# Patient Record
Sex: Male | Born: 1945 | Race: Black or African American | Hispanic: No | Marital: Married | Smoking: Former smoker
Health system: Southern US, Community
[De-identification: ages and names within clinical notes are randomized; demographics above are authoritative.]

## PROBLEM LIST (undated history)

## (undated) DIAGNOSIS — Z89511 Acquired absence of right leg below knee: Secondary | ICD-10-CM

## (undated) DIAGNOSIS — I639 Cerebral infarction, unspecified: Secondary | ICD-10-CM

## (undated) DIAGNOSIS — I10 Essential (primary) hypertension: Secondary | ICD-10-CM

## (undated) DIAGNOSIS — E119 Type 2 diabetes mellitus without complications: Secondary | ICD-10-CM

## (undated) DIAGNOSIS — N400 Enlarged prostate without lower urinary tract symptoms: Secondary | ICD-10-CM

## (undated) DIAGNOSIS — I4892 Unspecified atrial flutter: Secondary | ICD-10-CM

## (undated) DIAGNOSIS — F32A Depression, unspecified: Secondary | ICD-10-CM

## (undated) DIAGNOSIS — N183 Chronic kidney disease, stage 3 unspecified: Secondary | ICD-10-CM

## (undated) DIAGNOSIS — M1990 Unspecified osteoarthritis, unspecified site: Secondary | ICD-10-CM

## (undated) DIAGNOSIS — I499 Cardiac arrhythmia, unspecified: Secondary | ICD-10-CM

## (undated) DIAGNOSIS — C61 Malignant neoplasm of prostate: Secondary | ICD-10-CM

## (undated) DIAGNOSIS — E785 Hyperlipidemia, unspecified: Secondary | ICD-10-CM

## (undated) DIAGNOSIS — C189 Malignant neoplasm of colon, unspecified: Secondary | ICD-10-CM

## (undated) HISTORY — PX: APPENDECTOMY: SHX54

## (undated) HISTORY — PX: MINOR HEMORRHOIDECTOMY: SHX6238

---

## 2020-10-28 ENCOUNTER — Ambulatory Visit (INDEPENDENT_AMBULATORY_CARE_PROVIDER_SITE_OTHER): Payer: Medicare HMO

## 2020-10-28 ENCOUNTER — Ambulatory Visit (HOSPITAL_COMMUNITY)
Admission: EM | Admit: 2020-10-28 | Discharge: 2020-10-28 | Disposition: A | Discharge status: Home / Self Care | Payer: Medicare HMO | Attending: Physician Assistant | Admitting: Physician Assistant

## 2020-10-28 ENCOUNTER — Encounter (HOSPITAL_COMMUNITY): Payer: Self-pay | Admitting: Physician Assistant

## 2020-10-28 DIAGNOSIS — M25571 Pain in right ankle and joints of right foot: Secondary | ICD-10-CM

## 2020-10-28 MED ORDER — DICLOFENAC SODIUM 1 % EX GEL: 2.0000 g | Freq: Four times a day (QID) | CUTANEOUS | 0 refills | Status: DC

## 2020-10-28 NOTE — ED Triage Notes (Signed)
Pt presents with ankle pain and swelling X 4 days. Pt states he woke up one morning with pain in his ankle. He states he has had relief since applying ice.

## 2020-10-28 NOTE — Discharge Instructions (Addendum)
Follow up with PCP Use cream as directed Apply ice 15 minutes 4 times per day Stretch ankle daily.

## 2020-10-28 NOTE — ED Provider Notes (Signed)
[facility]    CSN: [id number] Arrival date & time: 10/28/20  1135      History   Chief Complaint Chief Complaint  Patient presents with   Ankle Pain    HPI Cory [name] is a 75 y.o. male.   Patient here c/w R ankle pain. PMH DM. He states it started after doing squats 2 days in a row.  No trauma to area.  Admits pain, tenderness, swelling, denies ecchymosis, n/t, weakness.  Denies history of gout.   History reviewed. No pertinent past medical history.  There are no problems to display for this patient.   History reviewed. No pertinent surgical history.     Home Medications    Prior to Admission medications   Medication Sig Start Date End Date Taking? Authorizing Provider  diclofenac Sodium (VOLTAREN) 1 % GEL Apply 2 g topically 4 (four) times daily. 10/28/20  Yes Evern Core, PA-C    Family History History reviewed. No pertinent family history.  Social History     Allergies   Patient has no known allergies.   Review of Systems Review of Systems  Constitutional:  Negative for chills, fatigue and fever.  Musculoskeletal:  Positive for arthralgias, gait problem, joint swelling and myalgias.  Skin:  Negative for color change, rash and wound.  Hematological:  Negative for adenopathy. Does not bruise/bleed easily.  Psychiatric/Behavioral:  Negative for confusion and sleep disturbance.     Physical Exam Triage Vital Signs ED Triage Vitals  Enc Vitals Group     BP 10/28/20 1235 (!) 147/88     Pulse Rate 10/28/20 1234 77     Resp 10/28/20 1234 17     Temp 10/28/20 1234 98.2 F (36.8 C)     Temp Source 10/28/20 1234 Oral     SpO2 10/28/20 1234 99 %     Weight --      Height --      Head Circumference --      Peak Flow --      Pain Score 10/28/20 1231 8     Pain Loc --      Pain Edu? --      Excl. in GC? --    No data found.  Updated Vital Signs BP (!) 147/88 (BP Location: Left Arm)   Pulse 77   Temp 98.2 F (36.8 C) (Oral)    Resp 17   SpO2 99%   Visual Acuity Right Eye Distance:   Left Eye Distance:   Bilateral Distance:    Right Eye Near:   Left Eye Near:    Bilateral Near:     Physical Exam Vitals and nursing note reviewed.  Constitutional:      Appearance: Normal appearance. He is well-developed.  HENT:     Head: Normocephalic and atraumatic.     Nose: Nose normal.     Mouth/Throat:     Mouth: Mucous membranes are moist.  Eyes:     General: No scleral icterus.    Extraocular Movements: Extraocular movements intact.     Conjunctiva/sclera: Conjunctivae normal.  Pulmonary:     Effort: Pulmonary effort is normal. No respiratory distress.  Musculoskeletal:     Cervical back: Normal range of motion and neck supple. No rigidity.     Right ankle: Swelling present. Tenderness present. Decreased range of motion.  Skin:    General: Skin is warm and dry.     Capillary Refill: Capillary refill takes less than 2 seconds.  Neurological:  General: No focal deficit present.     Mental Status: He is alert and oriented to person, place, and time.     Motor: No weakness.     Gait: Gait normal.  Psychiatric:        Mood and Affect: Mood normal.     UC Treatments / Results  Labs (all labs ordered are listed, but only abnormal results are displayed) Labs Reviewed - No data to display  EKG   Radiology DG Ankle Complete Right  Result Date: 10/28/2020 CLINICAL DATA:  Ankle pain, lateral aspect. Additional history provided: No injury, right ankle swelling and lateral pain for 4 days. EXAM: RIGHT ANKLE - COMPLETE 3+ VIEW COMPARISON:  No pertinent prior exams available for comparison. FINDINGS: There is normal bony alignment. No evidence of acute osseous or articular abnormality. The joint spaces are maintained. Calcaneal spur at the Achilles tendon insertion. Soft tissue swelling overlies the lateral malleolus. IMPRESSION: No evidence of acute osseous or articular abnormality. Soft tissue swelling  overlies the lateral malleolus. Electronically Signed   By: Jackey Loge DO   On: 10/28/2020 13:44    Procedures Procedures (including critical care time)  Medications Ordered in UC Medications - No data to display  Initial Impression / Assessment and Plan / UC Course  I have reviewed the triage vital signs and the nursing notes.  Pertinent labs & imaging results that were available during my care of the patient were reviewed by me and considered in my medical decision making (see chart for details).     Use ace wrap Use cream as directed Icee ankle 15 minutes 4 times per day  Final Clinical Impressions(s) / UC Diagnoses   Final diagnoses:  Acute right ankle pain     Discharge Instructions      Follow up with PCP Use cream as directed Apply ice 15 minutes 4 times per day Stretch ankle daily.     ED Prescriptions     Medication Sig Dispense Auth. Provider   diclofenac Sodium (VOLTAREN) 1 % GEL Apply 2 g topically 4 (four) times daily. 100 g Evern Core, PA-C      PDMP not reviewed this encounter.   Evern Core, PA-C 10/28/20 1407

## 2020-11-02 ENCOUNTER — Other Ambulatory Visit: Payer: Self-pay

## 2020-11-02 MED ORDER — TAMSULOSIN HCL 0.4 MG PO CAPS
ORAL_CAPSULE | ORAL | 3 refills | Status: DC
  Filled 2020-11-02: qty 30, 30d supply, fill #0

## 2020-11-02 MED ORDER — GLUCOSE BLOOD VI STRP
ORAL_STRIP | 12 refills | Status: DC
  Filled 2020-11-02 – 2020-11-03 (×2): qty 100, 90d supply, fill #0

## 2020-11-02 MED ORDER — BD PEN NEEDLE MINI U/F 31G X 5 MM MISC
3 refills | Status: DC
  Filled 2020-11-02: qty 100, 90d supply, fill #0

## 2020-11-02 MED ORDER — BLOOD GLUCOSE MONITOR SYSTEM W/DEVICE KIT
PACK | 0 refills | Status: DC
  Filled 2020-11-02: qty 1, 1d supply, fill #0

## 2020-11-02 MED ORDER — ACCU-CHEK SOFTCLIX LANCETS MISC
3 refills | Status: DC
  Filled 2020-11-02: qty 100, 90d supply, fill #0

## 2020-11-02 MED ORDER — FARXIGA 10 MG PO TABS
10.0000 mg | ORAL_TABLET | Freq: Every morning | ORAL | 1 refills | Status: DC
  Filled 2020-11-02: qty 30, 30d supply, fill #0

## 2020-11-02 MED ORDER — TRULICITY 0.75 MG/0.5ML ~~LOC~~ SOAJ
SUBCUTANEOUS | 0 refills | Status: DC
  Filled 2020-11-02: qty 2, 28d supply, fill #0

## 2020-11-03 ENCOUNTER — Other Ambulatory Visit: Payer: Self-pay

## 2020-11-12 ENCOUNTER — Other Ambulatory Visit: Payer: Self-pay

## 2020-11-12 ENCOUNTER — Encounter (HOSPITAL_COMMUNITY): Payer: Self-pay

## 2020-11-12 ENCOUNTER — Ambulatory Visit (INDEPENDENT_AMBULATORY_CARE_PROVIDER_SITE_OTHER): Payer: Medicare HMO

## 2020-11-12 ENCOUNTER — Ambulatory Visit (HOSPITAL_COMMUNITY)
Admission: EM | Admit: 2020-11-12 | Discharge: 2020-11-12 | Disposition: A | Discharge status: Home / Self Care | Payer: Medicare HMO | Attending: Family Medicine | Admitting: Family Medicine

## 2020-11-12 DIAGNOSIS — M79671 Pain in right foot: Secondary | ICD-10-CM | POA: Diagnosis not present

## 2020-11-12 DIAGNOSIS — R600 Localized edema: Secondary | ICD-10-CM | POA: Diagnosis not present

## 2020-11-12 DIAGNOSIS — R030 Elevated blood-pressure reading, without diagnosis of hypertension: Secondary | ICD-10-CM | POA: Diagnosis not present

## 2020-11-12 DIAGNOSIS — M25571 Pain in right ankle and joints of right foot: Secondary | ICD-10-CM

## 2020-11-12 MED ORDER — MISC. DEVICES MISC: 0 refills | Status: DC

## 2020-11-12 MED ORDER — FUROSEMIDE 20 MG PO TABS: ORAL_TABLET | ORAL | 0 refills | Status: DC

## 2020-11-12 NOTE — Discharge Instructions (Addendum)
Swelling happens when fluid collects in small spaces around tissues and organs inside the body. Another word for swelling is "edema." Some common parts of the body where people can have swelling are the lower legs or hands. This typically is worse in the areas of the body that are closest to the ground (because of gravity)  Symptoms of swelling can include puffiness of the skin, which can cause the skin to look stretched and shiny. This often occurs with swelling in the lower legs and can be worse after you sit or stand for a long time.  Treatment of edema includes several components: treatment of the underlying cause (if possible), reducing the amount of salt (sodium) in your diet, and, in many cases, use of a medication called a diuretic to eliminate excess fluid. Using compression stockings and elevating the legs may also be recommended.   Your blood pressure was noted to be elevated during your visit today. If you are currently taking medication for high blood pressure, please ensure you are taking this as directed. If you do not have a history of high blood pressure and your blood pressure remains persistently elevated, you may need to begin taking a medication at some point. You may return here within the next few days to recheck if unable to see your primary care provider or if you do not have a one.  BP (!) 183/91 (BP Location: Left Arm)   Pulse 85   Temp 98.7 F (37.1 C) (Oral)   Resp 18   SpO2 95%   BP Readings from Last 3 Encounters:  11/12/20 (!) 183/91  10/28/20 (!) 147/88

## 2020-11-12 NOTE — ED Triage Notes (Signed)
Pt presents with left ankle and leg pain x 1 week.   States the sxs have not improved.

## 2020-11-12 NOTE — ED Notes (Signed)
Ultrasound appt scheduled for 11/12/2020 at 3:00pm.

## 2020-11-12 NOTE — ED Provider Notes (Signed)
[facility]   [id number] 11/12/20 Arrival Time: 1057  ASSESSMENT & PLAN:  1. Right foot pain   2. Acute right ankle pain   3. Edema of right lower extremity   4. Elevated blood pressure reading in office without diagnosis of hypertension    Hesitant about getting U/S to r/o DVT. Lower suspicion but I emphasized importance of ruling this out. Agrees but will need for tomorrow so he can arrange transportation. No empiric Lovenox desired. Vascular imaging appt scheduled for tomorrow. Time and directions given before discharge. No CP/SOB. Agrees to ED f/u should he experience CP/SOB.  Declines Rx for walker. Prefers crutches to assist ambulation. Trial of Lasix as below.  Meds ordered this encounter  Medications   furosemide (LASIX) 20 MG tablet    Sig: Take 1-2 tablets in the morning for your ankle and foot swelling.    Dispense:  10 tablet    Refill:  0   DISCONTD: Misc. Devices MISC    Sig: Please dispense a walker to assist with ambulation.  Diagnosis:  Right foot pain. Right ankle pain. Right lower extremity edema.    Dispense:  1 each    Refill:  0      Discharge Instructions      Swelling happens when fluid collects in small spaces around tissues and organs inside the body. Another word for swelling is "edema." Some common parts of the body where people can have swelling are the lower legs or hands. This typically is worse in the areas of the body that are closest to the ground (because of gravity)  Symptoms of swelling can include puffiness of the skin, which can cause the skin to look stretched and shiny. This often occurs with swelling in the lower legs and can be worse after you sit or stand for a long time.  Treatment of edema includes several components: treatment of the underlying cause (if possible), reducing the amount of salt (sodium) in your diet, and, in many cases, use of a medication called a diuretic to eliminate excess fluid. Using compression  stockings and elevating the legs may also be recommended.   Your blood pressure was noted to be elevated during your visit today. If you are currently taking medication for high blood pressure, please ensure you are taking this as directed. If you do not have a history of high blood pressure and your blood pressure remains persistently elevated, you may need to begin taking a medication at some point. You may return here within the next few days to recheck if unable to see your primary care provider or if you do not have a one.  BP (!) 183/91 (BP Location: Left Arm)   Pulse 85   Temp 98.7 F (37.1 C) (Oral)   Resp 18   SpO2 95%   BP Readings from Last 3 Encounters:  11/12/20 (!) 183/91  10/28/20 (!) 147/88        Reviewed expectations re: course of current medical issues. Questions answered. Outlined signs and symptoms indicating need for more acute intervention. Patient verbalized understanding. After Visit Summary given.   SUBJECTIVE:  History from: patient. Cory [name] is a 75 y.o. male who presents with complaint of R ankle and foot pain; past week; now with swelling of RLE. Painful. Interfering with ambulation. No injury/trauma. No new medication. No h/o similar.  Social History   Tobacco Use  Smoking Status Never  Smokeless Tobacco Never   Social History   Substance and Sexual  Activity  Alcohol Use None   Increased blood pressure noted today. Reports that he is not treated for HTN. He reports no chest pain on exertion, no dyspnea on exertion, no orthostatic dizziness or lightheadedness, no orthopnea or paroxysmal nocturnal dyspnea, and no palpitations.    OBJECTIVE:  Vitals:   11/12/20 1249  BP: (!) 183/91  Pulse: 85  Resp: 18  Temp: 98.7 F (37.1 C)  TempSrc: Oral  SpO2: 95%    General appearance: alert, oriented, no acute distress Eyes: PERRLA; EOMI; conjunctivae normal HENT: normocephalic; atraumatic Neck: supple with FROM Lungs: without  labored respirations; speaks full sentences without difficulty Heart: regular Extremities: with edema of RLE, mostly around ankle foot but does extend proximally just above ankle; with TTP of calf and around ankle/foot Skin: warm and dry; without rash or lesions Psychological: alert and cooperative; normal mood and affect   Imaging: DG Ankle Complete Right  Result Date: 11/12/2020 CLINICAL DATA:  pain and swelling x 3 w EXAM: RIGHT ANKLE - COMPLETE 3+ VIEW COMPARISON:  Ankle radiograph 10/28/2020 FINDINGS: There is no evidence of acute fracture. There is ankle soft tissue swelling most prominent laterally. There is a small Achilles enthesophyte. IMPRESSION: Ankle soft tissue swelling most prominent laterally. No visible acute fracture. Electronically Signed   By: Caprice Renshaw   On: 11/12/2020 13:48   DG Foot Complete Right  Result Date: 11/12/2020 CLINICAL DATA:  Pain and swelling. EXAM: RIGHT FOOT COMPLETE - 3+ VIEW COMPARISON:  No prior. FINDINGS: Diffuse soft tissue swelling. No radiopaque foreign body. No acute bony or joint abnormality. No evidence of fracture or dislocation. IMPRESSION: Diffuse soft tissue swelling. No radiopaque foreign body. No acute bony abnormality. Electronically Signed   By: Maisie Fus  Register   On: 11/12/2020 13:48     No Known Allergies  History reviewed. No pertinent past medical history. Social History   Socioeconomic History   Marital status: Married    Spouse name: Not on file   Number of children: Not on file   Years of education: Not on file   Highest education level: Not on file  Occupational History   Not on file  Tobacco Use   Smoking status: Never   Smokeless tobacco: Never  Substance and Sexual Activity   Alcohol use: Not on file   Drug use: Not on file   Sexual activity: Not on file  Other Topics Concern   Not on file  Social History Narrative   Not on file   Social Determinants of Health   Financial Resource Strain: Not on file  Food  Insecurity: Not on file  Transportation Needs: Not on file  Physical Activity: Not on file  Stress: Not on file  Social Connections: Not on file  Intimate Partner Violence: Not on file   History reviewed. No pertinent family history. History reviewed. No pertinent surgical history.    Mardella Layman, MD 11/12/20 1434

## 2020-11-13 ENCOUNTER — Ambulatory Visit (HOSPITAL_COMMUNITY)
Admission: RE | Admit: 2020-11-13 | Discharge: 2020-11-13 | Disposition: A | Discharge status: Home / Self Care | Payer: Medicare HMO | Source: Ambulatory Visit | Attending: Family Medicine | Admitting: Family Medicine

## 2020-11-13 DIAGNOSIS — M79606 Pain in leg, unspecified: Secondary | ICD-10-CM | POA: Diagnosis present

## 2020-11-13 DIAGNOSIS — M79661 Pain in right lower leg: Secondary | ICD-10-CM | POA: Diagnosis not present

## 2020-11-13 DIAGNOSIS — M109 Gout, unspecified: Secondary | ICD-10-CM

## 2020-11-13 DIAGNOSIS — M7989 Other specified soft tissue disorders: Secondary | ICD-10-CM | POA: Diagnosis not present

## 2020-11-13 NOTE — Progress Notes (Signed)
Lower extremity venous has been completed.   Preliminary results in CV Proc.   Blanch Media 11/13/2020 3:54 PM

## 2020-11-17 ENCOUNTER — Emergency Department (HOSPITAL_COMMUNITY)
Admission: EM | Admit: 2020-11-17 | Discharge: 2020-11-18 | Disposition: A | Discharge status: Home / Self Care | Payer: Medicare HMO | Attending: Emergency Medicine | Admitting: Emergency Medicine

## 2020-11-17 DIAGNOSIS — M10071 Idiopathic gout, right ankle and foot: Secondary | ICD-10-CM | POA: Insufficient documentation

## 2020-11-17 DIAGNOSIS — Z79899 Other long term (current) drug therapy: Secondary | ICD-10-CM | POA: Insufficient documentation

## 2020-11-17 DIAGNOSIS — Z7984 Long term (current) use of oral hypoglycemic drugs: Secondary | ICD-10-CM | POA: Diagnosis not present

## 2020-11-17 DIAGNOSIS — E119 Type 2 diabetes mellitus without complications: Secondary | ICD-10-CM | POA: Diagnosis not present

## 2020-11-17 DIAGNOSIS — M109 Gout, unspecified: Secondary | ICD-10-CM

## 2020-11-17 DIAGNOSIS — I10 Essential (primary) hypertension: Secondary | ICD-10-CM | POA: Diagnosis not present

## 2020-11-17 DIAGNOSIS — Z794 Long term (current) use of insulin: Secondary | ICD-10-CM | POA: Diagnosis not present

## 2020-11-17 DIAGNOSIS — M25571 Pain in right ankle and joints of right foot: Secondary | ICD-10-CM | POA: Diagnosis present

## 2020-11-18 ENCOUNTER — Other Ambulatory Visit: Payer: Self-pay

## 2020-11-18 ENCOUNTER — Encounter (HOSPITAL_COMMUNITY): Payer: Self-pay | Admitting: Emergency Medicine

## 2020-11-18 LAB — CBC WITH DIFFERENTIAL/PLATELET
Abs Immature Granulocytes: 0.03 10*3/uL (ref 0.00–0.07)
Basophils Absolute: 0 10*3/uL (ref 0.0–0.1)
Basophils Relative: 0 %
Eosinophils Absolute: 0 10*3/uL (ref 0.0–0.5)
Eosinophils Relative: 1 %
HCT: 38.4 % — ABNORMAL LOW (ref 39.0–52.0)
Hemoglobin: 13.1 g/dL (ref 13.0–17.0)
Immature Granulocytes: 0 %
Lymphocytes Relative: 15 %
Lymphs Abs: 1.2 10*3/uL (ref 0.7–4.0)
MCH: 27.8 pg (ref 26.0–34.0)
MCHC: 34.1 g/dL (ref 30.0–36.0)
MCV: 81.4 fL (ref 80.0–100.0)
Monocytes Absolute: 0.9 10*3/uL (ref 0.1–1.0)
Monocytes Relative: 11 %
Neutro Abs: 5.9 10*3/uL (ref 1.7–7.7)
Neutrophils Relative %: 73 %
Platelets: 221 10*3/uL (ref 150–400)
RBC: 4.72 MIL/uL (ref 4.22–5.81)
RDW: 12.1 % (ref 11.5–15.5)
WBC: 8.1 10*3/uL (ref 4.0–10.5)
nRBC: 0 % (ref 0.0–0.2)

## 2020-11-18 LAB — BASIC METABOLIC PANEL
Anion gap: 15 (ref 5–15)
BUN: 16 mg/dL (ref 8–23)
CO2: 25 mmol/L (ref 22–32)
Calcium: 9 mg/dL (ref 8.9–10.3)
Chloride: 94 mmol/L — ABNORMAL LOW (ref 98–111)
Creatinine, Ser: 0.89 mg/dL (ref 0.61–1.24)
GFR, Estimated: >60 mL/min (ref >60)
Glucose, Bld: 170 mg/dL — ABNORMAL HIGH (ref 70–99)
Potassium: 3.2 mmol/L — ABNORMAL LOW (ref 3.5–5.1)
Sodium: 134 mmol/L — ABNORMAL LOW (ref 135–145)

## 2020-11-18 LAB — URIC ACID: Uric Acid, Serum: 3.4 mg/dL — ABNORMAL LOW (ref 3.7–8.6)

## 2020-11-18 MED ORDER — DEXAMETHASONE SODIUM PHOSPHATE 10 MG/ML IJ SOLN
10.0000 mg | Freq: Once | INTRAMUSCULAR | Status: AC
  Administered 2020-11-18: 10 mg via INTRAMUSCULAR
  Filled 2020-11-18: qty 1

## 2020-11-18 MED ORDER — COLCHICINE 0.6 MG PO TABS: 0.6000 mg | ORAL_TABLET | Freq: Two times a day (BID) | ORAL | 0 refills | Status: DC

## 2020-11-18 MED ORDER — PREDNISONE 20 MG PO TABS: 40.0000 mg | ORAL_TABLET | Freq: Every day | ORAL | 0 refills | Status: DC

## 2020-11-18 NOTE — ED Triage Notes (Signed)
Patient here with continued right leg swelling.  He has had multiple visits for same, with no relief of swelling.  Patient now having pain when walking.  Patient states that he had a venous doppler and does not know the results.  The foot continues with swelling, tightness of the foot and ankle.

## 2020-11-18 NOTE — Discharge Instructions (Addendum)
Your symptoms are likely related to gout, please read the attached instructions  The medication called colchicine which I have prescribed is to be taken as follows: 2 pills right away, then 1 pill every 12 hours until the medication is gone or until your pain goes away.  This may cause diarrhea  The steroids that we will give you are not going to help with some of your pain and swelling, they may also cause your blood sugar to go elevated for the next few days so make sure that you take your medications exactly as prescribed and follow a strict diabetic diet.  Return to the emergency department for fevers or severe or worsening pain or swelling, you must have your family doctor recheck you within 3 days.

## 2020-11-18 NOTE — ED Provider Notes (Signed)
Emergency Medicine Provider Triage Evaluation Note  Cory [name] , a 75 y.o. male  was evaluated in triage.  Pt complains of right ankle pain for about a week.  Has had negative x-rays and negative DVT studies.  Symptoms started after doing squats.  States he can't walk.  Review of Systems  Positive: Ankle pain, gait problem Negative: Fever  Physical Exam  BP (!) 145/80 (BP Location: Right Arm)   Pulse (!) 102   Temp 98.8 F (37.1 C) (Oral)   Resp 18   SpO2 100%  Gen:   Awake, no distress   Resp:  Normal effort  MSK:   Swelling about right ankle  Other:    Medical Decision Making  Medically screening exam initiated at 2:01 AM.  Appropriate orders placed.  Cory [name] was informed that the remainder of the evaluation will be completed by another provider, this initial triage assessment does not replace that evaluation, and the importance of remaining in the ED until their evaluation is complete.  Right ankle pain.   Roxy Horseman, PA-C 11/18/20 Herold Harms    Dione Booze, MD 11/18/20 [phone]

## 2020-11-18 NOTE — ED Provider Notes (Signed)
[facility] EMERGENCY DEPARTMENT Provider Note   CSN: [id number] Arrival date & time: 11/17/20  2200     History Chief Complaint  Patient presents with   Leg Swelling    Cory [name] is a 75 y.o. male.  HPI  This patient is a 75 year old male, he has a history of hypertension for which he takes multiple medications, he has diabetes taking metformin and Trulicity, he also takes atorvastatin.  According to the medical record the patient has had multiple visits for pain in his right ankle including urgent care on 22 June, on July 7 he was also seen in urgent care for the same thing, it has not gotten any better and he remains in discomfort and presents again today.  He had x-rays performed on July 7 showing some lateral soft tissue swelling, there is no fractures of the ankle or the foot.  A vascular ultrasound performed on July 9 was also obtained showing no evidence of DVT on the right and no common femoral vein obstruction on the left.  The pain continues he is unable to ambulate, he states it is swollen tender to the touch and tender with movement.  He denies fevers chills nausea vomiting diarrhea coughing shortness of breath chest pain abdominal pain or any other complaints.  He reports he has been taking no medications for this prior to arrival.  He tells me that the history behind this is that about a month ago he had been doing his evening exercises which she does every night including some squats, he was doing totally normal but when he woke up the next morning he had developed a pain overnight in his right ankle, it is only worsened since that time with warmth of the joint, no other joints are involved.  History reviewed. No pertinent past medical history.  There are no problems to display for this patient.   History reviewed. No pertinent surgical history.     History reviewed. No pertinent family history.  Social History   Tobacco Use   Smoking status:  Never   Smokeless tobacco: Never    Home Medications Prior to Admission medications   Medication Sig Start Date End Date Taking? Authorizing Provider  Accu-Chek Softclix Lancets lancets USE AS directed to test blood sugar daily 11/02/20  Yes   acetaminophen (TYLENOL) 500 MG tablet Take 1,000 mg by mouth every 6 (six) hours as needed for moderate pain or headache.   Yes [provider]  atorvastatin (LIPITOR) 20 MG tablet Take 20 mg by mouth at bedtime. 09/14/20  Yes [provider]  Blood Glucose Monitoring Suppl (BLOOD GLUCOSE MONITOR SYSTEM) w/Device KIT 1 (one) Kit as directed 11/02/20  Yes   colchicine 0.6 MG tablet Take 1 tablet (0.6 mg total) by mouth 2 (two) times daily for 7 days. 11/18/20 11/25/20 Yes Noemi Chapel, MD  dapagliflozin propanediol (FARXIGA) 10 MG TABS tablet Take 1 tablet by mouth every morning 11/02/20  Yes   Dulaglutide (TRULICITY) 7.82 NF/6.2ZH SOPN 1 (one) Solution Pen-injector under skin once a week Patient taking differently: Inject 0.75 mg into the skin every Monday. 11/02/20  Yes   glucose blood test strip use as directed to test blood sugar daily 11/02/20  Yes   Insulin Pen Needle (B-D UF III MINI PEN NEEDLES) 31G X 5 MM MISC 1 (one) Each daily 11/02/20  Yes   LANTUS SOLOSTAR 100 UNIT/ML Solostar Pen Inject 45 Units into the skin daily. 11/11/20  Yes [provider]  losartan-hydrochlorothiazide (HYZAAR) 100-25 MG tablet Take 1 tablet by mouth daily. 09/14/20  Yes [provider]  metFORMIN (GLUCOPHAGE) 1000 MG tablet Take 1,000 mg by mouth 2 (two) times daily. 06/15/20  Yes [provider]  metoprolol succinate (TOPROL-XL) 100 MG 24 hr tablet Take 100 mg by mouth daily. 11/09/20  Yes [provider]  NIFEdipine (PROCARDIA-XL/NIFEDICAL-XL) 30 MG 24 hr tablet Take 30 mg by mouth daily. 09/16/20  Yes [provider]  predniSONE (DELTASONE) 20 MG tablet Take 2 tablets (40 mg total) by mouth daily. 11/18/20  Yes Noemi Chapel, MD  tamsulosin (FLOMAX) 0.4 MG CAPS capsule 1 Capsule by mouth daily Patient taking differently: Take 0.4 mg by mouth at bedtime. 11/02/20  Yes   diclofenac Sodium (VOLTAREN) 1 % GEL Apply 2 g topically 4 (four) times daily. Patient not taking: No sig reported 10/28/20   Peri Jefferson, PA-C  furosemide (LASIX) 20 MG tablet Take 1-2 tablets in the morning for your ankle and foot swelling. Patient not taking: No sig reported 11/12/20   Vanessa Kick, MD    Allergies    Patient has no known allergies.  Review of Systems   Review of Systems  All other systems reviewed and are negative.  Physical Exam Updated Vital Signs BP (!) 156/98   Pulse 95   Temp 98.9 F (37.2 C) (Oral)   Resp 17   SpO2 98%   Physical Exam Vitals and nursing note reviewed.  Constitutional:      General: He is not in acute distress.    Appearance: He is well-developed.  HENT:     Head: Normocephalic and atraumatic.     Mouth/Throat:     Pharynx: No oropharyngeal exudate.  Eyes:     General: No scleral icterus.       Right eye: No discharge.        Left eye: No discharge.     Conjunctiva/sclera: Conjunctivae normal.     Pupils: Pupils are equal, round, and reactive to light.  Neck:     Thyroid: No thyromegaly.     Vascular: No JVD.  Cardiovascular:     Rate and Rhythm: Normal rate and regular rhythm.     Heart sounds: Normal heart sounds. No murmur heard.   No friction rub. No gallop.  Pulmonary:     Effort: Pulmonary effort is normal. No respiratory distress.     Breath sounds: Normal breath sounds. No wheezing or rales.  Abdominal:     General: Bowel sounds are normal. There is no distension.     Palpations: Abdomen is soft. There is no mass.     Tenderness: There is no abdominal tenderness.  Musculoskeletal:        General: Swelling and tenderness present.     Cervical back: Normal range of motion and neck supple.     Comments: The patient has decreased range of motion of the right  ankle secondary to pain, there is tenderness with any movement of the right ankle joint, he has normal pulses at the feet bilaterally.  All other extremities have a totally normal exam.  The right leg has normal range of motion at the right hip and the right knee, the right ankle has decreased range of motion.  He is able to move all 5 of the toes of the right foot without difficulty.  The right ankle is slightly swollen and warm to the touch, there is no overlying rash  Lymphadenopathy:     Cervical:  No cervical adenopathy.  Skin:    General: Skin is warm and dry.     Findings: No erythema or rash.  Neurological:     Mental Status: He is alert.     Coordination: Coordination normal.  Psychiatric:        Behavior: Behavior normal.    ED Results / Procedures / Treatments   Labs (all labs ordered are listed, but only abnormal results are displayed) Labs Reviewed  URIC ACID - Abnormal; Notable for the following components:      Result Value   Uric Acid, Serum 3.4 (*)    All other components within normal limits  CBC WITH DIFFERENTIAL/PLATELET - Abnormal; Notable for the following components:   HCT 38.4 (*)    All other components within normal limits  BASIC METABOLIC PANEL - Abnormal; Notable for the following components:   Sodium 134 (*)    Potassium 3.2 (*)    Chloride 94 (*)    Glucose, Bld 170 (*)    All other components within normal limits    EKG None  Radiology No results found.  Procedures Procedures   Medications Ordered in ED Medications  dexamethasone (DECADRON) injection 10 mg (has no administration in time range)    ED Course  I have reviewed the triage vital signs and the nursing notes.  Pertinent labs & imaging results that were available during my care of the patient were reviewed by me and considered in my medical decision making (see chart for details).    MDM Rules/Calculators/A&P                          The patient has an acute atraumatic  monoarticular arthritis, would consider several things in the differential including acute gout which is the most likely answer, this might also be related to a septic joint though this seems much less likely in the absence of fever tachycardia or leukocytosis.  The patient is a diabetic and unfortunately steroids may have some hyperglycemic effects but at this time with his significant swelling and inability to put any weight on it we will give him a course of steroids, he will also be placed on colchicine and will have him follow-up with family doctor.  Final Clinical Impression(s) / ED Diagnoses Final diagnoses:  Acute gout of right ankle, unspecified cause    Rx / DC Orders ED Discharge Orders          Ordered    colchicine 0.6 MG tablet  2 times daily        11/18/20 0744    predniSONE (DELTASONE) 20 MG tablet  Daily        11/18/20 0744             Noemi Chapel, MD 11/18/20 0745

## 2020-11-22 ENCOUNTER — Encounter (HOSPITAL_COMMUNITY): Payer: Self-pay | Admitting: Emergency Medicine

## 2020-11-22 ENCOUNTER — Emergency Department (HOSPITAL_COMMUNITY)
Admission: EM | Admit: 2020-11-22 | Discharge: 2020-11-22 | Disposition: A | Discharge status: Home / Self Care | Payer: Medicare HMO | Attending: Emergency Medicine | Admitting: Emergency Medicine

## 2020-11-22 ENCOUNTER — Emergency Department (HOSPITAL_COMMUNITY): Payer: Medicare HMO

## 2020-11-22 ENCOUNTER — Other Ambulatory Visit: Payer: Self-pay

## 2020-11-22 DIAGNOSIS — M25571 Pain in right ankle and joints of right foot: Secondary | ICD-10-CM | POA: Diagnosis present

## 2020-11-22 DIAGNOSIS — L03115 Cellulitis of right lower limb: Secondary | ICD-10-CM | POA: Diagnosis not present

## 2020-11-22 DIAGNOSIS — Z79899 Other long term (current) drug therapy: Secondary | ICD-10-CM | POA: Insufficient documentation

## 2020-11-22 DIAGNOSIS — Z794 Long term (current) use of insulin: Secondary | ICD-10-CM | POA: Diagnosis not present

## 2020-11-22 DIAGNOSIS — E119 Type 2 diabetes mellitus without complications: Secondary | ICD-10-CM | POA: Diagnosis not present

## 2020-11-22 DIAGNOSIS — I10 Essential (primary) hypertension: Secondary | ICD-10-CM | POA: Insufficient documentation

## 2020-11-22 LAB — COMPREHENSIVE METABOLIC PANEL
ALT: 17 U/L (ref 0–44)
AST: 16 U/L (ref 15–41)
Albumin: 3.1 g/dL — ABNORMAL LOW (ref 3.5–5.0)
Alkaline Phosphatase: 72 U/L (ref 38–126)
Anion gap: 8 (ref 5–15)
BUN: 16 mg/dL (ref 8–23)
CO2: 29 mmol/L (ref 22–32)
Calcium: 9.2 mg/dL (ref 8.9–10.3)
Chloride: 92 mmol/L — ABNORMAL LOW (ref 98–111)
Creatinine, Ser: 1.1 mg/dL (ref 0.61–1.24)
GFR, Estimated: >60 mL/min (ref >60)
Glucose, Bld: 326 mg/dL — ABNORMAL HIGH (ref 70–99)
Potassium: 4 mmol/L (ref 3.5–5.1)
Sodium: 129 mmol/L — ABNORMAL LOW (ref 135–145)
Total Bilirubin: 0.6 mg/dL (ref 0.3–1.2)
Total Protein: 8.1 g/dL (ref 6.5–8.1)

## 2020-11-22 LAB — CBC WITH DIFFERENTIAL/PLATELET
Abs Immature Granulocytes: 0.07 10*3/uL (ref 0.00–0.07)
Basophils Absolute: 0 10*3/uL (ref 0.0–0.1)
Basophils Relative: 0 %
Eosinophils Absolute: 0 10*3/uL (ref 0.0–0.5)
Eosinophils Relative: 0 %
HCT: 42.5 % (ref 39.0–52.0)
Hemoglobin: 14.4 g/dL (ref 13.0–17.0)
Immature Granulocytes: 1 %
Lymphocytes Relative: 12 %
Lymphs Abs: 1.1 10*3/uL (ref 0.7–4.0)
MCH: 27.5 pg (ref 26.0–34.0)
MCHC: 33.9 g/dL (ref 30.0–36.0)
MCV: 81.3 fL (ref 80.0–100.0)
Monocytes Absolute: 1 10*3/uL (ref 0.1–1.0)
Monocytes Relative: 11 %
Neutro Abs: 7 10*3/uL (ref 1.7–7.7)
Neutrophils Relative %: 76 %
Platelets: 251 10*3/uL (ref 150–400)
RBC: 5.23 MIL/uL (ref 4.22–5.81)
RDW: 12 % (ref 11.5–15.5)
WBC: 9.2 10*3/uL (ref 4.0–10.5)
nRBC: 0 % (ref 0.0–0.2)

## 2020-11-22 LAB — URIC ACID: Uric Acid, Serum: 3.2 mg/dL — ABNORMAL LOW (ref 3.7–8.6)

## 2020-11-22 LAB — SEDIMENTATION RATE: Sed Rate: 57 mm/hr — ABNORMAL HIGH (ref 0–16)

## 2020-11-22 LAB — C-REACTIVE PROTEIN: CRP: 8.7 mg/dL — ABNORMAL HIGH (ref <1.0)

## 2020-11-22 MED ORDER — MORPHINE SULFATE (PF) 4 MG/ML IV SOLN
4.0000 mg | Freq: Once | INTRAVENOUS | Status: AC
  Administered 2020-11-22: 4 mg via INTRAVENOUS
  Filled 2020-11-22: qty 1

## 2020-11-22 MED ORDER — HYDROCODONE-ACETAMINOPHEN 5-325 MG PO TABS: 1.0000 | ORAL_TABLET | Freq: Four times a day (QID) | ORAL | 0 refills | Status: DC | PRN

## 2020-11-22 MED ORDER — IOHEXOL 300 MG/ML  SOLN: 100.0000 mL | Freq: Once | INTRAMUSCULAR | Status: DC | PRN

## 2020-11-22 MED ORDER — SULFAMETHOXAZOLE-TRIMETHOPRIM 800-160 MG PO TABS: 1.0000 | ORAL_TABLET | Freq: Two times a day (BID) | ORAL | 0 refills | Status: DC

## 2020-11-22 MED ORDER — VANCOMYCIN HCL 1500 MG/300ML IV SOLN
1500.0000 mg | Freq: Once | INTRAVENOUS | Status: AC
  Administered 2020-11-22: 1500 mg via INTRAVENOUS
  Filled 2020-11-22: qty 300

## 2020-11-22 MED ORDER — LIDOCAINE HCL 2 % IJ SOLN
10.0000 mL | Freq: Once | INTRAMUSCULAR | Status: AC
  Administered 2020-11-22: 200 mg
  Filled 2020-11-22: qty 20

## 2020-11-22 MED ORDER — CEPHALEXIN 500 MG PO CAPS: 500.0000 mg | ORAL_CAPSULE | Freq: Three times a day (TID) | ORAL | 0 refills | Status: DC

## 2020-11-22 MED ORDER — IOHEXOL 300 MG/ML  SOLN
100.0000 mL | Freq: Once | INTRAMUSCULAR | Status: AC | PRN
  Administered 2020-11-22: 100 mL via INTRAVENOUS

## 2020-11-22 NOTE — ED Notes (Signed)
Pt states he was exercising around Father's day and he woke up the net day and his right ankle and foot was swollen. Pt has 2+ swelling of right ankle and foot, 2+ right pedal pulse, cap refill less than 3 sec, warm to touch, able to wiggle toes.

## 2020-11-22 NOTE — ED Provider Notes (Signed)
[facility] EMERGENCY DEPARTMENT Provider Note   CSN: [id number] Arrival date & time: 11/22/20  1505     History Chief Complaint  Patient presents with   Leg Pain    Cory [name] is a 75 y.o. male.   Leg Pain  75 year old male PMHx HTN, DM, presenting for right ankle pain.  Pain is located on diffuse ankle radiating up right calf, onset 1 month ago in context of exercising heavily the previous day, worsening, and now 10/10 severity, sharp quality, worsened with movement and standing.  Associate symptoms include overlying edema and warmth.  Has been using crutches to ambulate as he has difficulty bearing weight.  No further medical concerns time occluding fevers, chills, diaphoresis, CP, SOB, palpitations, cough, N/V, abdominal pain, bowel/bladder changes, focal paresthesias/weakness, syncope.  History obtained from patient and chart review.  History reviewed. No pertinent past medical history.  There are no problems to display for this patient.   History reviewed. No pertinent surgical history.     No family history on file.  Social History   Tobacco Use   Smoking status: Never   Smokeless tobacco: Never    Home Medications Prior to Admission medications   Medication Sig Start Date End Date Taking? Authorizing Provider  cephALEXin (KEFLEX) 500 MG capsule Take 1 capsule (500 mg total) by mouth 3 (three) times daily for 5 days. 11/22/20 11/27/20 Yes Kamiryn Bezanson, Hughes Better, MD  HYDROcodone-acetaminophen (NORCO/VICODIN) 5-325 MG tablet Take 1 tablet by mouth every 6 (six) hours as needed for up to 10 doses for severe pain. 11/22/20  Yes Vannary Greening, MD  sulfamethoxazole-trimethoprim (BACTRIM DS) 800-160 MG tablet Take 1 tablet by mouth 2 (two) times daily for 5 days. 11/22/20 11/27/20 Yes Jaylyn Booher, Hughes Better, MD  Accu-Chek Softclix Lancets lancets USE AS directed to test blood sugar daily 11/02/20     acetaminophen (TYLENOL) 500 MG tablet Take  1,000 mg by mouth every 6 (six) hours as needed for moderate pain or headache.    [provider]  atorvastatin (LIPITOR) 20 MG tablet Take 20 mg by mouth at bedtime. 09/14/20   [provider]  Blood Glucose Monitoring Suppl (BLOOD GLUCOSE MONITOR SYSTEM) w/Device KIT 1 (one) Kit as directed 11/02/20     colchicine 0.6 MG tablet Take 1 tablet (0.6 mg total) by mouth 2 (two) times daily for 7 days. 11/18/20 11/25/20  Noemi Chapel, MD  dapagliflozin propanediol (FARXIGA) 10 MG TABS tablet Take 1 tablet by mouth every morning 11/02/20     diclofenac Sodium (VOLTAREN) 1 % GEL Apply 2 g topically 4 (four) times daily. Patient not taking: No sig reported 10/28/20   Peri Jefferson, PA-C  Dulaglutide (TRULICITY) 6.71 IW/5.8KD SOPN 1 (one) Solution Pen-injector under skin once a week Patient taking differently: Inject 0.75 mg into the skin every Monday. 11/02/20     furosemide (LASIX) 20 MG tablet Take 1-2 tablets in the morning for your ankle and foot swelling. Patient not taking: No sig reported 11/12/20   Vanessa Kick, MD  glucose blood test strip use as directed to test blood sugar daily 11/02/20     Insulin Pen Needle (B-D UF III MINI PEN NEEDLES) 31G X 5 MM MISC 1 (one) Each daily 11/02/20     LANTUS SOLOSTAR 100 UNIT/ML Solostar Pen Inject 45 Units into the skin daily. 11/11/20   [provider]  losartan-hydrochlorothiazide (HYZAAR) 100-25 MG tablet Take 1 tablet by mouth daily. 09/14/20   [provider]  metFORMIN (GLUCOPHAGE) 1000 MG  tablet Take 1,000 mg by mouth 2 (two) times daily. 06/15/20   [provider]  metoprolol succinate (TOPROL-XL) 100 MG 24 hr tablet Take 100 mg by mouth daily. 11/09/20   [provider]  NIFEdipine (PROCARDIA-XL/NIFEDICAL-XL) 30 MG 24 hr tablet Take 30 mg by mouth daily. 09/16/20   [provider]  predniSONE (DELTASONE) 20 MG tablet Take 2 tablets (40 mg total) by mouth daily. 11/18/20   Noemi Chapel, MD  tamsulosin  (FLOMAX) 0.4 MG CAPS capsule 1 Capsule by mouth daily Patient taking differently: Take 0.4 mg by mouth at bedtime. 11/02/20       Allergies    Patient has no known allergies.  Review of Systems   Review of Systems  All other systems reviewed and are negative.  Physical Exam Updated Vital Signs BP (!) 129/113   Pulse 80   Temp 97.6 F (36.4 C) (Oral)   Resp 18   SpO2 98%   Physical Exam Vitals and nursing note reviewed.  Constitutional:      General: He is not in acute distress. HENT:     Head: Normocephalic and atraumatic.  Eyes:     Extraocular Movements: Extraocular movements intact.     Conjunctiva/sclera: Conjunctivae normal.  Cardiovascular:     Rate and Rhythm: Normal rate and regular rhythm.  Pulmonary:     Effort: Pulmonary effort is normal. No respiratory distress.  Abdominal:     Palpations: Abdomen is soft.     Tenderness: There is no abdominal tenderness.  Musculoskeletal:     Cervical back: Normal range of motion. No rigidity.     Left lower leg: No edema.     Comments: Left ankle diffusely edematous with possible posterior lateral effusion, warm to touch.  Significant TTP and with ranging.  No obvious erythema or induration.  Compartments soft.  No obvious ecchymosis, deformity, or crepitus.  Neurovascularly intact distally.  Skin:    General: Skin is warm and dry.  Neurological:     Mental Status: He is alert and oriented to person, place, and time. Mental status is at baseline.  Psychiatric:        Mood and Affect: Mood normal.        Behavior: Behavior normal.    ED Results / Procedures / Treatments   Labs (all labs ordered are listed, but only abnormal results are displayed) Labs Reviewed  COMPREHENSIVE METABOLIC PANEL - Abnormal; Notable for the following components:      Result Value   Sodium 129 (*)    Chloride 92 (*)    Glucose, Bld 326 (*)    Albumin 3.1 (*)    All other components within normal limits  SEDIMENTATION RATE - Abnormal;  Notable for the following components:   Sed Rate 57 (*)    All other components within normal limits  C-REACTIVE PROTEIN - Abnormal; Notable for the following components:   CRP 8.7 (*)    All other components within normal limits  URIC ACID - Abnormal; Notable for the following components:   Uric Acid, Serum 3.2 (*)    All other components within normal limits  BODY FLUID CULTURE W GRAM STAIN  CBC WITH DIFFERENTIAL/PLATELET    EKG None  Radiology CT ANKLE RIGHT W CONTRAST  Result Date: 11/22/2020 CLINICAL DATA:  Right ankle swelling EXAM: CT OF THE RIGHT ANKLE WITH CONTRAST TECHNIQUE: Multidetector CT imaging of the right ankle was performed following the standard protocol during bolus administration of intravenous contrast. CONTRAST:  100 cc Omnipaque 350 COMPARISON:  11/12/2020 FINDINGS: Bones/Joint/Cartilage No acute or destructive bony lesions. 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 Small superior calcaneal spur. Ligaments Suboptimally assessed by CT. Muscles and Tendons No gross abnormality. If there is suspicion for intrinsic muscular or tendinous pathology, follow-up MRI could be considered. Soft tissues There is diffuse subcutaneous edema about the right ankle and hindfoot. No evidence of fluid collection or abscess. Vascular structures appear to enhance normally. Reconstructed images demonstrate no additional findings. IMPRESSION: 1. Diffuse subcutaneous edema surrounding the right ankle and hindfoot, which could reflect cellulitis. No fluid collection or abscess. 2. No acute bony abnormality. 3. 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 Electronically Signed   By: Randa Ngo M.D.   On: 11/22/2020 20:50    Procedures .Joint Aspiration/Arthrocentesis  Date/Time: 11/22/2020 5:49 PM Performed by: Levin Bacon, MD Authorized by: Drenda Freeze, MD   Consent:    Consent obtained:  Verbal   Consent given by:  Patient   Risks, benefits, and  alternatives were discussed: yes     Risks discussed:  Bleeding, infection and pain   Alternatives discussed:  No treatment Universal protocol:    Procedure explained and questions answered to patient or proxy's satisfaction: yes     Patient identity confirmed:  Arm band Location:    Location:  Ankle   Ankle:  R ankle Anesthesia:    Anesthesia method:  Local infiltration   Local anesthetic:  Lidocaine 2% WITH epi Procedure details:    Preparation: Patient was prepped and draped in usual sterile fashion     Needle gauge:  18 G   Ultrasound guidance: yes     Approach:  Lateral   Aspirate amount:  0   Steroid injected: no     Specimen collected: no   Post-procedure details:    Dressing:  Gauze roll   Procedure completion:  Tolerated well, no immediate complications   Medications Ordered in ED Medications  lidocaine (XYLOCAINE) 2 % (with pres) injection 200 mg (200 mg Infiltration Given by Other 11/22/20 1900)  morphine 4 MG/ML injection 4 mg (4 mg Intravenous Given 11/22/20 1731)  vancomycin (VANCOREADY) IVPB 1500 mg/300 mL (0 mg Intravenous Stopped 11/22/20 2214)  iohexol (OMNIPAQUE) 300 MG/ML solution 100 mL (100 mLs Intravenous Contrast Given 11/22/20 1825)    ED Course  I have reviewed the triage vital signs and the nursing notes.  Pertinent labs & imaging results that were available during my care of the patient were reviewed by me and considered in my medical decision making (see chart for details).    MDM Rules/Calculators/A&P                          This is a 75 year old male PMHx T2DM with worsening ankle pain over the past month, now warm, tender, swollen with difficulty weightbearing.  VSS, AF.  He was evaluated recently, thought to have gout, and started on prednisone and colchicine.  Initial interventions: Morphine provided for pain control, attempted joint tap as above, but unable to draw back any aspirate  DDx included: Compartment syndrome, traumatic injury,  osteoarthritis, crystal arthropathy, septic arthritis, osteomyelitis, soft tissue infection, DVT, venous stasis, lymphedema, radiculopathy, neuropathy, metabolic derangement, malignancy, autoimmune disease  All studies independently reviewed by myself, d/w the attending physician, factored into my MDM. -CT right ankle with contrast: Diffuse subcutaneous edema surrounding right ankle and hindfoot likely reflecting cellulitis -CMP: Mild hyponatremia (129) in context of  BGL 326, normal anion gap -ESR: 57 -CRP: 8.7 -Uric acid: 3.2 (3.4) -Unremarkable: CBCd  Presentation appears most consistent with arthralgia with overlying cellulitis.  Mildly elevated inflammatory markers, will warm arthralgias, concerning for possible septic arthritis or rheumatologic disease, although no systemic signs or symptoms, no tappable joint effusion with regard to former.  Despite uric acid elevation, has failed treatment for gout. Compartments soft, no signs of trauma on exam.   No evidence of osteomyelitis, significant osteoarthritis, or changes consistent with malignancy on CT imaging.  Presentation inconsistent with DVT, venous stasis, lymphedema.  Unlikely neurologic etiology.  No significant metabolic derangements noted.  Potentially autoimmune disease in context of elevated inflammatory markers and localized swelling, although he has no prior history thereof.  Therefore, feel the patient stable for discharge home with close outpatient follow-up including instructions to schedule appoint with orthopedic surgery first thing Monday morning.  He was given 1 round of vancomycin in the ED and will send home on Keflex and Bactrim to treat his cellulitis.  Strict return precautions discussed.  He understands and agrees with this plan.  Patient anxious on reevaluation, ambulating well with crutches, nontoxic-appearing.  Subsequently discharged.  Final Clinical Impression(s) / ED Diagnoses Final diagnoses:  Cellulitis of right  lower extremity    Rx / DC Orders ED Discharge Orders          Ordered    HYDROcodone-acetaminophen (NORCO/VICODIN) 5-325 MG tablet  Every 6 hours PRN        11/22/20 2217    sulfamethoxazole-trimethoprim (BACTRIM DS) 800-160 MG tablet  2 times daily        11/22/20 2217    cephALEXin (KEFLEX) 500 MG capsule  3 times daily        11/22/20 2217             Levin Bacon, MD 11/23/20 0533    Drenda Freeze, MD 11/25/20 [phone]

## 2020-11-22 NOTE — ED Notes (Signed)
Patient transported to CT 

## 2020-11-22 NOTE — ED Triage Notes (Signed)
Patient coming from home. Complaint of leg pain and inability to walk for an extended period of time.

## 2020-11-22 NOTE — Discharge Instructions (Addendum)
Your evaluated in the ER for leg pain.  We performed a CT of your ankle that showed evidence of a skin infection.  She take the antibiotics as prescribed.  Please return to the ER for any worsening.  You should use the number above to follow-up with orthopedic surgery in the next week.  Call them tomorrow morning to set up an appointment.

## 2020-11-26 ENCOUNTER — Inpatient Hospital Stay (HOSPITAL_COMMUNITY)
Admission: EM | Admit: 2020-11-26 | Discharge: 2020-12-08 | DRG: 854 | Disposition: A | Discharge status: Skilled Nursing Facility | Payer: Medicare HMO | Attending: Student | Admitting: Student

## 2020-11-26 ENCOUNTER — Encounter (HOSPITAL_COMMUNITY): Payer: Self-pay | Admitting: Emergency Medicine

## 2020-11-26 ENCOUNTER — Emergency Department (HOSPITAL_COMMUNITY): Payer: Medicare HMO

## 2020-11-26 ENCOUNTER — Other Ambulatory Visit: Payer: Self-pay

## 2020-11-26 DIAGNOSIS — M86071 Acute hematogenous osteomyelitis, right ankle and foot: Secondary | ICD-10-CM | POA: Diagnosis not present

## 2020-11-26 DIAGNOSIS — E871 Hypo-osmolality and hyponatremia: Secondary | ICD-10-CM | POA: Diagnosis not present

## 2020-11-26 DIAGNOSIS — Z89511 Acquired absence of right leg below knee: Secondary | ICD-10-CM

## 2020-11-26 DIAGNOSIS — E785 Hyperlipidemia, unspecified: Secondary | ICD-10-CM | POA: Diagnosis present

## 2020-11-26 DIAGNOSIS — Z20822 Contact with and (suspected) exposure to covid-19: Secondary | ICD-10-CM | POA: Diagnosis not present

## 2020-11-26 DIAGNOSIS — Z794 Long term (current) use of insulin: Secondary | ICD-10-CM

## 2020-11-26 DIAGNOSIS — E440 Moderate protein-calorie malnutrition: Secondary | ICD-10-CM | POA: Diagnosis not present

## 2020-11-26 DIAGNOSIS — E663 Overweight: Secondary | ICD-10-CM | POA: Diagnosis present

## 2020-11-26 DIAGNOSIS — E876 Hypokalemia: Secondary | ICD-10-CM | POA: Diagnosis not present

## 2020-11-26 DIAGNOSIS — A419 Sepsis, unspecified organism: Secondary | ICD-10-CM | POA: Diagnosis not present

## 2020-11-26 DIAGNOSIS — L03115 Cellulitis of right lower limb: Secondary | ICD-10-CM | POA: Diagnosis not present

## 2020-11-26 DIAGNOSIS — N400 Enlarged prostate without lower urinary tract symptoms: Secondary | ICD-10-CM | POA: Diagnosis present

## 2020-11-26 DIAGNOSIS — I10 Essential (primary) hypertension: Secondary | ICD-10-CM | POA: Diagnosis present

## 2020-11-26 DIAGNOSIS — Z79899 Other long term (current) drug therapy: Secondary | ICD-10-CM | POA: Diagnosis not present

## 2020-11-26 DIAGNOSIS — M86271 Subacute osteomyelitis, right ankle and foot: Secondary | ICD-10-CM | POA: Diagnosis not present

## 2020-11-26 DIAGNOSIS — L0390 Cellulitis, unspecified: Secondary | ICD-10-CM | POA: Diagnosis not present

## 2020-11-26 DIAGNOSIS — R739 Hyperglycemia, unspecified: Secondary | ICD-10-CM

## 2020-11-26 DIAGNOSIS — N179 Acute kidney failure, unspecified: Secondary | ICD-10-CM | POA: Diagnosis not present

## 2020-11-26 DIAGNOSIS — E1140 Type 2 diabetes mellitus with diabetic neuropathy, unspecified: Secondary | ICD-10-CM | POA: Diagnosis present

## 2020-11-26 DIAGNOSIS — R6521 Severe sepsis with septic shock: Secondary | ICD-10-CM | POA: Diagnosis present

## 2020-11-26 DIAGNOSIS — L97419 Non-pressure chronic ulcer of right heel and midfoot with unspecified severity: Secondary | ICD-10-CM | POA: Diagnosis present

## 2020-11-26 DIAGNOSIS — Z6829 Body mass index (BMI) 29.0-29.9, adult: Secondary | ICD-10-CM | POA: Diagnosis not present

## 2020-11-26 DIAGNOSIS — E1169 Type 2 diabetes mellitus with other specified complication: Secondary | ICD-10-CM | POA: Diagnosis present

## 2020-11-26 DIAGNOSIS — Z833 Family history of diabetes mellitus: Secondary | ICD-10-CM | POA: Diagnosis not present

## 2020-11-26 DIAGNOSIS — Z7984 Long term (current) use of oral hypoglycemic drugs: Secondary | ICD-10-CM

## 2020-11-26 DIAGNOSIS — E7800 Pure hypercholesterolemia, unspecified: Secondary | ICD-10-CM | POA: Diagnosis present

## 2020-11-26 DIAGNOSIS — E1165 Type 2 diabetes mellitus with hyperglycemia: Secondary | ICD-10-CM | POA: Diagnosis not present

## 2020-11-26 DIAGNOSIS — E119 Type 2 diabetes mellitus without complications: Secondary | ICD-10-CM | POA: Insufficient documentation

## 2020-11-26 DIAGNOSIS — M869 Osteomyelitis, unspecified: Secondary | ICD-10-CM | POA: Diagnosis present

## 2020-11-26 DIAGNOSIS — E11621 Type 2 diabetes mellitus with foot ulcer: Secondary | ICD-10-CM | POA: Diagnosis present

## 2020-11-26 DIAGNOSIS — M86171 Other acute osteomyelitis, right ankle and foot: Secondary | ICD-10-CM | POA: Diagnosis present

## 2020-11-26 DIAGNOSIS — E11628 Type 2 diabetes mellitus with other skin complications: Secondary | ICD-10-CM | POA: Diagnosis not present

## 2020-11-26 DIAGNOSIS — M60073 Infective myositis, right foot: Secondary | ICD-10-CM | POA: Diagnosis not present

## 2020-11-26 HISTORY — DX: Hyperlipidemia, unspecified: E78.5

## 2020-11-26 HISTORY — DX: Type 2 diabetes mellitus without complications: E11.9

## 2020-11-26 HISTORY — DX: Essential (primary) hypertension: I10

## 2020-11-26 HISTORY — DX: Osteomyelitis, unspecified: M86.9

## 2020-11-26 LAB — CBC WITH DIFFERENTIAL/PLATELET
Abs Immature Granulocytes: 0.09 10*3/uL — ABNORMAL HIGH (ref 0.00–0.07)
Basophils Absolute: 0 10*3/uL (ref 0.0–0.1)
Basophils Relative: 0 %
Eosinophils Absolute: 0 10*3/uL (ref 0.0–0.5)
Eosinophils Relative: 0 %
HCT: 36.1 % — ABNORMAL LOW (ref 39.0–52.0)
Hemoglobin: 12.3 g/dL — ABNORMAL LOW (ref 13.0–17.0)
Immature Granulocytes: 1 %
Lymphocytes Relative: 3 %
Lymphs Abs: 0.4 10*3/uL — ABNORMAL LOW (ref 0.7–4.0)
MCH: 27.3 pg (ref 26.0–34.0)
MCHC: 34.1 g/dL (ref 30.0–36.0)
MCV: 80 fL (ref 80.0–100.0)
Monocytes Absolute: 1.2 10*3/uL — ABNORMAL HIGH (ref 0.1–1.0)
Monocytes Relative: 9 %
Neutro Abs: 11.7 10*3/uL — ABNORMAL HIGH (ref 1.7–7.7)
Neutrophils Relative %: 87 %
Platelets: 226 10*3/uL (ref 150–400)
RBC: 4.51 MIL/uL (ref 4.22–5.81)
RDW: 12.3 % (ref 11.5–15.5)
WBC: 13.4 10*3/uL — ABNORMAL HIGH (ref 4.0–10.5)
nRBC: 0 % (ref 0.0–0.2)

## 2020-11-26 LAB — RESP PANEL BY RT-PCR (FLU A&B, COVID) ARPGX2
Influenza A by PCR: NEGATIVE
Influenza B by PCR: NEGATIVE
SARS Coronavirus 2 by RT PCR: NEGATIVE

## 2020-11-26 LAB — SEDIMENTATION RATE: Sed Rate: 81 mm/hr — ABNORMAL HIGH (ref 0–16)

## 2020-11-26 LAB — C-REACTIVE PROTEIN: CRP: 19.6 mg/dL — ABNORMAL HIGH (ref <1.0)

## 2020-11-26 LAB — URINALYSIS, ROUTINE W REFLEX MICROSCOPIC
Bacteria, UA: NONE SEEN
Bilirubin Urine: NEGATIVE
Glucose, UA: >=500 mg/dL — AB
Ketones, ur: 20 mg/dL — AB
Leukocytes,Ua: NEGATIVE
Nitrite: NEGATIVE
Protein, ur: 30 mg/dL — AB
Specific Gravity, Urine: 1.028 (ref 1.005–1.030)
pH: 6 (ref 5.0–8.0)

## 2020-11-26 LAB — COMPREHENSIVE METABOLIC PANEL
ALT: 22 U/L (ref 0–44)
AST: 31 U/L (ref 15–41)
Albumin: 3 g/dL — ABNORMAL LOW (ref 3.5–5.0)
Alkaline Phosphatase: 75 U/L (ref 38–126)
Anion gap: 13 (ref 5–15)
BUN: 18 mg/dL (ref 8–23)
CO2: 22 mmol/L (ref 22–32)
Calcium: 9 mg/dL (ref 8.9–10.3)
Chloride: 93 mmol/L — ABNORMAL LOW (ref 98–111)
Creatinine, Ser: 1.05 mg/dL (ref 0.61–1.24)
GFR, Estimated: >60 mL/min (ref >60)
Glucose, Bld: 462 mg/dL — ABNORMAL HIGH (ref 70–99)
Potassium: 5.1 mmol/L (ref 3.5–5.1)
Sodium: 128 mmol/L — ABNORMAL LOW (ref 135–145)
Total Bilirubin: 1 mg/dL (ref 0.3–1.2)
Total Protein: 7.5 g/dL (ref 6.5–8.1)

## 2020-11-26 LAB — CBG MONITORING, ED
Glucose-Capillary: 386 mg/dL — ABNORMAL HIGH (ref 70–99)
Glucose-Capillary: 224 mg/dL — ABNORMAL HIGH (ref 70–99)

## 2020-11-26 LAB — PROTIME-INR
INR: 1.2 (ref 0.8–1.2)
Prothrombin Time: 15.3 seconds — ABNORMAL HIGH (ref 11.4–15.2)

## 2020-11-26 LAB — LACTIC ACID, PLASMA
Lactic Acid, Venous: 1.7 mmol/L (ref 0.5–1.9)
Lactic Acid, Venous: 1.2 mmol/L (ref 0.5–1.9)

## 2020-11-26 LAB — APTT: aPTT: 28 seconds (ref 24–36)

## 2020-11-26 MED ORDER — LACTATED RINGERS IV BOLUS (SEPSIS)
1000.0000 mL | Freq: Once | INTRAVENOUS | Status: AC
  Administered 2020-11-26: 1000 mL via INTRAVENOUS

## 2020-11-26 MED ORDER — HYDROMORPHONE HCL 1 MG/ML IJ SOLN
1.0000 mg | Freq: Once | INTRAMUSCULAR | Status: AC
  Administered 2020-11-26: 1 mg via INTRAVENOUS
  Filled 2020-11-26: qty 1

## 2020-11-26 MED ORDER — VANCOMYCIN HCL IN DEXTROSE 1-5 GM/200ML-% IV SOLN
1000.0000 mg | Freq: Once | INTRAVENOUS | Status: AC
  Administered 2020-11-26: 1000 mg via INTRAVENOUS
  Filled 2020-11-26: qty 200

## 2020-11-26 MED ORDER — ONDANSETRON HCL 4 MG/2ML IJ SOLN
4.0000 mg | Freq: Once | INTRAMUSCULAR | Status: AC
  Administered 2020-11-26: 4 mg via INTRAVENOUS
  Filled 2020-11-26: qty 2

## 2020-11-26 MED ORDER — SODIUM CHLORIDE 0.9 % IV SOLN
2.0000 g | Freq: Once | INTRAVENOUS | Status: AC
  Administered 2020-11-26: 2 g via INTRAVENOUS
  Filled 2020-11-26: qty 2

## 2020-11-26 MED ORDER — INSULIN ASPART 100 UNIT/ML IJ SOLN
10.0000 [IU] | Freq: Once | INTRAMUSCULAR | Status: AC
  Administered 2020-11-26: 10 [IU] via SUBCUTANEOUS
  Filled 2020-11-26: qty 0.1

## 2020-11-26 MED ORDER — GADOBUTROL 1 MMOL/ML IV SOLN
10.0000 mL | Freq: Once | INTRAVENOUS | Status: AC | PRN
  Administered 2020-11-26: 10 mL via INTRAVENOUS

## 2020-11-26 NOTE — ED Notes (Signed)
Patient transported to MRI 

## 2020-11-26 NOTE — ED Notes (Signed)
Rance Smithson patients wife would like a call back [phone] Also: Patients niece would like a call back [phone]

## 2020-11-26 NOTE — ED Triage Notes (Signed)
Pt presents to ED via ems cc sepsis, right foot infection. Pt states injuring his right over a month ago. Pt states swelling, pain, discharge from site since. Pt right noted to be swollen, discharge. Pt A&ox4. Respirations equal, unlabored.

## 2020-11-26 NOTE — ED Notes (Addendum)
Placed call to Flow manager per Palumbo's MD request Flow manager reported that Julian Reil MD is about 30 min for consult call back

## 2020-11-26 NOTE — ED Notes (Signed)
Pt returns from MRI. IV in left hand was removed by pt on transfer to bed. Urinal spilled in bed. Pt cleaned and new linens provided. VS and cbg obtained post sq injection.

## 2020-11-26 NOTE — ED Provider Notes (Addendum)
[facility] DEPT Provider Note   CSN: [id number] Arrival date & time: 11/26/20  1806     History Chief Complaint  Patient presents with   Code Sepsis    Cory [name] is a 75 y.o. male.  About 1 month ago, the patient was doing his usual nightly exercises, and the next morning he developed right lateral ankle and foot pain.  He states that the symptoms have progressed, and he now has diffuse pain over his entire right foot and ankle.  He has been to urgent care in the emergency department multiple times, and he states that he also saw an orthopedist.  He came in via EMS after being found on the floor.  He had passed out in his home apparently.  4 days ago, he was prescribed Bactrim and Keflex in the ED.  He states he is taking these medications.  He has had x-rays, DVT scan, and ankle arthrocentesis was attempted unsuccessfully on 11/22/2020.  Denies any systemic symptoms and is feeling well otherwise.  The history is provided by the patient.  Ankle Pain Location:  Ankle Time since incident:  1 month Injury: no   Ankle location:  R ankle Pain details:    Quality:  Sharp and throbbing   Radiates to:  Does not radiate   Severity:  Severe   Onset quality:  Gradual   Duration:  1 month   Timing:  Constant   Progression:  Worsening Chronicity:  New Prior injury to area:  No Relieved by:  Nothing Worsened by:  Bearing weight Ineffective treatments:  NSAIDs and ice (antibiotics) Associated symptoms: decreased ROM and swelling   Associated symptoms: no back pain and no fever        Patient Active Problem List   Diagnosis Date Noted   Hyperlipidemia 11/26/2020   Diabetes mellitus without complication ([place]) 33/35/4562   Hypertension 11/26/2020    No past surgical history on file.     No family history on file.  Social History   Tobacco Use   Smoking status: Never   Smokeless tobacco: Never    Home Medications Prior to Admission  medications   Medication Sig Start Date End Date Taking? Authorizing Provider  Accu-Chek Softclix Lancets lancets USE AS directed to test blood sugar daily 11/02/20     acetaminophen (TYLENOL) 500 MG tablet Take 1,000 mg by mouth every 6 (six) hours as needed for moderate pain or headache.    [provider]  atorvastatin (LIPITOR) 20 MG tablet Take 20 mg by mouth at bedtime. 09/14/20   [provider]  Blood Glucose Monitoring Suppl (BLOOD GLUCOSE MONITOR SYSTEM) w/Device KIT 1 (one) Kit as directed 11/02/20     cephALEXin (KEFLEX) 500 MG capsule Take 1 capsule (500 mg total) by mouth 3 (three) times daily for 5 days. 11/22/20 11/27/20  Levin Bacon, MD  colchicine 0.6 MG tablet Take 1 tablet (0.6 mg total) by mouth 2 (two) times daily for 7 days. 11/18/20 11/25/20  Noemi Chapel, MD  dapagliflozin propanediol (FARXIGA) 10 MG TABS tablet Take 1 tablet by mouth every morning 11/02/20     diclofenac Sodium (VOLTAREN) 1 % GEL Apply 2 g topically 4 (four) times daily. Patient not taking: No sig reported 10/28/20   Peri Jefferson, PA-C  Dulaglutide (TRULICITY) 5.63 SL/3.7DS SOPN 1 (one) Solution Pen-injector under skin once a week Patient taking differently: Inject 0.75 mg into the skin every Monday. 11/02/20     furosemide (LASIX) 20 MG tablet  Take 1-2 tablets in the morning for your ankle and foot swelling. Patient not taking: No sig reported 11/12/20   Vanessa Kick, MD  glucose blood test strip use as directed to test blood sugar daily 11/02/20     HYDROcodone-acetaminophen (NORCO/VICODIN) 5-325 MG tablet Take 1 tablet by mouth every 6 (six) hours as needed for up to 10 doses for severe pain. 11/22/20   Levin Bacon, MD  Insulin Pen Needle (B-D UF III MINI PEN NEEDLES) 31G X 5 MM MISC 1 (one) Each daily 11/02/20     LANTUS SOLOSTAR 100 UNIT/ML Solostar Pen Inject 45 Units into the skin daily. 11/11/20   [provider]  losartan-hydrochlorothiazide (HYZAAR) 100-25 MG  tablet Take 1 tablet by mouth daily. 09/14/20   [provider]  metFORMIN (GLUCOPHAGE) 1000 MG tablet Take 1,000 mg by mouth 2 (two) times daily. 06/15/20   [provider]  metoprolol succinate (TOPROL-XL) 100 MG 24 hr tablet Take 100 mg by mouth daily. 11/09/20   [provider]  NIFEdipine (PROCARDIA-XL/NIFEDICAL-XL) 30 MG 24 hr tablet Take 30 mg by mouth daily. 09/16/20   [provider]  predniSONE (DELTASONE) 20 MG tablet Take 2 tablets (40 mg total) by mouth daily. 11/18/20   Noemi Chapel, MD  sulfamethoxazole-trimethoprim (BACTRIM DS) 800-160 MG tablet Take 1 tablet by mouth 2 (two) times daily for 5 days. 11/22/20 11/27/20  Levin Bacon, MD  tamsulosin (FLOMAX) 0.4 MG CAPS capsule 1 Capsule by mouth daily Patient taking differently: Take 0.4 mg by mouth at bedtime. 11/02/20       Allergies    Patient has no known allergies.  Review of Systems   Review of Systems  Constitutional:  Negative for chills and fever.  HENT:  Negative for ear pain and sore throat.   Eyes:  Negative for pain and visual disturbance.  Respiratory:  Negative for cough and shortness of breath.   Cardiovascular:  Negative for chest pain and palpitations.  Gastrointestinal:  Negative for abdominal pain and vomiting.  Genitourinary:  Negative for dysuria and hematuria.  Musculoskeletal:  Positive for joint swelling. Negative for arthralgias and back pain.  Skin:  Positive for color change. Negative for rash.  Neurological:  Negative for seizures and syncope.  All other systems reviewed and are negative.  Physical Exam Updated Vital Signs BP (!) 165/86 (BP Location: Right Arm)   Pulse (!) 109   Temp (!) 97.5 F (36.4 C) (Oral)   Resp 18   Ht _0  (1.803 m)   Wt 95.3 kg   SpO2 96%   BMI 29.29 kg/m   Physical Exam Vitals and nursing note reviewed.  Constitutional:      Appearance: Normal appearance.  HENT:     Head: Normocephalic and atraumatic.  Eyes:      Conjunctiva/sclera: Conjunctivae normal.  Pulmonary:     Effort: Pulmonary effort is normal. No respiratory distress.  Musculoskeletal:     Cervical back: Normal range of motion.     Comments: The right lower leg and especially the ankle and foot are extremely swollen, erythematous, and warm.  There appears to be a collection of blood at the posterior ankle near the Achilles and calcaneus.  There is also some spontaneous, scantly bloody discharge from near this area.  There is an ankle effusion.  He is exquisitely tender over all surfaces.  Her motion is exceedingly limited for all motion. He has a palpable dorsalis pedis and posterior tibialis pulse.  Skin:  General: Skin is warm and dry.  Neurological:     General: No focal deficit present.     Mental Status: He is alert and oriented to person, place, and time. Mental status is at baseline.  Psychiatric:        Mood and Affect: Mood normal.    ED Results / Procedures / Treatments   Labs (all labs ordered are listed, but only abnormal results are displayed) Labs Reviewed  COMPREHENSIVE METABOLIC PANEL - Abnormal; Notable for the following components:      Result Value   Sodium 128 (*)    Chloride 93 (*)    Glucose, Bld 462 (*)    Albumin 3.0 (*)    All other components within normal limits  CBC WITH DIFFERENTIAL/PLATELET - Abnormal; Notable for the following components:   WBC 13.4 (*)    Hemoglobin 12.3 (*)    HCT 36.1 (*)    Neutro Abs 11.7 (*)    Lymphs Abs 0.4 (*)    Monocytes Absolute 1.2 (*)    Abs Immature Granulocytes 0.09 (*)    All other components within normal limits  PROTIME-INR - Abnormal; Notable for the following components:   Prothrombin Time 15.3 (*)    All other components within normal limits  URINALYSIS, ROUTINE W REFLEX MICROSCOPIC - Abnormal; Notable for the following components:   Color, Urine STRAW (*)    Glucose, UA >=500 (*)    Hgb urine dipstick MODERATE (*)    Ketones, ur 20 (*)    Protein, ur  30 (*)    All other components within normal limits  SEDIMENTATION RATE - Abnormal; Notable for the following components:   Sed Rate 81 (*)    All other components within normal limits  C-REACTIVE PROTEIN - Abnormal; Notable for the following components:   CRP 19.6 (*)    All other components within normal limits  CBG MONITORING, ED - Abnormal; Notable for the following components:   Glucose-Capillary 386 (*)    All other components within normal limits  CBG MONITORING, ED - Abnormal; Notable for the following components:   Glucose-Capillary 224 (*)    All other components within normal limits  CULTURE, BLOOD (ROUTINE X 2)  RESP PANEL BY RT-PCR (FLU A&B, COVID) ARPGX2  CULTURE, BLOOD (ROUTINE X 2)  CULTURE, BLOOD (SINGLE)  LACTIC ACID, PLASMA  LACTIC ACID, PLASMA  APTT    EKG None  Radiology CT Head Wo Contrast  Result Date: 11/26/2020 CLINICAL DATA:  Head trauma, minor (Age >= 65y) EXAM: CT HEAD WITHOUT CONTRAST TECHNIQUE: Contiguous axial images were obtained from the base of the skull through the vertex without intravenous contrast. COMPARISON:  None. FINDINGS: Brain: Patchy and confluent areas of decreased attenuation are noted throughout the deep and periventricular white matter of the cerebral hemispheres bilaterally, compatible with chronic microvascular ischemic disease. No evidence of large-territorial acute infarction. No parenchymal hemorrhage. No mass lesion. No extra-axial collection. No mass effect or midline shift. No hydrocephalus. Basilar cisterns are patent. Vascular: No hyperdense vessel. Skull: No acute fracture or focal lesion. Sinuses/Orbits: Paranasal sinuses and mastoid air cells are clear. The orbits are unremarkable. Other: None. IMPRESSION: No acute intracranial abnormality. Electronically Signed   By: Iven Finn M.D.   On: 11/26/2020 20:00   DG Chest Port 1 View  Result Date: 11/26/2020 CLINICAL DATA:  Foot infection EXAM: PORTABLE CHEST 1 VIEW  COMPARISON:  None. FINDINGS: The heart size and mediastinal contours are within normal limits. Both lungs are  clear. The visualized skeletal structures are unremarkable. IMPRESSION: No active disease. Electronically Signed   By: Donavan Foil M.D.   On: 11/26/2020 19:26    Procedures .Critical Care  Date/Time: 12/15/2020 7:44 AM Performed by: Arnaldo Natal, MD Authorized by: Arnaldo Natal, MD   Critical care provider statement:    Critical care time (minutes):  30   Critical care time was exclusive of:  Separately billable procedures and treating other patients and teaching time   Critical care was necessary to treat or prevent imminent or life-threatening deterioration of the following conditions:  Sepsis   Critical care was time spent personally by me on the following activities:  Blood draw for specimens, development of treatment plan with patient or surrogate, discussions with consultants, evaluation of patient's response to treatment, examination of patient, obtaining history from patient or surrogate, ordering and performing treatments and interventions, ordering and review of laboratory studies, ordering and review of radiographic studies, pulse oximetry, re-evaluation of patient's condition and review of old charts   I assumed direction of critical care for this patient from another provider in my specialty: no     Care discussed with: admitting provider     Medications Ordered in ED Medications  ceFEPIme (MAXIPIME) 2 g in sodium chloride 0.9 % 100 mL IVPB (2 g Intravenous New Bag/Given 11/26/20 2322)  lactated ringers bolus 1,000 mL (0 mLs Intravenous Stopped 11/26/20 2006)  HYDROmorphone (DILAUDID) injection 1 mg (1 mg Intravenous Given 11/26/20 1902)  ondansetron (ZOFRAN) injection 4 mg (4 mg Intravenous Given 11/26/20 1901)  vancomycin (VANCOCIN) IVPB 1000 mg/200 mL premix (0 mg Intravenous Stopped 11/26/20 2006)  insulin aspart (novoLOG) injection 10 Units (10 Units Subcutaneous Given  11/26/20 2043)  gadobutrol (GADAVIST) 1 MMOL/ML injection 10 mL (10 mLs Intravenous Contrast Given 11/26/20 2255)    ED Course  I have reviewed the triage vital signs and the nursing notes.  Pertinent labs & imaging results that were available during my care of the patient were reviewed by me and considered in my medical decision making (see chart for details).    MDM Rules/Calculators/A&P                           Cory [name] is 75 years old and has a history of diabetes.  He has had foot and ankle pain for approximately 1 month and has been treated as an outpatient.  Unfortunately, his symptoms have progressed, and he has developed osteomyelitis.  He was treated with vancomycin and cefepime.  He was given insulin for his hyperglycemia.  He will be admitted for further treatment. Final Clinical Impression(s) / ED Diagnoses Final diagnoses:  Osteomyelitis of right foot, unspecified type ([place])  Hyperglycemia due to diabetes mellitus [facility])    Rx / DC Orders ED Discharge Orders     None        Arnaldo Natal, MD 11/26/20 2345    Arnaldo Natal, MD 12/15/20 [phone]

## 2020-11-26 NOTE — ED Notes (Signed)
Pt is at MRI

## 2020-11-27 ENCOUNTER — Inpatient Hospital Stay (HOSPITAL_COMMUNITY): Payer: Medicare HMO

## 2020-11-27 ENCOUNTER — Encounter (HOSPITAL_COMMUNITY): Payer: Self-pay | Admitting: Internal Medicine

## 2020-11-27 DIAGNOSIS — Z794 Long term (current) use of insulin: Secondary | ICD-10-CM

## 2020-11-27 DIAGNOSIS — A419 Sepsis, unspecified organism: Secondary | ICD-10-CM

## 2020-11-27 DIAGNOSIS — M869 Osteomyelitis, unspecified: Secondary | ICD-10-CM

## 2020-11-27 DIAGNOSIS — I10 Essential (primary) hypertension: Secondary | ICD-10-CM | POA: Diagnosis not present

## 2020-11-27 DIAGNOSIS — E11628 Type 2 diabetes mellitus with other skin complications: Secondary | ICD-10-CM | POA: Diagnosis not present

## 2020-11-27 DIAGNOSIS — R6521 Severe sepsis with septic shock: Secondary | ICD-10-CM | POA: Diagnosis present

## 2020-11-27 DIAGNOSIS — E785 Hyperlipidemia, unspecified: Secondary | ICD-10-CM | POA: Diagnosis not present

## 2020-11-27 DIAGNOSIS — E663 Overweight: Secondary | ICD-10-CM | POA: Diagnosis present

## 2020-11-27 DIAGNOSIS — L0390 Cellulitis, unspecified: Secondary | ICD-10-CM | POA: Diagnosis not present

## 2020-11-27 HISTORY — DX: Sepsis, unspecified organism: A41.9

## 2020-11-27 LAB — BASIC METABOLIC PANEL
Anion gap: 14 (ref 5–15)
BUN: 12 mg/dL (ref 8–23)
CO2: 24 mmol/L (ref 22–32)
Calcium: 8.6 mg/dL — ABNORMAL LOW (ref 8.9–10.3)
Chloride: 93 mmol/L — ABNORMAL LOW (ref 98–111)
Creatinine, Ser: 0.83 mg/dL (ref 0.61–1.24)
GFR, Estimated: >60 mL/min (ref >60)
Glucose, Bld: 243 mg/dL — ABNORMAL HIGH (ref 70–99)
Potassium: 3.9 mmol/L (ref 3.5–5.1)
Sodium: 131 mmol/L — ABNORMAL LOW (ref 135–145)

## 2020-11-27 LAB — CBC
HCT: 35.2 % — ABNORMAL LOW (ref 39.0–52.0)
HCT: 38.4 % — ABNORMAL LOW (ref 39.0–52.0)
Hemoglobin: 11.8 g/dL — ABNORMAL LOW (ref 13.0–17.0)
Hemoglobin: 12.8 g/dL — ABNORMAL LOW (ref 13.0–17.0)
MCH: 26.8 pg (ref 26.0–34.0)
MCH: 27.2 pg (ref 26.0–34.0)
MCHC: 33.5 g/dL (ref 30.0–36.0)
MCHC: 33.3 g/dL (ref 30.0–36.0)
MCV: 80 fL (ref 80.0–100.0)
MCV: 81.7 fL (ref 80.0–100.0)
Platelets: 221 10*3/uL (ref 150–400)
Platelets: 241 10*3/uL (ref 150–400)
RBC: 4.4 MIL/uL (ref 4.22–5.81)
RBC: 4.7 MIL/uL (ref 4.22–5.81)
RDW: 12.5 % (ref 11.5–15.5)
RDW: 12.5 % (ref 11.5–15.5)
WBC: 12.3 10*3/uL — ABNORMAL HIGH (ref 4.0–10.5)
WBC: 12.7 10*3/uL — ABNORMAL HIGH (ref 4.0–10.5)
nRBC: 0 % (ref 0.0–0.2)
nRBC: 0 % (ref 0.0–0.2)

## 2020-11-27 LAB — MRSA NEXT GEN BY PCR, NASAL: MRSA by PCR Next Gen: DETECTED — AB

## 2020-11-27 LAB — CBG MONITORING, ED
Glucose-Capillary: 211 mg/dL — ABNORMAL HIGH (ref 70–99)
Glucose-Capillary: 161 mg/dL — ABNORMAL HIGH (ref 70–99)
Glucose-Capillary: 154 mg/dL — ABNORMAL HIGH (ref 70–99)
Glucose-Capillary: 159 mg/dL — ABNORMAL HIGH (ref 70–99)

## 2020-11-27 LAB — HEMOGLOBIN A1C
Hgb A1c MFr Bld: 12.1 % — ABNORMAL HIGH (ref 4.8–5.6)
Mean Plasma Glucose: 301 mg/dL

## 2020-11-27 LAB — BRAIN NATRIURETIC PEPTIDE: B Natriuretic Peptide: 62.4 pg/mL (ref 0.0–100.0)

## 2020-11-27 MED ORDER — INSULIN ASPART 100 UNIT/ML IJ SOLN
6.0000 [IU] | Freq: Three times a day (TID) | INTRAMUSCULAR | Status: DC
  Filled 2020-11-27: qty 0.06

## 2020-11-27 MED ORDER — VANCOMYCIN HCL IN DEXTROSE 1-5 GM/200ML-% IV SOLN
1000.0000 mg | Freq: Two times a day (BID) | INTRAVENOUS | Status: DC
  Administered 2020-11-28 (×3): 1000 mg via INTRAVENOUS
  Filled 2020-11-27 (×4): qty 200

## 2020-11-27 MED ORDER — LOSARTAN POTASSIUM-HCTZ 100-25 MG PO TABS: 1.0000 | ORAL_TABLET | Freq: Every day | ORAL | Status: DC

## 2020-11-27 MED ORDER — METOPROLOL TARTRATE 5 MG/5ML IV SOLN
5.0000 mg | INTRAVENOUS | Status: AC
  Administered 2020-11-27: 5 mg via INTRAVENOUS
  Filled 2020-11-27: qty 5

## 2020-11-27 MED ORDER — TAMSULOSIN HCL 0.4 MG PO CAPS
0.4000 mg | ORAL_CAPSULE | Freq: Every day | ORAL | Status: DC
  Administered 2020-11-27 – 2020-12-07 (×11): 0.4 mg via ORAL
  Filled 2020-11-27 (×11): qty 1

## 2020-11-27 MED ORDER — INSULIN ASPART 100 UNIT/ML IJ SOLN
0.0000 [IU] | Freq: Three times a day (TID) | INTRAMUSCULAR | Status: DC
  Filled 2020-11-27: qty 0.15

## 2020-11-27 MED ORDER — METOPROLOL SUCCINATE ER 50 MG PO TB24
100.0000 mg | ORAL_TABLET | Freq: Every day | ORAL | Status: DC
  Administered 2020-11-27 – 2020-12-05 (×8): 100 mg via ORAL
  Filled 2020-11-27 (×9): qty 2

## 2020-11-27 MED ORDER — ONDANSETRON HCL 4 MG/2ML IJ SOLN
4.0000 mg | Freq: Four times a day (QID) | INTRAMUSCULAR | Status: DC | PRN
  Administered 2020-11-27: 4 mg via INTRAVENOUS
  Filled 2020-11-27: qty 2

## 2020-11-27 MED ORDER — INSULIN ASPART 100 UNIT/ML IJ SOLN
0.0000 [IU] | INTRAMUSCULAR | Status: DC
  Administered 2020-11-27: 3 [IU] via SUBCUTANEOUS
  Administered 2020-11-27: 2 [IU] via SUBCUTANEOUS
  Administered 2020-11-28: 1 [IU] via SUBCUTANEOUS
  Administered 2020-11-28: 2 [IU] via SUBCUTANEOUS
  Administered 2020-11-28: 5 [IU] via SUBCUTANEOUS
  Administered 2020-11-28: 3 [IU] via SUBCUTANEOUS
  Administered 2020-11-28: 1 [IU] via SUBCUTANEOUS
  Administered 2020-11-29 (×2): 3 [IU] via SUBCUTANEOUS
  Administered 2020-11-29: 2 [IU] via SUBCUTANEOUS
  Administered 2020-11-29: 9 [IU] via SUBCUTANEOUS
  Administered 2020-11-29: 2 [IU] via SUBCUTANEOUS
  Administered 2020-11-29: 3 [IU] via SUBCUTANEOUS
  Administered 2020-11-30: 5 [IU] via SUBCUTANEOUS
  Administered 2020-11-30: 2 [IU] via SUBCUTANEOUS
  Administered 2020-11-30: 3 [IU] via SUBCUTANEOUS
  Administered 2020-11-30: 7 [IU] via SUBCUTANEOUS
  Administered 2020-11-30: 2 [IU] via SUBCUTANEOUS
  Administered 2020-11-30: 3 [IU] via SUBCUTANEOUS
  Administered 2020-12-01: 2 [IU] via SUBCUTANEOUS
  Administered 2020-12-01 (×4): 3 [IU] via SUBCUTANEOUS
  Administered 2020-12-02 (×2): 2 [IU] via SUBCUTANEOUS
  Administered 2020-12-02: 1 [IU] via SUBCUTANEOUS
  Administered 2020-12-02: 3 [IU] via SUBCUTANEOUS
  Administered 2020-12-02: 5 [IU] via SUBCUTANEOUS
  Administered 2020-12-03: 3 [IU] via SUBCUTANEOUS
  Administered 2020-12-03: 2 [IU] via SUBCUTANEOUS
  Administered 2020-12-03 – 2020-12-04 (×2): 3 [IU] via SUBCUTANEOUS
  Administered 2020-12-04: 4 [IU] via SUBCUTANEOUS
  Administered 2020-12-04: 7 [IU] via SUBCUTANEOUS
  Administered 2020-12-05: 1 [IU] via SUBCUTANEOUS
  Administered 2020-12-05: 2 [IU] via SUBCUTANEOUS
  Filled 2020-11-27: qty 0.09

## 2020-11-27 MED ORDER — INSULIN GLARGINE 100 UNIT/ML ~~LOC~~ SOLN
30.0000 [IU] | Freq: Every day | SUBCUTANEOUS | Status: DC
  Administered 2020-11-27 – 2020-11-30 (×4): 30 [IU] via SUBCUTANEOUS
  Filled 2020-11-27 (×5): qty 0.3

## 2020-11-27 MED ORDER — ONDANSETRON HCL 4 MG PO TABS: 4.0000 mg | ORAL_TABLET | Freq: Four times a day (QID) | ORAL | Status: DC | PRN

## 2020-11-27 MED ORDER — METRONIDAZOLE 500 MG PO TABS
500.0000 mg | ORAL_TABLET | Freq: Three times a day (TID) | ORAL | Status: DC
  Administered 2020-11-27 (×3): 500 mg via ORAL
  Filled 2020-11-27 (×3): qty 1

## 2020-11-27 MED ORDER — SODIUM CHLORIDE 0.9 % IV SOLN
2.0000 g | INTRAVENOUS | Status: DC
  Administered 2020-11-27: 2 g via INTRAVENOUS
  Filled 2020-11-27: qty 20

## 2020-11-27 MED ORDER — ATORVASTATIN CALCIUM 20 MG PO TABS
20.0000 mg | ORAL_TABLET | Freq: Every day | ORAL | Status: DC
  Administered 2020-11-27 – 2020-12-07 (×11): 20 mg via ORAL
  Filled 2020-11-27: qty 2
  Filled 2020-11-27 (×3): qty 1
  Filled 2020-11-27: qty 2
  Filled 2020-11-27 (×6): qty 1

## 2020-11-27 MED ORDER — NIFEDIPINE ER OSMOTIC RELEASE 30 MG PO TB24
30.0000 mg | ORAL_TABLET | Freq: Every day | ORAL | Status: DC
  Administered 2020-11-27 – 2020-11-29 (×2): 30 mg via ORAL
  Filled 2020-11-27 (×3): qty 1

## 2020-11-27 MED ORDER — LOSARTAN POTASSIUM 50 MG PO TABS
100.0000 mg | ORAL_TABLET | Freq: Every day | ORAL | Status: DC
  Administered 2020-11-27 – 2020-11-29 (×2): 100 mg via ORAL
  Filled 2020-11-27 (×2): qty 4
  Filled 2020-11-27 (×2): qty 2

## 2020-11-27 MED ORDER — METOPROLOL TARTRATE 5 MG/5ML IV SOLN: 5.0000 mg | Freq: Four times a day (QID) | INTRAVENOUS | Status: DC | PRN

## 2020-11-27 MED ORDER — ENOXAPARIN SODIUM 40 MG/0.4ML IJ SOSY
40.0000 mg | PREFILLED_SYRINGE | INTRAMUSCULAR | Status: DC
  Administered 2020-11-27: 40 mg via SUBCUTANEOUS
  Filled 2020-11-27: qty 0.4

## 2020-11-27 MED ORDER — ACETAMINOPHEN 650 MG RE SUPP: 650.0000 mg | Freq: Four times a day (QID) | RECTAL | Status: DC | PRN

## 2020-11-27 MED ORDER — ACETAMINOPHEN 325 MG PO TABS
650.0000 mg | ORAL_TABLET | Freq: Four times a day (QID) | ORAL | Status: DC | PRN
  Administered 2020-11-28 – 2020-12-08 (×8): 650 mg via ORAL
  Filled 2020-11-27 (×9): qty 2

## 2020-11-27 MED ORDER — HYDROMORPHONE HCL 1 MG/ML IJ SOLN
0.5000 mg | INTRAMUSCULAR | Status: DC | PRN
  Administered 2020-11-27 – 2020-12-05 (×12): 1 mg via INTRAVENOUS
  Filled 2020-11-27 (×13): qty 1

## 2020-11-27 MED ORDER — PIPERACILLIN-TAZOBACTAM 3.375 G IVPB
3.3750 g | Freq: Three times a day (TID) | INTRAVENOUS | Status: DC
  Administered 2020-11-27 – 2020-11-29 (×5): 3.375 g via INTRAVENOUS
  Filled 2020-11-27 (×5): qty 50

## 2020-11-27 NOTE — Progress Notes (Signed)
PROGRESS NOTE  Cory [name] IHK:[id number] DOB: 1946/02/08 DOA: 11/26/2020 PCP: Sherald Hess., MD  HPI/Recap of past 43 hours: 75 year old male with past medical history of poorly controlled diabetes mellitus, hypertension and hyperlipidemia who developed issues with some right ankle and foot pain with infection a month ago with symptoms persisting despite doses of antibiotics, x-rays, DVT ruled out and an unsuccessful ankle arthrocentesis on 7/17.  Patient presented to the emergency room on the evening of 7/21 with persistent complaints and found to have sepsis.  MRI of right foot/ankle revealed osteomyelitis.  Patient started on IV antibiotics and admitted to the hospitalist service on the morning of 7/22.  Orthopedic surgery consulted who plan to see patient.  Patient underwent bilateral ankle-brachial indices which actually notes good forward flow to both lower extremities.  Assessment/Plan: Principal Problem:   Sepsis ([place]) secondary to osteomyelitis and right heel: Patient met criteria for sepsis on admission given tachycardia, leukocytosis and osteomyelitis source.  Treated with IV antibiotics.  Initial MRI noted some concerns for possible gas-forming soft tissue concerning for necrotizing fasciitis.  Discussed with Ortho and this is likely from recent attempted arthrocentesis done 5 days ago.  Orthopedics to see to discuss about debridement and hopefully will not need amputation.  Continue antibiotics Active Problems:   Hyperlipidemia: Continue statin.    Uncontrolled diabetes mellitus ([place]): Patient n.p.o. for now every 4 hours sliding scale and some insulin.  A1c pending    Hypertension: Elevated blood pressures.  Restarted patient's home medications.  Given fluid resuscitation, will check a BNP.  No previous history of CHF.    Overweight (BMI 25.0-29.9): Meets criteria BMI greater than 25   Code Status: Full code  Family Communication: Updated niece by  phone  Disposition Plan: Depending upon surgical intervention   Consultants: Orthopedic surgery  Procedures: Planned surgical intervention  Antimicrobials: IV cefepime 7/21 x 1 dose IV Zosyn 7/22-present IV vancomycin 7/21-present  Flagyl and Rocephin x1 dose  DVT prophylaxis: Lovenox, discontinued changed to SCDs  Level of care: Med-Surg   Objective: Vitals:   11/27/20 1500 11/27/20 1800  BP: (!) 175/94 (!) 153/94  Pulse: 96 (!) 106  Resp: 18 18  Temp:    SpO2: 99% 98%    Intake/Output Summary (Last 24 hours) at 11/27/2020 1908 Last data filed at 11/26/2020 2352 Gross per 24 hour  Intake 100 ml  Output --  Net 100 ml   Filed Weights   11/26/20 1825  Weight: 95.3 kg   Body mass index is 29.29 kg/m.  Exam:  General: Alert and oriented x3, moderate distress secondary to pain HEENT: Normocephalic, atraumatic, mucous membranes are slightly dry Cardiovascular: Regular rate and rhythm, S1-S2, borderline tachycardia Respiratory: Clear to auscultation bilaterally Abdomen: Soft, nontender, nondistended, positive bowel sounds Musculoskeletal: No clubbing or cyanosis, trace pitting edema, right foot is swollen and tender with 1+ pitting edema some drainage coming from right heel with a small pinprick hole incision Skin: No other skin breaks tears or lesions other than noted above Psychiatry: Appropriate, no evidence of psychoses Neurology: No focal deficits   Data Reviewed: CBC: Recent Labs  Lab 11/22/20 1657 11/26/20 1839 11/27/20 0456  WBC 9.2 13.4* 12.3*  NEUTROABS 7.0 11.7*  --   HGB 14.4 12.3* 11.8*  HCT 42.5 36.1* 35.2*  MCV 81.3 80.0 80.0  PLT 251 226 756   Basic Metabolic Panel: Recent Labs  Lab 11/22/20 1657 11/26/20 1839 11/27/20 0456  NA 129* 128* 131*  K 4.0 5.1 3.9  PROGRESS NOTE  Cory [name] [id number]:[id number] DOB: 1946/02/08 DOA: 11/26/2020 PCP: Sherald Hess., MD  HPI/Recap of past 43 hours: 75 year old male with past medical history of poorly controlled diabetes mellitus, hypertension and hyperlipidemia who developed issues with some right ankle and foot pain with infection a month ago with symptoms persisting despite doses of antibiotics, x-rays, DVT ruled out and an unsuccessful ankle arthrocentesis on 7/17.  Patient presented to the emergency room on the evening of 7/21 with persistent complaints and found to have sepsis.  MRI of right foot/ankle revealed osteomyelitis.  Patient started on IV antibiotics and admitted to the hospitalist service on the morning of 7/22.  Orthopedic surgery consulted who plan to see patient.  Patient underwent bilateral ankle-brachial indices which actually notes good forward flow to both lower extremities.  Assessment/Plan: Principal Problem:   Sepsis ([place]) secondary to osteomyelitis and right heel: Patient met criteria for sepsis on admission given tachycardia, leukocytosis and osteomyelitis source.  Treated with IV antibiotics.  Initial MRI noted some concerns for possible gas-forming soft tissue concerning for necrotizing fasciitis.  Discussed with Ortho and this is likely from recent attempted arthrocentesis done 5 days ago.  Orthopedics to see to discuss about debridement and hopefully will not need amputation.  Continue antibiotics Active Problems:   Hyperlipidemia: Continue statin.    Uncontrolled diabetes mellitus ([place]): Patient n.p.o. for now every 4 hours sliding scale and some insulin.  A1c pending    Hypertension: Elevated blood pressures.  Restarted patient's home medications.  Given fluid resuscitation, will check a BNP.  No previous history of CHF.    Overweight (BMI 25.0-29.9): Meets criteria BMI greater than 25   Code Status: Full code  Family Communication: Updated niece by  phone  Disposition Plan: Depending upon surgical intervention   Consultants: Orthopedic surgery  Procedures: Planned surgical intervention  Antimicrobials: IV cefepime 7/21 x 1 dose IV Zosyn 7/22-present IV vancomycin 7/21-present  Flagyl and Rocephin x1 dose  DVT prophylaxis: Lovenox, discontinued changed to SCDs  Level of care: Med-Surg   Objective: Vitals:   11/27/20 1500 11/27/20 1800  BP: (!) 175/94 (!) 153/94  Pulse: 96 (!) 106  Resp: 18 18  Temp:    SpO2: 99% 98%    Intake/Output Summary (Last 24 hours) at 11/27/2020 1908 Last data filed at 11/26/2020 2352 Gross per 24 hour  Intake 100 ml  Output --  Net 100 ml   Filed Weights   11/26/20 1825  Weight: 95.3 kg   Body mass index is 29.29 kg/m.  Exam:  General: Alert and oriented x3, moderate distress secondary to pain HEENT: Normocephalic, atraumatic, mucous membranes are slightly dry Cardiovascular: Regular rate and rhythm, S1-S2, borderline tachycardia Respiratory: Clear to auscultation bilaterally Abdomen: Soft, nontender, nondistended, positive bowel sounds Musculoskeletal: No clubbing or cyanosis, trace pitting edema, right foot is swollen and tender with 1+ pitting edema some drainage coming from right heel with a small pinprick hole incision Skin: No other skin breaks tears or lesions other than noted above Psychiatry: Appropriate, no evidence of psychoses Neurology: No focal deficits   Data Reviewed: CBC: Recent Labs  Lab 11/22/20 1657 11/26/20 1839 11/27/20 0456  WBC 9.2 13.4* 12.3*  NEUTROABS 7.0 11.7*  --   HGB 14.4 12.3* 11.8*  HCT 42.5 36.1* 35.2*  MCV 81.3 80.0 80.0  PLT 251 226 756   Basic Metabolic Panel: Recent Labs  Lab 11/22/20 1657 11/26/20 1839 11/27/20 0456  NA 129* 128* 131*  K 4.0 5.1 3.9  likely reflecting a site of ulceration or wound. Extensive ill-defined fluid within the soft tissues at the posterolateral ankle. Numerous tiny foci of susceptibility are suggestive of soft tissue gas. The susceptibility extends proximally adjacent to the lateral margin of the distal Achilles tendon to approximately the level of the distal tibial metaphysis. No well-defined or rim enhancing fluid collections. Diffuse intramuscular edema within the intrinsic foot musculature suggesting myositis. IMPRESSION: IMPRESSION 1. Soft tissue irregularity at the posterolateral aspect of  the ankle, likely reflecting a site of ulceration or wound. Extensive ill-defined fluid within the soft tissues at the posterolateral ankle compatible with cellulitis. Numerous tiny foci of susceptibility throughout the soft tissues within this region likely reflecting soft tissue gas. No well-defined or rim-enhancing fluid collections. Findings are highly suspicious for a necrotizing soft tissue infection. Radiographic correlation of the right ankle and right tibia-fibula are recommended to assess for the extent of soft tissue gas. 2. Acute osteomyelitis involving the lateral aspect of the calcaneal body. 3. Tendinosis with long segment longitudinal split tear of the peroneus brevis tendon. Trace tenosynovitis within the peroneal tendon sheaths, which may be infectious. 4. Tendinosis versus reactive changes along the lateral aspect of the distal Achilles tendon. 5. Diffuse intramuscular edema within the intrinsic foot musculature suggesting myositis. These results will be called to the ordering clinician or representative by the Radiologist Assistant, and communication documented in the PACS or [employer]. Electronically Signed   By: Davina Poke D.O.   On: 11/27/2020 07:17   VAS [place] ABI WITH/WO TBI  Result Date: 11/27/2020  LOWER EXTREMITY DOPPLER STUDY Patient Name:  Cory [name]  Date of Exam:   11/27/2020 Medical Rec #: [id number]     Accession #:    [id number] Date of Birth: Apr 03, 1946     Patient Gender: M Patient Age:   74Y Exam Location:  [facility] Procedure:      VAS [place] ABI WITH/WO TBI Referring Phys: [id number] JARED M GARDNER --------------------------------------------------------------------------------  Indications: Ulceration. High Risk Factors: Diabetes.  Limitations: Today's exam was limited due to an open wound and patient              intolerant to cuff pressure. Comparison Study: No prior studies. Performing Technologist: Carlos Levering RVT  Examination Guidelines: A complete  evaluation includes at minimum, Doppler waveform signals and systolic blood pressure reading at the level of bilateral brachial, anterior tibial, and posterior tibial arteries, when vessel segments are accessible. Bilateral testing is considered an integral part of a complete examination. Photoelectric Plethysmograph (PPG) waveforms and toe systolic pressure readings are included as required and additional duplex testing as needed. Limited examinations for reoccurring indications may be performed as noted.  ABI Findings: +---------+------------------+-----+---------+--------+ Right    Rt Pressure (mmHg)IndexWaveform Comment  +---------+------------------+-----+---------+--------+ Brachial 161                    triphasic         +---------+------------------+-----+---------+--------+ PTA      187               1.16 triphasic         +---------+------------------+-----+---------+--------+ DP       191               1.19 triphasic         +---------+------------------+-----+---------+--------+ Great Toe160               0.99                   +---------+------------------+-----+---------+--------+ +---------+------------------+-----+---------+-------+  likely reflecting a site of ulceration or wound. Extensive ill-defined fluid within the soft tissues at the posterolateral ankle. Numerous tiny foci of susceptibility are suggestive of soft tissue gas. The susceptibility extends proximally adjacent to the lateral margin of the distal Achilles tendon to approximately the level of the distal tibial metaphysis. No well-defined or rim enhancing fluid collections. Diffuse intramuscular edema within the intrinsic foot musculature suggesting myositis. IMPRESSION: IMPRESSION 1. Soft tissue irregularity at the posterolateral aspect of  the ankle, likely reflecting a site of ulceration or wound. Extensive ill-defined fluid within the soft tissues at the posterolateral ankle compatible with cellulitis. Numerous tiny foci of susceptibility throughout the soft tissues within this region likely reflecting soft tissue gas. No well-defined or rim-enhancing fluid collections. Findings are highly suspicious for a necrotizing soft tissue infection. Radiographic correlation of the right ankle and right tibia-fibula are recommended to assess for the extent of soft tissue gas. 2. Acute osteomyelitis involving the lateral aspect of the calcaneal body. 3. Tendinosis with long segment longitudinal split tear of the peroneus brevis tendon. Trace tenosynovitis within the peroneal tendon sheaths, which may be infectious. 4. Tendinosis versus reactive changes along the lateral aspect of the distal Achilles tendon. 5. Diffuse intramuscular edema within the intrinsic foot musculature suggesting myositis. These results will be called to the ordering clinician or representative by the Radiologist Assistant, and communication documented in the PACS or [employer]. Electronically Signed   By: Davina Poke D.O.   On: 11/27/2020 07:17   VAS [place] ABI WITH/WO TBI  Result Date: 11/27/2020  LOWER EXTREMITY DOPPLER STUDY Patient Name:  Cory [name]  Date of Exam:   11/27/2020 Medical Rec #: [id number]     Accession #:    [id number] Date of Birth: Apr 03, 1946     Patient Gender: M Patient Age:   74Y Exam Location:  [facility] Procedure:      VAS [place] ABI WITH/WO TBI Referring Phys: [id number] JARED M GARDNER --------------------------------------------------------------------------------  Indications: Ulceration. High Risk Factors: Diabetes.  Limitations: Today's exam was limited due to an open wound and patient              intolerant to cuff pressure. Comparison Study: No prior studies. Performing Technologist: Carlos Levering RVT  Examination Guidelines: A complete  evaluation includes at minimum, Doppler waveform signals and systolic blood pressure reading at the level of bilateral brachial, anterior tibial, and posterior tibial arteries, when vessel segments are accessible. Bilateral testing is considered an integral part of a complete examination. Photoelectric Plethysmograph (PPG) waveforms and toe systolic pressure readings are included as required and additional duplex testing as needed. Limited examinations for reoccurring indications may be performed as noted.  ABI Findings: +---------+------------------+-----+---------+--------+ Right    Rt Pressure (mmHg)IndexWaveform Comment  +---------+------------------+-----+---------+--------+ Brachial 161                    triphasic         +---------+------------------+-----+---------+--------+ PTA      187               1.16 triphasic         +---------+------------------+-----+---------+--------+ DP       191               1.19 triphasic         +---------+------------------+-----+---------+--------+ Great Toe160               0.99                   +---------+------------------+-----+---------+--------+ +---------+------------------+-----+---------+-------+  likely reflecting a site of ulceration or wound. Extensive ill-defined fluid within the soft tissues at the posterolateral ankle. Numerous tiny foci of susceptibility are suggestive of soft tissue gas. The susceptibility extends proximally adjacent to the lateral margin of the distal Achilles tendon to approximately the level of the distal tibial metaphysis. No well-defined or rim enhancing fluid collections. Diffuse intramuscular edema within the intrinsic foot musculature suggesting myositis. IMPRESSION: IMPRESSION 1. Soft tissue irregularity at the posterolateral aspect of  the ankle, likely reflecting a site of ulceration or wound. Extensive ill-defined fluid within the soft tissues at the posterolateral ankle compatible with cellulitis. Numerous tiny foci of susceptibility throughout the soft tissues within this region likely reflecting soft tissue gas. No well-defined or rim-enhancing fluid collections. Findings are highly suspicious for a necrotizing soft tissue infection. Radiographic correlation of the right ankle and right tibia-fibula are recommended to assess for the extent of soft tissue gas. 2. Acute osteomyelitis involving the lateral aspect of the calcaneal body. 3. Tendinosis with long segment longitudinal split tear of the peroneus brevis tendon. Trace tenosynovitis within the peroneal tendon sheaths, which may be infectious. 4. Tendinosis versus reactive changes along the lateral aspect of the distal Achilles tendon. 5. Diffuse intramuscular edema within the intrinsic foot musculature suggesting myositis. These results will be called to the ordering clinician or representative by the Radiologist Assistant, and communication documented in the PACS or [employer]. Electronically Signed   By: Davina Poke D.O.   On: 11/27/2020 07:17   VAS [place] ABI WITH/WO TBI  Result Date: 11/27/2020  LOWER EXTREMITY DOPPLER STUDY Patient Name:  Cory [name]  Date of Exam:   11/27/2020 Medical Rec #: [id number]     Accession #:    [id number] Date of Birth: Apr 03, 1946     Patient Gender: M Patient Age:   74Y Exam Location:  [facility] Procedure:      VAS [place] ABI WITH/WO TBI Referring Phys: [id number] JARED M GARDNER --------------------------------------------------------------------------------  Indications: Ulceration. High Risk Factors: Diabetes.  Limitations: Today's exam was limited due to an open wound and patient              intolerant to cuff pressure. Comparison Study: No prior studies. Performing Technologist: Carlos Levering RVT  Examination Guidelines: A complete  evaluation includes at minimum, Doppler waveform signals and systolic blood pressure reading at the level of bilateral brachial, anterior tibial, and posterior tibial arteries, when vessel segments are accessible. Bilateral testing is considered an integral part of a complete examination. Photoelectric Plethysmograph (PPG) waveforms and toe systolic pressure readings are included as required and additional duplex testing as needed. Limited examinations for reoccurring indications may be performed as noted.  ABI Findings: +---------+------------------+-----+---------+--------+ Right    Rt Pressure (mmHg)IndexWaveform Comment  +---------+------------------+-----+---------+--------+ Brachial 161                    triphasic         +---------+------------------+-----+---------+--------+ PTA      187               1.16 triphasic         +---------+------------------+-----+---------+--------+ DP       191               1.19 triphasic         +---------+------------------+-----+---------+--------+ Great Toe160               0.99                   +---------+------------------+-----+---------+--------+ +---------+------------------+-----+---------+-------+  likely reflecting a site of ulceration or wound. Extensive ill-defined fluid within the soft tissues at the posterolateral ankle. Numerous tiny foci of susceptibility are suggestive of soft tissue gas. The susceptibility extends proximally adjacent to the lateral margin of the distal Achilles tendon to approximately the level of the distal tibial metaphysis. No well-defined or rim enhancing fluid collections. Diffuse intramuscular edema within the intrinsic foot musculature suggesting myositis. IMPRESSION: IMPRESSION 1. Soft tissue irregularity at the posterolateral aspect of  the ankle, likely reflecting a site of ulceration or wound. Extensive ill-defined fluid within the soft tissues at the posterolateral ankle compatible with cellulitis. Numerous tiny foci of susceptibility throughout the soft tissues within this region likely reflecting soft tissue gas. No well-defined or rim-enhancing fluid collections. Findings are highly suspicious for a necrotizing soft tissue infection. Radiographic correlation of the right ankle and right tibia-fibula are recommended to assess for the extent of soft tissue gas. 2. Acute osteomyelitis involving the lateral aspect of the calcaneal body. 3. Tendinosis with long segment longitudinal split tear of the peroneus brevis tendon. Trace tenosynovitis within the peroneal tendon sheaths, which may be infectious. 4. Tendinosis versus reactive changes along the lateral aspect of the distal Achilles tendon. 5. Diffuse intramuscular edema within the intrinsic foot musculature suggesting myositis. These results will be called to the ordering clinician or representative by the Radiologist Assistant, and communication documented in the PACS or [employer]. Electronically Signed   By: Davina Poke D.O.   On: 11/27/2020 07:17   VAS [place] ABI WITH/WO TBI  Result Date: 11/27/2020  LOWER EXTREMITY DOPPLER STUDY Patient Name:  Cory [name]  Date of Exam:   11/27/2020 Medical Rec #: [id number]     Accession #:    [id number] Date of Birth: Apr 03, 1946     Patient Gender: M Patient Age:   74Y Exam Location:  [facility] Procedure:      VAS [place] ABI WITH/WO TBI Referring Phys: [id number] JARED M GARDNER --------------------------------------------------------------------------------  Indications: Ulceration. High Risk Factors: Diabetes.  Limitations: Today's exam was limited due to an open wound and patient              intolerant to cuff pressure. Comparison Study: No prior studies. Performing Technologist: Carlos Levering RVT  Examination Guidelines: A complete  evaluation includes at minimum, Doppler waveform signals and systolic blood pressure reading at the level of bilateral brachial, anterior tibial, and posterior tibial arteries, when vessel segments are accessible. Bilateral testing is considered an integral part of a complete examination. Photoelectric Plethysmograph (PPG) waveforms and toe systolic pressure readings are included as required and additional duplex testing as needed. Limited examinations for reoccurring indications may be performed as noted.  ABI Findings: +---------+------------------+-----+---------+--------+ Right    Rt Pressure (mmHg)IndexWaveform Comment  +---------+------------------+-----+---------+--------+ Brachial 161                    triphasic         +---------+------------------+-----+---------+--------+ PTA      187               1.16 triphasic         +---------+------------------+-----+---------+--------+ DP       191               1.19 triphasic         +---------+------------------+-----+---------+--------+ Great Toe160               0.99                   +---------+------------------+-----+---------+--------+ +---------+------------------+-----+---------+-------+

## 2020-11-27 NOTE — Progress Notes (Signed)
ABI's have been completed. Preliminary results can be found in CV Proc through chart review.   11/27/20 9:56 AM Olen Cordial RVT

## 2020-11-27 NOTE — H&P (Signed)
History and Physical    [name]:[id number] DOB: July 05, 1945 DOA: 11/26/2020  PCP: Frederich Chick., MD  Patient coming from: Home  I have personally briefly reviewed patient's old medical records in [facility] Link  Chief Complaint: R ankle infection  HPI: Cory [name] is a 75 y.o. male with medical history significant of DM2, HTN, HLD.  Pt admits he isnt consistent about taking his home meds at this point..  About 1 month ago pt was exercising.  The next morning he developed R lateral ankle and foot pain.  Symptoms have progressively worsened over the past month despite multiple ED visits, courses of keflex and bactrim.  X rays, DVT scan, and attempted ankle arthrocentesis on 7/17 (unsuccessful).  He has seen ortho surgery (Dr. Nilsa Nutting group).  Despite all of the above, foot infection has been worsening with worsening pain.  Symptoms worse with wt bearing.  No systemic symptoms until today:  Today pt presents to ED after apparent syncopal episode at home.  No fever, no SOB, no CP.   ED Course: Pt given IVF bolus.  Cefepime + vanc empirically.  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   Review of Systems: As per HPI, otherwise all review of systems negative.  Past Medical History:  Diagnosis Date   Diabetes mellitus without complication (HCC)    Hyperlipidemia    Hypertension     Past Surgical History:  Procedure Laterality Date   APPENDECTOMY       reports that he has never smoked. He has never used smokeless tobacco. He reports that he does not drink alcohol and does not use drugs.  No Known Allergies  Family History  Problem Relation Age of Onset   Diabetes Mother    Diabetes Father      Prior to Admission medications   Medication Sig Start Date End Date Taking? Authorizing Provider  Accu-Chek Softclix Lancets lancets USE AS directed to test blood sugar daily 11/02/20     acetaminophen (TYLENOL) 500 MG tablet Take 1,000 mg by  mouth every 6 (six) hours as needed for moderate pain or headache.    [provider]  atorvastatin (LIPITOR) 20 MG tablet Take 20 mg by mouth at bedtime. 09/14/20   [provider]  Blood Glucose Monitoring Suppl (BLOOD GLUCOSE MONITOR SYSTEM) w/Device KIT 1 (one) Kit as directed 11/02/20     colchicine 0.6 MG tablet Take 1 tablet (0.6 mg total) by mouth 2 (two) times daily for 7 days. 11/18/20 11/25/20  Eber Hong, MD  dapagliflozin propanediol (FARXIGA) 10 MG TABS tablet Take 1 tablet by mouth every morning 11/02/20     Dulaglutide (TRULICITY) 0.75 MG/0.5ML SOPN 1 (one) Solution Pen-injector under skin once a week Patient taking differently: Inject 0.75 mg into the skin every Monday. 11/02/20     glucose blood test strip use as directed to test blood sugar daily 11/02/20     HYDROcodone-acetaminophen (NORCO/VICODIN) 5-325 MG tablet Take 1 tablet by mouth every 6 (six) hours as needed for up to 10 doses for severe pain. 11/22/20   Colvin Caroli, MD  Insulin Pen Needle (B-D UF III MINI PEN NEEDLES) 31G X 5 MM MISC 1 (one) Each daily 11/02/20     LANTUS SOLOSTAR 100 UNIT/ML Solostar Pen Inject 45 Units into the skin daily. 11/11/20   [provider]  losartan-hydrochlorothiazide (HYZAAR) 100-25 MG tablet Take 1 tablet by mouth daily. 09/14/20   [provider]  metFORMIN (GLUCOPHAGE) 1000  MG tablet Take 1,000 mg by mouth 2 (two) times daily. 06/15/20   [provider]  metoprolol succinate (TOPROL-XL) 100 MG 24 hr tablet Take 100 mg by mouth daily. 11/09/20   [provider]  NIFEdipine (PROCARDIA-XL/NIFEDICAL-XL) 30 MG 24 hr tablet Take 30 mg by mouth daily. 09/16/20   [provider]  tamsulosin (FLOMAX) 0.4 MG CAPS capsule 1 Capsule by mouth daily Patient taking differently: Take 0.4 mg by mouth at bedtime. 11/02/20     furosemide (LASIX) 20 MG tablet Take 1-2 tablets in the morning for your ankle and foot swelling. Patient not taking: No sig  reported 11/12/20 11/27/20  Mardella Layman, MD    Physical Exam: Vitals:   11/26/20 2030 11/26/20 2248 11/26/20 2300 11/26/20 2315  BP: (!) 163/90 (!) 123/99 (!) 169/95 (!) 161/102  Pulse: (!) 106 (!) 103 (!) 103 (!) 101  Resp: 18 18  18   Temp:  97.7 F (36.5 C)    TempSrc:      SpO2: 96% 99% 95% 94%  Weight:      Height:        Constitutional: NAD, calm, comfortable Eyes: PERRL, lids and conjunctivae normal ENMT: Mucous membranes are moist. Posterior pharynx clear of any exudate or lesions.Normal dentition.  Neck: normal, supple, no masses, no thyromegaly Respiratory: clear to auscultation bilaterally, no wheezing, no crackles. Normal respiratory effort. No accessory muscle use.  Cardiovascular: Regular rate and rhythm, no murmurs / rubs / gallops. No extremity edema. 2+ pedal pulses. No carotid bruits.  Abdomen: no tenderness, no masses palpated. No hepatosplenomegaly. Bowel sounds positive.  Musculoskeletal: no clubbing / cyanosis. No joint deformity upper and lower extremities. Good ROM, no contractures. Normal muscle tone.  Skin: Erythema with subq collection of posterior R ankle.  Significant edema to foot and ankle on R.  Limited ROM. Neurologic: CN 2-12 grossly intact. Sensation intact, DTR normal. Strength 5/5 in all 4.  Psychiatric: Normal judgment and insight. Alert and oriented x 3. Normal mood.    Labs on Admission: I have personally reviewed following labs and imaging studies  CBC: Recent Labs  Lab 11/22/20 1657 11/26/20 1839  WBC 9.2 13.4*  NEUTROABS 7.0 11.7*  HGB 14.4 12.3*  HCT 42.5 36.1*  MCV 81.3 80.0  PLT 251 226   Basic Metabolic Panel: Recent Labs  Lab 11/22/20 1657 11/26/20 1839  NA 129* 128*  K 4.0 5.1  CL 92* 93*  CO2 29 22  GLUCOSE 326* 462*  BUN 16 18  CREATININE 1.10 1.05  CALCIUM 9.2 9.0   GFR: Estimated Creatinine Clearance: 73.8 mL/min (by C-G formula based on SCr of 1.05 mg/dL). Liver Function Tests: Recent Labs  Lab  11/22/20 1657 11/26/20 1839  AST 16 31  ALT 17 22  ALKPHOS 72 75  BILITOT 0.6 1.0  PROT 8.1 7.5  ALBUMIN 3.1* 3.0*   No results for input(s): LIPASE, AMYLASE in the last 168 hours. No results for input(s): AMMONIA in the last 168 hours. Coagulation Profile: Recent Labs  Lab 11/26/20 1839  INR 1.2   Cardiac Enzymes: No results for input(s): CKTOTAL, CKMB, CKMBINDEX, TROPONINI in the last 168 hours. BNP (last 3 results) No results for input(s): PROBNP in the last 8760 hours. HbA1C: No results for input(s): HGBA1C in the last 72 hours. CBG: Recent Labs  Lab 11/26/20 2042 11/26/20 2246  GLUCAP 386* 224*   Lipid Profile: No results for input(s): CHOL, HDL, LDLCALC, TRIG, CHOLHDL, LDLDIRECT in the last 72 hours. Thyroid Function  Tests: No results for input(s): TSH, T4TOTAL, FREET4, T3FREE, THYROIDAB in the last 72 hours. Anemia Panel: No results for input(s): VITAMINB12, FOLATE, FERRITIN, TIBC, IRON, RETICCTPCT in the last 72 hours. Urine analysis:    Component Value Date/Time   COLORURINE STRAW (A) 11/26/2020 1900   APPEARANCEUR CLEAR 11/26/2020 1900   LABSPEC 1.028 11/26/2020 1900   PHURINE 6.0 11/26/2020 1900   GLUCOSEU >=500 (A) 11/26/2020 1900   HGBUR MODERATE (A) 11/26/2020 1900   BILIRUBINUR NEGATIVE 11/26/2020 1900   KETONESUR 20 (A) 11/26/2020 1900   PROTEINUR 30 (A) 11/26/2020 1900   NITRITE NEGATIVE 11/26/2020 1900   LEUKOCYTESUR NEGATIVE 11/26/2020 1900    Radiological Exams on Admission: CT Head Wo Contrast  Result Date: 11/26/2020 CLINICAL DATA:  Head trauma, minor (Age >= 65y) EXAM: CT HEAD WITHOUT CONTRAST TECHNIQUE: Contiguous axial images were obtained from the base of the skull through the vertex without intravenous contrast. COMPARISON:  None. FINDINGS: Brain: Patchy and confluent areas of decreased attenuation are noted throughout the deep and periventricular white matter of the cerebral hemispheres bilaterally, compatible with chronic  microvascular ischemic disease. No evidence of large-territorial acute infarction. No parenchymal hemorrhage. No mass lesion. No extra-axial collection. No mass effect or midline shift. No hydrocephalus. Basilar cisterns are patent. Vascular: No hyperdense vessel. Skull: No acute fracture or focal lesion. Sinuses/Orbits: Paranasal sinuses and mastoid air cells are clear. The orbits are unremarkable. Other: None. IMPRESSION: No acute intracranial abnormality. Electronically Signed   By: Tish Frederickson M.D.   On: 11/26/2020 20:00   DG Chest Port 1 View  Result Date: 11/26/2020 CLINICAL DATA:  Foot infection EXAM: PORTABLE CHEST 1 VIEW COMPARISON:  None. FINDINGS: The heart size and mediastinal contours are within normal limits. Both lungs are clear. The visualized skeletal structures are unremarkable. IMPRESSION: No active disease. Electronically Signed   By: Jasmine Pang M.D.   On: 11/26/2020 19:26    EKG: Independently reviewed.  Assessment/Plan Principal Problem:   Osteomyelitis of right foot (HCC) Active Problems:   Hyperlipidemia   DM2 (diabetes mellitus, type 2) (HCC)   Hypertension    Osteomyelitis of R foot - Seen on MRI Admitting pt LE wound pathway Empiric rocephin + flagyl for the moment MRSA PCR nares Wound culture BCx Call ortho surgery in AM DM2 - Lantus 30u QHS (first dose now) 6u novolog mealtime Mod scale SSI ac Check A1C DM coordinator consult HTN - Pt admits to not being regular about taking BP meds at home Therefore will start with just losartan 100 mg daily for the moment Keep eye on renal function Add other BP meds in as needed HLD - Cont statin  DVT prophylaxis: Lovenox Code Status: Full Family Communication: No family in room Disposition Plan: Home after treatment for osteomyelitis of R foot Consults called: None Admission status: Admit to inpatient  Severity of Illness: The appropriate patient status for this patient is INPATIENT. Inpatient  status is judged to be reasonable and necessary in order to provide the required intensity of service to ensure the patient's safety. The patient's presenting symptoms, physical exam findings, and initial radiographic and laboratory data in the context of their chronic comorbidities is felt to place them at high risk for further clinical deterioration. Furthermore, it is not anticipated that the patient will be medically stable for discharge from the hospital within 2 midnights of admission. The following factors support the patient status of inpatient.   IP status due to: 1) limb threatening infection of R foot (osteomyelitis)  2) pt clearly has failed outpt ABx treatment at this point.   * I certify that at the point of admission it is my clinical judgment that the patient will require inpatient hospital care spanning beyond 2 midnights from the point of admission due to high intensity of service, high risk for further deterioration and high frequency of surveillance required.*   Runell Kovich M. DO [facility]  How to contact the [facility] Attending or Consulting provider 7A - 7P or covering provider during after hours 7P -7A, for this patient?  Check the care team in [facility] and look for a) attending/consulting [facility] provider listed and b) the [facility] team listed Log into [email or web address]  Am[employer] Physician Scheduling and messaging for groups and whole hospitals  On call and physician scheduling software for group practices, residents, hospitalists and other medical providers for call, clinic, rotation and shift schedules. On[employer] is a hospital-wide system for scheduling doctors and paging doctors on call. EasyPlot is for scientific plotting and data analysis.  [email or web address]  and use [place]'s universal password to access. If you do not have the password, please contact the hospital operator.  Locate the [facility] provider you are looking for under [facility] and page to a number that you  can be directly reached. If you still have difficulty reaching the provider, please page the [facility] (Director on Call) for the Hospitalists listed on amion for assistance.  11/27/2020, 1:14 AM

## 2020-11-27 NOTE — Progress Notes (Signed)
Introduced myself to patient and explained that I am doing the nursing admission history for the nurses. Pt answered a few of the questions and then he started talking about his foot. He states he does not want to answer any more questions. He states he has been in and out of the hospital and he is bombarded with questions. States no one has done anything to help him.He states he keeps coming to the hospital and no one does anything for him.  Unable to complete nursing admission history. Briscoe Burns BSN, RN-BC Admissions RN 11/27/2020 3:46 PM

## 2020-11-27 NOTE — Progress Notes (Signed)
Pharmacy Antibiotic Note  Cory [name] is a 75 y.o. male admitted on 11/26/2020 with sepsis and osteomyelitis.  Pharmacy has been consulted for vancomycin and  Zosyn dosing.  Plan: Zosyn 3.375g IV q8h (4 hour infusion).  Vancomycin 1000 mg iv q 12 hours to target a predicted AUC of 466 using SCr of 1 (actual 0.83).   Will f/u renal function, culture results, and clinical course Levels if/when indicated   Height: 5\' 11"  (180.3 cm) Weight: 95.3 kg (210 lb) IBW/kg (Calculated) : 75.3  Temp (24hrs), Avg:97.7 F (36.5 C), Min:97.7 F (36.5 C), Max:97.7 F (36.5 C)  Recent Labs  Lab 11/22/20 1657 11/26/20 1839 11/26/20 2000 11/27/20 0456  WBC 9.2 13.4*  --  12.3*  CREATININE 1.10 1.05  --  0.83  LATICACIDVEN  --  1.7 1.2  --     Estimated Creatinine Clearance: 93.4 mL/min (by C-G formula based on SCr of 0.83 mg/dL).    No Known Allergies  Thank you for allowing pharmacy to be a part of this patient's care.  11/29/20 11/27/2020 7:39 PM

## 2020-11-27 NOTE — Progress Notes (Signed)
Inpatient Diabetes Program Recommendations  AACE/ADA: New Consensus Statement on Inpatient Glycemic Control (2015)  Target Ranges:  Prepandial:   less than 140 mg/dL      Peak postprandial:   less than 180 mg/dL (1-2 hours)      Critically ill patients:  140 - 180 mg/dL   Lab Results  Component Value Date   GLUCAP 161 (H) 11/27/2020    Review of Glycemic Control  Diabetes history: DM2 Outpatient Diabetes medications: Lantus 45 units QD, metformin 1000 mg BID, Trulicity 0.75 (has only had 1 dose), Farxiga 10 mg QD (not taking) Current orders for Inpatient glycemic control:  Lantus 30 units QHS, Novolog 0-9 units Q4H  HgbA1C - pending 243, 211, 161 mg/dL Pt is NPO Needs tight glycemic control for healing.   Inpatient Diabetes Program Recommendations:    Increase Novolog to 0-15 units Q4H If FBS > 180 mg/dL, increase Lantus to 35 units QHS  Spoke with pt at bedside regarding his diabetes and home meds. Pt states he only takes Lantus 45 units QD and metformin 1000 mg BID. Monitors blood sugars every morning. [place] Spaniel he takes Lantus most of the time. Very frustrated with NPO status and said he was supposed to be transferred upstairs earlier. Sees PCP for diabetes management. Did not know what HgbA1C was.  Pt not wanting to talk anymore.   Continue to follow.   Thank you. Ailene Ards, RD, LDN, CDE Inpatient Diabetes Coordinator [phone]

## 2020-11-28 DIAGNOSIS — M869 Osteomyelitis, unspecified: Secondary | ICD-10-CM | POA: Diagnosis not present

## 2020-11-28 DIAGNOSIS — A419 Sepsis, unspecified organism: Secondary | ICD-10-CM | POA: Diagnosis not present

## 2020-11-28 LAB — CBC
HCT: 36.3 % — ABNORMAL LOW (ref 39.0–52.0)
Hemoglobin: 12.2 g/dL — ABNORMAL LOW (ref 13.0–17.0)
MCH: 27.1 pg (ref 26.0–34.0)
MCHC: 33.6 g/dL (ref 30.0–36.0)
MCV: 80.5 fL (ref 80.0–100.0)
Platelets: 226 10*3/uL (ref 150–400)
RBC: 4.51 MIL/uL (ref 4.22–5.81)
RDW: 12.4 % (ref 11.5–15.5)
WBC: 11.9 10*3/uL — ABNORMAL HIGH (ref 4.0–10.5)
nRBC: 0 % (ref 0.0–0.2)

## 2020-11-28 LAB — CBG MONITORING, ED
Glucose-Capillary: 127 mg/dL — ABNORMAL HIGH (ref 70–99)
Glucose-Capillary: 154 mg/dL — ABNORMAL HIGH (ref 70–99)
Glucose-Capillary: 150 mg/dL — ABNORMAL HIGH (ref 70–99)

## 2020-11-28 LAB — COMPREHENSIVE METABOLIC PANEL
ALT: 36 U/L (ref 0–44)
AST: 75 U/L — ABNORMAL HIGH (ref 15–41)
Albumin: 2.6 g/dL — ABNORMAL LOW (ref 3.5–5.0)
Alkaline Phosphatase: 70 U/L (ref 38–126)
Anion gap: 11 (ref 5–15)
BUN: 12 mg/dL (ref 8–23)
CO2: 25 mmol/L (ref 22–32)
Calcium: 8.3 mg/dL — ABNORMAL LOW (ref 8.9–10.3)
Chloride: 93 mmol/L — ABNORMAL LOW (ref 98–111)
Creatinine, Ser: 0.76 mg/dL (ref 0.61–1.24)
GFR, Estimated: >60 mL/min (ref >60)
Glucose, Bld: 150 mg/dL — ABNORMAL HIGH (ref 70–99)
Potassium: 3.7 mmol/L (ref 3.5–5.1)
Sodium: 129 mmol/L — ABNORMAL LOW (ref 135–145)
Total Bilirubin: 0.9 mg/dL (ref 0.3–1.2)
Total Protein: 7 g/dL (ref 6.5–8.1)

## 2020-11-28 LAB — GLUCOSE, CAPILLARY
Glucose-Capillary: 165 mg/dL — ABNORMAL HIGH (ref 70–99)
Glucose-Capillary: 253 mg/dL — ABNORMAL HIGH (ref 70–99)
Glucose-Capillary: 242 mg/dL — ABNORMAL HIGH (ref 70–99)

## 2020-11-28 NOTE — Progress Notes (Addendum)
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   Initial MRI noted some concerns for possible gas-forming soft tissue concerning for necrotizing fasciitis.  Discussed with Ortho and this is likely from recent attempted arthrocentesis done 5 days ago.   --Continue empiric Vanc and Zosyn --Ortho consulted, to see later today or tomorrow AM --Diet resumed with NPO after midnight --Pain control PRN --Follow cultures  Hyponatremia - Na declining a bit, possible duet o NPO status.  On diet for today.  Recheck BMP and further evaluation depending on trend.  Hyperlipidemia - on statin.   Uncontrolled diabetes mellitus -  --sliding scale Novolog --A1c pending   Hypertension: Elevated BP's may be due to pain --resumed on home meds.     Overweight: Body mass index is 29.29 kg/m.   DVT prophylaxis: Place and maintain sequential compression device Start: 11/27/20 1919   Diet:  Diet Orders (From admission, onward)     Start     Ordered   11/29/20 0001  Diet NPO time specified  Diet effective midnight        11/28/20 1215   11/28/20 1214  Diet Carb Modified Fluid consistency: Thin; Room service appropriate? Yes  Diet effective now       Question Answer Comment  Diet-HS Snack? Nothing   Calorie Level Medium 1600-2000   Fluid consistency: Thin   Room service appropriate? Yes      11/28/20 1213              Code Status: Full Code   Brief Narrative / Hospital Course to Date:   75 year old male with past medical history of poorly controlled  diabetes mellitus, hypertension and hyperlipidemia who developed issues with some right ankle and foot pain with infection a month ago with symptoms persisting despite doses of antibiotics, x-rays, DVT ruled out and an unsuccessful ankle arthrocentesis on 7/17.  Presented to the ED on the evening of 7/21 with persistent complaints and found to have sepsis.  MRI of right foot/ankle revealed osteomyelitis.  Patient started on IV antibiotics and admitted to the hospitalist service on the morning of 7/22. Orthopedic surgery consulted.  Recent ABI's show good arterial flow in distal lower extremities.   Subjective 11/28/20    Pt reports being very hungry, wants to eat.  Niece arrived at bedside, inquires about when surgery will be.  Pt denies fever or chills.  Having moderate pain in the foot.  No other acute complaints.    Disposition Plan & Communication   Status is: Inpatient  Remains inpatient appropriate because:IV treatments appropriate due to intensity of illness or inability to take PO  Dispo: The patient is from: Home              Anticipated d/c is to: Home              Patient currently is not medically stable to d/c.   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   Initial MRI noted some concerns for possible gas-forming soft tissue concerning for necrotizing fasciitis.  Discussed with Ortho and this is likely from recent attempted arthrocentesis done 5 days ago.   --Continue empiric Vanc and Zosyn --Ortho consulted, to see later today or tomorrow AM --Diet resumed with NPO after midnight --Pain control PRN --Follow cultures  Hyponatremia - Na declining a bit, possible duet o NPO status.  On diet for today.  Recheck BMP and further evaluation depending on trend.  Hyperlipidemia - on statin.   Uncontrolled diabetes mellitus -  --sliding scale Novolog --A1c pending   Hypertension: Elevated BP's may be due to pain --resumed on home meds.     Overweight: Body mass index is 29.29 kg/m.   DVT prophylaxis: Place and maintain sequential compression device Start: 11/27/20 1919   Diet:  Diet Orders (From admission, onward)     Start     Ordered   11/29/20 0001  Diet NPO time specified  Diet effective midnight        11/28/20 1215   11/28/20 1214  Diet Carb Modified Fluid consistency: Thin; Room service appropriate? Yes  Diet effective now       Question Answer Comment  Diet-HS Snack? Nothing   Calorie Level Medium 1600-2000   Fluid consistency: Thin   Room service appropriate? Yes      11/28/20 1213              Code Status: Full Code   Brief Narrative / Hospital Course to Date:   75 year old male with past medical history of poorly controlled  diabetes mellitus, hypertension and hyperlipidemia who developed issues with some right ankle and foot pain with infection a month ago with symptoms persisting despite doses of antibiotics, x-rays, DVT ruled out and an unsuccessful ankle arthrocentesis on 7/17.  Presented to the ED on the evening of 7/21 with persistent complaints and found to have sepsis.  MRI of right foot/ankle revealed osteomyelitis.  Patient started on IV antibiotics and admitted to the hospitalist service on the morning of 7/22. Orthopedic surgery consulted.  Recent ABI's show good arterial flow in distal lower extremities.   Subjective 11/28/20    Pt reports being very hungry, wants to eat.  Niece arrived at bedside, inquires about when surgery will be.  Pt denies fever or chills.  Having moderate pain in the foot.  No other acute complaints.    Disposition Plan & Communication   Status is: Inpatient  Remains inpatient appropriate because:IV treatments appropriate due to intensity of illness or inability to take PO  Dispo: The patient is from: Home              Anticipated d/c is to: Home              Patient currently is not medically stable to d/c.   Difficult to place patient No   Family Communication: niece at bedside on rounds    Consults, Procedures, Significant Events  the soft tissues adjacent to the lateral calcaneus and including a few small foci of susceptibility within the bone at this level (see series 7, images 22-23). Remaining  osseous structures are within normal limits. No acute fracture. No dislocation. No additional sites of osteomyelitis. Soft tissues: Soft tissue irregularity at the posterolateral aspect of the ankle, likely reflecting a site of ulceration or wound. Extensive ill-defined fluid within the soft tissues at the posterolateral ankle. Numerous tiny foci of susceptibility are suggestive of soft tissue gas. The susceptibility extends proximally adjacent to the lateral margin of the distal Achilles tendon to approximately the level of the distal tibial metaphysis. No well-defined or rim enhancing fluid collections. Diffuse intramuscular edema within the intrinsic foot musculature suggesting myositis. IMPRESSION: IMPRESSION 1. Soft tissue irregularity at the posterolateral aspect of the ankle, likely reflecting a site of ulceration or wound. Extensive ill-defined fluid within the soft tissues at the posterolateral ankle compatible with cellulitis. Numerous tiny foci of susceptibility throughout the soft tissues within this region likely reflecting soft tissue gas. No well-defined or rim-enhancing fluid collections. Findings are highly suspicious for a necrotizing soft tissue infection. Radiographic correlation of the right ankle and right tibia-fibula are recommended to assess for the extent of soft tissue gas. 2. Acute osteomyelitis involving the lateral aspect of the calcaneal body. 3. Tendinosis with long segment longitudinal split tear of the peroneus brevis tendon. Trace tenosynovitis within the peroneal tendon sheaths, which may be infectious. 4. Tendinosis versus reactive changes along the lateral aspect of the distal Achilles tendon. 5. Diffuse intramuscular edema within the intrinsic foot musculature suggesting myositis. These results will be called to the ordering clinician or representative by the Radiologist Assistant, and communication documented in the PACS or [employer]. Electronically Signed   By:  Davina Poke D.O.   On: 11/27/2020 07:17   DG Chest Port 1 View  Result Date: 11/26/2020 CLINICAL DATA:  Foot infection EXAM: PORTABLE CHEST 1 VIEW COMPARISON:  None. FINDINGS: The heart size and mediastinal contours are within normal limits. Both lungs are clear. The visualized skeletal structures are unremarkable. IMPRESSION: No active disease. Electronically Signed   By: Donavan Foil M.D.   On: 11/26/2020 19:26   VAS [place] ABI WITH/WO TBI  Result Date: 11/27/2020  LOWER EXTREMITY DOPPLER STUDY Patient Name:  Cory [name]  Date of Exam:   11/27/2020 Medical Rec #: [id number]     Accession #:    [id number] Date of Birth: [birth year]     Patient Gender: M Patient Age:   74Y Exam Location:  [facility] Procedure:      VAS [place] ABI WITH/WO TBI Referring Phys: [id number] JARED M GARDNER --------------------------------------------------------------------------------  Indications: Ulceration. High Risk Factors: Diabetes.  Limitations: Today's exam was limited due to an open wound and patient              intolerant to cuff pressure. Comparison Study: No prior studies. Performing Technologist: Carlos Levering RVT  Examination Guidelines: A complete evaluation includes at minimum, Doppler waveform signals and systolic blood pressure reading at the level of bilateral brachial, anterior tibial, and posterior tibial arteries, when vessel segments are accessible. Bilateral testing is considered an integral part of a complete examination. Photoelectric Plethysmograph (PPG) waveforms and toe systolic pressure readings are included as required and additional duplex testing as needed. Limited examinations for reoccurring indications may be performed as noted.  ABI Findings: +---------+------------------+-----+---------+--------+ Right    Rt Pressure (mmHg)IndexWaveform Comment  +---------+------------------+-----+---------+--------+ Brachial 161  the soft tissues adjacent to the lateral calcaneus and including a few small foci of susceptibility within the bone at this level (see series 7, images 22-23). Remaining  osseous structures are within normal limits. No acute fracture. No dislocation. No additional sites of osteomyelitis. Soft tissues: Soft tissue irregularity at the posterolateral aspect of the ankle, likely reflecting a site of ulceration or wound. Extensive ill-defined fluid within the soft tissues at the posterolateral ankle. Numerous tiny foci of susceptibility are suggestive of soft tissue gas. The susceptibility extends proximally adjacent to the lateral margin of the distal Achilles tendon to approximately the level of the distal tibial metaphysis. No well-defined or rim enhancing fluid collections. Diffuse intramuscular edema within the intrinsic foot musculature suggesting myositis. IMPRESSION: IMPRESSION 1. Soft tissue irregularity at the posterolateral aspect of the ankle, likely reflecting a site of ulceration or wound. Extensive ill-defined fluid within the soft tissues at the posterolateral ankle compatible with cellulitis. Numerous tiny foci of susceptibility throughout the soft tissues within this region likely reflecting soft tissue gas. No well-defined or rim-enhancing fluid collections. Findings are highly suspicious for a necrotizing soft tissue infection. Radiographic correlation of the right ankle and right tibia-fibula are recommended to assess for the extent of soft tissue gas. 2. Acute osteomyelitis involving the lateral aspect of the calcaneal body. 3. Tendinosis with long segment longitudinal split tear of the peroneus brevis tendon. Trace tenosynovitis within the peroneal tendon sheaths, which may be infectious. 4. Tendinosis versus reactive changes along the lateral aspect of the distal Achilles tendon. 5. Diffuse intramuscular edema within the intrinsic foot musculature suggesting myositis. These results will be called to the ordering clinician or representative by the Radiologist Assistant, and communication documented in the PACS or [employer]. Electronically Signed   By:  Davina Poke D.O.   On: 11/27/2020 07:17   DG Chest Port 1 View  Result Date: 11/26/2020 CLINICAL DATA:  Foot infection EXAM: PORTABLE CHEST 1 VIEW COMPARISON:  None. FINDINGS: The heart size and mediastinal contours are within normal limits. Both lungs are clear. The visualized skeletal structures are unremarkable. IMPRESSION: No active disease. Electronically Signed   By: Donavan Foil M.D.   On: 11/26/2020 19:26   VAS [place] ABI WITH/WO TBI  Result Date: 11/27/2020  LOWER EXTREMITY DOPPLER STUDY Patient Name:  Cory [name]  Date of Exam:   11/27/2020 Medical Rec #: [id number]     Accession #:    [id number] Date of Birth: [birth year]     Patient Gender: M Patient Age:   74Y Exam Location:  [facility] Procedure:      VAS [place] ABI WITH/WO TBI Referring Phys: [id number] JARED M GARDNER --------------------------------------------------------------------------------  Indications: Ulceration. High Risk Factors: Diabetes.  Limitations: Today's exam was limited due to an open wound and patient              intolerant to cuff pressure. Comparison Study: No prior studies. Performing Technologist: Carlos Levering RVT  Examination Guidelines: A complete evaluation includes at minimum, Doppler waveform signals and systolic blood pressure reading at the level of bilateral brachial, anterior tibial, and posterior tibial arteries, when vessel segments are accessible. Bilateral testing is considered an integral part of a complete examination. Photoelectric Plethysmograph (PPG) waveforms and toe systolic pressure readings are included as required and additional duplex testing as needed. Limited examinations for reoccurring indications may be performed as noted.  ABI Findings: +---------+------------------+-----+---------+--------+ Right    Rt Pressure (mmHg)IndexWaveform Comment  +---------+------------------+-----+---------+--------+ Brachial 161  the soft tissues adjacent to the lateral calcaneus and including a few small foci of susceptibility within the bone at this level (see series 7, images 22-23). Remaining  osseous structures are within normal limits. No acute fracture. No dislocation. No additional sites of osteomyelitis. Soft tissues: Soft tissue irregularity at the posterolateral aspect of the ankle, likely reflecting a site of ulceration or wound. Extensive ill-defined fluid within the soft tissues at the posterolateral ankle. Numerous tiny foci of susceptibility are suggestive of soft tissue gas. The susceptibility extends proximally adjacent to the lateral margin of the distal Achilles tendon to approximately the level of the distal tibial metaphysis. No well-defined or rim enhancing fluid collections. Diffuse intramuscular edema within the intrinsic foot musculature suggesting myositis. IMPRESSION: IMPRESSION 1. Soft tissue irregularity at the posterolateral aspect of the ankle, likely reflecting a site of ulceration or wound. Extensive ill-defined fluid within the soft tissues at the posterolateral ankle compatible with cellulitis. Numerous tiny foci of susceptibility throughout the soft tissues within this region likely reflecting soft tissue gas. No well-defined or rim-enhancing fluid collections. Findings are highly suspicious for a necrotizing soft tissue infection. Radiographic correlation of the right ankle and right tibia-fibula are recommended to assess for the extent of soft tissue gas. 2. Acute osteomyelitis involving the lateral aspect of the calcaneal body. 3. Tendinosis with long segment longitudinal split tear of the peroneus brevis tendon. Trace tenosynovitis within the peroneal tendon sheaths, which may be infectious. 4. Tendinosis versus reactive changes along the lateral aspect of the distal Achilles tendon. 5. Diffuse intramuscular edema within the intrinsic foot musculature suggesting myositis. These results will be called to the ordering clinician or representative by the Radiologist Assistant, and communication documented in the PACS or [employer]. Electronically Signed   By:  Davina Poke D.O.   On: 11/27/2020 07:17   DG Chest Port 1 View  Result Date: 11/26/2020 CLINICAL DATA:  Foot infection EXAM: PORTABLE CHEST 1 VIEW COMPARISON:  None. FINDINGS: The heart size and mediastinal contours are within normal limits. Both lungs are clear. The visualized skeletal structures are unremarkable. IMPRESSION: No active disease. Electronically Signed   By: Donavan Foil M.D.   On: 11/26/2020 19:26   VAS [place] ABI WITH/WO TBI  Result Date: 11/27/2020  LOWER EXTREMITY DOPPLER STUDY Patient Name:  Cory [name]  Date of Exam:   11/27/2020 Medical Rec #: [id number]     Accession #:    [id number] Date of Birth: [birth year]     Patient Gender: M Patient Age:   74Y Exam Location:  [facility] Procedure:      VAS [place] ABI WITH/WO TBI Referring Phys: [id number] JARED M GARDNER --------------------------------------------------------------------------------  Indications: Ulceration. High Risk Factors: Diabetes.  Limitations: Today's exam was limited due to an open wound and patient              intolerant to cuff pressure. Comparison Study: No prior studies. Performing Technologist: Carlos Levering RVT  Examination Guidelines: A complete evaluation includes at minimum, Doppler waveform signals and systolic blood pressure reading at the level of bilateral brachial, anterior tibial, and posterior tibial arteries, when vessel segments are accessible. Bilateral testing is considered an integral part of a complete examination. Photoelectric Plethysmograph (PPG) waveforms and toe systolic pressure readings are included as required and additional duplex testing as needed. Limited examinations for reoccurring indications may be performed as noted.  ABI Findings: +---------+------------------+-----+---------+--------+ Right    Rt Pressure (mmHg)IndexWaveform Comment  +---------+------------------+-----+---------+--------+ Brachial 161  the soft tissues adjacent to the lateral calcaneus and including a few small foci of susceptibility within the bone at this level (see series 7, images 22-23). Remaining  osseous structures are within normal limits. No acute fracture. No dislocation. No additional sites of osteomyelitis. Soft tissues: Soft tissue irregularity at the posterolateral aspect of the ankle, likely reflecting a site of ulceration or wound. Extensive ill-defined fluid within the soft tissues at the posterolateral ankle. Numerous tiny foci of susceptibility are suggestive of soft tissue gas. The susceptibility extends proximally adjacent to the lateral margin of the distal Achilles tendon to approximately the level of the distal tibial metaphysis. No well-defined or rim enhancing fluid collections. Diffuse intramuscular edema within the intrinsic foot musculature suggesting myositis. IMPRESSION: IMPRESSION 1. Soft tissue irregularity at the posterolateral aspect of the ankle, likely reflecting a site of ulceration or wound. Extensive ill-defined fluid within the soft tissues at the posterolateral ankle compatible with cellulitis. Numerous tiny foci of susceptibility throughout the soft tissues within this region likely reflecting soft tissue gas. No well-defined or rim-enhancing fluid collections. Findings are highly suspicious for a necrotizing soft tissue infection. Radiographic correlation of the right ankle and right tibia-fibula are recommended to assess for the extent of soft tissue gas. 2. Acute osteomyelitis involving the lateral aspect of the calcaneal body. 3. Tendinosis with long segment longitudinal split tear of the peroneus brevis tendon. Trace tenosynovitis within the peroneal tendon sheaths, which may be infectious. 4. Tendinosis versus reactive changes along the lateral aspect of the distal Achilles tendon. 5. Diffuse intramuscular edema within the intrinsic foot musculature suggesting myositis. These results will be called to the ordering clinician or representative by the Radiologist Assistant, and communication documented in the PACS or [employer]. Electronically Signed   By:  Davina Poke D.O.   On: 11/27/2020 07:17   DG Chest Port 1 View  Result Date: 11/26/2020 CLINICAL DATA:  Foot infection EXAM: PORTABLE CHEST 1 VIEW COMPARISON:  None. FINDINGS: The heart size and mediastinal contours are within normal limits. Both lungs are clear. The visualized skeletal structures are unremarkable. IMPRESSION: No active disease. Electronically Signed   By: Donavan Foil M.D.   On: 11/26/2020 19:26   VAS [place] ABI WITH/WO TBI  Result Date: 11/27/2020  LOWER EXTREMITY DOPPLER STUDY Patient Name:  Cory [name]  Date of Exam:   11/27/2020 Medical Rec #: [id number]     Accession #:    [id number] Date of Birth: [birth year]     Patient Gender: M Patient Age:   74Y Exam Location:  [facility] Procedure:      VAS [place] ABI WITH/WO TBI Referring Phys: [id number] JARED M GARDNER --------------------------------------------------------------------------------  Indications: Ulceration. High Risk Factors: Diabetes.  Limitations: Today's exam was limited due to an open wound and patient              intolerant to cuff pressure. Comparison Study: No prior studies. Performing Technologist: Carlos Levering RVT  Examination Guidelines: A complete evaluation includes at minimum, Doppler waveform signals and systolic blood pressure reading at the level of bilateral brachial, anterior tibial, and posterior tibial arteries, when vessel segments are accessible. Bilateral testing is considered an integral part of a complete examination. Photoelectric Plethysmograph (PPG) waveforms and toe systolic pressure readings are included as required and additional duplex testing as needed. Limited examinations for reoccurring indications may be performed as noted.  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   Initial MRI noted some concerns for possible gas-forming soft tissue concerning for necrotizing fasciitis.  Discussed with Ortho and this is likely from recent attempted arthrocentesis done 5 days ago.   --Continue empiric Vanc and Zosyn --Ortho consulted, to see later today or tomorrow AM --Diet resumed with NPO after midnight --Pain control PRN --Follow cultures  Hyponatremia - Na declining a bit, possible duet o NPO status.  On diet for today.  Recheck BMP and further evaluation depending on trend.  Hyperlipidemia - on statin.   Uncontrolled diabetes mellitus -  --sliding scale Novolog --A1c pending   Hypertension: Elevated BP's may be due to pain --resumed on home meds.     Overweight: Body mass index is 29.29 kg/m.   DVT prophylaxis: Place and maintain sequential compression device Start: 11/27/20 1919   Diet:  Diet Orders (From admission, onward)     Start     Ordered   11/29/20 0001  Diet NPO time specified  Diet effective midnight        11/28/20 1215   11/28/20 1214  Diet Carb Modified Fluid consistency: Thin; Room service appropriate? Yes  Diet effective now       Question Answer Comment  Diet-HS Snack? Nothing   Calorie Level Medium 1600-2000   Fluid consistency: Thin   Room service appropriate? Yes      11/28/20 1213              Code Status: Full Code   Brief Narrative / Hospital Course to Date:   75 year old male with past medical history of poorly controlled  diabetes mellitus, hypertension and hyperlipidemia who developed issues with some right ankle and foot pain with infection a month ago with symptoms persisting despite doses of antibiotics, x-rays, DVT ruled out and an unsuccessful ankle arthrocentesis on 7/17.  Presented to the ED on the evening of 7/21 with persistent complaints and found to have sepsis.  MRI of right foot/ankle revealed osteomyelitis.  Patient started on IV antibiotics and admitted to the hospitalist service on the morning of 7/22. Orthopedic surgery consulted.  Recent ABI's show good arterial flow in distal lower extremities.   Subjective 11/28/20    Pt reports being very hungry, wants to eat.  Niece arrived at bedside, inquires about when surgery will be.  Pt denies fever or chills.  Having moderate pain in the foot.  No other acute complaints.    Disposition Plan & Communication   Status is: Inpatient  Remains inpatient appropriate because:IV treatments appropriate due to intensity of illness or inability to take PO  Dispo: The patient is from: Home              Anticipated d/c is to: Home              Patient currently is not medically stable to d/c.   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   Initial MRI noted some concerns for possible gas-forming soft tissue concerning for necrotizing fasciitis.  Discussed with Ortho and this is likely from recent attempted arthrocentesis done 5 days ago.   --Continue empiric Vanc and Zosyn --Ortho consulted, to see later today or tomorrow AM --Diet resumed with NPO after midnight --Pain control PRN --Follow cultures  Hyponatremia - Na declining a bit, possible duet o NPO status.  On diet for today.  Recheck BMP and further evaluation depending on trend.  Hyperlipidemia - on statin.   Uncontrolled diabetes mellitus -  --sliding scale Novolog --A1c pending   Hypertension: Elevated BP's may be due to pain --resumed on home meds.     Overweight: Body mass index is 29.29 kg/m.   DVT prophylaxis: Place and maintain sequential compression device Start: 11/27/20 1919   Diet:  Diet Orders (From admission, onward)     Start     Ordered   11/29/20 0001  Diet NPO time specified  Diet effective midnight        11/28/20 1215   11/28/20 1214  Diet Carb Modified Fluid consistency: Thin; Room service appropriate? Yes  Diet effective now       Question Answer Comment  Diet-HS Snack? Nothing   Calorie Level Medium 1600-2000   Fluid consistency: Thin   Room service appropriate? Yes      11/28/20 1213              Code Status: Full Code   Brief Narrative / Hospital Course to Date:   75 year old male with past medical history of poorly controlled  diabetes mellitus, hypertension and hyperlipidemia who developed issues with some right ankle and foot pain with infection a month ago with symptoms persisting despite doses of antibiotics, x-rays, DVT ruled out and an unsuccessful ankle arthrocentesis on 7/17.  Presented to the ED on the evening of 7/21 with persistent complaints and found to have sepsis.  MRI of right foot/ankle revealed osteomyelitis.  Patient started on IV antibiotics and admitted to the hospitalist service on the morning of 7/22. Orthopedic surgery consulted.  Recent ABI's show good arterial flow in distal lower extremities.   Subjective 11/28/20    Pt reports being very hungry, wants to eat.  Niece arrived at bedside, inquires about when surgery will be.  Pt denies fever or chills.  Having moderate pain in the foot.  No other acute complaints.    Disposition Plan & Communication   Status is: Inpatient  Remains inpatient appropriate because:IV treatments appropriate due to intensity of illness or inability to take PO  Dispo: The patient is from: Home              Anticipated d/c is to: Home              Patient currently is not medically stable to d/c.   Difficult to place patient No   Family Communication: niece at bedside on rounds    Consults, Procedures, Significant Events

## 2020-11-28 NOTE — Plan of Care (Signed)
  Problem: Education: Goal: Knowledge of General Education information will improve Description: Including pain rating scale, medication(s)/side effects and non-pharmacologic comfort measures 11/28/2020 0908 by Beverly Sessions, RN Outcome: Progressing 11/28/2020 0908 by Beverly Sessions, RN Outcome: Progressing   Problem: Health Behavior/Discharge Planning: Goal: Ability to manage health-related needs will improve 11/28/2020 0908 by Beverly Sessions, RN Outcome: Progressing 11/28/2020 0908 by Beverly Sessions, RN Outcome: Progressing   Problem: Skin Integrity: Goal: Risk for impaired skin integrity will decrease 11/28/2020 0908 by Beverly Sessions, RN Outcome: Progressing 11/28/2020 0908 by Beverly Sessions, RN Outcome: Progressing

## 2020-11-29 ENCOUNTER — Inpatient Hospital Stay (HOSPITAL_COMMUNITY): Payer: Medicare HMO

## 2020-11-29 DIAGNOSIS — M869 Osteomyelitis, unspecified: Secondary | ICD-10-CM | POA: Diagnosis not present

## 2020-11-29 DIAGNOSIS — I10 Essential (primary) hypertension: Secondary | ICD-10-CM | POA: Diagnosis not present

## 2020-11-29 DIAGNOSIS — A419 Sepsis, unspecified organism: Secondary | ICD-10-CM | POA: Diagnosis not present

## 2020-11-29 LAB — GLUCOSE, CAPILLARY
Glucose-Capillary: 281 mg/dL — ABNORMAL HIGH (ref 70–99)
Glucose-Capillary: 229 mg/dL — ABNORMAL HIGH (ref 70–99)
Glucose-Capillary: 177 mg/dL — ABNORMAL HIGH (ref 70–99)
Glucose-Capillary: 211 mg/dL — ABNORMAL HIGH (ref 70–99)
Glucose-Capillary: 392 mg/dL — ABNORMAL HIGH (ref 70–99)
Glucose-Capillary: 300 mg/dL — ABNORMAL HIGH (ref 70–99)
Glucose-Capillary: 235 mg/dL — ABNORMAL HIGH (ref 70–99)

## 2020-11-29 LAB — BASIC METABOLIC PANEL
Anion gap: 8 (ref 5–15)
BUN: 26 mg/dL — ABNORMAL HIGH (ref 8–23)
CO2: 28 mmol/L (ref 22–32)
Calcium: 8.5 mg/dL — ABNORMAL LOW (ref 8.9–10.3)
Chloride: 92 mmol/L — ABNORMAL LOW (ref 98–111)
Creatinine, Ser: 1.64 mg/dL — ABNORMAL HIGH (ref 0.61–1.24)
GFR, Estimated: 44 mL/min — ABNORMAL LOW (ref >60)
Glucose, Bld: 234 mg/dL — ABNORMAL HIGH (ref 70–99)
Potassium: 3.6 mmol/L (ref 3.5–5.1)
Sodium: 128 mmol/L — ABNORMAL LOW (ref 135–145)

## 2020-11-29 LAB — CBC
HCT: 35.6 % — ABNORMAL LOW (ref 39.0–52.0)
Hemoglobin: 11.9 g/dL — ABNORMAL LOW (ref 13.0–17.0)
MCH: 27.4 pg (ref 26.0–34.0)
MCHC: 33.4 g/dL (ref 30.0–36.0)
MCV: 81.8 fL (ref 80.0–100.0)
Platelets: 230 10*3/uL (ref 150–400)
RBC: 4.35 MIL/uL (ref 4.22–5.81)
RDW: 12.4 % (ref 11.5–15.5)
WBC: 11.3 10*3/uL — ABNORMAL HIGH (ref 4.0–10.5)
nRBC: 0 % (ref 0.0–0.2)

## 2020-11-29 LAB — SODIUM, URINE, RANDOM: Sodium, Ur: <10 mmol/L

## 2020-11-29 LAB — OSMOLALITY: Osmolality: 282 mOsm/kg (ref 275–295)

## 2020-11-29 LAB — OSMOLALITY, URINE: Osmolality, Ur: 435 mOsm/kg (ref 300–900)

## 2020-11-29 LAB — SODIUM: Sodium: 129 mmol/L — ABNORMAL LOW (ref 135–145)

## 2020-11-29 LAB — TSH: TSH: 1.125 u[IU]/mL (ref 0.350–4.500)

## 2020-11-29 MED ORDER — SODIUM CHLORIDE 0.9 % IV SOLN: INTRAVENOUS | Status: DC

## 2020-11-29 MED ORDER — SODIUM CHLORIDE 0.9 % IV SOLN
2.0000 g | Freq: Two times a day (BID) | INTRAVENOUS | Status: DC
  Administered 2020-11-29 – 2020-12-01 (×5): 2 g via INTRAVENOUS
  Filled 2020-11-29 (×6): qty 2

## 2020-11-29 MED ORDER — HYDRALAZINE HCL 25 MG PO TABS: 25.0000 mg | ORAL_TABLET | Freq: Four times a day (QID) | ORAL | Status: DC | PRN

## 2020-11-29 MED ORDER — LINEZOLID 600 MG/300ML IV SOLN
600.0000 mg | Freq: Two times a day (BID) | INTRAVENOUS | Status: DC
  Administered 2020-11-29 – 2020-12-05 (×12): 600 mg via INTRAVENOUS
  Filled 2020-11-29 (×13): qty 300

## 2020-11-29 MED ORDER — PROSOURCE PLUS PO LIQD
30.0000 mL | Freq: Two times a day (BID) | ORAL | Status: DC
  Administered 2020-11-29 – 2020-12-08 (×15): 30 mL via ORAL
  Filled 2020-11-29 (×14): qty 30

## 2020-11-29 MED ORDER — NIFEDIPINE ER OSMOTIC RELEASE 60 MG PO TB24
60.0000 mg | ORAL_TABLET | Freq: Every day | ORAL | Status: DC
  Administered 2020-11-30 – 2020-12-08 (×8): 60 mg via ORAL
  Filled 2020-11-29 (×9): qty 1

## 2020-11-29 MED ORDER — ENSURE MAX PROTEIN PO LIQD
11.0000 [oz_av] | Freq: Two times a day (BID) | ORAL | Status: DC
  Administered 2020-11-29 – 2020-12-08 (×14): 11 [oz_av] via ORAL
  Filled 2020-11-29 (×20): qty 330

## 2020-11-29 NOTE — Plan of Care (Signed)
?  Problem: Clinical Measurements: ?Goal: Respiratory complications will improve ?Outcome: Progressing ?  ?Problem: Clinical Measurements: ?Goal: Cardiovascular complication will be avoided ?Outcome: Progressing ?  ?Problem: Coping: ?Goal: Level of anxiety will decrease ?Outcome: Progressing ?  ?Problem: Elimination: ?Goal: Will not experience complications related to bowel motility ?Outcome: Progressing ?  ?

## 2020-11-29 NOTE — Progress Notes (Signed)
Patient seen and evaluated.  Ulceration noted of the right heel with drainage present.  He has diffuse warmth through the right ankle and heel with severe tenderness to palpation particularly in the lateral aspect of the calcaneus.  Palpable DP pulse.  Does have history of diabetes with recent A1c of 12.1 two days ago.  MRI of the right foot/ankle revealed acute osteomyelitis of the calcaneus among other findings.  Plan for further evaluation by Dr. Lajoyce Corners with full consult to follow at that time likely tomorrow.

## 2020-11-29 NOTE — Progress Notes (Signed)
Initial Nutrition Assessment  INTERVENTION:   -Ensure MAX Protein po BID, each supplement provides 150 kcal and 30 grams of protein   -Prosource Plus PO BID, each provides 100 kcals and 15g protein  -CHO counting handout in discharge instructions  NUTRITION DIAGNOSIS:   Increased nutrient needs related to wound healing as evidenced by estimated needs.  GOAL:   Patient will meet greater than or equal to 90% of their needs  MONITOR:   PO intake, Supplement acceptance, Diet advancement, Labs, Weight trends, I & O's, Skin  REASON FOR ASSESSMENT:   Consult Wound healing  ASSESSMENT:   75 year old male with past medical history of poorly controlled diabetes mellitus, hypertension and hyperlipidemia who developed issues with some right ankle and foot pain with infection a month ago with symptoms persisting despite doses of antibiotics, x-rays, DVT ruled out and an unsuccessful ankle arthrocentesis on 7/17.  Presented to the ED on the evening of 7/21 with persistent complaints and found to have sepsis.  MRI of right foot/ankle revealed osteomyelitis.  Patient was NPO this morning but now advanced to heart healthy. Was consuming 90% of meals when on CHO modified diet yesterday. Pt with good appetite. Will order protein supplements to aid in wound healing of rt foot osteomyelitis. Pt with history of diabetes, will add diet handout to discharge instructions for review.  Per weight records in care everywhere, pt with stable weights. Weighed 208 lbs in April 2021. Current weight: 210 lbs.  Medications reviewed.  Labs reviewed:  CBGs: 177-281 Low Na  NUTRITION - FOCUSED PHYSICAL EXAM:  Unable to complete  Diet Order:   Diet Order             Diet NPO time specified  Diet effective midnight                   EDUCATION NEEDS:   Not appropriate for education at this time  Skin:  Skin Assessment: Reviewed RN Assessment  Last BM:  7/21  Height:   Ht Readings from  Last 1 Encounters:  11/26/20 5\' 11"  (1.803 m)    Weight:   Wt Readings from Last 1 Encounters:  11/26/20 95.3 kg    BMI:  Body mass index is 29.29 kg/m.  Estimated Nutritional Needs:   Kcal:  2000-2200  Protein:  100-115g  Fluid:  2L/day  11/28/20, MS, RD, LDN Inpatient Clinical Dietitian Contact information available via Amion

## 2020-11-29 NOTE — Discharge Instructions (Signed)

## 2020-11-29 NOTE — Progress Notes (Signed)
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   Initial MRI noted some concerns for possible gas-forming soft tissue concerning for necrotizing fasciitis.  Discussed with Ortho and this is likely from recent attempted arthrocentesis done 5 days ago.   --Initially started empiric Vanc and Zosyn --Change Vanc>>Zyvox and Zosyn>>Cefepime given AKI --Ortho consulted, to see later today or tomorrow AM --We will continue on diet until surgical plan in place --Pain control PRN --Follow cultures  AKI -not POA.  Likely multifactorial with poor p.o. intake, medications. -- Change antibiotics as above, stop Vanco and Zosyn --Resume diet --Gentle IV hydration --Renal ultrasound pending --Hold losartan  Hyponatremia - Na declining a bit, possible duet o NPO status.  Repeat sodium level this afternoon after on fluids several hours.    Hyperlipidemia - on statin.   Uncontrolled diabetes mellitus -  --sliding scale Novolog --A1c pending   Hypertension: Elevated BP's may be due to pain --resumed on home meds.   --Now holding losartan given AKI --Increased home nifedipine --As needed oral hydralazine   Overweight: Body mass index is 29.29 kg/m.   DVT prophylaxis: Place and maintain sequential compression device Start: 11/27/20 1919   Diet:  Diet Orders (From admission, onward)     Start     Ordered   11/29/20 1007  Diet Carb Modified Fluid consistency: Thin; Room service appropriate? Yes  Diet effective now       Comments: Heart healthy as well  please  Question Answer Comment  Diet-HS Snack? Nothing   Calorie Level Medium 1600-2000   Fluid consistency: Thin   Room service appropriate? Yes      11/29/20 1007              Code Status: Full Code   Brief Narrative / Hospital Course to Date:   75 year old male with past medical history of poorly controlled diabetes mellitus, hypertension and hyperlipidemia who developed issues with some right ankle and foot pain with infection a month ago with symptoms persisting despite doses of antibiotics, x-rays, DVT ruled out and an unsuccessful ankle arthrocentesis on 7/17.  Presented to the ED on the evening of 7/21 with persistent complaints and found to have sepsis.  MRI of right foot/ankle revealed osteomyelitis.  Patient started on IV antibiotics and admitted to the hospitalist service on the morning of 7/22. Orthopedic surgery consulted.  Recent ABI's show good arterial flow in distal lower extremities.   Subjective 11/29/20    Pt awake resting in bed when seen today.  He was made n.p.o. after midnight again and is hungry this morning.  We started a diet since no confirmation of a plan for surgery at this point.  He continues to have severe right foot pain and says he is "living with it".  He denies any other acute complaints at this time.   Disposition Plan & Communication   Status is: Inpatient  Remains inpatient appropriate because:IV treatments appropriate due to intensity of illness or inability to take PO  Dispo: The patient is from: Home  Anticipated d/c is to: Home              Patient currently is not medically stable to d/c.   Difficult to place patient No   Family Communication: niece at bedside on rounds 7/23   Consults, Procedures, Significant Events   Consultants:  Orthopedic surgery  Procedures:  none  Antimicrobials:  Anti-infectives (From admission, onward)    Start     Dose/Rate Route Frequency Ordered Stop   11/29/20 1200  ceFEPIme  (MAXIPIME) 2 g in sodium chloride 0.9 % 100 mL IVPB        2 g 200 mL/hr over 30 Minutes Intravenous Every 12 hours 11/29/20 0823     11/29/20 1000  linezolid (ZYVOX) IVPB 600 mg        600 mg 300 mL/hr over 60 Minutes Intravenous Every 12 hours 11/29/20 0823     11/27/20 2100  vancomycin (VANCOCIN) IVPB 1000 mg/200 mL premix  Status:  Discontinued        1,000 mg 200 mL/hr over 60 Minutes Intravenous Every 12 hours 11/27/20 1947 11/29/20 0821   11/27/20 2100  piperacillin-tazobactam (ZOSYN) IVPB 3.375 g  Status:  Discontinued        3.375 g 12.5 mL/hr over 240 Minutes Intravenous Every 8 hours 11/27/20 1947 11/29/20 0821   11/27/20 0800  cefTRIAXone (ROCEPHIN) 2 g in sodium chloride 0.9 % 100 mL IVPB  Status:  Discontinued       See Hyperspace for full Linked Orders Report.   2 g 200 mL/hr over 30 Minutes Intravenous Every 24 hours 11/27/20 0058 11/27/20 1917   11/27/20 0200  metroNIDAZOLE (FLAGYL) tablet 500 mg  Status:  Discontinued       See Hyperspace for full Linked Orders Report.   500 mg Oral Every 8 hours 11/27/20 0058 11/27/20 1917   11/26/20 2315  ceFEPIme (MAXIPIME) 2 g in sodium chloride 0.9 % 100 mL IVPB        2 g 200 mL/hr over 30 Minutes Intravenous  Once 11/26/20 2314 11/26/20 2352   11/26/20 1900  vancomycin (VANCOCIN) IVPB 1000 mg/200 mL premix        1,000 mg 200 mL/hr over 60 Minutes Intravenous  Once 11/26/20 1845 11/26/20 2006         Micro    Objective   Vitals:   11/29/20 0127 11/29/20 0448 11/29/20 1000 11/29/20 1355  BP: (!) 133/91 131/69 (!) 147/86 123/81  Pulse: 100 61 86 (!) 105  Resp: 16 16 18 18   Temp: 97.6 F (36.4 C) 98.6 F (37 C)  98.3 F (36.8 C)  TempSrc: Oral Oral  Oral  SpO2: 99% 99% 99% 99%  Weight:      Height:        Intake/Output Summary (Last 24 hours) at 11/29/2020 1516 Last data filed at 11/29/2020 1301 Gross per 24 hour  Intake 1615.62 ml  Output 1400 ml  Net 215.62 ml   Filed Weights   11/26/20 1825  Weight:  95.3 kg    Physical Exam:  General exam: resting in bed, no acute distress Respiratory system: CTAB, no wheezes, rales or rhonchi, normal respiratory effort. Cardiovascular system: normal S1/S2, RRR Gastrointestinal system: soft, non-tender Central nervous system: grossly non-focal exam, normal speech Extremities: Right foot swelling with severe tenderness on palpation Skin: dry, intact, normal temperature   Labs   Data Reviewed: I have personally reviewed following labs and imaging studies  CBC: Recent Labs  Lab 11/22/20 1657 11/26/20 1839  PROGRESS NOTE    Cory [name]   IOM:[id number]  DOB: [birth year]  PCP: Sherald Hess., MD    DOA: 11/26/2020 LOS: 3   Assessment & Plan   Principal Problem:   Sepsis ([place]) Active Problems:   Hyperlipidemia   Uncontrolled diabetes mellitus ([place])   Hypertension   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   Initial MRI noted some concerns for possible gas-forming soft tissue concerning for necrotizing fasciitis.  Discussed with Ortho and this is likely from recent attempted arthrocentesis done 5 days ago.   --Initially started empiric Vanc and Zosyn --Change Vanc>>Zyvox and Zosyn>>Cefepime given AKI --Ortho consulted, to see later today or tomorrow AM --We will continue on diet until surgical plan in place --Pain control PRN --Follow cultures  AKI -not POA.  Likely multifactorial with poor p.o. intake, medications. -- Change antibiotics as above, stop Vanco and Zosyn --Resume diet --Gentle IV hydration --Renal ultrasound pending --Hold losartan  Hyponatremia - Na declining a bit, possible duet o NPO status.  Repeat sodium level this afternoon after on fluids several hours.    Hyperlipidemia - on statin.   Uncontrolled diabetes mellitus -  --sliding scale Novolog --A1c pending   Hypertension: Elevated BP's may be due to pain --resumed on home meds.   --Now holding losartan given AKI --Increased home nifedipine --As needed oral hydralazine   Overweight: Body mass index is 29.29 kg/m.   DVT prophylaxis: Place and maintain sequential compression device Start: 11/27/20 1919   Diet:  Diet Orders (From admission, onward)     Start     Ordered   11/29/20 1007  Diet Carb Modified Fluid consistency: Thin; Room service appropriate? Yes  Diet effective now       Comments: Heart healthy as well  please  Question Answer Comment  Diet-HS Snack? Nothing   Calorie Level Medium 1600-2000   Fluid consistency: Thin   Room service appropriate? Yes      11/29/20 1007              Code Status: Full Code   Brief Narrative / Hospital Course to Date:   75 year old male with past medical history of poorly controlled diabetes mellitus, hypertension and hyperlipidemia who developed issues with some right ankle and foot pain with infection a month ago with symptoms persisting despite doses of antibiotics, x-rays, DVT ruled out and an unsuccessful ankle arthrocentesis on 7/17.  Presented to the ED on the evening of 7/21 with persistent complaints and found to have sepsis.  MRI of right foot/ankle revealed osteomyelitis.  Patient started on IV antibiotics and admitted to the hospitalist service on the morning of 7/22. Orthopedic surgery consulted.  Recent ABI's show good arterial flow in distal lower extremities.   Subjective 11/29/20    Pt awake resting in bed when seen today.  He was made n.p.o. after midnight again and is hungry this morning.  We started a diet since no confirmation of a plan for surgery at this point.  He continues to have severe right foot pain and says he is "living with it".  He denies any other acute complaints at this time.   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   Initial MRI noted some concerns for possible gas-forming soft tissue concerning for necrotizing fasciitis.  Discussed with Ortho and this is likely from recent attempted arthrocentesis done 5 days ago.   --Initially started empiric Vanc and Zosyn --Change Vanc>>Zyvox and Zosyn>>Cefepime given AKI --Ortho consulted, to see later today or tomorrow AM --We will continue on diet until surgical plan in place --Pain control PRN --Follow cultures  AKI -not POA.  Likely multifactorial with poor p.o. intake, medications. -- Change antibiotics as above, stop Vanco and Zosyn --Resume diet --Gentle IV hydration --Renal ultrasound pending --Hold losartan  Hyponatremia - Na declining a bit, possible duet o NPO status.  Repeat sodium level this afternoon after on fluids several hours.    Hyperlipidemia - on statin.   Uncontrolled diabetes mellitus -  --sliding scale Novolog --A1c pending   Hypertension: Elevated BP's may be due to pain --resumed on home meds.   --Now holding losartan given AKI --Increased home nifedipine --As needed oral hydralazine   Overweight: Body mass index is 29.29 kg/m.   DVT prophylaxis: Place and maintain sequential compression device Start: 11/27/20 1919   Diet:  Diet Orders (From admission, onward)     Start     Ordered   11/29/20 1007  Diet Carb Modified Fluid consistency: Thin; Room service appropriate? Yes  Diet effective now       Comments: Heart healthy as well  please  Question Answer Comment  Diet-HS Snack? Nothing   Calorie Level Medium 1600-2000   Fluid consistency: Thin   Room service appropriate? Yes      11/29/20 1007              Code Status: Full Code   Brief Narrative / Hospital Course to Date:   75 year old male with past medical history of poorly controlled diabetes mellitus, hypertension and hyperlipidemia who developed issues with some right ankle and foot pain with infection a month ago with symptoms persisting despite doses of antibiotics, x-rays, DVT ruled out and an unsuccessful ankle arthrocentesis on 7/17.  Presented to the ED on the evening of 7/21 with persistent complaints and found to have sepsis.  MRI of right foot/ankle revealed osteomyelitis.  Patient started on IV antibiotics and admitted to the hospitalist service on the morning of 7/22. Orthopedic surgery consulted.  Recent ABI's show good arterial flow in distal lower extremities.   Subjective 11/29/20    Pt awake resting in bed when seen today.  He was made n.p.o. after midnight again and is hungry this morning.  We started a diet since no confirmation of a plan for surgery at this point.  He continues to have severe right foot pain and says he is "living with it".  He denies any other acute complaints at this time.   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   Initial MRI noted some concerns for possible gas-forming soft tissue concerning for necrotizing fasciitis.  Discussed with Ortho and this is likely from recent attempted arthrocentesis done 5 days ago.   --Initially started empiric Vanc and Zosyn --Change Vanc>>Zyvox and Zosyn>>Cefepime given AKI --Ortho consulted, to see later today or tomorrow AM --We will continue on diet until surgical plan in place --Pain control PRN --Follow cultures  AKI -not POA.  Likely multifactorial with poor p.o. intake, medications. -- Change antibiotics as above, stop Vanco and Zosyn --Resume diet --Gentle IV hydration --Renal ultrasound pending --Hold losartan  Hyponatremia - Na declining a bit, possible duet o NPO status.  Repeat sodium level this afternoon after on fluids several hours.    Hyperlipidemia - on statin.   Uncontrolled diabetes mellitus -  --sliding scale Novolog --A1c pending   Hypertension: Elevated BP's may be due to pain --resumed on home meds.   --Now holding losartan given AKI --Increased home nifedipine --As needed oral hydralazine   Overweight: Body mass index is 29.29 kg/m.   DVT prophylaxis: Place and maintain sequential compression device Start: 11/27/20 1919   Diet:  Diet Orders (From admission, onward)     Start     Ordered   11/29/20 1007  Diet Carb Modified Fluid consistency: Thin; Room service appropriate? Yes  Diet effective now       Comments: Heart healthy as well  please  Question Answer Comment  Diet-HS Snack? Nothing   Calorie Level Medium 1600-2000   Fluid consistency: Thin   Room service appropriate? Yes      11/29/20 1007              Code Status: Full Code   Brief Narrative / Hospital Course to Date:   75 year old male with past medical history of poorly controlled diabetes mellitus, hypertension and hyperlipidemia who developed issues with some right ankle and foot pain with infection a month ago with symptoms persisting despite doses of antibiotics, x-rays, DVT ruled out and an unsuccessful ankle arthrocentesis on 7/17.  Presented to the ED on the evening of 7/21 with persistent complaints and found to have sepsis.  MRI of right foot/ankle revealed osteomyelitis.  Patient started on IV antibiotics and admitted to the hospitalist service on the morning of 7/22. Orthopedic surgery consulted.  Recent ABI's show good arterial flow in distal lower extremities.   Subjective 11/29/20    Pt awake resting in bed when seen today.  He was made n.p.o. after midnight again and is hungry this morning.  We started a diet since no confirmation of a plan for surgery at this point.  He continues to have severe right foot pain and says he is "living with it".  He denies any other acute complaints at this time.   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   Initial MRI noted some concerns for possible gas-forming soft tissue concerning for necrotizing fasciitis.  Discussed with Ortho and this is likely from recent attempted arthrocentesis done 5 days ago.   --Initially started empiric Vanc and Zosyn --Change Vanc>>Zyvox and Zosyn>>Cefepime given AKI --Ortho consulted, to see later today or tomorrow AM --We will continue on diet until surgical plan in place --Pain control PRN --Follow cultures  AKI -not POA.  Likely multifactorial with poor p.o. intake, medications. -- Change antibiotics as above, stop Vanco and Zosyn --Resume diet --Gentle IV hydration --Renal ultrasound pending --Hold losartan  Hyponatremia - Na declining a bit, possible duet o NPO status.  Repeat sodium level this afternoon after on fluids several hours.    Hyperlipidemia - on statin.   Uncontrolled diabetes mellitus -  --sliding scale Novolog --A1c pending   Hypertension: Elevated BP's may be due to pain --resumed on home meds.   --Now holding losartan given AKI --Increased home nifedipine --As needed oral hydralazine   Overweight: Body mass index is 29.29 kg/m.   DVT prophylaxis: Place and maintain sequential compression device Start: 11/27/20 1919   Diet:  Diet Orders (From admission, onward)     Start     Ordered   11/29/20 1007  Diet Carb Modified Fluid consistency: Thin; Room service appropriate? Yes  Diet effective now       Comments: Heart healthy as well  please  Question Answer Comment  Diet-HS Snack? Nothing   Calorie Level Medium 1600-2000   Fluid consistency: Thin   Room service appropriate? Yes      11/29/20 1007              Code Status: Full Code   Brief Narrative / Hospital Course to Date:   75 year old male with past medical history of poorly controlled diabetes mellitus, hypertension and hyperlipidemia who developed issues with some right ankle and foot pain with infection a month ago with symptoms persisting despite doses of antibiotics, x-rays, DVT ruled out and an unsuccessful ankle arthrocentesis on 7/17.  Presented to the ED on the evening of 7/21 with persistent complaints and found to have sepsis.  MRI of right foot/ankle revealed osteomyelitis.  Patient started on IV antibiotics and admitted to the hospitalist service on the morning of 7/22. Orthopedic surgery consulted.  Recent ABI's show good arterial flow in distal lower extremities.   Subjective 11/29/20    Pt awake resting in bed when seen today.  He was made n.p.o. after midnight again and is hungry this morning.  We started a diet since no confirmation of a plan for surgery at this point.  He continues to have severe right foot pain and says he is "living with it".  He denies any other acute complaints at this time.   Disposition Plan & Communication   Status is: Inpatient  Remains inpatient appropriate because:IV treatments appropriate due to intensity of illness or inability to take PO  Dispo: The patient is from: Home

## 2020-11-30 DIAGNOSIS — I10 Essential (primary) hypertension: Secondary | ICD-10-CM

## 2020-11-30 DIAGNOSIS — E440 Moderate protein-calorie malnutrition: Secondary | ICD-10-CM

## 2020-11-30 DIAGNOSIS — E1165 Type 2 diabetes mellitus with hyperglycemia: Secondary | ICD-10-CM | POA: Diagnosis not present

## 2020-11-30 DIAGNOSIS — A419 Sepsis, unspecified organism: Secondary | ICD-10-CM | POA: Diagnosis not present

## 2020-11-30 DIAGNOSIS — E785 Hyperlipidemia, unspecified: Secondary | ICD-10-CM

## 2020-11-30 DIAGNOSIS — M86271 Subacute osteomyelitis, right ankle and foot: Secondary | ICD-10-CM

## 2020-11-30 LAB — GLUCOSE, CAPILLARY
Glucose-Capillary: 166 mg/dL — ABNORMAL HIGH (ref 70–99)
Glucose-Capillary: 194 mg/dL — ABNORMAL HIGH (ref 70–99)
Glucose-Capillary: 215 mg/dL — ABNORMAL HIGH (ref 70–99)
Glucose-Capillary: 220 mg/dL — ABNORMAL HIGH (ref 70–99)
Glucose-Capillary: 269 mg/dL — ABNORMAL HIGH (ref 70–99)
Glucose-Capillary: 316 mg/dL — ABNORMAL HIGH (ref 70–99)

## 2020-11-30 LAB — BASIC METABOLIC PANEL
Anion gap: 9 (ref 5–15)
BUN: 21 mg/dL (ref 8–23)
CO2: 27 mmol/L (ref 22–32)
Calcium: 8.4 mg/dL — ABNORMAL LOW (ref 8.9–10.3)
Chloride: 97 mmol/L — ABNORMAL LOW (ref 98–111)
Creatinine, Ser: 1.36 mg/dL — ABNORMAL HIGH (ref 0.61–1.24)
GFR, Estimated: 55 mL/min — ABNORMAL LOW (ref >60)
Glucose, Bld: 240 mg/dL — ABNORMAL HIGH (ref 70–99)
Potassium: 4 mmol/L (ref 3.5–5.1)
Sodium: 133 mmol/L — ABNORMAL LOW (ref 135–145)

## 2020-11-30 MED ORDER — MUPIROCIN 2 % EX OINT
1.0000 "application " | TOPICAL_OINTMENT | Freq: Two times a day (BID) | CUTANEOUS | Status: AC
  Administered 2020-11-30 – 2020-12-04 (×9): 1 via NASAL
  Filled 2020-11-30: qty 22

## 2020-11-30 MED ORDER — CHLORHEXIDINE GLUCONATE CLOTH 2 % EX PADS
6.0000 | MEDICATED_PAD | Freq: Every day | CUTANEOUS | Status: AC
  Administered 2020-11-30 – 2020-12-04 (×4): 6 via TOPICAL

## 2020-11-30 NOTE — Care Management Important Message (Signed)
Important Message  Patient Details IM Letter given to the Patient. Name: Cory [name] MRN: [id number] Date of Birth: 1946/02/28   Medicare Important Message Given:  Yes     Caren Macadam 11/30/2020, 1:21 PM

## 2020-11-30 NOTE — Consult Note (Signed)
ORTHOPAEDIC CONSULTATION  REQUESTING PHYSICIAN: [place] Eric J, DO  Chief Complaint: Painful ulcer right heel x4 weeks.  HPI: Cory [name] is a 75 y.o. male who presents with uncontrolled type 2 diabetes with a 4-week history of ulceration and pain right calcaneus.  Patient also has a history of high cholesterol and hypertension.  Past Medical History:  Diagnosis Date   Diabetes mellitus without complication ([place])    Hyperlipidemia    Hypertension    Past Surgical History:  Procedure Laterality Date   APPENDECTOMY     Social History   Socioeconomic History   Marital status: Married    Spouse name: Not on file   Number of children: Not on file   Years of education: Not on file   Highest education level: Not on file  Occupational History   Not on file  Tobacco Use   Smoking status: Never   Smokeless tobacco: Never  Substance and Sexual Activity   Alcohol use: Never   Drug use: Never   Sexual activity: Not on file  Other Topics Concern   Not on file  Social History Narrative   Not on file   Social Determinants of Health   Financial Resource Strain: Not on file  Food Insecurity: Not on file  Transportation Needs: Not on file  Physical Activity: Not on file  Stress: Not on file  Social Connections: Not on file   Family History  Problem Relation Age of Onset   Diabetes Mother    Diabetes Father    - negative except otherwise stated in the family history section No Known Allergies Prior to Admission medications   Medication Sig Start Date End Date Taking? Authorizing Provider  atorvastatin (LIPITOR) 20 MG tablet Take 20 mg by mouth at bedtime. 09/14/20  Yes [provider]  Dulaglutide (TRULICITY) 5.57 DU/2.0UR SOPN 1 (one) Solution Pen-injector under skin once a week Patient taking differently: Inject 0.75 mg into the skin every Monday. 11/02/20  Yes   LANTUS SOLOSTAR 100 UNIT/ML Solostar Pen Inject 45 Units into the skin daily. 11/11/20  Yes  [provider]  losartan-hydrochlorothiazide (HYZAAR) 100-25 MG tablet Take 1 tablet by mouth daily. 09/14/20  Yes [provider]  metFORMIN (GLUCOPHAGE) 1000 MG tablet Take 1,000 mg by mouth 2 (two) times daily. 06/15/20  Yes [provider]  metoprolol succinate (TOPROL-XL) 100 MG 24 hr tablet Take 100 mg by mouth daily. 11/09/20  Yes [provider]  NIFEdipine (PROCARDIA-XL/NIFEDICAL-XL) 30 MG 24 hr tablet Take 30 mg by mouth daily. 09/16/20  Yes [provider]  tamsulosin (FLOMAX) 0.4 MG CAPS capsule 1 Capsule by mouth daily Patient taking differently: Take 0.4 mg by mouth daily. 11/02/20  Yes   Accu-Chek Softclix Lancets lancets USE AS directed to test blood sugar daily 11/02/20     Blood Glucose Monitoring Suppl (BLOOD GLUCOSE MONITOR SYSTEM) w/Device KIT 1 (one) Kit as directed 11/02/20     colchicine 0.6 MG tablet Take 1 tablet (0.6 mg total) by mouth 2 (two) times daily for 7 days. Patient not taking: Reported on 11/27/2020 11/18/20 11/25/20  Noemi Chapel, MD  dapagliflozin propanediol (FARXIGA) 10 MG TABS tablet Take 1 tablet by mouth every morning Patient not taking: Reported on 11/27/2020 11/02/20     glucose blood test strip use as directed to test blood sugar daily 11/02/20     HYDROcodone-acetaminophen (NORCO/VICODIN) 5-325 MG tablet Take 1 tablet by mouth every 6 (six) hours as needed for up to 10  doses for severe pain. Patient not taking: Reported on 11/27/2020 11/22/20   Levin Bacon, MD  Insulin Pen Needle (B-D UF III MINI PEN NEEDLES) 31G X 5 MM MISC 1 (one) Each daily 11/02/20     furosemide (LASIX) 20 MG tablet Take 1-2 tablets in the morning for your ankle and foot swelling. Patient not taking: No sig reported 11/12/20 11/27/20  Vanessa Kick, MD   US RENAL  Result Date: 11/29/2020 CLINICAL DATA:  Acute renal insufficiency EXAM: RENAL / URINARY TRACT ULTRASOUND COMPLETE COMPARISON:  None. FINDINGS: Right Kidney: Renal measurements:  10.7 x 5.7 x 6.5 cm = volume: 205 mL. Echogenicity within normal limits. No mass or hydronephrosis visualized. Left Kidney: Renal measurements: 12.0 x 6.4 x 6.8 cm = volume: 274 mL. Echogenicity within normal limits. No mass or hydronephrosis visualized. Bladder: Appears normal for degree of bladder distention. Other: None. IMPRESSION: The study is somewhat limited due to shadowing bowel gas. No abnormalities are identified to explain the patient's acute renal insufficiency. Electronically Signed   By: Dorise Bullion III M.D   On: 11/29/2020 11:48   - pertinent xrays, CT, MRI studies were reviewed and independently interpreted  Positive ROS: All other systems have been reviewed and were otherwise negative with the exception of those mentioned in the HPI and as above.  Physical Exam: General: Alert, no acute distress Psychiatric: Patient is competent for consent with normal mood and affect Lymphatic: No axillary or cervical lymphadenopathy Cardiovascular: No pedal edema Respiratory: No cyanosis, no use of accessory musculature GI: No organomegaly, abdomen is soft and non-tender    Images:  '@ENCIMAGES' @  Labs:  Lab Results  Component Value Date   HGBA1C 12.1 (H) 11/27/2020   ESRSEDRATE 81 (H) 11/26/2020   ESRSEDRATE 57 (H) 11/22/2020   CRP 19.6 (H) 11/26/2020   CRP 8.7 (H) 11/22/2020   LABURIC 3.2 (L) 11/22/2020   LABURIC 3.4 (L) 11/18/2020   REPTSTATUS PENDING 11/27/2020   GRAMSTAIN  11/27/2020    NO WBC SEEN NO ORGANISMS SEEN Performed at [facility], [place] [address], [place], [place] [address]    CULT  11/27/2020    MODERATE ESCHERICHIA COLI NO ANAEROBES ISOLATED; CULTURE IN PROGRESS FOR 5 DAYS    LABORGA ESCHERICHIA COLI 11/27/2020    Lab Results  Component Value Date   ALBUMIN 2.6 (L) 11/28/2020   ALBUMIN 3.0 (L) 11/26/2020   ALBUMIN 3.1 (L) 11/22/2020   LABURIC 3.2 (L) 11/22/2020   LABURIC 3.4 (L) 11/18/2020     CBC EXTENDED Latest Ref Rng & Units  11/29/2020 11/28/2020 11/27/2020  WBC 4.0 - 10.5 K/uL 11.3(H) 11.9(H) 12.7(H)  RBC 4.22 - 5.81 MIL/uL 4.35 4.51 4.70  HGB 13.0 - 17.0 g/dL 11.9(L) 12.2(L) 12.8(L)  HCT 39.0 - 52.0 % 35.6(L) 36.3(L) 38.4(L)  PLT 150 - 400 K/uL 230 226 241  NEUTROABS 1.7 - 7.7 K/uL - - -  LYMPHSABS 0.7 - 4.0 K/uL - - -    Neurologic: Patient does not have protective sensation bilateral lower extremities.   MUSCULOSKELETAL:   Skin: Examination patient has a blistering painful ulcer right calcaneus that is approximately 5 cm in diameter.  Patient has a strong dorsalis pedis pulse.  Review of the MRI scan shows osteomyelitis involving a large portion of the lateral aspect of the right calcaneus.  Patient's white blood cell count has been elevated between 11 and 12.  Patient's albumin is 2.6 and he has a hemoglobin A1c of 12.1 with a sed rate of 81  and a C-reactive protein of 19.6  Assessment: Assessment: Diabetic insensate neuropathy uncontrolled with moderate protein caloric malnutrition with osteomyelitis and ulceration right calcaneus.  Plan: Plan: Discussed with the patient we will need to proceed with a right transtibial amputation.  Patient will need to be transferred to [facility] for surgery on Wednesday.  Patient is reluctant to proceed with surgery but would like me to call his niece to review the findings.  I will call the niece today.  Thank you for the consult and the opportunity to see Mr. Lian Tanori, [place] [phone] 8:05 AM

## 2020-11-30 NOTE — Progress Notes (Signed)
Patient ID: Cory [name], male   DOB: 1945-06-26, 75 y.o.   MRN: [id number] I called the patient's niece and reviewed the clinical and MRI scan findings and the recommendation to proceed with a below-knee amputation.  All questions were answered discussed that proposed surgery would be Wednesday around 930.  Discussed placement at skilled nursing postoperatively.  Patient's niece will call me if she has further questions.

## 2020-11-30 NOTE — Progress Notes (Signed)
PROGRESS NOTE    Baker Shifrin  ZOX:[id number] DOB: Feb 14, 1946 DOA: 11/26/2020 PCP: Frederich Chick., MD    Brief Narrative:  Cory [name] is a 75 year old male with past medical history significant for type 2 diabetes mellitus, essential hypertension, hyperlipidemia who presented to [facility] ED on 7/21 with progressive ankle/foot pain.  Onset roughly 1 month ago with multiple ED visits with outpatient courses of Keflex and Bactrim.  Now with difficulty bearing weight on right foot.  MRI notable for right foot osteomyelitis.  Recent ABIs showed good arterial flow in the distal lower extremities.  Patient was started on IV antibiotics and admitted to the hospitalist service for further evaluation and treatment.  Orthopedics was consulted and plan for operative management.   Assessment & Plan:   Principal Problem:   Sepsis (HCC) Active Problems:   Hyperlipidemia   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)   Hypertension   Osteomyelitis of right foot (HCC)   Overweight (BMI 25.0-29.9)   Moderate protein-calorie malnutrition (HCC)   Sepsis, POA Right calcaneal osteomyelitis with cellulitis Patient presenting with progressive right foot pain, difficulty ambulating despite outpatient treatment with oral antibiotics.  On arrival, patient with tachycardia, leukocytosis.  MR right ankle with and without contrast with soft tissue irregularity posterior lateral aspect ankle with extensive ill-defined fluid within soft tissues consistent with cellulitis; acute osteomyelitis involving the lateral aspect of the calcaneal body with diffuse intramuscular edema consistent with myositis. Recent ABIs showed good arterial flow in the distal lower extremities --Orthopedics following, Dr. Lajoyce Corners; appreciate assistance --CRP 19.6 --ESR 81 --Blood cultures x2 7/21: No growth x4 days --Cefepime 2 g IV every 12 hours --Zyvox 600 mg IV every 12 hours --Orthopedics plan surgical intervention on Wednesday,  11/30/2020  Acute renal failure, not POA Etiology likely multifactorial with poor oral intake, medications to include IV antibiotics. --Cr 0.76>>1.64 --IV antibiotics changed from vancomycin/Zosyn to Zyvox and cefepime --Holding home losartan --Continue gentle IV fluid hydration with NS at 75 mL/h --Repeat BMP in a.m.  Hyponatremia Sodium down to 128, suspect volume depletion with poor oral intake. --Continue IV fluid hydration as above --Repeat BMP in the a.m.  Type 2 diabetes mellitus, with hyperglycemia Hemoglobin A1c 12.1, poorly controlled.  Patient reports inconsistencies with home medication regimen.  On Trulicity, metformin, Lantus 45 units subcutaneously daily at home.  Suspect dietary indiscretions. --Lantus 30 units subcutaneously qHS --SSI for further coverage --CBG before every meal/at bedtime --Diabetic educator consult  Essential hypertension --Metoprolol succinate 100 mg p.o. daily --Nifedipine 60 mg p.o. daily --Holding home losartan secondary to AKI as above --Hydralazine as needed  Hyperlipidemia: Continue atorvastatin 20 mg p.o. daily  BPH: Tamsulosin 0.4 mg p.o. nightly    DVT prophylaxis: Place and maintain sequential compression device Start: 11/27/20 1919   Code Status: Full Code Family Communication: Updated patient's niece who is present at bedside this morning  Disposition Plan:  Level of care: Med-Surg Status is: Inpatient  Remains inpatient appropriate because:Ongoing diagnostic testing needed not appropriate for outpatient work up, IV treatments appropriate due to intensity of illness or inability to take PO, and Inpatient level of care appropriate due to severity of illness  Dispo: The patient is from: Home              Anticipated d/c is to: Home              Patient currently is not medically stable to d/c.   Difficult to place patient No   Consultants:  Orthopedics, Dr. Lajoyce Corners  Procedures:  None  Antimicrobials:  Vancomycin 7/21  - 7/24 Zosyn 7/22 - 7/24 Ceftriaxone 7/22 - 7/22 Flagyl 7/22 - 7/22 Zyvox 7/24>> Cefepime 7/21, 7/24>>   Subjective: Patient seen examined bedside, resting comfortably.  Niece present at bedside.  Seen by orthopedics this morning, Dr. Lajoyce Corners and plans for surgical intervention on Wednesday and requesting transfer to [facility].  Niece concerned about plans for amputation and awaiting to hear back from Dr. Lajoyce Corners regarding surgical plans.  Patient with no other complaints or concerns at this time.  Pain controlled.  Denies headache, no fever/chills/night sweats, no nausea/vomiting/diarrhea, no chest pain, no palpitations, no shortness of breath, no abdominal pain.  No acute concerns overnight per nursing staff.  Objective: Vitals:   11/29/20 1000 11/29/20 1355 11/29/20 2116 11/30/20 0437  BP: (!) 147/86 123/81 (!) 150/85 125/87  Pulse: 86 (!) 105 96 87  Resp: 18 18 18 18   Temp:  98.3 F (36.8 C) 99.6 F (37.6 C) 98.4 F (36.9 C)  TempSrc:  Oral Oral Oral  SpO2: 99% 99% 98% 100%  Weight:      Height:        Intake/Output Summary (Last 24 hours) at 11/30/2020 1116 Last data filed at 11/30/2020 1007 Gross per 24 hour  Intake 2565.73 ml  Output 2425 ml  Net 140.73 ml   Filed Weights   11/26/20 1825  Weight: 95.3 kg    Examination:  General exam: Appears calm and comfortable  Respiratory system: Clear to auscultation. Respiratory effort normal. Cardiovascular system: S1 & S2 heard, RRR. No JVD, murmurs, rubs, gallops or clicks. No pedal edema. Gastrointestinal system: Abdomen is nondistended, soft and nontender. No organomegaly or masses felt. Normal bowel sounds heard. Central nervous system: Alert and oriented. No focal neurological deficits. Extremities: Moves all extremities independently with intact muscle strength, right foot with edema and tenderness to palpation Skin: Right heel ulcer; otherwise no rashes, lesions or ulcers Psychiatry: Judgement and insight appear normal.  Mood & affect appropriate.     Data Reviewed: I have personally reviewed following labs and imaging studies  CBC: Recent Labs  Lab 11/26/20 1839 11/27/20 0456 11/27/20 1958 11/28/20 0432 11/29/20 0312  WBC 13.4* 12.3* 12.7* 11.9* 11.3*  NEUTROABS 11.7*  --   --   --   --   HGB 12.3* 11.8* 12.8* 12.2* 11.9*  HCT 36.1* 35.2* 38.4* 36.3* 35.6*  MCV 80.0 80.0 81.7 80.5 81.8  PLT 226 221 241 226 230   Basic Metabolic Panel: Recent Labs  Lab 11/26/20 1839 11/27/20 0456 11/28/20 0432 11/29/20 0312 11/29/20 1535  NA 128* 131* 129* 128* 129*  K 5.1 3.9 3.7 3.6  --   CL 93* 93* 93* 92*  --   CO2 22 24 25 28   --   GLUCOSE 462* 243* 150* 234*  --   BUN 18 12 12  26*  --   CREATININE 1.05 0.83 0.76 1.64*  --   CALCIUM 9.0 8.6* 8.3* 8.5*  --    GFR: Estimated Creatinine Clearance: 47.3 mL/min (A) (by C-G formula based on SCr of 1.64 mg/dL (H)). Liver Function Tests: Recent Labs  Lab 11/26/20 1839 11/28/20 0432  AST 31 75*  ALT 22 36  ALKPHOS 75 70  BILITOT 1.0 0.9  PROT 7.5 7.0  ALBUMIN 3.0* 2.6*   No results for input(s): LIPASE, AMYLASE in the last 168 hours. No results for input(s): AMMONIA in the last 168 hours. Coagulation Profile: Recent Labs  Lab  11/26/20 1839  INR 1.2   Cardiac Enzymes: No results for input(s): CKTOTAL, CKMB, CKMBINDEX, TROPONINI in the last 168 hours. BNP (last 3 results) No results for input(s): PROBNP in the last 8760 hours. HbA1C: No results for input(s): HGBA1C in the last 72 hours. CBG: Recent Labs  Lab 11/29/20 1946 11/29/20 2153 11/30/20 0059 11/30/20 0432 11/30/20 0739  GLUCAP 300* 235* 220* 194* 166*   Lipid Profile: No results for input(s): CHOL, HDL, LDLCALC, TRIG, CHOLHDL, LDLDIRECT in the last 72 hours. Thyroid Function Tests: Recent Labs    11/29/20 1535  TSH 1.125   Anemia Panel: No results for input(s): VITAMINB12, FOLATE, FERRITIN, TIBC, IRON, RETICCTPCT in the last 72 hours. Sepsis Labs: Recent Labs   Lab 11/26/20 1839 11/26/20 2000  LATICACIDVEN 1.7 1.2    Recent Results (from the past 240 hour(s))  Culture, blood (Routine x 2)     Status: None (Preliminary result)   Collection Time: 11/26/20  6:39 PM   Specimen: BLOOD LEFT HAND  Result Value Ref Range Status   Specimen Description   Final    BLOOD LEFT HAND Performed at [facility], [address] [place], [place] [address]    Special Requests   Final    BOTTLES DRAWN AEROBIC AND ANAEROBIC Blood Culture adequate volume Performed at [facility], [address] [place], [place] [address]    Culture   Final    NO GROWTH 4 DAYS Performed at [facility], [address], [place], [place] [address]    Report Status PENDING  Incomplete  Culture, blood (Routine x 2)     Status: None (Preliminary result)   Collection Time: 11/26/20  6:44 PM   Specimen: BLOOD  Result Value Ref Range Status   Specimen Description   Final    BLOOD BLOOD RIGHT HAND Performed at [facility], [address] [place], [place] [address]    Special Requests   Final    BOTTLES DRAWN AEROBIC AND ANAEROBIC Blood Culture adequate volume Performed at [facility], [address], [place], [place] [address]    Culture   Final    NO GROWTH 4 DAYS Performed at [facility], [address] [place], [place] [address]    Report Status PENDING  Incomplete  Resp Panel by RT-PCR (Flu A&B, Covid) Nasopharyngeal Swab     Status: None   Collection Time: 11/26/20  7:00 PM   Specimen: Nasopharyngeal Swab; Nasopharyngeal(NP) swabs in vial transport medium  Result Value Ref Range Status   SARS Coronavirus 2 by RT PCR NEGATIVE NEGATIVE Final    Comment: (NOTE) SARS-CoV-2 target nucleic acids are NOT DETECTED.  The SARS-CoV-2 RNA is generally detectable in upper respiratory specimens during the acute phase of infection. The lowest concentration of SARS-CoV-2 viral copies this assay can  detect is 138 copies/mL. A negative result does not preclude SARS-Cov-2 infection and should not be used as the sole basis for treatment or other patient management decisions. A negative result may occur with  improper specimen collection/handling, submission of specimen other than nasopharyngeal swab, presence of viral mutation(s) within the areas targeted by this assay, and inadequate number of viral copies(<138 copies/mL). A negative result must be combined with clinical observations, patient history, and epidemiological information. The expected result is Negative.  Fact Sheet for Patients:  BloggerCourse.com  Fact Sheet for Healthcare Providers:  SeriousBroker.it  This test is no t yet approved or cleared by  the [employer] and  has been authorized for detection and/or diagnosis of SARS-CoV-2 by FDA under an Emergency Use Authorization (EUA). This EUA will remain  in effect (meaning this test can be used) for the duration of the COVID-19 declaration under Section 564(b)(1) of the Act, 21 U.S.C.section 360bbb-3(b)(1), unless the authorization is terminated  or revoked sooner.       Influenza A by PCR NEGATIVE NEGATIVE Final   Influenza B by PCR NEGATIVE NEGATIVE Final    Comment: (NOTE) The Xpert Xpress SARS-CoV-2/FLU/RSV plus assay is intended as an aid in the diagnosis of influenza from Nasopharyngeal swab specimens and should not be used as a sole basis for treatment. Nasal washings and aspirates are unacceptable for Xpert Xpress SARS-CoV-2/FLU/RSV testing.  Fact Sheet for Patients: BloggerCourse.com  Fact Sheet for Healthcare Providers: SeriousBroker.it  This test is not yet approved or cleared by the [place] FDA and has been authorized for detection and/or diagnosis of SARS-CoV-2 by FDA under an Emergency Use Authorization (EUA). This EUA will remain in  effect (meaning this test can be used) for the duration of the COVID-19 declaration under Section 564(b)(1) of the Act, 21 U.S.C. section 360bbb-3(b)(1), unless the authorization is terminated or revoked.  Performed at [facility], [address] [place], [place] [address]   MRSA Next Gen by PCR, Nasal     Status: Abnormal   Collection Time: 11/27/20  2:08 AM   Specimen: Nasal Mucosa; Nasal Swab  Result Value Ref Range Status   MRSA by PCR Next Gen DETECTED (A) NOT DETECTED Final    Comment: RESULT CALLED TO, READ BACK BY AND VERIFIED WITH: DANIEL K. ON 11/27/2020 @ 0717 BY MECIAL J. (NOTE) The GeneXpert MRSA Assay (FDA approved for NASAL specimens only), is one component of a comprehensive MRSA colonization surveillance program. It is not intended to diagnose MRSA infection nor to guide or monitor treatment for MRSA infections. Test performance is not FDA approved in patients less than 4 years old. Performed at [facility], [address], [place], [place] [address]   Aerobic/Anaerobic Culture w Gram Stain (surgical/deep wound)     Status: None (Preliminary result)   Collection Time: 11/27/20  4:58 AM   Specimen: Wound  Result Value Ref Range Status   Specimen Description   Final    WOUND RT FOOT Performed at [facility], [address] [place], [place] [address]    Special Requests   Final    NONE Performed at [facility], [address], [place], [place] [address]    Gram Stain   Final    NO WBC SEEN NO ORGANISMS SEEN Performed at [facility], [address] [place], [place] [address]    Culture   Final    MODERATE ESCHERICHIA COLI NO ANAEROBES ISOLATED; CULTURE IN PROGRESS FOR 5 DAYS    Report Status PENDING  Incomplete   Organism ID, Bacteria ESCHERICHIA COLI  Final      Susceptibility   Escherichia coli - MIC*    AMPICILLIN >=32 RESISTANT Resistant     CEFAZOLIN <=4  SENSITIVE Sensitive     CEFEPIME <=0.12 SENSITIVE Sensitive     CEFTAZIDIME <=1 SENSITIVE Sensitive     CEFTRIAXONE <=0.25 SENSITIVE Sensitive     CIPROFLOXACIN 0.5 SENSITIVE Sensitive     GENTAMICIN <=1 SENSITIVE Sensitive     IMIPENEM <=0.25 SENSITIVE Sensitive     TRIMETH/SULFA <=20 SENSITIVE Sensitive  AMPICILLIN/SULBACTAM >=32 RESISTANT Resistant     PIP/TAZO <=4 SENSITIVE Sensitive     * MODERATE ESCHERICHIA COLI         Radiology Studies: US RENAL  Result Date: 11/29/2020 CLINICAL DATA:  Acute renal insufficiency EXAM: RENAL / URINARY TRACT ULTRASOUND COMPLETE COMPARISON:  None. FINDINGS: Right Kidney: Renal measurements: 10.7 x 5.7 x 6.5 cm = volume: 205 mL. Echogenicity within normal limits. No mass or hydronephrosis visualized. Left Kidney: Renal measurements: 12.0 x 6.4 x 6.8 cm = volume: 274 mL. Echogenicity within normal limits. No mass or hydronephrosis visualized. Bladder: Appears normal for degree of bladder distention. Other: None. IMPRESSION: The study is somewhat limited due to shadowing bowel gas. No abnormalities are identified to explain the patient's acute renal insufficiency. Electronically Signed   By: Gerome Sam III M.D   On: 11/29/2020 11:48        Scheduled Meds:  (feeding supplement) PROSource Plus  30 mL Oral BID WC   atorvastatin  20 mg Oral QHS   Chlorhexidine Gluconate Cloth  6 each Topical Q0600   insulin aspart  0-9 Units Subcutaneous Q4H   insulin glargine  30 Units Subcutaneous QHS   metoprolol succinate  100 mg Oral Daily   mupirocin ointment  1 application Nasal BID   NIFEdipine  60 mg Oral Daily   Ensure Max Protein  11 oz Oral BID   tamsulosin  0.4 mg Oral QHS   Continuous Infusions:  sodium chloride 75 mL/hr at 11/29/20 2211   ceFEPime (MAXIPIME) IV 2 g (11/30/20 0849)   linezolid (ZYVOX) IV 600 mg (11/30/20 0850)     LOS: 4 days    Time spent: 46 minutes spent on chart review, discussion with nursing staff, consultants,  updating family and interview/physical exam; more than 50% of that time was spent in counseling and/or coordination of care.    Alvira Philips [place], DO [facility] Available via Epic secure chat 7am-7pm After these hours, please refer to coverage provider listed on [email or web address] 11/30/2020, 11:16 AM

## 2020-12-01 DIAGNOSIS — E785 Hyperlipidemia, unspecified: Secondary | ICD-10-CM | POA: Diagnosis not present

## 2020-12-01 DIAGNOSIS — E1165 Type 2 diabetes mellitus with hyperglycemia: Secondary | ICD-10-CM | POA: Diagnosis not present

## 2020-12-01 DIAGNOSIS — A419 Sepsis, unspecified organism: Secondary | ICD-10-CM | POA: Diagnosis not present

## 2020-12-01 DIAGNOSIS — I10 Essential (primary) hypertension: Secondary | ICD-10-CM | POA: Diagnosis not present

## 2020-12-01 LAB — BASIC METABOLIC PANEL
Anion gap: 7 (ref 5–15)
BUN: 15 mg/dL (ref 8–23)
CO2: 25 mmol/L (ref 22–32)
Calcium: 8.3 mg/dL — ABNORMAL LOW (ref 8.9–10.3)
Chloride: 103 mmol/L (ref 98–111)
Creatinine, Ser: 1.24 mg/dL (ref 0.61–1.24)
GFR, Estimated: >60 mL/min (ref >60)
Glucose, Bld: 183 mg/dL — ABNORMAL HIGH (ref 70–99)
Potassium: 3.2 mmol/L — ABNORMAL LOW (ref 3.5–5.1)
Sodium: 135 mmol/L (ref 135–145)

## 2020-12-01 LAB — CBC
HCT: 33.7 % — ABNORMAL LOW (ref 39.0–52.0)
Hemoglobin: 11.3 g/dL — ABNORMAL LOW (ref 13.0–17.0)
MCH: 27.1 pg (ref 26.0–34.0)
MCHC: 33.5 g/dL (ref 30.0–36.0)
MCV: 80.8 fL (ref 80.0–100.0)
Platelets: 226 10*3/uL (ref 150–400)
RBC: 4.17 MIL/uL — ABNORMAL LOW (ref 4.22–5.81)
RDW: 12.4 % (ref 11.5–15.5)
WBC: 9.6 10*3/uL (ref 4.0–10.5)
nRBC: 0 % (ref 0.0–0.2)

## 2020-12-01 LAB — CULTURE, BLOOD (ROUTINE X 2)
Culture: NO GROWTH
Culture: NO GROWTH
Special Requests: ADEQUATE
Special Requests: ADEQUATE

## 2020-12-01 LAB — GLUCOSE, CAPILLARY
Glucose-Capillary: 111 mg/dL — ABNORMAL HIGH (ref 70–99)
Glucose-Capillary: 183 mg/dL — ABNORMAL HIGH (ref 70–99)
Glucose-Capillary: 208 mg/dL — ABNORMAL HIGH (ref 70–99)
Glucose-Capillary: 208 mg/dL — ABNORMAL HIGH (ref 70–99)
Glucose-Capillary: 213 mg/dL — ABNORMAL HIGH (ref 70–99)
Glucose-Capillary: 234 mg/dL — ABNORMAL HIGH (ref 70–99)
Glucose-Capillary: 235 mg/dL — ABNORMAL HIGH (ref 70–99)

## 2020-12-01 MED ORDER — SODIUM CHLORIDE 0.9 % IV SOLN
2.0000 g | Freq: Three times a day (TID) | INTRAVENOUS | Status: DC
  Administered 2020-12-01 – 2020-12-02 (×4): 2 g via INTRAVENOUS
  Filled 2020-12-01 (×4): qty 2

## 2020-12-01 MED ORDER — SODIUM CHLORIDE 0.9% FLUSH
10.0000 mL | INTRAVENOUS | Status: DC | PRN
  Administered 2020-12-02: 10 mL

## 2020-12-01 MED ORDER — POTASSIUM CHLORIDE CRYS ER 20 MEQ PO TBCR
40.0000 meq | EXTENDED_RELEASE_TABLET | ORAL | Status: AC
Start: 2020-12-01 — End: 2020-12-01
  Administered 2020-12-01 (×2): 40 meq via ORAL
  Filled 2020-12-01 (×2): qty 2

## 2020-12-01 MED ORDER — INSULIN GLARGINE 100 UNIT/ML ~~LOC~~ SOLN
35.0000 [IU] | Freq: Every day | SUBCUTANEOUS | Status: DC
  Administered 2020-12-01: 35 [IU] via SUBCUTANEOUS
  Filled 2020-12-01: qty 0.35

## 2020-12-01 NOTE — H&P (View-Only) (Signed)
Patient ID: Cory [name], male   DOB: 04-15-1946, 75 y.o.   MRN: [id number] I spoke with the patient and family member that was at bedside this evening.  Discussed his treatment options.  I have recommended that he pursue a orthopedic second opinion either this evening or tomorrow.  I discussed from my standpoint I do not feel that a surgical debridement of the osteomyelitis of the calcaneus with the large soft tissue envelope defect would be beneficial.  Patient also expressed interest in following up with the wound center.  I have recommended the orthopedic second opinion, I feel that surgical intervention is the best option for the patient's medical condition. I will postpone surgery until Friday to give the patient time to determine the optimal treatment for himself.  I discussed that I am available for him to call me at any time if he has further questions.

## 2020-12-01 NOTE — Plan of Care (Signed)
  Problem: Activity: Goal: Risk for activity intolerance will decrease Outcome: Progressing   Problem: Pain Managment: Goal: General experience of comfort will improve Outcome: Progressing   Problem: Safety: Goal: Ability to remain free from injury will improve Outcome: Progressing   

## 2020-12-01 NOTE — Progress Notes (Signed)
Inpatient Diabetes Program Recommendations  AACE/ADA: New Consensus Statement on Inpatient Glycemic Control (2015)  Target Ranges:  Prepandial:   less than 140 mg/dL      Peak postprandial:   less than 180 mg/dL (1-2 hours)      Critically ill patients:  140 - 180 mg/dL   Lab Results  Component Value Date   GLUCAP 208 (H) 12/01/2020   HGBA1C 12.1 (H) 11/27/2020    Review of Glycemic Control  Diabetes history: DM2 Outpatient Diabetes medications: Lantus 45 units QD, metformin 1000 mg BID, Trulicity 0.75 weekly (has only had 1 dose), Farxiga 10 mg QD (not taking) Current orders for Inpatient glycemic control: Lantus 35 units QHS, Novolog 0-9 units Q4H  HgbA1C - 12.1% Post-prandials elevated. Will be NPO after MN for surgery   Inpatient Diabetes Program Recommendations:    Consider meal coverage insulin for after surgery if eating well and also for discharge. Novolog 4 units TID with meals if eating > 50%.  Spoke with pt again at bedside. Discussed HgbA1C of 12.1% (average blood sugar of 301 mg/dL) and importance of keeping blood sugars in better control. Pt very vague about his home diabetes regimen. Reminded pt that monitoring his blood sugars is essential. Eating lunch at time of visit.  Continue to follow.  Will be d/ced likely to SNF/rehab.  Thank you. Ailene Ards, RD, LDN, CDE Inpatient Diabetes Coordinator [phone]

## 2020-12-01 NOTE — Progress Notes (Signed)
PHARMACY NOTE:  ANTIMICROBIAL RENAL DOSAGE ADJUSTMENT  Current antimicrobial regimen includes a mismatch between antimicrobial dosage and estimated renal function.  As per policy approved by the Pharmacy & Therapeutics and Medical Executive Committees, the antimicrobial dosage will be adjusted accordingly.  Current antimicrobial dosage:  Cefepime 2g IV q12h  Indication: Osteomyelitis, cellulitis  Renal Function: Estimated Creatinine Clearance: 62.5 mL/min (by C-G formula based on SCr of 1.24 mg/dL).    Antimicrobial dosage has been changed to:  Cefepime 2g IV q8h  Additional comments:   Thank you for allowing pharmacy to be a part of this patient's care.  Lynann Beaver PharmD, BCPS Clinical Pharmacist [facility] [phone] 12/01/2020 12:20 PM

## 2020-12-01 NOTE — Progress Notes (Signed)
Patient ID: Cory [name], male   DOB: 02/12/1946, 74 y.o.   MRN: [id number] I spoke with the patient and family member that was at bedside this evening.  Discussed his treatment options.  I have recommended that he pursue a orthopedic second opinion either this evening or tomorrow.  I discussed from my standpoint I do not feel that a surgical debridement of the osteomyelitis of the calcaneus with the large soft tissue envelope defect would be beneficial.  Patient also expressed interest in following up with the wound center.  I have recommended the orthopedic second opinion, I feel that surgical intervention is the best option for the patient's medical condition. I will postpone surgery until Friday to give the patient time to determine the optimal treatment for himself.  I discussed that I am available for him to call me at any time if he has further questions. 

## 2020-12-01 NOTE — Progress Notes (Signed)
PROGRESS NOTE    Cory [name]  MRN:[id number] DOB: 09/28/1945 DOA: 11/26/2020 PCP: Foster, Robert M Jr., MD    Brief Narrative:  Cory [name] is a 74-year-old male with past medical history significant for type 2 diabetes mellitus, essential hypertension, hyperlipidemia who presented to [facility] ED on 7/21 with progressive ankle/foot pain.  Onset roughly 1 month ago with multiple ED visits with outpatient courses of Keflex and Bactrim.  Now with difficulty bearing weight on right foot.  MRI notable for right foot osteomyelitis.  Recent ABIs showed good arterial flow in the distal lower extremities.  Patient was started on IV antibiotics and admitted to the hospitalist service for further evaluation and treatment.  Orthopedics was consulted and plan for operative management.   Assessment & Plan:   Principal Problem:   Sepsis (HCC) Active Problems:   Hyperlipidemia   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)   Hypertension   Osteomyelitis of right foot (HCC)   Overweight (BMI 25.0-29.9)   Moderate protein-calorie malnutrition (HCC)   Sepsis, POA Right calcaneal osteomyelitis with cellulitis Patient presenting with progressive right foot pain, difficulty ambulating despite outpatient treatment with oral antibiotics.  On arrival, patient with tachycardia, leukocytosis.  MR right ankle with and without contrast with soft tissue irregularity posterior lateral aspect ankle with extensive ill-defined fluid within soft tissues consistent with cellulitis; acute osteomyelitis involving the lateral aspect of the calcaneal body with diffuse intramuscular edema consistent with myositis. Recent ABIs showed good arterial flow in the distal lower extremities --Orthopedics following, Dr. Duda; appreciate assistance --CRP 19.6 --ESR 81 --Blood cultures x2 7/21: No growth x5 days --Cefepime 2 g IV every 12 hours --Zyvox 600 mg IV every 12 hours --Orthopedics plan surgical intervention on Wednesday,  11/30/2020; NPO after midnight --Pending transfer to [place]  Acute renal failure, not POA Etiology likely multifactorial with poor oral intake, medications to include IV antibiotics. --Cr 0.76>>1.64>1.24 --IV antibiotics changed from vancomycin/Zosyn to Zyvox and cefepime --Holding home losartan --Continue gentle IV fluid hydration with NS at 75 mL/h --Repeat BMP in a.m.  Hyponatremia Sodium down to 128, suspect volume depletion with poor oral intake. --Continue IV fluid hydration as above --Repeat BMP in the a.m.  Type 2 diabetes mellitus, with hyperglycemia Hemoglobin A1c 12.1, poorly controlled.  Patient reports inconsistencies with home medication regimen.  On Trulicity, metformin, Lantus 45 units subcutaneously daily at home.  Suspect dietary indiscretions. --Lantus 35 units subcutaneously qHS --SSI for further coverage --CBG before every meal/at bedtime --Diabetic educator consult  Essential hypertension --Metoprolol succinate 100 mg p.o. daily --Nifedipine 60 mg p.o. daily --Holding home losartan secondary to AKI as above --Hydralazine as needed  Hyperlipidemia: Continue atorvastatin 20 mg p.o. daily  BPH: Tamsulosin 0.4 mg p.o. nightly    DVT prophylaxis: Place and maintain sequential compression device Start: 11/27/20 1919   Code Status: Full Code Family Communication: No family present at bedside this morning, updated patient's niece present at bedside yesterday  Disposition Plan:  Level of care: Med-Surg Status is: Inpatient  Remains inpatient appropriate because:Ongoing diagnostic testing needed not appropriate for outpatient work up, IV treatments appropriate due to intensity of illness or inability to take PO, and Inpatient level of care appropriate due to severity of illness  Dispo: The patient is from: Home              Anticipated d/c is to: Home              Patient currently is not medically stable to   PROGRESS NOTE    Cory [name]  MRN:[id number] DOB: 03/04/1946 DOA: 11/26/2020 PCP: Foster, Robert M Jr., MD    Brief Narrative:  Cory [name] is a 74-year-old male with past medical history significant for type 2 diabetes mellitus, essential hypertension, hyperlipidemia who presented to [facility] ED on 7/21 with progressive ankle/foot pain.  Onset roughly 1 month ago with multiple ED visits with outpatient courses of Keflex and Bactrim.  Now with difficulty bearing weight on right foot.  MRI notable for right foot osteomyelitis.  Recent ABIs showed good arterial flow in the distal lower extremities.  Patient was started on IV antibiotics and admitted to the hospitalist service for further evaluation and treatment.  Orthopedics was consulted and plan for operative management.   Assessment & Plan:   Principal Problem:   Sepsis (HCC) Active Problems:   Hyperlipidemia   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)   Hypertension   Osteomyelitis of right foot (HCC)   Overweight (BMI 25.0-29.9)   Moderate protein-calorie malnutrition (HCC)   Sepsis, POA Right calcaneal osteomyelitis with cellulitis Patient presenting with progressive right foot pain, difficulty ambulating despite outpatient treatment with oral antibiotics.  On arrival, patient with tachycardia, leukocytosis.  MR right ankle with and without contrast with soft tissue irregularity posterior lateral aspect ankle with extensive ill-defined fluid within soft tissues consistent with cellulitis; acute osteomyelitis involving the lateral aspect of the calcaneal body with diffuse intramuscular edema consistent with myositis. Recent ABIs showed good arterial flow in the distal lower extremities --Orthopedics following, Dr. Duda; appreciate assistance --CRP 19.6 --ESR 81 --Blood cultures x2 7/21: No growth x5 days --Cefepime 2 g IV every 12 hours --Zyvox 600 mg IV every 12 hours --Orthopedics plan surgical intervention on Wednesday,  11/30/2020; NPO after midnight --Pending transfer to [place]  Acute renal failure, not POA Etiology likely multifactorial with poor oral intake, medications to include IV antibiotics. --Cr 0.76>>1.64>1.24 --IV antibiotics changed from vancomycin/Zosyn to Zyvox and cefepime --Holding home losartan --Continue gentle IV fluid hydration with NS at 75 mL/h --Repeat BMP in a.m.  Hyponatremia Sodium down to 128, suspect volume depletion with poor oral intake. --Continue IV fluid hydration as above --Repeat BMP in the a.m.  Type 2 diabetes mellitus, with hyperglycemia Hemoglobin A1c 12.1, poorly controlled.  Patient reports inconsistencies with home medication regimen.  On Trulicity, metformin, Lantus 45 units subcutaneously daily at home.  Suspect dietary indiscretions. --Lantus 35 units subcutaneously qHS --SSI for further coverage --CBG before every meal/at bedtime --Diabetic educator consult  Essential hypertension --Metoprolol succinate 100 mg p.o. daily --Nifedipine 60 mg p.o. daily --Holding home losartan secondary to AKI as above --Hydralazine as needed  Hyperlipidemia: Continue atorvastatin 20 mg p.o. daily  BPH: Tamsulosin 0.4 mg p.o. nightly    DVT prophylaxis: Place and maintain sequential compression device Start: 11/27/20 1919   Code Status: Full Code Family Communication: No family present at bedside this morning, updated patient's niece present at bedside yesterday  Disposition Plan:  Level of care: Med-Surg Status is: Inpatient  Remains inpatient appropriate because:Ongoing diagnostic testing needed not appropriate for outpatient work up, IV treatments appropriate due to intensity of illness or inability to take PO, and Inpatient level of care appropriate due to severity of illness  Dispo: The patient is from: Home              Anticipated d/c is to: Home              Patient currently is not medically stable to   PROGRESS NOTE    Cory [name]  MRN:[id number] DOB: 03/04/1946 DOA: 11/26/2020 PCP: Foster, Robert M Jr., MD    Brief Narrative:  Cory [name] is a 74-year-old male with past medical history significant for type 2 diabetes mellitus, essential hypertension, hyperlipidemia who presented to [facility] ED on 7/21 with progressive ankle/foot pain.  Onset roughly 1 month ago with multiple ED visits with outpatient courses of Keflex and Bactrim.  Now with difficulty bearing weight on right foot.  MRI notable for right foot osteomyelitis.  Recent ABIs showed good arterial flow in the distal lower extremities.  Patient was started on IV antibiotics and admitted to the hospitalist service for further evaluation and treatment.  Orthopedics was consulted and plan for operative management.   Assessment & Plan:   Principal Problem:   Sepsis (HCC) Active Problems:   Hyperlipidemia   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)   Hypertension   Osteomyelitis of right foot (HCC)   Overweight (BMI 25.0-29.9)   Moderate protein-calorie malnutrition (HCC)   Sepsis, POA Right calcaneal osteomyelitis with cellulitis Patient presenting with progressive right foot pain, difficulty ambulating despite outpatient treatment with oral antibiotics.  On arrival, patient with tachycardia, leukocytosis.  MR right ankle with and without contrast with soft tissue irregularity posterior lateral aspect ankle with extensive ill-defined fluid within soft tissues consistent with cellulitis; acute osteomyelitis involving the lateral aspect of the calcaneal body with diffuse intramuscular edema consistent with myositis. Recent ABIs showed good arterial flow in the distal lower extremities --Orthopedics following, Dr. Duda; appreciate assistance --CRP 19.6 --ESR 81 --Blood cultures x2 7/21: No growth x5 days --Cefepime 2 g IV every 12 hours --Zyvox 600 mg IV every 12 hours --Orthopedics plan surgical intervention on Wednesday,  11/30/2020; NPO after midnight --Pending transfer to [place]  Acute renal failure, not POA Etiology likely multifactorial with poor oral intake, medications to include IV antibiotics. --Cr 0.76>>1.64>1.24 --IV antibiotics changed from vancomycin/Zosyn to Zyvox and cefepime --Holding home losartan --Continue gentle IV fluid hydration with NS at 75 mL/h --Repeat BMP in a.m.  Hyponatremia Sodium down to 128, suspect volume depletion with poor oral intake. --Continue IV fluid hydration as above --Repeat BMP in the a.m.  Type 2 diabetes mellitus, with hyperglycemia Hemoglobin A1c 12.1, poorly controlled.  Patient reports inconsistencies with home medication regimen.  On Trulicity, metformin, Lantus 45 units subcutaneously daily at home.  Suspect dietary indiscretions. --Lantus 35 units subcutaneously qHS --SSI for further coverage --CBG before every meal/at bedtime --Diabetic educator consult  Essential hypertension --Metoprolol succinate 100 mg p.o. daily --Nifedipine 60 mg p.o. daily --Holding home losartan secondary to AKI as above --Hydralazine as needed  Hyperlipidemia: Continue atorvastatin 20 mg p.o. daily  BPH: Tamsulosin 0.4 mg p.o. nightly    DVT prophylaxis: Place and maintain sequential compression device Start: 11/27/20 1919   Code Status: Full Code Family Communication: No family present at bedside this morning, updated patient's niece present at bedside yesterday  Disposition Plan:  Level of care: Med-Surg Status is: Inpatient  Remains inpatient appropriate because:Ongoing diagnostic testing needed not appropriate for outpatient work up, IV treatments appropriate due to intensity of illness or inability to take PO, and Inpatient level of care appropriate due to severity of illness  Dispo: The patient is from: Home              Anticipated d/c is to: Home              Patient currently is not medically stable to   PROGRESS NOTE    Cory [name]  MRN:[id number] DOB: 09/28/1945 DOA: 11/26/2020 PCP: Foster, Robert M Jr., MD    Brief Narrative:  Cory [name] is a 74-year-old male with past medical history significant for type 2 diabetes mellitus, essential hypertension, hyperlipidemia who presented to [facility] ED on 7/21 with progressive ankle/foot pain.  Onset roughly 1 month ago with multiple ED visits with outpatient courses of Keflex and Bactrim.  Now with difficulty bearing weight on right foot.  MRI notable for right foot osteomyelitis.  Recent ABIs showed good arterial flow in the distal lower extremities.  Patient was started on IV antibiotics and admitted to the hospitalist service for further evaluation and treatment.  Orthopedics was consulted and plan for operative management.   Assessment & Plan:   Principal Problem:   Sepsis (HCC) Active Problems:   Hyperlipidemia   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)   Hypertension   Osteomyelitis of right foot (HCC)   Overweight (BMI 25.0-29.9)   Moderate protein-calorie malnutrition (HCC)   Sepsis, POA Right calcaneal osteomyelitis with cellulitis Patient presenting with progressive right foot pain, difficulty ambulating despite outpatient treatment with oral antibiotics.  On arrival, patient with tachycardia, leukocytosis.  MR right ankle with and without contrast with soft tissue irregularity posterior lateral aspect ankle with extensive ill-defined fluid within soft tissues consistent with cellulitis; acute osteomyelitis involving the lateral aspect of the calcaneal body with diffuse intramuscular edema consistent with myositis. Recent ABIs showed good arterial flow in the distal lower extremities --Orthopedics following, Dr. Duda; appreciate assistance --CRP 19.6 --ESR 81 --Blood cultures x2 7/21: No growth x5 days --Cefepime 2 g IV every 12 hours --Zyvox 600 mg IV every 12 hours --Orthopedics plan surgical intervention on Wednesday,  11/30/2020; NPO after midnight --Pending transfer to [place]  Acute renal failure, not POA Etiology likely multifactorial with poor oral intake, medications to include IV antibiotics. --Cr 0.76>>1.64>1.24 --IV antibiotics changed from vancomycin/Zosyn to Zyvox and cefepime --Holding home losartan --Continue gentle IV fluid hydration with NS at 75 mL/h --Repeat BMP in a.m.  Hyponatremia Sodium down to 128, suspect volume depletion with poor oral intake. --Continue IV fluid hydration as above --Repeat BMP in the a.m.  Type 2 diabetes mellitus, with hyperglycemia Hemoglobin A1c 12.1, poorly controlled.  Patient reports inconsistencies with home medication regimen.  On Trulicity, metformin, Lantus 45 units subcutaneously daily at home.  Suspect dietary indiscretions. --Lantus 35 units subcutaneously qHS --SSI for further coverage --CBG before every meal/at bedtime --Diabetic educator consult  Essential hypertension --Metoprolol succinate 100 mg p.o. daily --Nifedipine 60 mg p.o. daily --Holding home losartan secondary to AKI as above --Hydralazine as needed  Hyperlipidemia: Continue atorvastatin 20 mg p.o. daily  BPH: Tamsulosin 0.4 mg p.o. nightly    DVT prophylaxis: Place and maintain sequential compression device Start: 11/27/20 1919   Code Status: Full Code Family Communication: No family present at bedside this morning, updated patient's niece present at bedside yesterday  Disposition Plan:  Level of care: Med-Surg Status is: Inpatient  Remains inpatient appropriate because:Ongoing diagnostic testing needed not appropriate for outpatient work up, IV treatments appropriate due to intensity of illness or inability to take PO, and Inpatient level of care appropriate due to severity of illness  Dispo: The patient is from: Home              Anticipated d/c is to: Home              Patient currently is not medically stable to   PROGRESS NOTE    Cory [name]  MRN:[id number] DOB: 03/04/1946 DOA: 11/26/2020 PCP: Foster, Robert M Jr., MD    Brief Narrative:  Cory [name] is a 74-year-old male with past medical history significant for type 2 diabetes mellitus, essential hypertension, hyperlipidemia who presented to [facility] ED on 7/21 with progressive ankle/foot pain.  Onset roughly 1 month ago with multiple ED visits with outpatient courses of Keflex and Bactrim.  Now with difficulty bearing weight on right foot.  MRI notable for right foot osteomyelitis.  Recent ABIs showed good arterial flow in the distal lower extremities.  Patient was started on IV antibiotics and admitted to the hospitalist service for further evaluation and treatment.  Orthopedics was consulted and plan for operative management.   Assessment & Plan:   Principal Problem:   Sepsis (HCC) Active Problems:   Hyperlipidemia   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)   Hypertension   Osteomyelitis of right foot (HCC)   Overweight (BMI 25.0-29.9)   Moderate protein-calorie malnutrition (HCC)   Sepsis, POA Right calcaneal osteomyelitis with cellulitis Patient presenting with progressive right foot pain, difficulty ambulating despite outpatient treatment with oral antibiotics.  On arrival, patient with tachycardia, leukocytosis.  MR right ankle with and without contrast with soft tissue irregularity posterior lateral aspect ankle with extensive ill-defined fluid within soft tissues consistent with cellulitis; acute osteomyelitis involving the lateral aspect of the calcaneal body with diffuse intramuscular edema consistent with myositis. Recent ABIs showed good arterial flow in the distal lower extremities --Orthopedics following, Dr. Duda; appreciate assistance --CRP 19.6 --ESR 81 --Blood cultures x2 7/21: No growth x5 days --Cefepime 2 g IV every 12 hours --Zyvox 600 mg IV every 12 hours --Orthopedics plan surgical intervention on Wednesday,  11/30/2020; NPO after midnight --Pending transfer to [place]  Acute renal failure, not POA Etiology likely multifactorial with poor oral intake, medications to include IV antibiotics. --Cr 0.76>>1.64>1.24 --IV antibiotics changed from vancomycin/Zosyn to Zyvox and cefepime --Holding home losartan --Continue gentle IV fluid hydration with NS at 75 mL/h --Repeat BMP in a.m.  Hyponatremia Sodium down to 128, suspect volume depletion with poor oral intake. --Continue IV fluid hydration as above --Repeat BMP in the a.m.  Type 2 diabetes mellitus, with hyperglycemia Hemoglobin A1c 12.1, poorly controlled.  Patient reports inconsistencies with home medication regimen.  On Trulicity, metformin, Lantus 45 units subcutaneously daily at home.  Suspect dietary indiscretions. --Lantus 35 units subcutaneously qHS --SSI for further coverage --CBG before every meal/at bedtime --Diabetic educator consult  Essential hypertension --Metoprolol succinate 100 mg p.o. daily --Nifedipine 60 mg p.o. daily --Holding home losartan secondary to AKI as above --Hydralazine as needed  Hyperlipidemia: Continue atorvastatin 20 mg p.o. daily  BPH: Tamsulosin 0.4 mg p.o. nightly    DVT prophylaxis: Place and maintain sequential compression device Start: 11/27/20 1919   Code Status: Full Code Family Communication: No family present at bedside this morning, updated patient's niece present at bedside yesterday  Disposition Plan:  Level of care: Med-Surg Status is: Inpatient  Remains inpatient appropriate because:Ongoing diagnostic testing needed not appropriate for outpatient work up, IV treatments appropriate due to intensity of illness or inability to take PO, and Inpatient level of care appropriate due to severity of illness  Dispo: The patient is from: Home              Anticipated d/c is to: Home              Patient currently is not medically stable to

## 2020-12-02 DIAGNOSIS — E440 Moderate protein-calorie malnutrition: Secondary | ICD-10-CM | POA: Diagnosis not present

## 2020-12-02 DIAGNOSIS — A419 Sepsis, unspecified organism: Secondary | ICD-10-CM | POA: Diagnosis not present

## 2020-12-02 DIAGNOSIS — E785 Hyperlipidemia, unspecified: Secondary | ICD-10-CM | POA: Diagnosis not present

## 2020-12-02 DIAGNOSIS — E1165 Type 2 diabetes mellitus with hyperglycemia: Secondary | ICD-10-CM | POA: Diagnosis not present

## 2020-12-02 LAB — BASIC METABOLIC PANEL
Anion gap: 10 (ref 5–15)
BUN: 13 mg/dL (ref 8–23)
CO2: 26 mmol/L (ref 22–32)
Calcium: 8.3 mg/dL — ABNORMAL LOW (ref 8.9–10.3)
Chloride: 102 mmol/L (ref 98–111)
Creatinine, Ser: 0.95 mg/dL (ref 0.61–1.24)
GFR, Estimated: >60 mL/min (ref >60)
Glucose, Bld: 163 mg/dL — ABNORMAL HIGH (ref 70–99)
Potassium: 3.6 mmol/L (ref 3.5–5.1)
Sodium: 138 mmol/L (ref 135–145)

## 2020-12-02 LAB — GLUCOSE, CAPILLARY
Glucose-Capillary: 158 mg/dL — ABNORMAL HIGH (ref 70–99)
Glucose-Capillary: 122 mg/dL — ABNORMAL HIGH (ref 70–99)
Glucose-Capillary: 120 mg/dL — ABNORMAL HIGH (ref 70–99)
Glucose-Capillary: 179 mg/dL — ABNORMAL HIGH (ref 70–99)
Glucose-Capillary: 260 mg/dL — ABNORMAL HIGH (ref 70–99)

## 2020-12-02 LAB — MAGNESIUM: Magnesium: 2 mg/dL (ref 1.7–2.4)

## 2020-12-02 MED ORDER — INSULIN GLARGINE 100 UNIT/ML ~~LOC~~ SOLN
40.0000 [IU] | Freq: Every day | SUBCUTANEOUS | Status: DC
  Administered 2020-12-02: 40 [IU] via SUBCUTANEOUS
  Filled 2020-12-02: qty 0.4

## 2020-12-02 MED ORDER — OXYCODONE HCL 5 MG PO TABS
5.0000 mg | ORAL_TABLET | Freq: Four times a day (QID) | ORAL | Status: DC | PRN
  Administered 2020-12-04 – 2020-12-08 (×10): 5 mg via ORAL
  Filled 2020-12-02 (×12): qty 1

## 2020-12-02 MED ORDER — SODIUM CHLORIDE 0.9 % IV SOLN
2.0000 g | INTRAVENOUS | Status: DC
  Administered 2020-12-02 – 2020-12-04 (×3): 2 g via INTRAVENOUS
  Filled 2020-12-02 (×2): qty 2
  Filled 2020-12-02: qty 20

## 2020-12-02 NOTE — Consult Note (Signed)
ORTHOPAEDIC CONSULTATION  REQUESTING PHYSICIAN: [place] Eric J, DO  Chief Complaint: right heel ulceration - second opinion  HPI: Cory [name] is a 75 y.o. male with uncontrolled diabetes, HLD, and HTN presented to [facility] Emergency Department with a 4 week history of right heel pain. Symptoms have worsened over the past month. He has been seen in the ED and Urgent Care multiple times since the injury. Treated with antibiotics. He was admitted on 11/27/2020. MRI of the right foot showed osteomyelitis. He was started on IV antibiotics. Orthopedics was consulted. Dr. Sharol Given saw the patient and recommended below the knee amputation. Patient and his family wanted a second opinion.   Past Medical History:  Diagnosis Date   Diabetes mellitus without complication ([place])    Hyperlipidemia    Hypertension    Past Surgical History:  Procedure Laterality Date   APPENDECTOMY     Social History   Socioeconomic History   Marital status: Married    Spouse name: Not on file   Number of children: Not on file   Years of education: Not on file   Highest education level: Not on file  Occupational History   Not on file  Tobacco Use   Smoking status: Never   Smokeless tobacco: Never  Substance and Sexual Activity   Alcohol use: Never   Drug use: Never   Sexual activity: Not on file  Other Topics Concern   Not on file  Social History Narrative   Not on file   Social Determinants of Health   Financial Resource Strain: Not on file  Food Insecurity: Not on file  Transportation Needs: Not on file  Physical Activity: Not on file  Stress: Not on file  Social Connections: Not on file   Family History  Problem Relation Age of Onset   Diabetes Mother    Diabetes Father    No Known Allergies Prior to Admission medications   Medication Sig Start Date End Date Taking? Authorizing Provider  atorvastatin (LIPITOR) 20 MG tablet Take 20 mg by mouth at bedtime. 09/14/20  Yes [provider]  Dulaglutide (TRULICITY) 0.14 DC/3.0DT SOPN 1 (one) Solution Pen-injector under skin once a week Patient taking differently: Inject 0.75 mg into the skin every Monday. 11/02/20  Yes   LANTUS SOLOSTAR 100 UNIT/ML Solostar Pen Inject 45 Units into the skin daily. 11/11/20  Yes [provider]  losartan-hydrochlorothiazide (HYZAAR) 100-25 MG tablet Take 1 tablet by mouth daily. 09/14/20  Yes [provider]  metFORMIN (GLUCOPHAGE) 1000 MG tablet Take 1,000 mg by mouth 2 (two) times daily. 06/15/20  Yes [provider]  metoprolol succinate (TOPROL-XL) 100 MG 24 hr tablet Take 100 mg by mouth daily. 11/09/20  Yes [provider]  NIFEdipine (PROCARDIA-XL/NIFEDICAL-XL) 30 MG 24 hr tablet Take 30 mg by mouth daily. 09/16/20  Yes [provider]  tamsulosin (FLOMAX) 0.4 MG CAPS capsule 1 Capsule by mouth daily Patient taking differently: Take 0.4 mg by mouth daily. 11/02/20  Yes   Accu-Chek Softclix Lancets lancets USE AS directed to test blood sugar daily 11/02/20     Blood Glucose Monitoring Suppl (BLOOD GLUCOSE MONITOR SYSTEM) w/Device KIT 1 (one) Kit as directed 11/02/20     colchicine 0.6 MG tablet Take 1 tablet (0.6 mg total) by mouth 2 (two) times daily for 7 days. Patient not taking: Reported on 11/27/2020 11/18/20 11/25/20  Noemi Chapel, MD  dapagliflozin propanediol (FARXIGA) 10 MG TABS tablet Take 1 tablet by mouth  every morning Patient not taking: Reported on 11/27/2020 11/02/20     glucose blood test strip use as directed to test blood sugar daily 11/02/20     HYDROcodone-acetaminophen (NORCO/VICODIN) 5-325 MG tablet Take 1 tablet by mouth every 6 (six) hours as needed for up to 10 doses for severe pain. Patient not taking: Reported on 11/27/2020 11/22/20   Levin Bacon, MD  Insulin Pen Needle (B-D UF III MINI PEN NEEDLES) 31G X 5 MM MISC 1 (one) Each daily 11/02/20     furosemide (LASIX) 20 MG tablet Take 1-2 tablets in the morning for your  ankle and foot swelling. Patient not taking: No sig reported 11/12/20 11/27/20  Vanessa Kick, MD   No results found. Family History Reviewed and non-contributory, no pertinent history of problems with bleeding or anesthesia      Review of Systems 14 system ROS conducted and negative except for that noted in HPI   OBJECTIVE  Vitals:Patient Vitals for the past 8 hrs:  BP Temp Temp src Pulse Resp SpO2  12/02/20 0549 (!) 153/90 98.7 F (37.1 C) Oral 79 16 98 %   General: Alert, no acute distress Cardiovascular: Warm extremities noted Respiratory: No cyanosis, no use of accessory musculature GI: No organomegaly, abdomen is soft and non-tender Skin: No lesions in the area of chief complaint other than those listed below in MSK exam.  Neurologic: Sensation intact distally save for the below mentioned MSK exam Psychiatric: Patient is competent for consent with normal mood and affect Lymphatic: No swelling obvious and reported other than the area involved in the exam below  Extremities  Right lower extremity: ulceration of the right calcaneus. Tender to palpation right heel and ankle. +2 DP pulse.   Test Results Imaging MRI scan demonstrates osteomyelitis involving the lateral aspect of the right calcaneus.  Labs cbc Recent Labs    12/01/20 0329  WBC 9.6  HGB 11.3*  HCT 33.7*  PLT 226    Labs inflam No results for input(s): CRP in the last 72 hours.  Invalid input(s): ESR  Labs coag No results for input(s): INR, PTT in the last 72 hours.  Invalid input(s): PT  Recent Labs    12/01/20 0329 12/02/20 0346  NA 135 138  K 3.2* 3.6  CL 103 102  CO2 25 26  GLUCOSE 183* 163*  BUN 15 13  CREATININE 1.24 0.95  CALCIUM 8.3* 8.3*     ASSESSMENT AND PLAN: 75 y.o. 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 Sharol Given has seen the patient and recommended surgery. We were consulted for second opinion.    We agree with Dr. Sharol Given that a surgical debridement would not be sufficient for the patient nor would treatment from a wound care center. Due to the patient's uncontrollable diabetes and the severity of the wound, he is at risk for worsening infection and sepsis.   I discussed the situation with the patient as well as his niece.  I agree with Dr. Jess Barters assessment and endorsed that he is a subspecialist in this category of surgery and would be the best person to take care of the patient if they decide to go forward with that.  We will sign off and defer care to Dr. Sharol Given at the patient's discretion

## 2020-12-02 NOTE — Plan of Care (Signed)
  Problem: Education: Goal: Knowledge of General Education information will improve Description: Including pain rating scale, medication(s)/side effects and non-pharmacologic comfort measures Outcome: Progressing   Problem: Activity: Goal: Risk for activity intolerance will decrease Outcome: Progressing   Problem: Pain Managment: Goal: General experience of comfort will improve Outcome: Progressing   

## 2020-12-02 NOTE — Progress Notes (Signed)
PROGRESS NOTE    Cory [name]  ZOX:[id number] DOB: [birth year] DOA: 11/26/2020 PCP: Frederich Chick., MD    Brief Narrative:  Cory [name] is a 75 year old male with past medical history significant for type 2 diabetes mellitus, essential hypertension, hyperlipidemia who presented to [facility] ED on 7/21 with progressive ankle/foot pain.  Onset roughly 1 month ago with multiple ED visits with outpatient courses of Keflex and Bactrim.  Now with difficulty bearing weight on right foot.  MRI notable for right foot osteomyelitis.  Recent ABIs showed good arterial flow in the distal lower extremities.  Patient was started on IV antibiotics and admitted to the hospitalist service for further evaluation and treatment.  Orthopedics was consulted and plan for operative management.   Assessment & Plan:   Principal Problem:   Sepsis (HCC) Active Problems:   Hyperlipidemia   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)   Hypertension   Osteomyelitis of right foot (HCC)   Overweight (BMI 25.0-29.9)   Moderate protein-calorie malnutrition (HCC)   Sepsis, POA Right calcaneal osteomyelitis with cellulitis Patient presenting with progressive right foot pain, difficulty ambulating despite outpatient treatment with oral antibiotics.  On arrival, patient with tachycardia, leukocytosis.  MR right ankle with and without contrast with soft tissue irregularity posterior lateral aspect ankle with extensive ill-defined fluid within soft tissues consistent with cellulitis; acute osteomyelitis involving the lateral aspect of the calcaneal body with diffuse intramuscular edema consistent with myositis. Recent ABIs showed good arterial flow in the distal lower extremities.  Patient was seen initially by orthopedics, Dr. Lajoyce Corners who recommended right BKA.  Patient and family requested second opinion, and was seen by Dr. Everardo Pacific on 7/27 with recommendation that surgical debridement or treatment from a wound care center would  not be sufficient due to the severity of his wound and bone infection in which she is at risk for worsening infection and sepsis and recommended surgical intervention with Dr. Lajoyce Corners. --Orthopedics following, Dr. Lajoyce Corners; appreciate assistance --CRP 19.6 --ESR 81 --Blood cultures x2 7/21: No growth x5 days --Ceftriaxone 2 g IV every 24 hours --Zyvox 600 mg IV every 12 hours --Orthopedics tentatively plan surgical intervention on Friday, 12/04/2020 --P[facility] to [facility]  Acute renal failure, not POA Etiology likely multifactorial with poor oral intake, medications to include IV antibiotics. --Cr 0.76>>1.64>1.24>0.95 --IV antibiotics changed from vancomycin/Zosyn to Zyvox and ceftriaxone --Holding home losartan -- Discontinue IV fluids today --Repeat BMP in a.m.  Hyponatremia Sodium down to 128, suspect volume depletion with poor oral intake. --s/p IVF hydration with Na now up to 138 --Repeat BMP in the a.m.  Type 2 diabetes mellitus, with hyperglycemia Hemoglobin A1c 12.1, poorly controlled.  Patient reports inconsistencies with home medication regimen.  On Trulicity, metformin, Lantus 45 units subcutaneously daily at home.  Suspect dietary indiscretions. --Lantus 40 units subcutaneously qHS --SSI for further coverage --CBG before every meal/at bedtime --Diabetic educator consult  Essential hypertension --Metoprolol succinate 100 mg p.o. daily --Nifedipine 60 mg p.o. daily --Holding home losartan secondary to AKI as above --Hydralazine as needed  Hyperlipidemia: Continue atorvastatin 20 mg p.o. daily  BPH: Tamsulosin 0.4 mg p.o. nightly    DVT prophylaxis: Place and maintain sequential compression device Start: 11/27/20 1919   Code Status: Full Code Family Communication: No family present at bedside this morning, updated patient's niece via telephone this morning  Disposition Plan:  Level of care: Med-Surg Status is: Inpatient  Remains inpatient appropriate  because:Ongoing diagnostic testing needed not appropriate for outpatient work up,  IV treatments appropriate due to intensity of illness or inability to take PO, and Inpatient level of care appropriate due to severity of illness  Dispo: The patient is from: Home              Anticipated d/c is to: Home              Patient currently is not medically stable to d/c.   Difficult to place patient No   Consultants:  Orthopedics, Dr. Lajoyce Corners  Procedures:  None  Antimicrobials:  Vancomycin 7/21 - 7/24 Zosyn 7/22 - 7/24 Ceftriaxone 7/22 - 7/22; 7/27>> Flagyl 7/22 - 7/22 Zyvox 7/24>> Cefepime 7/21, 7/24 - 7/27   Subjective: Patient seen examined bedside, resting comfortably.  No complaints this morning other than requesting something to drink.  Patient and niece requesting second opinion regarding need for BKA for treatment of his osteomyelitis and right heel wound.  Seen by Dr. Everardo Pacific who agreed right BKA is the indicated surgery for his underlying issues.  States pain is currently controlled.  RN present at bedside. Denies headache, no fever/chills/night sweats, no nausea/vomiting/diarrhea, no chest pain, no palpitations, no shortness of breath, no abdominal pain.  No acute concerns overnight per nursing staff.  Objective: Vitals:   12/01/20 0429 12/01/20 2211 12/02/20 0549 12/02/20 1338  BP: 128/70 (!) 144/87 (!) 153/90 (!) 153/85  Pulse: 88 84 79 81  Resp: 16 16 16 18   Temp: 98.5 F (36.9 C) 98.5 F (36.9 C) 98.7 F (37.1 C) 97.9 F (36.6 C)  TempSrc: Oral Oral Oral Oral  SpO2: 95% 97% 98% 98%  Weight:      Height:        Intake/Output Summary (Last 24 hours) at 12/02/2020 1527 Last data filed at 12/02/2020 1409 Gross per 24 hour  Intake 6833.8 ml  Output 3300 ml  Net 3533.8 ml   Filed Weights   11/26/20 1825  Weight: 95.3 kg    Examination:  General exam: Appears calm and comfortable  Respiratory system: Clear to auscultation. Respiratory effort normal.  On room  air Cardiovascular system: S1 & S2 heard, RRR. No JVD, murmurs, rubs, gallops or clicks. No pedal edema. Gastrointestinal system: Abdomen is nondistended, soft and nontender. No organomegaly or masses felt. Normal bowel sounds heard. Central nervous system: Alert and oriented. No focal neurological deficits. Extremities: Moves all extremities independently with intact muscle strength, right foot with edema and tenderness to palpation, dressing in place, clean/dry/intact Skin: Right heel ulcer; otherwise no rashes, lesions or ulcers Psychiatry: Judgement and insight appear normal. Mood & affect appropriate.      Data Reviewed: I have personally reviewed following labs and imaging studies  CBC: Recent Labs  Lab 11/26/20 1839 11/27/20 0456 11/27/20 1958 11/28/20 0432 11/29/20 0312 12/01/20 0329  WBC 13.4* 12.3* 12.7* 11.9* 11.3* 9.6  NEUTROABS 11.7*  --   --   --   --   --   HGB 12.3* 11.8* 12.8* 12.2* 11.9* 11.3*  HCT 36.1* 35.2* 38.4* 36.3* 35.6* 33.7*  MCV 80.0 80.0 81.7 80.5 81.8 80.8  PLT 226 221 241 226 230 226   Basic Metabolic Panel: Recent Labs  Lab 11/28/20 0432 11/29/20 0312 11/29/20 1535 11/30/20 1300 12/01/20 0329 12/02/20 0346  NA 129* 128* 129* 133* 135 138  K 3.7 3.6  --  4.0 3.2* 3.6  CL 93* 92*  --  97* 103 102  CO2 25 28  --  27 25 26   GLUCOSE 150* 234*  --  240*  183* 163*  BUN 12 26*  --  21 15 13   CREATININE 0.76 1.64*  --  1.36* 1.24 0.95  CALCIUM 8.3* 8.5*  --  8.4* 8.3* 8.3*  MG  --   --   --   --   --  2.0   GFR: Estimated Creatinine Clearance: 81.6 mL/min (by C-G formula based on SCr of 0.95 mg/dL). Liver Function Tests: Recent Labs  Lab 11/26/20 1839 11/28/20 0432  AST 31 75*  ALT 22 36  ALKPHOS 75 70  BILITOT 1.0 0.9  PROT 7.5 7.0  ALBUMIN 3.0* 2.6*   No results for input(s): LIPASE, AMYLASE in the last 168 hours. No results for input(s): AMMONIA in the last 168 hours. Coagulation Profile: Recent Labs  Lab 11/26/20 1839  INR  1.2   Cardiac Enzymes: No results for input(s): CKTOTAL, CKMB, CKMBINDEX, TROPONINI in the last 168 hours. BNP (last 3 results) No results for input(s): PROBNP in the last 8760 hours. HbA1C: No results for input(s): HGBA1C in the last 72 hours. CBG: Recent Labs  Lab 12/01/20 1949 12/01/20 2354 12/02/20 0347 12/02/20 0737 12/02/20 1147  GLUCAP 213* 234* 158* 122* 120*   Lipid Profile: No results for input(s): CHOL, HDL, LDLCALC, TRIG, CHOLHDL, LDLDIRECT in the last 72 hours. Thyroid Function Tests: Recent Labs    11/29/20 1535  TSH 1.125   Anemia Panel: No results for input(s): VITAMINB12, FOLATE, FERRITIN, TIBC, IRON, RETICCTPCT in the last 72 hours. Sepsis Labs: Recent Labs  Lab 11/26/20 1839 11/26/20 2000  LATICACIDVEN 1.7 1.2    Recent Results (from the past 240 hour(s))  Culture, blood (Routine x 2)     Status: None   Collection Time: 11/26/20  6:39 PM   Specimen: BLOOD LEFT HAND  Result Value Ref Range Status   Specimen Description   Final    BLOOD LEFT HAND Performed at [facility], [address], [place], [place] [address]    Special Requests   Final    BOTTLES DRAWN AEROBIC AND ANAEROBIC Blood Culture adequate volume Performed at [facility], [address], [place], [place] [address]    Culture   Final    NO GROWTH 5 DAYS Performed at [facility], [address] [place], [place] [address]    Report Status 12/01/2020 FINAL  Final  Culture, blood (Routine x 2)     Status: None   Collection Time: 11/26/20  6:44 PM   Specimen: BLOOD  Result Value Ref Range Status   Specimen Description   Final    BLOOD BLOOD RIGHT HAND Performed at [facility], [address], [place], [place] [address]    Special Requests   Final    BOTTLES DRAWN AEROBIC AND ANAEROBIC Blood Culture adequate volume Performed at [facility] [address] [place], [place] [address]    Culture   Final     NO GROWTH 5 DAYS Performed at [facility], [address] [place], [place] [address]    Report Status 12/01/2020 FINAL  Final  Resp Panel by RT-PCR (Flu A&B, Covid) Nasopharyngeal Swab     Status: None   Collection Time: 11/26/20  7:00 PM   Specimen: Nasopharyngeal Swab; Nasopharyngeal(NP) swabs in vial transport medium  Result Value Ref Range Status   SARS Coronavirus 2 by RT PCR NEGATIVE NEGATIVE Final    Comment: (NOTE) SARS-CoV-2 target nucleic acids are NOT DETECTED.  The SARS-CoV-2 RNA is generally  detectable in upper respiratory specimens during the acute phase of infection. The lowest concentration of SARS-CoV-2 viral copies this assay can detect is 138 copies/mL. A negative result does not preclude SARS-Cov-2 infection and should not be used as the sole basis for treatment or other patient management decisions. A negative result may occur with  improper specimen collection/handling, submission of specimen other than nasopharyngeal swab, presence of viral mutation(s) within the areas targeted by this assay, and inadequate number of viral copies(<138 copies/mL). A negative result must be combined with clinical observations, patient history, and epidemiological information. The expected result is Negative.  Fact Sheet for Patients:  BloggerCourse.com  Fact Sheet for Healthcare Providers:  SeriousBroker.it  This test is no t yet approved or cleared by the [place] FDA and  has been authorized for detection and/or diagnosis of SARS-CoV-2 by FDA under an Emergency Use Authorization (EUA). This EUA will remain  in effect (meaning this test can be used) for the duration of the COVID-19 declaration under Section 564(b)(1) of the Act, 21 U.S.C.section 360bbb-3(b)(1), unless the authorization is terminated  or revoked sooner.       Influenza A by PCR NEGATIVE NEGATIVE Final   Influenza B by PCR NEGATIVE NEGATIVE  Final    Comment: (NOTE) The Xpert Xpress SARS-CoV-2/FLU/RSV plus assay is intended as an aid in the diagnosis of influenza from Nasopharyngeal swab specimens and should not be used as a sole basis for treatment. Nasal washings and aspirates are unacceptable for Xpert Xpress SARS-CoV-2/FLU/RSV testing.  Fact Sheet for Patients: BloggerCourse.com  Fact Sheet for Healthcare Providers: SeriousBroker.it  This test is not yet approved or cleared by the [place] FDA and has been authorized for detection and/or diagnosis of SARS-CoV-2 by FDA under an Emergency Use Authorization (EUA). This EUA will remain in effect (meaning this test can be used) for the duration of the COVID-19 declaration under Section 564(b)(1) of the Act, 21 U.S.C. section 360bbb-3(b)(1), unless the authorization is terminated or revoked.  Performed at [facility], [address] [place], [place] [address]   MRSA Next Gen by PCR, Nasal     Status: Abnormal   Collection Time: 11/27/20  2:08 AM   Specimen: Nasal Mucosa; Nasal Swab  Result Value Ref Range Status   MRSA by PCR Next Gen DETECTED (A) NOT DETECTED Final    Comment: RESULT CALLED TO, READ BACK BY AND VERIFIED WITH: DANIEL K. ON 11/27/2020 @ 0717 BY MECIAL J. (NOTE) The GeneXpert MRSA Assay (FDA approved for NASAL specimens only), is one component of a comprehensive MRSA colonization surveillance program. It is not intended to diagnose MRSA infection nor to guide or monitor treatment for MRSA infections. Test performance is not FDA approved in patients less than 29 years old. Performed at [facility] [place], [address] [place], [place] [address]   Aerobic/Anaerobic Culture w Gram Stain (surgical/deep wound)     Status: None (Preliminary result)   Collection Time: 11/27/20  4:58 AM   Specimen: Wound  Result Value Ref Range Status   Specimen Description   Final     WOUND RT FOOT Performed at [facility], [address] [place], [place] [address]    Special Requests   Final    NONE Performed at [facility], [address], [place], [place] [address]    Gram Stain   Final    NO WBC SEEN NO ORGANISMS SEEN Performed at [facility], [address] [place], [place] [address]    Culture   Final    MODERATE ESCHERICHIA COLI NO ANAEROBES ISOLATED; CULTURE IN PROGRESS FOR 5 DAYS    Report Status PENDING  Incomplete   Organism ID, Bacteria ESCHERICHIA COLI  Final      Susceptibility   Escherichia coli - MIC*    AMPICILLIN >=32 RESISTANT Resistant     CEFAZOLIN <=4 SENSITIVE Sensitive     CEFEPIME <=0.12 SENSITIVE Sensitive     CEFTAZIDIME <=1 SENSITIVE Sensitive     CEFTRIAXONE <=0.25 SENSITIVE Sensitive     CIPROFLOXACIN 0.5 SENSITIVE Sensitive     GENTAMICIN <=1 SENSITIVE Sensitive     IMIPENEM <=0.25 SENSITIVE Sensitive     TRIMETH/SULFA <=20 SENSITIVE Sensitive     AMPICILLIN/SULBACTAM >=32 RESISTANT Resistant     PIP/TAZO <=4 SENSITIVE Sensitive     * MODERATE ESCHERICHIA COLI         Radiology Studies: No results found.      Scheduled Meds:  (feeding supplement) PROSource Plus  30 mL Oral BID WC   atorvastatin  20 mg Oral QHS   Chlorhexidine Gluconate Cloth  6 each Topical Q0600   insulin aspart  0-9 Units Subcutaneous Q4H   insulin glargine  35 Units Subcutaneous QHS   metoprolol succinate  100 mg Oral Daily   mupirocin ointment  1 application Nasal BID   NIFEdipine  60 mg Oral Daily   Ensure Max Protein  11 oz Oral BID   tamsulosin  0.4 mg Oral QHS   Continuous Infusions:  sodium chloride 75 mL/hr at 12/01/20 1647   cefTRIAXone (ROCEPHIN)  IV     linezolid (ZYVOX) IV 600 mg (12/02/20 0837)     LOS: 6 days    Time spent: 38 minutes spent on chart review, discussion with nursing staff, consultants, updating family and interview/physical exam; more than 50% of that  time was spent in counseling and/or coordination of care.    Alvira Philips [place], DO [facility] Available via Epic secure chat 7am-7pm After these hours, please refer to coverage provider listed on [email or web address] 12/02/2020, 3:27 PM

## 2020-12-03 DIAGNOSIS — A419 Sepsis, unspecified organism: Secondary | ICD-10-CM | POA: Diagnosis not present

## 2020-12-03 DIAGNOSIS — E1165 Type 2 diabetes mellitus with hyperglycemia: Secondary | ICD-10-CM | POA: Diagnosis not present

## 2020-12-03 DIAGNOSIS — E440 Moderate protein-calorie malnutrition: Secondary | ICD-10-CM | POA: Diagnosis not present

## 2020-12-03 DIAGNOSIS — I10 Essential (primary) hypertension: Secondary | ICD-10-CM | POA: Diagnosis not present

## 2020-12-03 LAB — BASIC METABOLIC PANEL
Anion gap: 8 (ref 5–15)
BUN: 13 mg/dL (ref 8–23)
CO2: 27 mmol/L (ref 22–32)
Calcium: 8 mg/dL — ABNORMAL LOW (ref 8.9–10.3)
Chloride: 100 mmol/L (ref 98–111)
Creatinine, Ser: 1.07 mg/dL (ref 0.61–1.24)
GFR, Estimated: >60 mL/min (ref >60)
Glucose, Bld: 252 mg/dL — ABNORMAL HIGH (ref 70–99)
Potassium: 3.3 mmol/L — ABNORMAL LOW (ref 3.5–5.1)
Sodium: 135 mmol/L (ref 135–145)

## 2020-12-03 LAB — GLUCOSE, CAPILLARY
Glucose-Capillary: 134 mg/dL — ABNORMAL HIGH (ref 70–99)
Glucose-Capillary: 173 mg/dL — ABNORMAL HIGH (ref 70–99)
Glucose-Capillary: 181 mg/dL — ABNORMAL HIGH (ref 70–99)
Glucose-Capillary: 218 mg/dL — ABNORMAL HIGH (ref 70–99)
Glucose-Capillary: 223 mg/dL — ABNORMAL HIGH (ref 70–99)
Glucose-Capillary: 98 mg/dL (ref 70–99)

## 2020-12-03 LAB — AEROBIC/ANAEROBIC CULTURE W GRAM STAIN (SURGICAL/DEEP WOUND): Gram Stain: NONE SEEN

## 2020-12-03 MED ORDER — POTASSIUM CHLORIDE CRYS ER 20 MEQ PO TBCR
30.0000 meq | EXTENDED_RELEASE_TABLET | ORAL | Status: AC
  Administered 2020-12-03 (×2): 30 meq via ORAL
  Filled 2020-12-03 (×2): qty 1

## 2020-12-03 MED ORDER — SODIUM CHLORIDE 0.9 % IV SOLN: INTRAVENOUS | Status: AC

## 2020-12-03 MED ORDER — INSULIN GLARGINE 100 UNIT/ML ~~LOC~~ SOLN
50.0000 [IU] | Freq: Every day | SUBCUTANEOUS | Status: DC
  Administered 2020-12-03 – 2020-12-04 (×2): 50 [IU] via SUBCUTANEOUS
  Filled 2020-12-03 (×3): qty 0.5

## 2020-12-03 MED ORDER — INSULIN ASPART 100 UNIT/ML IJ SOLN
4.0000 [IU] | Freq: Three times a day (TID) | INTRAMUSCULAR | Status: DC
  Administered 2020-12-03 (×3): 4 [IU] via SUBCUTANEOUS

## 2020-12-03 NOTE — Plan of Care (Signed)
  Problem: Education: Goal: Knowledge of General Education information will improve Description: Including pain rating scale, medication(s)/side effects and non-pharmacologic comfort measures Outcome: Progressing   Problem: Activity: Goal: Risk for activity intolerance will decrease Outcome: Progressing   Problem: Pain Managment: Goal: General experience of comfort will improve Outcome: Progressing   

## 2020-12-03 NOTE — Plan of Care (Signed)
  Problem: Education: Goal: Knowledge of General Education information will improve Description: Including pain rating scale, medication(s)/side effects and non-pharmacologic comfort measures 12/03/2020 0738 by Beverly Sessions, RN Outcome: Progressing 12/03/2020 0737 by Beverly Sessions, RN Outcome: Progressing   Problem: Activity: Goal: Risk for activity intolerance will decrease 12/03/2020 0738 by Beverly Sessions, RN Outcome: Progressing 12/03/2020 0737 by Beverly Sessions, RN Outcome: Progressing   Problem: Pain Managment: Goal: General experience of comfort will improve 12/03/2020 0738 by Beverly Sessions, RN Outcome: Progressing 12/03/2020 0737 by Beverly Sessions, RN Outcome: Progressing

## 2020-12-03 NOTE — Anesthesia Preprocedure Evaluation (Addendum)
Anesthesia Evaluation  Patient identified by MRN, date of birth, ID band Patient awake    Reviewed: Allergy & Precautions, NPO status , Patient's Chart, lab work & pertinent test results  Airway Mallampati: II  TM Distance: >3 FB Neck ROM: Full    Dental no notable dental hx.    Pulmonary neg pulmonary ROS,    Pulmonary exam normal breath sounds clear to auscultation       Cardiovascular hypertension, negative cardio ROS Normal cardiovascular exam Rhythm:Regular Rate:Normal     Neuro/Psych negative neurological ROS  negative psych ROS   GI/Hepatic negative GI ROS, Neg liver ROS,   Endo/Other  negative endocrine ROSdiabetes, Poorly Controlled, Type 2  Renal/GU negative Renal ROS  negative genitourinary   Musculoskeletal negative musculoskeletal ROS (+)   Abdominal   Peds negative pediatric ROS (+)  Hematology negative hematology ROS (+)   Anesthesia Other Findings osteomyelitis  Reproductive/Obstetrics negative OB ROS                            Anesthesia Physical Anesthesia Plan  ASA: 3  Anesthesia Plan: Regional and General   Post-op Pain Management: GA combined w/ Regional for post-op pain   Induction:   PONV Risk Score and Plan: Ondansetron, Treatment may vary due to age or medical condition and Midazolam  Airway Management Planned: LMA  Additional Equipment: None  Intra-op Plan:   Post-operative Plan: Extubation in OR  Informed Consent: I have reviewed the patients History and Physical, chart, labs and discussed the procedure including the risks, benefits and alternatives for the proposed anesthesia with the patient or authorized representative who has indicated his/her understanding and acceptance.     Dental advisory given  Plan Discussed with: CRNA, Anesthesiologist and Surgeon  Anesthesia Plan Comments: (Pop/saph block. GA/LMA)       Anesthesia Quick  Evaluation

## 2020-12-03 NOTE — Progress Notes (Signed)
PROGRESS NOTE    Cory [name]  MRN:[id number] DOB: 11/21/1945 DOA: 11/26/2020 PCP: Foster, Robert M Jr., MD    Brief Narrative:  Cory [name] is a 75-year-old male with past medical history significant for type 2 diabetes mellitus, essential hypertension, hyperlipidemia who presented to [facility] ED on 7/21 with progressive ankle/foot pain.  Onset roughly 1 month ago with multiple ED visits with outpatient courses of Keflex and Bactrim.  Now with difficulty bearing weight on right foot.  MRI notable for right foot osteomyelitis.  Recent ABIs showed good arterial flow in the distal lower extremities.  Patient was started on IV antibiotics and admitted to the hospitalist service for further evaluation and treatment.  Orthopedics was consulted and plan for operative management.   Assessment & Plan:   Principal Problem:   Sepsis (HCC) Active Problems:   Hyperlipidemia   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)   Hypertension   Osteomyelitis of right foot (HCC)   Overweight (BMI 25.0-29.9)   Moderate protein-calorie malnutrition (HCC)   Sepsis, POA Right calcaneal osteomyelitis with cellulitis Patient presenting with progressive right foot pain, difficulty ambulating despite outpatient treatment with oral antibiotics.  On arrival, patient with tachycardia, leukocytosis.  MR right ankle with and without contrast with soft tissue irregularity posterior lateral aspect ankle with extensive ill-defined fluid within soft tissues consistent with cellulitis; acute osteomyelitis involving the lateral aspect of the calcaneal body with diffuse intramuscular edema consistent with myositis. Recent ABIs showed good arterial flow in the distal lower extremities.  Patient was seen initially by orthopedics, Dr. Duda who recommended right BKA.  Patient and family requested second opinion, and was seen by Dr. Varkey on 7/27 with recommendation that surgical debridement or treatment from a wound care center would  not be sufficient due to the severity of his wound and bone infection in which she is at risk for worsening infection and sepsis and recommended surgical intervention with Dr. Duda. --Orthopedics following, Dr. Duda; appreciate assistance --CRP 19.6 --ESR 81 --Blood cultures x2 7/21: No growth x5 days --Ceftriaxone 2 g IV every 24 hours --Zyvox 600 mg IV every 12 hours --Orthopedics plan surgical intervention with BKA on Friday, 12/04/2020; NPO after MN --Pending transfer to [place]  Acute renal failure, not POA Etiology likely multifactorial with poor oral intake, medications to include IV antibiotics. --Cr 0.76>>1.64>1.24>0.95>1.07 --IV antibiotics changed from vancomycin/Zosyn to Zyvox and ceftriaxone --Holding home losartan --Repeat BMP in a.m.  Hyponatremia Sodium down to 128, suspect volume depletion with poor oral intake. --s/p IVF hydration with Na now up to 135 --Repeat BMP in the a.m.  Type 2 diabetes mellitus, with hyperglycemia Hemoglobin A1c 12.1, poorly controlled.  Patient reports inconsistencies with home medication regimen.  On Trulicity, metformin, Lantus 45 units subcutaneously daily at home.  Suspect dietary indiscretions. --Diabetic educator following, appreciate assistance --Lantus 50 units subcutaneously qHS, Novolog 4u TIDAC --SSI for further coverage --CBG before every meal/at bedtime  Essential hypertension --Metoprolol succinate 100 mg p.o. daily --Nifedipine 60 mg p.o. daily --Holding home losartan secondary to AKI as above --Hydralazine as needed  Hyperlipidemia: Continue atorvastatin 20 mg p.o. daily  BPH: Tamsulosin 0.4 mg p.o. nightly    DVT prophylaxis: Place and maintain sequential compression device Start: 11/27/20 1919   Code Status: Full Code Family Communication: No family present at bedside this morning, updated patient's niece via telephone yesterday morning  Disposition Plan:  Level of care: Med-Surg Status is:  Inpatient  Remains inpatient appropriate because:Ongoing diagnostic testing needed not appropriate   PROGRESS NOTE    Cory [name]  MRN:[id number] DOB: 10/17/1945 DOA: 11/26/2020 PCP: Foster, Robert M Jr., MD    Brief Narrative:  Cory [name] is a 75-year-old male with past medical history significant for type 2 diabetes mellitus, essential hypertension, hyperlipidemia who presented to [facility] ED on 7/21 with progressive ankle/foot pain.  Onset roughly 1 month ago with multiple ED visits with outpatient courses of Keflex and Bactrim.  Now with difficulty bearing weight on right foot.  MRI notable for right foot osteomyelitis.  Recent ABIs showed good arterial flow in the distal lower extremities.  Patient was started on IV antibiotics and admitted to the hospitalist service for further evaluation and treatment.  Orthopedics was consulted and plan for operative management.   Assessment & Plan:   Principal Problem:   Sepsis (HCC) Active Problems:   Hyperlipidemia   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)   Hypertension   Osteomyelitis of right foot (HCC)   Overweight (BMI 25.0-29.9)   Moderate protein-calorie malnutrition (HCC)   Sepsis, POA Right calcaneal osteomyelitis with cellulitis Patient presenting with progressive right foot pain, difficulty ambulating despite outpatient treatment with oral antibiotics.  On arrival, patient with tachycardia, leukocytosis.  MR right ankle with and without contrast with soft tissue irregularity posterior lateral aspect ankle with extensive ill-defined fluid within soft tissues consistent with cellulitis; acute osteomyelitis involving the lateral aspect of the calcaneal body with diffuse intramuscular edema consistent with myositis. Recent ABIs showed good arterial flow in the distal lower extremities.  Patient was seen initially by orthopedics, Dr. Duda who recommended right BKA.  Patient and family requested second opinion, and was seen by Dr. Varkey on 7/27 with recommendation that surgical debridement or treatment from a wound care center would  not be sufficient due to the severity of his wound and bone infection in which she is at risk for worsening infection and sepsis and recommended surgical intervention with Dr. Duda. --Orthopedics following, Dr. Duda; appreciate assistance --CRP 19.6 --ESR 81 --Blood cultures x2 7/21: No growth x5 days --Ceftriaxone 2 g IV every 24 hours --Zyvox 600 mg IV every 12 hours --Orthopedics plan surgical intervention with BKA on Friday, 12/04/2020; NPO after MN --Pending transfer to [place]  Acute renal failure, not POA Etiology likely multifactorial with poor oral intake, medications to include IV antibiotics. --Cr 0.76>>1.64>1.24>0.95>1.07 --IV antibiotics changed from vancomycin/Zosyn to Zyvox and ceftriaxone --Holding home losartan --Repeat BMP in a.m.  Hyponatremia Sodium down to 128, suspect volume depletion with poor oral intake. --s/p IVF hydration with Na now up to 135 --Repeat BMP in the a.m.  Type 2 diabetes mellitus, with hyperglycemia Hemoglobin A1c 12.1, poorly controlled.  Patient reports inconsistencies with home medication regimen.  On Trulicity, metformin, Lantus 45 units subcutaneously daily at home.  Suspect dietary indiscretions. --Diabetic educator following, appreciate assistance --Lantus 50 units subcutaneously qHS, Novolog 4u TIDAC --SSI for further coverage --CBG before every meal/at bedtime  Essential hypertension --Metoprolol succinate 100 mg p.o. daily --Nifedipine 60 mg p.o. daily --Holding home losartan secondary to AKI as above --Hydralazine as needed  Hyperlipidemia: Continue atorvastatin 20 mg p.o. daily  BPH: Tamsulosin 0.4 mg p.o. nightly    DVT prophylaxis: Place and maintain sequential compression device Start: 11/27/20 1919   Code Status: Full Code Family Communication: No family present at bedside this morning, updated patient's niece via telephone yesterday morning  Disposition Plan:  Level of care: Med-Surg Status is:  Inpatient  Remains inpatient appropriate because:Ongoing diagnostic testing needed not appropriate   PROGRESS NOTE    Cory [name]  VFI:[id number] DOB: 1945-10-20 DOA: 11/26/2020 PCP: Sherald Hess., MD    Brief Narrative:  Cory [name] is a 75 year old male with past medical history significant for type 2 diabetes mellitus, essential hypertension, hyperlipidemia who presented to [facility] ED on 7/21 with progressive ankle/foot pain.  Onset roughly 1 month ago with multiple ED visits with outpatient courses of Keflex and Bactrim.  Now with difficulty bearing weight on right foot.  MRI notable for right foot osteomyelitis.  Recent ABIs showed good arterial flow in the distal lower extremities.  Patient was started on IV antibiotics and admitted to the hospitalist service for further evaluation and treatment.  Orthopedics was consulted and plan for operative management.   Assessment & Plan:   Principal Problem:   Sepsis ([place]) Active Problems:   Hyperlipidemia   Hyperglycemia due to diabetes mellitus ([place])   Hypertension   Osteomyelitis of right foot (HCC)   Overweight (BMI 25.0-29.9)   Moderate protein-calorie malnutrition (HCC)   Sepsis, POA Right calcaneal osteomyelitis with cellulitis Patient presenting with progressive right foot pain, difficulty ambulating despite outpatient treatment with oral antibiotics.  On arrival, patient with tachycardia, leukocytosis.  MR right ankle with and without contrast with soft tissue irregularity posterior lateral aspect ankle with extensive ill-defined fluid within soft tissues consistent with cellulitis; acute osteomyelitis involving the lateral aspect of the calcaneal body with diffuse intramuscular edema consistent with myositis. Recent ABIs showed good arterial flow in the distal lower extremities.  Patient was seen initially by orthopedics, Dr. Sharol Given who recommended right BKA.  Patient and family requested second opinion, and was seen by Dr. Griffin Basil on 7/27 with recommendation that surgical debridement or treatment from a wound care center would  not be sufficient due to the severity of his wound and bone infection in which she is at risk for worsening infection and sepsis and recommended surgical intervention with Dr. Sharol Given. --Orthopedics following, Dr. Sharol Given; appreciate assistance --CRP 19.6 --ESR 81 --Blood cultures x2 7/21: No growth x5 days --Ceftriaxone 2 g IV every 24 hours --Zyvox 600 mg IV every 12 hours --Orthopedics plan surgical intervention with BKA on Friday, 12/04/2020; NPO after MN --Pending transfer to [facility]  Acute renal failure, not POA Etiology likely multifactorial with poor oral intake, medications to include IV antibiotics. --Cr 0.76>>1.64>1.24>0.95>1.07 --IV antibiotics changed from vancomycin/Zosyn to Zyvox and ceftriaxone --Holding home losartan --Repeat BMP in a.m.  Hyponatremia Sodium down to 128, suspect volume depletion with poor oral intake. --s/p IVF hydration with Na now up to 135 --Repeat BMP in the a.m.  Type 2 diabetes mellitus, with hyperglycemia Hemoglobin A1c 12.1, poorly controlled.  Patient reports inconsistencies with home medication regimen.  On Trulicity, metformin, Lantus 45 units subcutaneously daily at home.  Suspect dietary indiscretions. --Diabetic educator following, appreciate assistance --Lantus 50 units subcutaneously qHS, Novolog 4u TIDAC --SSI for further coverage --CBG before every meal/at bedtime  Essential hypertension --Metoprolol succinate 100 mg p.o. daily --Nifedipine 60 mg p.o. daily --Holding home losartan secondary to AKI as above --Hydralazine as needed  Hyperlipidemia: Continue atorvastatin 20 mg p.o. daily  BPH: Tamsulosin 0.4 mg p.o. nightly    DVT prophylaxis: Place and maintain sequential compression device Start: 11/27/20 1919   Code Status: Full Code Family Communication: No family present at bedside this morning, updated patient's niece via telephone yesterday morning  Disposition Plan:  Level of care: Med-Surg Status is:  Inpatient  Remains inpatient appropriate because:Ongoing diagnostic testing needed not appropriate  PROGRESS NOTE    Cory [name]  VFI:[id number] DOB: 1945-10-20 DOA: 11/26/2020 PCP: Sherald Hess., MD    Brief Narrative:  Cory [name] is a 75 year old male with past medical history significant for type 2 diabetes mellitus, essential hypertension, hyperlipidemia who presented to [facility] ED on 7/21 with progressive ankle/foot pain.  Onset roughly 1 month ago with multiple ED visits with outpatient courses of Keflex and Bactrim.  Now with difficulty bearing weight on right foot.  MRI notable for right foot osteomyelitis.  Recent ABIs showed good arterial flow in the distal lower extremities.  Patient was started on IV antibiotics and admitted to the hospitalist service for further evaluation and treatment.  Orthopedics was consulted and plan for operative management.   Assessment & Plan:   Principal Problem:   Sepsis ([place]) Active Problems:   Hyperlipidemia   Hyperglycemia due to diabetes mellitus ([place])   Hypertension   Osteomyelitis of right foot (HCC)   Overweight (BMI 25.0-29.9)   Moderate protein-calorie malnutrition (HCC)   Sepsis, POA Right calcaneal osteomyelitis with cellulitis Patient presenting with progressive right foot pain, difficulty ambulating despite outpatient treatment with oral antibiotics.  On arrival, patient with tachycardia, leukocytosis.  MR right ankle with and without contrast with soft tissue irregularity posterior lateral aspect ankle with extensive ill-defined fluid within soft tissues consistent with cellulitis; acute osteomyelitis involving the lateral aspect of the calcaneal body with diffuse intramuscular edema consistent with myositis. Recent ABIs showed good arterial flow in the distal lower extremities.  Patient was seen initially by orthopedics, Dr. Sharol Given who recommended right BKA.  Patient and family requested second opinion, and was seen by Dr. Griffin Basil on 7/27 with recommendation that surgical debridement or treatment from a wound care center would  not be sufficient due to the severity of his wound and bone infection in which she is at risk for worsening infection and sepsis and recommended surgical intervention with Dr. Sharol Given. --Orthopedics following, Dr. Sharol Given; appreciate assistance --CRP 19.6 --ESR 81 --Blood cultures x2 7/21: No growth x5 days --Ceftriaxone 2 g IV every 24 hours --Zyvox 600 mg IV every 12 hours --Orthopedics plan surgical intervention with BKA on Friday, 12/04/2020; NPO after MN --Pending transfer to [facility]  Acute renal failure, not POA Etiology likely multifactorial with poor oral intake, medications to include IV antibiotics. --Cr 0.76>>1.64>1.24>0.95>1.07 --IV antibiotics changed from vancomycin/Zosyn to Zyvox and ceftriaxone --Holding home losartan --Repeat BMP in a.m.  Hyponatremia Sodium down to 128, suspect volume depletion with poor oral intake. --s/p IVF hydration with Na now up to 135 --Repeat BMP in the a.m.  Type 2 diabetes mellitus, with hyperglycemia Hemoglobin A1c 12.1, poorly controlled.  Patient reports inconsistencies with home medication regimen.  On Trulicity, metformin, Lantus 45 units subcutaneously daily at home.  Suspect dietary indiscretions. --Diabetic educator following, appreciate assistance --Lantus 50 units subcutaneously qHS, Novolog 4u TIDAC --SSI for further coverage --CBG before every meal/at bedtime  Essential hypertension --Metoprolol succinate 100 mg p.o. daily --Nifedipine 60 mg p.o. daily --Holding home losartan secondary to AKI as above --Hydralazine as needed  Hyperlipidemia: Continue atorvastatin 20 mg p.o. daily  BPH: Tamsulosin 0.4 mg p.o. nightly    DVT prophylaxis: Place and maintain sequential compression device Start: 11/27/20 1919   Code Status: Full Code Family Communication: No family present at bedside this morning, updated patient's niece via telephone yesterday morning  Disposition Plan:  Level of care: Med-Surg Status is:  Inpatient  Remains inpatient appropriate because:Ongoing diagnostic testing needed not appropriate  PROGRESS NOTE    Cory [name]  MRN:[id number] DOB: 10/17/1945 DOA: 11/26/2020 PCP: Foster, Robert M Jr., MD    Brief Narrative:  Cory [name] is a 75-year-old male with past medical history significant for type 2 diabetes mellitus, essential hypertension, hyperlipidemia who presented to [facility] ED on 7/21 with progressive ankle/foot pain.  Onset roughly 1 month ago with multiple ED visits with outpatient courses of Keflex and Bactrim.  Now with difficulty bearing weight on right foot.  MRI notable for right foot osteomyelitis.  Recent ABIs showed good arterial flow in the distal lower extremities.  Patient was started on IV antibiotics and admitted to the hospitalist service for further evaluation and treatment.  Orthopedics was consulted and plan for operative management.   Assessment & Plan:   Principal Problem:   Sepsis (HCC) Active Problems:   Hyperlipidemia   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)   Hypertension   Osteomyelitis of right foot (HCC)   Overweight (BMI 25.0-29.9)   Moderate protein-calorie malnutrition (HCC)   Sepsis, POA Right calcaneal osteomyelitis with cellulitis Patient presenting with progressive right foot pain, difficulty ambulating despite outpatient treatment with oral antibiotics.  On arrival, patient with tachycardia, leukocytosis.  MR right ankle with and without contrast with soft tissue irregularity posterior lateral aspect ankle with extensive ill-defined fluid within soft tissues consistent with cellulitis; acute osteomyelitis involving the lateral aspect of the calcaneal body with diffuse intramuscular edema consistent with myositis. Recent ABIs showed good arterial flow in the distal lower extremities.  Patient was seen initially by orthopedics, Dr. Duda who recommended right BKA.  Patient and family requested second opinion, and was seen by Dr. Varkey on 7/27 with recommendation that surgical debridement or treatment from a wound care center would  not be sufficient due to the severity of his wound and bone infection in which she is at risk for worsening infection and sepsis and recommended surgical intervention with Dr. Duda. --Orthopedics following, Dr. Duda; appreciate assistance --CRP 19.6 --ESR 81 --Blood cultures x2 7/21: No growth x5 days --Ceftriaxone 2 g IV every 24 hours --Zyvox 600 mg IV every 12 hours --Orthopedics plan surgical intervention with BKA on Friday, 12/04/2020; NPO after MN --Pending transfer to [place]  Acute renal failure, not POA Etiology likely multifactorial with poor oral intake, medications to include IV antibiotics. --Cr 0.76>>1.64>1.24>0.95>1.07 --IV antibiotics changed from vancomycin/Zosyn to Zyvox and ceftriaxone --Holding home losartan --Repeat BMP in a.m.  Hyponatremia Sodium down to 128, suspect volume depletion with poor oral intake. --s/p IVF hydration with Na now up to 135 --Repeat BMP in the a.m.  Type 2 diabetes mellitus, with hyperglycemia Hemoglobin A1c 12.1, poorly controlled.  Patient reports inconsistencies with home medication regimen.  On Trulicity, metformin, Lantus 45 units subcutaneously daily at home.  Suspect dietary indiscretions. --Diabetic educator following, appreciate assistance --Lantus 50 units subcutaneously qHS, Novolog 4u TIDAC --SSI for further coverage --CBG before every meal/at bedtime  Essential hypertension --Metoprolol succinate 100 mg p.o. daily --Nifedipine 60 mg p.o. daily --Holding home losartan secondary to AKI as above --Hydralazine as needed  Hyperlipidemia: Continue atorvastatin 20 mg p.o. daily  BPH: Tamsulosin 0.4 mg p.o. nightly    DVT prophylaxis: Place and maintain sequential compression device Start: 11/27/20 1919   Code Status: Full Code Family Communication: No family present at bedside this morning, updated patient's niece via telephone yesterday morning  Disposition Plan:  Level of care: Med-Surg Status is:  Inpatient  Remains inpatient appropriate because:Ongoing diagnostic testing needed not appropriate

## 2020-12-03 NOTE — Plan of Care (Signed)
  Problem: Coping: Goal: Level of anxiety will decrease Outcome: Progressing   Problem: Pain Managment: Goal: General experience of comfort will improve Outcome: Progressing   

## 2020-12-04 ENCOUNTER — Inpatient Hospital Stay (HOSPITAL_COMMUNITY): Payer: Medicare HMO | Admitting: Anesthesiology

## 2020-12-04 ENCOUNTER — Encounter (HOSPITAL_COMMUNITY): Payer: Self-pay | Admitting: Internal Medicine

## 2020-12-04 ENCOUNTER — Encounter (HOSPITAL_COMMUNITY)
Admission: EM | Disposition: A | Discharge status: Skilled Nursing Facility | Payer: Self-pay | Source: Home / Self Care | Attending: Internal Medicine

## 2020-12-04 DIAGNOSIS — I10 Essential (primary) hypertension: Secondary | ICD-10-CM | POA: Diagnosis not present

## 2020-12-04 DIAGNOSIS — M86271 Subacute osteomyelitis, right ankle and foot: Secondary | ICD-10-CM | POA: Diagnosis not present

## 2020-12-04 DIAGNOSIS — E785 Hyperlipidemia, unspecified: Secondary | ICD-10-CM | POA: Diagnosis not present

## 2020-12-04 DIAGNOSIS — E1165 Type 2 diabetes mellitus with hyperglycemia: Secondary | ICD-10-CM | POA: Diagnosis not present

## 2020-12-04 DIAGNOSIS — A419 Sepsis, unspecified organism: Secondary | ICD-10-CM | POA: Diagnosis not present

## 2020-12-04 DIAGNOSIS — E440 Moderate protein-calorie malnutrition: Secondary | ICD-10-CM | POA: Diagnosis not present

## 2020-12-04 HISTORY — PX: APPLICATION OF WOUND VAC: SHX5189

## 2020-12-04 HISTORY — PX: AMPUTATION: SHX166

## 2020-12-04 LAB — BASIC METABOLIC PANEL
Anion gap: 8 (ref 5–15)
BUN: 10 mg/dL (ref 8–23)
CO2: 27 mmol/L (ref 22–32)
Calcium: 7.9 mg/dL — ABNORMAL LOW (ref 8.9–10.3)
Chloride: 100 mmol/L (ref 98–111)
Creatinine, Ser: 0.96 mg/dL (ref 0.61–1.24)
GFR, Estimated: >60 mL/min (ref >60)
Glucose, Bld: 111 mg/dL — ABNORMAL HIGH (ref 70–99)
Potassium: 3.2 mmol/L — ABNORMAL LOW (ref 3.5–5.1)
Sodium: 135 mmol/L (ref 135–145)

## 2020-12-04 LAB — GLUCOSE, CAPILLARY
Glucose-Capillary: 127 mg/dL — ABNORMAL HIGH (ref 70–99)
Glucose-Capillary: 98 mg/dL (ref 70–99)
Glucose-Capillary: 75 mg/dL (ref 70–99)
Glucose-Capillary: 105 mg/dL — ABNORMAL HIGH (ref 70–99)
Glucose-Capillary: 155 mg/dL — ABNORMAL HIGH (ref 70–99)
Glucose-Capillary: 306 mg/dL — ABNORMAL HIGH (ref 70–99)
Glucose-Capillary: 210 mg/dL — ABNORMAL HIGH (ref 70–99)

## 2020-12-04 LAB — CBC
HCT: 32.6 % — ABNORMAL LOW (ref 39.0–52.0)
Hemoglobin: 11 g/dL — ABNORMAL LOW (ref 13.0–17.0)
MCH: 27.3 pg (ref 26.0–34.0)
MCHC: 33.7 g/dL (ref 30.0–36.0)
MCV: 80.9 fL (ref 80.0–100.0)
Platelets: 205 10*3/uL (ref 150–400)
RBC: 4.03 MIL/uL — ABNORMAL LOW (ref 4.22–5.81)
RDW: 12.2 % (ref 11.5–15.5)
WBC: 5.7 10*3/uL (ref 4.0–10.5)
nRBC: 0 % (ref 0.0–0.2)

## 2020-12-04 LAB — MAGNESIUM: Magnesium: 1.8 mg/dL (ref 1.7–2.4)

## 2020-12-04 SURGERY — AMPUTATION BELOW KNEE
Anesthesia: Regional | Site: Knee | Laterality: Right

## 2020-12-04 MED ORDER — MIDAZOLAM HCL 2 MG/2ML IJ SOLN
INTRAMUSCULAR | Status: AC
  Administered 2020-12-04: 1 mg via INTRAVENOUS
  Filled 2020-12-04: qty 2

## 2020-12-04 MED ORDER — ALUM & MAG HYDROXIDE-SIMETH 200-200-20 MG/5ML PO SUSP: 15.0000 mL | ORAL | Status: DC | PRN

## 2020-12-04 MED ORDER — ASCORBIC ACID 500 MG PO TABS
1000.0000 mg | ORAL_TABLET | Freq: Every day | ORAL | Status: DC
  Administered 2020-12-04 – 2020-12-08 (×5): 1000 mg via ORAL
  Filled 2020-12-04 (×5): qty 2

## 2020-12-04 MED ORDER — METOPROLOL SUCCINATE ER 25 MG PO TB24
ORAL_TABLET | ORAL | Status: AC
  Administered 2020-12-04: 100 mg via ORAL
  Filled 2020-12-04: qty 4

## 2020-12-04 MED ORDER — DROPERIDOL 2.5 MG/ML IJ SOLN: 0.6250 mg | Freq: Once | INTRAMUSCULAR | Status: DC | PRN

## 2020-12-04 MED ORDER — MAGNESIUM CITRATE PO SOLN: 1.0000 | Freq: Once | ORAL | Status: DC | PRN

## 2020-12-04 MED ORDER — ZINC SULFATE 220 (50 ZN) MG PO CAPS
220.0000 mg | ORAL_CAPSULE | Freq: Every day | ORAL | Status: DC
Start: 2020-12-04 — End: 2020-12-09
  Administered 2020-12-04 – 2020-12-08 (×5): 220 mg via ORAL
  Filled 2020-12-04 (×5): qty 1

## 2020-12-04 MED ORDER — POTASSIUM CHLORIDE CRYS ER 20 MEQ PO TBCR: 20.0000 meq | EXTENDED_RELEASE_TABLET | Freq: Every day | ORAL | Status: DC | PRN

## 2020-12-04 MED ORDER — GABAPENTIN 300 MG PO CAPS
300.0000 mg | ORAL_CAPSULE | Freq: Three times a day (TID) | ORAL | Status: DC
  Administered 2020-12-04 – 2020-12-08 (×12): 300 mg via ORAL
  Filled 2020-12-04 (×12): qty 1

## 2020-12-04 MED ORDER — SODIUM CHLORIDE 0.9 % IV SOLN: INTRAVENOUS | Status: DC

## 2020-12-04 MED ORDER — LABETALOL HCL 5 MG/ML IV SOLN
10.0000 mg | INTRAVENOUS | Status: DC | PRN
Start: 2020-12-04 — End: 2020-12-05
  Administered 2020-12-05: 10 mg via INTRAVENOUS
  Filled 2020-12-04: qty 4

## 2020-12-04 MED ORDER — ROPIVACAINE HCL 5 MG/ML IJ SOLN
INTRAMUSCULAR | Status: DC | PRN
  Administered 2020-12-04: 30 mL via PERINEURAL

## 2020-12-04 MED ORDER — ADULT MULTIVITAMIN W/MINERALS CH
1.0000 | ORAL_TABLET | Freq: Every day | ORAL | Status: DC
  Administered 2020-12-04 – 2020-12-08 (×5): 1 via ORAL
  Filled 2020-12-04 (×6): qty 1

## 2020-12-04 MED ORDER — METOPROLOL TARTRATE 5 MG/5ML IV SOLN: 2.0000 mg | INTRAVENOUS | Status: DC | PRN

## 2020-12-04 MED ORDER — 0.9 % SODIUM CHLORIDE (POUR BTL) OPTIME
TOPICAL | Status: DC | PRN
  Administered 2020-12-04: 1000 mL

## 2020-12-04 MED ORDER — OXYCODONE HCL 5 MG/5ML PO SOLN: 5.0000 mg | Freq: Once | ORAL | Status: DC | PRN

## 2020-12-04 MED ORDER — FENTANYL CITRATE (PF) 250 MCG/5ML IJ SOLN
INTRAMUSCULAR | Status: DC | PRN
  Administered 2020-12-04: 50 ug via INTRAVENOUS

## 2020-12-04 MED ORDER — PROMETHAZINE HCL 25 MG/ML IJ SOLN: 6.2500 mg | INTRAMUSCULAR | Status: DC | PRN

## 2020-12-04 MED ORDER — GUAIFENESIN-DM 100-10 MG/5ML PO SYRP: 15.0000 mL | ORAL_SOLUTION | ORAL | Status: DC | PRN

## 2020-12-04 MED ORDER — CHLORHEXIDINE GLUCONATE 0.12 % MT SOLN: 15.0000 mL | Freq: Once | OROMUCOSAL | Status: AC

## 2020-12-04 MED ORDER — FENTANYL CITRATE (PF) 100 MCG/2ML IJ SOLN
INTRAMUSCULAR | Status: AC
  Administered 2020-12-04: 50 ug via INTRAVENOUS
  Filled 2020-12-04: qty 2

## 2020-12-04 MED ORDER — BISACODYL 5 MG PO TBEC: 5.0000 mg | DELAYED_RELEASE_TABLET | Freq: Every day | ORAL | Status: DC | PRN

## 2020-12-04 MED ORDER — FENTANYL CITRATE (PF) 250 MCG/5ML IJ SOLN
INTRAMUSCULAR | Status: AC
  Filled 2020-12-04: qty 5

## 2020-12-04 MED ORDER — DOCUSATE SODIUM 100 MG PO CAPS
100.0000 mg | ORAL_CAPSULE | Freq: Every day | ORAL | Status: DC
  Administered 2020-12-05 – 2020-12-08 (×4): 100 mg via ORAL
  Filled 2020-12-04 (×5): qty 1

## 2020-12-04 MED ORDER — ONDANSETRON HCL 4 MG/2ML IJ SOLN: 4.0000 mg | Freq: Four times a day (QID) | INTRAMUSCULAR | Status: DC | PRN

## 2020-12-04 MED ORDER — PANTOPRAZOLE SODIUM 40 MG PO TBEC
40.0000 mg | DELAYED_RELEASE_TABLET | Freq: Every day | ORAL | Status: DC
  Administered 2020-12-04 – 2020-12-08 (×5): 40 mg via ORAL
  Filled 2020-12-04 (×6): qty 1

## 2020-12-04 MED ORDER — ONDANSETRON HCL 4 MG/2ML IJ SOLN
INTRAMUSCULAR | Status: DC | PRN
  Administered 2020-12-04: 4 mg via INTRAVENOUS

## 2020-12-04 MED ORDER — ORAL CARE MOUTH RINSE: 15.0000 mL | Freq: Once | OROMUCOSAL | Status: AC

## 2020-12-04 MED ORDER — PROPOFOL 10 MG/ML IV BOLUS
INTRAVENOUS | Status: DC | PRN
  Administered 2020-12-04: 100 mg via INTRAVENOUS

## 2020-12-04 MED ORDER — SODIUM CHLORIDE 0.9 % IV SOLN
2.0000 g | Freq: Three times a day (TID) | INTRAVENOUS | Status: DC
  Administered 2020-12-04: 2 g via INTRAVENOUS
  Filled 2020-12-04: qty 2

## 2020-12-04 MED ORDER — POLYETHYLENE GLYCOL 3350 17 G PO PACK: 17.0000 g | PACK | Freq: Every day | ORAL | Status: DC | PRN

## 2020-12-04 MED ORDER — LACTATED RINGERS IV SOLN: INTRAVENOUS | Status: DC

## 2020-12-04 MED ORDER — HYDRALAZINE HCL 20 MG/ML IJ SOLN
10.0000 mg | INTRAMUSCULAR | Status: DC | PRN
  Administered 2020-12-04: 10 mg via INTRAVENOUS

## 2020-12-04 MED ORDER — LACTATED RINGERS IV SOLN: INTRAVENOUS | Status: DC | PRN

## 2020-12-04 MED ORDER — FENTANYL CITRATE (PF) 100 MCG/2ML IJ SOLN: 50.0000 ug | Freq: Once | INTRAMUSCULAR | Status: AC

## 2020-12-04 MED ORDER — LIDOCAINE 2% (20 MG/ML) 5 ML SYRINGE
INTRAMUSCULAR | Status: DC | PRN
  Administered 2020-12-04: 60 mg via INTRAVENOUS

## 2020-12-04 MED ORDER — PHENOL 1.4 % MT LIQD: 1.0000 | OROMUCOSAL | Status: DC | PRN

## 2020-12-04 MED ORDER — HYDRALAZINE HCL 20 MG/ML IJ SOLN: 5.0000 mg | INTRAMUSCULAR | Status: DC | PRN

## 2020-12-04 MED ORDER — OXYCODONE HCL 5 MG PO TABS: 5.0000 mg | ORAL_TABLET | Freq: Once | ORAL | Status: DC | PRN

## 2020-12-04 MED ORDER — JUVEN PO PACK
1.0000 | PACK | Freq: Two times a day (BID) | ORAL | Status: DC
  Administered 2020-12-04 – 2020-12-08 (×7): 1 via ORAL
  Filled 2020-12-04 (×10): qty 1

## 2020-12-04 MED ORDER — FENTANYL CITRATE (PF) 100 MCG/2ML IJ SOLN: 25.0000 ug | INTRAMUSCULAR | Status: DC | PRN

## 2020-12-04 MED ORDER — MIDAZOLAM HCL 2 MG/2ML IJ SOLN: 1.0000 mg | Freq: Once | INTRAMUSCULAR | Status: AC

## 2020-12-04 MED ORDER — HYDRALAZINE HCL 20 MG/ML IJ SOLN
INTRAMUSCULAR | Status: AC
  Filled 2020-12-04: qty 1

## 2020-12-04 MED ORDER — MAGNESIUM SULFATE 2 GM/50ML IV SOLN: 2.0000 g | Freq: Every day | INTRAVENOUS | Status: DC | PRN

## 2020-12-04 MED ORDER — CHLORHEXIDINE GLUCONATE 0.12 % MT SOLN
OROMUCOSAL | Status: AC
  Administered 2020-12-04: 15 mL via OROMUCOSAL
  Filled 2020-12-04: qty 15

## 2020-12-04 SURGICAL SUPPLY — 38 items
BAG COUNTER SPONGE SURGICOUNT (BAG) IMPLANT
BLADE SAW RECIP 87.9 MT (BLADE) ×2 IMPLANT
BLADE SURG 21 STRL SS (BLADE) ×2 IMPLANT
BNDG COHESIVE 6X5 TAN STRL LF (GAUZE/BANDAGES/DRESSINGS) IMPLANT
CANISTER WOUND CARE 500ML ATS (WOUND CARE) ×2 IMPLANT
CANISTER WOUNDNEG PRESSURE 500 (CANNISTER) ×1 IMPLANT
COVER SURGICAL LIGHT HANDLE (MISCELLANEOUS) ×2 IMPLANT
CUFF TOURN SGL QUICK 34 (TOURNIQUET CUFF) ×1
CUFF TRNQT CYL 34X4.125X (TOURNIQUET CUFF) ×1 IMPLANT
DRAPE DERMATAC (DRAPES) ×2 IMPLANT
DRAPE INCISE IOBAN 66X45 STRL (DRAPES) ×2 IMPLANT
DRAPE U-SHAPE 47X51 STRL (DRAPES) ×2 IMPLANT
DRESSING PREVENA PLUS CUSTOM (GAUZE/BANDAGES/DRESSINGS) ×1 IMPLANT
DRSG PREVENA PLUS CUSTOM (GAUZE/BANDAGES/DRESSINGS) ×2
DURAPREP 26ML APPLICATOR (WOUND CARE) ×2 IMPLANT
ELECT REM PT RETURN 9FT ADLT (ELECTROSURGICAL) ×2
ELECTRODE REM PT RTRN 9FT ADLT (ELECTROSURGICAL) ×1 IMPLANT
GLOVE SURG ORTHO LTX SZ9 (GLOVE) ×2 IMPLANT
GLOVE SURG UNDER POLY LF SZ9 (GLOVE) ×2 IMPLANT
GOWN STRL REUS W/ TWL XL LVL3 (GOWN DISPOSABLE) ×2 IMPLANT
GOWN STRL REUS W/TWL XL LVL3 (GOWN DISPOSABLE) ×2
KIT BASIN OR (CUSTOM PROCEDURE TRAY) ×2 IMPLANT
KIT TURNOVER KIT B (KITS) ×2 IMPLANT
MANIFOLD NEPTUNE II (INSTRUMENTS) ×2 IMPLANT
NS IRRIG 1000ML POUR BTL (IV SOLUTION) ×2 IMPLANT
PACK ORTHO EXTREMITY (CUSTOM PROCEDURE TRAY) ×2 IMPLANT
PAD ARMBOARD 7.5X6 YLW CONV (MISCELLANEOUS) ×2 IMPLANT
PREVENA RESTOR ARTHOFORM 46X30 (CANNISTER) ×2 IMPLANT
SPONGE T-LAP 18X18 ~~LOC~~+RFID (SPONGE) IMPLANT
STAPLER VISISTAT 35W (STAPLE) IMPLANT
STOCKINETTE IMPERVIOUS LG (DRAPES) ×2 IMPLANT
SUT ETHILON 2 0 PSLX (SUTURE) IMPLANT
SUT SILK 2 0 (SUTURE) ×1
SUT SILK 2-0 18XBRD TIE 12 (SUTURE) ×1 IMPLANT
SUT VIC AB 1 CTX 27 (SUTURE) ×4 IMPLANT
TOWEL GREEN STERILE (TOWEL DISPOSABLE) ×2 IMPLANT
TUBE CONNECTING 12X1/4 (SUCTIONS) ×2 IMPLANT
YANKAUER SUCT BULB TIP NO VENT (SUCTIONS) ×2 IMPLANT

## 2020-12-04 NOTE — Anesthesia Postprocedure Evaluation (Signed)
Anesthesia Post Note  Patient: Cory [name]  Procedure(s) Performed: RIGHT BELOW KNEE AMPUTATION (Right: Knee) APPLICATION OF WOUND VAC (Right: Knee)     Patient location during evaluation: PACU Anesthesia Type: Regional and General Level of consciousness: awake Pain management: pain level controlled Vital Signs Assessment: post-procedure vital signs reviewed and stable Respiratory status: spontaneous breathing and respiratory function stable Cardiovascular status: stable Postop Assessment: no apparent nausea or vomiting Anesthetic complications: no   No notable events documented.  Last Vitals:  Vitals:   12/04/20 1010 12/04/20 1115  BP: (!) 153/93 139/85  Pulse: 75 77  Resp: 19 16  Temp:  36.8 C  SpO2: 100% 100%    Last Pain:  Vitals:   12/04/20 1115  TempSrc: Oral  PainSc:                  [name]

## 2020-12-04 NOTE — Anesthesia Procedure Notes (Signed)
Procedure Name: LMA Insertion Date/Time: 12/04/2020 8:42 AM Performed by: Carlos American, CRNA Pre-anesthesia Checklist: Patient identified, Emergency Drugs available, Suction available and Patient being monitored Patient Re-evaluated:Patient Re-evaluated prior to induction Oxygen Delivery Method: Circle System Utilized Preoxygenation: Pre-oxygenation with 100% oxygen Induction Type: IV induction Ventilation: Mask ventilation without difficulty LMA: LMA inserted LMA Size: 4.0 Number of attempts: 1 Placement Confirmation: positive ETCO2 Tube secured with: Tape Dental Injury: Teeth and Oropharynx as per pre-operative assessment

## 2020-12-04 NOTE — Interval H&P Note (Signed)
History and Physical Interval Note:  12/04/2020 6:48 AM  Cory [name]  has presented today for surgery, with the diagnosis of Osteomyelitis Right Calcaneous.  The various methods of treatment have been discussed with the patient and family. 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  The patient's history has been reviewed, patient examined, no change in status, stable for surgery.  I have reviewed the patient's chart and labs.  Questions were answered to the patient's satisfaction.     Nadara Mustard

## 2020-12-04 NOTE — Progress Notes (Signed)
Patient on OR schedule at [facility] for 0820, called and spoke to [facility] in Recovery who said to have [facility] bring the patient to short stay around 0600, call placed to [facility] and transfer set up, at this point patient will be returning to [facility] as no bed is available at [facility] currently, consents are being sent with patient as he will not sign until speaking with the doctor again, eras drink given, chg bath done, teeth brushed, pre-op checklist started, patients belongings will stay in his room at [facility] while he goes to surgery at [facility].

## 2020-12-04 NOTE — TOC Initial Note (Signed)
Transition of Care [facility]) - Initial/Assessment Note    Patient Details  Name: Cory [name] MRN: [id number] Date of Birth: 1946-02-24  Transition of [facility]) CM/SW Contact:    Amada Jupiter, LCSW Phone Number: 12/04/2020, 4:10 PM  Clinical Narrative:                 Met with pt and niece, [name], this afternoon to introduce self/ TOC role.  Pt alert and making jokes with [place] in the room.  Pt happy to have the BKA surgery over with. Niece notes (and pt confirms) that she is primary family contact as pt's spouse is currently working in [place].  She and pt are agreed that he will need to dc to SNF to begin rehabilitation and eventually return home.  Have reviewed SNF preferences in the area and will begin bed search once pt has been evaluated by therapies.  Expected Discharge Plan: Skilled Nursing Facility Barriers to Discharge: Continued Medical Work up   Patient Goals and CMS Choice Patient states their goals for this hospitalization and ongoing recovery are:: to eventually return to home after SNF rehab CMS Medicare.gov Compare Post Acute Care list provided to:: Other (Comment Required) (pt's neice)    Expected Discharge Plan and Services Expected Discharge Plan: Skilled Nursing Facility In-house Referral: Clinical Social Work   Post Acute Care Choice: Skilled Nursing Facility Living arrangements for the past 2 months: Single Family Home                 DME Arranged: N/A DME Agency: NA                  Prior Living Arrangements/Services Living arrangements for the past 2 months: Single Family Home Lives with:: Spouse Patient language and need for interpreter reviewed:: Yes Do you feel safe going back to the place where you live?: Yes      Need for Family Participation in Patient Care: No (Comment) Care giver support system in place?: Yes (comment)   Criminal Activity/Legal Involvement Pertinent to Current Situation/Hospitalization: No - Comment as needed  Activities of  Daily Living Home Assistive Devices/Equipment: None ADL Screening (condition at time of admission) Patient's cognitive ability adequate to safely complete daily activities?: Yes Is the patient deaf or have difficulty hearing?: No Does the patient have difficulty seeing, even when wearing glasses/contacts?: No Does the patient have difficulty concentrating, remembering, or making decisions?: No Patient able to express need for assistance with ADLs?: Yes Does the patient have difficulty dressing or bathing?: No Independently performs ADLs?: Yes (appropriate for developmental age) Does the patient have difficulty walking or climbing stairs?: No Weakness of Legs: None Weakness of Arms/Hands: Right  Permission Sought/Granted Permission sought to share information with : Family Supports Permission granted to share information with : Yes, Verbal Permission Granted  Share Information with NAME: Eloisa Northern - Simon     Permission granted to share info w Relationship: neice  Permission granted to share info w Contact Information: [phone]  Emotional Assessment Appearance:: Appears stated age Attitude/Demeanor/Rapport: Charismatic, Gracious, Engaged Affect (typically observed): Accepting, Pleasant Orientation: : Oriented to Place, Oriented to Self, Oriented to  Time, Oriented to Situation Alcohol / Substance Use: Not Applicable Psych Involvement: No (comment)  Admission diagnosis:  Osteomyelitis of right foot (HCC) [M86.9] Osteomyelitis of right foot, unspecified type (HCC) [M86.9] 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) [E11.65] Patient Active Problem List   Diagnosis Date Noted   Moderate protein-calorie malnutrition (HCC)    Sepsis (  HCC) 11/27/2020   Overweight (BMI 25.0-29.9) 11/27/2020   Hyperlipidemia 11/26/2020   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) 11/26/2020   Hypertension 11/26/2020   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   PCP:  Frederich Chick.,  MD Pharmacy:   [facility] #[id number] - [place], [place] - [address] AT [facility] OF [facility] & [address] [place] [address] Phone: [phone] Fax: [phone]  [facility] #[id number] [place], [place] - [address] AT [facility] OF [address] & [address] [place] [address] Phone: [phone] Fax: [phone]  [facility] #[id number] [place],  - [address] AT [facility] OF [facility] & [address] [place] [address] Phone: [phone] Fax: [phone]     Social Determinants of Health (SDOH) Interventions    Readmission Risk Interventions No flowsheet data found.

## 2020-12-04 NOTE — Plan of Care (Signed)
  Problem: Pain Managment: Goal: General experience of comfort will improve Outcome: Progressing   

## 2020-12-04 NOTE — Plan of Care (Signed)

## 2020-12-04 NOTE — Anesthesia Procedure Notes (Signed)
Anesthesia Regional Block: Popliteal block   Pre-Anesthetic Checklist: , timeout performed,  Correct Patient, Correct Site, Correct Laterality,  Correct Procedure, Correct Position, site marked,  Risks and benefits discussed,  Surgical consent,  Pre-op evaluation,  At surgeon's request and post-op pain management  Laterality: Right  Prep: chloraprep       Needles:  Injection technique: Single-shot  Needle Type: Echogenic Stimulator Needle     Needle Length: 10cm  Needle Gauge: 20     Additional Needles:   Narrative:  Start time: 12/04/2020 8:05 AM End time: 12/04/2020 8:10 AM Injection made incrementally with aspirations every 5 mL.  Performed by: Personally  Anesthesiologist: [name], MD  Additional Notes: A functioning IV was confirmed and monitors were applied.  Sterile prep and drape, hand hygiene and sterile gloves were used.  Negative aspiration and test dose prior to incremental administration of local anesthetic. The patient tolerated the procedure well.Ultrasound  guidance: relevant anatomy identified, needle position confirmed, local anesthetic spread visualized around nerve(s), vascular puncture avoided.  Image printed for medical record.

## 2020-12-04 NOTE — Progress Notes (Signed)
Patient transported via [facility] to [facility] short stay for surgical procedure with Dr. Lajoyce Corners this morning. Attempted to call report but was unable to reach anyone in short stay at this time. Patient took a labeled belongings bag with him that contained his phone, Consulting civil engineer, and eyeglasses. Patient had a bowel movement just before leaving and was cleaned and had a new bed pad placed underneath. Condom cath intact and bag was emptied for 1000 cc's of clear yellow urine. VSS. Patient denies pain at this time. No acute distress noted. Consents sent with chart in envelope.

## 2020-12-04 NOTE — Progress Notes (Signed)
Nutrition Follow-up  INTERVENTION:   -Prosource Plus PO BID, each provides 100 kcals and 15g protein  -Ensure MAX Protein po BID, each supplement provides 150 kcal and 30 grams of protein   -Multivitamin with minerals daily  NUTRITION DIAGNOSIS:   Increased nutrient needs related to wound healing as evidenced by estimated needs.  Ongoing.  GOAL:   Patient will meet greater than or equal to 90% of their needs  Progressing.  MONITOR:   PO intake, Supplement acceptance, Diet advancement, Labs, Weight trends, I & O's, Skin  ASSESSMENT:   75 year old male with past medical history of poorly controlled diabetes mellitus, hypertension and hyperlipidemia who developed issues with some right ankle and foot pain with infection a month ago with symptoms persisting despite doses of antibiotics, x-rays, DVT ruled out and an unsuccessful ankle arthrocentesis on 7/17.  Presented to the ED on the evening of 7/21 with persistent complaints and found to have sepsis.  MRI of right foot/ankle revealed osteomyelitis.  Patient currently in OR for right BKA and wound VAC. Updated estimated needs below.  Currently NPO. Pt was refusing most meals yesterday. Prior to that pt was consuming 40-50% of meals. Accepting protein supplements.  Admission weight: 210 lbs. Last recorded weight today but it is taken from 7/21. Not measured.  Per nursing documentation, pt with severe RLE edema.  Medications reviewed.  Labs reviewed: CBGs: 75-181 Low K  Diet Order:   Diet Order             Diet NPO time specified  Diet effective midnight                   EDUCATION NEEDS:   Not appropriate for education at this time  Skin:  Skin Assessment: Reviewed RN Assessment  Last BM:  7/29 -type 5&6  Height:   Ht Readings from Last 1 Encounters:  12/04/20 5\' 11"  (1.803 m)    Weight:   Wt Readings from Last 1 Encounters:  12/04/20 95.3 kg    BMI:  Body mass index is 29.29  kg/m.  Estimated Nutritional Needs:   Kcal:  12/06/20  Protein:  110-125g  Fluid:  >2L/day   [id number], MS, RD, LDN Inpatient Clinical Dietitian Contact information available via Amion

## 2020-12-04 NOTE — Transfer of Care (Signed)
Immediate Anesthesia Transfer of Care Note  Patient: Cory [name]  Procedure(s) Performed: RIGHT BELOW KNEE AMPUTATION (Right: Knee) APPLICATION OF WOUND VAC (Right: Knee)  Patient Location: PACU  Anesthesia Type:GA combined with regional for post-op pain  Level of Consciousness: awake, alert  and patient cooperative  Airway & Oxygen Therapy: Patient Spontanous Breathing  Post-op Assessment: Report given to RN and Post -op Vital signs reviewed and stable  Post vital signs: Reviewed and stable  Last Vitals:  Vitals Value Taken Time  BP 168/105 12/04/20 0923  Temp    Pulse 72 12/04/20 0925  Resp 16 12/04/20 0925  SpO2 97 % 12/04/20 0925  Vitals shown include unvalidated device data.  Last Pain:  Vitals:   12/04/20 0820  TempSrc:   PainSc: 0-No pain      Patients Stated Pain Goal: 3 (12/04/20 0810)  Complications: No notable events documented.

## 2020-12-04 NOTE — Progress Notes (Signed)
PROGRESS NOTE    Cory [name]  RJJ:[id number] DOB: 01-Nov-1945 DOA: 11/26/2020 PCP: Frederich Chick., MD    Brief Narrative:  Cory [name] is a 75 year old male with past medical history significant for type 2 diabetes mellitus, essential hypertension, hyperlipidemia who presented to [facility] ED on 7/21 with progressive ankle/foot pain.  Onset roughly 1 month ago with multiple ED visits with outpatient courses of Keflex and Bactrim.  Now with difficulty bearing weight on right foot.  MRI notable for right foot osteomyelitis.  Recent ABIs showed good arterial flow in the distal lower extremities.  Patient was started on IV antibiotics and admitted to the hospitalist service for further evaluation and treatment.  Orthopedics was consulted and plan for operative management.   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  On arrival, patient with tachycardia, leukocytosis.  MR right ankle with and without contrast with soft tissue irregularity posterior lateral aspect ankle with extensive ill-defined fluid within soft tissues consistent with cellulitis; acute osteomyelitis involving the lateral aspect of the calcaneal body with diffuse intramuscular edema consistent with myositis. Recent ABIs showed good arterial flow in the distal lower extremities.  Patient was seen initially by orthopedics, Dr. Lajoyce Corners who recommended right BKA.  Patient and family requested second opinion, and was seen by Dr. Everardo Pacific on 7/27 with recommendation that surgical debridement or treatment from a wound care  center would not be sufficient due to the severity of his wound and bone infection in which she is at risk for worsening infection and sepsis and recommended surgical intervention with Dr. Lajoyce Corners.  Patient underwent right BKA by Dr. Lajoyce Corners on 12/04/2020. --Orthopedics following, Dr. Lajoyce Corners; appreciate assistance --CRP 19.6 --ESR 81 --Blood cultures x2 7/21: No growth x5 days --Ceftriaxone 2 g IV q24h and Zyvox 600 mg IV q12h; continue for 24-48 hours postop then can stop --Nonweightbearing right lower extremity --Continue wound VAC x1 week --Pending PT/OT evaluation, may need SNF  Acute renal failure, not POA Etiology likely multifactorial with poor oral intake, medications to include IV antibiotics. --Cr 0.76>>1.64>1.24>0.95>1.07>0.96 --IV antibiotics changed from vancomycin/Zosyn to Zyvox and ceftriaxone --Holding home losartan --Repeat BMP in a.m.  Hyponatremia Sodium down to 128, suspect volume depletion with poor oral intake. --s/p IVF hydration with Na now up to 135 --Repeat BMP in the a.m.  Type 2 diabetes mellitus, with hyperglycemia Hemoglobin A1c 12.1, poorly controlled.  Patient reports inconsistencies with home medication regimen.  On Trulicity, metformin, Lantus 45 units subcutaneously daily at home.  Suspect dietary indiscretions. --Diabetic educator following, appreciate assistance --Lantus 50 units subcutaneously qHS, Novolog 4u TIDAC --SSI for further coverage --CBG before every meal/at bedtime  Essential hypertension --Metoprolol succinate 100 mg p.o. daily --Nifedipine 60 mg p.o. daily --Holding home losartan secondary to AKI as above --Hydralazine as needed  Hyperlipidemia: Continue atorvastatin 20 mg p.o. daily  BPH: Tamsulosin 0.4 mg p.o. nightly    DVT prophylaxis: SCD's Start: 12/04/20 1114 Place and maintain sequential compression device Start: 11/27/20 1919   Code Status: Full Code Family Communication: No family present at bedside this morning, updated  patient's niece via telephone yesterday morning  Disposition Plan:  Level of care: Med-Surg Status is: Inpatient  Remains inpatient appropriate because:Ongoing diagnostic testing needed not appropriate for outpatient work up, IV treatments appropriate due to intensity of illness or inability to take PO, and Inpatient level of care appropriate due to severity of illness  Dispo: The patient is from: Home              Anticipated d/c is to: Home              Patient currently is not medically stable to d/c.   Difficult to place patient No   Consultants:  Orthopedics, Dr. Lajoyce Corners  Procedures:  None  Antimicrobials:  Vancomycin 7/21 - 7/24 Zosyn 7/22 - 7/24 Ceftriaxone 7/22 - 7/22; 7/27>> Flagyl 7/22 - 7/22 Zyvox 7/24>> Cefepime 7/21, 7/24 - 7/27   Subjective: Patient seen examined bedside, resting comfortably.  Patient initially transferred earlier this morning to [facility] for surgical intervention with right BKA by Dr. Lajoyce Corners, now returns to [place] long this afternoon.  Wound VAC in place.  Awaiting PT/OT evaluation.  Patient in good spirits this afternoon, ordering lunch.  No family present, no complaints or concerns at this time.  Denies headache, no fever/chills/night sweats, no nausea/vomiting/diarrhea, no chest pain, no palpitations, no shortness of breath, no abdominal pain.  No acute concerns overnight per nursing staff.  Objective: Vitals:   12/04/20 1005 12/04/20 1010 12/04/20 1115 12/04/20 1328  BP: (!) 149/93 (!) 153/93 139/85 (!) 165/93  Pulse: 76 75 77 76  Resp: 20 19 16 18   Temp:   98.2 F (36.8 C) 98.2 F (36.8 C)  TempSrc:   Oral Oral  SpO2: 96% 100% 100% 95%  Weight:      Height:        Intake/Output Summary (Last 24 hours) at 12/04/2020 1338 Last data filed at 12/04/2020 1300 Gross per 24 hour  Intake 2143.27 ml  Output 3900 ml  Net -1756.73 ml   Filed Weights   11/26/20 1825 12/04/20 0638  Weight: 95.3 kg 95.3 kg    Examination:  General exam:  Appears calm and comfortable  Respiratory system: Clear to auscultation. Respiratory effort normal.  On room air Cardiovascular system: S1 & S2 heard, RRR. No JVD, murmurs, rubs, gallops or clicks. No pedal edema. Gastrointestinal system: Abdomen is nondistended, soft and nontender. No organomegaly or masses felt. Normal bowel sounds heard. Central nervous system: Alert and oriented. No focal neurological deficits. Extremities: Moves all extremities independently with intact muscle strength, now noted right BKA with wound VAC in place. Skin: no rashes, lesions or ulcers Psychiatry: Judgement and insight appear normal. Mood & affect appropriate.     Data Reviewed: I have personally reviewed following labs and imaging studies  CBC: Recent Labs  Lab 11/27/20 1958 11/28/20 0432 11/29/20 0312 12/01/20 0329 12/04/20 0451  WBC 12.7* 11.9* 11.3* 9.6 5.7  HGB 12.8* 12.2* 11.9* 11.3* 11.0*  HCT 38.4* 36.3* 35.6* 33.7* 32.6*  MCV 81.7 80.5 81.8 80.8 80.9  PLT 241 226 230 226 205   Basic Metabolic Panel: Recent Labs  Lab 11/30/20 1300 12/01/20 0329 12/02/20 0346 12/03/20 0254 12/04/20 0451  NA 133* 135 138 135 135  K 4.0 3.2* 3.6 3.3* 3.2*  CL 97* 103 102 100 100  CO2 27 25 26 27 27   GLUCOSE 240* 183* 163* 252* 111*  BUN 21 15 13 13 10   CREATININE 1.36* 1.24 0.95 1.07 0.96  CALCIUM 8.4* 8.3* 8.3* 8.0* 7.9*  MG  --   --  2.0  --  1.8   GFR: Estimated Creatinine Clearance: 80.7 mL/min (by C-G formula based on SCr of 0.96 mg/dL). Liver Function Tests: Recent Labs  Lab 11/28/20 0432  AST 75*  ALT 36  ALKPHOS 70  BILITOT 0.9  PROT 7.0  ALBUMIN 2.6*   No results for input(s): LIPASE, AMYLASE in the last 168 hours. No results for input(s): AMMONIA in the last 168 hours. Coagulation Profile: No results for input(s): INR, PROTIME in the last 168 hours.  Cardiac Enzymes: No results for input(s): CKTOTAL, CKMB, CKMBINDEX, TROPONINI in the last 168 hours. BNP (last 3  results) No results for input(s): PROBNP in the last 8760 hours. HbA1C: No results for input(s): HGBA1C in the last 72 hours. CBG: Recent Labs  Lab 12/03/20 2355 12/04/20 0333 12/04/20 0640 12/04/20 0923 12/04/20 1128  GLUCAP 181* 127* 98 75 105*   Lipid Profile: No results for input(s): CHOL, HDL, LDLCALC, TRIG, CHOLHDL, LDLDIRECT in the last 72 hours. Thyroid Function Tests: No results for input(s): TSH, T4TOTAL, FREET4, T3FREE, THYROIDAB in the last 72 hours.  Anemia Panel: No results for input(s): VITAMINB12, FOLATE, FERRITIN, TIBC, IRON, RETICCTPCT in the last 72 hours. Sepsis Labs: No results for input(s): PROCALCITON, LATICACIDVEN in the last 168 hours.   Recent Results (from the past 240 hour(s))  Culture, blood (Routine x 2)     Status: None   Collection Time: 11/26/20  6:39 PM   Specimen: BLOOD LEFT HAND  Result Value Ref Range Status   Specimen Description   Final    BLOOD LEFT HAND Performed at [facility], [address] [place], [place] [address]    Special Requests   Final    BOTTLES DRAWN AEROBIC AND ANAEROBIC Blood Culture adequate volume Performed at [facility], [address] [place], [place] [address]    Culture   Final    NO GROWTH 5 DAYS Performed at [facility], [address] [place], [place] [address]    Report Status 12/01/2020 FINAL  Final  Culture, blood (Routine x 2)     Status: None   Collection Time: 11/26/20  6:44 PM   Specimen: BLOOD  Result Value Ref Range Status   Specimen Description   Final    BLOOD BLOOD RIGHT HAND Performed at [facility], [address], [place] , [place] [address]    Special Requests   Final    BOTTLES DRAWN AEROBIC AND ANAEROBIC Blood Culture adequate volume Performed at [facility], [address], [place], [place] [address]    Culture   Final    NO GROWTH 5 DAYS Performed at [facility], [address]  [place], [place] [address]    Report Status 12/01/2020 FINAL  Final  Resp Panel by RT-PCR (Flu A&B, Covid) Nasopharyngeal Swab     Status: None   Collection Time: 11/26/20  7:00 PM   Specimen: Nasopharyngeal Swab; Nasopharyngeal(NP) swabs in vial transport medium  Result Value Ref Range Status   SARS Coronavirus 2 by RT PCR NEGATIVE NEGATIVE Final    Comment: (NOTE) SARS-CoV-2 target nucleic acids are NOT DETECTED.  The SARS-CoV-2 RNA is generally detectable in upper respiratory specimens during the acute phase of infection. The lowest concentration of SARS-CoV-2 viral copies this assay can detect is 138 copies/mL. A negative result does not preclude SARS-Cov-2 infection and should not be used as the sole basis for treatment or other patient management decisions. A negative  result may occur with  improper specimen collection/handling, submission of specimen other than nasopharyngeal swab, presence of viral mutation(s) within the areas targeted by this assay, and inadequate number of viral copies(<138 copies/mL). A negative result must be combined with clinical observations, patient history, and epidemiological information. The expected result is Negative.  Fact Sheet for Patients:  BloggerCourse.com  Fact Sheet for Healthcare Providers:  SeriousBroker.it  This test is no t yet approved or cleared by the [place] FDA and  has been authorized for detection and/or diagnosis of SARS-CoV-2 by FDA under an Emergency Use Authorization (EUA). This EUA will remain  in effect (meaning this test can be used) for the duration of the COVID-19 declaration under Section 564(b)(1) of the Act, 21 U.S.C.section 360bbb-3(b)(1), unless the authorization is terminated  or revoked sooner.       Influenza A by PCR NEGATIVE NEGATIVE Final   Influenza B by PCR NEGATIVE NEGATIVE Final    Comment: (NOTE) The Xpert Xpress SARS-CoV-2/FLU/RSV plus assay is  intended as an aid in the diagnosis of influenza from Nasopharyngeal swab specimens and should not be used as a sole basis for treatment. Nasal washings and aspirates are unacceptable for Xpert Xpress SARS-CoV-2/FLU/RSV testing.  Fact Sheet for Patients: BloggerCourse.com  Fact Sheet for Healthcare Providers: SeriousBroker.it  This test is not yet approved or cleared by the [place] FDA and has been authorized for detection and/or diagnosis of SARS-CoV-2 by FDA under an Emergency Use Authorization (EUA). This EUA will remain in effect (meaning this test can be used) for the duration of the COVID-19 declaration under Section 564(b)(1) of the Act, 21 U.S.C. section 360bbb-3(b)(1), unless the authorization is terminated or revoked.  Performed at [facility], [address] [place], [place] [address]   MRSA Next Gen by PCR, Nasal     Status: Abnormal   Collection Time: 11/27/20  2:08 AM   Specimen: Nasal Mucosa; Nasal Swab  Result Value Ref Range Status   MRSA by PCR Next Gen DETECTED (A) NOT DETECTED Final    Comment: RESULT CALLED TO, READ BACK BY AND VERIFIED WITH: DANIEL K. ON 11/27/2020 @ 0717 BY MECIAL J. (NOTE) The GeneXpert MRSA Assay (FDA approved for NASAL specimens only), is one component of a comprehensive MRSA colonization surveillance program. It is not intended to diagnose MRSA infection nor to guide or monitor treatment for MRSA infections. Test performance is not FDA approved in patients less than 56 years old. Performed at [facility], [address], [place], [place] [address]   Aerobic/Anaerobic Culture w Gram Stain (surgical/deep wound)     Status: None   Collection Time: 11/27/20  4:58 AM   Specimen: Wound  Result Value Ref Range Status   Specimen Description   Final    WOUND RT FOOT Performed at [facility], [address]  [place], [place] [address]    Special Requests   Final    NONE Performed at [facility], [address] [place], [place] [address]    Gram Stain NO WBC SEEN NO ORGANISMS SEEN   Final   Culture   Final    MODERATE ESCHERICHIA COLI NO ANAEROBES ISOLATED Performed at [facility], [address] [place], [place] [address]    Report Status 12/03/2020 FINAL  Final   Organism ID, Bacteria ESCHERICHIA COLI  Final      Susceptibility   Escherichia coli - MIC*    AMPICILLIN >=32 RESISTANT  Resistant     CEFAZOLIN <=4 SENSITIVE Sensitive     CEFEPIME <=0.12 SENSITIVE Sensitive     CEFTAZIDIME <=1 SENSITIVE Sensitive     CEFTRIAXONE <=0.25 SENSITIVE Sensitive     CIPROFLOXACIN 0.5 SENSITIVE Sensitive     GENTAMICIN <=1 SENSITIVE Sensitive     IMIPENEM <=0.25 SENSITIVE Sensitive     TRIMETH/SULFA <=20 SENSITIVE Sensitive     AMPICILLIN/SULBACTAM >=32 RESISTANT Resistant     PIP/TAZO <=4 SENSITIVE Sensitive     * MODERATE ESCHERICHIA COLI         Radiology Studies: No results found.      Scheduled Meds:  (feeding supplement) PROSource Plus  30 mL Oral BID WC   vitamin C  1,000 mg Oral Daily   atorvastatin  20 mg Oral QHS    ceFAZolin (ANCEF) IV  2 g Intravenous Q8H   Chlorhexidine Gluconate Cloth  6 each Topical Q0600   [START ON 12/05/2020] docusate sodium  100 mg Oral Daily   hydrALAZINE       insulin aspart  0-9 Units Subcutaneous Q4H   insulin aspart  4 Units Subcutaneous TID WC   insulin glargine  50 Units Subcutaneous QHS   metoprolol succinate  100 mg Oral Daily   multivitamin with minerals  1 tablet Oral Daily   mupirocin ointment  1 application Nasal BID   NIFEdipine  60 mg Oral Daily   nutrition supplement (JUVEN)  1 packet Oral BID BM   pantoprazole  40 mg Oral Daily   Ensure Max Protein  11 oz Oral BID   tamsulosin  0.4 mg Oral QHS   zinc sulfate  220 mg Oral Daily   Continuous Infusions:  sodium chloride Stopped (12/04/20 0532)    sodium chloride 75 mL/hr at 12/04/20 1300   cefTRIAXone (ROCEPHIN)  IV 2 g (12/03/20 2229)   linezolid (ZYVOX) IV 600 mg (12/03/20 2230)   magnesium sulfate bolus IVPB       LOS: 8 days    Time spent: 37 minutes spent on chart review, discussion with nursing staff, consultants, updating family and interview/physical exam; more than 50% of that time was spent in counseling and/or coordination of care.    Alvira Philips [place], DO [facility] Available via Epic secure chat 7am-7pm After these hours, please refer to coverage provider listed on [email or web address] 12/04/2020, 1:38 PM

## 2020-12-04 NOTE — Op Note (Signed)
   Date of Surgery: 12/04/2020  INDICATIONS: Cory [name] is a 75 y.o.-year-old male who presents with osteomyelitis of the calcaneus with a necrotic heel ulcer.Marland Kitchen  PREOPERATIVE DIAGNOSIS: Osteomyelitis right heel with ulceration  POSTOPERATIVE DIAGNOSIS: Same.  PROCEDURE: Transtibial amputation Application of Prevena wound VAC  SURGEON: Lajoyce Corners, M.D.  ANESTHESIA:  general  IV FLUIDS AND URINE: See anesthesia records.  ESTIMATED BLOOD LOSS: See anesthesia records.  COMPLICATIONS: None.  DESCRIPTION OF PROCEDURE: The patient was brought to the operating room after undergoing regional anesthetic. After adequate levels of anesthesia were obtained patient's lower extremity was prepped using DuraPrep draped into a sterile field. A timeout was called. The foot was draped out of the sterile field with impervious stockinette. A transverse incision was made 11 cm distal to the tibial tubercle. This curved proximally and a large posterior flap was created. The tibia was transected 1 cm proximal to the skin incision. The fibula was transected just proximal to the tibial incision. The tibia was beveled anteriorly. A large posterior flap was created. The sciatic nerve was pulled cut and allowed to retract. The vascular bundles were suture ligated with 2-0 silk. The deep and superficial fascial layers were closed using #1 Vicryl. The skin was closed using staples and 2-0 nylon. The wound was covered with a Prevena customizable and arthroform wound VAC.  The dressing was sealed with dermatac there was a good suction fit. A prosthetic shrinker and limb protector were applied. Patient was taken to the PACU in stable condition.   DISCHARGE PLANNING:  Antibiotic duration: 24 hours  Weightbearing: Nonweightbearing on the operative extremity  Pain medication: Opioid pathway  Dressing care/ Wound VAC: Continue wound VAC for 1 week after discharge  Discharge to: Discharge planning based on therapy's  recommendations for possible inpatient rehabilitation, outpatient rehabilitation, or discharge to home with therapy  Follow-up: In the office 1 week post operative.  Cory Baker, MD [facility] 9:23 AM

## 2020-12-05 ENCOUNTER — Encounter (HOSPITAL_COMMUNITY): Payer: Self-pay | Admitting: Orthopedic Surgery

## 2020-12-05 DIAGNOSIS — E876 Hypokalemia: Secondary | ICD-10-CM

## 2020-12-05 DIAGNOSIS — E1165 Type 2 diabetes mellitus with hyperglycemia: Secondary | ICD-10-CM | POA: Diagnosis not present

## 2020-12-05 DIAGNOSIS — A419 Sepsis, unspecified organism: Secondary | ICD-10-CM | POA: Diagnosis not present

## 2020-12-05 DIAGNOSIS — M86271 Subacute osteomyelitis, right ankle and foot: Secondary | ICD-10-CM | POA: Diagnosis not present

## 2020-12-05 DIAGNOSIS — M60073 Infective myositis, right foot: Secondary | ICD-10-CM

## 2020-12-05 DIAGNOSIS — I10 Essential (primary) hypertension: Secondary | ICD-10-CM | POA: Diagnosis not present

## 2020-12-05 DIAGNOSIS — L03115 Cellulitis of right lower limb: Secondary | ICD-10-CM

## 2020-12-05 DIAGNOSIS — E1169 Type 2 diabetes mellitus with other specified complication: Secondary | ICD-10-CM

## 2020-12-05 LAB — CBC
HCT: 33.1 % — ABNORMAL LOW (ref 39.0–52.0)
Hemoglobin: 11 g/dL — ABNORMAL LOW (ref 13.0–17.0)
MCH: 27 pg (ref 26.0–34.0)
MCHC: 33.2 g/dL (ref 30.0–36.0)
MCV: 81.3 fL (ref 80.0–100.0)
Platelets: 205 10*3/uL (ref 150–400)
RBC: 4.07 MIL/uL — ABNORMAL LOW (ref 4.22–5.81)
RDW: 12.3 % (ref 11.5–15.5)
WBC: 6.4 10*3/uL (ref 4.0–10.5)
nRBC: 0 % (ref 0.0–0.2)

## 2020-12-05 LAB — GLUCOSE, CAPILLARY
Glucose-Capillary: 127 mg/dL — ABNORMAL HIGH (ref 70–99)
Glucose-Capillary: 70 mg/dL (ref 70–99)
Glucose-Capillary: 163 mg/dL — ABNORMAL HIGH (ref 70–99)
Glucose-Capillary: 193 mg/dL — ABNORMAL HIGH (ref 70–99)
Glucose-Capillary: 127 mg/dL — ABNORMAL HIGH (ref 70–99)

## 2020-12-05 LAB — MAGNESIUM: Magnesium: 1.8 mg/dL (ref 1.7–2.4)

## 2020-12-05 LAB — BASIC METABOLIC PANEL
Anion gap: 9 (ref 5–15)
BUN: 14 mg/dL (ref 8–23)
CO2: 26 mmol/L (ref 22–32)
Calcium: 7.9 mg/dL — ABNORMAL LOW (ref 8.9–10.3)
Chloride: 100 mmol/L (ref 98–111)
Creatinine, Ser: 0.74 mg/dL (ref 0.61–1.24)
GFR, Estimated: >60 mL/min (ref >60)
Glucose, Bld: 154 mg/dL — ABNORMAL HIGH (ref 70–99)
Potassium: 3.2 mmol/L — ABNORMAL LOW (ref 3.5–5.1)
Sodium: 135 mmol/L (ref 135–145)

## 2020-12-05 MED ORDER — LABETALOL HCL 5 MG/ML IV SOLN
10.0000 mg | INTRAVENOUS | Status: DC | PRN
  Administered 2020-12-05: 10 mg via INTRAVENOUS
  Filled 2020-12-05: qty 4

## 2020-12-05 MED ORDER — INSULIN ASPART 100 UNIT/ML IJ SOLN: 0.0000 [IU] | Freq: Every day | INTRAMUSCULAR | Status: DC

## 2020-12-05 MED ORDER — INSULIN ASPART 100 UNIT/ML IJ SOLN
0.0000 [IU] | Freq: Three times a day (TID) | INTRAMUSCULAR | Status: DC
  Administered 2020-12-05 – 2020-12-06 (×2): 3 [IU] via SUBCUTANEOUS
  Administered 2020-12-06: 11 [IU] via SUBCUTANEOUS
  Administered 2020-12-07: 5 [IU] via SUBCUTANEOUS
  Administered 2020-12-07: 3 [IU] via SUBCUTANEOUS
  Administered 2020-12-08: 5 [IU] via SUBCUTANEOUS
  Administered 2020-12-08: 3 [IU] via SUBCUTANEOUS

## 2020-12-05 MED ORDER — INSULIN GLARGINE-YFGN 100 UNIT/ML ~~LOC~~ SOLN
25.0000 [IU] | Freq: Two times a day (BID) | SUBCUTANEOUS | Status: DC
  Administered 2020-12-05 – 2020-12-08 (×6): 25 [IU] via SUBCUTANEOUS
  Filled 2020-12-05 (×7): qty 0.25

## 2020-12-05 MED ORDER — POTASSIUM CHLORIDE CRYS ER 20 MEQ PO TBCR
40.0000 meq | EXTENDED_RELEASE_TABLET | ORAL | Status: AC
Start: 2020-12-05 — End: 2020-12-05
  Administered 2020-12-05 (×2): 40 meq via ORAL
  Filled 2020-12-05 (×2): qty 2

## 2020-12-05 MED ORDER — INSULIN ASPART 100 UNIT/ML IJ SOLN
4.0000 [IU] | Freq: Three times a day (TID) | INTRAMUSCULAR | Status: DC
  Administered 2020-12-05 – 2020-12-08 (×7): 4 [IU] via SUBCUTANEOUS

## 2020-12-05 MED ORDER — LOSARTAN POTASSIUM 50 MG PO TABS
50.0000 mg | ORAL_TABLET | Freq: Every day | ORAL | Status: DC
  Administered 2020-12-05: 50 mg via ORAL
  Filled 2020-12-05: qty 1

## 2020-12-05 NOTE — Evaluation (Signed)
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  Pt with hx of DM2, HTN, HLD, and R foot wound for ~1 month.    Clinical Impression   Cory [name] is a 75 year old man s/p R BKA who presents with pain, decreased ROM and strength of RLE, decreased activity tolerance and impaired balance resulting in a sudden decline in functional abilities. Patient limited by pain in residual limb and movement required +2 for safety but overall min assist. Increased assistance for LB ADLS and toileting and needing seated position for UB ADLs. Patient will benefit from skilled OT services while in hospital to improve deficits and learn compensatory strategies as needed in order to return to PLOF.      Follow Up Recommendations  SNF    Equipment Recommendations  None recommended by OT    Recommendations for Other Services       Precautions / Restrictions Precautions Precautions: Fall Required Braces or Orthoses: Other Brace Other Brace: Residual LImb protector Restrictions Weight Bearing Restrictions: Yes RLE Weight Bearing: Non weight bearing      Mobility Bed Mobility Overal bed mobility: Needs Assistance Bed Mobility: Supine to Sit     Supine to sit: HOB elevated;Min assist;+2 for safety/equipment     General bed mobility comments: Had assist of 2 for pain control and managing limb.  Pt requiring cues for safety and transfer technique with min A for R LE and to lift trunk    Transfers Overall transfer level: Needs assistance Equipment used: Rolling walker (2 wheeled) Transfers: Sit to/from [employer] Sit to Stand: Min assist;+2 safety/equipment;From elevated surface         General transfer comment: Performed sit to stand  x 2 from bed with min A of 2 to rise for safety. CUes for hand placement.  With pivot educated on RW placement and RW use.    Balance Overall balance assessment: Needs assistance Sitting-balance support: No upper extremity supported Sitting balance-Leahy Scale: Good     Standing balance support: Bilateral upper extremity supported Standing balance-Leahy Scale: Poor Standing balance comment: requiring RW                           ADL either performed or assessed with clinical judgement   ADL Overall ADL's : Needs assistance/impaired Eating/Feeding: Independent   Grooming: Set up;Sitting   Upper Body Bathing: Set up;Sitting   Lower Body Bathing: Moderate assistance;Sitting/lateral leans   Upper Body Dressing : Set up;Sitting   Lower Body Dressing: Moderate assistance;Sitting/lateral leans   Toilet Transfer: +2 for safety/equipment;Minimal assistance;BSC;RW Toilet Transfer Details (indicate cue type and reason): using walker and sliding foot to pivot Toileting- Clothing Manipulation and Hygiene: Maximal assistance;Sitting/lateral lean       Functional mobility during ADLs: Minimal assistance;Rolling walker       Vision Patient Visual Report: No change from baseline       Perception     Praxis      Pertinent Vitals/Pain Pain Assessment: Faces Faces Pain Scale: Hurts whole lot Pain Location: R residual limb but reports more phantom pain in foot with movement Pain Descriptors / Indicators: Sharp;Guarding;Grimacing Pain Intervention(s): Limited activity within patient's tolerance     Hand Dominance  Extremity/Trunk Assessment Upper Extremity Assessment Upper Extremity Assessment: RUE deficits/detail;LUE deficits/detail RUE Deficits / Details: WNL ROM, 5/5 strength RUE Sensation: WNL RUE Coordination: WNL LUE Deficits / Details: WNL ROM, 5/5 strength LUE Sensation: WNL LUE Coordination: WNL   Lower Extremity Assessment Lower Extremity  Assessment: Defer to PT evaluation RLE Deficits / Details: Limited testing due to pain.  Attempted to remove residual limb protector to assess leg and even with gentle movements/assist of 2, pt was unable to tolerate and grabbing therapist wrist to stop due to pain. Left residual limb protector in place, but when loosened noticed pt with tendency to have some knee flexion ~10-15 degrees. Educated on importance of getting full knee ext for future prosthetic and pt was able to demonstrate quad set with cues. Encouraged to perform quad sets.  R hip ROM was [facility] and strength was also 1/5 to assist with transfers. Pt tending to keep hip ER but corrected with cues. LLE Deficits / Details: WFL   Cervical / Trunk Assessment Cervical / Trunk Assessment: Normal   Communication Communication Communication: No difficulties   Cognition Arousal/Alertness: Awake/alert Behavior During Therapy: WFL for tasks assessed/performed Overall Cognitive Status: Within Functional Limits for tasks assessed                                     General Comments       Exercises Amputee Exercises Quad Sets: AROM;5 reps;Right;Seated   Shoulder Instructions      Home Living Family/patient expects to be discharged to:: Skilled nursing facility Living Arrangements: Other (Comment) (with sister) Available Help at Discharge: Family;Available PRN/intermittently Type of Home: House Home Access: Stairs to enter Entrance Stairs-Number of Steps: 3   Home Layout: Two level;Bed/bath upstairs Alternate Level Stairs-Number of Steps: flight             Home Equipment: None          Prior Functioning/Environment Level of Independence: Needs assistance  Gait / Transfers Assistance Needed: Reports independent without AD ADL's / Homemaking Assistance Needed: Reports independent with ADLs/IADLs   Comments: Prior to May when his foot wounds/issues he was independent - reports played soccer and liked to  dance        OT Problem List: Decreased activity tolerance;Impaired balance (sitting and/or standing);Pain;Decreased knowledge of use of DME or AE;Decreased knowledge of precautions      OT Treatment/Interventions: Self-care/ADL training;Therapeutic exercise;DME and/or AE instruction;Patient/family education;Balance training;Therapeutic activities    OT Goals(Current goals can be found in the care plan section) Acute Rehab OT Goals Patient Stated Goal: hop to bathroom OT Goal Formulation: With patient Time For Goal Achievement: 12/19/20 Potential to Achieve Goals: Good  OT Frequency: Min 2X/week   Barriers to D/C:            Co-evaluation PT/OT/SLP Co-Evaluation/Treatment: Yes Reason for Co-Treatment: For patient/therapist safety;Other (comment) (pain) PT goals addressed during session: Mobility/safety with mobility        AM-PAC OT "6 Clicks" Daily Activity     Outcome Measure Help from another person eating meals?: None Help from another person taking care of personal grooming?: A Little Help from another person toileting, which includes using toliet, bedpan, or urinal?: A Lot Help from another person bathing (including washing, rinsing, drying)?: A Little Help from another person to put on and taking off regular upper body clothing?: A Little Help from another person to put on and taking off  regular lower body clothing?: A Lot 6 Click Score: 17   End of Session Equipment Utilized During Treatment: Gait belt;Rolling walker Nurse Communication: Mobility status  Activity Tolerance: Patient limited by pain Patient left: in chair;with call bell/phone within reach;with chair alarm set  OT Visit Diagnosis: Unsteadiness on feet (R26.81);Other abnormalities of gait and mobility (R26.89);Pain                Time: 1328-1401 OT Time Calculation (min): 33 min Charges:  OT General Charges $OT Visit: 1 Visit OT Evaluation $OT Eval Moderate Complexity: 1 Mod  Husna Krone,  OTR/L Acute Care Rehab Services  Office [phone] Pager: [phone]   Kelli Churn 12/05/2020, 3:29 PM

## 2020-12-05 NOTE — NC FL2 (Signed)
[place] MEDICAID FL2 LEVEL OF CARE SCREENING TOOL     IDENTIFICATION  Patient Name: Cory [name] Birthdate: 1945-06-11 Sex: male Admission Date (Current Location): 11/26/2020  [facility] and [place] Number:  Producer, television/film/video and Address:  [facility],  [address] [place], [place] [address]      Provider Number: [id number]  Attending Physician Name and Address:  Almon Hercules, MD  Relative Name and Phone Number:  Magda Paganini @ [phone]    Current Level of Care: Hospital Recommended Level of Care: Skilled Nursing Facility Prior Approval Number:    Date Approved/Denied:   PASRR Number: [id number]  Discharge Plan: SNF    Current Diagnoses: Patient Active Problem List   Diagnosis Date Noted   Moderate protein-calorie malnutrition (HCC)    Sepsis (HCC) 11/27/2020   Overweight (BMI 25.0-29.9) 11/27/2020   Hyperlipidemia 11/26/2020   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) 11/26/2020   Hypertension 11/26/2020   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    Orientation RESPIRATION BLADDER Height & Weight     Self, Time, Situation, Place  Normal Continent, External catheter Weight: 210 lb (95.3 kg) Height:  5\' 11"  (180.3 cm)  BEHAVIORAL SYMPTOMS/MOOD NEUROLOGICAL BOWEL NUTRITION STATUS      Continent    AMBULATORY STATUS COMMUNICATION OF NEEDS Skin   Limited Assist Verbally Surgical wounds, Other (Comment) (has Prevena wound VAC at surgical site)                       Personal Care Assistance Level of Assistance  Bathing, Dressing Bathing Assistance: Limited assistance   Dressing Assistance: Limited assistance     Functional Limitations Info             SPECIAL CARE FACTORS FREQUENCY  PT (By licensed PT), OT (By licensed OT)     PT Frequency: 5x/wk OT Frequency: 5x/wk            Contractures Contractures Info: Not present    Additional Factors Info  Code Status, Allergies, Insulin Sliding Scale Code  Status Info: Full Allergies Info: NKDA   Insulin Sliding Scale Info: see MAR       Current Medications (12/05/2020):  This is the current hospital active medication list Current Facility-Administered Medications  Medication Dose Route Frequency Provider Last Rate Last Admin   (feeding supplement) PROSource Plus liquid 30 mL  30 mL Oral BID WC Nadara Mustard, MD   30 mL at 12/04/20 1858   acetaminophen (TYLENOL) tablet 650 mg  650 mg Oral Q6H PRN Nadara Mustard, MD   650 mg at 12/05/20 0407   Or   acetaminophen (TYLENOL) suppository 650 mg  650 mg Rectal Q6H PRN Nadara Mustard, MD       alum & mag hydroxide-simeth (MAALOX/MYLANTA) 200-200-20 MG/5ML suspension 15-30 mL  15-30 mL Oral Q2H PRN Nadara Mustard, MD       ascorbic acid (VITAMIN C) tablet 1,000 mg  1,000 mg Oral Daily Nadara Mustard, MD   1,000 mg at 12/05/20 1029   atorvastatin (LIPITOR) tablet 20 mg  20 mg Oral QHS Nadara Mustard, MD   20 mg at 12/04/20 2308   bisacodyl (DULCOLAX) EC tablet 5 mg  5 mg Oral Daily PRN Nadara Mustard, MD       docusate sodium (COLACE) capsule 100 mg  100 mg Oral Daily Nadara Mustard, MD   100 mg at 12/05/20 1029   gabapentin (  NEURONTIN) capsule 300 mg  300 mg Oral TID [place], Eric J, DO   300 mg at 12/05/20 1029   guaiFENesin-dextromethorphan (ROBITUSSIN DM) 100-10 MG/5ML syrup 15 mL  15 mL Oral Q4H PRN Nadara Mustard, MD       hydrALAZINE (APRESOLINE) injection 5 mg  5 mg Intravenous Q20 Min PRN Nadara Mustard, MD       hydrALAZINE (APRESOLINE) tablet 25 mg  25 mg Oral Q6H PRN Nadara Mustard, MD       HYDROmorphone (DILAUDID) injection 0.5-1 mg  0.5-1 mg Intravenous Q2H PRN Nadara Mustard, MD   1 mg at 12/05/20 0407   insulin aspart (novoLOG) injection 0-15 Units  0-15 Units Subcutaneous TID WC Gonfa, Taye T, MD       insulin aspart (novoLOG) injection 0-5 Units  0-5 Units Subcutaneous QHS Gonfa, Taye T, MD       insulin aspart (novoLOG) injection 4 Units  4 Units Subcutaneous TID WC Gonfa, Taye T,  MD       insulin glargine-yfgn (SEMGLEE) injection 25 Units  25 Units Subcutaneous BID Gonfa, Taye T, MD       labetalol (NORMODYNE) injection 10 mg  10 mg Intravenous Q2H PRN Candelaria Stagers T, MD   10 mg at 12/05/20 1323   losartan (COZAAR) tablet 50 mg  50 mg Oral Daily Candelaria Stagers T, MD       magnesium sulfate IVPB 2 g 50 mL  2 g Intravenous Daily PRN Nadara Mustard, MD       metoprolol succinate (TOPROL-XL) 24 hr tablet 100 mg  100 mg Oral Daily Nadara Mustard, MD   100 mg at 12/05/20 1029   multivitamin with minerals tablet 1 tablet  1 tablet Oral Daily Rolly Salter, MD   1 tablet at 12/05/20 1029   NIFEdipine (PROCARDIA XL/NIFEDICAL XL) 24 hr tablet 60 mg  60 mg Oral Daily Nadara Mustard, MD   60 mg at 12/05/20 1028   nutrition supplement (JUVEN) (JUVEN) powder packet 1 packet  1 packet Oral BID BM Nadara Mustard, MD   1 packet at 12/05/20 1027   ondansetron (ZOFRAN) tablet 4 mg  4 mg Oral Q6H PRN Nadara Mustard, MD       Or   ondansetron [facility]) injection 4 mg  4 mg Intravenous Q6H PRN Nadara Mustard, MD   4 mg at 11/27/20 0502   oxyCODONE (Oxy IR/ROXICODONE) immediate release tablet 5 mg  5 mg Oral Q6H PRN Nadara Mustard, MD   5 mg at 12/05/20 1029   pantoprazole (PROTONIX) EC tablet 40 mg  40 mg Oral Daily Nadara Mustard, MD   40 mg at 12/05/20 1029   phenol (CHLORASEPTIC) mouth spray 1 spray  1 spray Mouth/Throat PRN Nadara Mustard, MD       polyethylene glycol (MIRALAX / GLYCOLAX) packet 17 g  17 g Oral Daily PRN Nadara Mustard, MD       potassium chloride SA (KLOR-CON) CR tablet 40 mEq  40 mEq Oral Q4H Gonfa, Taye T, MD   40 mEq at 12/05/20 1029   protein supplement (ENSURE MAX) liquid  11 oz Oral BID Nadara Mustard, MD   11 oz at 12/04/20 2308   sodium chloride flush (NS) 0.9 % injection 10-40 mL  10-40 mL Intracatheter PRN Nadara Mustard, MD   10 mL at 12/02/20 0347   tamsulosin (FLOMAX) capsule 0.4 mg  0.4 mg Oral  QHS Nadara Mustard, MD   0.4 mg at 12/04/20 2308   zinc sulfate  capsule 220 mg  220 mg Oral Daily Nadara Mustard, MD   220 mg at 12/05/20 1029     Discharge Medications: Please see discharge summary for a list of discharge medications.  Relevant Imaging Results:  Relevant Lab Results:   Additional Information SS# [id number];  COVID vaccine x 2 and booster x 1  Million Maharaj, LCSW

## 2020-12-05 NOTE — Evaluation (Signed)
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  Pt with hx of DM2, HTN, HLD, and R foot wound for ~1 month.  Clinical Impression  Pt admitted with above diagnosis. Prior to the last month , pt was completely independent and active.  He has had difficulties over the last month due to R foot wound/pain and is now s/p BKA.  Pt lives in a multi-level home with bed/bath upstairs.  Today at evaluation, pt had no pain a rest but with any movements to assess R LE he had extreme pain.  Did tolerate transfers with limb protector in place - requiring min A of  2 to stand and pivot with increased time for pain control and pain easing at rest. Pt requiring cues for safety and transfer technique.  Pt with good rehab potential but will need SNF at d/c. Pt currently with functional limitations due to the deficits listed below (see PT Problem List). Pt will benefit from skilled PT to increase their independence and safety with mobility to allow discharge to the venue listed below.       Follow Up Recommendations SNF    Equipment Recommendations  Rolling walker with 5" wheels;Wheelchair cushion (measurements PT);Wheelchair (measurements PT);3in1 (PT)    Recommendations for Other Services       Precautions / Restrictions Precautions Precautions: Fall Required Braces or Orthoses: Other Brace Other Brace: Residual LImb protector Restrictions RLE Weight Bearing: Non weight bearing      Mobility  Bed Mobility Overal bed mobility: Needs Assistance Bed Mobility: Supine to Sit     Supine to sit: HOB elevated;Min assist;+2 for safety/equipment     General bed mobility comments: Had assist of 2 for pain control.  Pt requiring cues for safety and transfer technique with  min A for R LE and to lift trunk    Transfers Overall transfer level: Needs assistance Equipment used: Rolling walker (2 wheeled) Transfers: Sit to/from [employer] Sit to Stand: Min assist;+2 safety/equipment;From elevated surface         General transfer comment: Performed sit to stand x 2 from bed with min A of 2 to rise for safety. CUes for hand placement.  With pivot educated on RW placement and RW use.  Ambulation/Gait             General Gait Details: unable, attempted a hop to chair but too painful from the jarring to R LE; able to pivot  Stairs            Wheelchair Mobility    Modified Rankin (Stroke Patients Only)       Balance Overall balance assessment: Needs assistance Sitting-balance support: No upper extremity supported Sitting balance-Leahy Scale: Good     Standing balance support: Bilateral upper extremity supported Standing balance-Leahy Scale: Poor Standing balance comment: requiring RW                             Pertinent Vitals/Pain Pain Assessment: Faces Faces Pain Scale: Hurts whole lot Pain Location: R residual limb but reports more phantom pain in foot with movement Pain Descriptors / Indicators: Sharp;Guarding;Grimacing Pain Intervention(s): Limited activity within patient's tolerance;Monitored during session;Repositioned;Patient requesting pain meds-RN notified (Reports no pain at  rest and pain eases quickly after movement)    Home Living Family/patient expects to be discharged to:: Skilled nursing facility Living Arrangements: Other (Comment) (with sister) Available Help at Discharge: Family;Available PRN/intermittently Type of Home: House Home Access: Stairs to enter   [employer] of Steps: 3 Home Layout: Two level;Bed/bath upstairs Home Equipment: None      Prior Function Level of Independence: Needs assistance   Gait / Transfers Assistance Needed: Reports independent without  AD  ADL's / Homemaking Assistance Needed: Reports independent with ADLs/IADLs  Comments: Prior to May when his foot wounds/issues began, he was independent - reports played soccer and liked to dance     Hand Dominance        Extremity/Trunk Assessment   Upper Extremity Assessment Upper Extremity Assessment: Defer to OT evaluation    Lower Extremity Assessment Lower Extremity Assessment: RLE deficits/detail;LLE deficits/detail RLE Deficits / Details: Limited testing due to pain.  Attempted to remove residual limb protector to assess leg and even with gentle movements/assist of 2, pt was unable to tolerate and grabbing therapist wrist to stop due to pain. Left residual limb protector in place, but when loosened noticed pt with tendency to have some knee flexion ~10-15 degrees. Educated on importance of getting full knee ext for future prosthetic and pt was able to demonstrate quad set with cues. Encouraged to perform quad sets.  R hip ROM was [facility] and strength was also 1/5 to assist with transfers. Pt tending to keep hip ER but corrected with cues. LLE Deficits / Details: WFL    Cervical / Trunk Assessment Cervical / Trunk Assessment: Normal  Communication   Communication: No difficulties  Cognition Arousal/Alertness: Awake/alert Behavior During Therapy: WFL for tasks assessed/performed Overall Cognitive Status: Within Functional Limits for tasks assessed                                        General Comments      Exercises Amputee Exercises Quad Sets: AROM;5 reps;Right;Seated   Assessment/Plan    PT Assessment Patient needs continued PT services  PT Problem List Decreased strength;Decreased mobility;Decreased safety awareness;Decreased range of motion;Decreased coordination;Decreased knowledge of precautions;Decreased activity tolerance;Decreased balance;Decreased knowledge of use of DME;Pain       PT Treatment Interventions DME instruction;Therapeutic  activities;Modalities;Gait training;Therapeutic exercise;Patient/family education;Stair training;Balance training;Functional mobility training;Wheelchair mobility training    PT Goals (Current goals can be found in the Care Plan section)  Acute Rehab PT Goals Patient Stated Goal: decrease pain PT Goal Formulation: With patient Time For Goal Achievement: 12/19/20 Potential to Achieve Goals: Good    Frequency Min 3X/week   Barriers to discharge Inaccessible home environment;Decreased caregiver support      Co-evaluation PT/OT/SLP Co-Evaluation/Treatment: Yes Reason for Co-Treatment: For patient/therapist safety;Other (comment) (pt's pain control) PT goals addressed during session: Mobility/safety with mobility         AM-PAC PT "6 Clicks" Mobility  Outcome Measure Help needed turning from your back to your side while in a flat bed without using bedrails?: A Little Help needed moving from lying on your back to sitting on the side of a flat bed without using bedrails?: A Lot Help needed moving to and from a bed to a chair (including a wheelchair)?: A Lot Help needed standing up from a chair using your arms (e.g., wheelchair or bedside chair)?: A Lot Help needed to walk in hospital room?: A Lot  Help needed climbing 3-5 steps with a railing? : Total 6 Click Score: 12    End of Session Equipment Utilized During Treatment: Gait belt;Other (comment) (limb protector) Activity Tolerance: Patient tolerated treatment well Patient left: with chair alarm set;in chair;with call bell/phone within reach Nurse Communication: Mobility status (min A of 2 pivot with RW) PT Visit Diagnosis: Other abnormalities of gait and mobility (R26.89);Muscle weakness (generalized) (M62.81);Pain Pain - Right/Left: Right Pain - part of body: Knee    Time: 1328-1401 PT Time Calculation (min) (ACUTE ONLY): 33 min   Charges:   PT Evaluation $PT Eval Moderate Complexity: 1 Andi Hence, PT Acute  Rehab Services Pager [phone] [facility] [phone]   Rayetta Humphrey 12/05/2020, 2:21 PM

## 2020-12-05 NOTE — Progress Notes (Signed)
Patient ID: Cory [name], male   DOB: [birth year], 75 y.o.   MRN: [id number] Patient is postoperative day 1 right below the knee amputation.  The wound VAC has a good suction fit without drainage.  Anticipate discharge to skilled nursing.  Patient has no complaints of pain this morning.

## 2020-12-05 NOTE — Progress Notes (Signed)
Inpatient Rehab Admissions Coordinator:    CIR consult received. PT is recommending SNF and Pt. Does not appear to require intensity of CIR. CIR to sign off.   Megan Salon, MS, CCC-SLP Rehab Admissions Coordinator  [phone] (office)'

## 2020-12-05 NOTE — Progress Notes (Signed)
PROGRESS NOTE  Cory [name] WUJ:[id number] DOB: 1945/06/10   PCP: Frederich Chick., MD  Patient is from: Home.  DOA: 11/26/2020 LOS: 9  Chief complaints:  Chief Complaint  Patient presents with   Code Sepsis     Brief Narrative / Interim history: 75 year old M with PMH of DM-2, HTN and HLD presenting with progressive right ankle/foot pain for about a month that did not improve with p.o. 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  Inflammatory markers elevated.  Recent ABI with good arterial flow.  He was started on broad-spectrum IV antibiotics.  Orthopedic surgery consulted.  Eventually, he underwent right-sided BKA by Dr. Lajoyce Corners on 12/04/2020.  Subjective: Seen and examined earlier this morning.  No major events overnight of this morning.  Pain fairly controlled.  He denies chest pain, dyspnea, GI or UTI symptoms.   Objective: Vitals:   12/05/20 0330 12/05/20 0440 12/05/20 0931 12/05/20 1247  BP: (!) 168/98 (!) 152/95 (!) 160/106 (!) 157/105  Pulse: 83 90 88 82  Resp: 17 17 18 16   Temp: 99.5 F (37.5 C) 98.3 F (36.8 C) 99.1 F (37.3 C)   TempSrc: Oral Oral    SpO2: 97% 94% 100% 100%  Weight:      Height:        Intake/Output Summary (Last 24 hours) at 12/05/2020 1427 Last data filed at 12/05/2020 0900 Gross per 24 hour  Intake 2385.53 ml  Output 2200 ml  Net 185.53 ml   Filed Weights   11/26/20 1825 12/04/20 0638  Weight: 95.3 kg 95.3 kg    Examination:  GENERAL: No apparent distress.  Nontoxic. HEENT: MMM.  Vision and hearing grossly intact.  NECK: Supple.  No apparent JVD.  RESP: On RA.  No IWOB.  Fair aeration bilaterally. CVS:  RRR. Heart sounds normal.  ABD/GI/GU: BS+. Abd soft, NTND.  MSK/EXT:  Moves extremities. No apparent deformity. S/p right BKA. SKIN: no apparent skin lesion or wound NEURO: Awake, alert and oriented appropriately.  No apparent focal neuro deficit. PSYCH: Calm. Normal affect.   Procedures:   12/04/2020-right BKA and wound VAC by Dr. Lajoyce Corners  Microbiology summarized: COVID-19 and influenza PCR nonreactive. Blood cultures NGTD.  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  Failed outpatient treatment with p.o. antibiotics twice.  MRI with cellulitis, myositis and acute osteomyelitis involving calcaneus.  Inflammatory markers elevated. -S/p right BKA on 7/29 which would give source control -Discontinue IV CTX and Zyvox -Pain control and DVT prophylaxis per surgery -Appreciate Ortho recs-NWB on right leg, continue wound VAC for 1 week and PT/OT  Uncontrolled IDDM-2 with hyperglycemia, hyperlipidemia and osteomyelitis: A1c 12.1%. Recent Labs  Lab 12/04/20 2044 12/04/20 2312 12/05/20 0328 12/05/20 0805 12/05/20 1244  GLUCAP 306* 210* 127* 70 163*  -Change SSI from sensitive to moderate -NovoLog 4 units 3 times daily with meals -Change Lantus from 50 units nightly to 25 units twice daily -Continue atorvastatin -Further adjustment as appropriate  Acute renal failure, not POA: Could be iatrogenic from Zosyn and vancomycin.  Resolved. Recent Labs    11/26/20 1839 11/27/20 0456 11/28/20 0432 11/29/20 0312 11/30/20 1300 12/01/20 0329 12/02/20 0346 12/03/20 0254 12/04/20 0451 12/05/20 0257  BUN 18 12 12  26* 21 15 13 13 10 14   CREATININE 1.05 0.83 0.76 1.64* 1.36* 1.24 0.95 1.07 0.96 0.74  -Discontinue IV fluid -Recheck renal function in the morning   Hyponatremia: Resolved.  Hypokalemia: K  3.2.  Mg 1.8. -K-Dur 40 mill equivalent x3   Essential hypertension: BP elevated. -Discontinue IV fluid -Continue home metoprolol and nifedipine -Resume home losartan at reduced dose. -Continue labetalol as needed  BPH without LUTS  -Continue tamsulosin 0.4 mg p.o. nightly  Increased nutrient needs Body mass index is 29.29 kg/m. Nutrition Problem: Increased nutrient needs Etiology: wound  healing Signs/Symptoms: estimated needs Interventions: Premier Protein, MVI, Prostat   DVT prophylaxis:  SCD's Start: 12/04/20 1114 Place and maintain sequential compression device Start: 11/27/20 1919  Code Status: Full code Family Communication: Patient and/or RN. Available if any question.  Level of care: Med-Surg Status is: Inpatient  Remains inpatient appropriate because:Persistent severe electrolyte disturbances, Unsafe d/c plan, and Inpatient level of care appropriate due to severity of illness  Dispo:  Patient From: Home  Planned Disposition: Skilled Nursing Facility  Medically stable for discharge: No         Consultants:  Orthopedic surgery   Sch Meds:  Scheduled Meds:  (feeding supplement) PROSource Plus  30 mL Oral BID WC   vitamin C  1,000 mg Oral Daily   atorvastatin  20 mg Oral QHS   docusate sodium  100 mg Oral Daily   gabapentin  300 mg Oral TID   insulin aspart  0-15 Units Subcutaneous TID WC   insulin aspart  0-5 Units Subcutaneous QHS   insulin aspart  4 Units Subcutaneous TID WC   insulin glargine-yfgn  25 Units Subcutaneous BID   losartan  50 mg Oral Daily   metoprolol succinate  100 mg Oral Daily   multivitamin with minerals  1 tablet Oral Daily   NIFEdipine  60 mg Oral Daily   nutrition supplement (JUVEN)  1 packet Oral BID BM   pantoprazole  40 mg Oral Daily   potassium chloride  40 mEq Oral Q4H   Ensure Max Protein  11 oz Oral BID   tamsulosin  0.4 mg Oral QHS   zinc sulfate  220 mg Oral Daily   Continuous Infusions:  magnesium sulfate bolus IVPB     PRN Meds:.acetaminophen **OR** acetaminophen, alum & mag hydroxide-simeth, bisacodyl, guaiFENesin-dextromethorphan, hydrALAZINE, hydrALAZINE, HYDROmorphone (DILAUDID) injection, labetalol, magnesium sulfate bolus IVPB, ondansetron **OR** ondansetron (ZOFRAN) IV, oxyCODONE, phenol, polyethylene glycol, sodium chloride flush  Antimicrobials: Anti-infectives (From admission, onward)     Start     Dose/Rate Route Frequency Ordered Stop   12/04/20 1200  ceFAZolin (ANCEF) 2 g in sodium chloride 0.9 % 100 mL IVPB  Status:  Discontinued        2 g 200 mL/hr over 30 Minutes Intravenous Every 8 hours 12/04/20 1113 12/04/20 1955   12/02/20 2100  cefTRIAXone (ROCEPHIN) 2 g in sodium chloride 0.9 % 100 mL IVPB  Status:  Discontinued        2 g 200 mL/hr over 30 Minutes Intravenous Every 24 hours 12/02/20 1419 12/05/20 1426   12/01/20 1400  ceFEPIme (MAXIPIME) 2 g in sodium chloride 0.9 % 100 mL IVPB  Status:  Discontinued        2 g 200 mL/hr over 30 Minutes Intravenous Every 8 hours 12/01/20 1222 12/02/20 1419   11/29/20 1200  ceFEPIme (MAXIPIME) 2 g in sodium chloride 0.9 % 100 mL IVPB  Status:  Discontinued        2 g 200 mL/hr over 30 Minutes Intravenous Every 12 hours 11/29/20 0823 12/01/20 1222   11/29/20 1000  linezolid (ZYVOX) IVPB 600 mg  Status:  Discontinued  600 mg 300 mL/hr over 60 Minutes Intravenous Every 12 hours 11/29/20 0823 12/05/20 1426   11/27/20 2100  vancomycin (VANCOCIN) IVPB 1000 mg/200 mL premix  Status:  Discontinued        1,000 mg 200 mL/hr over 60 Minutes Intravenous Every 12 hours 11/27/20 1947 11/29/20 0821   11/27/20 2100  piperacillin-tazobactam (ZOSYN) IVPB 3.375 g  Status:  Discontinued        3.375 g 12.5 mL/hr over 240 Minutes Intravenous Every 8 hours 11/27/20 1947 11/29/20 0821   11/27/20 0800  cefTRIAXone (ROCEPHIN) 2 g in sodium chloride 0.9 % 100 mL IVPB  Status:  Discontinued       See Hyperspace for full Linked Orders Report.   2 g 200 mL/hr over 30 Minutes Intravenous Every 24 hours 11/27/20 0058 11/27/20 1917   11/27/20 0200  metroNIDAZOLE (FLAGYL) tablet 500 mg  Status:  Discontinued       See Hyperspace for full Linked Orders Report.   500 mg Oral Every 8 hours 11/27/20 0058 11/27/20 1917   11/26/20 2315  ceFEPIme (MAXIPIME) 2 g in sodium chloride 0.9 % 100 mL IVPB        2 g 200 mL/hr over 30 Minutes Intravenous  Once  11/26/20 2314 11/26/20 2352   11/26/20 1900  vancomycin (VANCOCIN) IVPB 1000 mg/200 mL premix        1,000 mg 200 mL/hr over 60 Minutes Intravenous  Once 11/26/20 1845 11/26/20 2006        I have personally reviewed the following labs and images: CBC: Recent Labs  Lab 11/29/20 0312 12/01/20 0329 12/04/20 0451 12/05/20 0257  WBC 11.3* 9.6 5.7 6.4  HGB 11.9* 11.3* 11.0* 11.0*  HCT 35.6* 33.7* 32.6* 33.1*  MCV 81.8 80.8 80.9 81.3  PLT 230 226 205 205   BMP &GFR Recent Labs  Lab 12/01/20 0329 12/02/20 0346 12/03/20 0254 12/04/20 0451 12/05/20 0257  NA 135 138 135 135 135  K 3.2* 3.6 3.3* 3.2* 3.2*  CL 103 102 100 100 100  CO2 25 26 27 27 26   GLUCOSE 183* 163* 252* 111* 154*  BUN 15 13 13 10 14   CREATININE 1.24 0.95 1.07 0.96 0.74  CALCIUM 8.3* 8.3* 8.0* 7.9* 7.9*  MG  --  2.0  --  1.8 1.8   Estimated Creatinine Clearance: 96.9 mL/min (by C-G formula based on SCr of 0.74 mg/dL). Liver & Pancreas: No results for input(s): AST, ALT, ALKPHOS, BILITOT, PROT, ALBUMIN in the last 168 hours. No results for input(s): LIPASE, AMYLASE in the last 168 hours. No results for input(s): AMMONIA in the last 168 hours. Diabetic: No results for input(s): HGBA1C in the last 72 hours. Recent Labs  Lab 12/04/20 2044 12/04/20 2312 12/05/20 0328 12/05/20 0805 12/05/20 1244  GLUCAP 306* 210* 127* 70 163*   Cardiac Enzymes: No results for input(s): CKTOTAL, CKMB, CKMBINDEX, TROPONINI in the last 168 hours. No results for input(s): PROBNP in the last 8760 hours. Coagulation Profile: No results for input(s): INR, PROTIME in the last 168 hours. Thyroid Function Tests: No results for input(s): TSH, T4TOTAL, FREET4, T3FREE, THYROIDAB in the last 72 hours. Lipid Profile: No results for input(s): CHOL, HDL, LDLCALC, TRIG, CHOLHDL, LDLDIRECT in the last 72 hours. Anemia Panel: No results for input(s): VITAMINB12, FOLATE, FERRITIN, TIBC, IRON, RETICCTPCT in the last 72 hours. Urine  analysis:    Component Value Date/Time   COLORURINE STRAW (A) 11/26/2020 1900   APPEARANCEUR CLEAR 11/26/2020 1900   LABSPEC 1.028 11/26/2020  1900   PHURINE 6.0 11/26/2020 1900   GLUCOSEU >=500 (A) 11/26/2020 1900   HGBUR MODERATE (A) 11/26/2020 1900   BILIRUBINUR NEGATIVE 11/26/2020 1900   KETONESUR 20 (A) 11/26/2020 1900   PROTEINUR 30 (A) 11/26/2020 1900   NITRITE NEGATIVE 11/26/2020 1900   LEUKOCYTESUR NEGATIVE 11/26/2020 1900   Sepsis Labs: Invalid input(s): PROCALCITONIN, LACTICIDVEN  Microbiology: Recent Results (from the past 240 hour(s))  Culture, blood (Routine x 2)     Status: None   Collection Time: 11/26/20  6:39 PM   Specimen: BLOOD LEFT HAND  Result Value Ref Range Status   Specimen Description   Final    BLOOD LEFT HAND Performed at [facility], [address] [place], [place] [address]    Special Requests   Final    BOTTLES DRAWN AEROBIC AND ANAEROBIC Blood Culture adequate volume Performed at [facility], [address] [place], [place] [address]    Culture   Final    NO GROWTH 5 DAYS Performed at [facility], [address] [place], [place] [address]    Report Status 12/01/2020 FINAL  Final  Culture, blood (Routine x 2)     Status: None   Collection Time: 11/26/20  6:44 PM   Specimen: BLOOD  Result Value Ref Range Status   Specimen Description   Final    BLOOD BLOOD RIGHT HAND Performed at [facility], [address] [place], [place] [address]    Special Requests   Final    BOTTLES DRAWN AEROBIC AND ANAEROBIC Blood Culture adequate volume Performed at [facility], [address], [place], [place] [address]    Culture   Final    NO GROWTH 5 DAYS Performed at [facility], [address] [place], [place] [address]    Report Status 12/01/2020 FINAL  Final  Resp Panel by RT-PCR (Flu A&B, Covid) Nasopharyngeal Swab     Status: None   Collection Time: 11/26/20  7:00  PM   Specimen: Nasopharyngeal Swab; Nasopharyngeal(NP) swabs in vial transport medium  Result Value Ref Range Status   SARS Coronavirus 2 by RT PCR NEGATIVE NEGATIVE Final    Comment: (NOTE) SARS-CoV-2 target nucleic acids are NOT DETECTED.  The SARS-CoV-2 RNA is generally detectable in upper respiratory specimens during the acute phase of infection. The lowest concentration of SARS-CoV-2 viral copies this assay can detect is 138 copies/mL. A negative result does not preclude SARS-Cov-2 infection and should not be used as the sole basis for treatment or other patient management decisions. A negative result may occur with  improper specimen collection/handling, submission of specimen other than nasopharyngeal swab, presence of viral mutation(s) within the areas targeted by this assay, and inadequate number of viral copies(<138 copies/mL). A negative result must be combined with clinical observations, patient history, and epidemiological information. The expected result is Negative.  Fact Sheet for Patients:  BloggerCourse.com  Fact Sheet for Healthcare Providers:  SeriousBroker.it  This test is no t yet approved or cleared by the [place] FDA and  has been authorized for detection and/or diagnosis of SARS-CoV-2 by FDA under an Emergency Use Authorization (EUA). This EUA will remain  in effect (meaning this test can be used) for the duration of the COVID-19 declaration under Section 564(b)(1) of the Act, 21 U.S.C.section 360bbb-3(b)(1), unless the authorization is terminated  or revoked sooner.       Influenza A by PCR NEGATIVE NEGATIVE Final   Influenza  B by PCR NEGATIVE NEGATIVE Final    Comment: (NOTE) The Xpert Xpress SARS-CoV-2/FLU/RSV plus assay is intended as an aid in the diagnosis of influenza from Nasopharyngeal swab specimens and should not be used as a sole basis for treatment. Nasal washings and aspirates are  unacceptable for Xpert Xpress SARS-CoV-2/FLU/RSV testing.  Fact Sheet for Patients: BloggerCourse.com  Fact Sheet for Healthcare Providers: SeriousBroker.it  This test is not yet approved or cleared by the [place] FDA and has been authorized for detection and/or diagnosis of SARS-CoV-2 by FDA under an Emergency Use Authorization (EUA). This EUA will remain in effect (meaning this test can be used) for the duration of the COVID-19 declaration under Section 564(b)(1) of the Act, 21 U.S.C. section 360bbb-3(b)(1), unless the authorization is terminated or revoked.  Performed at [facility], [address] [place], [place] [address]   MRSA Next Gen by PCR, Nasal     Status: Abnormal   Collection Time: 11/27/20  2:08 AM   Specimen: Nasal Mucosa; Nasal Swab  Result Value Ref Range Status   MRSA by PCR Next Gen DETECTED (A) NOT DETECTED Final    Comment: RESULT CALLED TO, READ BACK BY AND VERIFIED WITH: DANIEL K. ON 11/27/2020 @ 0717 BY MECIAL J. (NOTE) The GeneXpert MRSA Assay (FDA approved for NASAL specimens only), is one component of a comprehensive MRSA colonization surveillance program. It is not intended to diagnose MRSA infection nor to guide or monitor treatment for MRSA infections. Test performance is not FDA approved in patients less than 50 years old. Performed at [facility], [address], [place], [place] [address]   Aerobic/Anaerobic Culture w Gram Stain (surgical/deep wound)     Status: None   Collection Time: 11/27/20  4:58 AM   Specimen: Wound  Result Value Ref Range Status   Specimen Description   Final    WOUND RT FOOT Performed at [facility], [address], [place], [place] [address]    Special Requests   Final    NONE Performed at [facility], [address] [place], [place] [address]    Gram Stain NO WBC SEEN NO  ORGANISMS SEEN   Final   Culture   Final    MODERATE ESCHERICHIA COLI NO ANAEROBES ISOLATED Performed at [facility], [address], [place], [place] [address]    Report Status 12/03/2020 FINAL  Final   Organism ID, Bacteria ESCHERICHIA COLI  Final      Susceptibility   Escherichia coli - MIC*    AMPICILLIN >=32 RESISTANT Resistant     CEFAZOLIN <=4 SENSITIVE Sensitive     CEFEPIME <=0.12 SENSITIVE Sensitive     CEFTAZIDIME <=1 SENSITIVE Sensitive     CEFTRIAXONE <=0.25 SENSITIVE Sensitive     CIPROFLOXACIN 0.5 SENSITIVE Sensitive     GENTAMICIN <=1 SENSITIVE Sensitive     IMIPENEM <=0.25 SENSITIVE Sensitive     TRIMETH/SULFA <=20 SENSITIVE Sensitive     AMPICILLIN/SULBACTAM >=32 RESISTANT Resistant     PIP/TAZO <=4 SENSITIVE Sensitive     * MODERATE ESCHERICHIA COLI    Radiology Studies: No results found.    Leeya Rusconi T. Kerim Statzer [facility]  If 7PM-7AM, please contact night-coverage [email or web address] 12/05/2020, 2:27 PM

## 2020-12-05 NOTE — TOC Progression Note (Signed)
Transition of Care [facility]) - Progression Note    Patient Details  Name: Styles Fambro MRN: [id number] Date of Birth: 08/17/1945  Transition of [facility]) CM/SW Contact  Amada Jupiter, LCSW Phone Number: 12/05/2020, 2:49 PM  Clinical Narrative:    Have begun SNF bed search and will keep pt/family posted on bed offers.   Expected Discharge Plan: Skilled Nursing Facility Barriers to Discharge: Continued Medical Work up  Expected Discharge Plan and Services Expected Discharge Plan: Skilled Nursing Facility In-house Referral: Clinical Social Work   Post Acute Care Choice: Skilled Nursing Facility Living arrangements for the past 2 months: Single Family Home                 DME Arranged: N/A DME Agency: NA                   Social Determinants of Health (SDOH) Interventions    Readmission Risk Interventions No flowsheet data found.

## 2020-12-05 NOTE — Plan of Care (Signed)
  Problem: Nutrition: Goal: Adequate nutrition will be maintained Outcome: Progressing   Problem: Coping: Goal: Level of anxiety will decrease Outcome: Progressing   Problem: Pain Managment: Goal: General experience of comfort will improve Outcome: Progressing   Problem: Safety: Goal: Ability to remain free from injury will improve Outcome: Progressing   

## 2020-12-06 DIAGNOSIS — A419 Sepsis, unspecified organism: Secondary | ICD-10-CM | POA: Diagnosis not present

## 2020-12-06 DIAGNOSIS — M86271 Subacute osteomyelitis, right ankle and foot: Secondary | ICD-10-CM | POA: Diagnosis not present

## 2020-12-06 DIAGNOSIS — E1165 Type 2 diabetes mellitus with hyperglycemia: Secondary | ICD-10-CM | POA: Diagnosis not present

## 2020-12-06 DIAGNOSIS — I10 Essential (primary) hypertension: Secondary | ICD-10-CM | POA: Diagnosis not present

## 2020-12-06 LAB — GLUCOSE, CAPILLARY
Glucose-Capillary: 103 mg/dL — ABNORMAL HIGH (ref 70–99)
Glucose-Capillary: 110 mg/dL — ABNORMAL HIGH (ref 70–99)
Glucose-Capillary: 125 mg/dL — ABNORMAL HIGH (ref 70–99)
Glucose-Capillary: 190 mg/dL — ABNORMAL HIGH (ref 70–99)
Glucose-Capillary: 191 mg/dL — ABNORMAL HIGH (ref 70–99)
Glucose-Capillary: 301 mg/dL — ABNORMAL HIGH (ref 70–99)
Glucose-Capillary: 85 mg/dL (ref 70–99)

## 2020-12-06 LAB — CBC
HCT: 32.6 % — ABNORMAL LOW (ref 39.0–52.0)
Hemoglobin: 10.8 g/dL — ABNORMAL LOW (ref 13.0–17.0)
MCH: 26.7 pg (ref 26.0–34.0)
MCHC: 33.1 g/dL (ref 30.0–36.0)
MCV: 80.7 fL (ref 80.0–100.0)
Platelets: 216 10*3/uL (ref 150–400)
RBC: 4.04 MIL/uL — ABNORMAL LOW (ref 4.22–5.81)
RDW: 12.5 % (ref 11.5–15.5)
WBC: 6.9 10*3/uL (ref 4.0–10.5)
nRBC: 0 % (ref 0.0–0.2)

## 2020-12-06 LAB — MAGNESIUM: Magnesium: 1.8 mg/dL (ref 1.7–2.4)

## 2020-12-06 LAB — RENAL FUNCTION PANEL
Albumin: 2.4 g/dL — ABNORMAL LOW (ref 3.5–5.0)
Anion gap: 10 (ref 5–15)
BUN: 11 mg/dL (ref 8–23)
CO2: 25 mmol/L (ref 22–32)
Calcium: 8.4 mg/dL — ABNORMAL LOW (ref 8.9–10.3)
Chloride: 101 mmol/L (ref 98–111)
Creatinine, Ser: 0.79 mg/dL (ref 0.61–1.24)
GFR, Estimated: >60 mL/min (ref >60)
Glucose, Bld: 73 mg/dL (ref 70–99)
Phosphorus: 2.2 mg/dL — ABNORMAL LOW (ref 2.5–4.6)
Potassium: 3.4 mmol/L — ABNORMAL LOW (ref 3.5–5.1)
Sodium: 136 mmol/L (ref 135–145)

## 2020-12-06 LAB — C-REACTIVE PROTEIN: CRP: 14.8 mg/dL — ABNORMAL HIGH (ref <1.0)

## 2020-12-06 MED ORDER — POTASSIUM CHLORIDE CRYS ER 20 MEQ PO TBCR
40.0000 meq | EXTENDED_RELEASE_TABLET | Freq: Once | ORAL | Status: AC
  Administered 2020-12-06: 40 meq via ORAL
  Filled 2020-12-06: qty 2

## 2020-12-06 MED ORDER — LOSARTAN POTASSIUM 50 MG PO TABS
100.0000 mg | ORAL_TABLET | Freq: Every day | ORAL | Status: DC
  Administered 2020-12-06 – 2020-12-08 (×3): 100 mg via ORAL
  Filled 2020-12-06 (×3): qty 2

## 2020-12-06 MED ORDER — ENOXAPARIN SODIUM 40 MG/0.4ML IJ SOSY
40.0000 mg | PREFILLED_SYRINGE | INTRAMUSCULAR | Status: DC
  Administered 2020-12-06 – 2020-12-08 (×3): 40 mg via SUBCUTANEOUS
  Filled 2020-12-06 (×3): qty 0.4

## 2020-12-06 MED ORDER — CARVEDILOL 25 MG PO TABS
25.0000 mg | ORAL_TABLET | Freq: Two times a day (BID) | ORAL | Status: DC
  Administered 2020-12-06 – 2020-12-08 (×6): 25 mg via ORAL
  Filled 2020-12-06 (×6): qty 1

## 2020-12-06 MED ORDER — POTASSIUM PHOSPHATES 15 MMOLE/5ML IV SOLN
20.0000 mmol | Freq: Once | INTRAVENOUS | Status: AC
  Administered 2020-12-06: 20 mmol via INTRAVENOUS
  Filled 2020-12-06: qty 6.67

## 2020-12-06 NOTE — Progress Notes (Signed)
PROGRESS NOTE  Cory [name] GMW:[id number] DOB: 10-Oct-1945   PCP: Frederich Chick., MD  Patient is from: Home.  DOA: 11/26/2020 LOS: 10  Chief complaints:  Chief Complaint  Patient presents with   Code Sepsis     Brief Narrative / Interim history: 75 year old M with PMH of DM-2, HTN and HLD presenting with progressive right ankle/foot pain for about a month that did not improve with p.o. 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  Inflammatory markers elevated.  Recent ABI with good arterial flow.  He was started on broad-spectrum IV antibiotics.  Orthopedic surgery consulted.  Eventually, he underwent right-sided BKA by Dr. Lajoyce Corners on 12/04/2020.  Therapy recommended SNF.  Medically stable once cleared by orthopedic surgery.  Subjective: Seen and examined earlier this morning.  No major events overnight of this morning.  No complaints.  Pain fairly controlled.  He denies chest pain, dyspnea, GI or UTI symptoms.   Objective: Vitals:   12/05/20 1706 12/05/20 1802 12/05/20 2109 12/06/20 0626  BP: (!) 162/94 (!) 153/92 (!) 146/87 (!) 158/99  Pulse: 84 82 84 85  Resp: 16  17 17   Temp:   98.2 F (36.8 C) 98.3 F (36.8 C)  TempSrc:   Oral Oral  SpO2: 100% 99% 99% 99%  Weight:      Height:        Intake/Output Summary (Last 24 hours) at 12/06/2020 1211 Last data filed at 12/06/2020 0651 Gross per 24 hour  Intake 360 ml  Output 2650 ml  Net -2290 ml   Filed Weights   11/26/20 1825 12/04/20 0638  Weight: 95.3 kg 95.3 kg    Examination:  GENERAL: No apparent distress.  Nontoxic. HEENT: MMM.  Vision and hearing grossly intact.  NECK: Supple.  No apparent JVD.  RESP:  No IWOB.  Fair aeration bilaterally. CVS:  RRR. Heart sounds normal.  ABD/GI/GU: BS+. Abd soft, NTND.  MSK/EXT:  Moves extremities. No apparent deformity.  S/p right BKA.  Wound VAC in place. SKIN: no apparent skin lesion or wound NEURO: Awake and alert. Oriented appropriately.   No apparent focal neuro deficit. PSYCH: Calm. Normal affect.   Procedures:  12/04/2020-right BKA and wound VAC by Dr. Lajoyce Corners  Microbiology summarized: COVID-19 and influenza PCR nonreactive. Blood cultures NGTD.  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  Failed outpatient treatment with p.o. antibiotics twice.  MRI with cellulitis, myositis and acute osteomyelitis involving calcaneus.  Inflammatory markers elevated. 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 -Pain control and DVT prophylaxis -Appreciate Ortho recs-NWB on right leg, continue wound VAC for 1 week and PT/OT  Uncontrolled IDDM-2 with hyperglycemia, hyperlipidemia and osteomyelitis: A1c 12.1%. Recent Labs  Lab 12/05/20 2109 12/06/20 0011 12/06/20 0400 12/06/20 0723 12/06/20 1144  GLUCAP 127* 85 110* 103* 190*  -Continue SSI-moderate -Continue NovoLog 4 units 3 times daily with meals -Changed Lantus from 50 units nightly to 25 units twice daily -Continue atorvastatin -Further adjustment as appropriate  Acute renal failure, not POA: Could be iatrogenic from Zosyn and vancomycin.  Resolved. Recent Labs    11/27/20 0456 11/28/20 0432 11/29/20 0312 11/30/20 1300 12/01/20 0329 12/02/20 0346 12/03/20 0254 12/04/20 0451 12/05/20 0257 12/06/20 0314  BUN 12 12 26* 21 15 13 13 10 14 11   CREATININE 0.83 0.76 1.64* 1.36* 1.24 0.95 1.07 0.96 0.74 0.79  -Monitor intermittently as  needed   Hyponatremia: Resolved.  Hypokalemia/hypophosphatemia: K3.4.  P2.2. -IV potassium phosphate 20 mmol x 1 -K-Dur 40 mill equivalent x1   Essential hypertension: BP elevated. -Change Toprol-XL to Coreg for better BP control -Continue home Procardia -Increase losartan to home dose -Continue labetalol as needed  BPH without LUTS  -Continue tamsulosin 0.4 mg p.o. nightly  Increased nutrient needs Body  mass index is 29.29 kg/m. Nutrition Problem: Increased nutrient needs Etiology: wound healing Signs/Symptoms: estimated needs Interventions: Premier Protein, MVI, Prostat   DVT prophylaxis:  enoxaparin (LOVENOX) injection 40 mg Start: 12/06/20 1300 SCD's Start: 12/04/20 1114 Place and maintain sequential compression device Start: 11/27/20 1919  Code Status: Full code Family Communication: Patient and/or RN. Available if any question.  Level of care: Med-Surg Status is: Inpatient  Remains inpatient appropriate because:Unsafe d/c plan  Dispo:  Patient From: Home  Planned Disposition: Skilled Nursing Facility  Medically stable for discharge: No         Consultants:  Orthopedic surgery   Sch Meds:  Scheduled Meds:  (feeding supplement) PROSource Plus  30 mL Oral BID WC   vitamin C  1,000 mg Oral Daily   atorvastatin  20 mg Oral QHS   carvedilol  25 mg Oral BID WC   docusate sodium  100 mg Oral Daily   enoxaparin (LOVENOX) injection  40 mg Subcutaneous Q24H   gabapentin  300 mg Oral TID   insulin aspart  0-15 Units Subcutaneous TID WC   insulin aspart  0-5 Units Subcutaneous QHS   insulin aspart  4 Units Subcutaneous TID WC   insulin glargine-yfgn  25 Units Subcutaneous BID   losartan  100 mg Oral Daily   multivitamin with minerals  1 tablet Oral Daily   NIFEdipine  60 mg Oral Daily   nutrition supplement (JUVEN)  1 packet Oral BID BM   pantoprazole  40 mg Oral Daily   Ensure Max Protein  11 oz Oral BID   tamsulosin  0.4 mg Oral QHS   zinc sulfate  220 mg Oral Daily   Continuous Infusions:  magnesium sulfate bolus IVPB     potassium PHOSPHATE IVPB (in mmol) 20 mmol (12/06/20 1110)   PRN Meds:.acetaminophen **OR** acetaminophen, alum & mag hydroxide-simeth, bisacodyl, guaiFENesin-dextromethorphan, hydrALAZINE, hydrALAZINE, HYDROmorphone (DILAUDID) injection, labetalol, magnesium sulfate bolus IVPB, ondansetron **OR** ondansetron (ZOFRAN) IV, oxyCODONE, phenol,  polyethylene glycol, sodium chloride flush  Antimicrobials: Anti-infectives (From admission, onward)    Start     Dose/Rate Route Frequency Ordered Stop   12/04/20 1200  ceFAZolin (ANCEF) 2 g in sodium chloride 0.9 % 100 mL IVPB  Status:  Discontinued        2 g 200 mL/hr over 30 Minutes Intravenous Every 8 hours 12/04/20 1113 12/04/20 1955   12/02/20 2100  cefTRIAXone (ROCEPHIN) 2 g in sodium chloride 0.9 % 100 mL IVPB  Status:  Discontinued        2 g 200 mL/hr over 30 Minutes Intravenous Every 24 hours 12/02/20 1419 12/05/20 1426   12/01/20 1400  ceFEPIme (MAXIPIME) 2 g in sodium chloride 0.9 % 100 mL IVPB  Status:  Discontinued        2 g 200 mL/hr over 30 Minutes Intravenous Every 8 hours 12/01/20 1222 12/02/20 1419   11/29/20 1200  ceFEPIme (MAXIPIME) 2 g in sodium chloride 0.9 % 100 mL IVPB  Status:  Discontinued        2 g 200 mL/hr over 30 Minutes Intravenous Every 12 hours 11/29/20 0823 12/01/20 1222  11/29/20 1000  linezolid (ZYVOX) IVPB 600 mg  Status:  Discontinued        600 mg 300 mL/hr over 60 Minutes Intravenous Every 12 hours 11/29/20 0823 12/05/20 1426   11/27/20 2100  vancomycin (VANCOCIN) IVPB 1000 mg/200 mL premix  Status:  Discontinued        1,000 mg 200 mL/hr over 60 Minutes Intravenous Every 12 hours 11/27/20 1947 11/29/20 0821   11/27/20 2100  piperacillin-tazobactam (ZOSYN) IVPB 3.375 g  Status:  Discontinued        3.375 g 12.5 mL/hr over 240 Minutes Intravenous Every 8 hours 11/27/20 1947 11/29/20 0821   11/27/20 0800  cefTRIAXone (ROCEPHIN) 2 g in sodium chloride 0.9 % 100 mL IVPB  Status:  Discontinued       See Hyperspace for full Linked Orders Report.   2 g 200 mL/hr over 30 Minutes Intravenous Every 24 hours 11/27/20 0058 11/27/20 1917   11/27/20 0200  metroNIDAZOLE (FLAGYL) tablet 500 mg  Status:  Discontinued       See Hyperspace for full Linked Orders Report.   500 mg Oral Every 8 hours 11/27/20 0058 11/27/20 1917   11/26/20 2315  ceFEPIme  (MAXIPIME) 2 g in sodium chloride 0.9 % 100 mL IVPB        2 g 200 mL/hr over 30 Minutes Intravenous  Once 11/26/20 2314 11/26/20 2352   11/26/20 1900  vancomycin (VANCOCIN) IVPB 1000 mg/200 mL premix        1,000 mg 200 mL/hr over 60 Minutes Intravenous  Once 11/26/20 1845 11/26/20 2006        I have personally reviewed the following labs and images: CBC: Recent Labs  Lab 12/01/20 0329 12/04/20 0451 12/05/20 0257 12/06/20 0314  WBC 9.6 5.7 6.4 6.9  HGB 11.3* 11.0* 11.0* 10.8*  HCT 33.7* 32.6* 33.1* 32.6*  MCV 80.8 80.9 81.3 80.7  PLT 226 205 205 216   BMP &GFR Recent Labs  Lab 12/02/20 0346 12/03/20 0254 12/04/20 0451 12/05/20 0257 12/06/20 0314  NA 138 135 135 135 136  K 3.6 3.3* 3.2* 3.2* 3.4*  CL 102 100 100 100 101  CO2 26 27 27 26 25   GLUCOSE 163* 252* 111* 154* 73  BUN 13 13 10 14 11   CREATININE 0.95 1.07 0.96 0.74 0.79  CALCIUM 8.3* 8.0* 7.9* 7.9* 8.4*  MG 2.0  --  1.8 1.8 1.8  PHOS  --   --   --   --  2.2*   Estimated Creatinine Clearance: 96.9 mL/min (by C-G formula based on SCr of 0.79 mg/dL). Liver & Pancreas: Recent Labs  Lab 12/06/20 0314  ALBUMIN 2.4*   No results for input(s): LIPASE, AMYLASE in the last 168 hours. No results for input(s): AMMONIA in the last 168 hours. Diabetic: No results for input(s): HGBA1C in the last 72 hours. Recent Labs  Lab 12/05/20 2109 12/06/20 0011 12/06/20 0400 12/06/20 0723 12/06/20 1144  GLUCAP 127* 85 110* 103* 190*   Cardiac Enzymes: No results for input(s): CKTOTAL, CKMB, CKMBINDEX, TROPONINI in the last 168 hours. No results for input(s): PROBNP in the last 8760 hours. Coagulation Profile: No results for input(s): INR, PROTIME in the last 168 hours. Thyroid Function Tests: No results for input(s): TSH, T4TOTAL, FREET4, T3FREE, THYROIDAB in the last 72 hours. Lipid Profile: No results for input(s): CHOL, HDL, LDLCALC, TRIG, CHOLHDL, LDLDIRECT in the last 72 hours. Anemia Panel: No results for  input(s): VITAMINB12, FOLATE, FERRITIN, TIBC, IRON, RETICCTPCT in the last  72 hours. Urine analysis:    Component Value Date/Time   COLORURINE STRAW (A) 11/26/2020 1900   APPEARANCEUR CLEAR 11/26/2020 1900   LABSPEC 1.028 11/26/2020 1900   PHURINE 6.0 11/26/2020 1900   GLUCOSEU >=500 (A) 11/26/2020 1900   HGBUR MODERATE (A) 11/26/2020 1900   BILIRUBINUR NEGATIVE 11/26/2020 1900   KETONESUR 20 (A) 11/26/2020 1900   PROTEINUR 30 (A) 11/26/2020 1900   NITRITE NEGATIVE 11/26/2020 1900   LEUKOCYTESUR NEGATIVE 11/26/2020 1900   Sepsis Labs: Invalid input(s): PROCALCITONIN, LACTICIDVEN  Microbiology: Recent Results (from the past 240 hour(s))  Culture, blood (Routine x 2)     Status: None   Collection Time: 11/26/20  6:39 PM   Specimen: BLOOD LEFT HAND  Result Value Ref Range Status   Specimen Description   Final    BLOOD LEFT HAND Performed at [facility], [address] [place], [place] [address]    Special Requests   Final    BOTTLES DRAWN AEROBIC AND ANAEROBIC Blood Culture adequate volume Performed at [facility], [address], [place], [place] [address]    Culture   Final    NO GROWTH 5 DAYS Performed at [facility], [address], [place], [place] [address]    Report Status 12/01/2020 FINAL  Final  Culture, blood (Routine x 2)     Status: None   Collection Time: 11/26/20  6:44 PM   Specimen: BLOOD  Result Value Ref Range Status   Specimen Description   Final    BLOOD BLOOD RIGHT HAND Performed at [facility], [address], [place], [place] [address]    Special Requests   Final    BOTTLES DRAWN AEROBIC AND ANAEROBIC Blood Culture adequate volume Performed at [facility], [address], [place], [place] [address]    Culture   Final    NO GROWTH 5 DAYS Performed at [facility], [address], [place], [place] [address]    Report Status 12/01/2020 FINAL  Final  Resp Panel by  RT-PCR (Flu A&B, Covid) Nasopharyngeal Swab     Status: None   Collection Time: 11/26/20  7:00 PM   Specimen: Nasopharyngeal Swab; Nasopharyngeal(NP) swabs in vial transport medium  Result Value Ref Range Status   SARS Coronavirus 2 by RT PCR NEGATIVE NEGATIVE Final    Comment: (NOTE) SARS-CoV-2 target nucleic acids are NOT DETECTED.  The SARS-CoV-2 RNA is generally detectable in upper respiratory specimens during the acute phase of infection. The lowest concentration of SARS-CoV-2 viral copies this assay can detect is 138 copies/mL. A negative result does not preclude SARS-Cov-2 infection and should not be used as the sole basis for treatment or other patient management decisions. A negative result may occur with  improper specimen collection/handling, submission of specimen other than nasopharyngeal swab, presence of viral mutation(s) within the areas targeted by this assay, and inadequate number of viral copies(<138 copies/mL). A negative result must be combined with clinical observations, patient history, and epidemiological information. The expected result is Negative.  Fact Sheet for Patients:  BloggerCourse.com  Fact Sheet for Healthcare Providers:  SeriousBroker.it  This test is no t yet approved or cleared by the [place] FDA and  has been authorized for detection and/or diagnosis of SARS-CoV-2 by FDA under an Emergency Use Authorization (EUA). This EUA will remain  in effect (meaning this test can be used) for the duration of the COVID-19 declaration under Section 564(b)(1) of the Act,  21 U.S.C.section 360bbb-3(b)(1), unless the authorization is terminated  or revoked sooner.       Influenza A by PCR NEGATIVE NEGATIVE Final   Influenza B by PCR NEGATIVE NEGATIVE Final    Comment: (NOTE) The Xpert Xpress SARS-CoV-2/FLU/RSV plus assay is intended as an aid in the diagnosis of influenza from Nasopharyngeal swab  specimens and should not be used as a sole basis for treatment. Nasal washings and aspirates are unacceptable for Xpert Xpress SARS-CoV-2/FLU/RSV testing.  Fact Sheet for Patients: BloggerCourse.com  Fact Sheet for Healthcare Providers: SeriousBroker.it  This test is not yet approved or cleared by the [place] FDA and has been authorized for detection and/or diagnosis of SARS-CoV-2 by FDA under an Emergency Use Authorization (EUA). This EUA will remain in effect (meaning this test can be used) for the duration of the COVID-19 declaration under Section 564(b)(1) of the Act, 21 U.S.C. section 360bbb-3(b)(1), unless the authorization is terminated or revoked.  Performed at [facility], [address], [place], [place] [address]   MRSA Next Gen by PCR, Nasal     Status: Abnormal   Collection Time: 11/27/20  2:08 AM   Specimen: Nasal Mucosa; Nasal Swab  Result Value Ref Range Status   MRSA by PCR Next Gen DETECTED (A) NOT DETECTED Final    Comment: RESULT CALLED TO, READ BACK BY AND VERIFIED WITH: DANIEL K. ON 11/27/2020 @ 0717 BY MECIAL J. (NOTE) The GeneXpert MRSA Assay (FDA approved for NASAL specimens only), is one component of a comprehensive MRSA colonization surveillance program. It is not intended to diagnose MRSA infection nor to guide or monitor treatment for MRSA infections. Test performance is not FDA approved in patients less than 8 years old. Performed at [facility], [address], [place], [place] [address]   Aerobic/Anaerobic Culture w Gram Stain (surgical/deep wound)     Status: None   Collection Time: 11/27/20  4:58 AM   Specimen: Wound  Result Value Ref Range Status   Specimen Description   Final    WOUND RT FOOT Performed at [facility], [address] [place], [place] [address]    Special Requests   Final    NONE Performed at [facility], [address] [place], [place] [address]    Gram Stain NO WBC SEEN NO ORGANISMS SEEN   Final   Culture   Final    MODERATE ESCHERICHIA COLI NO ANAEROBES ISOLATED Performed at [facility], [address] [place], [place] [address]    Report Status 12/03/2020 FINAL  Final   Organism ID, Bacteria ESCHERICHIA COLI  Final      Susceptibility   Escherichia coli - MIC*    AMPICILLIN >=32 RESISTANT Resistant     CEFAZOLIN <=4 SENSITIVE Sensitive     CEFEPIME <=0.12 SENSITIVE Sensitive     CEFTAZIDIME <=1 SENSITIVE Sensitive     CEFTRIAXONE <=0.25 SENSITIVE Sensitive     CIPROFLOXACIN 0.5 SENSITIVE Sensitive     GENTAMICIN <=1 SENSITIVE Sensitive     IMIPENEM <=0.25 SENSITIVE Sensitive     TRIMETH/SULFA <=20 SENSITIVE Sensitive     AMPICILLIN/SULBACTAM >=32 RESISTANT Resistant     PIP/TAZO <=4 SENSITIVE Sensitive     * MODERATE ESCHERICHIA COLI    Radiology Studies: No results found.    Sibbie Flammia [facility]  If 7PM-7AM, please contact night-coverage [email or web address] 12/06/2020, 12:11 PM

## 2020-12-06 NOTE — Progress Notes (Signed)
   Subjective: 2 Days Post-Op Procedure(s) (LRB): RIGHT BELOW KNEE AMPUTATION (Right) APPLICATION OF WOUND VAC (Right) Patient reports pain as moderate.    Objective: Vital signs in last 24 hours: Temp:  [98.2 F (36.8 C)-98.3 F (36.8 C)] 98.3 F (36.8 C) (07/31 0626) Pulse Rate:  [82-85] 85 (07/31 0626) Resp:  [16-17] 17 (07/31 0626) BP: (146-162)/(87-105) 158/99 (07/31 0626) SpO2:  [99 %-100 %] 99 % (07/31 0626)  Intake/Output from previous day: 07/30 0701 - 07/31 0700 In: 480 [P.O.:480] Out: 3250 [Urine:3250] Intake/Output this shift: No intake/output data recorded.  Recent Labs    12/04/20 0451 12/05/20 0257 12/06/20 0314  HGB 11.0* 11.0* 10.8*   Recent Labs    12/05/20 0257 12/06/20 0314  WBC 6.4 6.9  RBC 4.07* 4.04*  HCT 33.1* 32.6*  PLT 205 216   Recent Labs    12/05/20 0257 12/06/20 0314  NA 135 136  K 3.2* 3.4*  CL 100 101  CO2 26 25  BUN 14 11  CREATININE 0.74 0.79  GLUCOSE 154* 73  CALCIUM 7.9* 8.4*   No results for input(s): LABPT, INR in the last 72 hours.  Stump protector on .  No results found.  Assessment/Plan: 2 Days Post-Op Procedure(s) (LRB): RIGHT BELOW KNEE AMPUTATION (Right) APPLICATION OF WOUND VAC (Right) Up with therapy. Over head trapeze ordered.   Eldred Manges 12/06/2020, 10:35 AM

## 2020-12-06 NOTE — TOC Progression Note (Addendum)
Transition of Care [facility]) - Progression Note    Patient Details  Name: Cory [name] MRN: [id number] Date of Birth: 22-Jan-1946  [facility] CM/SW Contact  Cory [name], [place], [place] Phone Number: 12/06/2020, 9:40 AM  Clinical Narrative:     Several unsuccessful attempts were made to try and notify patient, as well as niece, Kellie Shropshire of patient's one bed offer, which is to [facility].  HIPAA compliant messages were left on voicemail for [name], as CSW continues to await a return call.  If patient and [name] accept bed offer, CSW will contact [employer] and begin obtaining insurance authorization through [employer]. Addendum:  Return call received from [name], who reported that she and patient are only interested in placement at [facility] or [facility].  CSW explained to patient and [name] that CSW will confirm that patient's FL-2 Form has been faxed to both of these facilities of interest, in an attempt to pursue bed offers.    Expected Discharge Plan: Skilled Nursing Facility Barriers to Discharge: Continued Medical Work up  Expected Discharge Plan and Services Expected Discharge Plan: Skilled Nursing Facility In-house Referral: Clinical Social Work   Post Acute Care Choice: Skilled Nursing Facility Living arrangements for the past 2 months: Single Family Home                 DME Arranged: N/A DME Agency: NA             Social Determinants of Health (SDOH) Interventions    Readmission Risk Interventions No flowsheet data found.  Danford Bad, BSW, MSW, [employer]  Licensed Visual merchandiser  [employer]  Mailing Address-[address] [place], [place] [address] Physical Address-[address], [place], [place] [address] Toll Free Main # [phone] Fax # [phone] Cell # [phone]  [email or web address]

## 2020-12-07 DIAGNOSIS — M86071 Acute hematogenous osteomyelitis, right ankle and foot: Secondary | ICD-10-CM | POA: Diagnosis not present

## 2020-12-07 DIAGNOSIS — A419 Sepsis, unspecified organism: Secondary | ICD-10-CM | POA: Diagnosis not present

## 2020-12-07 DIAGNOSIS — E1165 Type 2 diabetes mellitus with hyperglycemia: Secondary | ICD-10-CM | POA: Diagnosis not present

## 2020-12-07 DIAGNOSIS — E785 Hyperlipidemia, unspecified: Secondary | ICD-10-CM | POA: Diagnosis not present

## 2020-12-07 LAB — RENAL FUNCTION PANEL
Albumin: 2.2 g/dL — ABNORMAL LOW (ref 3.5–5.0)
Anion gap: 9 (ref 5–15)
BUN: 25 mg/dL — ABNORMAL HIGH (ref 8–23)
CO2: 24 mmol/L (ref 22–32)
Calcium: 8.1 mg/dL — ABNORMAL LOW (ref 8.9–10.3)
Chloride: 99 mmol/L (ref 98–111)
Creatinine, Ser: 0.92 mg/dL (ref 0.61–1.24)
GFR, Estimated: >60 mL/min (ref >60)
Glucose, Bld: 166 mg/dL — ABNORMAL HIGH (ref 70–99)
Phosphorus: 3 mg/dL (ref 2.5–4.6)
Potassium: 3.8 mmol/L (ref 3.5–5.1)
Sodium: 132 mmol/L — ABNORMAL LOW (ref 135–145)

## 2020-12-07 LAB — HEMOGLOBIN AND HEMATOCRIT, BLOOD
HCT: 30 % — ABNORMAL LOW (ref 39.0–52.0)
Hemoglobin: 9.8 g/dL — ABNORMAL LOW (ref 13.0–17.0)

## 2020-12-07 LAB — GLUCOSE, CAPILLARY
Glucose-Capillary: 114 mg/dL — ABNORMAL HIGH (ref 70–99)
Glucose-Capillary: 158 mg/dL — ABNORMAL HIGH (ref 70–99)
Glucose-Capillary: 226 mg/dL — ABNORMAL HIGH (ref 70–99)
Glucose-Capillary: 126 mg/dL — ABNORMAL HIGH (ref 70–99)

## 2020-12-07 LAB — RESP PANEL BY RT-PCR (FLU A&B, COVID) ARPGX2
Influenza A by PCR: NEGATIVE
Influenza B by PCR: NEGATIVE
SARS Coronavirus 2 by RT PCR: NEGATIVE

## 2020-12-07 LAB — MAGNESIUM: Magnesium: 1.9 mg/dL (ref 1.7–2.4)

## 2020-12-07 MED ORDER — ACETAMINOPHEN 325 MG PO TABS: 650.0000 mg | ORAL_TABLET | Freq: Four times a day (QID) | ORAL | Status: AC | PRN

## 2020-12-07 MED ORDER — COLCHICINE 0.6 MG PO TABS: 0.6000 mg | ORAL_TABLET | Freq: Every day | ORAL | 0 refills | Status: DC | PRN

## 2020-12-07 MED ORDER — OXYCODONE HCL 5 MG PO TABS: 5.0000 mg | ORAL_TABLET | Freq: Four times a day (QID) | ORAL | 0 refills | Status: AC | PRN

## 2020-12-07 MED ORDER — TAMSULOSIN HCL 0.4 MG PO CAPS: 0.4000 mg | ORAL_CAPSULE | Freq: Every day | ORAL | Status: DC

## 2020-12-07 NOTE — Progress Notes (Signed)
Physical Therapy Treatment Patient Details Name: Cory [name] MRN: [id number] DOB: Jan 15, 1946 Today's Date: 12/07/2020    History of Present Illness Pt is 75 yo male who presented with sepsis due to R calcaneal osteomyelitis with cellulitis on 11/26/20.  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  Pt with hx of DM2, HTN, HLD, and R foot wound for ~1 month.    PT Comments    Pt making gradual progress today.  Improved pain control -tolerating some exercises and was able to hop a few feet to the chair.  Also, demonstrating improved knee position - holding in neutral rotation and knee straight at rest.  Continue to progress as able.  Does need assist of 2 for transfers for safety.    Follow Up Recommendations  SNF     Equipment Recommendations  Rolling walker with 5" wheels;Wheelchair cushion (measurements PT);Wheelchair (measurements PT);3in1 (PT)    Recommendations for Other Services       Precautions / Restrictions Precautions Precautions: Fall Required Braces or Orthoses: Other Brace Other Brace: Residual LImb protector Restrictions RLE Weight Bearing: Non weight bearing    Mobility  Bed Mobility Overal bed mobility: Needs Assistance Bed Mobility: Supine to Sit     Supine to sit: HOB elevated;Min assist;+2 for safety/equipment     General bed mobility comments: Had assist of 2 for pain control and managing limb.  Pt requiring cues for safety and transfer technique with min A for R LE and to lift trunk.  Increased time.    Transfers Overall transfer level: Needs assistance Equipment used: Rolling walker (2 wheeled) Transfers: Sit to/from [employer] Sit to Stand: Min assist;+2 safety/equipment;From elevated surface         General transfer comment: Performed sit to stand x 2 from bed with min A of 2 to rise for safety. Cues for hand placement.  Ambulation/Gait Ambulation/Gait assistance: Min assist;+2 safety/equipment Gait Distance (Feet): 3 Feet Assistive  device: Rolling walker (2 wheeled) Gait Pattern/deviations: Decreased stride length Gait velocity: decreased   General Gait Details: Pt able to hop a few steps on L LE to chair with cues for RW use and min A of 2 for safety   Stairs             Wheelchair Mobility    Modified Rankin (Stroke Patients Only)       Balance Overall balance assessment: Needs assistance Sitting-balance support: No upper extremity supported Sitting balance-Leahy Scale: Good     Standing balance support: Bilateral upper extremity supported Standing balance-Leahy Scale: Poor Standing balance comment: requiring RW but maintains balance on 1 leg and RW without assist                            Cognition Arousal/Alertness: Awake/alert Behavior During Therapy: WFL for tasks assessed/performed Overall Cognitive Status: Within Functional Limits for tasks assessed                                        Exercises Amputee Exercises Quad Sets: AROM;Both;10 reps;Supine Hip ABduction/ADduction: AAROM;Right;10 reps;Supine Knee Flexion: AAROM;Right;10 reps;Supine (limited motion, therapist assisted in lifting leg) Straight Leg Raises: AAROM;Right;10 reps;Supine    General Comments General comments (skin integrity, edema, etc.): Educated on limb protector and resting with leg straight      Pertinent Vitals/Pain Pain Assessment: 0-10 Pain Score: 5  Pain  Location: R residual limb Pain Descriptors / Indicators: Sharp;Guarding;Grimacing Pain Intervention(s): Limited activity within patient's tolerance;Monitored during session;Premedicated before session    Home Living                      Prior Function            PT Goals (current goals can now be found in the care plan section) Acute Rehab PT Goals Patient Stated Goal: hop to bathroom PT Goal Formulation: With patient Time For Goal Achievement: 12/19/20 Potential to Achieve Goals: Good Progress towards PT  goals: Progressing toward goals    Frequency    Min 3X/week      PT Plan Current plan remains appropriate    Co-evaluation              AM-PAC PT "6 Clicks" Mobility   Outcome Measure  Help needed turning from your back to your side while in a flat bed without using bedrails?: A Little Help needed moving from lying on your back to sitting on the side of a flat bed without using bedrails?: A Lot Help needed moving to and from a bed to a chair (including a wheelchair)?: A Lot Help needed standing up from a chair using your arms (e.g., wheelchair or bedside chair)?: A Lot Help needed to walk in hospital room?: A Lot Help needed climbing 3-5 steps with a railing? : A Lot 6 Click Score: 13    End of Session Equipment Utilized During Treatment: Gait belt (limb protector) Activity Tolerance: Patient tolerated treatment well Patient left: with chair alarm set;in chair;with call bell/phone within reach Nurse Communication: Mobility status (assist of 2 for safety) PT Visit Diagnosis: Other abnormalities of gait and mobility (R26.89);Muscle weakness (generalized) (M62.81);Pain Pain - Right/Left: Right Pain - part of body: Knee     Time: 0998-3382 PT Time Calculation (min) (ACUTE ONLY): 19 min  Charges:  $Therapeutic Activity: 8-22 mins                     Anise Salvo, PT Acute Rehab Services Pager [phone] [facility] [phone]    Rayetta Humphrey 12/07/2020, 3:15 PM

## 2020-12-07 NOTE — Discharge Summary (Signed)
Physician Discharge Summary  Crete VOZ:[id number] DOB: Sep 23, 1945 DOA: 11/26/2020  PCP: Sherald Hess., MD  Admit date: 11/26/2020 Discharge date: 12/07/2020  Admitted From: Home Disposition: SNF  Recommendations for Outpatient Follow-up:  Follow ups as below. Please obtain CBC/BMP/Mag at follow up Please follow up on the following pending results: None    Discharge Condition: Stable CODE STATUS: Full code   Follow-up Information     Cory Minion, MD Follow up in 1 week(s).   Specialty: Orthopedic Surgery Contact information: [place] [address] [phone]                  Hospital Course: 75 year old M with PMH of DM-2, HTN and HLD presenting with progressive right ankle/foot pain for about a month that did not improve with p.o. 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  Inflammatory markers elevated.  Recent ABI with good arterial flow.  He was started on broad-spectrum IV antibiotics.  Orthopedic surgery consulted.  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  Cleared for discharge by orthopedic surgery with portable Prevena wound VAC.  Therapy recommended SNF.  See individual problem list below for more on hospital course.  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  Failed outpatient treatment with p.o. antibiotics twice.  MRI with cellulitis, myositis and acute osteomyelitis involving calcaneus.  Inflammatory markers elevated but improved. 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 -As needed Tylenol and oxycodone for pain -Appreciate Ortho recs-NWB on right leg, Prevena wound VAC for 1 week -Outpatient follow-up with orthopedic surgery in 1 week.   Uncontrolled IDDM-2 with hyperglycemia, hyperlipidemia and osteomyelitis: A1c 12.1%. Recent Labs   Lab 12/06/20 1144 12/06/20 1622 12/06/20 1951 12/06/20 2353 12/07/20 0722  GLUCAP 190* 301* 191* 125* 114*  -Discharged on home Lantus, metformin, Trulicity, Farxiga and statin. -Heart healthy and diabetic diet.   Acute renal failure, not POA: Could be iatrogenic from Zosyn and vancomycin.  Resolved.   Hyponatremia: Resolved.   Hypokalemia/hypophosphatemia: Resolved.   Essential hypertension: Normotensive. -Continue home Toprol-XL, Procardia, losartan/HCTZ   BPH without LUTS -Continue tamsulosin 0.4 mg p.o. nightly   Increased nutrient needs Body mass index is 29.29 kg/m. Nutrition Problem: Increased nutrient needs Etiology: wound healing Signs/Symptoms: estimated needs Interventions: Premier Protein, MVI, Prostat      Discharge Exam: Vitals:   12/06/20 2036 12/07/20 0522  BP: 126/69 130/86  Pulse: 83 84  Resp: 17 17  Temp: 98.3 F (36.8 C) 98.2 F (36.8 C)  SpO2: 100% 98%    GENERAL: No apparent distress.  Nontoxic. HEENT: MMM.  Vision and hearing grossly intact.  NECK: Supple.  No apparent JVD.  RESP: On RA.  No IWOB.  Fair aeration bilaterally. CVS:  RRR. Heart sounds normal.  ABD/GI/GU: Bowel sounds present. Soft. Non tender.  MSK/EXT:  Moves extremities. No apparent deformity. S/p right BKA.  Wound VAC and dressing in place. SKIN: Surgical RLE wound NEURO: Awake, alert and oriented appropriately.  No apparent focal neuro deficit. PSYCH: Calm. Normal affect.   Discharge Instructions  Discharge Instructions     Diet - low sodium heart healthy   Complete by: As directed    Diet Carb Modified   Complete by: As directed    Discharge wound care:   Complete by: As directed    Continue the [facility]  Physician Discharge Summary  Crete VOZ:[id number] DOB: Sep 23, 1945 DOA: 11/26/2020  PCP: Sherald Hess., MD  Admit date: 11/26/2020 Discharge date: 12/07/2020  Admitted From: Home Disposition: SNF  Recommendations for Outpatient Follow-up:  Follow ups as below. Please obtain CBC/BMP/Mag at follow up Please follow up on the following pending results: None    Discharge Condition: Stable CODE STATUS: Full code   Follow-up Information     Cory Minion, MD Follow up in 1 week(s).   Specialty: Orthopedic Surgery Contact information: [place] [address] [phone]                  Hospital Course: 75 year old M with PMH of DM-2, HTN and HLD presenting with progressive right ankle/foot pain for about a month that did not improve with p.o. 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  Inflammatory markers elevated.  Recent ABI with good arterial flow.  He was started on broad-spectrum IV antibiotics.  Orthopedic surgery consulted.  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  Cleared for discharge by orthopedic surgery with portable Prevena wound VAC.  Therapy recommended SNF.  See individual problem list below for more on hospital course.  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  Failed outpatient treatment with p.o. antibiotics twice.  MRI with cellulitis, myositis and acute osteomyelitis involving calcaneus.  Inflammatory markers elevated but improved. 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 -As needed Tylenol and oxycodone for pain -Appreciate Ortho recs-NWB on right leg, Prevena wound VAC for 1 week -Outpatient follow-up with orthopedic surgery in 1 week.   Uncontrolled IDDM-2 with hyperglycemia, hyperlipidemia and osteomyelitis: A1c 12.1%. Recent Labs   Lab 12/06/20 1144 12/06/20 1622 12/06/20 1951 12/06/20 2353 12/07/20 0722  GLUCAP 190* 301* 191* 125* 114*  -Discharged on home Lantus, metformin, Trulicity, Farxiga and statin. -Heart healthy and diabetic diet.   Acute renal failure, not POA: Could be iatrogenic from Zosyn and vancomycin.  Resolved.   Hyponatremia: Resolved.   Hypokalemia/hypophosphatemia: Resolved.   Essential hypertension: Normotensive. -Continue home Toprol-XL, Procardia, losartan/HCTZ   BPH without LUTS -Continue tamsulosin 0.4 mg p.o. nightly   Increased nutrient needs Body mass index is 29.29 kg/m. Nutrition Problem: Increased nutrient needs Etiology: wound healing Signs/Symptoms: estimated needs Interventions: Premier Protein, MVI, Prostat      Discharge Exam: Vitals:   12/06/20 2036 12/07/20 0522  BP: 126/69 130/86  Pulse: 83 84  Resp: 17 17  Temp: 98.3 F (36.8 C) 98.2 F (36.8 C)  SpO2: 100% 98%    GENERAL: No apparent distress.  Nontoxic. HEENT: MMM.  Vision and hearing grossly intact.  NECK: Supple.  No apparent JVD.  RESP: On RA.  No IWOB.  Fair aeration bilaterally. CVS:  RRR. Heart sounds normal.  ABD/GI/GU: Bowel sounds present. Soft. Non tender.  MSK/EXT:  Moves extremities. No apparent deformity. S/p right BKA.  Wound VAC and dressing in place. SKIN: Surgical RLE wound NEURO: Awake, alert and oriented appropriately.  No apparent focal neuro deficit. PSYCH: Calm. Normal affect.   Discharge Instructions  Discharge Instructions     Diet - low sodium heart healthy   Complete by: As directed    Diet Carb Modified   Complete by: As directed    Discharge wound care:   Complete by: As directed    Continue the [facility]  Great Toe154               0.96                  +---------+------------------+-----+---------+-------+ +-------+-----------+-----------+------------+------------+ ABI/TBIToday's ABIToday's TBIPrevious ABIPrevious TBI +-------+-----------+-----------+------------+------------+ Right  1.19       0.99                                +-------+-----------+-----------+------------+------------+ Left   1.15       0.96                                +-------+-----------+-----------+------------+------------+  The cuff was placed on the right calf due to ankle wound and pain.  Summary: Right: Resting right ankle-brachial index is within normal range. No evidence of significant right lower extremity arterial disease. The right toe-brachial index is normal. Left: Resting left ankle-brachial index is within normal range. No evidence of significant left lower extremity arterial disease. The left toe-brachial index is normal.  *See table(s) above for measurements and observations.  Electronically signed by Jamelle Haring on 11/27/2020 at 4:39:48 PM.    Final    VAS [place] LOWER EXTREMITY VENOUS (DVT)  Result Date:  11/14/2020  Lower Venous DVT Study Patient Name:  Cory [name]  Date of Exam:   11/13/2020 Medical Rec #: [id number]     Accession #:    [id number] Date of Birth: 24-Feb-1946     Patient Gender: M Patient Age:   74Y Exam Location:  [facility] Procedure:      VAS [place] LOWER EXTREMITY VENOUS (DVT) Referring Phys: [id number] BRIAN HAGLER --------------------------------------------------------------------------------  Indications: Swelling, and Pain.  Comparison Study: no prior Performing Technologist: Archie Patten RVS  Examination Guidelines: A complete evaluation includes B-mode imaging, spectral Doppler, color Doppler, and power Doppler as needed of all accessible portions of each vessel. Bilateral testing is considered an integral part of a complete examination. Limited examinations for reoccurring indications may be performed as noted. The reflux portion of the exam is performed with the patient in reverse Trendelenburg.  +---------+---------------+---------+-----------+----------+--------------+ RIGHT    CompressibilityPhasicitySpontaneityPropertiesThrombus Aging +---------+---------------+---------+-----------+----------+--------------+ CFV      Full           Yes      Yes                                 +---------+---------------+---------+-----------+----------+--------------+ SFJ      Full                                                        +---------+---------------+---------+-----------+----------+--------------+ FV Prox  Full                                                        +---------+---------------+---------+-----------+----------+--------------+ FV Mid   Full                                                        +---------+---------------+---------+-----------+----------+--------------+  By: Davina Poke D.O.   On: 11/27/2020 07:17   DG Chest Port 1 View  Result Date: 11/26/2020 CLINICAL DATA:  Foot infection EXAM: PORTABLE CHEST 1 VIEW COMPARISON:  None. FINDINGS: The heart size and mediastinal contours are within normal limits. Both lungs are clear. The visualized skeletal structures are unremarkable. IMPRESSION: No active disease. Electronically Signed   By: Donavan Foil M.D.   On: 11/26/2020 19:26   DG Foot Complete Right  Result Date: 11/12/2020 CLINICAL DATA:  Pain and  swelling. EXAM: RIGHT FOOT COMPLETE - 3+ VIEW COMPARISON:  No prior. FINDINGS: Diffuse soft tissue swelling. No radiopaque foreign body. No acute bony or joint abnormality. No evidence of fracture or dislocation. IMPRESSION: Diffuse soft tissue swelling. No radiopaque foreign body. No acute bony abnormality. Electronically Signed   By: Marcello Moores  Register   On: 11/12/2020 13:48   VAS [place] ABI WITH/WO TBI  Result Date: 11/27/2020  LOWER EXTREMITY DOPPLER STUDY Patient Name:  RAMI BUDHU  Date of Exam:   11/27/2020 Medical Rec #: [id number]     Accession #:    [id number] Date of Birth: 12-Aug-1945     Patient Gender: M Patient Age:   74Y Exam Location:  [facility] Procedure:      VAS [place] ABI WITH/WO TBI Referring Phys: [id number] JARED M GARDNER --------------------------------------------------------------------------------  Indications: Ulceration. High Risk Factors: Diabetes.  Limitations: Today's exam was limited due to an open wound and patient              intolerant to cuff pressure. Comparison Study: No prior studies. Performing Technologist: Carlos Levering RVT  Examination Guidelines: A complete evaluation includes at minimum, Doppler waveform signals and systolic blood pressure reading at the level of bilateral brachial, anterior tibial, and posterior tibial arteries, when vessel segments are accessible. Bilateral testing is considered an integral part of a complete examination. Photoelectric Plethysmograph (PPG) waveforms and toe systolic pressure readings are included as required and additional duplex testing as needed. Limited examinations for reoccurring indications may be performed as noted.  ABI Findings: +---------+------------------+-----+---------+--------+ Right    Rt Pressure (mmHg)IndexWaveform Comment  +---------+------------------+-----+---------+--------+ Brachial 161                    triphasic         +---------+------------------+-----+---------+--------+ PTA      187                1.16 triphasic         +---------+------------------+-----+---------+--------+ DP       191               1.19 triphasic         +---------+------------------+-----+---------+--------+ Great Toe160               0.99                   +---------+------------------+-----+---------+--------+ +---------+------------------+-----+---------+-------+ Left     Lt Pressure (mmHg)IndexWaveform Comment +---------+------------------+-----+---------+-------+ Brachial 147                    triphasic        +---------+------------------+-----+---------+-------+ PTA      185               1.15 triphasic        +---------+------------------+-----+---------+-------+ DP       182               1.13 triphasic        +---------+------------------+-----+---------+-------+  Great Toe154               0.96                  +---------+------------------+-----+---------+-------+ +-------+-----------+-----------+------------+------------+ ABI/TBIToday's ABIToday's TBIPrevious ABIPrevious TBI +-------+-----------+-----------+------------+------------+ Right  1.19       0.99                                +-------+-----------+-----------+------------+------------+ Left   1.15       0.96                                +-------+-----------+-----------+------------+------------+  The cuff was placed on the right calf due to ankle wound and pain.  Summary: Right: Resting right ankle-brachial index is within normal range. No evidence of significant right lower extremity arterial disease. The right toe-brachial index is normal. Left: Resting left ankle-brachial index is within normal range. No evidence of significant left lower extremity arterial disease. The left toe-brachial index is normal.  *See table(s) above for measurements and observations.  Electronically signed by Jamelle Haring on 11/27/2020 at 4:39:48 PM.    Final    VAS [place] LOWER EXTREMITY VENOUS (DVT)  Result Date:  11/14/2020  Lower Venous DVT Study Patient Name:  Cory [name]  Date of Exam:   11/13/2020 Medical Rec #: [id number]     Accession #:    [id number] Date of Birth: 24-Feb-1946     Patient Gender: M Patient Age:   74Y Exam Location:  [facility] Procedure:      VAS [place] LOWER EXTREMITY VENOUS (DVT) Referring Phys: [id number] BRIAN HAGLER --------------------------------------------------------------------------------  Indications: Swelling, and Pain.  Comparison Study: no prior Performing Technologist: Archie Patten RVS  Examination Guidelines: A complete evaluation includes B-mode imaging, spectral Doppler, color Doppler, and power Doppler as needed of all accessible portions of each vessel. Bilateral testing is considered an integral part of a complete examination. Limited examinations for reoccurring indications may be performed as noted. The reflux portion of the exam is performed with the patient in reverse Trendelenburg.  +---------+---------------+---------+-----------+----------+--------------+ RIGHT    CompressibilityPhasicitySpontaneityPropertiesThrombus Aging +---------+---------------+---------+-----------+----------+--------------+ CFV      Full           Yes      Yes                                 +---------+---------------+---------+-----------+----------+--------------+ SFJ      Full                                                        +---------+---------------+---------+-----------+----------+--------------+ FV Prox  Full                                                        +---------+---------------+---------+-----------+----------+--------------+ FV Mid   Full                                                        +---------+---------------+---------+-----------+----------+--------------+  Physician Discharge Summary  Crete VOZ:[id number] DOB: Sep 23, 1945 DOA: 11/26/2020  PCP: Sherald Hess., MD  Admit date: 11/26/2020 Discharge date: 12/07/2020  Admitted From: Home Disposition: SNF  Recommendations for Outpatient Follow-up:  Follow ups as below. Please obtain CBC/BMP/Mag at follow up Please follow up on the following pending results: None    Discharge Condition: Stable CODE STATUS: Full code   Follow-up Information     Cory Minion, MD Follow up in 1 week(s).   Specialty: Orthopedic Surgery Contact information: [place] [address] [phone]                  Hospital Course: 75 year old M with PMH of DM-2, HTN and HLD presenting with progressive right ankle/foot pain for about a month that did not improve with p.o. 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  Inflammatory markers elevated.  Recent ABI with good arterial flow.  He was started on broad-spectrum IV antibiotics.  Orthopedic surgery consulted.  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  Cleared for discharge by orthopedic surgery with portable Prevena wound VAC.  Therapy recommended SNF.  See individual problem list below for more on hospital course.  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  Failed outpatient treatment with p.o. antibiotics twice.  MRI with cellulitis, myositis and acute osteomyelitis involving calcaneus.  Inflammatory markers elevated but improved. 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 -As needed Tylenol and oxycodone for pain -Appreciate Ortho recs-NWB on right leg, Prevena wound VAC for 1 week -Outpatient follow-up with orthopedic surgery in 1 week.   Uncontrolled IDDM-2 with hyperglycemia, hyperlipidemia and osteomyelitis: A1c 12.1%. Recent Labs   Lab 12/06/20 1144 12/06/20 1622 12/06/20 1951 12/06/20 2353 12/07/20 0722  GLUCAP 190* 301* 191* 125* 114*  -Discharged on home Lantus, metformin, Trulicity, Farxiga and statin. -Heart healthy and diabetic diet.   Acute renal failure, not POA: Could be iatrogenic from Zosyn and vancomycin.  Resolved.   Hyponatremia: Resolved.   Hypokalemia/hypophosphatemia: Resolved.   Essential hypertension: Normotensive. -Continue home Toprol-XL, Procardia, losartan/HCTZ   BPH without LUTS -Continue tamsulosin 0.4 mg p.o. nightly   Increased nutrient needs Body mass index is 29.29 kg/m. Nutrition Problem: Increased nutrient needs Etiology: wound healing Signs/Symptoms: estimated needs Interventions: Premier Protein, MVI, Prostat      Discharge Exam: Vitals:   12/06/20 2036 12/07/20 0522  BP: 126/69 130/86  Pulse: 83 84  Resp: 17 17  Temp: 98.3 F (36.8 C) 98.2 F (36.8 C)  SpO2: 100% 98%    GENERAL: No apparent distress.  Nontoxic. HEENT: MMM.  Vision and hearing grossly intact.  NECK: Supple.  No apparent JVD.  RESP: On RA.  No IWOB.  Fair aeration bilaterally. CVS:  RRR. Heart sounds normal.  ABD/GI/GU: Bowel sounds present. Soft. Non tender.  MSK/EXT:  Moves extremities. No apparent deformity. S/p right BKA.  Wound VAC and dressing in place. SKIN: Surgical RLE wound NEURO: Awake, alert and oriented appropriately.  No apparent focal neuro deficit. PSYCH: Calm. Normal affect.   Discharge Instructions  Discharge Instructions     Diet - low sodium heart healthy   Complete by: As directed    Diet Carb Modified   Complete by: As directed    Discharge wound care:   Complete by: As directed    Continue the [facility]  Physician Discharge Summary  Crete VOZ:[id number] DOB: Sep 23, 1945 DOA: 11/26/2020  PCP: Sherald Hess., MD  Admit date: 11/26/2020 Discharge date: 12/07/2020  Admitted From: Home Disposition: SNF  Recommendations for Outpatient Follow-up:  Follow ups as below. Please obtain CBC/BMP/Mag at follow up Please follow up on the following pending results: None    Discharge Condition: Stable CODE STATUS: Full code   Follow-up Information     Cory Minion, MD Follow up in 1 week(s).   Specialty: Orthopedic Surgery Contact information: [place] [address] [phone]                  Hospital Course: 75 year old M with PMH of DM-2, HTN and HLD presenting with progressive right ankle/foot pain for about a month that did not improve with p.o. 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  Inflammatory markers elevated.  Recent ABI with good arterial flow.  He was started on broad-spectrum IV antibiotics.  Orthopedic surgery consulted.  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  Cleared for discharge by orthopedic surgery with portable Prevena wound VAC.  Therapy recommended SNF.  See individual problem list below for more on hospital course.  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  Failed outpatient treatment with p.o. antibiotics twice.  MRI with cellulitis, myositis and acute osteomyelitis involving calcaneus.  Inflammatory markers elevated but improved. 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 -As needed Tylenol and oxycodone for pain -Appreciate Ortho recs-NWB on right leg, Prevena wound VAC for 1 week -Outpatient follow-up with orthopedic surgery in 1 week.   Uncontrolled IDDM-2 with hyperglycemia, hyperlipidemia and osteomyelitis: A1c 12.1%. Recent Labs   Lab 12/06/20 1144 12/06/20 1622 12/06/20 1951 12/06/20 2353 12/07/20 0722  GLUCAP 190* 301* 191* 125* 114*  -Discharged on home Lantus, metformin, Trulicity, Farxiga and statin. -Heart healthy and diabetic diet.   Acute renal failure, not POA: Could be iatrogenic from Zosyn and vancomycin.  Resolved.   Hyponatremia: Resolved.   Hypokalemia/hypophosphatemia: Resolved.   Essential hypertension: Normotensive. -Continue home Toprol-XL, Procardia, losartan/HCTZ   BPH without LUTS -Continue tamsulosin 0.4 mg p.o. nightly   Increased nutrient needs Body mass index is 29.29 kg/m. Nutrition Problem: Increased nutrient needs Etiology: wound healing Signs/Symptoms: estimated needs Interventions: Premier Protein, MVI, Prostat      Discharge Exam: Vitals:   12/06/20 2036 12/07/20 0522  BP: 126/69 130/86  Pulse: 83 84  Resp: 17 17  Temp: 98.3 F (36.8 C) 98.2 F (36.8 C)  SpO2: 100% 98%    GENERAL: No apparent distress.  Nontoxic. HEENT: MMM.  Vision and hearing grossly intact.  NECK: Supple.  No apparent JVD.  RESP: On RA.  No IWOB.  Fair aeration bilaterally. CVS:  RRR. Heart sounds normal.  ABD/GI/GU: Bowel sounds present. Soft. Non tender.  MSK/EXT:  Moves extremities. No apparent deformity. S/p right BKA.  Wound VAC and dressing in place. SKIN: Surgical RLE wound NEURO: Awake, alert and oriented appropriately.  No apparent focal neuro deficit. PSYCH: Calm. Normal affect.   Discharge Instructions  Discharge Instructions     Diet - low sodium heart healthy   Complete by: As directed    Diet Carb Modified   Complete by: As directed    Discharge wound care:   Complete by: As directed    Continue the [facility]  Physician Discharge Summary  Crete VOZ:[id number] DOB: Sep 23, 1945 DOA: 11/26/2020  PCP: Sherald Hess., MD  Admit date: 11/26/2020 Discharge date: 12/07/2020  Admitted From: Home Disposition: SNF  Recommendations for Outpatient Follow-up:  Follow ups as below. Please obtain CBC/BMP/Mag at follow up Please follow up on the following pending results: None    Discharge Condition: Stable CODE STATUS: Full code   Follow-up Information     Cory Minion, MD Follow up in 1 week(s).   Specialty: Orthopedic Surgery Contact information: [place] [address] [phone]                  Hospital Course: 75 year old M with PMH of DM-2, HTN and HLD presenting with progressive right ankle/foot pain for about a month that did not improve with p.o. 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  Inflammatory markers elevated.  Recent ABI with good arterial flow.  He was started on broad-spectrum IV antibiotics.  Orthopedic surgery consulted.  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  Cleared for discharge by orthopedic surgery with portable Prevena wound VAC.  Therapy recommended SNF.  See individual problem list below for more on hospital course.  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  Failed outpatient treatment with p.o. antibiotics twice.  MRI with cellulitis, myositis and acute osteomyelitis involving calcaneus.  Inflammatory markers elevated but improved. 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 -As needed Tylenol and oxycodone for pain -Appreciate Ortho recs-NWB on right leg, Prevena wound VAC for 1 week -Outpatient follow-up with orthopedic surgery in 1 week.   Uncontrolled IDDM-2 with hyperglycemia, hyperlipidemia and osteomyelitis: A1c 12.1%. Recent Labs   Lab 12/06/20 1144 12/06/20 1622 12/06/20 1951 12/06/20 2353 12/07/20 0722  GLUCAP 190* 301* 191* 125* 114*  -Discharged on home Lantus, metformin, Trulicity, Farxiga and statin. -Heart healthy and diabetic diet.   Acute renal failure, not POA: Could be iatrogenic from Zosyn and vancomycin.  Resolved.   Hyponatremia: Resolved.   Hypokalemia/hypophosphatemia: Resolved.   Essential hypertension: Normotensive. -Continue home Toprol-XL, Procardia, losartan/HCTZ   BPH without LUTS -Continue tamsulosin 0.4 mg p.o. nightly   Increased nutrient needs Body mass index is 29.29 kg/m. Nutrition Problem: Increased nutrient needs Etiology: wound healing Signs/Symptoms: estimated needs Interventions: Premier Protein, MVI, Prostat      Discharge Exam: Vitals:   12/06/20 2036 12/07/20 0522  BP: 126/69 130/86  Pulse: 83 84  Resp: 17 17  Temp: 98.3 F (36.8 C) 98.2 F (36.8 C)  SpO2: 100% 98%    GENERAL: No apparent distress.  Nontoxic. HEENT: MMM.  Vision and hearing grossly intact.  NECK: Supple.  No apparent JVD.  RESP: On RA.  No IWOB.  Fair aeration bilaterally. CVS:  RRR. Heart sounds normal.  ABD/GI/GU: Bowel sounds present. Soft. Non tender.  MSK/EXT:  Moves extremities. No apparent deformity. S/p right BKA.  Wound VAC and dressing in place. SKIN: Surgical RLE wound NEURO: Awake, alert and oriented appropriately.  No apparent focal neuro deficit. PSYCH: Calm. Normal affect.   Discharge Instructions  Discharge Instructions     Diet - low sodium heart healthy   Complete by: As directed    Diet Carb Modified   Complete by: As directed    Discharge wound care:   Complete by: As directed    Continue the [facility]

## 2020-12-07 NOTE — Care Management Important Message (Signed)
Important Message  Patient Details IM Letter given to the Patient. Name: Zackory Pudlo MRN: [id number] Date of Birth: [birth year]   Medicare Important Message Given:  Yes     Caren Macadam 12/07/2020, 2:10 PM

## 2020-12-07 NOTE — Progress Notes (Signed)
Patient ID: Cory [name], male   DOB: 11/12/1945, 75 y.o.   MRN: [id number] Patient is postoperative day 3 transtibial amputation.  Patient has no complaints this morning the wound VAC has a good suction fit.  I plugged in the portable Praveena pump to charge it for discharge.  Patient is safe for discharge from an orthopedic standpoint at this time.  Patient will discharge with the portable Praveena wound VAC pump and continue the Praveena dressing for 1 week.

## 2020-12-07 NOTE — TOC Progression Note (Addendum)
Transition of Care [facility]) - Progression Note   Patient Details  Name: Cory [name] MRN: [id number] Date of Birth: 21-May-1945  [facility] of [facility]) CM/SW Contact  Ewing Schlein, LCSW Phone Number: 12/07/2020, 1:07 PM  Clinical Narrative: [facility] made a bed offer, but cannot accept the patient until tomorrow pending insurance authorization, a negative COVID test, and his COVID vaccine record. Patient is agreeable to [facility], but his COVID vaccine card is at home. CSW left voicemail for his niece, Lajean Saver, requesting assistance with getting the vaccine record.  CSW started insurance authorization on [employer] portal. Clinicals uploaded for review. Reference ID # is: [id number]. TOC awaiting completion of insurance authorization and a negative COVID test.  Addendum: SNF was approved. Plan Auth ID is: [id number]. Start date is 12/07/20 and the next review date is 12/09/20.  Expected Discharge Plan: Skilled Nursing Facility Barriers to Discharge: Continued Medical Work up  Expected Discharge Plan and Services Expected Discharge Plan: Skilled Nursing Facility In-house Referral: Clinical Social Work Post Acute Care Choice: Skilled Nursing Facility Living arrangements for the past 2 months: Single Family Home Expected Discharge Date: 12/07/20               DME Arranged: N/A DME Agency: NA  Readmission Risk Interventions No flowsheet data found.

## 2020-12-07 NOTE — Plan of Care (Signed)
  Problem: Safety: Goal: Ability to remain free from injury will improve Outcome: Progressing   

## 2020-12-08 DIAGNOSIS — A419 Sepsis, unspecified organism: Secondary | ICD-10-CM | POA: Diagnosis not present

## 2020-12-08 DIAGNOSIS — M86071 Acute hematogenous osteomyelitis, right ankle and foot: Secondary | ICD-10-CM | POA: Diagnosis not present

## 2020-12-08 DIAGNOSIS — E1165 Type 2 diabetes mellitus with hyperglycemia: Secondary | ICD-10-CM | POA: Diagnosis not present

## 2020-12-08 DIAGNOSIS — E785 Hyperlipidemia, unspecified: Secondary | ICD-10-CM | POA: Diagnosis not present

## 2020-12-08 LAB — GLUCOSE, CAPILLARY
Glucose-Capillary: 79 mg/dL (ref 70–99)
Glucose-Capillary: 191 mg/dL — ABNORMAL HIGH (ref 70–99)
Glucose-Capillary: 233 mg/dL — ABNORMAL HIGH (ref 70–99)

## 2020-12-08 LAB — SURGICAL PATHOLOGY

## 2020-12-08 NOTE — Discharge Summary (Signed)
peroneal tendon sheaths, which may be infectious. 4. Tendinosis versus reactive changes along the lateral aspect of the distal Achilles tendon. 5. Diffuse intramuscular edema within the intrinsic foot musculature suggesting myositis. These results will be called to the ordering clinician or representative by the Radiologist Assistant, and communication documented in the PACS or [employer]. Electronically Signed   By: Davina Poke D.O.   On: 11/27/2020 07:17   DG Chest Port 1 View  Result Date: 11/26/2020 CLINICAL DATA:  Foot infection EXAM: PORTABLE CHEST 1 VIEW COMPARISON:  None. FINDINGS: The heart size and mediastinal contours are within  normal limits. Both lungs are clear. The visualized skeletal structures are unremarkable. IMPRESSION: No active disease. Electronically Signed   By: Donavan Foil M.D.   On: 11/26/2020 19:26   DG Foot Complete Right  Result Date: 11/12/2020 CLINICAL DATA:  Pain and swelling. EXAM: RIGHT FOOT COMPLETE - 3+ VIEW COMPARISON:  No prior. FINDINGS: Diffuse soft tissue swelling. No radiopaque foreign body. No acute bony or joint abnormality. No evidence of fracture or dislocation. IMPRESSION: Diffuse soft tissue swelling. No radiopaque foreign body. No acute bony abnormality. Electronically Signed   By: Marcello Moores  Register   On: 11/12/2020 13:48   VAS [place] ABI WITH/WO TBI  Result Date: 11/27/2020  LOWER EXTREMITY DOPPLER STUDY Patient Name:  Cory [name]  Date of Exam:   11/27/2020 Medical Rec #: [id number]     Accession #:    [id number] Date of Birth: [birth year]     Patient Gender: M Patient Age:   75Y Exam Location:  [facility] Procedure:      VAS [place] ABI WITH/WO TBI Referring Phys: [id number] JARED M GARDNER --------------------------------------------------------------------------------  Indications: Ulceration. High Risk Factors: Diabetes.  Limitations: Today's exam was limited due to an open wound and patient              intolerant to cuff pressure. Comparison Study: No prior studies. Performing Technologist: Carlos Levering RVT  Examination Guidelines: A complete evaluation includes at minimum, Doppler waveform signals and systolic blood pressure reading at the level of bilateral brachial, anterior tibial, and posterior tibial arteries, when vessel segments are accessible. Bilateral testing is considered an integral part of a complete examination. Photoelectric Plethysmograph (PPG) waveforms and toe systolic pressure readings are included as required and additional duplex testing as needed. Limited examinations for reoccurring indications may be performed as noted.  ABI Findings:  +---------+------------------+-----+---------+--------+ Right    Rt Pressure (mmHg)IndexWaveform Comment  +---------+------------------+-----+---------+--------+ Brachial 161                    triphasic         +---------+------------------+-----+---------+--------+ PTA      187               1.16 triphasic         +---------+------------------+-----+---------+--------+ DP       191               1.19 triphasic         +---------+------------------+-----+---------+--------+ Great Toe160               0.99                   +---------+------------------+-----+---------+--------+ +---------+------------------+-----+---------+-------+ Left     Lt Pressure (mmHg)IndexWaveform Comment +---------+------------------+-----+---------+-------+ Brachial 147                    triphasic        +---------+------------------+-----+---------+-------+ PTA  peroneal tendon sheaths, which may be infectious. 4. Tendinosis versus reactive changes along the lateral aspect of the distal Achilles tendon. 5. Diffuse intramuscular edema within the intrinsic foot musculature suggesting myositis. These results will be called to the ordering clinician or representative by the Radiologist Assistant, and communication documented in the PACS or [employer]. Electronically Signed   By: Davina Poke D.O.   On: 11/27/2020 07:17   DG Chest Port 1 View  Result Date: 11/26/2020 CLINICAL DATA:  Foot infection EXAM: PORTABLE CHEST 1 VIEW COMPARISON:  None. FINDINGS: The heart size and mediastinal contours are within  normal limits. Both lungs are clear. The visualized skeletal structures are unremarkable. IMPRESSION: No active disease. Electronically Signed   By: Donavan Foil M.D.   On: 11/26/2020 19:26   DG Foot Complete Right  Result Date: 11/12/2020 CLINICAL DATA:  Pain and swelling. EXAM: RIGHT FOOT COMPLETE - 3+ VIEW COMPARISON:  No prior. FINDINGS: Diffuse soft tissue swelling. No radiopaque foreign body. No acute bony or joint abnormality. No evidence of fracture or dislocation. IMPRESSION: Diffuse soft tissue swelling. No radiopaque foreign body. No acute bony abnormality. Electronically Signed   By: Marcello Moores  Register   On: 11/12/2020 13:48   VAS [place] ABI WITH/WO TBI  Result Date: 11/27/2020  LOWER EXTREMITY DOPPLER STUDY Patient Name:  Cory [name]  Date of Exam:   11/27/2020 Medical Rec #: [id number]     Accession #:    [id number] Date of Birth: [birth year]     Patient Gender: M Patient Age:   75Y Exam Location:  [facility] Procedure:      VAS [place] ABI WITH/WO TBI Referring Phys: [id number] JARED M GARDNER --------------------------------------------------------------------------------  Indications: Ulceration. High Risk Factors: Diabetes.  Limitations: Today's exam was limited due to an open wound and patient              intolerant to cuff pressure. Comparison Study: No prior studies. Performing Technologist: Carlos Levering RVT  Examination Guidelines: A complete evaluation includes at minimum, Doppler waveform signals and systolic blood pressure reading at the level of bilateral brachial, anterior tibial, and posterior tibial arteries, when vessel segments are accessible. Bilateral testing is considered an integral part of a complete examination. Photoelectric Plethysmograph (PPG) waveforms and toe systolic pressure readings are included as required and additional duplex testing as needed. Limited examinations for reoccurring indications may be performed as noted.  ABI Findings:  +---------+------------------+-----+---------+--------+ Right    Rt Pressure (mmHg)IndexWaveform Comment  +---------+------------------+-----+---------+--------+ Brachial 161                    triphasic         +---------+------------------+-----+---------+--------+ PTA      187               1.16 triphasic         +---------+------------------+-----+---------+--------+ DP       191               1.19 triphasic         +---------+------------------+-----+---------+--------+ Great Toe160               0.99                   +---------+------------------+-----+---------+--------+ +---------+------------------+-----+---------+-------+ Left     Lt Pressure (mmHg)IndexWaveform Comment +---------+------------------+-----+---------+-------+ Brachial 147                    triphasic        +---------+------------------+-----+---------+-------+ PTA  185               1.15 triphasic        +---------+------------------+-----+---------+-------+ DP       182               1.13 triphasic        +---------+------------------+-----+---------+-------+ Great Toe154               0.96                  +---------+------------------+-----+---------+-------+ +-------+-----------+-----------+------------+------------+ ABI/TBIToday's ABIToday's TBIPrevious ABIPrevious TBI +-------+-----------+-----------+------------+------------+ Right  1.19       0.99                                +-------+-----------+-----------+------------+------------+ Left   1.15       0.96                                +-------+-----------+-----------+------------+------------+  The cuff was placed on the right calf due to ankle wound and pain.  Summary: Right: Resting right ankle-brachial index is within normal range. No evidence of significant right lower extremity arterial disease. The right toe-brachial index is normal. Left: Resting left ankle-brachial index is within normal  range. No evidence of significant left lower extremity arterial disease. The left toe-brachial index is normal.  *See table(s) above for measurements and observations.  Electronically signed by Jamelle Haring on 11/27/2020 at 4:39:48 PM.    Final    VAS [place] LOWER EXTREMITY VENOUS (DVT)  Result Date: 11/14/2020  Lower Venous DVT Study Patient Name:  Cory [name]  Date of Exam:   11/13/2020 Medical Rec #: [id number]     Accession #:    [id number] Date of Birth: [birth year]     Patient Gender: M Patient Age:   75Y Exam Location:  [facility] Procedure:      VAS [place] LOWER EXTREMITY VENOUS (DVT) Referring Phys: [id number] BRIAN HAGLER --------------------------------------------------------------------------------  Indications: Swelling, and Pain.  Comparison Study: no prior Performing Technologist: Archie Patten RVS  Examination Guidelines: A complete evaluation includes B-mode imaging, spectral Doppler, color Doppler, and power Doppler as needed of all accessible portions of each vessel. Bilateral testing is considered an integral part of a complete examination. Limited examinations for reoccurring indications may be performed as noted. The reflux portion of the exam is performed with the patient in reverse Trendelenburg.  +---------+---------------+---------+-----------+----------+--------------+ RIGHT    CompressibilityPhasicitySpontaneityPropertiesThrombus Aging +---------+---------------+---------+-----------+----------+--------------+ CFV      Full           Yes      Yes                                 +---------+---------------+---------+-----------+----------+--------------+ SFJ      Full                                                        +---------+---------------+---------+-----------+----------+--------------+ FV Prox  Full                                                        +---------+---------------+---------+-----------+----------+--------------+  185               1.15 triphasic        +---------+------------------+-----+---------+-------+ DP       182               1.13 triphasic        +---------+------------------+-----+---------+-------+ Great Toe154               0.96                  +---------+------------------+-----+---------+-------+ +-------+-----------+-----------+------------+------------+ ABI/TBIToday's ABIToday's TBIPrevious ABIPrevious TBI +-------+-----------+-----------+------------+------------+ Right  1.19       0.99                                +-------+-----------+-----------+------------+------------+ Left   1.15       0.96                                +-------+-----------+-----------+------------+------------+  The cuff was placed on the right calf due to ankle wound and pain.  Summary: Right: Resting right ankle-brachial index is within normal range. No evidence of significant right lower extremity arterial disease. The right toe-brachial index is normal. Left: Resting left ankle-brachial index is within normal  range. No evidence of significant left lower extremity arterial disease. The left toe-brachial index is normal.  *See table(s) above for measurements and observations.  Electronically signed by Jamelle Haring on 11/27/2020 at 4:39:48 PM.    Final    VAS [place] LOWER EXTREMITY VENOUS (DVT)  Result Date: 11/14/2020  Lower Venous DVT Study Patient Name:  Cory [name]  Date of Exam:   11/13/2020 Medical Rec #: [id number]     Accession #:    [id number] Date of Birth: [birth year]     Patient Gender: M Patient Age:   75Y Exam Location:  [facility] Procedure:      VAS [place] LOWER EXTREMITY VENOUS (DVT) Referring Phys: [id number] BRIAN HAGLER --------------------------------------------------------------------------------  Indications: Swelling, and Pain.  Comparison Study: no prior Performing Technologist: Archie Patten RVS  Examination Guidelines: A complete evaluation includes B-mode imaging, spectral Doppler, color Doppler, and power Doppler as needed of all accessible portions of each vessel. Bilateral testing is considered an integral part of a complete examination. Limited examinations for reoccurring indications may be performed as noted. The reflux portion of the exam is performed with the patient in reverse Trendelenburg.  +---------+---------------+---------+-----------+----------+--------------+ RIGHT    CompressibilityPhasicitySpontaneityPropertiesThrombus Aging +---------+---------------+---------+-----------+----------+--------------+ CFV      Full           Yes      Yes                                 +---------+---------------+---------+-----------+----------+--------------+ SFJ      Full                                                        +---------+---------------+---------+-----------+----------+--------------+ FV Prox  Full                                                        +---------+---------------+---------+-----------+----------+--------------+  Physician Discharge Summary  Mamou UUE:[id number] DOB: June 20, 1945 DOA: 11/26/2020  PCP: Sherald Hess., MD  Admit date: 11/26/2020 Discharge date: 12/08/2020  Admitted From: Home Disposition: SNF  Recommendations for Outpatient Follow-up:  Follow ups as below. Please obtain CBC/BMP/Mag at follow up Please follow up on the following pending results: None    Discharge Condition: Stable CODE STATUS: Full code   Contact information for follow-up providers     [name], MD Follow up in 1 week(s).   Specialty: Orthopedic Surgery Contact information: [place] [address] [phone]              Contact information for after-discharge care     Destination     [facility] Preferred SNF .   Service: Skilled Nursing Contact information: [place] [facility] Course: 74 year old M with PMH of DM-2, HTN and HLD presenting with progressive right ankle/foot pain for about a month that did not improve with p.o. 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  Inflammatory markers elevated.  Recent ABI with good arterial flow.  He was started on broad-spectrum IV antibiotics.  Orthopedic surgery consulted.  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  Cleared for discharge by orthopedic surgery with portable Prevena wound VAC.  Therapy recommended SNF.  See individual problem list below for more on hospital course.  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  Failed outpatient treatment with p.o. antibiotics twice.  MRI with cellulitis, myositis and acute osteomyelitis involving calcaneus.  Inflammatory markers elevated but improved. 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 -As needed Tylenol and oxycodone for pain -Appreciate Ortho recs-NWB on right leg, Prevena wound VAC for 1 week -Outpatient follow-up with orthopedic surgery in 1 week.   Uncontrolled IDDM-2 with hyperglycemia, hyperlipidemia and osteomyelitis: A1c 12.1%. Recent Labs  Lab 12/07/20 0722 12/07/20 1202 12/07/20 1627 12/07/20 2128 12/08/20 0732  GLUCAP 114* 158* 226* 126* 79  -Discharged on home Lantus, metformin, Trulicity, Farxiga and statin. -Heart healthy and diabetic diet.   Acute renal failure, not POA: Could be iatrogenic from Zosyn and vancomycin.  Resolved.   Hyponatremia: Resolved.   Hypokalemia/hypophosphatemia: Resolved.   Essential hypertension: Normotensive. -Continue home Toprol-XL, Procardia, losartan/HCTZ   BPH without LUTS -Continue tamsulosin 0.4 mg p.o. nightly   Increased nutrient needs Body mass index is 29.29 kg/m. Nutrition Problem: Increased nutrient needs Etiology: wound healing Signs/Symptoms: estimated needs Interventions: Premier Protein, MVI, Prostat      Discharge Exam: Vitals:   12/07/20 2129 12/08/20 0519  BP: (!) 145/81 130/73  Pulse: 88 88  Resp: 16 18  Temp: 98.1 F (36.7 C) 97.9 F (36.6 C)  SpO2: 100% 98%    GENERAL: No apparent distress.  Nontoxic. HEENT: MMM.  Vision and hearing grossly intact.  NECK: Supple.  No apparent JVD.  RESP: On RA.  No IWOB.  Fair aeration bilaterally. CVS:  RRR. Heart sounds normal.  ABD/GI/GU: Bowel sounds present. Soft. Non tender.  MSK/EXT:  Moves extremities. No apparent deformity. S/p right BKA.  Wound VAC and dressing in place. SKIN: Surgical RLE wound NEURO: Awake, alert and oriented appropriately.  No apparent focal neuro  peroneal tendon sheaths, which may be infectious. 4. Tendinosis versus reactive changes along the lateral aspect of the distal Achilles tendon. 5. Diffuse intramuscular edema within the intrinsic foot musculature suggesting myositis. These results will be called to the ordering clinician or representative by the Radiologist Assistant, and communication documented in the PACS or [employer]. Electronically Signed   By: Davina Poke D.O.   On: 11/27/2020 07:17   DG Chest Port 1 View  Result Date: 11/26/2020 CLINICAL DATA:  Foot infection EXAM: PORTABLE CHEST 1 VIEW COMPARISON:  None. FINDINGS: The heart size and mediastinal contours are within  normal limits. Both lungs are clear. The visualized skeletal structures are unremarkable. IMPRESSION: No active disease. Electronically Signed   By: Donavan Foil M.D.   On: 11/26/2020 19:26   DG Foot Complete Right  Result Date: 11/12/2020 CLINICAL DATA:  Pain and swelling. EXAM: RIGHT FOOT COMPLETE - 3+ VIEW COMPARISON:  No prior. FINDINGS: Diffuse soft tissue swelling. No radiopaque foreign body. No acute bony or joint abnormality. No evidence of fracture or dislocation. IMPRESSION: Diffuse soft tissue swelling. No radiopaque foreign body. No acute bony abnormality. Electronically Signed   By: Marcello Moores  Register   On: 11/12/2020 13:48   VAS [place] ABI WITH/WO TBI  Result Date: 11/27/2020  LOWER EXTREMITY DOPPLER STUDY Patient Name:  Cory [name]  Date of Exam:   11/27/2020 Medical Rec #: [id number]     Accession #:    [id number] Date of Birth: [birth year]     Patient Gender: M Patient Age:   75Y Exam Location:  [facility] Procedure:      VAS [place] ABI WITH/WO TBI Referring Phys: [id number] JARED M GARDNER --------------------------------------------------------------------------------  Indications: Ulceration. High Risk Factors: Diabetes.  Limitations: Today's exam was limited due to an open wound and patient              intolerant to cuff pressure. Comparison Study: No prior studies. Performing Technologist: Carlos Levering RVT  Examination Guidelines: A complete evaluation includes at minimum, Doppler waveform signals and systolic blood pressure reading at the level of bilateral brachial, anterior tibial, and posterior tibial arteries, when vessel segments are accessible. Bilateral testing is considered an integral part of a complete examination. Photoelectric Plethysmograph (PPG) waveforms and toe systolic pressure readings are included as required and additional duplex testing as needed. Limited examinations for reoccurring indications may be performed as noted.  ABI Findings:  +---------+------------------+-----+---------+--------+ Right    Rt Pressure (mmHg)IndexWaveform Comment  +---------+------------------+-----+---------+--------+ Brachial 161                    triphasic         +---------+------------------+-----+---------+--------+ PTA      187               1.16 triphasic         +---------+------------------+-----+---------+--------+ DP       191               1.19 triphasic         +---------+------------------+-----+---------+--------+ Great Toe160               0.99                   +---------+------------------+-----+---------+--------+ +---------+------------------+-----+---------+-------+ Left     Lt Pressure (mmHg)IndexWaveform Comment +---------+------------------+-----+---------+-------+ Brachial 147                    triphasic        +---------+------------------+-----+---------+-------+ PTA  peroneal tendon sheaths, which may be infectious. 4. Tendinosis versus reactive changes along the lateral aspect of the distal Achilles tendon. 5. Diffuse intramuscular edema within the intrinsic foot musculature suggesting myositis. These results will be called to the ordering clinician or representative by the Radiologist Assistant, and communication documented in the PACS or [employer]. Electronically Signed   By: Davina Poke D.O.   On: 11/27/2020 07:17   DG Chest Port 1 View  Result Date: 11/26/2020 CLINICAL DATA:  Foot infection EXAM: PORTABLE CHEST 1 VIEW COMPARISON:  None. FINDINGS: The heart size and mediastinal contours are within  normal limits. Both lungs are clear. The visualized skeletal structures are unremarkable. IMPRESSION: No active disease. Electronically Signed   By: Donavan Foil M.D.   On: 11/26/2020 19:26   DG Foot Complete Right  Result Date: 11/12/2020 CLINICAL DATA:  Pain and swelling. EXAM: RIGHT FOOT COMPLETE - 3+ VIEW COMPARISON:  No prior. FINDINGS: Diffuse soft tissue swelling. No radiopaque foreign body. No acute bony or joint abnormality. No evidence of fracture or dislocation. IMPRESSION: Diffuse soft tissue swelling. No radiopaque foreign body. No acute bony abnormality. Electronically Signed   By: Marcello Moores  Register   On: 11/12/2020 13:48   VAS [place] ABI WITH/WO TBI  Result Date: 11/27/2020  LOWER EXTREMITY DOPPLER STUDY Patient Name:  Cory [name]  Date of Exam:   11/27/2020 Medical Rec #: [id number]     Accession #:    [id number] Date of Birth: [birth year]     Patient Gender: M Patient Age:   75Y Exam Location:  [facility] Procedure:      VAS [place] ABI WITH/WO TBI Referring Phys: [id number] JARED M GARDNER --------------------------------------------------------------------------------  Indications: Ulceration. High Risk Factors: Diabetes.  Limitations: Today's exam was limited due to an open wound and patient              intolerant to cuff pressure. Comparison Study: No prior studies. Performing Technologist: Carlos Levering RVT  Examination Guidelines: A complete evaluation includes at minimum, Doppler waveform signals and systolic blood pressure reading at the level of bilateral brachial, anterior tibial, and posterior tibial arteries, when vessel segments are accessible. Bilateral testing is considered an integral part of a complete examination. Photoelectric Plethysmograph (PPG) waveforms and toe systolic pressure readings are included as required and additional duplex testing as needed. Limited examinations for reoccurring indications may be performed as noted.  ABI Findings:  +---------+------------------+-----+---------+--------+ Right    Rt Pressure (mmHg)IndexWaveform Comment  +---------+------------------+-----+---------+--------+ Brachial 161                    triphasic         +---------+------------------+-----+---------+--------+ PTA      187               1.16 triphasic         +---------+------------------+-----+---------+--------+ DP       191               1.19 triphasic         +---------+------------------+-----+---------+--------+ Great Toe160               0.99                   +---------+------------------+-----+---------+--------+ +---------+------------------+-----+---------+-------+ Left     Lt Pressure (mmHg)IndexWaveform Comment +---------+------------------+-----+---------+-------+ Brachial 147                    triphasic        +---------+------------------+-----+---------+-------+ PTA  185               1.15 triphasic        +---------+------------------+-----+---------+-------+ DP       182               1.13 triphasic        +---------+------------------+-----+---------+-------+ Great Toe154               0.96                  +---------+------------------+-----+---------+-------+ +-------+-----------+-----------+------------+------------+ ABI/TBIToday's ABIToday's TBIPrevious ABIPrevious TBI +-------+-----------+-----------+------------+------------+ Right  1.19       0.99                                +-------+-----------+-----------+------------+------------+ Left   1.15       0.96                                +-------+-----------+-----------+------------+------------+  The cuff was placed on the right calf due to ankle wound and pain.  Summary: Right: Resting right ankle-brachial index is within normal range. No evidence of significant right lower extremity arterial disease. The right toe-brachial index is normal. Left: Resting left ankle-brachial index is within normal  range. No evidence of significant left lower extremity arterial disease. The left toe-brachial index is normal.  *See table(s) above for measurements and observations.  Electronically signed by Jamelle Haring on 11/27/2020 at 4:39:48 PM.    Final    VAS [place] LOWER EXTREMITY VENOUS (DVT)  Result Date: 11/14/2020  Lower Venous DVT Study Patient Name:  Cory [name]  Date of Exam:   11/13/2020 Medical Rec #: [id number]     Accession #:    [id number] Date of Birth: [birth year]     Patient Gender: M Patient Age:   75Y Exam Location:  [facility] Procedure:      VAS [place] LOWER EXTREMITY VENOUS (DVT) Referring Phys: [id number] BRIAN HAGLER --------------------------------------------------------------------------------  Indications: Swelling, and Pain.  Comparison Study: no prior Performing Technologist: Archie Patten RVS  Examination Guidelines: A complete evaluation includes B-mode imaging, spectral Doppler, color Doppler, and power Doppler as needed of all accessible portions of each vessel. Bilateral testing is considered an integral part of a complete examination. Limited examinations for reoccurring indications may be performed as noted. The reflux portion of the exam is performed with the patient in reverse Trendelenburg.  +---------+---------------+---------+-----------+----------+--------------+ RIGHT    CompressibilityPhasicitySpontaneityPropertiesThrombus Aging +---------+---------------+---------+-----------+----------+--------------+ CFV      Full           Yes      Yes                                 +---------+---------------+---------+-----------+----------+--------------+ SFJ      Full                                                        +---------+---------------+---------+-----------+----------+--------------+ FV Prox  Full                                                        +---------+---------------+---------+-----------+----------+--------------+  185               1.15 triphasic        +---------+------------------+-----+---------+-------+ DP       182               1.13 triphasic        +---------+------------------+-----+---------+-------+ Great Toe154               0.96                  +---------+------------------+-----+---------+-------+ +-------+-----------+-----------+------------+------------+ ABI/TBIToday's ABIToday's TBIPrevious ABIPrevious TBI +-------+-----------+-----------+------------+------------+ Right  1.19       0.99                                +-------+-----------+-----------+------------+------------+ Left   1.15       0.96                                +-------+-----------+-----------+------------+------------+  The cuff was placed on the right calf due to ankle wound and pain.  Summary: Right: Resting right ankle-brachial index is within normal range. No evidence of significant right lower extremity arterial disease. The right toe-brachial index is normal. Left: Resting left ankle-brachial index is within normal  range. No evidence of significant left lower extremity arterial disease. The left toe-brachial index is normal.  *See table(s) above for measurements and observations.  Electronically signed by Jamelle Haring on 11/27/2020 at 4:39:48 PM.    Final    VAS [place] LOWER EXTREMITY VENOUS (DVT)  Result Date: 11/14/2020  Lower Venous DVT Study Patient Name:  Cory [name]  Date of Exam:   11/13/2020 Medical Rec #: [id number]     Accession #:    [id number] Date of Birth: [birth year]     Patient Gender: M Patient Age:   75Y Exam Location:  [facility] Procedure:      VAS [place] LOWER EXTREMITY VENOUS (DVT) Referring Phys: [id number] BRIAN HAGLER --------------------------------------------------------------------------------  Indications: Swelling, and Pain.  Comparison Study: no prior Performing Technologist: Archie Patten RVS  Examination Guidelines: A complete evaluation includes B-mode imaging, spectral Doppler, color Doppler, and power Doppler as needed of all accessible portions of each vessel. Bilateral testing is considered an integral part of a complete examination. Limited examinations for reoccurring indications may be performed as noted. The reflux portion of the exam is performed with the patient in reverse Trendelenburg.  +---------+---------------+---------+-----------+----------+--------------+ RIGHT    CompressibilityPhasicitySpontaneityPropertiesThrombus Aging +---------+---------------+---------+-----------+----------+--------------+ CFV      Full           Yes      Yes                                 +---------+---------------+---------+-----------+----------+--------------+ SFJ      Full                                                        +---------+---------------+---------+-----------+----------+--------------+ FV Prox  Full                                                        +---------+---------------+---------+-----------+----------+--------------+

## 2020-12-08 NOTE — Progress Notes (Signed)
OT Cancellation Note  Patient Details Name: Shaine Newmark MRN: [id number] DOB: Oct 14, 1945   Cancelled Treatment:    Reason Eval/Treat Not Completed: Other (comment) patient was resting in bed. Patient reportd he was d/c today to SNF and asked to continue to rest until transport. Will follow up tomorrow with patient if still here.  Sharyn Blitz OTR/L, MS Acute Rehabilitation Department Office# [phone] Pager# [phone]   Chalmers Guest Sherian Valenza 12/08/2020, 2:04 PM

## 2020-12-08 NOTE — TOC Transition Note (Signed)
Transition of Care [facility]) - CM/SW Discharge Note   Patient Details  Name: Cory [name] MRN: [id number] Date of Birth: 17-Jun-1945  Transition of Care [facility]) CM/SW Contact:  Amada Jupiter, LCSW Phone Number: 12/08/2020, 2:52 PM   Clinical Narrative:    Pt medically cleared for dc to [facility] today.  Insurance auth received.  PTAR called at 2:45pm.  RN to call report to [phone].  No further needs.   Final next level of care: Skilled Nursing Facility Barriers to Discharge: Barriers Resolved   Patient Goals and CMS Choice Patient states their goals for this hospitalization and ongoing recovery are:: to eventually return to home after SNF rehab CMS Medicare.gov Compare Post Acute Care list provided to:: Other (Comment Required) (pt's neice)    Discharge Placement PASRR number recieved: 02/05/21            Patient chooses bed at: [facility] Patient to be transferred to facility by: PTAR Name of family member notified: niece, [name] Patient and family notified of of transfer: 12/08/20  Discharge Plan and Services In-house Referral: Clinical Social Work   Post Acute Care Choice: Skilled Nursing Facility          DME Arranged: N/A DME Agency: NA                  Social Determinants of Health (SDOH) Interventions     Readmission Risk Interventions No flowsheet data found.

## 2020-12-11 ENCOUNTER — Ambulatory Visit (INDEPENDENT_AMBULATORY_CARE_PROVIDER_SITE_OTHER): Payer: Medicare HMO | Admitting: Physician Assistant

## 2020-12-11 ENCOUNTER — Encounter: Payer: Self-pay | Admitting: Physician Assistant

## 2020-12-11 ENCOUNTER — Other Ambulatory Visit: Payer: Self-pay

## 2020-12-11 DIAGNOSIS — M869 Osteomyelitis, unspecified: Secondary | ICD-10-CM

## 2020-12-11 NOTE — Progress Notes (Signed)
Office Visit Note   Patient: Cory [name]           Date of Birth: 15-Sep-1945           MRN: [id number] Visit Date: 12/11/2020              Requested by: Frederich Chick., MD [address] [place],  [place] [address] PCP: Frederich Chick., MD  Chief Complaint  Patient presents with   Right Leg - Routine Post Op    [facility] no drainage wearing limb protector and vive compression sock. Photos obtained.       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  He is at [facility].  He has no complaints.  Assessment & Plan: Visit Diagnoses: No diagnosis found.  Plan: Orders are written for daily cleansing patient will follow-up in 1 week anticipate removal of staples in 1 to 2 weeks  Follow-Up Instructions: No follow-ups on file.   Ortho Exam  Patient is alert, oriented, no adenopathy, well-dressed, normal affect, normal respiratory effort. Examination wound VAC was removed he has well apposed wound edges with staples in place.  Swelling is well controlled he has no ascending cellulitis no signs of infection  Imaging: No results found.   Labs: Lab Results  Component Value Date   HGBA1C 12.1 (H) 11/27/2020   ESRSEDRATE 81 (H) 11/26/2020   ESRSEDRATE 57 (H) 11/22/2020   CRP 14.8 (H) 12/06/2020   CRP 19.6 (H) 11/26/2020   CRP 8.7 (H) 11/22/2020   LABURIC 3.2 (L) 11/22/2020   LABURIC 3.4 (L) 11/18/2020   REPTSTATUS 12/03/2020 FINAL 11/27/2020   GRAMSTAIN NO WBC SEEN NO ORGANISMS SEEN  11/27/2020   CULT  11/27/2020    MODERATE ESCHERICHIA COLI NO ANAEROBES ISOLATED Performed at [facility], [address], [place], [place] [address]    [facility] ESCHERICHIA COLI 11/27/2020     Lab Results  Component Value Date   ALBUMIN 2.2 (L) 12/07/2020   ALBUMIN 2.4 (L) 12/06/2020   ALBUMIN 2.6 (L) 11/28/2020    Lab Results  Component Value Date   MG  1.9 12/07/2020   MG 1.8 12/06/2020   MG 1.8 12/05/2020   No results found for: VD25OH  No results found for: PREALBUMIN CBC EXTENDED Latest Ref Rng & Units 12/07/2020 12/06/2020 12/05/2020  WBC 4.0 - 10.5 K/uL - 6.9 6.4  RBC 4.22 - 5.81 MIL/uL - 4.04(L) 4.07(L)  HGB 13.0 - 17.0 g/dL 8.2(N) 10.8(L) 11.0(L)  HCT 39.0 - 52.0 % 30.0(L) 32.6(L) 33.1(L)  PLT 150 - 400 K/uL - 216 205  NEUTROABS 1.7 - 7.7 K/uL - - -  LYMPHSABS 0.7 - 4.0 K/uL - - -     There is no height or weight on file to calculate BMI.  Orders:  No orders of the defined types were placed in this encounter.  No orders of the defined types were placed in this encounter.    Procedures: No procedures performed  Clinical Data: No additional findings.  ROS:  All other systems negative, except as noted in the HPI. Review of Systems  Objective: Vital Signs: There were no vitals taken for this visit.  Specialty Comments:  No specialty comments available.  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   Past Medical History:  Diagnosis Date   Diabetes mellitus without complication (HCC)    Hyperlipidemia    Hypertension     Family History  Problem Relation Age of Onset   Diabetes Mother    Diabetes Father     Past Surgical History:  Procedure Laterality Date   AMPUTATION Right 12/04/2020   Procedure: RIGHT BELOW KNEE AMPUTATION;  Surgeon: Nadara Mustard, MD;  Location: [facility] OR;  Service: Orthopedics;  Laterality: Right;   APPENDECTOMY     APPLICATION OF WOUND VAC Right 12/04/2020   Procedure: APPLICATION OF WOUND VAC;  Surgeon: Nadara Mustard, MD;  Location: [facility] OR;  Service: Orthopedics;  Laterality: Right;    Social History   Occupational History   Not on file  Tobacco Use   Smoking status: Never   Smokeless tobacco: Never  Substance and Sexual Activity   Alcohol use: Never   Drug use: Never   Sexual activity: Not on file

## 2020-12-18 ENCOUNTER — Other Ambulatory Visit: Payer: Self-pay

## 2020-12-18 ENCOUNTER — Ambulatory Visit (INDEPENDENT_AMBULATORY_CARE_PROVIDER_SITE_OTHER): Payer: Medicare HMO | Admitting: Physician Assistant

## 2020-12-18 ENCOUNTER — Encounter: Payer: Self-pay | Admitting: Physician Assistant

## 2020-12-18 DIAGNOSIS — Z89511 Acquired absence of right leg below knee: Secondary | ICD-10-CM

## 2020-12-18 NOTE — Progress Notes (Signed)
Office Visit Note   Patient: Cory [name]           Date of Birth: July 02, 1945           MRN: [id number] Visit Date: 12/18/2020              Requested by: Frederich Chick., MD [address] [place],  [place] [address] PCP: Frederich Chick., MD  Chief Complaint  Patient presents with   Right Leg - Routine Post Op    12/04/20 right BKA       HPI: Patient is 2 weeks status post right below-knee amputation.  He is doing well and is without complaints.  He does have some itching on the leg and dry skin  Assessment & Plan: Visit Diagnoses: No diagnosis found.  Plan: 1 we will follow-up in 1 week at that time staples should be removed and will plan on giving him a prescription for his prosthetic.  Also gave him a prescription to obtain 2 XL shrinkers as the one he has is too big  Follow-Up Instructions: No follow-ups on file.   Ortho Exam  Patient is alert, oriented, no adenopathy, well-dressed, normal affect, normal respiratory effort. Examination below-knee amputation stump well apposed wound edges minimal drainage minimal swelling no cellulitis no signs of infection  Imaging: No results found. No images are attached to the encounter.  Labs: Lab Results  Component Value Date   HGBA1C 12.1 (H) 11/27/2020   ESRSEDRATE 81 (H) 11/26/2020   ESRSEDRATE 57 (H) 11/22/2020   CRP 14.8 (H) 12/06/2020   CRP 19.6 (H) 11/26/2020   CRP 8.7 (H) 11/22/2020   LABURIC 3.2 (L) 11/22/2020   LABURIC 3.4 (L) 11/18/2020   REPTSTATUS 12/03/2020 FINAL 11/27/2020   GRAMSTAIN NO WBC SEEN NO ORGANISMS SEEN  11/27/2020   CULT  11/27/2020    MODERATE ESCHERICHIA COLI NO ANAEROBES ISOLATED Performed at [facility], [address], [place], [place] [address]    [facility] ESCHERICHIA COLI 11/27/2020     Lab Results  Component Value Date   ALBUMIN 2.2 (L) 12/07/2020   ALBUMIN 2.4 (L) 12/06/2020   ALBUMIN 2.6 (L) 11/28/2020    Lab Results  Component Value Date   MG 1.9  12/07/2020   MG 1.8 12/06/2020   MG 1.8 12/05/2020   No results found for: VD25OH  No results found for: PREALBUMIN CBC EXTENDED Latest Ref Rng & Units 12/07/2020 12/06/2020 12/05/2020  WBC 4.0 - 10.5 K/uL - 6.9 6.4  RBC 4.22 - 5.81 MIL/uL - 4.04(L) 4.07(L)  HGB 13.0 - 17.0 g/dL 5.9(D) 10.8(L) 11.0(L)  HCT 39.0 - 52.0 % 30.0(L) 32.6(L) 33.1(L)  PLT 150 - 400 K/uL - 216 205  NEUTROABS 1.7 - 7.7 K/uL - - -  LYMPHSABS 0.7 - 4.0 K/uL - - -     There is no height or weight on file to calculate BMI.  Orders:  No orders of the defined types were placed in this encounter.  No orders of the defined types were placed in this encounter.    Procedures: No procedures performed  Clinical Data: No additional findings.  ROS:  All other systems negative, except as noted in the HPI. Review of Systems  Objective: Vital Signs: There were no vitals taken for this visit.  Specialty Comments:  No specialty comments available.  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   Past Medical History:  Diagnosis Date   Diabetes mellitus without complication (HCC)    Hyperlipidemia    Hypertension     Family History  Problem Relation Age of Onset   Diabetes Mother    Diabetes Father     Past Surgical History:  Procedure Laterality Date   AMPUTATION Right 12/04/2020   Procedure: RIGHT BELOW KNEE AMPUTATION;  Surgeon: Nadara Mustard, MD;  Location: [facility] OR;  Service: Orthopedics;  Laterality: Right;   APPENDECTOMY     APPLICATION OF WOUND VAC Right 12/04/2020   Procedure: APPLICATION OF WOUND VAC;  Surgeon: Nadara Mustard, MD;  Location: [facility] OR;  Service: Orthopedics;  Laterality: Right;   Social  History   Occupational History   Not on file  Tobacco Use   Smoking status: Never   Smokeless tobacco: Never  Substance and Sexual Activity   Alcohol use: Never   Drug use: Never   Sexual activity: Not on file

## 2020-12-25 ENCOUNTER — Encounter: Payer: Self-pay | Admitting: Physician Assistant

## 2020-12-25 ENCOUNTER — Other Ambulatory Visit: Payer: Self-pay

## 2020-12-25 ENCOUNTER — Ambulatory Visit (INDEPENDENT_AMBULATORY_CARE_PROVIDER_SITE_OTHER): Payer: Medicare HMO | Admitting: Physician Assistant

## 2020-12-25 DIAGNOSIS — Z89511 Acquired absence of right leg below knee: Secondary | ICD-10-CM

## 2020-12-25 NOTE — Progress Notes (Signed)
Office Visit Note   Patient: Cory [name]           Date of Birth: [birth year]           MRN: [id number] Visit Date: 12/25/2020              Requested by: Frederich Chick., MD [address] [place],  [place] [address] PCP: Frederich Chick., MD  No chief complaint on file.     HPI: Patient presents today 3 weeks status post right below-knee amputation.  He is doing quite well and is without complaints.  Assessment & Plan: Visit Diagnoses: No diagnosis found.  Plan: Patient was provided a prescription for a prosthetic he will follow-up in 2 weeks.  Also given a prescription for new shrinkers that will fit him properly  Follow-Up Instructions: No follow-ups on file.   Ortho Exam  Patient is alert, oriented, no adenopathy, well-dressed, normal affect, normal respiratory effort. Examination right lower extremity well apposed wound edges swelling is very well controlled minimal bloody drainage.  No wound dehiscence.  No ascending cellulitis or signs of infection  Imaging: No results found. No images are attached to the encounter.  Labs: Lab Results  Component Value Date   HGBA1C 12.1 (H) 11/27/2020   ESRSEDRATE 81 (H) 11/26/2020   ESRSEDRATE 57 (H) 11/22/2020   CRP 14.8 (H) 12/06/2020   CRP 19.6 (H) 11/26/2020   CRP 8.7 (H) 11/22/2020   LABURIC 3.2 (L) 11/22/2020   LABURIC 3.4 (L) 11/18/2020   REPTSTATUS 12/03/2020 FINAL 11/27/2020   GRAMSTAIN NO WBC SEEN NO ORGANISMS SEEN  11/27/2020   CULT  11/27/2020    MODERATE ESCHERICHIA COLI NO ANAEROBES ISOLATED Performed at [facility], [address], [place], [place] [address]    [facility] ESCHERICHIA COLI 11/27/2020     Lab Results  Component Value Date   ALBUMIN 2.2 (L) 12/07/2020   ALBUMIN 2.4 (L) 12/06/2020   ALBUMIN 2.6 (L) 11/28/2020    Lab Results  Component Value Date   MG 1.9 12/07/2020   MG 1.8 12/06/2020   MG 1.8 12/05/2020   No results found for: VD25OH  No results found for:  PREALBUMIN CBC EXTENDED Latest Ref Rng & Units 12/07/2020 12/06/2020 12/05/2020  WBC 4.0 - 10.5 K/uL - 6.9 6.4  RBC 4.22 - 5.81 MIL/uL - 4.04(L) 4.07(L)  HGB 13.0 - 17.0 g/dL 8.4(Z) 10.8(L) 11.0(L)  HCT 39.0 - 52.0 % 30.0(L) 32.6(L) 33.1(L)  PLT 150 - 400 K/uL - 216 205  NEUTROABS 1.7 - 7.7 K/uL - - -  LYMPHSABS 0.7 - 4.0 K/uL - - -     There is no height or weight on file to calculate BMI.  Orders:  No orders of the defined types were placed in this encounter.  No orders of the defined types were placed in this encounter.    Procedures: No procedures performed  Clinical Data: No additional findings.  ROS:  All other systems negative, except as noted in the HPI. Review of Systems  Objective: Vital Signs: There were no vitals taken for this visit.  Specialty Comments:  No specialty comments available.  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   Past Medical  History:  Diagnosis Date   Diabetes mellitus without complication (HCC)    Hyperlipidemia    Hypertension     Family History  Problem Relation Age of Onset   Diabetes Mother    Diabetes Father     Past Surgical History:  Procedure Laterality Date   AMPUTATION Right 12/04/2020   Procedure: RIGHT BELOW KNEE AMPUTATION;  Surgeon: Nadara Mustard, MD;  Location[facility] OR;  Service: Orthopedics;  Laterality: Right;   APPENDECTOMY     APPLICATION OF WOUND VAC Right 12/04/2020   Procedure: APPLICATION OF WOUND VAC;  Surgeon: Nadara Mustard, MD;  Location: [facility] OR;  Service: Orthopedics;  Laterality: Right;   Social History   Occupational History   Not on file  Tobacco Use   Smoking status: Never   Smokeless tobacco:  Never  Substance and Sexual Activity   Alcohol use: Never   Drug use: Never   Sexual activity: Not on file

## 2021-01-14 ENCOUNTER — Other Ambulatory Visit: Payer: Self-pay

## 2021-01-14 ENCOUNTER — Encounter: Payer: Self-pay | Admitting: Physician Assistant

## 2021-01-14 ENCOUNTER — Ambulatory Visit (INDEPENDENT_AMBULATORY_CARE_PROVIDER_SITE_OTHER): Payer: Medicare HMO | Admitting: Physician Assistant

## 2021-01-14 DIAGNOSIS — Z89511 Acquired absence of right leg below knee: Secondary | ICD-10-CM

## 2021-01-14 NOTE — Progress Notes (Signed)
Office Visit Note   Patient: Cory [name]           Date of Birth: 02-14-1946           MRN: [id number] Visit Date: 01/14/2021              Requested by: Frederich Chick., MD [address] [place],  [place] [address] PCP: Frederich Chick., MD  Chief Complaint  Patient presents with   Right Leg - Routine Post Op    12/04/20 right BKA  Will be casted in 3 weeks for prosthetic       HPI: Patient presents today 5 weeks status post right below-knee amputation.  He is currently at a nursing facility.  No complaints.  He does have an appointment with [facility] to have a cast made  Assessment & Plan: Visit Diagnoses: No diagnosis found.  Plan: Given order for smaller shrinkers.  Should begin physical therapy when prosthetic obtained at the facility.  Follow-up in 1 month  Follow-Up Instructions: No follow-ups on file.   Ortho Exam  Patient is alert, oriented, no adenopathy, well-dressed, normal affect, normal respiratory effort. Well-healed surgical incision well apposed wound edges no ascending cellulitis or signs of infection swelling is well controlled  Imaging: No results found. No images are attached to the encounter.  Labs: Lab Results  Component Value Date   HGBA1C 12.1 (H) 11/27/2020   ESRSEDRATE 81 (H) 11/26/2020   ESRSEDRATE 57 (H) 11/22/2020   CRP 14.8 (H) 12/06/2020   CRP 19.6 (H) 11/26/2020   CRP 8.7 (H) 11/22/2020   LABURIC 3.2 (L) 11/22/2020   LABURIC 3.4 (L) 11/18/2020   REPTSTATUS 12/03/2020 FINAL 11/27/2020   GRAMSTAIN NO WBC SEEN NO ORGANISMS SEEN  11/27/2020   CULT  11/27/2020    MODERATE ESCHERICHIA COLI NO ANAEROBES ISOLATED Performed at [facility], [address], [place], [place] [address]    [facility] ESCHERICHIA COLI 11/27/2020     Lab Results  Component Value Date   ALBUMIN 2.2 (L) 12/07/2020   ALBUMIN 2.4 (L) 12/06/2020   ALBUMIN 2.6 (L) 11/28/2020    Lab Results  Component Value Date   MG 1.9 12/07/2020   MG 1.8  12/06/2020   MG 1.8 12/05/2020   No results found for: VD25OH  No results found for: PREALBUMIN CBC EXTENDED Latest Ref Rng & Units 12/07/2020 12/06/2020 12/05/2020  WBC 4.0 - 10.5 K/uL - 6.9 6.4  RBC 4.22 - 5.81 MIL/uL - 4.04(L) 4.07(L)  HGB 13.0 - 17.0 g/dL 8.2(N) 10.8(L) 11.0(L)  HCT 39.0 - 52.0 % 30.0(L) 32.6(L) 33.1(L)  PLT 150 - 400 K/uL - 216 205  NEUTROABS 1.7 - 7.7 K/uL - - -  LYMPHSABS 0.7 - 4.0 K/uL - - -     There is no height or weight on file to calculate BMI.  Orders:  No orders of the defined types were placed in this encounter.  No orders of the defined types were placed in this encounter.    Procedures: No procedures performed  Clinical Data: No additional findings.  ROS:  All other systems negative, except as noted in the HPI. Review of Systems  Objective: Vital Signs: There were no vitals taken for this visit.  Specialty Comments:  No specialty comments available.  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   Past Medical History:  Diagnosis Date   Diabetes mellitus without complication (HCC)    Hyperlipidemia    Hypertension     Family History  Problem Relation Age of Onset   Diabetes Mother    Diabetes Father     Past Surgical History:  Procedure Laterality Date   AMPUTATION Right 12/04/2020   Procedure: RIGHT BELOW KNEE AMPUTATION;  Surgeon: Nadara Mustard, MD;  Location: [facility] OR;  Service: Orthopedics;  Laterality: Right;   APPENDECTOMY     APPLICATION OF WOUND VAC Right 12/04/2020   Procedure: APPLICATION OF WOUND VAC;  Surgeon: Nadara Mustard, MD;  Location: [facility] OR;  Service: Orthopedics;  Laterality: Right;   Social History    Occupational History   Not on file  Tobacco Use   Smoking status: Never   Smokeless tobacco: Never  Substance and Sexual Activity   Alcohol use: Never   Drug use: Never   Sexual activity: Not on file

## 2021-01-19 ENCOUNTER — Ambulatory Visit: Payer: Medicare HMO | Admitting: Physician Assistant

## 2021-02-12 ENCOUNTER — Ambulatory Visit: Payer: Medicare HMO | Admitting: Family

## 2021-02-16 ENCOUNTER — Ambulatory Visit: Payer: Medicare HMO | Admitting: Family

## 2021-03-02 ENCOUNTER — Other Ambulatory Visit: Payer: Self-pay

## 2021-03-02 ENCOUNTER — Encounter: Payer: Self-pay | Admitting: Family

## 2021-03-02 ENCOUNTER — Ambulatory Visit (INDEPENDENT_AMBULATORY_CARE_PROVIDER_SITE_OTHER): Payer: Medicare HMO | Admitting: Family

## 2021-03-02 DIAGNOSIS — Z89511 Acquired absence of right leg below knee: Secondary | ICD-10-CM | POA: Insufficient documentation

## 2021-03-02 NOTE — Progress Notes (Signed)
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  He states his prosthesis casting appointment later today Ortho Exam On examination of the right residual limb this is well consolidated well-healed  Visit Diagnoses: No diagnosis found.  Plan: He will follow-up with [place] on an as-needed basis.  Order given for his prosthesis set up with [facility]  Follow-Up Instructions: No follow-ups on file.   Imaging: No results found.  Orders:  No orders of the defined types were placed in this encounter.  No orders of the defined types were placed in this encounter.    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   Past Medical History:  Diagnosis Date   Diabetes mellitus without complication (HCC)    Hyperlipidemia    Hypertension     Family History  Problem Relation Age of Onset   Diabetes Mother    Diabetes Father     Past Surgical History:  Procedure Laterality Date   AMPUTATION Right 12/04/2020   Procedure: RIGHT BELOW KNEE AMPUTATION;  Surgeon: Nadara Mustard, MD;  Location: [facility] OR;  Service: Orthopedics;  Laterality: Right;   APPENDECTOMY     APPLICATION OF WOUND VAC Right 12/04/2020   Procedure: APPLICATION OF WOUND VAC;  Surgeon: Nadara Mustard, MD;  Location: [facility] OR;  Service: Orthopedics;  Laterality: Right;   Social History   Occupational History    Not on file  Tobacco Use   Smoking status: Never   Smokeless tobacco: Never  Substance and Sexual Activity   Alcohol use: Never   Drug use: Never   Sexual activity: Not on file

## 2021-04-08 ENCOUNTER — Telehealth: Payer: Self-pay

## 2021-04-08 NOTE — Telephone Encounter (Signed)
Pt called in stating that he now has his prosthetic leg. He said he would like the order to be sent in for home health so he can start to walk.   Please advise

## 2021-04-09 ENCOUNTER — Telehealth: Payer: Self-pay | Admitting: Orthopedic Surgery

## 2021-04-09 NOTE — Telephone Encounter (Signed)
I spoke with pt, he would like to use [facility], he used them in the past after his amputation. I have written a referral to them and faxed.

## 2021-04-09 NOTE — Telephone Encounter (Signed)
noted 

## 2021-04-09 NOTE — Telephone Encounter (Signed)
Arelia Longest called wanting to let brittany know she accepts the referral that was sent.  Christy# [phone]

## 2021-06-02 ENCOUNTER — Encounter: Payer: Self-pay | Admitting: Family

## 2021-06-02 ENCOUNTER — Other Ambulatory Visit: Payer: Self-pay

## 2021-06-02 ENCOUNTER — Ambulatory Visit (INDEPENDENT_AMBULATORY_CARE_PROVIDER_SITE_OTHER): Payer: Medicare HMO | Admitting: Family

## 2021-06-02 DIAGNOSIS — Z89511 Acquired absence of right leg below knee: Secondary | ICD-10-CM | POA: Diagnosis not present

## 2021-06-02 NOTE — Progress Notes (Signed)
Office Visit Note   Patient: Cory [name]           Date of Birth: [birth year]           MRN: [id number] Visit Date: 06/02/2021              Requested by: Frederich Chick., MD [address] [place],  [place] [address] PCP: Frederich Chick., MD  Chief Complaint  Patient presents with   Right Leg - Follow-up    12/04/2020 right BKA       HPI: The patient is a 76 year old gentleman who presents today complaining of excruciating pain with wear of his prosthesis.  He is a new below-knee amputation with a new prosthesis set up.  He states he is only had his prosthesis for a number of weeks and has been having significant pain to the distal tibia from an bearing.  He states he is unable to tolerate the prosthesis at all.  He has been working with Kathlene November.  He states he has had some modifications to his socket which have not relieved his symptoms  Assessment & Plan: Visit Diagnoses: No diagnosis found.  Plan: We will reach out to [facility] with [employer] regarding his socket.  Have encouraged the patient to follow-up with [employer]otech as well for modification to his prosthesis  Follow-Up Instructions: Return if symptoms worsen or fail to improve.   Ortho Exam  Patient is alert, oriented, no adenopathy, well-dressed, normal affect, normal respiratory effort. On examination of the right residual limb this is well consolidated well-healed there is no ecchymosis no erythema no skin breakdown.  Tenderness to the distal tibia  Imaging: No results found. No images are attached to the encounter.  Labs: Lab Results  Component Value Date   HGBA1C 12.1 (H) 11/27/2020   ESRSEDRATE 81 (H) 11/26/2020   ESRSEDRATE 57 (H) 11/22/2020   CRP 14.8 (H) 12/06/2020   CRP 19.6 (H) 11/26/2020   CRP 8.7 (H) 11/22/2020   LABURIC 3.2 (L) 11/22/2020   LABURIC 3.4 (L) 11/18/2020   REPTSTATUS 12/03/2020 FINAL 11/27/2020   GRAMSTAIN NO WBC SEEN NO ORGANISMS SEEN  11/27/2020   CULT  11/27/2020    MODERATE  ESCHERICHIA COLI NO ANAEROBES ISOLATED Performed at [facility], [address], [place], [place] [address]    [facility] ESCHERICHIA COLI 11/27/2020     Lab Results  Component Value Date   ALBUMIN 2.2 (L) 12/07/2020   ALBUMIN 2.4 (L) 12/06/2020   ALBUMIN 2.6 (L) 11/28/2020    Lab Results  Component Value Date   MG 1.9 12/07/2020   MG 1.8 12/06/2020   MG 1.8 12/05/2020   No results found for: VD25OH  No results found for: PREALBUMIN CBC EXTENDED Latest Ref Rng & Units 12/07/2020 12/06/2020 12/05/2020  WBC 4.0 - 10.5 K/uL - 6.9 6.4  RBC 4.22 - 5.81 MIL/uL - 4.04(L) 4.07(L)  HGB 13.0 - 17.0 g/dL 1.3(K) 10.8(L) 11.0(L)  HCT 39.0 - 52.0 % 30.0(L) 32.6(L) 33.1(L)  PLT 150 - 400 K/uL - 216 205  NEUTROABS 1.7 - 7.7 K/uL - - -  LYMPHSABS 0.7 - 4.0 K/uL - - -     There is no height or weight on file to calculate BMI.  Orders:  No orders of the defined types were placed in this encounter.  No orders of the defined types were placed in this encounter.    Procedures: No procedures performed  Clinical Data: No additional findings.  ROS:  All other systems negative, except as noted in the HPI. Review of Systems  Objective: Vital Signs: There were no vitals taken for this visit.  Specialty Comments:  No specialty comments available.  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   Past Medical History:  Diagnosis Date   Diabetes mellitus without complication (HCC)    Hyperlipidemia    Hypertension     Family History  Problem Relation Age of Onset   Diabetes Mother    Diabetes Father     Past Surgical  History:  Procedure Laterality Date   AMPUTATION Right 12/04/2020   Procedure: RIGHT BELOW KNEE AMPUTATION;  Surgeon: Nadara Mustard, MD;  Location[facility] OR;  Service: Orthopedics;  Laterality: Right;   APPENDECTOMY     APPLICATION OF WOUND VAC Right 12/04/2020   Procedure: APPLICATION OF WOUND VAC;  Surgeon: Nadara Mustard, MD;  Location: [facility] OR;  Service: Orthopedics;  Laterality: Right;   Social History   Occupational History   Not on file  Tobacco Use   Smoking status: Never   Smokeless tobacco: Never  Substance and Sexual Activity   Alcohol use: Never   Drug use: Never   Sexual activity: Not on file

## 2021-09-13 ENCOUNTER — Encounter: Payer: Self-pay | Admitting: Physician Assistant

## 2021-09-13 ENCOUNTER — Ambulatory Visit: Payer: Medicare HMO | Admitting: Physician Assistant

## 2021-09-13 DIAGNOSIS — Z89511 Acquired absence of right leg below knee: Secondary | ICD-10-CM | POA: Diagnosis not present

## 2021-09-13 NOTE — Progress Notes (Signed)
? ?Office Visit Note ?  ?Patient: Cory [name]           ?Date of Birth: [birth year]           ?MRN: [id number] ?Visit Date: 09/13/2021 ?             ?Requested by: Frederich Chick., MD ?[address] ?[place],  [place] [address] ?PCP: Frederich Chick., MD ? ?Chief Complaint  ?Patient presents with  ? Right Leg - Follow-up  ? ? ? ? ?HPI: ?Patient is a pleasant 76 year old gentleman who is 10 months status post right below-knee amputation done by Dr. Lajoyce Corners.  He has been working with Technical sales engineer to adjust his prosthetic as his stump has shrunk.  They have tried extensive padding to make the current socket work.  He also is wearing multiple layers socks.  [facility] is requesting that he will need to have a new prosthetic made.  The patient is a is afraid to wear his current prosthetic because even with all the adjustments it is unstable and he is afraid he is going to get skin breakdown on the end of his stump ? ?Assessment & Plan: ?Visit Diagnoses: Right below-knee amputation ? ?Plan: Patient was given a prescription for a new socket and supplies may follow-up with Dr. Lajoyce Corners as needed ?Patient is an existing right transtibial  amputee. ? ?Patient's current comorbidities are not expected to impact the ability to function with the prescribed prosthesis. ?Patient verbally communicates a strong desire to use a prosthesis. ?Patient currently requires mobility aids to ambulate without a prosthesis.  Expects not to use mobility aids with a new prosthesis. ? ?Patient is a K3 level ambulator that spends a lot of time walking around on uneven terrain over obstacles, up and down stairs, and ambulates with a variable cadence. ? ? ? ?Follow-Up Instructions: No follow-ups on file.  ? ?Ortho Exam ? ?Patient is alert, oriented, no adenopathy, well-dressed, normal affect, normal respiratory effort. ?Examination of his right below-knee amputation stump there is no skin breakdown no erythema no ascending cellulitis no soft tissue  swelling ? ?Imaging: ?No results found. ?No images are attached to the encounter. ? ?Labs: ?Lab Results  ?Component Value Date  ? HGBA1C 12.1 (H) 11/27/2020  ? ESRSEDRATE 81 (H) 11/26/2020  ? ESRSEDRATE 57 (H) 11/22/2020  ? CRP 14.8 (H) 12/06/2020  ? CRP 19.6 (H) 11/26/2020  ? CRP 8.7 (H) 11/22/2020  ? LABURIC 3.2 (L) 11/22/2020  ? LABURIC 3.4 (L) 11/18/2020  ? REPTSTATUS 12/03/2020 FINAL 11/27/2020  ? GRAMSTAIN NO WBC SEEN ?NO ORGANISMS SEEN ? 11/27/2020  ? CULT  11/27/2020  ?  MODERATE ESCHERICHIA COLI ?NO ANAEROBES ISOLATED ?Performed at [facility], [address] [place], [place] [address] ?  ? LABORGA ESCHERICHIA COLI 11/27/2020  ? ? ? ?Lab Results  ?Component Value Date  ? ALBUMIN 2.2 (L) 12/07/2020  ? ALBUMIN 2.4 (L) 12/06/2020  ? ALBUMIN 2.6 (L) 11/28/2020  ? ? ?Lab Results  ?Component Value Date  ? MG 1.9 12/07/2020  ? MG 1.8 12/06/2020  ? MG 1.8 12/05/2020  ? ?No results found for: VD25OH ? ?No results found for: PREALBUMIN ? ?  Latest Ref Rng & Units 12/07/2020  ?  2:51 AM 12/06/2020  ?  3:14 AM 12/05/2020  ?  2:57 AM  ?CBC EXTENDED  ?WBC 4.0 - 10.5 K/uL  6.9   6.4    ?RBC 4.22 - 5.81 MIL/uL  4.04   4.07    ?  Hemoglobin 13.0 - 17.0 g/dL 9.8   69.6   29.5    ?HCT 39.0 - 52.0 % 30.0   32.6   33.1    ?Platelets 150 - 400 K/uL  216   205    ? ? ? ?There is no height or weight on file to calculate BMI. ? ?Orders:  ?No orders of the defined types were placed in this encounter. ? ?No orders of the defined types were placed in this encounter. ? ? ? Procedures: ?No procedures performed ? ?Clinical Data: ?No additional findings. ? ?ROS: ? ?All other systems negative, except as noted in the HPI. ?Review of Systems ? ?Objective: ?Vital Signs: There were no vitals taken for this visit. ? ?Specialty Comments:  ?No specialty comments available. ? ?PMFS History: ?Patient Active Problem List  ? Diagnosis Date Noted  ? 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/02/2021  ? Moderate protein-calorie malnutrition  (HCC)   ? Sepsis (HCC) 11/27/2020  ? Overweight (BMI 25.0-29.9) 11/27/2020  ? Hyperlipidemia 11/26/2020  ? 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) 11/26/2020  ? Hypertension 11/26/2020  ? 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 ?Past Medical History:  ?Diagnosis Date  ? Diabetes mellitus without complication (HCC)   ? Hyperlipidemia   ? Hypertension   ?  ?Family History  ?Problem Relation Age of Onset  ? Diabetes Mother   ? Diabetes Father   ?  ?Past Surgical History:  ?Procedure Laterality Date  ? AMPUTATION Right 12/04/2020  ? Procedure: RIGHT BELOW KNEE AMPUTATION;  Surgeon: Nadara Mustard, MD;  Location: [facility] OR;  Service: Orthopedics;  Laterality: Right;  ? APPENDECTOMY    ? APPLICATION OF WOUND VAC Right 12/04/2020  ? Procedure: APPLICATION OF WOUND VAC;  Surgeon: Nadara Mustard, MD;  Location: [facility] OR;  Service: Orthopedics;  Laterality: Right;  ? ?Social History  ? ?Occupational History  ? Not on file  ?Tobacco Use  ? Smoking status: Never  ? Smokeless tobacco: Never  ?Substance and Sexual Activity  ? Alcohol use: Never  ? Drug use: Never  ? Sexual activity: Not on file  ? ? ? ? ? ?

## 2022-11-07 ENCOUNTER — Encounter (HOSPITAL_BASED_OUTPATIENT_CLINIC_OR_DEPARTMENT_OTHER): Payer: Self-pay

## 2022-11-07 ENCOUNTER — Emergency Department (HOSPITAL_BASED_OUTPATIENT_CLINIC_OR_DEPARTMENT_OTHER): Payer: Medicare HMO

## 2022-11-07 ENCOUNTER — Other Ambulatory Visit: Payer: Self-pay

## 2022-11-07 ENCOUNTER — Emergency Department (HOSPITAL_BASED_OUTPATIENT_CLINIC_OR_DEPARTMENT_OTHER)
Admission: EM | Admit: 2022-11-07 | Discharge: 2022-11-07 | Disposition: A | Discharge status: Home / Self Care | Payer: Medicare HMO | Attending: Emergency Medicine | Admitting: Emergency Medicine

## 2022-11-07 DIAGNOSIS — Y92009 Unspecified place in unspecified non-institutional (private) residence as the place of occurrence of the external cause: Secondary | ICD-10-CM | POA: Diagnosis not present

## 2022-11-07 DIAGNOSIS — Z89511 Acquired absence of right leg below knee: Secondary | ICD-10-CM | POA: Diagnosis not present

## 2022-11-07 DIAGNOSIS — E119 Type 2 diabetes mellitus without complications: Secondary | ICD-10-CM | POA: Insufficient documentation

## 2022-11-07 DIAGNOSIS — Z7984 Long term (current) use of oral hypoglycemic drugs: Secondary | ICD-10-CM | POA: Diagnosis not present

## 2022-11-07 DIAGNOSIS — S0993XA Unspecified injury of face, initial encounter: Secondary | ICD-10-CM | POA: Diagnosis present

## 2022-11-07 DIAGNOSIS — I10 Essential (primary) hypertension: Secondary | ICD-10-CM | POA: Diagnosis not present

## 2022-11-07 DIAGNOSIS — Z79899 Other long term (current) drug therapy: Secondary | ICD-10-CM | POA: Insufficient documentation

## 2022-11-07 DIAGNOSIS — W01198A Fall on same level from slipping, tripping and stumbling with subsequent striking against other object, initial encounter: Secondary | ICD-10-CM | POA: Diagnosis not present

## 2022-11-07 DIAGNOSIS — Z794 Long term (current) use of insulin: Secondary | ICD-10-CM | POA: Diagnosis not present

## 2022-11-07 DIAGNOSIS — W19XXXA Unspecified fall, initial encounter: Secondary | ICD-10-CM

## 2022-11-07 DIAGNOSIS — S0083XA Contusion of other part of head, initial encounter: Secondary | ICD-10-CM | POA: Insufficient documentation

## 2022-11-07 MED ORDER — ACETAMINOPHEN 325 MG PO TABS
650.0000 mg | ORAL_TABLET | Freq: Once | ORAL | Status: AC
  Administered 2022-11-07: 650 mg via ORAL
  Filled 2022-11-07: qty 2

## 2022-11-07 NOTE — ED Provider Notes (Signed)
[facility] AT [facility] Provider Note   CSN: [id number] Arrival date & time: 11/07/22  1049     History  Chief Complaint  Patient presents with   Dequavious Oberlander is a 77 y.o. male.   Fall   77 year old male presents emergency department with complaints of fall that occurred this morning.  Patient with right-sided below the knee amputation stated that he was leaning down attempting to put on his shoes while at home and lost his balance falling forwards hitting his forehead on the wooden floor while at home.  Denies loss of consciousness, anticoagulation use.  Denies any visual symptoms, gait abnormality, weakness/sensory deficits in upper or lower extremities, slurred speech, facial droop.  Denies any trauma elsewhere.  Denies chest pain, shortness of breath, abdominal pain.  Does report neck being slightly sore as time has elapsed since incident.  Past medical history significant for hypertension, hyperlipidemia, diabetes mellitus, gout,  Home Medications Prior to Admission medications   Medication Sig Start Date End Date Taking? Authorizing Provider  Accu-Chek Softclix Lancets lancets USE AS directed to test blood sugar daily 11/02/20     acetaminophen (TYLENOL) 325 MG tablet Take 2 tablets (650 mg total) by mouth every 6 (six) hours as needed for mild pain, headache or fever (or Fever >/= 101). 12/07/20   Almon Hercules, MD  atorvastatin (LIPITOR) 20 MG tablet Take 20 mg by mouth at bedtime. 09/14/20   [provider]  Blood Glucose Monitoring Suppl (BLOOD GLUCOSE MONITOR SYSTEM) w/Device KIT 1 (one) Kit as directed 11/02/20     colchicine 0.6 MG tablet Take 1 tablet (0.6 mg total) by mouth daily as needed (for gout flare). 12/07/20   Almon Hercules, MD  dapagliflozin propanediol (FARXIGA) 10 MG TABS tablet Take 1 tablet by mouth every morning 11/02/20     Dulaglutide (TRULICITY) 0.75 MG/0.5ML SOPN 1 (one) Solution Pen-injector under skin once a  week 11/02/20     glucose blood test strip use as directed to test blood sugar daily 11/02/20     Insulin Pen Needle (B-D UF III MINI PEN NEEDLES) 31G X 5 MM MISC 1 (one) Each daily 11/02/20     LANTUS SOLOSTAR 100 UNIT/ML Solostar Pen Inject 45 Units into the skin daily. 11/11/20   [provider]  losartan-hydrochlorothiazide (HYZAAR) 100-25 MG tablet Take 1 tablet by mouth daily. 09/14/20   [provider]  metFORMIN (GLUCOPHAGE) 1000 MG tablet Take 1,000 mg by mouth 2 (two) times daily. 06/15/20   [provider]  metoprolol succinate (TOPROL-XL) 100 MG 24 hr tablet Take 100 mg by mouth daily. 11/09/20   [provider]  NIFEdipine (PROCARDIA-XL/NIFEDICAL-XL) 30 MG 24 hr tablet Take 30 mg by mouth daily. 09/16/20   [provider]  tamsulosin (FLOMAX) 0.4 MG CAPS capsule Take 1 capsule (0.4 mg total) by mouth daily. 12/07/20   Almon Hercules, MD  furosemide (LASIX) 20 MG tablet Take 1-2 tablets in the morning for your ankle and foot swelling. Patient not taking: No sig reported 11/12/20 11/27/20  Mardella Layman, MD      Allergies    Patient has no known allergies.    Review of Systems   Review of Systems  All other systems reviewed and are negative.   Physical Exam Updated Vital Signs BP (!) 138/91 (BP Location: Right Arm)   Pulse 71   Temp 97.8 F (36.6 C)   Resp 16   Ht 5'  11" (1.803 m)   Wt 95.3 kg   SpO2 100%   BMI 29.29 kg/m  Physical Exam Vitals and nursing note reviewed.  Constitutional:      General: He is not in acute distress.    Appearance: He is well-developed.  HENT:     Head: Normocephalic.     Comments: Small hematoma noted left forehead region.  No obvious abrasion/laceration/opening. Eyes:     Extraocular Movements: Extraocular movements intact.     Conjunctiva/sclera: Conjunctivae normal.     Pupils: Pupils are equal, round, and reactive to light.  Cardiovascular:     Rate and Rhythm: Normal rate and regular rhythm.      Heart sounds: No murmur heard. Pulmonary:     Effort: Pulmonary effort is normal. No respiratory distress.     Breath sounds: Normal breath sounds.  Abdominal:     Palpations: Abdomen is soft.     Tenderness: There is no abdominal tenderness. There is no guarding.  Musculoskeletal:        General: No swelling.     Cervical back: Neck supple.     Comments: Right BKA appreciated  No midline tenderness of cervical, thoracic, lumbar spine with no obvious step-off or Forei noted.  Mild paraspinal tenderness on the left cervical region.  No chest wall tenderness to palpation.  Patient has full range of motion of bilateral shoulders, elbows, wrist, digits, hips, knee, left ankle/digits.  No overlying tenderness to palpation of upper or lower extremities.  Skin:    General: Skin is warm and dry.     Capillary Refill: Capillary refill takes less than 2 seconds.  Neurological:     Mental Status: He is alert.     Comments: Alert and oriented to self, place, time and event.   Speech is fluent, clear without dysarthria or dysphasia.   Strength symmetric in upper/lower extremities   Sensation intact in upper/lower extremities   Normal gait.  CN I not tested  CN II not tested CN III, IV, VI PERRLA and EOMs intact bilaterally  CN V Intact sensation to sharp and light touch to the face  CN VII facial movements symmetric  CN VIII not tested  CN IX, X no uvula deviation, symmetric rise of soft palate  CN XI symmetric SCM and trapezius strength bilaterally  CN XII Midline tongue protrusion, symmetric L/R movements     Psychiatric:        Mood and Affect: Mood normal.     ED Results / Procedures / Treatments   Labs (all labs ordered are listed, but only abnormal results are displayed) Labs Reviewed - No data to display  EKG None  Radiology CT Cervical Spine Wo Contrast  Result Date: 11/07/2022 CLINICAL DATA:  Neck trauma.  Fall with forehead injury EXAM: CT CERVICAL SPINE WITHOUT  CONTRAST TECHNIQUE: Multidetector CT imaging of the cervical spine was performed without intravenous contrast. Multiplanar CT image reconstructions were also generated. RADIATION DOSE REDUCTION: This exam was performed according to the departmental dose-optimization program which includes automated exposure control, adjustment of the mA and/or kV according to patient size and/or use of iterative reconstruction technique. COMPARISON:  None Available. FINDINGS: Alignment: Straightening of cervical lordosis. Skull base and vertebrae: No acute fracture. No primary bone lesion or focal pathologic process. Soft tissues and spinal canal: No prevertebral fluid or swelling. No visible canal hematoma. Disc levels: C2 to T2 confluent osteophytes with ankylosis. C3-4 moderate right foraminal narrowing from spurring. Upper chest: Clear apical lungs.  IMPRESSION: 1. No acute fracture or subluxation. 2. 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 Electronically Signed   By: Tiburcio Pea M.D.   On: 11/07/2022 13:16   CT Head Wo Contrast  Result Date: 11/07/2022 CLINICAL DATA:  77 year old male status post fall forward striking forehead. EXAM: CT HEAD WITHOUT CONTRAST TECHNIQUE: Contiguous axial images were obtained from the base of the skull through the vertex without intravenous contrast. RADIATION DOSE REDUCTION: This exam was performed according to the departmental dose-optimization program which includes automated exposure control, adjustment of the mA and/or kV according to patient size and/or use of iterative reconstruction technique. COMPARISON:  Head CT 11/26/2020. FINDINGS: Brain: Cerebral volume has not significantly changed since 2022. Patchy bilateral white matter hypodensity and also heterogeneity in the thalami has progressed since that time. No midline shift, ventriculomegaly, mass effect, evidence of mass lesion, intracranial hemorrhage or evidence of cortically based acute infarction. No  cortical encephalomalacia identified. Vascular: Calcified atherosclerosis at the skull base. No suspicious intracranial vascular hyperdensity. Skull: No fracture identified. Sinuses/Orbits: Visualized paranasal sinuses and mastoids are stable and well aerated. Other: 5-6 mm left forehead scalp hematoma on series 3, image 28. Underlying left frontal bone intact. No scalp soft tissue gas identified. Visible orbits soft tissues appears stable and negative. IMPRESSION: 1. Left forehead scalp hematoma without underlying skull fracture. 2. No acute intracranial abnormality identified. But evidence of progressed small vessel disease since 2022. Electronically Signed   By: Odessa Fleming M.D.   On: 11/07/2022 12:04    Procedures Procedures    Medications Ordered in ED Medications  acetaminophen (TYLENOL) tablet 650 mg (650 mg Oral Given 11/07/22 1238)    ED Course/ Medical Decision Making/ A&P                             Medical Decision Making Amount and/or Complexity of Data Reviewed Radiology: ordered.  Risk OTC drugs.   This patient presents to the ED for concern of fall, this involves an extensive number of treatment options, and is a complaint that carries with it a high risk of complications and morbidity.  The differential diagnosis includes CVA, fracture, strain/sprain, dislocation, ligamentous/tendinous injury, pneumothorax, solid organ damage    Co morbidities that complicate the patient evaluation  See HPI   Additional history obtained:  Additional history obtained from EMR External records from outside source obtained and reviewed including hospital records   Lab Tests:  N/a   Imaging Studies ordered:  I ordered imaging studies including CT head/C-spine I independently visualized and interpreted imaging which showed no acute fracture or subluxation.  Diffuse idiopathic skeletal hyper ptosis with extensive ankylosis.  Left forehead scalp hematoma without skull fracture.  No  acute intracranial abnormality.  Progressive small vessel disease. I agree with the radiologist interpretation  Cardiac Monitoring: / EKG:  The patient was maintained on a cardiac monitor.  I personally viewed and interpreted the cardiac monitored which showed an underlying rhythm of: Sinus rhythm   Consultations Obtained:  N/a   Problem List / ED Course / Critical interventions / Medication management  Fall I ordered medication including Tylenol   Reevaluation of the patient after these medicines showed that the patient improved I have reviewed the patients home medicines and have made adjustments as needed   Social Determinants of Health:  Denies tobacco, illicit drug use   Test / Admission - Considered:  Fall Vitals signs  within normal range and stable  throughout visit. Imaging studies significant for: See above 77 year old male presents emergency department after mechanical fall when he was attempting to put on his shoes earlier today.  Patient did suffer injury to his head but without loss of conscious, anticoagulation use so CT imaging of head/cervical spine were pursued.  Imaging studies unremarkable for any acute abnormality.  Patient without any acute neurologic deficit on exam.  Patient reassured by overall negative workup and recommended follow-up with primary care in the outpatient setting for reevaluation of symptoms.  Treatment plan discussed at length with patient and he acknowledged understanding was agreeable to said plan.  Patient overall well-appearing, afebrile in no acute distress. Worrisome signs and symptoms were discussed with the patient, and the patient acknowledged understanding to return to the ED if noticed. Patient was stable upon discharge.          Final Clinical Impression(s) / ED Diagnoses Final diagnoses:  Fall, initial encounter    Rx / DC Orders ED Discharge Orders     None         Peter Garter, [place] 11/07/22 1337     Terald Sleeper, MD 11/07/22 1515

## 2022-11-07 NOTE — ED Triage Notes (Signed)
Was putting on shoes and lost balance while doing that and fell forward hitting forehead.  NO LOC no blood thinners. Small hematoma noted to forehead.

## 2022-11-07 NOTE — Discharge Instructions (Signed)
As discussed, workup today overall reassuring.  CT imaging of your head and neck without obvious abnormality from fall.  No evidence of brain bleed or fracture/dislocation of your cervical spine.  See information attached for fall prevention at home.  Follow-up with primary care for reassessment of your symptoms.  Please do not hesitate to return to emergency department for worrisome signs and symptoms we discussed to become apparent.

## 2022-11-07 NOTE — ED Notes (Signed)
DC papers reviewed. No questions or concerns. No signs of distress. Pt assisted to wheelchair and out to lobby. Appropriate measures for safety taken. 

## 2022-12-22 IMAGING — DX DG FOOT COMPLETE 3+V*R*
3 series · 3 of 3 positions shown · non-contrast
Comparison: No prior.

CLINICAL DATA: Pain and swelling.

EXAM:
RIGHT FOOT COMPLETE - 3+ VIEW

[foot ap]
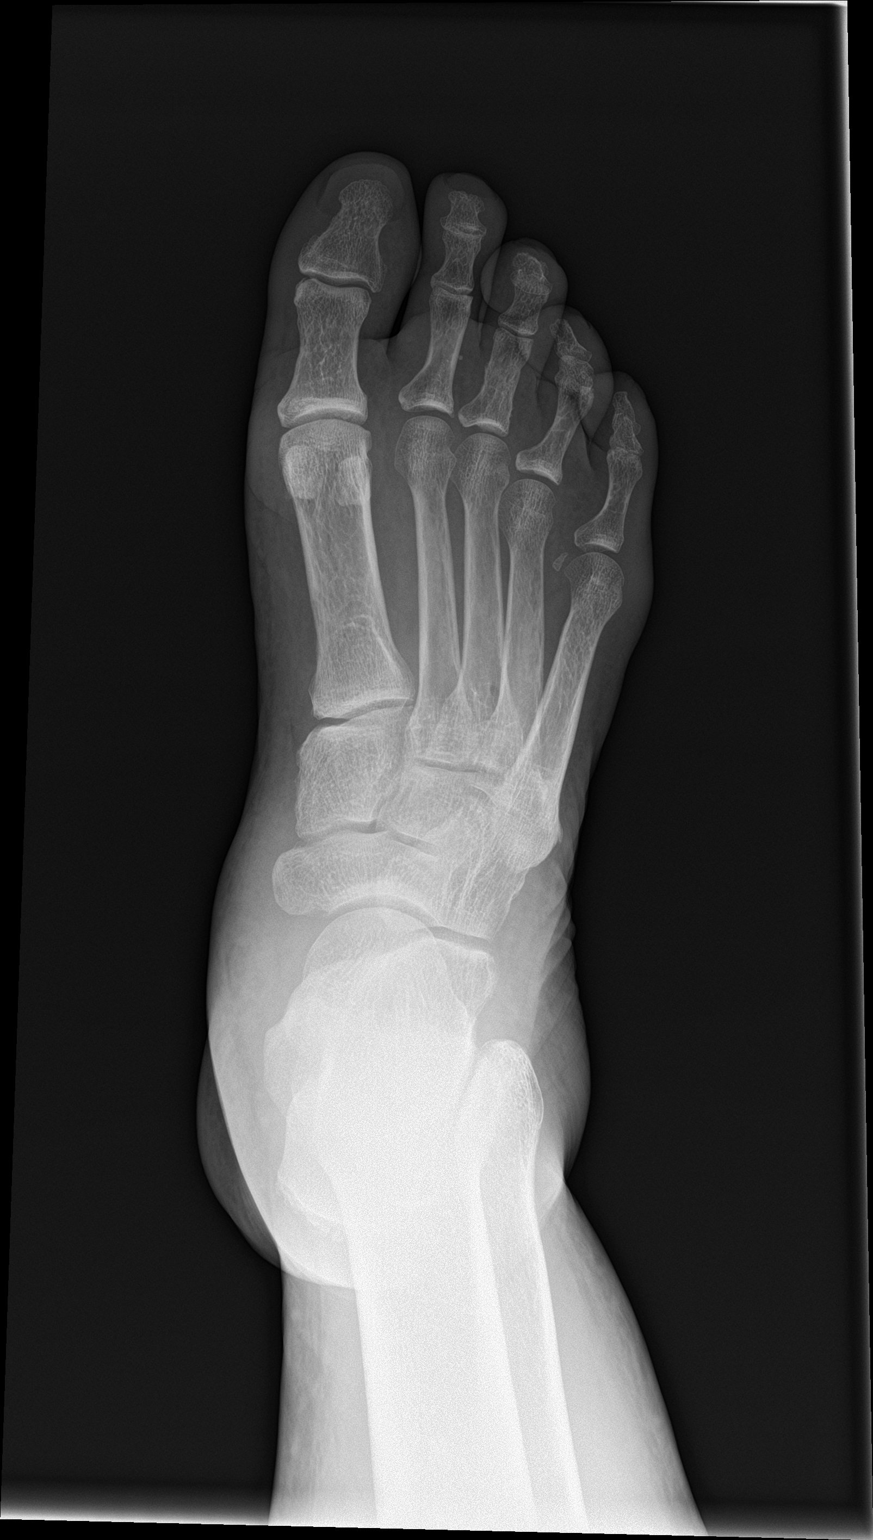

[foot obl]
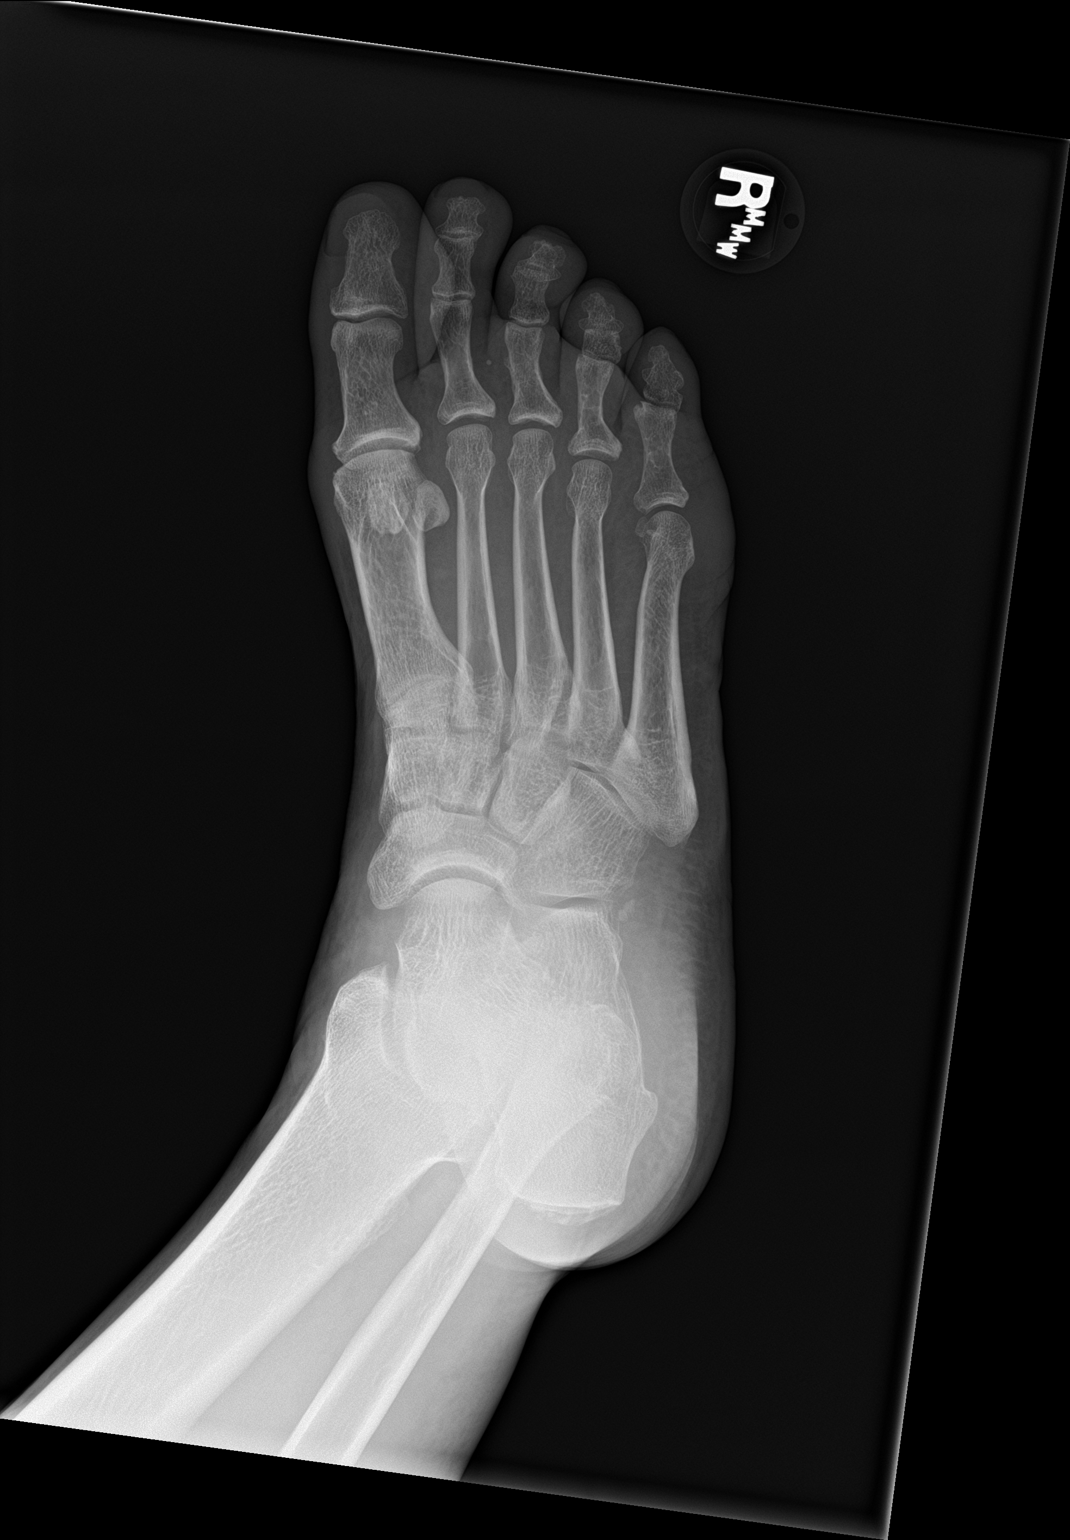

[foot lat]
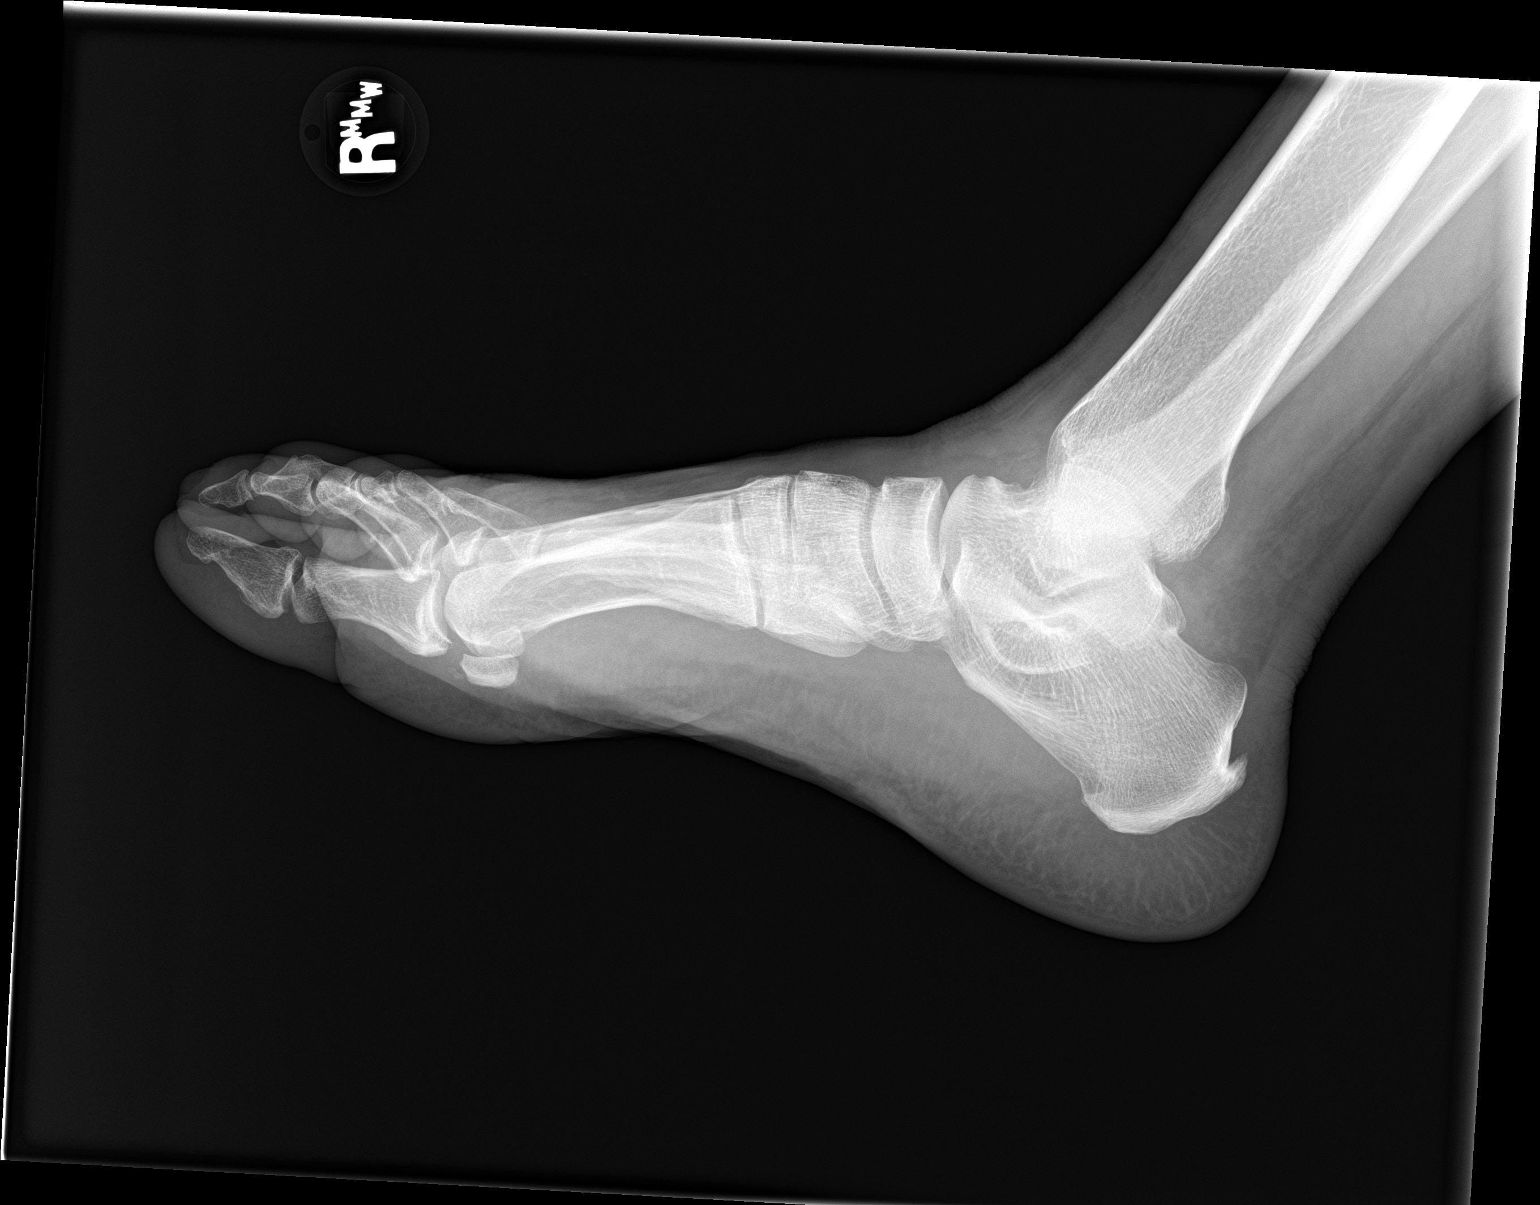

[3 of 3 positions shown; findings below may reference images not displayed]

FINDINGS: Diffuse soft tissue swelling. No radiopaque foreign body. No acute
bony or joint abnormality. No evidence of fracture or dislocation.
IMPRESSION: Diffuse soft tissue swelling. No radiopaque foreign body. No acute
bony abnormality.

## 2022-12-22 IMAGING — DX DG ANKLE COMPLETE 3+V*R*
3 series · 3 of 3 positions shown · non-contrast
Comparison: Ankle radiograph 10/28/2020

CLINICAL DATA: pain and swelling x 3 w

EXAM:
RIGHT ANKLE - COMPLETE 3+ VIEW

[ankle ap]
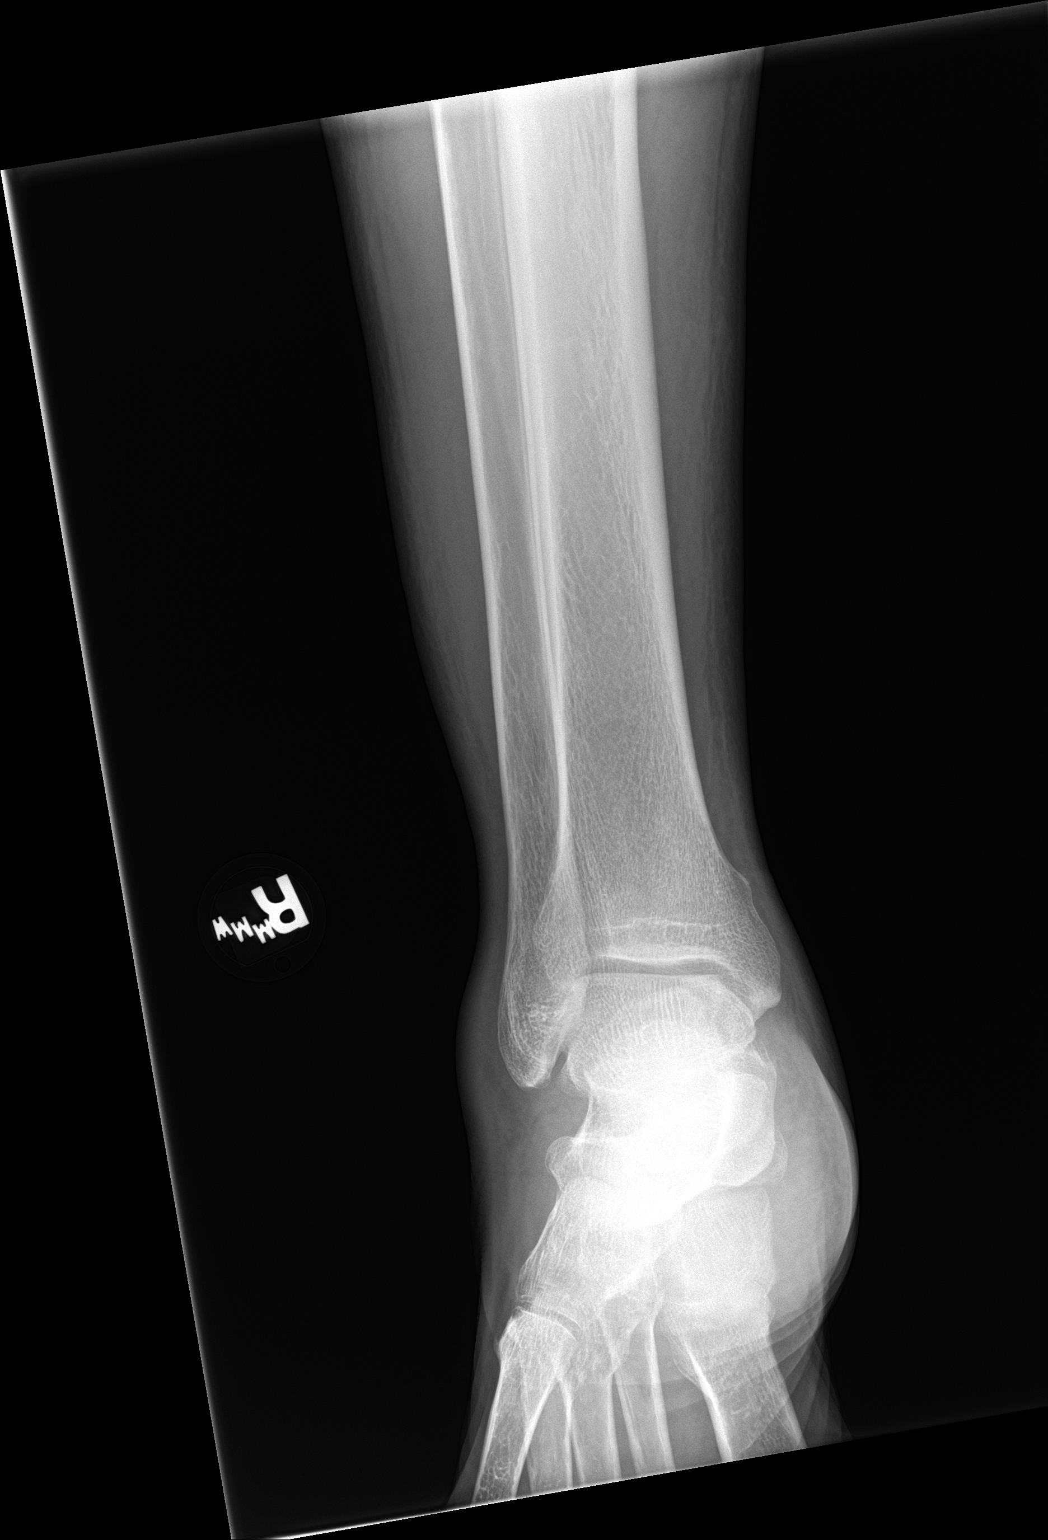

[ankle obl]
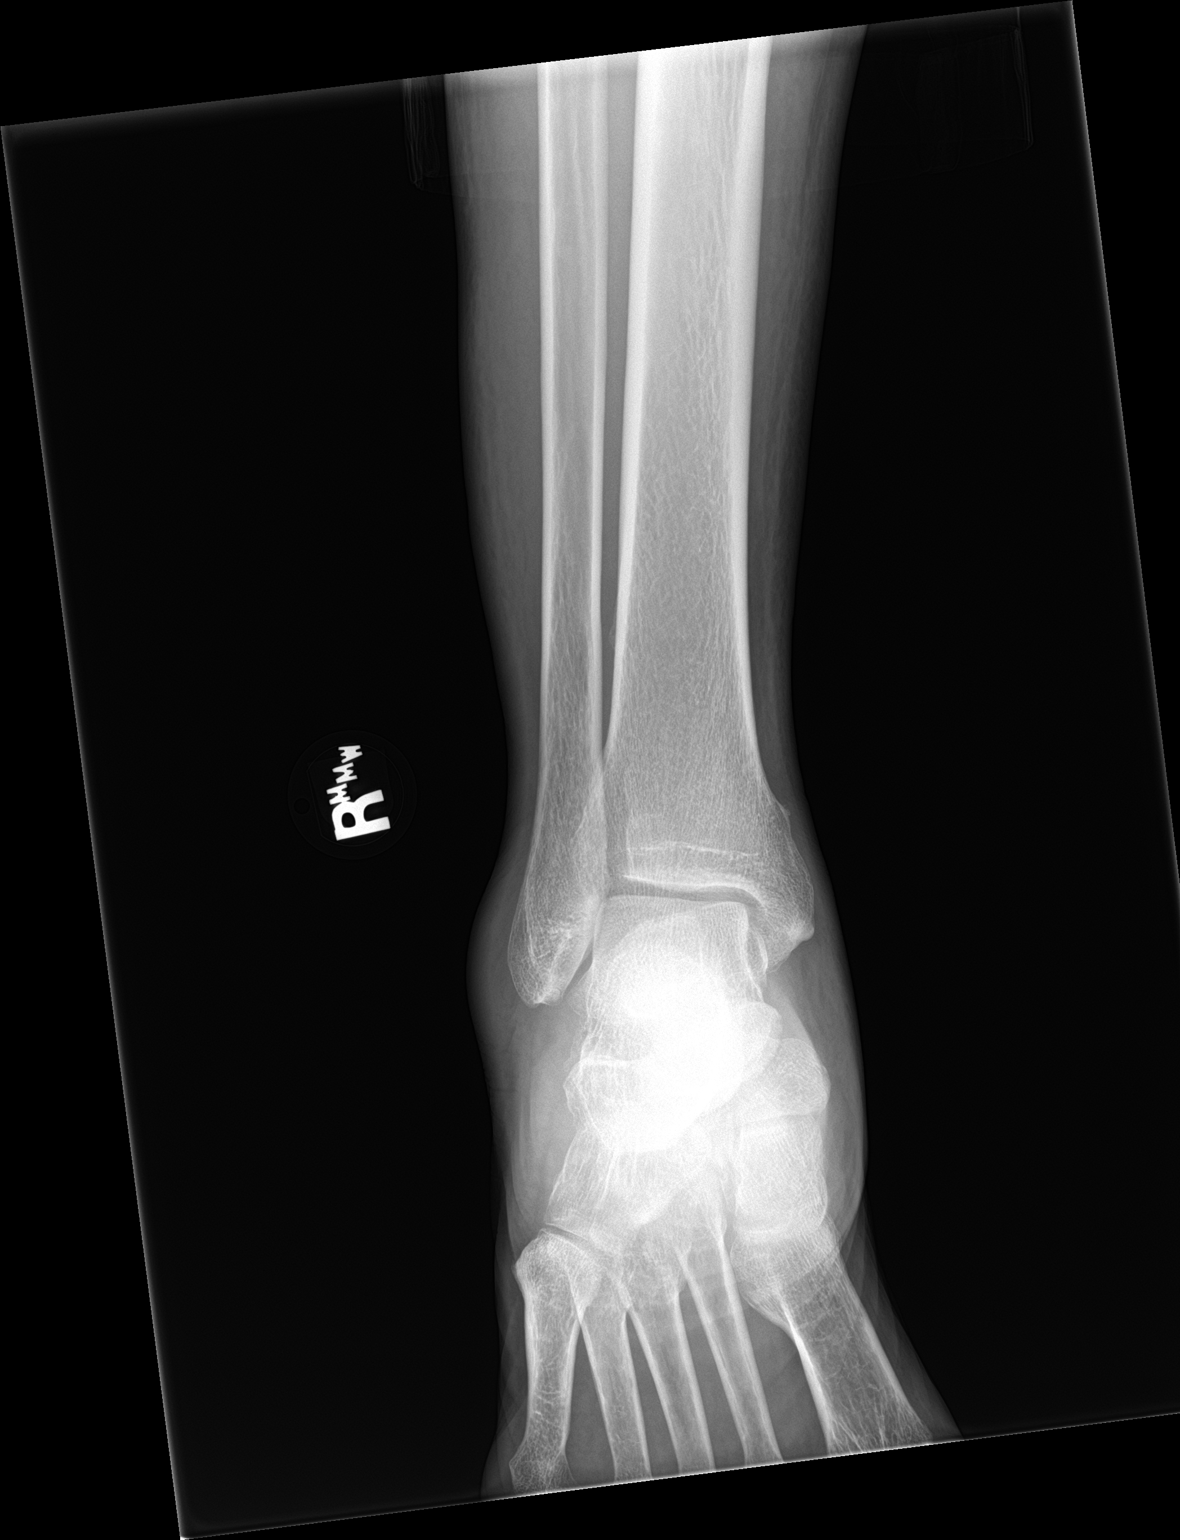

[ankle lat]
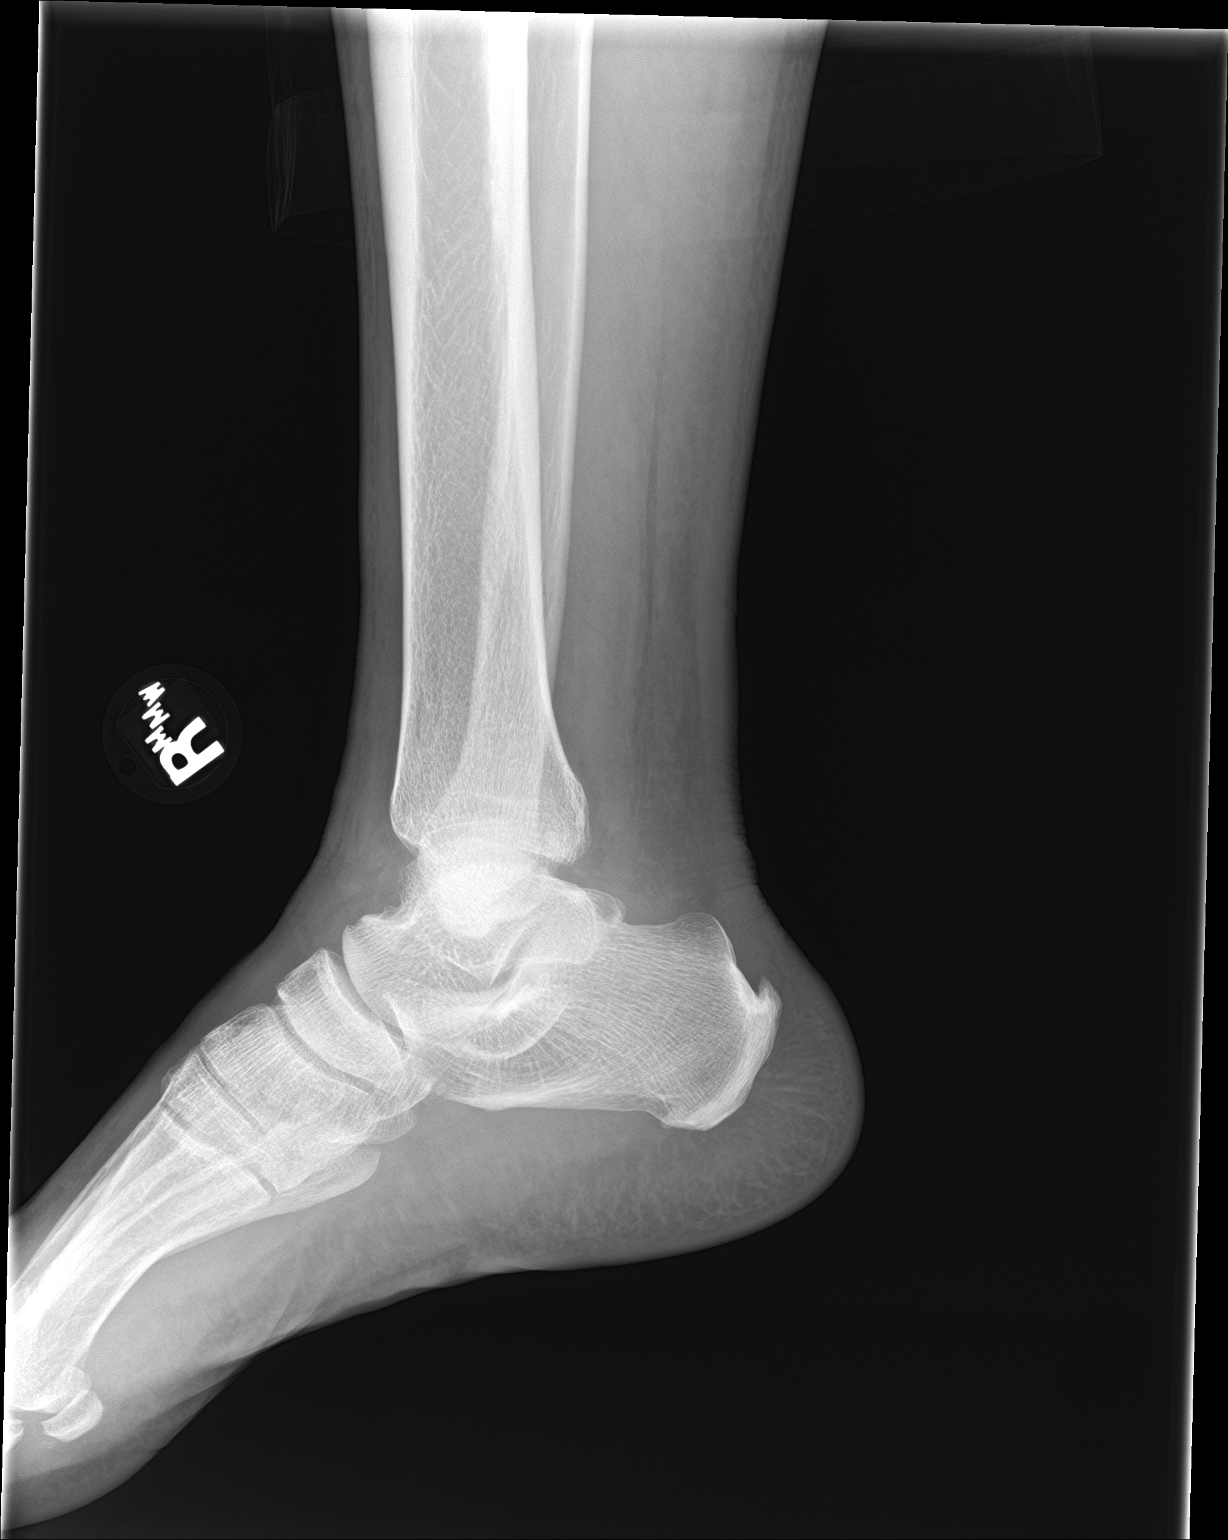

[3 of 3 positions shown; findings below may reference images not displayed]

FINDINGS: There is no evidence of acute fracture. There is ankle soft tissue
swelling most prominent laterally. There is a small Achilles
enthesophyte.
IMPRESSION: Ankle soft tissue swelling most prominent laterally. No visible
acute fracture.

## 2022-12-26 ENCOUNTER — Encounter (HOSPITAL_BASED_OUTPATIENT_CLINIC_OR_DEPARTMENT_OTHER): Payer: Self-pay | Admitting: Emergency Medicine

## 2022-12-26 ENCOUNTER — Emergency Department (HOSPITAL_BASED_OUTPATIENT_CLINIC_OR_DEPARTMENT_OTHER): Payer: Medicare HMO

## 2022-12-26 ENCOUNTER — Other Ambulatory Visit: Payer: Self-pay

## 2022-12-26 ENCOUNTER — Inpatient Hospital Stay (HOSPITAL_BASED_OUTPATIENT_CLINIC_OR_DEPARTMENT_OTHER)
Admission: EM | Admit: 2022-12-26 | Discharge: 2022-12-31 | DRG: 682 | Disposition: A | Discharge status: Home Health | Payer: Medicare HMO | Attending: Internal Medicine | Admitting: Internal Medicine

## 2022-12-26 ENCOUNTER — Other Ambulatory Visit (HOSPITAL_BASED_OUTPATIENT_CLINIC_OR_DEPARTMENT_OTHER): Payer: Self-pay

## 2022-12-26 DIAGNOSIS — Z7984 Long term (current) use of oral hypoglycemic drugs: Secondary | ICD-10-CM | POA: Diagnosis not present

## 2022-12-26 DIAGNOSIS — I10 Essential (primary) hypertension: Secondary | ICD-10-CM | POA: Diagnosis not present

## 2022-12-26 DIAGNOSIS — E1122 Type 2 diabetes mellitus with diabetic chronic kidney disease: Secondary | ICD-10-CM | POA: Insufficient documentation

## 2022-12-26 DIAGNOSIS — R531 Weakness: Secondary | ICD-10-CM

## 2022-12-26 DIAGNOSIS — I4892 Unspecified atrial flutter: Secondary | ICD-10-CM

## 2022-12-26 DIAGNOSIS — G9341 Metabolic encephalopathy: Secondary | ICD-10-CM | POA: Diagnosis present

## 2022-12-26 DIAGNOSIS — Z7901 Long term (current) use of anticoagulants: Secondary | ICD-10-CM

## 2022-12-26 DIAGNOSIS — E1100 Type 2 diabetes mellitus with hyperosmolarity without nonketotic hyperglycemic-hyperosmolar coma (NKHHC): Secondary | ICD-10-CM | POA: Diagnosis not present

## 2022-12-26 DIAGNOSIS — E860 Dehydration: Secondary | ICD-10-CM | POA: Diagnosis not present

## 2022-12-26 DIAGNOSIS — Z794 Long term (current) use of insulin: Secondary | ICD-10-CM

## 2022-12-26 DIAGNOSIS — N400 Enlarged prostate without lower urinary tract symptoms: Secondary | ICD-10-CM | POA: Diagnosis present

## 2022-12-26 DIAGNOSIS — R5381 Other malaise: Secondary | ICD-10-CM | POA: Diagnosis not present

## 2022-12-26 DIAGNOSIS — Z89511 Acquired absence of right leg below knee: Secondary | ICD-10-CM | POA: Diagnosis not present

## 2022-12-26 DIAGNOSIS — E119 Type 2 diabetes mellitus without complications: Secondary | ICD-10-CM | POA: Insufficient documentation

## 2022-12-26 DIAGNOSIS — R7989 Other specified abnormal findings of blood chemistry: Secondary | ICD-10-CM

## 2022-12-26 DIAGNOSIS — N390 Urinary tract infection, site not specified: Secondary | ICD-10-CM

## 2022-12-26 DIAGNOSIS — E876 Hypokalemia: Secondary | ICD-10-CM | POA: Diagnosis not present

## 2022-12-26 DIAGNOSIS — E785 Hyperlipidemia, unspecified: Secondary | ICD-10-CM | POA: Diagnosis present

## 2022-12-26 DIAGNOSIS — M109 Gout, unspecified: Secondary | ICD-10-CM | POA: Diagnosis present

## 2022-12-26 DIAGNOSIS — I2489 Other forms of acute ischemic heart disease: Secondary | ICD-10-CM | POA: Diagnosis present

## 2022-12-26 DIAGNOSIS — R627 Adult failure to thrive: Secondary | ICD-10-CM | POA: Diagnosis present

## 2022-12-26 DIAGNOSIS — D649 Anemia, unspecified: Secondary | ICD-10-CM | POA: Diagnosis present

## 2022-12-26 DIAGNOSIS — N179 Acute kidney failure, unspecified: Secondary | ICD-10-CM | POA: Diagnosis present

## 2022-12-26 DIAGNOSIS — Z833 Family history of diabetes mellitus: Secondary | ICD-10-CM | POA: Diagnosis not present

## 2022-12-26 DIAGNOSIS — Z79899 Other long term (current) drug therapy: Secondary | ICD-10-CM | POA: Diagnosis not present

## 2022-12-26 DIAGNOSIS — R739 Hyperglycemia, unspecified: Secondary | ICD-10-CM | POA: Insufficient documentation

## 2022-12-26 DIAGNOSIS — R748 Abnormal levels of other serum enzymes: Secondary | ICD-10-CM

## 2022-12-26 DIAGNOSIS — E1165 Type 2 diabetes mellitus with hyperglycemia: Secondary | ICD-10-CM

## 2022-12-26 LAB — CBC WITH DIFFERENTIAL/PLATELET
Abs Immature Granulocytes: 0.14 10*3/uL — ABNORMAL HIGH (ref 0.00–0.07)
Basophils Absolute: 0 10*3/uL (ref 0.0–0.1)
Basophils Relative: 0 %
Eosinophils Absolute: 0 10*3/uL (ref 0.0–0.5)
Eosinophils Relative: 0 %
HCT: 29.3 % — ABNORMAL LOW (ref 39.0–52.0)
Hemoglobin: 10 g/dL — ABNORMAL LOW (ref 13.0–17.0)
Immature Granulocytes: 2 %
Lymphocytes Relative: 13 %
Lymphs Abs: 0.8 10*3/uL (ref 0.7–4.0)
MCH: 28.2 pg (ref 26.0–34.0)
MCHC: 34.1 g/dL (ref 30.0–36.0)
MCV: 82.8 fL (ref 80.0–100.0)
Monocytes Absolute: 0.7 10*3/uL (ref 0.1–1.0)
Monocytes Relative: 11 %
Neutro Abs: 4.6 10*3/uL (ref 1.7–7.7)
Neutrophils Relative %: 74 %
Platelets: 201 10*3/uL (ref 150–400)
RBC: 3.54 MIL/uL — ABNORMAL LOW (ref 4.22–5.81)
RDW: 13.2 % (ref 11.5–15.5)
WBC: 6.3 10*3/uL (ref 4.0–10.5)
nRBC: 0 % (ref 0.0–0.2)

## 2022-12-26 LAB — COMPREHENSIVE METABOLIC PANEL
ALT: 9 U/L (ref 0–44)
AST: 12 U/L — ABNORMAL LOW (ref 15–41)
Albumin: 3.1 g/dL — ABNORMAL LOW (ref 3.5–5.0)
Alkaline Phosphatase: 61 U/L (ref 38–126)
Anion gap: 10 (ref 5–15)
BUN: 49 mg/dL — ABNORMAL HIGH (ref 8–23)
CO2: 24 mmol/L (ref 22–32)
Calcium: 8.3 mg/dL — ABNORMAL LOW (ref 8.9–10.3)
Chloride: 93 mmol/L — ABNORMAL LOW (ref 98–111)
Creatinine, Ser: 2.44 mg/dL — ABNORMAL HIGH (ref 0.61–1.24)
GFR, Estimated: 27 mL/min — ABNORMAL LOW (ref >60)
Glucose, Bld: 580 mg/dL (ref 70–99)
Potassium: 4.2 mmol/L (ref 3.5–5.1)
Sodium: 127 mmol/L — ABNORMAL LOW (ref 135–145)
Total Bilirubin: 0.8 mg/dL (ref 0.3–1.2)
Total Protein: 6.9 g/dL (ref 6.5–8.1)

## 2022-12-26 LAB — CBG MONITORING, ED
Glucose-Capillary: 570 mg/dL (ref 70–99)
Glucose-Capillary: 533 mg/dL (ref 70–99)
Glucose-Capillary: 491 mg/dL — ABNORMAL HIGH (ref 70–99)
Glucose-Capillary: 401 mg/dL — ABNORMAL HIGH (ref 70–99)
Glucose-Capillary: 451 mg/dL — ABNORMAL HIGH (ref 70–99)

## 2022-12-26 LAB — LACTIC ACID, PLASMA
Lactic Acid, Venous: 1.1 mmol/L (ref 0.5–1.9)
Lactic Acid, Venous: 1.5 mmol/L (ref 0.5–1.9)

## 2022-12-26 LAB — URINALYSIS, ROUTINE W REFLEX MICROSCOPIC
Bilirubin Urine: NEGATIVE
Glucose, UA: >1000 mg/dL — AB
Ketones, ur: NEGATIVE mg/dL
Nitrite: NEGATIVE
Protein, ur: 30 mg/dL — AB
Specific Gravity, Urine: 1.016 (ref 1.005–1.030)
WBC, UA: >50 WBC/hpf (ref 0–5)
pH: 5.5 (ref 5.0–8.0)

## 2022-12-26 LAB — TROPONIN I (HIGH SENSITIVITY)
Troponin I (High Sensitivity): 50 ng/L — ABNORMAL HIGH (ref <18)
Troponin I (High Sensitivity): 39 ng/L — ABNORMAL HIGH (ref <18)

## 2022-12-26 LAB — GLUCOSE, CAPILLARY
Glucose-Capillary: 160 mg/dL — ABNORMAL HIGH (ref 70–99)
Glucose-Capillary: 165 mg/dL — ABNORMAL HIGH (ref 70–99)
Glucose-Capillary: 222 mg/dL — ABNORMAL HIGH (ref 70–99)

## 2022-12-26 LAB — LIPASE, BLOOD: Lipase: 58 U/L — ABNORMAL HIGH (ref 11–51)

## 2022-12-26 LAB — MAGNESIUM: Magnesium: 2.3 mg/dL (ref 1.7–2.4)

## 2022-12-26 LAB — CK: Total CK: 122 U/L (ref 49–397)

## 2022-12-26 LAB — AMMONIA: Ammonia: 30 umol/L (ref 9–35)

## 2022-12-26 LAB — TSH: TSH: 0.995 u[IU]/mL (ref 0.350–4.500)

## 2022-12-26 MED ORDER — DILTIAZEM HCL-DEXTROSE 125-5 MG/125ML-% IV SOLN (PREMIX)
5.0000 mg/h | INTRAVENOUS | Status: DC
  Administered 2022-12-26: 5 mg/h via INTRAVENOUS
  Filled 2022-12-26: qty 125

## 2022-12-26 MED ORDER — HEPARIN (PORCINE) 25000 UT/250ML-% IV SOLN
1400.0000 [IU]/h | INTRAVENOUS | Status: DC
  Administered 2022-12-26: 1250 [IU]/h via INTRAVENOUS
  Filled 2022-12-26: qty 250

## 2022-12-26 MED ORDER — SODIUM CHLORIDE 0.9 % IV BOLUS
1000.0000 mL | Freq: Once | INTRAVENOUS | Status: AC
  Administered 2022-12-26: 1000 mL via INTRAVENOUS

## 2022-12-26 MED ORDER — DEXTROSE 50 % IV SOLN: 0.0000 mL | INTRAVENOUS | Status: DC | PRN

## 2022-12-26 MED ORDER — INSULIN REGULAR(HUMAN) IN NACL 100-0.9 UT/100ML-% IV SOLN
INTRAVENOUS | Status: DC
  Administered 2022-12-26: 5 [IU]/h via INTRAVENOUS
  Filled 2022-12-26: qty 100

## 2022-12-26 MED ORDER — DEXTROSE IN LACTATED RINGERS 5 % IV SOLN: INTRAVENOUS | Status: DC

## 2022-12-26 MED ORDER — DILTIAZEM HCL 25 MG/5ML IV SOLN
10.0000 mg | Freq: Once | INTRAVENOUS | Status: AC
  Administered 2022-12-26: 10 mg via INTRAVENOUS
  Filled 2022-12-26: qty 5

## 2022-12-26 MED ORDER — LACTATED RINGERS IV BOLUS
1000.0000 mL | Freq: Once | INTRAVENOUS | Status: AC
  Administered 2022-12-26: 1000 mL via INTRAVENOUS

## 2022-12-26 MED ORDER — LACTATED RINGERS IV SOLN: INTRAVENOUS | Status: DC

## 2022-12-26 MED ORDER — SODIUM CHLORIDE 0.9 % IV SOLN
1.0000 g | Freq: Once | INTRAVENOUS | Status: AC
  Administered 2022-12-26: 1 g via INTRAVENOUS
  Filled 2022-12-26: qty 10

## 2022-12-26 NOTE — ED Notes (Signed)
UA, cxr, admit for AKI/afib/glucose, fatigue

## 2022-12-26 NOTE — ED Notes (Signed)
Pt stands to use urinal with this RN at bedside. Changed of stool soiled brief. Clean clothes given. Returns to bed without incident. Monitor reads HR 140's. EKG taken and given to [place]. Pt reports no changes in feeling, no SOB, no CP. IV pump reads occluded. IV flushed and appears to infiltrate with NS flush.

## 2022-12-26 NOTE — ED Notes (Signed)
CBG 419 

## 2022-12-26 NOTE — ED Provider Notes (Signed)
[place] EMERGENCY DEPARTMENT AT [facility] Provider Note   CSN: [id number] Arrival date & time: 12/26/22  1110     History  Chief Complaint  Patient presents with   Failure To Thrive    Cory [name] is a 77 y.o. male.  The history is provided by the patient, a relative and medical records. No language interpreter was used.  Illness Location:  Generalized fatigue, decreased appetite Severity:  Severe Onset quality:  Gradual Duration:  2 weeks Timing:  Constant Progression:  Waxing and waning Associated symptoms: fatigue and nausea   Associated symptoms: no abdominal pain, no chest pain, no congestion, no cough, no diarrhea, no fever, no headaches, no loss of consciousness, no rash, no rhinorrhea, no shortness of breath, no vomiting and no wheezing        Home Medications Prior to Admission medications   Medication Sig Start Date End Date Taking? Authorizing Provider  Accu-Chek Softclix Lancets lancets USE AS directed to test blood sugar daily 11/02/20     acetaminophen (TYLENOL) 325 MG tablet Take 2 tablets (650 mg total) by mouth every 6 (six) hours as needed for mild pain, headache or fever (or Fever >/= 101). 12/07/20   Almon Hercules, MD  atorvastatin (LIPITOR) 20 MG tablet Take 20 mg by mouth at bedtime. 09/14/20   [provider]  Blood Glucose Monitoring Suppl (BLOOD GLUCOSE MONITOR SYSTEM) w/Device KIT 1 (one) Kit as directed 11/02/20     colchicine 0.6 MG tablet Take 1 tablet (0.6 mg total) by mouth daily as needed (for gout flare). 12/07/20   Almon Hercules, MD  dapagliflozin propanediol (FARXIGA) 10 MG TABS tablet Take 1 tablet by mouth every morning 11/02/20     Dulaglutide (TRULICITY) 0.75 MG/0.5ML SOPN 1 (one) Solution Pen-injector under skin once a week 11/02/20     glucose blood test strip use as directed to test blood sugar daily 11/02/20     Insulin Pen Needle (B-D UF III MINI PEN NEEDLES) 31G X 5 MM MISC 1 (one) Each daily 11/02/20     LANTUS  SOLOSTAR 100 UNIT/ML Solostar Pen Inject 45 Units into the skin daily. 11/11/20   [provider]  losartan-hydrochlorothiazide (HYZAAR) 100-25 MG tablet Take 1 tablet by mouth daily. 09/14/20   [provider]  metFORMIN (GLUCOPHAGE) 1000 MG tablet Take 1,000 mg by mouth 2 (two) times daily. 06/15/20   [provider]  metoprolol succinate (TOPROL-XL) 100 MG 24 hr tablet Take 100 mg by mouth daily. 11/09/20   [provider]  NIFEdipine (PROCARDIA-XL/NIFEDICAL-XL) 30 MG 24 hr tablet Take 30 mg by mouth daily. 09/16/20   [provider]  tamsulosin (FLOMAX) 0.4 MG CAPS capsule Take 1 capsule (0.4 mg total) by mouth daily. 12/07/20   Almon Hercules, MD  furosemide (LASIX) 20 MG tablet Take 1-2 tablets in the morning for your ankle and foot swelling. Patient not taking: No sig reported 11/12/20 11/27/20  Mardella Layman, MD      Allergies    Patient has no known allergies.    Review of Systems   Review of Systems  Constitutional:  Positive for fatigue. Negative for chills, diaphoresis and fever.  HENT:  Negative for congestion and rhinorrhea.   Eyes:  Negative for visual disturbance.  Respiratory:  Negative for cough, chest tightness, shortness of breath and wheezing.   Cardiovascular:  Negative for chest pain.  Gastrointestinal:  Positive for nausea. Negative for abdominal pain, constipation, diarrhea and vomiting.  Genitourinary:  Negative for dysuria and flank pain.  Musculoskeletal:  Negative for back pain, neck pain and neck stiffness.  Skin:  Negative for rash and wound.  Neurological:  Positive for light-headedness. Negative for loss of consciousness, weakness, numbness and headaches.  Psychiatric/Behavioral:  Negative for agitation and confusion.   All other systems reviewed and are negative.   Physical Exam Updated Vital Signs BP 122/69 (BP Location: Right Arm)   Pulse 74   Temp 98 F (36.7 C) (Oral)   Resp 15   SpO2 100%  Physical Exam Vitals  and nursing note reviewed.  Constitutional:      General: He is not in acute distress.    Appearance: He is well-developed. He is not ill-appearing, toxic-appearing or diaphoretic.  HENT:     Head: Normocephalic and atraumatic.     Nose: No congestion or rhinorrhea.     Mouth/Throat:     Mouth: Mucous membranes are dry.     Pharynx: No oropharyngeal exudate or posterior oropharyngeal erythema.  Eyes:     Conjunctiva/sclera: Conjunctivae normal.  Cardiovascular:     Rate and Rhythm: Normal rate and regular rhythm.     Heart sounds: No murmur heard. Pulmonary:     Effort: Pulmonary effort is normal. No respiratory distress.     Breath sounds: Normal breath sounds. No wheezing, rhonchi or rales.  Chest:     Chest wall: No tenderness.  Abdominal:     General: Abdomen is flat.     Palpations: Abdomen is soft.     Tenderness: There is no abdominal tenderness. There is no right CVA tenderness, left CVA tenderness, guarding or rebound.  Musculoskeletal:        General: No swelling or tenderness.     Cervical back: Neck supple. No tenderness.     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     Left lower leg: No edema.  Skin:    General: Skin is warm and dry.     Capillary Refill: Capillary refill takes less than 2 seconds.     Findings: No erythema or rash.  Neurological:     General: No focal deficit present.     Mental Status: He is alert.     Sensory: No sensory deficit.     Motor: No weakness.  Psychiatric:        Mood and Affect: Mood normal.     ED Results / Procedures / Treatments   Labs (all labs ordered are listed, but only abnormal results are displayed) Labs Reviewed  CBC WITH DIFFERENTIAL/PLATELET - Abnormal; Notable for the following components:      Result Value   RBC 3.54 (*)    Hemoglobin 10.0 (*)    HCT 29.3 (*)    Abs Immature Granulocytes 0.14 (*)    All other components within normal limits  COMPREHENSIVE METABOLIC PANEL - Abnormal; Notable  for the following components:   Sodium 127 (*)    Chloride 93 (*)    Glucose, Bld 580 (*)    BUN 49 (*)    Creatinine, Ser 2.44 (*)    Calcium 8.3 (*)    Albumin 3.1 (*)    AST 12 (*)    GFR, Estimated 27 (*)    All other components within normal limits  CBG MONITORING, ED - Abnormal; Notable for the following components:   Glucose-Capillary 570 (*)    All other components within normal limits  URINALYSIS, ROUTINE W REFLEX MICROSCOPIC  LACTIC ACID, PLASMA  LACTIC ACID, PLASMA  LIPASE, BLOOD  CK  AMMONIA  TSH  CBG MONITORING, ED    EKG EKG Interpretation Date/Time:  Monday December 26 2022 14:30:57 EDT Ventricular Rate:  71 PR Interval:  143 QRS Duration:  87 QT Interval:  429 QTC Calculation: 467 R Axis:   36  Text Interpretation: Sinus rhythm Borderline T abnormalities, inferior leads no prior ECG for comparison No STEMI Confirmed by Theda Belfast ([id number]) on 12/26/2022 2:35:32 PM  EKG Interpretation Date/Time:  Monday December 26 2022 14:30:57 EDT Ventricular Rate:  71 PR Interval:  143 QRS Duration:  87 QT Interval:  429 QTC Calculation: 467 R Axis:   36  Text Interpretation: Sinus rhythm Borderline T abnormalities, inferior leads no prior ECG for comparison No STEMI Confirmed by Theda Belfast ([id number]) on 12/26/2022 2:35:32 PM  Radiology CT Head Wo Contrast  Result Date: 12/26/2022 CLINICAL DATA:  Mental status change, unknown cause EXAM: CT HEAD WITHOUT CONTRAST TECHNIQUE: Contiguous axial images were obtained from the base of the skull through the vertex without intravenous contrast. RADIATION DOSE REDUCTION: This exam was performed according to the departmental dose-optimization program which includes automated exposure control, adjustment of the mA and/or kV according to patient size and/or use of iterative reconstruction technique. COMPARISON:  CT Head 11/07/22 FINDINGS: Brain: No evidence of acute infarction, hemorrhage, hydrocephalus, extra-axial collection or mass  lesion/mass effect. Sequela of moderate chronic microvascular ischemic change. Vascular: No hyperdense vessel or unexpected calcification. Skull: Normal. Negative for fracture or focal lesion. Sinuses/Orbits: No middle ear or mastoid effusion. Paranasal sinuses are clear. Bilateral lens replacement. Orbits are otherwise unremarkable. Other: None. IMPRESSION: No acute intracranial abnormality. Electronically Signed   By: Lorenza Cambridge M.D.   On: 12/26/2022 14:02    Procedures Procedures    Medications Ordered in ED Medications  insulin regular, human (MYXREDLIN) 100 units/ 100 mL infusion (5 Units/hr Intravenous Infusion Verify 12/26/22 1518)  lactated ringers infusion ( Intravenous New Bag/Given 12/26/22 1429)  dextrose 5 % in lactated ringers infusion (has no administration in time range)  dextrose 50 % solution 0-50 mL (has no administration in time range)  sodium chloride 0.9 % bolus 1,000 mL (0 mLs Intravenous Stopped 12/26/22 1430)    ED Course/ Medical Decision Making/ A&P                                 Medical Decision Making Amount and/or Complexity of Data Reviewed Labs: ordered. Radiology: ordered.  Risk Prescription drug management. Decision regarding hospitalization.    Cory [name] is a 77 y.o. male with a past medical history significant for hypertension, hyperlipidemia, and diabetes who presents with 2 weeks of decreased oral intake, fatigue, confusion, darkened urine, and dry mouth.  According to family, the last 2 weeks he has had no appetite.  He had some nausea and vomiting earlier but that is resolved.  He is not any abdominal pain or chest pain.  He does report some mild shortness of breath but reports in the last week he has not been getting out of bed hardly at all so has not had any exertion to provoke shortness of breath.  His niece came to see him today and he had not gotten out of bed had not cook for himself and has been just lying in bed with no energy for  days.  He said that he has not had any falls or injuries recently, has no energy.  He reports his urine has been darker but no dysuria.  Denies any palpitations or recent medication changes.  He says that his glucoses are either very high or very low and has been as low as the 70s and highest the 500s which she is found to have in the 500s on arrival.   Otherwise he denies constipation, diarrhea, fevers, chills, or sick contacts.  On exam, lungs were clear.  Chest nontender.  Abdomen nontender.  Patient has very dry mucous membranes.  Patient had intact sensation and strength in extremities.  Had symmetric smile.  Pupils were asymmetric but he had cataract surgery and it is unchanged her baseline in the right eye.  Normal extraocular movements.  Family reports that he has had some intermittent confusion recently but otherwise no mental status changes other than fatigue.  Patient had some screening labs in triage that revealed he does have acute kidney injury with creatinine down to 2.4 up from 0.9 previously.  His glucose is found to be 580 although I do not see DKA with a normal anion gap.  His white blood cell count was normal and he has mild anemia.  Will get urinalysis given his urinary changes, will get ammonia with the confusion and fatigue and also get CK as he is been laying in the same bed without moving.  Will get TSH and lactic acid.  Will get lipase with the nausea and vomiting.  Will give some fluids but due to altered mental status, decreased mobility, fatigue, AKI, and hyperglycemia, he will likely need admission.  Will get chest x-ray for the cough and CT head for the altered mental status per family.  Will give fluids and then will order glucose stabilizer for the hyperglycemia.  Anticipate admission after workup is completed.  3:26 PM Patient was stood up to try to go to the bathroom and heart rate jumped in the 140s and appears to show either a ectopic atrial tachycardia versus a  flutter with RVR.  New EKG does show evidence of new atrial flutter.  Sitting down his heart rate has improved, will get repeat EKG.  He will still need admission after workup is completed.  Care transferred to oncoming team.         Final Clinical Impression(s) / ED Diagnoses Final diagnoses:  AKI (acute kidney injury) (HCC)  Dehydration  Hyperglycemia     Clinical Impression: 1. AKI (acute kidney injury) (HCC)   2. Dehydration   3. Hyperglycemia     Disposition: Admit  This note was prepared with assistance of Dragon voice recognition software. Occasional wrong-word or sound-a-like substitutions may have occurred due to the inherent limitations of voice recognition software.     Kayce Chismar, Canary Brim, MD 12/26/22 [phone]

## 2022-12-26 NOTE — Plan of Care (Signed)

## 2022-12-26 NOTE — ED Provider Notes (Addendum)
  Physical Exam  BP 135/84   Pulse 66   Temp 97.7 F (36.5 C) (Oral)   Resp (!) 21   Wt 88.5 kg   SpO2 98%   BMI 27.20 kg/m     Procedures  .Critical Care  Performed by: Ernie Avena, MD Authorized by: Ernie Avena, MD   Critical care provider statement:    Critical care time (minutes):  30   Critical care was time spent personally by me on the following activities:  Development of treatment plan with patient or surrogate, discussions with consultants, evaluation of patient's response to treatment, examination of patient, ordering and review of laboratory studies, ordering and review of radiographic studies, ordering and performing treatments and interventions, pulse oximetry, re-evaluation of patient's condition and review of old charts   Care discussed with: admitting provider     ED Course / MDM    Medical Decision Making Amount and/or Complexity of Data Reviewed Labs: ordered. Radiology: ordered.  Risk Prescription drug management. Decision regarding hospitalization.   36M, presenting with hyperglycemia, dehydration, AKI. Appears to be in aflutter with RVR. Got fluids an insulin gtt. Waiting on urine and CXR.  CXR clear, UA with evidence of UTI. Rocephin ordered. Pt in atrial flutter, in and out of RVR. Getting IVF, hyperglycemic, not in DKA. Has dehydration and an AKI. Pt started on heparin, Diltiazem bolus ordered.  Hospitalist medicine consulted for admission, Dr. Alinda Money accepting.        Ernie Avena, MD 12/26/22 1639    Ernie Avena, MD 12/26/22 [phone]

## 2022-12-26 NOTE — Progress Notes (Signed)
ANTICOAGULATION CONSULT NOTE - Initial Consult  Pharmacy Consult for Heparin Indication:  atrial flutter  No Known Allergies  Patient Measurements: Weight: 88.5 kg (195 lb) Heparin Dosing Weight: 88.5 kg  Vital Signs: Temp: 97.7 F (36.5 C) (08/19 1521) Temp Source: Oral (08/19 1521) BP: 119/85 (08/19 1600) Pulse Rate: 138 (08/19 1600)  Labs: Recent Labs    12/26/22 1157 12/26/22 1406  HGB 10.0*  --   HCT 29.3*  --   PLT 201  --   CREATININE 2.44*  --   CKTOTAL  --  122    Estimated Creatinine Clearance: 27.9 mL/min (A) (by C-G formula based on SCr of 2.44 mg/dL (H)).   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 Patient is presenting with hyperglycemia, dehydration, AKI. Noted to be in atrial flutter with RVR in the ED. Heparin per pharmacy consult placed for  atrial flutter .  Patient is not on anticoagulation prior to arrival.  Hgb 10; plt 201  Goal of Therapy:  Heparin level 0.3-0.7 units/ml Monitor platelets by anticoagulation protocol: Yes   Plan:  No initial heparin bolus (Hgb 10) Start heparin infusion at 1250 units/hr Check anti-Xa level in 8 hours and daily while on heparin Continue to monitor H&H and platelets  Delmar Landau, PharmD, BCPS 12/26/2022 4:43 PM ED Clinical Pharmacist -  [phone]

## 2022-12-26 NOTE — ED Triage Notes (Signed)
Pt arrives to ED with c/o failure to thrive. Pt notes he has not had an appetite x2 weeks and is not eating. Pt denies any pain when eating and notes he is unsure why he has no appetite. Family does note new onset confusion over the past two weeks. Associated with weakness.

## 2022-12-26 NOTE — ED Notes (Signed)
ED TO INPATIENT HANDOFF REPORT  ED Nurse Name and Phone #:    S Name/Age/Gender [facility] 77 y.o. male Room/Bed: DB003/DB003  Code Status   Code Status: Prior  Home/SNF/Other Home Patient oriented to: self, place, time, and situation Is this baseline? Yes   Triage Complete: Triage complete  Chief Complaint AKI (acute kidney injury) [facility]) [N17.9]  Triage Note Pt arrives to ED with c/o failure to thrive. Pt notes he has not had an appetite x2 weeks and is not eating. Pt denies any pain when eating and notes he is unsure why he has no appetite. Family does note new onset confusion over the past two weeks. Associated with weakness.    Allergies No Known Allergies  Level of Care/Admitting Diagnosis ED Disposition     ED Disposition  Admit   Condition  --   Comment  Hospital Area: [facility] [[id number]]  Level of Care: Progressive [102]  Admit to Progressive based on following criteria: GI, ENDOCRINE disease patients with GI bleeding, acute liver failure or pancreatitis, stable with diabetic ketoacidosis or thyrotoxicosis (hypothyroid) state.  Admit to Progressive based on following criteria: NEPHROLOGY stable condition requiring close monitoring for AKI, requiring Hemodialysis or Peritoneal Dialysis either from expected electrolyte imbalance, acidosis, or fluid overload that can be managed by NIPPV or high flow oxygen.  Admit to Progressive based on following criteria: Other see comments  Comments: Insulin gtt  Interfacility transfer: Yes  May place patient in observation at [facility] or [facility] if equivalent level of care is available:: Yes  Covid Evaluation: Asymptomatic - no recent exposure (last 10 days) testing not required  Diagnosis: AKI (acute kidney injury) [facility]) [[id number]]  Admitting Physician: Synetta Fail [[id number]]  Attending Physician: Synetta Fail [[id number]]          B Medical/Surgery History Past Medical History:   Diagnosis Date   Diabetes mellitus without complication (HCC)    Hyperlipidemia    Hypertension    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   Sepsis (HCC) 11/27/2020   Past Surgical History:  Procedure Laterality Date   AMPUTATION Right 12/04/2020   Procedure: RIGHT BELOW KNEE AMPUTATION;  Surgeon: Nadara Mustard, MD;  Location: [facility] OR;  Service: Orthopedics;  Laterality: Right;   APPENDECTOMY     APPLICATION OF WOUND VAC Right 12/04/2020   Procedure: APPLICATION OF WOUND VAC;  Surgeon: Nadara Mustard, MD;  Location: [facility] OR;  Service: Orthopedics;  Laterality: Right;     A IV Location/Drains/Wounds Patient Lines/Drains/Airways Status     Active Line/Drains/Airways     Name Placement date Placement time Site Days   Peripheral IV 12/26/22 18 G 1.88" Anterior;Right;Upper Arm 12/26/22  1557  Arm  less than 1   Peripheral IV 12/26/22 18 G 1.88" Anterior;Left;Proximal;Upper Arm 12/26/22  1652  Arm  less than 1   Midline Single Lumen 12/01/20 Left Basilic 8 cm 0 cm 12/01/20  0115  Basilic  755   Negative Pressure Wound Therapy Knee Right;Circumferential 12/04/20  0846  --  752   External Urinary Catheter 11/28/20  1000  --  758            Intake/Output Last 24 hours  Intake/Output Summary (Last 24 hours) at 12/26/2022 1816 Last data filed at 12/26/2022 1734 Gross per 24 hour  Intake 11.83 ml  Output --  Net 11.83 ml    Labs/Imaging Results for orders placed or performed during the hospital encounter of 12/26/22 (from the  past 48 hour(s))  CBG monitoring, ED     Status: Abnormal   Collection Time: 12/26/22 11:51 AM  Result Value Ref Range   Glucose-Capillary 570 (HH) 70 - 99 mg/dL    Comment: Glucose reference range applies only to samples taken after fasting for at least 8 hours.   Comment 1 OK   CBC with Differential     Status: Abnormal   Collection Time: 12/26/22 11:57 AM  Result Value Ref Range   WBC 6.3 4.0 - 10.5 K/uL   RBC 3.54 (L) 4.22 - 5.81 MIL/uL    Hemoglobin 10.0 (L) 13.0 - 17.0 g/dL   HCT 16.1 (L) 09.6 - 04.5 %   MCV 82.8 80.0 - 100.0 fL   MCH 28.2 26.0 - 34.0 pg   MCHC 34.1 30.0 - 36.0 g/dL   RDW 40.9 81.1 - 91.4 %   Platelets 201 150 - 400 K/uL   nRBC 0.0 0.0 - 0.2 %   Neutrophils Relative % 74 %   Neutro Abs 4.6 1.7 - 7.7 K/uL   Lymphocytes Relative 13 %   Lymphs Abs 0.8 0.7 - 4.0 K/uL   Monocytes Relative 11 %   Monocytes Absolute 0.7 0.1 - 1.0 K/uL   Eosinophils Relative 0 %   Eosinophils Absolute 0.0 0.0 - 0.5 K/uL   Basophils Relative 0 %   Basophils Absolute 0.0 0.0 - 0.1 K/uL   Immature Granulocytes 2 %   Abs Immature Granulocytes 0.14 (H) 0.00 - 0.07 K/uL    Comment: Performed at [facility], [address] [place], [place] [address]  Comprehensive metabolic panel     Status: Abnormal   Collection Time: 12/26/22 11:57 AM  Result Value Ref Range   Sodium 127 (L) 135 - 145 mmol/L   Potassium 4.2 3.5 - 5.1 mmol/L   Chloride 93 (L) 98 - 111 mmol/L   CO2 24 22 - 32 mmol/L   Glucose, Bld 580 (HH) 70 - 99 mg/dL    Comment: Glucose reference range applies only to samples taken after fasting for at least 8 hours. CRITICAL RESULT CALLED TO, READ BACK BY AND VERIFIED WITH: MADISON FOUNTAIN AT 1239 ON 12/26/2022 BY ALA    BUN 49 (H) 8 - 23 mg/dL   Creatinine, Ser 6.21 (H) 0.61 - 1.24 mg/dL   Calcium 8.3 (L) 8.9 - 10.3 mg/dL   Total Protein 6.9 6.5 - 8.1 g/dL   Albumin 3.1 (L) 3.5 - 5.0 g/dL   AST 12 (L) 15 - 41 U/L   ALT 9 0 - 44 U/L   Alkaline Phosphatase 61 38 - 126 U/L   Total Bilirubin 0.8 0.3 - 1.2 mg/dL   GFR, Estimated 27 (L) >60 mL/min    Comment: (NOTE) Calculated using the CKD-EPI Creatinine Equation (2021)    Anion gap 10 5 - 15    Comment: Performed at [facility], [address], [place], [place] [address]  Lactic acid, plasma     Status: None   Collection Time: 12/26/22  2:06 PM  Result Value Ref Range   Lactic Acid, Venous 1.1 0.5 - 1.9 mmol/L     Comment: Performed at [facility], [address] [place], [place] [address]  Lipase, blood     Status: Abnormal   Collection Time: 12/26/22  2:06 PM  Result Value Ref Range   Lipase 58 (H) 11 - 51 U/L    Comment: Performed at [facility], [address], [place], [place]  [address]  CK     Status: None   Collection Time: 12/26/22  2:06 PM  Result Value Ref Range   Total CK 122 49 - 397 U/L    Comment: Performed at [facility], [address], [place], [place] [address]  Ammonia     Status: None   Collection Time: 12/26/22  2:06 PM  Result Value Ref Range   Ammonia 30 9 - 35 umol/L    Comment: Performed at [facility], [address] [place], [place] [address]  TSH     Status: None   Collection Time: 12/26/22  2:06 PM  Result Value Ref Range   TSH 0.995 0.350 - 4.500 uIU/mL    Comment: Performed by a 3rd Generation assay with a functional sensitivity of <=0.01 uIU/mL. Performed at [facility], [address] [place], [place] [address]   CBG monitoring, ED     Status: Abnormal   Collection Time: 12/26/22  2:13 PM  Result Value Ref Range   Glucose-Capillary 533 (HH) 70 - 99 mg/dL    Comment: Glucose reference range applies only to samples taken after fasting for at least 8 hours.  CBG monitoring, ED     Status: Abnormal   Collection Time: 12/26/22  3:13 PM  Result Value Ref Range   Glucose-Capillary 491 (H) 70 - 99 mg/dL    Comment: Glucose reference range applies only to samples taken after fasting for at least 8 hours.  Urinalysis, Routine w reflex microscopic -Urine, Clean Catch     Status: Abnormal   Collection Time: 12/26/22  3:30 PM  Result Value Ref Range   Color, Urine YELLOW YELLOW   APPearance HAZY (A) CLEAR   Specific Gravity, Urine 1.016 1.005 - 1.030   pH 5.5 5.0 - 8.0   Glucose, UA >1,000 (A) NEGATIVE mg/dL   Hgb urine dipstick MODERATE (A) NEGATIVE    Bilirubin Urine NEGATIVE NEGATIVE   Ketones, ur NEGATIVE NEGATIVE mg/dL   Protein, ur 30 (A) NEGATIVE mg/dL   Nitrite NEGATIVE NEGATIVE   Leukocytes,Ua MODERATE (A) NEGATIVE   RBC / HPF 6-10 0 - 5 RBC/hpf   WBC, UA >50 0 - 5 WBC/hpf   Bacteria, UA MANY (A) NONE SEEN   Squamous Epithelial / HPF 0-5 0 - 5 /HPF   WBC Clumps PRESENT    Mucus PRESENT    Hyaline Casts, UA PRESENT     Comment: Performed at [facility], [address] [place], [place] [address]  CBG monitoring, ED     Status: Abnormal   Collection Time: 12/26/22  3:56 PM  Result Value Ref Range   Glucose-Capillary 451 (H) 70 - 99 mg/dL    Comment: Glucose reference range applies only to samples taken after fasting for at least 8 hours.  Lactic acid, plasma     Status: None   Collection Time: 12/26/22  3:58 PM  Result Value Ref Range   Lactic Acid, Venous 1.5 0.5 - 1.9 mmol/L    Comment: Performed at [facility], [address], [place], [place] [address]  Magnesium     Status: None   Collection Time: 12/26/22  3:58 PM  Result Value Ref Range   Magnesium 2.3 1.7 - 2.4 mg/dL    Comment: Performed at [facility], [address], [place], [place] [address]  Troponin I (High Sensitivity)     Status: Abnormal   Collection Time: 12/26/22  3:58 PM  Result Value Ref Range  Troponin I (High Sensitivity) 50 (H) <18 ng/L    Comment: (NOTE) Elevated high sensitivity troponin I (hsTnI) values and significant  changes across serial measurements may suggest ACS but many other  chronic and acute conditions are known to elevate hsTnI results.  Refer to the "Links" section for chest pain algorithms and additional  guidance. Performed at [facility], [address] [place], [place] [address]   CBG monitoring, ED     Status: Abnormal   Collection Time: 12/26/22  4:27 PM  Result Value Ref Range   Glucose-Capillary 401 (H) 70 - 99 mg/dL    Comment:  Glucose reference range applies only to samples taken after fasting for at least 8 hours.   DG Chest Portable 1 View  Result Date: 12/26/2022 CLINICAL DATA:  Shortness of breath EXAM: PORTABLE CHEST 1 VIEW COMPARISON:  Chest x-ray 11/26/2020 FINDINGS: The heart size and mediastinal contours are within normal limits. Both lungs are clear. The visualized skeletal structures are unremarkable. IMPRESSION: No active disease. Electronically Signed   By: Darliss Cheney M.D.   On: 12/26/2022 15:53   CT Head Wo Contrast  Result Date: 12/26/2022 CLINICAL DATA:  Mental status change, unknown cause EXAM: CT HEAD WITHOUT CONTRAST TECHNIQUE: Contiguous axial images were obtained from the base of the skull through the vertex without intravenous contrast. RADIATION DOSE REDUCTION: This exam was performed according to the departmental dose-optimization program which includes automated exposure control, adjustment of the mA and/or kV according to patient size and/or use of iterative reconstruction technique. COMPARISON:  CT Head 11/07/22 FINDINGS: Brain: No evidence of acute infarction, hemorrhage, hydrocephalus, extra-axial collection or mass lesion/mass effect. Sequela of moderate chronic microvascular ischemic change. Vascular: No hyperdense vessel or unexpected calcification. Skull: Normal. Negative for fracture or focal lesion. Sinuses/Orbits: No middle ear or mastoid effusion. Paranasal sinuses are clear. Bilateral lens replacement. Orbits are otherwise unremarkable. Other: None. IMPRESSION: No acute intracranial abnormality. Electronically Signed   By: Lorenza Cambridge M.D.   On: 12/26/2022 14:02    Pending Labs Unresulted Labs (From admission, onward)     Start     Ordered   12/27/22 0500  Heparin level (unfractionated)  Daily,   R      12/26/22 1648   12/27/22 0100  Heparin level (unfractionated)  Once-Timed,   URGENT        12/26/22 1648            Vitals/Pain Today's Vitals   12/26/22 1500 12/26/22  1521 12/26/22 1600 12/26/22 1644  BP: 127/74  119/85   Pulse: 71  (!) 138   Resp: 14  20   Temp:  97.7 F (36.5 C)    TempSrc:  Oral    SpO2: 99%  98%   Weight:    88.5 kg  Height:    5\' 11"  (1.803 m)  PainSc:        Isolation Precautions No active isolations  Medications Medications  insulin regular, human (MYXREDLIN) 100 units/ 100 mL infusion (4.6 Units/hr Intravenous Infusion Verify 12/26/22 1734)  lactated ringers infusion ( Intravenous New Bag/Given 12/26/22 1429)  dextrose 5 % in lactated ringers infusion (has no administration in time range)  dextrose 50 % solution 0-50 mL (has no administration in time range)  heparin ADULT infusion 100 units/mL (25000 units/222mL) (1,250 Units/hr Intravenous New Bag/Given 12/26/22 1706)  diltiazem (CARDIZEM) 125 mg in dextrose 5% 125 mL (1 mg/mL) infusion (5 mg/hr Intravenous New Bag/Given 12/26/22 1753)  sodium chloride 0.9 % bolus  1,000 mL (0 mLs Intravenous Stopped 12/26/22 1430)  lactated ringers bolus 1,000 mL (1,000 mLs Intravenous New Bag/Given 12/26/22 1701)  cefTRIAXone (ROCEPHIN) 1 g in sodium chloride 0.9 % 100 mL IVPB (1 g Intravenous New Bag/Given 12/26/22 1713)  diltiazem (CARDIZEM) injection 10 mg (10 mg Intravenous Given 12/26/22 1708)    Mobility walks with device     Focused Assessments Cardiac Assessment Handoff:    Lab Results  Component Value Date   CKTOTAL 122 12/26/2022   No results found for: "DDIMER" Does the Patient currently have chest pain? No           R Recommendations: See Admitting Provider Note  Report given to:   Additional Notes:

## 2022-12-26 NOTE — ED Notes (Signed)
CBG 491 

## 2022-12-26 NOTE — H&P (Signed)
History and Physical    Robie Vititoe JWJ:[id number] DOB: [birth year] DOA: 12/26/2022  PCP: Frederich Chick., MD  Patient coming from: [facility] ED  Chief Complaint: Decreased oral intake  HPI: Telvis Gorsuch is a 77 y.o. 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  Afebrile.  Labs showing no leukocytosis, hemoglobin 10.0 (stable), sodium 127, potassium 4.2, chloride 93, bicarb 24, anion gap 10, glucose 580, BUN 49, creatinine 2.4 (baseline 0.7-0.9), no elevation of LFTs, lipase 58, lactic acid normal x 2, CK normal, ammonia level normal, TSH normal, magnesium 2.3, troponin 50> 39.  UA showing no ketones but concerning for signs of infection (moderate leukocytes and microscopy showing >50 WBCs and many bacteria). CT head negative for acute intracranial abnormality.  Chest x-ray showing no active disease. While in the ED, patient developed atrial flutter with intermittent RVR with minimal exertion.  He was given IV Cardizem 10 mg and started on continuous infusion.  He was also started on heparin drip and given 2 L IV fluid boluses.  He was given ceftriaxone for possible UTI and started on IV insulin due to hyperglycemia.  Patient transferred to [facility] tonight.  Patient states he vomited for a day about 2 weeks ago.  Nausea and vomiting stopped, however, he lost his appetite and is barely eating or drinking anything since then.  Denies abdominal pain or diarrhea.  States he is supposed to be taking Lantus 50 units daily but due to not eating much, he has not been taking Lantus regularly.  He thinks he might have taken Lantus about 2 or 3 times in the past 2 weeks.  Denies fevers, chills, cough, shortness of breath, or any  urinary symptoms.  Review of Systems:  Review of Systems  All other systems reviewed and are negative.   Past Medical History:  Diagnosis Date   Diabetes mellitus without complication (HCC)    Hyperlipidemia    Hypertension    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   Sepsis (HCC) 11/27/2020    Past Surgical History:  Procedure Laterality Date   AMPUTATION Right 12/04/2020   Procedure: RIGHT BELOW KNEE AMPUTATION;  Surgeon: Nadara Mustard, MD;  Location: [facility] OR;  Service: Orthopedics;  Laterality: Right;   APPENDECTOMY     APPLICATION OF WOUND VAC Right 12/04/2020   Procedure: APPLICATION OF WOUND VAC;  Surgeon: Nadara Mustard, MD;  Location: [facility] OR;  Service: Orthopedics;  Laterality: Right;     reports that he has never smoked. He has never used smokeless tobacco. He reports that he does not drink alcohol and does not use drugs.  No Known Allergies  Family History  Problem Relation Age of Onset   Diabetes Mother    Diabetes Father     Prior to Admission medications   Medication Sig Start Date End Date Taking? Authorizing Provider  Accu-Chek Softclix Lancets lancets USE AS directed to test blood sugar daily 11/02/20     acetaminophen (TYLENOL) 325 MG tablet Take 2 tablets (650 mg total) by mouth every 6 (six) hours as needed for mild pain, headache or fever (or Fever >/= 101). 12/07/20   Almon Hercules, MD  atorvastatin (LIPITOR) 20 MG tablet Take 20 mg by mouth at bedtime. 09/14/20  [provider]  Blood Glucose Monitoring Suppl (BLOOD GLUCOSE MONITOR SYSTEM) w/Device KIT 1 (one) Kit as directed 11/02/20     colchicine 0.6 MG tablet Take 1 tablet (0.6 mg total) by mouth daily as needed (for gout flare). 12/07/20   Almon Hercules, MD  dapagliflozin propanediol (FARXIGA) 10 MG TABS tablet Take 1 tablet by mouth every morning 11/02/20     Dulaglutide (TRULICITY) 0.75 MG/0.5ML SOPN 1 (one) Solution Pen-injector under skin once a week 11/02/20     glucose blood test strip use  as directed to test blood sugar daily 11/02/20     Insulin Pen Needle (B-D UF III MINI PEN NEEDLES) 31G X 5 MM MISC 1 (one) Each daily 11/02/20     LANTUS SOLOSTAR 100 UNIT/ML Solostar Pen Inject 45 Units into the skin daily. 11/11/20   [provider]  losartan-hydrochlorothiazide (HYZAAR) 100-25 MG tablet Take 1 tablet by mouth daily. 09/14/20   [provider]  metFORMIN (GLUCOPHAGE) 1000 MG tablet Take 1,000 mg by mouth 2 (two) times daily. 06/15/20   [provider]  metoprolol succinate (TOPROL-XL) 100 MG 24 hr tablet Take 100 mg by mouth daily. 11/09/20   [provider]  NIFEdipine (PROCARDIA-XL/NIFEDICAL-XL) 30 MG 24 hr tablet Take 30 mg by mouth daily. 09/16/20   [provider]  tamsulosin (FLOMAX) 0.4 MG CAPS capsule Take 1 capsule (0.4 mg total) by mouth daily. 12/07/20   Almon Hercules, MD  furosemide (LASIX) 20 MG tablet Take 1-2 tablets in the morning for your ankle and foot swelling. Patient not taking: No sig reported 11/12/20 11/27/20  Mardella Layman, MD    Physical Exam: Vitals:   12/26/22 1900 12/26/22 1902 12/26/22 2035 12/26/22 2126  BP: 132/72  (!) 122/92 133/75  Pulse: 74   77  Resp: (!) 21  20 16   Temp:  98.1 F (36.7 C)  97.8 F (36.6 C)  TempSrc:  Oral  Oral  SpO2: 100%   95%  Weight:      Height:        Physical Exam Vitals reviewed.  Constitutional:      General: He is not in acute distress. HENT:     Head: Normocephalic and atraumatic.  Eyes:     Extraocular Movements: Extraocular movements intact.  Cardiovascular:     Rate and Rhythm: Normal rate and regular rhythm.     Pulses: Normal pulses.  Pulmonary:     Effort: Pulmonary effort is normal. No respiratory distress.     Breath sounds: Normal breath sounds. No wheezing or rales.  Abdominal:     General: Bowel sounds are normal. There is no distension.     Palpations: Abdomen is soft.     Tenderness: There is no abdominal tenderness. There is no guarding.   Musculoskeletal:     Cervical back: Normal range of motion.     Left lower leg: No edema.     Comments: Right BKA  Skin:    General: Skin is warm and dry.  Neurological:     General: No focal deficit present.     Mental Status: He is alert and oriented to person, place, and time.     Cranial Nerves: No cranial nerve deficit.     Sensory: No sensory deficit.     Motor: No weakness.     Labs on Admission: I have personally reviewed following labs and imaging studies  CBC: Recent Labs  Lab 12/26/22 1157  WBC 6.3  NEUTROABS  4.6  HGB 10.0*  HCT 29.3*  MCV 82.8  PLT 201   Basic Metabolic Panel: Recent Labs  Lab 12/26/22 1157 12/26/22 1558  NA 127*  --   K 4.2  --   CL 93*  --   CO2 24  --   GLUCOSE 580*  --   BUN 49*  --   CREATININE 2.44*  --   CALCIUM 8.3*  --   MG  --  2.3   GFR: Estimated Creatinine Clearance: 27.9 mL/min (A) (by C-G formula based on SCr of 2.44 mg/dL (H)). Liver Function Tests: Recent Labs  Lab 12/26/22 1157  AST 12*  ALT 9  ALKPHOS 61  BILITOT 0.8  PROT 6.9  ALBUMIN 3.1*   Recent Labs  Lab 12/26/22 1406  LIPASE 58*   Recent Labs  Lab 12/26/22 1406  AMMONIA 30   Coagulation Profile: No results for input(s): "INR", "PROTIME" in the last 168 hours. Cardiac Enzymes: Recent Labs  Lab 12/26/22 1406  CKTOTAL 122   BNP (last 3 results) No results for input(s): "PROBNP" in the last 8760 hours. HbA1C: No results for input(s): "HGBA1C" in the last 72 hours. CBG: Recent Labs  Lab 12/26/22 1513 12/26/22 1556 12/26/22 1627 12/26/22 2127 12/26/22 2233  GLUCAP 491* 451* 401* 222* 160*   Lipid Profile: No results for input(s): "CHOL", "HDL", "LDLCALC", "TRIG", "CHOLHDL", "LDLDIRECT" in the last 72 hours. Thyroid Function Tests: Recent Labs    12/26/22 1406  TSH 0.995   Anemia Panel: No results for input(s): "VITAMINB12", "FOLATE", "FERRITIN", "TIBC", "IRON", "RETICCTPCT" in the last 72 hours. Urine analysis:     Component Value Date/Time   COLORURINE YELLOW 12/26/2022 1530   APPEARANCEUR HAZY (A) 12/26/2022 1530   LABSPEC 1.016 12/26/2022 1530   PHURINE 5.5 12/26/2022 1530   GLUCOSEU >1,000 (A) 12/26/2022 1530   HGBUR MODERATE (A) 12/26/2022 1530   BILIRUBINUR NEGATIVE 12/26/2022 1530   KETONESUR NEGATIVE 12/26/2022 1530   PROTEINUR 30 (A) 12/26/2022 1530   NITRITE NEGATIVE 12/26/2022 1530   LEUKOCYTESUR MODERATE (A) 12/26/2022 1530    Radiological Exams on Admission: DG Chest Portable 1 View  Result Date: 12/26/2022 CLINICAL DATA:  Shortness of breath EXAM: PORTABLE CHEST 1 VIEW COMPARISON:  Chest x-ray 11/26/2020 FINDINGS: The heart size and mediastinal contours are within normal limits. Both lungs are clear. The visualized skeletal structures are unremarkable. IMPRESSION: No active disease. Electronically Signed   By: Darliss Cheney M.D.   On: 12/26/2022 15:53   CT Head Wo Contrast  Result Date: 12/26/2022 CLINICAL DATA:  Mental status change, unknown cause EXAM: CT HEAD WITHOUT CONTRAST TECHNIQUE: Contiguous axial images were obtained from the base of the skull through the vertex without intravenous contrast. RADIATION DOSE REDUCTION: This exam was performed according to the departmental dose-optimization program which includes automated exposure control, adjustment of the mA and/or kV according to patient size and/or use of iterative reconstruction technique. COMPARISON:  CT Head 11/07/22 FINDINGS: Brain: No evidence of acute infarction, hemorrhage, hydrocephalus, extra-axial collection or mass lesion/mass effect. Sequela of moderate chronic microvascular ischemic change. Vascular: No hyperdense vessel or unexpected calcification. Skull: Normal. Negative for fracture or focal lesion. Sinuses/Orbits: No middle ear or mastoid effusion. Paranasal sinuses are clear. Bilateral lens replacement. Orbits are otherwise unremarkable. Other: None. IMPRESSION: No acute intracranial abnormality. Electronically  Signed   By: Lorenza Cambridge M.D.   On: 12/26/2022 14:02    EKG: Independently reviewed.  Initial EKG showing sinus rhythm and borderline T wave abnormalities.  No previous EKG for comparison.  Repeat EKG showing atrial flutter with RVR.  Assessment and Plan  AKI Likely prerenal from dehydration given decreased oral intake for the past 2 weeks. BUN 49, creatinine 2.4 (baseline 0.7-0.9).  Continue IV fluid hydration and monitor renal function.  Avoid nephrotoxic agents/hold losartan and hydrochlorothiazide.  New onset atrial flutter with RVR Dehydration likely contributing.  Potassium 4.2 and magnesium 2.3.  Patient was started on Cardizem drip in the ED and given 2 L IV fluids.  Rate now***.  Continue heparin drip.  Echocardiogram ordered.  Poorly controlled insulin-dependent type 2 diabetes with hyperglycemia Glucose in the 500s without signs of DKA.  Last A1c 12.1 in July 2022, will repeat.  Patient was placed on insulin drip in the ED and glucose now improved to 160.  Stop insulin drip and order sensitive sliding scale insulin ACHS.  Continue home basal insulin after pharmacy med rec is done.  Possible UTI UA with moderate leukocytes and microscopy showing >50 WBCs and many bacteria.  No fever, leukocytosis, or signs of sepsis.  Continue ceftriaxone and order urine culture.  Acute metabolic encephalopathy Per triage note, family had reported confusion over the past 2 weeks.  This is likely related to dehydration and possible UTI.  CT head negative for acute intracranial abnormality and no focal neurodeficit on exam.  Ammonia level normal and TSH normal.  Elevated troponin Likely due to demand ischemia in the setting of new onset atrial flutter with RVR. Troponin 50> 39 and not consistent with ACS.  Borderline elevated lipase No abdominal pain or tenderness, nausea, or vomiting.  No elevation of LFTs.  Repeat lipase in the morning.  Generalized weakness PT/OT eval, fall  precautions.  Hypertension  Hyperlipidemia  BPH  Gout    Afebrile.     DVT prophylaxis: {Blank single:19197::"Lovenox","SQ Heparin","IV heparin gtt","Xarelto","Eliquis","Coumadin","SCDs","***"} Code Status: {Blank single:19197::"Full Code","DNR","DNR/DNI","Comfort Care","***"} Family Communication: ***  Consults called: ***  Level of care: {Blank single:19197::"Med-Surg","Telemetry bed","Progressive Care Unit","Step Down Unit"} Admission status: *** Time Spent: 75+ minutes***  John Giovanni MD [facility]  If 7PM-7AM, please contact night-coverage [email or web address]  12/26/2022, 10:36 PM

## 2022-12-27 ENCOUNTER — Other Ambulatory Visit (HOSPITAL_COMMUNITY): Payer: Self-pay

## 2022-12-27 ENCOUNTER — Observation Stay (HOSPITAL_COMMUNITY): Payer: Medicare HMO

## 2022-12-27 ENCOUNTER — Inpatient Hospital Stay (HOSPITAL_COMMUNITY): Payer: Medicare HMO

## 2022-12-27 DIAGNOSIS — D649 Anemia, unspecified: Secondary | ICD-10-CM | POA: Diagnosis present

## 2022-12-27 DIAGNOSIS — R531 Weakness: Secondary | ICD-10-CM

## 2022-12-27 DIAGNOSIS — R748 Abnormal levels of other serum enzymes: Secondary | ICD-10-CM | POA: Diagnosis not present

## 2022-12-27 DIAGNOSIS — N390 Urinary tract infection, site not specified: Secondary | ICD-10-CM

## 2022-12-27 DIAGNOSIS — E785 Hyperlipidemia, unspecified: Secondary | ICD-10-CM | POA: Diagnosis present

## 2022-12-27 DIAGNOSIS — Z833 Family history of diabetes mellitus: Secondary | ICD-10-CM | POA: Diagnosis not present

## 2022-12-27 DIAGNOSIS — E1165 Type 2 diabetes mellitus with hyperglycemia: Secondary | ICD-10-CM | POA: Diagnosis not present

## 2022-12-27 DIAGNOSIS — E876 Hypokalemia: Secondary | ICD-10-CM | POA: Diagnosis present

## 2022-12-27 DIAGNOSIS — M109 Gout, unspecified: Secondary | ICD-10-CM | POA: Diagnosis present

## 2022-12-27 DIAGNOSIS — Z794 Long term (current) use of insulin: Secondary | ICD-10-CM | POA: Diagnosis not present

## 2022-12-27 DIAGNOSIS — R7989 Other specified abnormal findings of blood chemistry: Secondary | ICD-10-CM

## 2022-12-27 DIAGNOSIS — I2489 Other forms of acute ischemic heart disease: Secondary | ICD-10-CM | POA: Diagnosis present

## 2022-12-27 DIAGNOSIS — E860 Dehydration: Secondary | ICD-10-CM | POA: Diagnosis present

## 2022-12-27 DIAGNOSIS — I4892 Unspecified atrial flutter: Secondary | ICD-10-CM

## 2022-12-27 DIAGNOSIS — R627 Adult failure to thrive: Secondary | ICD-10-CM | POA: Diagnosis present

## 2022-12-27 DIAGNOSIS — N179 Acute kidney failure, unspecified: Secondary | ICD-10-CM | POA: Diagnosis present

## 2022-12-27 DIAGNOSIS — N400 Enlarged prostate without lower urinary tract symptoms: Secondary | ICD-10-CM | POA: Diagnosis present

## 2022-12-27 DIAGNOSIS — G9341 Metabolic encephalopathy: Secondary | ICD-10-CM

## 2022-12-27 DIAGNOSIS — I10 Essential (primary) hypertension: Secondary | ICD-10-CM | POA: Diagnosis present

## 2022-12-27 DIAGNOSIS — Z7901 Long term (current) use of anticoagulants: Secondary | ICD-10-CM | POA: Diagnosis not present

## 2022-12-27 DIAGNOSIS — Z79899 Other long term (current) drug therapy: Secondary | ICD-10-CM | POA: Diagnosis not present

## 2022-12-27 DIAGNOSIS — E1100 Type 2 diabetes mellitus with hyperosmolarity without nonketotic hyperglycemic-hyperosmolar coma (NKHHC): Secondary | ICD-10-CM | POA: Diagnosis present

## 2022-12-27 DIAGNOSIS — R5381 Other malaise: Secondary | ICD-10-CM | POA: Diagnosis present

## 2022-12-27 DIAGNOSIS — Z7984 Long term (current) use of oral hypoglycemic drugs: Secondary | ICD-10-CM | POA: Diagnosis not present

## 2022-12-27 DIAGNOSIS — Z89511 Acquired absence of right leg below knee: Secondary | ICD-10-CM | POA: Diagnosis not present

## 2022-12-27 LAB — CBC
HCT: 26.2 % — ABNORMAL LOW (ref 39.0–52.0)
Hemoglobin: 9.6 g/dL — ABNORMAL LOW (ref 13.0–17.0)
MCH: 31.4 pg (ref 26.0–34.0)
MCHC: 36.6 g/dL — ABNORMAL HIGH (ref 30.0–36.0)
MCV: 85.6 fL (ref 80.0–100.0)
Platelets: 218 10*3/uL (ref 150–400)
RBC: 3.06 MIL/uL — ABNORMAL LOW (ref 4.22–5.81)
RDW: 13.2 % (ref 11.5–15.5)
WBC: 6.2 10*3/uL (ref 4.0–10.5)
nRBC: 0 % (ref 0.0–0.2)

## 2022-12-27 LAB — LIPASE, BLOOD: Lipase: 27 U/L (ref 11–51)

## 2022-12-27 LAB — ECHOCARDIOGRAM COMPLETE
Height: 71 in
S' Lateral: 2.3 cm
Weight: 3120 oz

## 2022-12-27 LAB — CBG MONITORING, ED
Glucose-Capillary: 289 mg/dL — ABNORMAL HIGH (ref 70–99)
Glucose-Capillary: 412 mg/dL — ABNORMAL HIGH (ref 70–99)
Glucose-Capillary: 419 mg/dL — ABNORMAL HIGH (ref 70–99)
Glucose-Capillary: 308 mg/dL — ABNORMAL HIGH (ref 70–99)
Glucose-Capillary: 271 mg/dL — ABNORMAL HIGH (ref 70–99)

## 2022-12-27 LAB — GLUCOSE, CAPILLARY
Glucose-Capillary: 221 mg/dL — ABNORMAL HIGH (ref 70–99)
Glucose-Capillary: 227 mg/dL — ABNORMAL HIGH (ref 70–99)
Glucose-Capillary: 253 mg/dL — ABNORMAL HIGH (ref 70–99)
Glucose-Capillary: 291 mg/dL — ABNORMAL HIGH (ref 70–99)
Glucose-Capillary: 300 mg/dL — ABNORMAL HIGH (ref 70–99)

## 2022-12-27 LAB — BASIC METABOLIC PANEL
Anion gap: 9 (ref 5–15)
BUN: 34 mg/dL — ABNORMAL HIGH (ref 8–23)
CO2: 24 mmol/L (ref 22–32)
Calcium: 7.6 mg/dL — ABNORMAL LOW (ref 8.9–10.3)
Chloride: 102 mmol/L (ref 98–111)
Creatinine, Ser: 2.21 mg/dL — ABNORMAL HIGH (ref 0.61–1.24)
GFR, Estimated: 30 mL/min — ABNORMAL LOW (ref >60)
Glucose, Bld: 217 mg/dL — ABNORMAL HIGH (ref 70–99)
Potassium: 3.3 mmol/L — ABNORMAL LOW (ref 3.5–5.1)
Sodium: 135 mmol/L (ref 135–145)

## 2022-12-27 LAB — HEMOGLOBIN A1C
Hgb A1c MFr Bld: 14 % — ABNORMAL HIGH (ref 4.8–5.6)
Mean Plasma Glucose: 355.1 mg/dL

## 2022-12-27 LAB — HEPARIN LEVEL (UNFRACTIONATED): Heparin Unfractionated: <0.1 [IU]/mL — ABNORMAL LOW (ref 0.30–0.70)

## 2022-12-27 MED ORDER — SODIUM CHLORIDE 0.9 % IV SOLN: INTRAVENOUS | Status: AC

## 2022-12-27 MED ORDER — INSULIN GLARGINE-YFGN 100 UNIT/ML ~~LOC~~ SOLN
25.0000 [IU] | Freq: Every day | SUBCUTANEOUS | Status: DC
  Administered 2022-12-27: 25 [IU] via SUBCUTANEOUS
  Filled 2022-12-27: qty 0.25

## 2022-12-27 MED ORDER — ACETAMINOPHEN 650 MG RE SUPP: 650.0000 mg | Freq: Four times a day (QID) | RECTAL | Status: DC | PRN

## 2022-12-27 MED ORDER — INSULIN ASPART 100 UNIT/ML IJ SOLN
0.0000 [IU] | Freq: Every day | INTRAMUSCULAR | Status: DC
  Administered 2022-12-27: 3 [IU] via SUBCUTANEOUS
  Administered 2022-12-27 – 2022-12-28 (×2): 2 [IU] via SUBCUTANEOUS
  Administered 2022-12-30: 3 [IU] via SUBCUTANEOUS

## 2022-12-27 MED ORDER — METOPROLOL TARTRATE 5 MG/5ML IV SOLN
5.0000 mg | INTRAVENOUS | Status: DC | PRN
  Administered 2022-12-27: 5 mg via INTRAVENOUS
  Filled 2022-12-27: qty 5

## 2022-12-27 MED ORDER — INSULIN ASPART 100 UNIT/ML IJ SOLN
0.0000 [IU] | Freq: Three times a day (TID) | INTRAMUSCULAR | Status: DC
  Administered 2022-12-27: 3 [IU] via SUBCUTANEOUS
  Administered 2022-12-27 – 2022-12-28 (×3): 5 [IU] via SUBCUTANEOUS
  Administered 2022-12-28 (×2): 2 [IU] via SUBCUTANEOUS
  Administered 2022-12-29: 1 [IU] via SUBCUTANEOUS
  Administered 2022-12-29: 2 [IU] via SUBCUTANEOUS
  Administered 2022-12-30: 3 [IU] via SUBCUTANEOUS
  Administered 2022-12-30: 2 [IU] via SUBCUTANEOUS

## 2022-12-27 MED ORDER — POTASSIUM CHLORIDE CRYS ER 20 MEQ PO TBCR
40.0000 meq | EXTENDED_RELEASE_TABLET | Freq: Once | ORAL | Status: AC
  Administered 2022-12-27: 40 meq via ORAL
  Filled 2022-12-27: qty 2

## 2022-12-27 MED ORDER — APIXABAN 5 MG PO TABS
5.0000 mg | ORAL_TABLET | Freq: Two times a day (BID) | ORAL | Status: DC
  Administered 2022-12-27 – 2022-12-31 (×9): 5 mg via ORAL
  Filled 2022-12-27 (×9): qty 1

## 2022-12-27 MED ORDER — METOPROLOL TARTRATE 25 MG PO TABS
25.0000 mg | ORAL_TABLET | Freq: Two times a day (BID) | ORAL | Status: DC
  Administered 2022-12-27 (×2): 25 mg via ORAL
  Filled 2022-12-27 (×3): qty 1

## 2022-12-27 MED ORDER — TAMSULOSIN HCL 0.4 MG PO CAPS
0.4000 mg | ORAL_CAPSULE | Freq: Every day | ORAL | Status: DC
  Administered 2022-12-27 – 2022-12-31 (×5): 0.4 mg via ORAL
  Filled 2022-12-27 (×5): qty 1

## 2022-12-27 MED ORDER — ACETAMINOPHEN 325 MG PO TABS: 650.0000 mg | ORAL_TABLET | Freq: Four times a day (QID) | ORAL | Status: DC | PRN

## 2022-12-27 MED ORDER — INSULIN GLARGINE-YFGN 100 UNIT/ML ~~LOC~~ SOLN
35.0000 [IU] | Freq: Every day | SUBCUTANEOUS | Status: DC
  Administered 2022-12-28: 35 [IU] via SUBCUTANEOUS
  Filled 2022-12-27 (×2): qty 0.35

## 2022-12-27 NOTE — Progress Notes (Signed)
Echocardiogram 2D Echocardiogram has been performed.  Warren Lacy Jacoby Zanni RDCS 12/27/2022, 10:45 AM

## 2022-12-27 NOTE — Progress Notes (Signed)
ANTICOAGULATION CONSULT NOTE - Follow Up Consult  Pharmacy Consult for heparin Indication:  Aflutter  Labs: Recent Labs    12/26/22 1157 12/26/22 1406 12/26/22 1558 12/26/22 1827 12/27/22 0130  HGB 10.0*  --   --   --  9.6*  HCT 29.3*  --   --   --  26.2*  PLT 201  --   --   --  218  HEPARINUNFRC  --   --   --   --  <0.10*  CREATININE 2.44*  --   --   --   --   CKTOTAL  --  122  --   --   --   TROPONINIHS  --   --  50* 39*  --     Assessment: 77yo male subtherapeutic on heparin with initial dosing for new Aflutter; no signs of bleeding per RN though she does note that pt lost IV for a period, re-established at ~0130.  Goal of Therapy:  Heparin level 0.3-0.7 units/ml   Plan:  Increase heparin infusion by 2 units/kg/hr to 1400 units/hr. Check level in 8 hours.   Vernard Gambles, PharmD, BCPS 12/27/2022 3:23 AM

## 2022-12-27 NOTE — Plan of Care (Signed)

## 2022-12-27 NOTE — TOC Benefit Eligibility Note (Signed)
Patient Product/process development scientist completed.    The patient is insured through [employer]. Patient has Medicare and is not eligible for a copay card, but may be able to apply for patient assistance, if available.    Ran test claim for Eliquis 5 mg and the current 30 day co-pay is $47.00.   This test claim was processed through [facility]- copay amounts may vary at other pharmacies due to pharmacy/plan contracts, or as the patient moves through the different stages of their insurance plan.     Roland Earl, CPHT Pharmacy Technician III Certified Patient Advocate [facility] Pharmacy Patient Advocate Team Direct Number: [phone]  Fax: [phone]

## 2022-12-27 NOTE — Inpatient Diabetes Management (Signed)
Inpatient Diabetes Program Recommendations  AACE/ADA: New Consensus Statement on Inpatient Glycemic Control (2015)  Target Ranges:  Prepandial:   less than 140 mg/dL      Peak postprandial:   less than 180 mg/dL (1-2 hours)      Critically ill patients:  140 - 180 mg/dL   Lab Results  Component Value Date   GLUCAP 291 (H) 12/27/2022   HGBA1C 14.0 (H) 12/27/2022    Review of Glycemic Control  Latest Reference Range & Units 12/26/22 20:26 12/26/22 21:27 12/26/22 22:33 12/26/22 23:35 12/27/22 01:28 12/27/22 08:14 12/27/22 12:07 12/27/22 15:51  Glucose-Capillary 70 - 99 mg/dL 621 (H) 308 (H) 657 (H) 165 (H) 221 (H) 227 (H) 253 (H) 291 (H)   Diabetes history: DM 2 Outpatient Diabetes medications: Latus 50 units Current orders for Inpatient glycemic control:  Semglee 35 units Daily Novolog 0-9 units tid + hs  A1c 14% on 8/20  Spoke with pt at bedside regarding his A1c of 14% this admission. Discussed glucose and A1c goals. Pt reports only being on Latus 50 units at home and states he takes it everyday. Pt was tired and was not completely clear in his thought process. I will come back in the morning and see if he is more appropriate for education and to make sure what he needs in preparation for discharge.  Thanks,  Christena Deem RN, MSN, BC-ADM Inpatient Diabetes Coordinator Team Pager [phone] (8a-5p)

## 2022-12-27 NOTE — Evaluation (Signed)
Occupational Therapy Evaluation Patient Details Name: Cory [name] MRN: [id number] DOB: May 27, 1945 Today's Date: 12/27/2022   History of Present Illness 77 yo male admitted 8/19 with decreased intake, dark urine, weakness and possible UTI. Pt with Aflutter in ED. PMhx: T2DM, HTN, HLD, Rt BKA July 2022, BPH, gout   Clinical Impression   PTA, pt lives alone in accessible apartment, reports Modified Independence with ADLs, IADLs and mobility (no AD in apartment and Rollator outside). Pt presents now with deficits in endurance and standing balance. Pt requires Min A for transfers without AD and noted unsteadiness. Pt requires Min A for LB ADLs. Recommend use of AD for mobility inside/outside of apartment, initial assist with IADLs and HH therapy follow up at DC. Anticipate quick improvements with improved PO intake and continued OOB activity. If pt slow to progress, may need to consider postacute rehab stay.       If plan is discharge home, recommend the following: A little help with bathing/dressing/bathroom;Assistance with cooking/housework;Assist for transportation;Help with stairs or ramp for entrance    Functional Status Assessment  Patient has had a recent decline in their functional status and demonstrates the ability to make significant improvements in function in a reasonable and predictable amount of time.  Equipment Recommendations  None recommended by OT    Recommendations for Other Services       Precautions / Restrictions Precautions Precautions: Fall;Other (comment) Precaution Comments: watch HR, R BKA w/ prosthetic Restrictions Weight Bearing Restrictions: No      Mobility Bed Mobility Overal bed mobility: Needs Assistance Bed Mobility: Supine to Sit, Sit to Supine     Supine to sit: Supervision, HOB elevated Sit to supine: Supervision        Transfers Overall transfer level: Needs assistance Equipment used: None Transfers: Sit to/from Stand Sit to Stand:  Min assist                  Balance Overall balance assessment: Needs assistance Sitting-balance support: No upper extremity supported, Feet supported Sitting balance-Leahy Scale: Good     Standing balance support: No upper extremity supported, During functional activity Standing balance-Leahy Scale: Poor Standing balance comment: reaching out to furniture for support                           ADL either performed or assessed with clinical judgement   ADL Overall ADL's : Needs assistance/impaired Eating/Feeding: Independent   Grooming: Contact guard assist;Standing   Upper Body Bathing: Sitting;Set up   Lower Body Bathing: Minimal assistance;Sit to/from stand   Upper Body Dressing : Set up;Sitting   Lower Body Dressing: Minimal assistance;Sitting/lateral leans;Sit to/from stand Lower Body Dressing Details (indicate cue type and reason): assist for B shoes; pt reports donning shoe to prosthetic LE prior to donning prosthetic - already had prosthetic LE on prior to OT entry Toilet Transfer: Minimal assistance;Stand-pivot Toilet Transfer Details (indicate cue type and reason): without AD; would likely improve with walker use Toileting- Clothing Manipulation and Hygiene: Minimal assistance;Sitting/lateral lean;Sit to/from stand         General ADL Comments: Noted unsteadiness without AD in standing - reaching for furniture, etc with HR a fib     Vision Ability to See in Adequate Light: 0 Adequate Patient Visual Report: No change from baseline Vision Assessment?: No apparent visual deficits     Perception         Praxis         Pertinent  Vitals/Pain Pain Assessment Pain Assessment: No/denies pain     Extremity/Trunk Assessment Upper Extremity Assessment Upper Extremity Assessment: Overall WFL for tasks assessed   Lower Extremity Assessment Lower Extremity Assessment: Defer to PT evaluation   Cervical / Trunk Assessment Cervical / Trunk  Assessment: Normal   Communication Communication Communication: No apparent difficulties   Cognition Arousal: Alert Behavior During Therapy: WFL for tasks assessed/performed, Flat affect Overall Cognitive Status: No family/caregiver present to determine baseline cognitive functioning                                 General Comments: pleasant, follows directions though does benefit from redirectional cues to tasks. some decreased insight into current deficits     General Comments  HR up to 145bpm briefly with activity. BP and O2 WFL    Exercises     Shoulder Instructions      Home Living Family/patient expects to be discharged to:: Private residence Living Arrangements: Alone Available Help at Discharge: Family Type of Home: Apartment Home Access: Level entry     Home Layout: One level     Bathroom Shower/Tub: Chief Strategy Officer: Standard (may have BSC over toilet)     Home Equipment: Rollator (4 wheels);Wheelchair - manual;Tub bench;Other (comment) (may have BSC over toilet)          Prior Functioning/Environment Prior Level of Function : Independent/Modified Independent;Driving             Mobility Comments: reports no AD inside, use of Rollator outside and use of seat for rest breaks ADLs Comments: reports MOD I for ADLs, dons shoe to prosthetic LE prior to donning prosthetic, uses tub bench for showers. reports able to manage meal prep laundry at home, drives and grocery shops though family sometimes assists with this        OT Problem List: Decreased strength;Decreased activity tolerance;Impaired balance (sitting and/or standing);Decreased safety awareness;Decreased knowledge of use of DME or AE      OT Treatment/Interventions: Self-care/ADL training;Therapeutic exercise;Energy conservation;DME and/or AE instruction;Therapeutic activities;Patient/family education    OT Goals(Current goals can be found in the care plan  section) Acute Rehab OT Goals Patient Stated Goal: home soon, do whatever i need to do to get better OT Goal Formulation: With patient Time For Goal Achievement: 01/09/23 Potential to Achieve Goals: Good ADL Goals Pt Will Perform Lower Body Bathing: with modified independence;sit to/from stand Pt Will Perform Lower Body Dressing: with modified independence;with adaptive equipment;sit to/from stand Pt Will Transfer to Toilet: with modified independence;ambulating Additional ADL Goal #1: Pt to verbalize at least 3 fall prevention strategies to implement at home  OT Frequency: Min 1X/week    Co-evaluation              AM-PAC OT "6 Clicks" Daily Activity     Outcome Measure Help from another person eating meals?: None Help from another person taking care of personal grooming?: A Little Help from another person toileting, which includes using toliet, bedpan, or urinal?: A Little Help from another person bathing (including washing, rinsing, drying)?: A Little Help from another person to put on and taking off regular upper body clothing?: A Little Help from another person to put on and taking off regular lower body clothing?: A Little 6 Click Score: 19   End of Session Nurse Communication: Mobility status  Activity Tolerance: Patient tolerated treatment well Patient left: in bed;with call bell/phone within  reach;with bed alarm set  OT Visit Diagnosis: Unsteadiness on feet (R26.81);Other abnormalities of gait and mobility (R26.89);Muscle weakness (generalized) (M62.81)                Time: 1308-6578 OT Time Calculation (min): 31 min Charges:  OT General Charges $OT Visit: 1 Visit OT Evaluation $OT Eval Low Complexity: 1 Low OT Treatments $Therapeutic Activity: 8-22 mins  Bradd Canary, OTR/L Acute Rehab Services Office: [phone]   Lorre Munroe 12/27/2022, 8:31 AM

## 2022-12-27 NOTE — Progress Notes (Addendum)
PROGRESS NOTE        PATIENT DETAILS Name: Cory [name] Age: 77 y.o. Sex: male Date of Birth: 06-21-1945 Admit Date: 12/26/2022 Admitting Physician John Giovanni, MD ZOX:WRUEAV, [name], MD  Brief Summary: Patient is a 77 y.o.  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  He was brought to the ED on 8/19-found to have acute metabolic encephalopathy, hyperglycemic nonketotic hyperglycemia, AKI, a flutter with RVR and subsequently admitted to the hospitalist service.  Significant events: 8/19>> admit to [facility]  Significant studies: 8/19>> CT head: No acute intracranial abnormality 8/19>> CXR: No active disease  Significant microbiology data: None  Procedures: None  Consults: None  Subjective: Lying comfortably in bed-denies any chest pain or shortness of breath.  Objective: Vitals: Blood pressure 118/76, pulse 87, temperature 98.1 F (36.7 C), temperature source Oral, resp. rate 16, height 5\' 11"  (1.803 m), weight 88.5 kg, SpO2 98%.   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 Neurology: Non focal Skin: no rash  Pertinent Labs/Radiology:    Latest Ref Rng & Units 12/27/2022    1:30 AM 12/26/2022   11:57 AM 12/07/2020    2:51 AM  CBC  WBC 4.0 - 10.5 K/uL 6.2  6.3    Hemoglobin 13.0 - 17.0 g/dL 9.6  40.9  9.8   Hematocrit 39.0 - 52.0 % 26.2  29.3  30.0   Platelets 150 - 400 K/uL 218  201      Lab Results  Component Value Date   NA 135 12/27/2022   K 3.3 (L) 12/27/2022   CL 102 12/27/2022   CO2 24 12/27/2022      Assessment/Plan: Acute metabolic encephalopathy Due to AKI/nonketotic  hyperglycemia Apparently was confused when he first presented-this has resolved-patient is completely awake and alert.  Hyperglycemic hyperosmolar nonketotic state Required insulin drip on initial presentation Has been transitioned to SQ insulin-see below.  AKI Suspect due to osmotic diuresis due to hyperglycemia/some hemodynamic mediated injury in the setting of HCTZ/losartan use Creatinine slowly downtrending Continue to avoid nephrotoxic agents Check frequent bladder scans If not improving-will initiate further workup.  New onset atrial flutter with RVR Rate controlled-on Cardizem infusion Start beta-blocker and attempt to titrate down Cardizem infusion Switch IV heparin to Eliquis (CHA2DS2-VASc score of at least 4) Echo pending TSH stable Telemetry monitoring  Hypokalemia Replete/recheck  ?  UTI versus asymptomatic bacteriuria No symptom Given 1 dose of of Rocephin in the ED-not resumed since then Monitor off antibiotics as he is asymptomatic.  Follow cultures.  DM-2 (A1c 14 on 8/20) with uncontrolled hyperglycemia/poor long-term control CBGs on the higher side Appetite slowly improving Allow some amount of permissive hyperglycemia But should be okay to increase Semglee to 35 units (on 45 units of Lantus daily at home) Follow CBGs  Recent Labs    12/26/22 2335 12/27/22 0128 12/27/22 0814  GLUCAP 165* 221* 227*    Normocytic anemia Chronic issue Mild drop in hemoglobin is likely due to IV fluid dilution No evidence of GI bleeding/blood loss Follow CBC  HTN Starting low-dose beta-blocker Losartan/HCTZ on hold due to AKI  HLD Statin  BPH Flomax  Debility/deconditioning PT/OT eval  BMI: Estimated body mass index is 27.2 kg/m as calculated from the following:   Height as of this encounter: 5\' 11"  (1.803 m).   Weight as of this encounter: 88.5 kg.   Code status:   Code Status: Full Code   DVT Prophylaxis: Eliquis  Family Communication: None at  bedside  Disposition Plan: Status is: Observation The patient will require care spanning > 2 midnights and should be moved to inpatient because: Severity of illness   Planned Discharge Destination:Home health   Diet: Diet Order             Diet heart healthy/carb modified Room service appropriate? Yes; Fluid consistency: Thin  Diet effective now                     Antimicrobial agents: Anti-infectives (From admission, onward)    Start     Dose/Rate Route Frequency Ordered Stop   12/26/22 1645  cefTRIAXone (ROCEPHIN) 1 g in sodium chloride 0.9 % 100 mL IVPB        1 g 200 mL/hr over 30 Minutes Intravenous  Once 12/26/22 1636 12/26/22 1923        MEDICATIONS: Scheduled Meds:  insulin aspart  0-5 Units Subcutaneous QHS   insulin aspart  0-9 Units Subcutaneous TID WC   insulin glargine-yfgn  25 Units Subcutaneous Daily   Continuous Infusions:  sodium chloride 150 mL/hr at 12/27/22 0833   diltiazem (CARDIZEM) infusion 5 mg/hr (12/27/22 0314)   heparin 1,400 Units/hr (12/27/22 0330)   PRN Meds:.acetaminophen **OR** acetaminophen, dextrose   I have personally reviewed following labs and imaging studies  LABORATORY DATA: CBC: Recent Labs  Lab 12/26/22 1157 12/27/22 0130  WBC 6.3 6.2  NEUTROABS 4.6  --   HGB 10.0* 9.6*  HCT 29.3* 26.2*  MCV 82.8 85.6  PLT 201 218    Basic Metabolic Panel: Recent Labs  Lab 12/26/22 1157 12/26/22 1558 12/27/22 0130  NA 127*  --  135  K 4.2  --  3.3*  CL 93*  --  102  CO2 24  --  24  GLUCOSE 580*  --  217*  BUN 49*  --  34*  CREATININE 2.44*  --  2.21*  CALCIUM 8.3*  --  7.6*  MG  --  2.3  --     GFR: Estimated Creatinine Clearance: 30.8 mL/min (A) (by C-G formula based on SCr of 2.21 mg/dL (H)).  Liver Function Tests: Recent Labs  Lab 12/26/22 1157  AST 12*  ALT 9  ALKPHOS 61  BILITOT 0.8  PROT 6.9  ALBUMIN 3.1*   Recent Labs  Lab 12/26/22 1406 12/27/22 0130  LIPASE 58* 27   Recent Labs  Lab  12/26/22 1406  AMMONIA 30    Coagulation Profile: No results for input(s): "INR", "PROTIME" in the last 168 hours.  Cardiac Enzymes: Recent Labs  Lab 12/26/22 1406  CKTOTAL 122    BNP (last 3 results) No results for input(s): "PROBNP" in the last 8760 hours.  Lipid Profile: No results for input(s): "CHOL", "HDL", "LDLCALC", "TRIG", "CHOLHDL", "LDLDIRECT" in the last 72 hours.  Thyroid Function Tests: Recent Labs    12/26/22 1406  TSH 0.995    Anemia Panel: No results for input(s): "VITAMINB12", "FOLATE", "FERRITIN", "TIBC", "IRON", "RETICCTPCT" in the last 72 hours.  Urine analysis:    Component Value Date/Time   COLORURINE YELLOW 12/26/2022 1530   APPEARANCEUR HAZY (A) 12/26/2022 1530   LABSPEC  1.016 12/26/2022 1530   PHURINE 5.5 12/26/2022 1530   GLUCOSEU >1,000 (A) 12/26/2022 1530   HGBUR MODERATE (A) 12/26/2022 1530   BILIRUBINUR NEGATIVE 12/26/2022 1530   KETONESUR NEGATIVE 12/26/2022 1530   PROTEINUR 30 (A) 12/26/2022 1530   NITRITE NEGATIVE 12/26/2022 1530   LEUKOCYTESUR MODERATE (A) 12/26/2022 1530    Sepsis Labs: Lactic Acid, Venous    Component Value Date/Time   LATICACIDVEN 1.5 12/26/2022 1558    MICROBIOLOGY: No results found for this or any previous visit (from the past 240 hour(s)).  RADIOLOGY STUDIES/RESULTS: DG Chest Portable 1 View  Result Date: 12/26/2022 CLINICAL DATA:  Shortness of breath EXAM: PORTABLE CHEST 1 VIEW COMPARISON:  Chest x-ray 11/26/2020 FINDINGS: The heart size and mediastinal contours are within normal limits. Both lungs are clear. The visualized skeletal structures are unremarkable. IMPRESSION: No active disease. Electronically Signed   By: Darliss Cheney M.D.   On: 12/26/2022 15:53   CT Head Wo Contrast  Result Date: 12/26/2022 CLINICAL DATA:  Mental status change, unknown cause EXAM: CT HEAD WITHOUT CONTRAST TECHNIQUE: Contiguous axial images were obtained from the base of the skull through the vertex without  intravenous contrast. RADIATION DOSE REDUCTION: This exam was performed according to the departmental dose-optimization program which includes automated exposure control, adjustment of the mA and/or kV according to patient size and/or use of iterative reconstruction technique. COMPARISON:  CT Head 11/07/22 FINDINGS: Brain: No evidence of acute infarction, hemorrhage, hydrocephalus, extra-axial collection or mass lesion/mass effect. Sequela of moderate chronic microvascular ischemic change. Vascular: No hyperdense vessel or unexpected calcification. Skull: Normal. Negative for fracture or focal lesion. Sinuses/Orbits: No middle ear or mastoid effusion. Paranasal sinuses are clear. Bilateral lens replacement. Orbits are otherwise unremarkable. Other: None. IMPRESSION: No acute intracranial abnormality. Electronically Signed   By: Lorenza Cambridge M.D.   On: 12/26/2022 14:02     LOS: 0 days   Jeoffrey Massed, MD  [facility]    To contact the attending provider between 7A-7P or the covering provider during after hours 7P-7A, please log into the web site [email or web address] and access using universal [place] password for that web site. If you do not have the password, please call the hospital operator.  12/27/2022, 8:46 AM

## 2022-12-27 NOTE — Progress Notes (Signed)
ANTICOAGULATION CONSULT NOTE - Follow Up Consult  Pharmacy Consult for Heparin >> Eliquis  Indication:  atrial flutter  No Known Allergies  Patient Measurements: Height: 5\' 11"  (180.3 cm) Weight: 88.5 kg (195 lb) IBW/kg (Calculated) : 75.3 Heparin Dosing Weight: 88.5 kg  Vital Signs: Temp: 98.1 F (36.7 C) (08/20 0802) Temp Source: Oral (08/20 0802) BP: 118/76 (08/20 0802) Pulse Rate: 87 (08/20 0802)  Labs: Recent Labs    12/26/22 1157 12/26/22 1406 12/26/22 1558 12/26/22 1827 12/27/22 0130  HGB 10.0*  --   --   --  9.6*  HCT 29.3*  --   --   --  26.2*  PLT 201  --   --   --  218  HEPARINUNFRC  --   --   --   --  <0.10*  CREATININE 2.44*  --   --   --  2.21*  CKTOTAL  --  122  --   --   --   TROPONINIHS  --   --  50* 39*  --     Estimated Creatinine Clearance: 30.8 mL/min (A) (by C-G formula based on SCr of 2.21 mg/dL (H)).   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 Patient is presenting with hyperglycemia, dehydration, AKI. Noted to be in atrial flutter with RVR in the ED. Heparin per pharmacy consult placed for  atrial flutter .  Patient is not on anticoagulation prior to arrival.  Hgb 10; plt 218, HL <0.10  Goal of Therapy:  Monitor CBC by anticoagulation protocol: Yes   Plan:  Stop Heparin infusion  Start Apixaban 5 mg BID for nonvalvular afib  Pharmacy will monitor peripherally   Gwynn Burly, PharmD 12/27/2022 9:56 AM

## 2022-12-27 NOTE — Evaluation (Signed)
Physical Therapy Evaluation Patient Details Name: Kalique Dufek MRN: [id number] DOB: 23-Dec-1945 Today's Date: 12/27/2022  History of Present Illness  77 yo male admitted 8/19 with decreased intake, dark urine, weakness and possible UTI. Pt with Aflutter in ED. PMhx: T2DM, HTN, HLD, Rt BKA July 2022, BPH, gout  Clinical Impression  Pt pleasant and had donned prosthetic prior to arrival. Pt able to walk with rollator with cues for direction and safety and to control/move IV pole. Pt lives alone and reports only 1 fall in the last year with pt normally using walkign stick. Pt with decreased activity tolerance and balance who will benefit from acute therapy to maximize independence prior to return home. Encouraged daily ambulation with staff assist for lines.         If plan is discharge home, recommend the following: Assistance with cooking/housework;Assist for transportation   Can travel by private vehicle        Equipment Recommendations None recommended by PT  Recommendations for Other Services       Functional Status Assessment Patient has had a recent decline in their functional status and demonstrates the ability to make significant improvements in function in a reasonable and predictable amount of time.     Precautions / Restrictions Precautions Precautions: Fall;Other (comment) Precaution Comments: Rt BKA w/ prosthetic      Mobility  Bed Mobility               General bed mobility comments: pt sitting on arrival and end of session    Transfers Overall transfer level: Needs assistance   Transfers: Sit to/from Stand Sit to Stand: Supervision           General transfer comment: supervision for lines as pt without awareness of IV placement with turning and transfers    Ambulation/Gait Ambulation/Gait assistance: Supervision Gait Distance (Feet): 225 Feet Assistive device: Rollator (4 wheels) Gait Pattern/deviations: Step-through pattern, Decreased stride  length   Gait velocity interpretation: 1.31 - 2.62 ft/sec, indicative of limited community ambulator   General Gait Details: supervision for direction and IV pole management  Stairs            Wheelchair Mobility     Tilt Bed    Modified Rankin (Stroke Patients Only)       Balance Overall balance assessment: Needs assistance   Sitting balance-Leahy Scale: Good     Standing balance support: Bilateral upper extremity supported Standing balance-Leahy Scale: Poor Standing balance comment: rollator for gait                             Pertinent Vitals/Pain Pain Assessment Pain Assessment: No/denies pain    Home Living Family/patient expects to be discharged to:: Private residence Living Arrangements: Alone Available Help at Discharge: Family;Available PRN/intermittently Type of Home: Apartment Home Access: Level entry       Home Layout: One level Home Equipment: Rollator (4 wheels);Wheelchair - manual;Tub bench;Cane - single Librarian, academic (2 wheels)      Prior Function Prior Level of Function : Independent/Modified Independent;Driving             Mobility Comments: states he uses a walking stick inside and rollator outside ADLs Comments: reports MOD I for ADLs, dons shoe to prosthetic LE prior to donning prosthetic, uses tub bench for showers. reports able to manage meal prep laundry at home, drives and grocery shops though family sometimes assists with this     Extremity/Trunk Assessment  Upper Extremity Assessment Upper Extremity Assessment: Overall WFL for tasks assessed    Lower Extremity Assessment Lower Extremity Assessment: Overall WFL for tasks assessed    Cervical / Trunk Assessment Cervical / Trunk Assessment: Normal  Communication   Communication Communication: No apparent difficulties;Other (comment) (heavy accent)  Cognition Arousal: Alert Behavior During Therapy: WFL for tasks assessed/performed Overall  Cognitive Status: Within Functional Limits for tasks assessed                                          General Comments      Exercises     Assessment/Plan    PT Assessment Patient needs continued PT services  PT Problem List Decreased activity tolerance;Decreased balance;Decreased knowledge of use of DME       PT Treatment Interventions DME instruction;Gait training;Functional mobility training;Therapeutic activities;Patient/family education    PT Goals (Current goals can be found in the Care Plan section)  Acute Rehab PT Goals Patient Stated Goal: return home PT Goal Formulation: With patient Time For Goal Achievement: 01/03/23 Potential to Achieve Goals: Good    Frequency Min 1X/week     Co-evaluation               AM-PAC PT "6 Clicks" Mobility  Outcome Measure Help needed turning from your back to your side while in a flat bed without using bedrails?: None Help needed moving from lying on your back to sitting on the side of a flat bed without using bedrails?: None Help needed moving to and from a bed to a chair (including a wheelchair)?: A Little Help needed standing up from a chair using your arms (e.g., wheelchair or bedside chair)?: A Little Help needed to walk in hospital room?: A Little Help needed climbing 3-5 steps with a railing? : A Little 6 Click Score: 20    End of Session   Activity Tolerance: Patient tolerated treatment well Patient left: in chair;with call bell/phone within reach Nurse Communication: Mobility status PT Visit Diagnosis: Other abnormalities of gait and mobility (R26.89)    Time: 4742-5956 PT Time Calculation (min) (ACUTE ONLY): 20 min   Charges:   PT Evaluation $PT Eval Low Complexity: 1 Low   PT General Charges $$ ACUTE PT VISIT: 1 Visit         Merryl Hacker, PT Acute Rehabilitation Services Office: [phone]   Enedina Finner Caedmon Louque 12/27/2022, 12:03 PM

## 2022-12-27 NOTE — Progress Notes (Signed)
Patient in Aflutter with heart rate going in the 130's and down to 90's and back and forth.  Advised Dr. Windell Norfolk and he advised give 5mg  IV Metoprolol.

## 2022-12-28 DIAGNOSIS — I4892 Unspecified atrial flutter: Secondary | ICD-10-CM | POA: Diagnosis not present

## 2022-12-28 DIAGNOSIS — R748 Abnormal levels of other serum enzymes: Secondary | ICD-10-CM

## 2022-12-28 DIAGNOSIS — G9341 Metabolic encephalopathy: Secondary | ICD-10-CM | POA: Diagnosis not present

## 2022-12-28 DIAGNOSIS — N179 Acute kidney failure, unspecified: Secondary | ICD-10-CM | POA: Diagnosis not present

## 2022-12-28 LAB — BASIC METABOLIC PANEL
Anion gap: 10 (ref 5–15)
BUN: 30 mg/dL — ABNORMAL HIGH (ref 8–23)
CO2: 21 mmol/L — ABNORMAL LOW (ref 22–32)
Calcium: 7.8 mg/dL — ABNORMAL LOW (ref 8.9–10.3)
Chloride: 104 mmol/L (ref 98–111)
Creatinine, Ser: 2.15 mg/dL — ABNORMAL HIGH (ref 0.61–1.24)
GFR, Estimated: 31 mL/min — ABNORMAL LOW (ref >60)
Glucose, Bld: 236 mg/dL — ABNORMAL HIGH (ref 70–99)
Potassium: 3.8 mmol/L (ref 3.5–5.1)
Sodium: 135 mmol/L (ref 135–145)

## 2022-12-28 LAB — GLUCOSE, CAPILLARY
Glucose-Capillary: 158 mg/dL — ABNORMAL HIGH (ref 70–99)
Glucose-Capillary: 285 mg/dL — ABNORMAL HIGH (ref 70–99)
Glucose-Capillary: 196 mg/dL — ABNORMAL HIGH (ref 70–99)
Glucose-Capillary: 239 mg/dL — ABNORMAL HIGH (ref 70–99)

## 2022-12-28 LAB — CBC
HCT: 28.7 % — ABNORMAL LOW (ref 39.0–52.0)
Hemoglobin: 9.6 g/dL — ABNORMAL LOW (ref 13.0–17.0)
MCH: 28.2 pg (ref 26.0–34.0)
MCHC: 33.4 g/dL (ref 30.0–36.0)
MCV: 84.2 fL (ref 80.0–100.0)
Platelets: 242 10*3/uL (ref 150–400)
RBC: 3.41 MIL/uL — ABNORMAL LOW (ref 4.22–5.81)
RDW: 13.4 % (ref 11.5–15.5)
WBC: 6.6 10*3/uL (ref 4.0–10.5)
nRBC: 0 % (ref 0.0–0.2)

## 2022-12-28 LAB — MAGNESIUM: Magnesium: 2.2 mg/dL (ref 1.7–2.4)

## 2022-12-28 LAB — PHOSPHORUS: Phosphorus: 2.6 mg/dL (ref 2.5–4.6)

## 2022-12-28 MED ORDER — SODIUM CHLORIDE 0.9 % IV SOLN: INTRAVENOUS | Status: AC

## 2022-12-28 MED ORDER — INSULIN ASPART 100 UNIT/ML IJ SOLN
4.0000 [IU] | Freq: Three times a day (TID) | INTRAMUSCULAR | Status: DC
  Administered 2022-12-28 – 2022-12-30 (×7): 4 [IU] via SUBCUTANEOUS

## 2022-12-28 MED ORDER — METOPROLOL TARTRATE 50 MG PO TABS
50.0000 mg | ORAL_TABLET | Freq: Two times a day (BID) | ORAL | Status: DC
  Administered 2022-12-28 – 2022-12-31 (×7): 50 mg via ORAL
  Filled 2022-12-28 (×7): qty 1

## 2022-12-28 NOTE — Discharge Instructions (Signed)

## 2022-12-28 NOTE — Plan of Care (Signed)
  Problem: Education: Goal: Knowledge of General Education information will improve Description: Including pain rating scale, medication(s)/side effects and non-pharmacologic comfort measures Outcome: Progressing   Problem: Activity: Goal: Risk for activity intolerance will decrease Outcome: Progressing   Problem: Nutrition: Goal: Adequate nutrition will be maintained Outcome: Progressing   Problem: Safety: Goal: Ability to remain free from injury will improve Outcome: Progressing   Problem: Skin Integrity: Goal: Risk for impaired skin integrity will decrease Outcome: Progressing   Problem: Education: Goal: Ability to describe self-care measures that may prevent or decrease complications (Diabetes Survival Skills Education) will improve Outcome: Progressing Goal: Individualized Educational Video(s) Outcome: Progressing

## 2022-12-28 NOTE — Plan of Care (Signed)

## 2022-12-28 NOTE — Progress Notes (Signed)
PROGRESS NOTE        PATIENT DETAILS Name: Cory [name] Age: 77 y.o. Sex: male Date of Birth: 07/24/1945 Admit Date: 12/26/2022 Admitting Physician Synetta Fail, MD BJY:NWGNFA, [name], MD  Brief Summary: Patient is a 77 y.o.  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  He was brought to the ED on 8/19-found to have acute metabolic encephalopathy, hyperglycemic nonketotic hyperglycemia, AKI, a flutter with RVR and subsequently admitted to the hospitalist service.  Significant events: 8/19>> admit to [facility]  Significant studies: 8/19>> CT head: No acute intracranial abnormality 8/19>> CXR: No active disease  Significant microbiology data: None  Procedures: None  Consults: None  Subjective: No complaints-remains in a flutter  Objective: Vitals: Blood pressure 125/66, pulse 76, temperature 98.1 F (36.7 C), temperature source Oral, resp. rate 18, height 5\' 11"  (1.803 m), weight 88.5 kg, SpO2 97%.   Exam: Gen Exam:Alert awake-not in any distress HEENT:atraumatic, normocephalic Chest: B/L clear to auscultation anteriorly CVS:S1S2 irregular Abdomen:soft non tender, non distended Extremities:no edema-right leg prosthesis Neurology: Non focal Skin: no rash  Pertinent Labs/Radiology:    Latest Ref Rng & Units 12/28/2022   12:35 AM 12/27/2022    1:30 AM 12/26/2022   11:57 AM  CBC  WBC 4.0 - 10.5 K/uL 6.6  6.2  6.3   Hemoglobin 13.0 - 17.0 g/dL 9.6  9.6  21.3   Hematocrit 39.0 - 52.0 % 28.7  26.2  29.3   Platelets 150 - 400 K/uL 242  218  201     Lab Results  Component Value Date   NA 135 12/28/2022   K 3.8 12/28/2022   CL 104 12/28/2022   CO2 21 (L) 12/28/2022      Assessment/Plan: Acute metabolic encephalopathy Due to AKI/nonketotic hyperglycemia Apparently was confused when he  first presented-this has resolved-patient is completely awake and alert.  Hyperglycemic hyperosmolar nonketotic state Required insulin drip on initial presentation Has been transitioned to SQ insulin-see below.  AKI Suspect due to osmotic diuresis due to hyperglycemia/some hemodynamic mediated injury in the setting of HCTZ/losartan use Creatinine slowly downtrending Continue to avoid nephrotoxic agents Frequent bladder scans Gentle IVF today-since improving-holding off on further workup.  New onset atrial flutter with RVR Rate controlled-but has had intermittent issues with RVR since yesterday evening No longer on Cardizem infusion Metoprolol increased to 50 mg twice daily Continue Eliquis  (CHA2DS2-VASc score of at least 4) Echo with stable EF TSH stable Telemetry monitoring Cardiology consult  Hypokalemia Repleted.  ?  UTI versus asymptomatic bacteriuria No symptom Looks like urine culture was never done No longer on antibiotics-was given 1 dose of Rocephin in the ED-and none since then Since likely asymptomatic bacteriuria-reasonable to continue to monitor off antibiotics for now.  DM-2 (A1c 14 on 8/20) with uncontrolled hyperglycemia/poor long-term control CBGs on the higher side Will get first dose of 35 units of Semglee today Had 4 units of scheduled Premeal NovoLog Follow CBGs and adjust accordingly.  Recent Labs    12/27/22 1551 12/27/22 2131 12/28/22 0753  GLUCAP 291* 300* 158*    Normocytic anemia Chronic issue Mild drop in hemoglobin is likely due to IV fluid dilution No evidence of GI bleeding/blood loss Follow CBC  HTN  PROGRESS NOTE        PATIENT DETAILS Name: Cory [name] Age: 77 y.o. Sex: male Date of Birth: 07/24/1945 Admit Date: 12/26/2022 Admitting Physician Synetta Fail, MD BJY:NWGNFA, [name], MD  Brief Summary: Patient is a 77 y.o.  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  He was brought to the ED on 8/19-found to have acute metabolic encephalopathy, hyperglycemic nonketotic hyperglycemia, AKI, a flutter with RVR and subsequently admitted to the hospitalist service.  Significant events: 8/19>> admit to [facility]  Significant studies: 8/19>> CT head: No acute intracranial abnormality 8/19>> CXR: No active disease  Significant microbiology data: None  Procedures: None  Consults: None  Subjective: No complaints-remains in a flutter  Objective: Vitals: Blood pressure 125/66, pulse 76, temperature 98.1 F (36.7 C), temperature source Oral, resp. rate 18, height 5\' 11"  (1.803 m), weight 88.5 kg, SpO2 97%.   Exam: Gen Exam:Alert awake-not in any distress HEENT:atraumatic, normocephalic Chest: B/L clear to auscultation anteriorly CVS:S1S2 irregular Abdomen:soft non tender, non distended Extremities:no edema-right leg prosthesis Neurology: Non focal Skin: no rash  Pertinent Labs/Radiology:    Latest Ref Rng & Units 12/28/2022   12:35 AM 12/27/2022    1:30 AM 12/26/2022   11:57 AM  CBC  WBC 4.0 - 10.5 K/uL 6.6  6.2  6.3   Hemoglobin 13.0 - 17.0 g/dL 9.6  9.6  21.3   Hematocrit 39.0 - 52.0 % 28.7  26.2  29.3   Platelets 150 - 400 K/uL 242  218  201     Lab Results  Component Value Date   NA 135 12/28/2022   K 3.8 12/28/2022   CL 104 12/28/2022   CO2 21 (L) 12/28/2022      Assessment/Plan: Acute metabolic encephalopathy Due to AKI/nonketotic hyperglycemia Apparently was confused when he  first presented-this has resolved-patient is completely awake and alert.  Hyperglycemic hyperosmolar nonketotic state Required insulin drip on initial presentation Has been transitioned to SQ insulin-see below.  AKI Suspect due to osmotic diuresis due to hyperglycemia/some hemodynamic mediated injury in the setting of HCTZ/losartan use Creatinine slowly downtrending Continue to avoid nephrotoxic agents Frequent bladder scans Gentle IVF today-since improving-holding off on further workup.  New onset atrial flutter with RVR Rate controlled-but has had intermittent issues with RVR since yesterday evening No longer on Cardizem infusion Metoprolol increased to 50 mg twice daily Continue Eliquis  (CHA2DS2-VASc score of at least 4) Echo with stable EF TSH stable Telemetry monitoring Cardiology consult  Hypokalemia Repleted.  ?  UTI versus asymptomatic bacteriuria No symptom Looks like urine culture was never done No longer on antibiotics-was given 1 dose of Rocephin in the ED-and none since then Since likely asymptomatic bacteriuria-reasonable to continue to monitor off antibiotics for now.  DM-2 (A1c 14 on 8/20) with uncontrolled hyperglycemia/poor long-term control CBGs on the higher side Will get first dose of 35 units of Semglee today Had 4 units of scheduled Premeal NovoLog Follow CBGs and adjust accordingly.  Recent Labs    12/27/22 1551 12/27/22 2131 12/28/22 0753  GLUCAP 291* 300* 158*    Normocytic anemia Chronic issue Mild drop in hemoglobin is likely due to IV fluid dilution No evidence of GI bleeding/blood loss Follow CBC  HTN  Recent Labs    12/26/22 1406  TSH 0.995    Anemia Panel: No results for input(s): "VITAMINB12", "FOLATE", "FERRITIN", "TIBC", "IRON", "RETICCTPCT" in the last 72 hours.  Urine analysis:    Component Value Date/Time   COLORURINE YELLOW 12/26/2022 1530   APPEARANCEUR HAZY (A) 12/26/2022 1530   LABSPEC 1.016 12/26/2022 1530   PHURINE 5.5 12/26/2022 1530   GLUCOSEU >1,000 (A) 12/26/2022 1530   HGBUR MODERATE (A) 12/26/2022 1530   BILIRUBINUR NEGATIVE 12/26/2022 1530   KETONESUR NEGATIVE 12/26/2022 1530   PROTEINUR 30 (A) 12/26/2022 1530   NITRITE NEGATIVE 12/26/2022 1530   LEUKOCYTESUR MODERATE (A) 12/26/2022 1530    Sepsis Labs: Lactic Acid, Venous    Component Value Date/Time   LATICACIDVEN 1.5 12/26/2022 1558    MICROBIOLOGY: No results found for this or any previous visit (from the past 240 hour(s)).  RADIOLOGY STUDIES/RESULTS: DG Abd 2 Views  Result Date: 12/27/2022 CLINICAL DATA:  Distended abdomen EXAM: ABDOMEN - 2 VIEW COMPARISON:  None Available. FINDINGS: Nonobstructive bowel gas pattern. No organomegaly, free air or suspicious calcification. No acute bony abnormality. IMPRESSION: No acute findings. Electronically Signed   By: Charlett Nose M.D.   On: 12/27/2022 23:31   ECHOCARDIOGRAM COMPLETE  Result Date: 12/27/2022    ECHOCARDIOGRAM REPORT   Patient  Name:   SUNDANCE GAMA Date of Exam: 12/27/2022 Medical Rec #:  [id number]    Height:       71.0 in Accession #:    [id number]   Weight:       195.0 lb Date of Birth:  08/28/1945    BSA:          2.086 m Patient Age:    76 years     BP:           118/76 mmHg Patient Gender: M            HR:           77 bpm. Exam Location:  Inpatient Procedure: 2D Echo, Color Doppler and Cardiac Doppler Indications:    I48.92* Unspecified atrial flutter  History:        Patient has no prior history of Echocardiogram examinations.                 Risk Factors:Hypertension, Diabetes and Dyslipidemia.  Sonographer:    Irving Burton Senior RDCS Referring Phys: [id number] VASUNDHRA RATHORE IMPRESSIONS  1. Left ventricular ejection fraction, by estimation, is 60 to 65%. The left ventricle has normal function. The left ventricle has no regional wall motion abnormalities. There is moderate concentric left ventricular hypertrophy. Left ventricular diastolic function could not be evaluated.  2. Right ventricular systolic function is normal. The right ventricular size is normal. There is normal pulmonary artery systolic pressure. The estimated right ventricular systolic pressure is 28.8 mmHg.  3. The mitral valve is normal in structure. Trivial mitral valve regurgitation. No evidence of mitral stenosis.  4. The aortic valve is tricuspid. Aortic valve regurgitation is not visualized. Aortic valve sclerosis is present, with no evidence of aortic valve stenosis.  5. The inferior vena cava is normal in size with greater than 50% respiratory variability, suggesting right atrial pressure of 3 mmHg. FINDINGS  Left Ventricle: Left ventricular ejection fraction, by estimation, is 60 to 65%. The left ventricle has normal function. The left ventricle has no regional wall motion abnormalities. The left ventricular internal cavity size was normal in size. There is  moderate concentric left ventricular hypertrophy. Left ventricular diastolic function could  Recent Labs    12/26/22 1406  TSH 0.995    Anemia Panel: No results for input(s): "VITAMINB12", "FOLATE", "FERRITIN", "TIBC", "IRON", "RETICCTPCT" in the last 72 hours.  Urine analysis:    Component Value Date/Time   COLORURINE YELLOW 12/26/2022 1530   APPEARANCEUR HAZY (A) 12/26/2022 1530   LABSPEC 1.016 12/26/2022 1530   PHURINE 5.5 12/26/2022 1530   GLUCOSEU >1,000 (A) 12/26/2022 1530   HGBUR MODERATE (A) 12/26/2022 1530   BILIRUBINUR NEGATIVE 12/26/2022 1530   KETONESUR NEGATIVE 12/26/2022 1530   PROTEINUR 30 (A) 12/26/2022 1530   NITRITE NEGATIVE 12/26/2022 1530   LEUKOCYTESUR MODERATE (A) 12/26/2022 1530    Sepsis Labs: Lactic Acid, Venous    Component Value Date/Time   LATICACIDVEN 1.5 12/26/2022 1558    MICROBIOLOGY: No results found for this or any previous visit (from the past 240 hour(s)).  RADIOLOGY STUDIES/RESULTS: DG Abd 2 Views  Result Date: 12/27/2022 CLINICAL DATA:  Distended abdomen EXAM: ABDOMEN - 2 VIEW COMPARISON:  None Available. FINDINGS: Nonobstructive bowel gas pattern. No organomegaly, free air or suspicious calcification. No acute bony abnormality. IMPRESSION: No acute findings. Electronically Signed   By: Charlett Nose M.D.   On: 12/27/2022 23:31   ECHOCARDIOGRAM COMPLETE  Result Date: 12/27/2022    ECHOCARDIOGRAM REPORT   Patient  Name:   SUNDANCE GAMA Date of Exam: 12/27/2022 Medical Rec #:  [id number]    Height:       71.0 in Accession #:    [id number]   Weight:       195.0 lb Date of Birth:  08/28/1945    BSA:          2.086 m Patient Age:    76 years     BP:           118/76 mmHg Patient Gender: M            HR:           77 bpm. Exam Location:  Inpatient Procedure: 2D Echo, Color Doppler and Cardiac Doppler Indications:    I48.92* Unspecified atrial flutter  History:        Patient has no prior history of Echocardiogram examinations.                 Risk Factors:Hypertension, Diabetes and Dyslipidemia.  Sonographer:    Irving Burton Senior RDCS Referring Phys: [id number] VASUNDHRA RATHORE IMPRESSIONS  1. Left ventricular ejection fraction, by estimation, is 60 to 65%. The left ventricle has normal function. The left ventricle has no regional wall motion abnormalities. There is moderate concentric left ventricular hypertrophy. Left ventricular diastolic function could not be evaluated.  2. Right ventricular systolic function is normal. The right ventricular size is normal. There is normal pulmonary artery systolic pressure. The estimated right ventricular systolic pressure is 28.8 mmHg.  3. The mitral valve is normal in structure. Trivial mitral valve regurgitation. No evidence of mitral stenosis.  4. The aortic valve is tricuspid. Aortic valve regurgitation is not visualized. Aortic valve sclerosis is present, with no evidence of aortic valve stenosis.  5. The inferior vena cava is normal in size with greater than 50% respiratory variability, suggesting right atrial pressure of 3 mmHg. FINDINGS  Left Ventricle: Left ventricular ejection fraction, by estimation, is 60 to 65%. The left ventricle has normal function. The left ventricle has no regional wall motion abnormalities. The left ventricular internal cavity size was normal in size. There is  moderate concentric left ventricular hypertrophy. Left ventricular diastolic function could  Recent Labs    12/26/22 1406  TSH 0.995    Anemia Panel: No results for input(s): "VITAMINB12", "FOLATE", "FERRITIN", "TIBC", "IRON", "RETICCTPCT" in the last 72 hours.  Urine analysis:    Component Value Date/Time   COLORURINE YELLOW 12/26/2022 1530   APPEARANCEUR HAZY (A) 12/26/2022 1530   LABSPEC 1.016 12/26/2022 1530   PHURINE 5.5 12/26/2022 1530   GLUCOSEU >1,000 (A) 12/26/2022 1530   HGBUR MODERATE (A) 12/26/2022 1530   BILIRUBINUR NEGATIVE 12/26/2022 1530   KETONESUR NEGATIVE 12/26/2022 1530   PROTEINUR 30 (A) 12/26/2022 1530   NITRITE NEGATIVE 12/26/2022 1530   LEUKOCYTESUR MODERATE (A) 12/26/2022 1530    Sepsis Labs: Lactic Acid, Venous    Component Value Date/Time   LATICACIDVEN 1.5 12/26/2022 1558    MICROBIOLOGY: No results found for this or any previous visit (from the past 240 hour(s)).  RADIOLOGY STUDIES/RESULTS: DG Abd 2 Views  Result Date: 12/27/2022 CLINICAL DATA:  Distended abdomen EXAM: ABDOMEN - 2 VIEW COMPARISON:  None Available. FINDINGS: Nonobstructive bowel gas pattern. No organomegaly, free air or suspicious calcification. No acute bony abnormality. IMPRESSION: No acute findings. Electronically Signed   By: Charlett Nose M.D.   On: 12/27/2022 23:31   ECHOCARDIOGRAM COMPLETE  Result Date: 12/27/2022    ECHOCARDIOGRAM REPORT   Patient  Name:   SUNDANCE GAMA Date of Exam: 12/27/2022 Medical Rec #:  [id number]    Height:       71.0 in Accession #:    [id number]   Weight:       195.0 lb Date of Birth:  08/28/1945    BSA:          2.086 m Patient Age:    76 years     BP:           118/76 mmHg Patient Gender: M            HR:           77 bpm. Exam Location:  Inpatient Procedure: 2D Echo, Color Doppler and Cardiac Doppler Indications:    I48.92* Unspecified atrial flutter  History:        Patient has no prior history of Echocardiogram examinations.                 Risk Factors:Hypertension, Diabetes and Dyslipidemia.  Sonographer:    Irving Burton Senior RDCS Referring Phys: [id number] VASUNDHRA RATHORE IMPRESSIONS  1. Left ventricular ejection fraction, by estimation, is 60 to 65%. The left ventricle has normal function. The left ventricle has no regional wall motion abnormalities. There is moderate concentric left ventricular hypertrophy. Left ventricular diastolic function could not be evaluated.  2. Right ventricular systolic function is normal. The right ventricular size is normal. There is normal pulmonary artery systolic pressure. The estimated right ventricular systolic pressure is 28.8 mmHg.  3. The mitral valve is normal in structure. Trivial mitral valve regurgitation. No evidence of mitral stenosis.  4. The aortic valve is tricuspid. Aortic valve regurgitation is not visualized. Aortic valve sclerosis is present, with no evidence of aortic valve stenosis.  5. The inferior vena cava is normal in size with greater than 50% respiratory variability, suggesting right atrial pressure of 3 mmHg. FINDINGS  Left Ventricle: Left ventricular ejection fraction, by estimation, is 60 to 65%. The left ventricle has normal function. The left ventricle has no regional wall motion abnormalities. The left ventricular internal cavity size was normal in size. There is  moderate concentric left ventricular hypertrophy. Left ventricular diastolic function could

## 2022-12-28 NOTE — Progress Notes (Signed)
Consulted by primary RN for eval of current 22g in R forearm. IV flushed with blood return. No redness, pain, or swelling noted around site in arm. Primary Rn notified.

## 2022-12-28 NOTE — Consult Note (Addendum)
of mitral stenosis.   4. The aortic valve is tricuspid. Aortic valve regurgitation is not  visualized. Aortic valve sclerosis is present, with no evidence of aortic  valve stenosis.   5. The inferior vena cava is normal in size with greater than 50%  respiratory variability, suggesting right atrial pressure of 3 mmHg.    Laboratory Data:  High Sensitivity Troponin:   Recent Labs  Lab 12/26/22 1558 12/26/22 1827  TROPONINIHS 50* 39*     Chemistry Recent Labs  Lab 12/26/22 1157 12/26/22 1558 12/27/22 0130 12/28/22 0035  NA 127*  --  135 135  K 4.2  --  3.3* 3.8  CL 93*  --  102 104  CO2 24  --  24 21*  GLUCOSE 580*  --  217* 236*  BUN 49*  --  34* 30*  CREATININE 2.44*  --  2.21* 2.15*  CALCIUM 8.3*  --  7.6* 7.8*  MG  --  2.3  --  2.2  GFRNONAA 27*  --  30* 31*  ANIONGAP 10  --  9 10    Recent Labs  Lab 12/26/22 1157  PROT 6.9  ALBUMIN 3.1*  AST 12*  ALT 9  ALKPHOS 61  BILITOT 0.8   Lipids No results for input(s): "CHOL", "TRIG", "HDL", "LABVLDL", "LDLCALC", "CHOLHDL" in the last 168 hours.  Hematology Recent Labs  Lab 12/26/22 1157 12/27/22 0130 12/28/22 0035  WBC 6.3 6.2 6.6  RBC 3.54* 3.06* 3.41*  HGB 10.0* 9.6* 9.6*  HCT 29.3* 26.2* 28.7*  MCV 82.8 85.6 84.2  MCH 28.2 31.4 28.2  MCHC 34.1 36.6* 33.4  RDW 13.2 13.2 13.4  PLT 201 218 242   Thyroid  Recent Labs  Lab 12/26/22 1406  TSH 0.995    BNPNo results for input(s): "BNP", "PROBNP" in the last 168 hours.  DDimer No results for input(s): "DDIMER" in the last 168 hours.   Radiology/Studies:  DG Abd 2 Views  Result Date: 12/27/2022 CLINICAL DATA:  Distended abdomen EXAM: ABDOMEN - 2 VIEW  COMPARISON:  None Available. FINDINGS: Nonobstructive bowel gas pattern. No organomegaly, free air or suspicious calcification. No acute bony abnormality. IMPRESSION: No acute findings. Electronically Signed   By: Charlett Nose M.D.   On: 12/27/2022 23:31   ECHOCARDIOGRAM COMPLETE  Result Date: 12/27/2022    ECHOCARDIOGRAM REPORT   Patient Name:   Cory [name] Date of Exam: 12/27/2022 Medical Rec #:  [id number]    Height:       71.0 in Accession #:    [id number]   Weight:       195.0 lb Date of Birth:  11/15/1945    BSA:          2.086 m Patient Age:    77 years     BP:           118/76 mmHg Patient Gender: M            HR:           77 bpm. Exam Location:  Inpatient Procedure: 2D Echo, Color Doppler and Cardiac Doppler Indications:    I48.92* Unspecified atrial flutter  History:        Patient has no prior history of Echocardiogram examinations.                 Risk Factors:Hypertension, Diabetes and Dyslipidemia.  Sonographer:    Irving Burton Senior RDCS Referring Phys: [id number] VASUNDHRA RATHORE IMPRESSIONS  1. Left ventricular ejection fraction, by estimation, is 60  of mitral stenosis.   4. The aortic valve is tricuspid. Aortic valve regurgitation is not  visualized. Aortic valve sclerosis is present, with no evidence of aortic  valve stenosis.   5. The inferior vena cava is normal in size with greater than 50%  respiratory variability, suggesting right atrial pressure of 3 mmHg.    Laboratory Data:  High Sensitivity Troponin:   Recent Labs  Lab 12/26/22 1558 12/26/22 1827  TROPONINIHS 50* 39*     Chemistry Recent Labs  Lab 12/26/22 1157 12/26/22 1558 12/27/22 0130 12/28/22 0035  NA 127*  --  135 135  K 4.2  --  3.3* 3.8  CL 93*  --  102 104  CO2 24  --  24 21*  GLUCOSE 580*  --  217* 236*  BUN 49*  --  34* 30*  CREATININE 2.44*  --  2.21* 2.15*  CALCIUM 8.3*  --  7.6* 7.8*  MG  --  2.3  --  2.2  GFRNONAA 27*  --  30* 31*  ANIONGAP 10  --  9 10    Recent Labs  Lab 12/26/22 1157  PROT 6.9  ALBUMIN 3.1*  AST 12*  ALT 9  ALKPHOS 61  BILITOT 0.8   Lipids No results for input(s): "CHOL", "TRIG", "HDL", "LABVLDL", "LDLCALC", "CHOLHDL" in the last 168 hours.  Hematology Recent Labs  Lab 12/26/22 1157 12/27/22 0130 12/28/22 0035  WBC 6.3 6.2 6.6  RBC 3.54* 3.06* 3.41*  HGB 10.0* 9.6* 9.6*  HCT 29.3* 26.2* 28.7*  MCV 82.8 85.6 84.2  MCH 28.2 31.4 28.2  MCHC 34.1 36.6* 33.4  RDW 13.2 13.2 13.4  PLT 201 218 242   Thyroid  Recent Labs  Lab 12/26/22 1406  TSH 0.995    BNPNo results for input(s): "BNP", "PROBNP" in the last 168 hours.  DDimer No results for input(s): "DDIMER" in the last 168 hours.   Radiology/Studies:  DG Abd 2 Views  Result Date: 12/27/2022 CLINICAL DATA:  Distended abdomen EXAM: ABDOMEN - 2 VIEW  COMPARISON:  None Available. FINDINGS: Nonobstructive bowel gas pattern. No organomegaly, free air or suspicious calcification. No acute bony abnormality. IMPRESSION: No acute findings. Electronically Signed   By: Charlett Nose M.D.   On: 12/27/2022 23:31   ECHOCARDIOGRAM COMPLETE  Result Date: 12/27/2022    ECHOCARDIOGRAM REPORT   Patient Name:   Cory [name] Date of Exam: 12/27/2022 Medical Rec #:  [id number]    Height:       71.0 in Accession #:    [id number]   Weight:       195.0 lb Date of Birth:  11/15/1945    BSA:          2.086 m Patient Age:    77 years     BP:           118/76 mmHg Patient Gender: M            HR:           77 bpm. Exam Location:  Inpatient Procedure: 2D Echo, Color Doppler and Cardiac Doppler Indications:    I48.92* Unspecified atrial flutter  History:        Patient has no prior history of Echocardiogram examinations.                 Risk Factors:Hypertension, Diabetes and Dyslipidemia.  Sonographer:    Irving Burton Senior RDCS Referring Phys: [id number] VASUNDHRA RATHORE IMPRESSIONS  1. Left ventricular ejection fraction, by estimation, is 60  of mitral stenosis.   4. The aortic valve is tricuspid. Aortic valve regurgitation is not  visualized. Aortic valve sclerosis is present, with no evidence of aortic  valve stenosis.   5. The inferior vena cava is normal in size with greater than 50%  respiratory variability, suggesting right atrial pressure of 3 mmHg.    Laboratory Data:  High Sensitivity Troponin:   Recent Labs  Lab 12/26/22 1558 12/26/22 1827  TROPONINIHS 50* 39*     Chemistry Recent Labs  Lab 12/26/22 1157 12/26/22 1558 12/27/22 0130 12/28/22 0035  NA 127*  --  135 135  K 4.2  --  3.3* 3.8  CL 93*  --  102 104  CO2 24  --  24 21*  GLUCOSE 580*  --  217* 236*  BUN 49*  --  34* 30*  CREATININE 2.44*  --  2.21* 2.15*  CALCIUM 8.3*  --  7.6* 7.8*  MG  --  2.3  --  2.2  GFRNONAA 27*  --  30* 31*  ANIONGAP 10  --  9 10    Recent Labs  Lab 12/26/22 1157  PROT 6.9  ALBUMIN 3.1*  AST 12*  ALT 9  ALKPHOS 61  BILITOT 0.8   Lipids No results for input(s): "CHOL", "TRIG", "HDL", "LABVLDL", "LDLCALC", "CHOLHDL" in the last 168 hours.  Hematology Recent Labs  Lab 12/26/22 1157 12/27/22 0130 12/28/22 0035  WBC 6.3 6.2 6.6  RBC 3.54* 3.06* 3.41*  HGB 10.0* 9.6* 9.6*  HCT 29.3* 26.2* 28.7*  MCV 82.8 85.6 84.2  MCH 28.2 31.4 28.2  MCHC 34.1 36.6* 33.4  RDW 13.2 13.2 13.4  PLT 201 218 242   Thyroid  Recent Labs  Lab 12/26/22 1406  TSH 0.995    BNPNo results for input(s): "BNP", "PROBNP" in the last 168 hours.  DDimer No results for input(s): "DDIMER" in the last 168 hours.   Radiology/Studies:  DG Abd 2 Views  Result Date: 12/27/2022 CLINICAL DATA:  Distended abdomen EXAM: ABDOMEN - 2 VIEW  COMPARISON:  None Available. FINDINGS: Nonobstructive bowel gas pattern. No organomegaly, free air or suspicious calcification. No acute bony abnormality. IMPRESSION: No acute findings. Electronically Signed   By: Charlett Nose M.D.   On: 12/27/2022 23:31   ECHOCARDIOGRAM COMPLETE  Result Date: 12/27/2022    ECHOCARDIOGRAM REPORT   Patient Name:   Cory [name] Date of Exam: 12/27/2022 Medical Rec #:  [id number]    Height:       71.0 in Accession #:    [id number]   Weight:       195.0 lb Date of Birth:  11/15/1945    BSA:          2.086 m Patient Age:    77 years     BP:           118/76 mmHg Patient Gender: M            HR:           77 bpm. Exam Location:  Inpatient Procedure: 2D Echo, Color Doppler and Cardiac Doppler Indications:    I48.92* Unspecified atrial flutter  History:        Patient has no prior history of Echocardiogram examinations.                 Risk Factors:Hypertension, Diabetes and Dyslipidemia.  Sonographer:    Irving Burton Senior RDCS Referring Phys: [id number] VASUNDHRA RATHORE IMPRESSIONS  1. Left ventricular ejection fraction, by estimation, is 60  Cardiology Consultation   Patient ID: Cory [name] MRN: [id number]; DOB: Mar 14, 1946  Admit date: 12/26/2022 Date of Consult: 12/28/2022  PCP:  Frederich Chick., MD   [facility] Providers Cardiologist: [facility] (Dr. Mercy Riding)  Patient Profile:   Cory [name] is a 77 y.o. 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 Jerral Ralph.    History of Present Illness:   Mr. Revette is a 77 year old male with the above history. Patient denies any history of CAD, CHF, or any abnormal heart rhythm prior to this admission. He does have Torsemide and Metolazone listed under his PTA medication and he states this was prescribed for left lower extremity edema a couple of months ago by Dr. Mercy Riding (Cardiologist at [facility]). He states edema essentially completely resolved with this.  Patient presented to the ED on 12/26/2022 for evaluation of confusion and poor PO intake with concern of failure to thrive. Upon arrival to the ED, EKG showed normal sinus rhythm, rate 71 bpm, with isolated T wave inversion in lead III and otherwise non-specific T wave changes. High-sensitivity troponin minimally elevated and flat at 50 >> 39. WBC 6.3, Hgb 10.0, Plts 201. Na 127, K 4.2, Glucose 580, BUN 49, Cr 2.44 (baseline around 0.7 to 0.9). Albumin 3.1, AST 12, ALT 9, Alk Phos 61, Total Bili 0.8. Lipase mildly elevated at 58. Lactic acid normal. CK normal. Ammonia normal. Urinalysis showed >1,000 glucose, moderate Hgb, 30 protein, moderate leukocytes, many bacteria, and specific gravity of 1.016 (within normal limits). Head CT showed no acute findings. Abdominal x-ray showed no acute findings. While in the ED, he developed atrial flutter with intermittent RVR.  Patient was started on IV fluids and admitted for AKI felt to be  due to dehydration. He was also started on IV Cardizem and IV Heparin for his atrial flutter and an insulin drip for his severe hyperglycemia. Echo showed LVEF of 60-65% of no regional wall motion abnormalities and moderate LHV, normal RV, and no significant valvular disease. Cardiology consulted for further evaluation.  Patient states he was in his usual states of health until about 3 weeks ago when he had some vomiting and then following this had no appetite. He has had very poor PO intake since that time. He denies any diarrhea.  He denies any cardiac symptoms. No chest pain, shortness of breath, orthopnea, or PND. Lower extremity edema has been well controlled on Torsemide and Metolazone. He denies any palpitations, lightheadedness, dizziness, or syncope. He reports occasional cough and nasal congestion (likely from allergies) but no recent fevers or respiratory illnesses. He notes occasional possible abnormal bleeding in his stools but had difficult elaborating on this.   Past Medical History:  Diagnosis Date   Diabetes mellitus without complication (HCC)    Hyperlipidemia    Hypertension    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   Sepsis (HCC) 11/27/2020    Past Surgical History:  Procedure Laterality Date   AMPUTATION Right 12/04/2020   Procedure: RIGHT BELOW KNEE AMPUTATION;  Surgeon: Nadara Mustard, MD;  Location: [facility] OR;  Service: Orthopedics;  Laterality: Right;   APPENDECTOMY     APPLICATION OF WOUND VAC Right 12/04/2020   Procedure: APPLICATION OF WOUND VAC;  Surgeon: Nadara Mustard, MD;  Location: [facility] OR;  Service: Orthopedics;  Laterality:  of mitral stenosis.   4. The aortic valve is tricuspid. Aortic valve regurgitation is not  visualized. Aortic valve sclerosis is present, with no evidence of aortic  valve stenosis.   5. The inferior vena cava is normal in size with greater than 50%  respiratory variability, suggesting right atrial pressure of 3 mmHg.    Laboratory Data:  High Sensitivity Troponin:   Recent Labs  Lab 12/26/22 1558 12/26/22 1827  TROPONINIHS 50* 39*     Chemistry Recent Labs  Lab 12/26/22 1157 12/26/22 1558 12/27/22 0130 12/28/22 0035  NA 127*  --  135 135  K 4.2  --  3.3* 3.8  CL 93*  --  102 104  CO2 24  --  24 21*  GLUCOSE 580*  --  217* 236*  BUN 49*  --  34* 30*  CREATININE 2.44*  --  2.21* 2.15*  CALCIUM 8.3*  --  7.6* 7.8*  MG  --  2.3  --  2.2  GFRNONAA 27*  --  30* 31*  ANIONGAP 10  --  9 10    Recent Labs  Lab 12/26/22 1157  PROT 6.9  ALBUMIN 3.1*  AST 12*  ALT 9  ALKPHOS 61  BILITOT 0.8   Lipids No results for input(s): "CHOL", "TRIG", "HDL", "LABVLDL", "LDLCALC", "CHOLHDL" in the last 168 hours.  Hematology Recent Labs  Lab 12/26/22 1157 12/27/22 0130 12/28/22 0035  WBC 6.3 6.2 6.6  RBC 3.54* 3.06* 3.41*  HGB 10.0* 9.6* 9.6*  HCT 29.3* 26.2* 28.7*  MCV 82.8 85.6 84.2  MCH 28.2 31.4 28.2  MCHC 34.1 36.6* 33.4  RDW 13.2 13.2 13.4  PLT 201 218 242   Thyroid  Recent Labs  Lab 12/26/22 1406  TSH 0.995    BNPNo results for input(s): "BNP", "PROBNP" in the last 168 hours.  DDimer No results for input(s): "DDIMER" in the last 168 hours.   Radiology/Studies:  DG Abd 2 Views  Result Date: 12/27/2022 CLINICAL DATA:  Distended abdomen EXAM: ABDOMEN - 2 VIEW  COMPARISON:  None Available. FINDINGS: Nonobstructive bowel gas pattern. No organomegaly, free air or suspicious calcification. No acute bony abnormality. IMPRESSION: No acute findings. Electronically Signed   By: Charlett Nose M.D.   On: 12/27/2022 23:31   ECHOCARDIOGRAM COMPLETE  Result Date: 12/27/2022    ECHOCARDIOGRAM REPORT   Patient Name:   Cory [name] Date of Exam: 12/27/2022 Medical Rec #:  [id number]    Height:       71.0 in Accession #:    [id number]   Weight:       195.0 lb Date of Birth:  11/15/1945    BSA:          2.086 m Patient Age:    77 years     BP:           118/76 mmHg Patient Gender: M            HR:           77 bpm. Exam Location:  Inpatient Procedure: 2D Echo, Color Doppler and Cardiac Doppler Indications:    I48.92* Unspecified atrial flutter  History:        Patient has no prior history of Echocardiogram examinations.                 Risk Factors:Hypertension, Diabetes and Dyslipidemia.  Sonographer:    Irving Burton Senior RDCS Referring Phys: [id number] VASUNDHRA RATHORE IMPRESSIONS  1. Left ventricular ejection fraction, by estimation, is 60  of mitral stenosis.   4. The aortic valve is tricuspid. Aortic valve regurgitation is not  visualized. Aortic valve sclerosis is present, with no evidence of aortic  valve stenosis.   5. The inferior vena cava is normal in size with greater than 50%  respiratory variability, suggesting right atrial pressure of 3 mmHg.    Laboratory Data:  High Sensitivity Troponin:   Recent Labs  Lab 12/26/22 1558 12/26/22 1827  TROPONINIHS 50* 39*     Chemistry Recent Labs  Lab 12/26/22 1157 12/26/22 1558 12/27/22 0130 12/28/22 0035  NA 127*  --  135 135  K 4.2  --  3.3* 3.8  CL 93*  --  102 104  CO2 24  --  24 21*  GLUCOSE 580*  --  217* 236*  BUN 49*  --  34* 30*  CREATININE 2.44*  --  2.21* 2.15*  CALCIUM 8.3*  --  7.6* 7.8*  MG  --  2.3  --  2.2  GFRNONAA 27*  --  30* 31*  ANIONGAP 10  --  9 10    Recent Labs  Lab 12/26/22 1157  PROT 6.9  ALBUMIN 3.1*  AST 12*  ALT 9  ALKPHOS 61  BILITOT 0.8   Lipids No results for input(s): "CHOL", "TRIG", "HDL", "LABVLDL", "LDLCALC", "CHOLHDL" in the last 168 hours.  Hematology Recent Labs  Lab 12/26/22 1157 12/27/22 0130 12/28/22 0035  WBC 6.3 6.2 6.6  RBC 3.54* 3.06* 3.41*  HGB 10.0* 9.6* 9.6*  HCT 29.3* 26.2* 28.7*  MCV 82.8 85.6 84.2  MCH 28.2 31.4 28.2  MCHC 34.1 36.6* 33.4  RDW 13.2 13.2 13.4  PLT 201 218 242   Thyroid  Recent Labs  Lab 12/26/22 1406  TSH 0.995    BNPNo results for input(s): "BNP", "PROBNP" in the last 168 hours.  DDimer No results for input(s): "DDIMER" in the last 168 hours.   Radiology/Studies:  DG Abd 2 Views  Result Date: 12/27/2022 CLINICAL DATA:  Distended abdomen EXAM: ABDOMEN - 2 VIEW  COMPARISON:  None Available. FINDINGS: Nonobstructive bowel gas pattern. No organomegaly, free air or suspicious calcification. No acute bony abnormality. IMPRESSION: No acute findings. Electronically Signed   By: Charlett Nose M.D.   On: 12/27/2022 23:31   ECHOCARDIOGRAM COMPLETE  Result Date: 12/27/2022    ECHOCARDIOGRAM REPORT   Patient Name:   Cory [name] Date of Exam: 12/27/2022 Medical Rec #:  [id number]    Height:       71.0 in Accession #:    [id number]   Weight:       195.0 lb Date of Birth:  11/15/1945    BSA:          2.086 m Patient Age:    77 years     BP:           118/76 mmHg Patient Gender: M            HR:           77 bpm. Exam Location:  Inpatient Procedure: 2D Echo, Color Doppler and Cardiac Doppler Indications:    I48.92* Unspecified atrial flutter  History:        Patient has no prior history of Echocardiogram examinations.                 Risk Factors:Hypertension, Diabetes and Dyslipidemia.  Sonographer:    Irving Burton Senior RDCS Referring Phys: [id number] VASUNDHRA RATHORE IMPRESSIONS  1. Left ventricular ejection fraction, by estimation, is 60  Cardiology Consultation   Patient ID: Cory [name] MRN: [id number]; DOB: Mar 14, 1946  Admit date: 12/26/2022 Date of Consult: 12/28/2022  PCP:  Frederich Chick., MD   [facility] Providers Cardiologist: [facility] (Dr. Mercy Riding)  Patient Profile:   Cory [name] is a 77 y.o. 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 Jerral Ralph.    History of Present Illness:   Mr. Revette is a 77 year old male with the above history. Patient denies any history of CAD, CHF, or any abnormal heart rhythm prior to this admission. He does have Torsemide and Metolazone listed under his PTA medication and he states this was prescribed for left lower extremity edema a couple of months ago by Dr. Mercy Riding (Cardiologist at [facility]). He states edema essentially completely resolved with this.  Patient presented to the ED on 12/26/2022 for evaluation of confusion and poor PO intake with concern of failure to thrive. Upon arrival to the ED, EKG showed normal sinus rhythm, rate 71 bpm, with isolated T wave inversion in lead III and otherwise non-specific T wave changes. High-sensitivity troponin minimally elevated and flat at 50 >> 39. WBC 6.3, Hgb 10.0, Plts 201. Na 127, K 4.2, Glucose 580, BUN 49, Cr 2.44 (baseline around 0.7 to 0.9). Albumin 3.1, AST 12, ALT 9, Alk Phos 61, Total Bili 0.8. Lipase mildly elevated at 58. Lactic acid normal. CK normal. Ammonia normal. Urinalysis showed >1,000 glucose, moderate Hgb, 30 protein, moderate leukocytes, many bacteria, and specific gravity of 1.016 (within normal limits). Head CT showed no acute findings. Abdominal x-ray showed no acute findings. While in the ED, he developed atrial flutter with intermittent RVR.  Patient was started on IV fluids and admitted for AKI felt to be  due to dehydration. He was also started on IV Cardizem and IV Heparin for his atrial flutter and an insulin drip for his severe hyperglycemia. Echo showed LVEF of 60-65% of no regional wall motion abnormalities and moderate LHV, normal RV, and no significant valvular disease. Cardiology consulted for further evaluation.  Patient states he was in his usual states of health until about 3 weeks ago when he had some vomiting and then following this had no appetite. He has had very poor PO intake since that time. He denies any diarrhea.  He denies any cardiac symptoms. No chest pain, shortness of breath, orthopnea, or PND. Lower extremity edema has been well controlled on Torsemide and Metolazone. He denies any palpitations, lightheadedness, dizziness, or syncope. He reports occasional cough and nasal congestion (likely from allergies) but no recent fevers or respiratory illnesses. He notes occasional possible abnormal bleeding in his stools but had difficult elaborating on this.   Past Medical History:  Diagnosis Date   Diabetes mellitus without complication (HCC)    Hyperlipidemia    Hypertension    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   Sepsis (HCC) 11/27/2020    Past Surgical History:  Procedure Laterality Date   AMPUTATION Right 12/04/2020   Procedure: RIGHT BELOW KNEE AMPUTATION;  Surgeon: Nadara Mustard, MD;  Location: [facility] OR;  Service: Orthopedics;  Laterality: Right;   APPENDECTOMY     APPLICATION OF WOUND VAC Right 12/04/2020   Procedure: APPLICATION OF WOUND VAC;  Surgeon: Nadara Mustard, MD;  Location: [facility] OR;  Service: Orthopedics;  Laterality:

## 2022-12-29 ENCOUNTER — Inpatient Hospital Stay (HOSPITAL_COMMUNITY): Payer: Medicare HMO

## 2022-12-29 DIAGNOSIS — I4892 Unspecified atrial flutter: Secondary | ICD-10-CM | POA: Diagnosis not present

## 2022-12-29 DIAGNOSIS — N179 Acute kidney failure, unspecified: Secondary | ICD-10-CM | POA: Diagnosis not present

## 2022-12-29 DIAGNOSIS — G9341 Metabolic encephalopathy: Secondary | ICD-10-CM | POA: Diagnosis not present

## 2022-12-29 DIAGNOSIS — R748 Abnormal levels of other serum enzymes: Secondary | ICD-10-CM | POA: Diagnosis not present

## 2022-12-29 LAB — GLUCOSE, CAPILLARY
Glucose-Capillary: 135 mg/dL — ABNORMAL HIGH (ref 70–99)
Glucose-Capillary: 193 mg/dL — ABNORMAL HIGH (ref 70–99)
Glucose-Capillary: 80 mg/dL (ref 70–99)
Glucose-Capillary: 134 mg/dL — ABNORMAL HIGH (ref 70–99)

## 2022-12-29 LAB — BASIC METABOLIC PANEL
Anion gap: 9 (ref 5–15)
BUN: 21 mg/dL (ref 8–23)
CO2: 20 mmol/L — ABNORMAL LOW (ref 22–32)
Calcium: 7.8 mg/dL — ABNORMAL LOW (ref 8.9–10.3)
Chloride: 106 mmol/L (ref 98–111)
Creatinine, Ser: 2.04 mg/dL — ABNORMAL HIGH (ref 0.61–1.24)
GFR, Estimated: 33 mL/min — ABNORMAL LOW (ref >60)
Glucose, Bld: 166 mg/dL — ABNORMAL HIGH (ref 70–99)
Potassium: 3.5 mmol/L (ref 3.5–5.1)
Sodium: 135 mmol/L (ref 135–145)

## 2022-12-29 MED ORDER — LACTATED RINGERS IV SOLN: INTRAVENOUS | Status: AC

## 2022-12-29 MED ORDER — INSULIN GLARGINE-YFGN 100 UNIT/ML ~~LOC~~ SOLN
40.0000 [IU] | Freq: Every day | SUBCUTANEOUS | Status: DC
  Administered 2022-12-29: 40 [IU] via SUBCUTANEOUS
  Filled 2022-12-29 (×3): qty 0.4

## 2022-12-29 NOTE — Progress Notes (Signed)
Occupational Therapy Treatment Patient Details Name: Cory [name] MRN: [id number] DOB: 1945/08/02 Today's Date: 12/29/2022   History of present illness 77 yo male admitted 8/19 with decreased intake, dark urine, weakness and possible UTI. Pt with Aflutter in ED. 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 Patient able to donn shoes seated on EOB with min assist and use of shoehorn due to tight fit. Patient able to perform functional transfers, mobility, standing tasks with supervision for safety. Discharge recommendations continue to be appropriate. Acute OT to continue to follow.       If plan is discharge home, recommend the following:  A little help with bathing/dressing/bathroom;Assistance with cooking/housework;Assist for transportation;Help with stairs or ramp for entrance   Equipment Recommendations  None recommended by OT    Recommendations for Other Services      Precautions / Restrictions Precautions Precautions: Fall;Other (comment) Precaution Comments: Rt BKA w/ prosthetic Restrictions Weight Bearing Restrictions: No       Mobility Bed Mobility Overal bed mobility: Needs Assistance Bed Mobility: Supine to Sit, Sit to Supine     Supine to sit: Modified independent (Device/Increase time) Sit to supine: Modified independent (Device/Increase time)   General bed mobility comments: able to get to EOB and back to supine without assistance    Transfers Overall transfer level: Needs assistance Equipment used: Rollator (4 wheels) Transfers: Sit to/from Stand Sit to Stand: Supervision           General transfer comment: supervision for safety and line managment     Balance Overall balance assessment: Needs assistance Sitting-balance support: No upper extremity supported, Feet supported Sitting balance-Leahy Scale: Good Sitting balance - Comments: seated on EOB upon entry   Standing balance support:  Single extremity supported, Bilateral upper extremity supported, During functional activity Standing balance-Leahy Scale: Poor Standing balance comment: able to stand at sink with one extremity support                           ADL either performed or assessed with clinical judgement   ADL Overall ADL's : Needs assistance/impaired     Grooming: Supervision/safety;Standing Grooming Details (indicate cue type and reason): at sink             Lower Body Dressing: Minimal assistance;With adaptive equipment;Sitting/lateral leans Lower Body Dressing Details (indicate cue type and reason): min assist for right shoe and assistance with left shoe with shoehorn due to tight fit                    Extremity/Trunk Assessment              Vision       Perception     Praxis      Cognition Arousal: Alert Behavior During Therapy: WFL for tasks assessed/performed Overall Cognitive Status: Within Functional Limits for tasks assessed                                          Exercises      Shoulder Instructions       General Comments      Pertinent Vitals/ Pain       Pain Assessment Pain Assessment: No/denies pain  Home Living  Prior Functioning/Environment              Frequency  Min 1X/week        Progress Toward Goals  OT Goals(current goals can now be found in the care plan section)  Progress towards OT goals: Progressing toward goals  Acute Rehab OT Goals Patient Stated Goal: go home OT Goal Formulation: With patient Time For Goal Achievement: 01/09/23 Potential to Achieve Goals: Good ADL Goals Pt Will Perform Lower Body Bathing: with modified independence;sit to/from stand Pt Will Perform Lower Body Dressing: with modified independence;with adaptive equipment;sit to/from stand Pt Will Transfer to Toilet: with modified independence;ambulating Additional  ADL Goal #1: Pt to verbalize at least 3 fall prevention strategies to implement at home  Plan      Co-evaluation                 AM-PAC OT "6 Clicks" Daily Activity     Outcome Measure   Help from another person eating meals?: None Help from another person taking care of personal grooming?: A Little Help from another person toileting, which includes using toliet, bedpan, or urinal?: A Little Help from another person bathing (including washing, rinsing, drying)?: A Little Help from another person to put on and taking off regular upper body clothing?: A Little Help from another person to put on and taking off regular lower body clothing?: A Little 6 Click Score: 19    End of Session Equipment Utilized During Treatment: Gait belt;Rollator (4 wheels)  OT Visit Diagnosis: Unsteadiness on feet (R26.81);Other abnormalities of gait and mobility (R26.89);Muscle weakness (generalized) (M62.81)   Activity Tolerance Patient tolerated treatment well   Patient Left in chair;with call bell/phone within reach;with nursing/sitter in room   Nurse Communication Mobility status        Time: 1610-9604 OT Time Calculation (min): 29 min  Charges: OT General Charges $OT Visit: 1 Visit OT Treatments $Self Care/Home Management : 8-22 mins $Therapeutic Activity: 8-22 mins  Alfonse Flavors, OTA Acute Rehabilitation Services  Office [phone]   Dewain Penning 12/29/2022, 1:36 PM

## 2022-12-29 NOTE — Progress Notes (Signed)
   Patient Name: Marselino Escribano Date of Encounter: 12/29/2022 [facility] Cardiologist: [facility] (Dr. Mercy Riding)   Interval Summary  .    No acute events overnight.  Remains in atrial flutter with variable conduction.  Vital Signs .    Vitals:   12/28/22 1633 12/28/22 2002 12/28/22 2315 12/29/22 0355  BP: 126/82 (!) 140/73 137/80 120/68  Pulse: 72 85 86 84  Resp: 18 19 (!) 22 (!) 21  Temp: 98.6 F (37 C) 98.5 F (36.9 C) 98.4 F (36.9 C) 99 F (37.2 C)  TempSrc: Oral Oral Oral Oral  SpO2:    97%  Weight:      Height:        Intake/Output Summary (Last 24 hours) at 12/29/2022 0947 Last data filed at 12/29/2022 0415 Gross per 24 hour  Intake 551.16 ml  Output 750 ml  Net -198.84 ml      12/26/2022    4:44 PM 12/26/2022    2:15 PM 11/07/2022   11:05 AM  Last 3 Weights  Weight (lbs) 195 lb 195 lb 210 lb  Weight (kg) 88.451 kg 88.451 kg 95.255 kg      Telemetry/ECG    Atrial flutter with variable conduction - Personally Reviewed  Physical Exam .   GEN: No acute distress.   Neck: No JVD Cardiac: iRRR, no murmurs, rubs, or gallops.  Respiratory: Clear to auscultation bilaterally. GI: Soft, nontender, non-distended  MS: s/p R BKA; no edema LLE  Assessment & Plan .      Atrial flutter:  Cont metoprolol and Eliquis.  No need for TEE-CV at this time as patient is asymptomatic and tolerating well.  Can follow up with outpatient cardiologist regarding further management.   Hypertension:  BP well controlled today. T2DM: Consider SGLTi and ARB once Cr has plateaued AKI:  Cr slowly improving; per nursing possible d/c tomorrow.  Will sign off, call with ?s.  For questions or updates, please contact [facility] Please consult [email or web address] for contact info under        Signed, Orbie Pyo, MD

## 2022-12-29 NOTE — Inpatient Diabetes Management (Signed)
Inpatient Diabetes Program Recommendations  AACE/ADA: New Consensus Statement on Inpatient Glycemic Control (2015)  Target Ranges:  Prepandial:   less than 140 mg/dL      Peak postprandial:   less than 180 mg/dL (1-2 hours)      Critically ill patients:  140 - 180 mg/dL   Lab Results  Component Value Date   GLUCAP 135 (H) 12/29/2022   HGBA1C 14.0 (H) 12/27/2022    Review of Glycemic Control  Latest Reference Range & Units 12/28/22 12:00 12/28/22 16:32 12/28/22 21:03 12/29/22 08:25  Glucose-Capillary 70 - 99 mg/dL 474 (H) 259 (H) 563 (H) 135 (H)   Diabetes history: DM  Outpatient Diabetes medications:  Glucotrol 10 mg bid Lantus 45 units daily Current orders for Inpatient glycemic control:  Novolog 0-9 units tid with meals and HS Novolog 4 units tid with meals Semglee 40 units daily  Inpatient Diabetes Program Recommendations:    Blood sugars much better this AM.  Will talk to patient regarding A1C today.    Thanks,  Beryl Meager, RN, BC-ADM Inpatient Diabetes Coordinator Pager [phone]  (8a-5p)

## 2022-12-29 NOTE — Progress Notes (Signed)
PROGRESS NOTE        PATIENT DETAILS Name: Cory [name] Age: 77 y.o. Sex: male Date of Birth: April 06, 1946 Admit Date: 12/26/2022 Admitting Physician Synetta Fail, MD [id number], [name], MD  Brief Summary: Patient is a 77 y.o.  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  He was brought to the ED on 8/19-found to have acute metabolic encephalopathy, hyperglycemic nonketotic hyperglycemia, AKI, a flutter with RVR and subsequently admitted to the hospitalist service.  Significant events: 8/19>> admit to [facility]  Significant studies: 8/19>> CT head: No acute intracranial abnormality 8/19>> CXR: No active disease  Significant microbiology data: None  Procedures: None  Consults: None  Subjective: No major issues overnight.  No chest pain or shortness of breath.  Objective: Vitals: Blood pressure 120/68, pulse 84, temperature 99 F (37.2 C), temperature source Oral, resp. rate (!) 21, height 5\' 11"  (1.803 m), weight 88.5 kg, SpO2 97%.   Exam: Gen Exam:Alert awake-not in any distress HEENT:atraumatic, normocephalic Chest: B/L clear to auscultation anteriorly CVS:S1S2 regular Abdomen:soft non tender, non distended Extremities:no edema Neurology: Non focal Skin: no rash  Pertinent Labs/Radiology:    Latest Ref Rng & Units 12/28/2022   12:35 AM 12/27/2022    1:30 AM 12/26/2022   11:57 AM  CBC  WBC 4.0 - 10.5 K/uL 6.6  6.2  6.3   Hemoglobin 13.0 - 17.0 g/dL 9.6  9.6  78.4   Hematocrit 39.0 - 52.0 % 28.7  26.2  29.3   Platelets 150 - 400 K/uL 242  218  201     Lab Results  Component Value Date   NA 135 12/29/2022   K 3.5 12/29/2022   CL 106 12/29/2022   CO2 20 (L) 12/29/2022      Assessment/Plan: Acute metabolic encephalopathy Due to AKI/nonketotic hyperglycemia Apparently was confused  when he first presented-this has resolved-patient is completely awake and alert.  Hyperglycemic hyperosmolar nonketotic state Required insulin drip on initial presentation Has been transitioned to SQ insulin-see below.  AKI Suspect due to osmotic diuresis due to hyperglycemia/some hemodynamic mediated injury in the setting of HCTZ/losartan use Creatinine downtrending very slowly Frequent bladder scans UA with minimal proteinuria Renal ultrasound today Continue gentle hydration Avoid nephrotoxic agents Recheck electrolytes tomorrow morning.  New onset atrial flutter with RVR Rate controlled on metoprolol Continue Eliquis  (CHA2DS2-VASc score of at least 4) Echo with stable EF TSH stable Telemetry monitoring Cardiology consult appreciated plans are for outpatient follow-up with cardiology and cardioversion if he is still in a flutter.  Hypokalemia Repleted.  ?  UTI versus asymptomatic bacteriuria No symptom Looks like urine culture was never done No longer on antibiotics-was given 1 dose of Rocephin in the ED-and none since then Since likely asymptomatic bacteriuria-reasonable to continue to monitor off antibiotics for now.  DM-2 (A1c 14 on 8/20) with uncontrolled hyperglycemia/poor long-term control CBGs on the higher side Will get first dose of 35 units of Semglee today Had 4 units of scheduled Premeal NovoLog Follow CBGs and adjust accordingly.  Recent Labs    12/28/22 1632 12/28/22 2103 12/29/22 0825  GLUCAP 196* 239* 135*    Normocytic anemia Chronic issue Mild drop in hemoglobin is likely due to IV fluid dilution No  evidence of GI bleeding/blood loss Follow CBC  HTN BP controlled with metoprolol Losartan/HCTZ on hold due to AKI  HLD Statin  BPH Flomax  Debility/deconditioning PT/OT eval  BMI: Estimated body mass index is 27.2 kg/m as calculated from the following:   Height as of this encounter: 5\' 11"  (1.803 m).   Weight as of this encounter:  88.5 kg.   Code status:   Code Status: Full Code   DVT Prophylaxis: Eliquis  Family Communication: None at bedside  Disposition Plan: Status is: Observation The patient will require care spanning > 2 midnights and should be moved to inpatient because: Severity of illness   Planned Discharge Destination:Home health   Diet: Diet Order             Diet heart healthy/carb modified Room service appropriate? Yes; Fluid consistency: Thin  Diet effective now                     Antimicrobial agents: Anti-infectives (From admission, onward)    Start     Dose/Rate Route Frequency Ordered Stop   12/26/22 1645  cefTRIAXone (ROCEPHIN) 1 g in sodium chloride 0.9 % 100 mL IVPB        1 g 200 mL/hr over 30 Minutes Intravenous  Once 12/26/22 1636 12/26/22 1923        MEDICATIONS: Scheduled Meds:  apixaban  5 mg Oral BID   insulin aspart  0-5 Units Subcutaneous QHS   insulin aspart  0-9 Units Subcutaneous TID WC   insulin aspart  4 Units Subcutaneous TID WC   insulin glargine-yfgn  40 Units Subcutaneous Daily   metoprolol tartrate  50 mg Oral BID   tamsulosin  0.4 mg Oral Daily   Continuous Infusions:   PRN Meds:.acetaminophen **OR** acetaminophen, dextrose, metoprolol tartrate   I have personally reviewed following labs and imaging studies  LABORATORY DATA: CBC: Recent Labs  Lab 12/26/22 1157 12/27/22 0130 12/28/22 0035  WBC 6.3 6.2 6.6  NEUTROABS 4.6  --   --   HGB 10.0* 9.6* 9.6*  HCT 29.3* 26.2* 28.7*  MCV 82.8 85.6 84.2  PLT 201 218 242    Basic Metabolic Panel: Recent Labs  Lab 12/26/22 1157 12/26/22 1558 12/27/22 0130 12/28/22 0035 12/29/22 0256  NA 127*  --  135 135 135  K 4.2  --  3.3* 3.8 3.5  CL 93*  --  102 104 106  CO2 24  --  24 21* 20*  GLUCOSE 580*  --  217* 236* 166*  BUN 49*  --  34* 30* 21  CREATININE 2.44*  --  2.21* 2.15* 2.04*  CALCIUM 8.3*  --  7.6* 7.8* 7.8*  MG  --  2.3  --  2.2  --   PHOS  --   --   --  2.6  --      GFR: Estimated Creatinine Clearance: 33.3 mL/min (A) (by C-G formula based on SCr of 2.04 mg/dL (H)).  Liver Function Tests: Recent Labs  Lab 12/26/22 1157  AST 12*  ALT 9  ALKPHOS 61  BILITOT 0.8  PROT 6.9  ALBUMIN 3.1*   Recent Labs  Lab 12/26/22 1406 12/27/22 0130  LIPASE 58* 27   Recent Labs  Lab 12/26/22 1406  AMMONIA 30    Coagulation Profile: No results for input(s): "INR", "PROTIME" in the last 168 hours.  Cardiac Enzymes: Recent Labs  Lab 12/26/22 1406  CKTOTAL 122    BNP (last 3 results) No results  for input(s): "PROBNP" in the last 8760 hours.  Lipid Profile: No results for input(s): "CHOL", "HDL", "LDLCALC", "TRIG", "CHOLHDL", "LDLDIRECT" in the last 72 hours.  Thyroid Function Tests: Recent Labs    12/26/22 1406  TSH 0.995    Anemia Panel: No results for input(s): "VITAMINB12", "FOLATE", "FERRITIN", "TIBC", "IRON", "RETICCTPCT" in the last 72 hours.  Urine analysis:    Component Value Date/Time   COLORURINE YELLOW 12/26/2022 1530   APPEARANCEUR HAZY (A) 12/26/2022 1530   LABSPEC 1.016 12/26/2022 1530   PHURINE 5.5 12/26/2022 1530   GLUCOSEU >1,000 (A) 12/26/2022 1530   HGBUR MODERATE (A) 12/26/2022 1530   BILIRUBINUR NEGATIVE 12/26/2022 1530   KETONESUR NEGATIVE 12/26/2022 1530   PROTEINUR 30 (A) 12/26/2022 1530   NITRITE NEGATIVE 12/26/2022 1530   LEUKOCYTESUR MODERATE (A) 12/26/2022 1530    Sepsis Labs: Lactic Acid, Venous    Component Value Date/Time   LATICACIDVEN 1.5 12/26/2022 1558    MICROBIOLOGY: No results found for this or any previous visit (from the past 240 hour(s)).  RADIOLOGY STUDIES/RESULTS: DG Abd 2 Views  Result Date: 12/27/2022 CLINICAL DATA:  Distended abdomen EXAM: ABDOMEN - 2 VIEW COMPARISON:  None Available. FINDINGS: Nonobstructive bowel gas pattern. No organomegaly, free air or suspicious calcification. No acute bony abnormality. IMPRESSION: No acute findings. Electronically Signed   By: Charlett Nose M.D.   On: 12/27/2022 23:31     LOS: 2 days   Jeoffrey Massed, MD  [facility]    To contact the attending provider between 7A-7P or the covering provider during after hours 7P-7A, please log into the web site [email or web address] and access using universal [place] password for that web site. If you do not have the password, please call the hospital operator.  12/29/2022, 11:02 AM

## 2022-12-30 ENCOUNTER — Other Ambulatory Visit (HOSPITAL_COMMUNITY): Payer: Self-pay

## 2022-12-30 DIAGNOSIS — I4892 Unspecified atrial flutter: Secondary | ICD-10-CM | POA: Diagnosis not present

## 2022-12-30 DIAGNOSIS — R7989 Other specified abnormal findings of blood chemistry: Secondary | ICD-10-CM

## 2022-12-30 DIAGNOSIS — N179 Acute kidney failure, unspecified: Secondary | ICD-10-CM | POA: Diagnosis not present

## 2022-12-30 DIAGNOSIS — G9341 Metabolic encephalopathy: Secondary | ICD-10-CM | POA: Diagnosis not present

## 2022-12-30 LAB — BASIC METABOLIC PANEL
Anion gap: 10 (ref 5–15)
BUN: 14 mg/dL (ref 8–23)
CO2: 20 mmol/L — ABNORMAL LOW (ref 22–32)
Calcium: 8 mg/dL — ABNORMAL LOW (ref 8.9–10.3)
Chloride: 107 mmol/L (ref 98–111)
Creatinine, Ser: 1.84 mg/dL — ABNORMAL HIGH (ref 0.61–1.24)
GFR, Estimated: 38 mL/min — ABNORMAL LOW (ref >60)
Glucose, Bld: 64 mg/dL — ABNORMAL LOW (ref 70–99)
Potassium: 3.4 mmol/L — ABNORMAL LOW (ref 3.5–5.1)
Sodium: 137 mmol/L (ref 135–145)

## 2022-12-30 LAB — GLUCOSE, CAPILLARY
Glucose-Capillary: 71 mg/dL (ref 70–99)
Glucose-Capillary: 192 mg/dL — ABNORMAL HIGH (ref 70–99)
Glucose-Capillary: 237 mg/dL — ABNORMAL HIGH (ref 70–99)
Glucose-Capillary: 267 mg/dL — ABNORMAL HIGH (ref 70–99)

## 2022-12-30 MED ORDER — POTASSIUM CHLORIDE CRYS ER 20 MEQ PO TBCR
20.0000 meq | EXTENDED_RELEASE_TABLET | Freq: Once | ORAL | Status: AC
  Administered 2022-12-30: 20 meq via ORAL
  Filled 2022-12-30: qty 1

## 2022-12-30 MED ORDER — METOPROLOL TARTRATE 50 MG PO TABS
50.0000 mg | ORAL_TABLET | Freq: Two times a day (BID) | ORAL | 1 refills | Status: DC
  Filled 2022-12-30 – 2023-02-07 (×2): qty 60, 30d supply, fill #0

## 2022-12-30 MED ORDER — LANTUS SOLOSTAR 100 UNIT/ML ~~LOC~~ SOPN: 40.0000 [IU] | PEN_INJECTOR | Freq: Every day | SUBCUTANEOUS | Status: DC

## 2022-12-30 MED ORDER — APIXABAN 5 MG PO TABS
5.0000 mg | ORAL_TABLET | Freq: Two times a day (BID) | ORAL | 3 refills | Status: DC
Start: 2022-12-30 — End: 2023-05-22
  Filled 2022-12-30 – 2023-02-07 (×2): qty 60, 30d supply, fill #0

## 2022-12-30 MED ORDER — POTASSIUM CHLORIDE CRYS ER 20 MEQ PO TBCR
40.0000 meq | EXTENDED_RELEASE_TABLET | Freq: Once | ORAL | Status: AC
  Administered 2022-12-30: 20 meq via ORAL
  Filled 2022-12-30 (×2): qty 2

## 2022-12-30 NOTE — Inpatient Diabetes Management (Signed)
Inpatient Diabetes Program Recommendations  AACE/ADA: New Consensus Statement on Inpatient Glycemic Control (2015)  Target Ranges:  Prepandial:   less than 140 mg/dL      Peak postprandial:   less than 180 mg/dL (1-2 hours)      Critically ill patients:  140 - 180 mg/dL   Lab Results  Component Value Date   GLUCAP 192 (H) 12/30/2022   HGBA1C 14.0 (H) 12/27/2022    Review of Glycemic Control  Latest Reference Range & Units 12/29/22 16:39 12/29/22 21:20 12/30/22 07:47 12/30/22 11:35  Glucose-Capillary 70 - 99 mg/dL 80 161 (H) 71 096 (H)   Diabetes history: DM 2 Glucotrol 10 mg bid Lantus 45 units daily Current orders for Inpatient glycemic control:  Novolog 0-9 units tid with meals and HS Novolog 4 units tid with meals Semglee 40 units daily Inpatient Diabetes Program Recommendations:    Spoke briefly with patient and his family member (who was on the phone).  He states that he was taking his insulin at home but had stopped when he was unable to eat.  Discussed importance of monitoring and keeping blood sugars 100-180 mg/dL daily.  Discussed that current A1C indicates average blood sugars of 360 mg/dL.    Blood sugars have been better in hospital.  Home health will be ordered for diabetes management as well.  Briefly discussed diet including elimination of sugar from beverages, more leafy green veggies, protein, and reduction of CHO.  Patient and family verbalize understanding.  Note d/c delayed until tomorrow.   Thanks,  Beryl Meager, RN, BC-ADM Inpatient Diabetes Coordinator Pager [phone]  (8a-5p)

## 2022-12-30 NOTE — Progress Notes (Signed)
Physical Therapy Treatment Patient Details Name: Cory [name] MRN: [id number] DOB: [birth year] Today's Date: 12/30/2022   History of Present Illness 77 yo male admitted 8/19 with decreased intake, dark urine, weakness and possible UTI. Pt with Aflutter in ED. 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 Pt is able to ambulate into hallway with rollator & supervision. Pt does require cuing for proper hand placement during STS with rollator. PT encouraged pt to call on family, friends, church members, etc, to assist pt with cooking/cleaning at d/c as pt has concerns about this; case manager & team made aware as well. Recommend urinal & BSC for pt to use PRN upon return home.    If plan is discharge home, recommend the following: Assistance with cooking/housework;Assist for transportation   Can travel by private vehicle        Equipment Recommendations  BSC/3in1    Recommendations for Other Services       Precautions / Restrictions Precautions Precautions: Fall;Other (comment) Precaution Comments: Rt BKA w/ prosthetic Restrictions Weight Bearing Restrictions: No     Mobility  Bed Mobility Overal bed mobility: Modified Independent Bed Mobility: Supine to Sit     Supine to sit: Modified independent (Device/Increase time), HOB elevated, Used rails          Transfers Overall transfer level: Needs assistance Equipment used: Rollator (4 wheels) Transfers: Sit to/from Stand Sit to Stand: Supervision           General transfer comment: Pt initially attempts STS with BUE on rollator & posterior LOB onto bed as pt with insufficient strength to stand up. PT educates pt on hand placement during STS transfer with pt able to return demonstrate with success.    Ambulation/Gait Ambulation/Gait assistance: Supervision Gait Distance (Feet): 250  Feet Assistive device: Rollator (4 wheels)   Gait velocity: slightly decreased     General Gait Details: slight forward trunk flexion. PT hears air moving most times when pt steps with RLE prosthetic. Pt with gel liner>beige liner>socks>prosthetic donned with pt reporting this is how he's always worn prosthetic (unsure if pt should wear socks over beige piece?, Encouraged pt to f/u with prosthetist re: noise to ensure prosthetic is fitting properly.)   Stairs             Wheelchair Mobility     Tilt Bed    Modified Rankin (Stroke Patients Only)       Balance Overall balance assessment: Needs assistance Sitting-balance support: No upper extremity supported, Feet supported Sitting balance-Leahy Scale: Good     Standing balance support: During functional activity, Bilateral upper extremity supported, Reliant on assistive device for balance Standing balance-Leahy Scale: Fair                              Cognition Arousal: Alert Behavior During Therapy: WFL for tasks assessed/performed Overall Cognitive Status: Within Functional Limits for tasks assessed                                          Exercises      General Comments        Pertinent Vitals/Pain Pain Assessment Pain Assessment: No/denies pain    Home Living  Prior Function            PT Goals (current goals can now be found in the care plan section) Acute Rehab PT Goals Patient Stated Goal: get better Time For Goal Achievement: 01/03/23 Potential to Achieve Goals: Good Progress towards PT goals: Progressing toward goals    Frequency    Min 1X/week      PT Plan      Co-evaluation              AM-PAC PT "6 Clicks" Mobility   Outcome Measure  Help needed turning from your back to your side while in a flat bed without using bedrails?: None Help needed moving from lying on your back to sitting on the side of a flat  bed without using bedrails?: None Help needed moving to and from a bed to a chair (including a wheelchair)?: A Little Help needed standing up from a chair using your arms (e.g., wheelchair or bedside chair)?: A Little Help needed to walk in hospital room?: A Little Help needed climbing 3-5 steps with a railing? : A Little 6 Click Score: 20    End of Session Equipment Utilized During Treatment:  (RLE prosthetic) Activity Tolerance: Patient tolerated treatment well Patient left: in bed;with nursing/sitter in room   PT Visit Diagnosis: Other abnormalities of gait and mobility (R26.89);Muscle weakness (generalized) (M62.81)     Time: 6578-4696 PT Time Calculation (min) (ACUTE ONLY): 30 min  Charges:    $Therapeutic Activity: 23-37 mins PT General Charges $$ ACUTE PT VISIT: 1 Visit                     Aleda Grana, PT, DPT 12/30/22, 12:15 PM   Sandi Mariscal 12/30/2022, 12:14 PM

## 2022-12-30 NOTE — TOC Transition Note (Signed)
Transition of Care [facility]) - CM/SW Discharge Note   Patient Details  Name: Cory [name] MRN: [id number] Date of Birth: 1945/12/01  Transition of Care [facility]) CM/SW Contact:  Gordy Clement, RN Phone Number: 12/30/2022, 1:40 PM   Clinical Narrative:     CM met Patient bedside to disc ss DC plan.   Their was a family member in the room (stated she was a Niece) on the phone with the other Niece, [name] .  CM was able to speak to Niece [name] about DC plan.      Patient will DC to home (DC order is in but [name] cannot get him and stay with him until tomorrow due to a procedure today and driving restrictions .  Patient has been recommended a BSC . [employer] states that patient had received one in 2022 and insurance won't cover it. Out of pocket cost is $75.00. Patient states he needed to think about the expense as he's had a  lot of expenses lately. [employer] will also try to reach patient and his Niece, Enid Derry  [phone]) to see if they've made a decision.    Patient will have Home Health Skilled Nursing ( for DM management) PT/OT / Aide and SW. Rolene Arbour has provided services in the past and has accepted.   Patient has expressed needing assistance with food prep/ possible light housekeeping etc.  CM educated Patient on Personal Care Services [facility]) and has provided a list of resources for food and PCS services.   Diabetes instructor came into the room . CM  let instructor know that Home Health SN will offer Diabetes Teaching and Management in the home for additional support.   Patient needs to make a decision about the [facility] and whether he will pay out of pocket to [employer], get one locally, or order online   AVS updated            Patient Goals and CMS Choice      Discharge Placement                         Discharge Plan and Services Additional resources added to the After Visit Summary for                                       Social Determinants of Health (SDOH)  Interventions SDOH Screenings   Food Insecurity: Patient Declined (12/27/2022)  Housing: Patient Declined (12/27/2022)  Transportation Needs: Patient Declined (12/27/2022)  Utilities: Patient Declined (12/27/2022)  Social Connections: Unknown (09/21/2021)   Received from [facility]  Tobacco Use: Low Risk  (12/26/2022)     Readmission Risk Interventions     No data to display

## 2022-12-30 NOTE — Plan of Care (Signed)

## 2022-12-30 NOTE — Plan of Care (Signed)

## 2022-12-30 NOTE — Discharge Summary (Signed)
PATIENT DETAILS Name: Cory [name] Age: 77 y.o. Sex: male Date of Birth: 03-01-1946 MRN: [id number]. Admitting Physician: Synetta Fail, MD NWG:NFAOZH, [name], MD  Admit Date: 12/26/2022 Discharge date: 12/31/2022  Recommendations for Outpatient Follow-up:  Follow up with PCP in 1-2 weeks Please obtain CMP/CBC in one week Please ensure follow-up with cardiology-May require cardioversion if he is still in a flutter. ARB/torsemide on hold until AKI resolves  Admitted From:  Home  Disposition: Home   Discharge Condition: good  CODE STATUS:   Code Status: Full Code   Diet recommendation:  Diet Order             Diet - low sodium heart healthy           Diet Carb Modified           Diet heart healthy/carb modified Room service appropriate? Yes; Fluid consistency: Thin  Diet effective now                    Brief Summary: Patient is a 77 y.o.  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  He was brought to the ED on 8/19-found to have acute metabolic encephalopathy, hyperglycemic nonketotic hyperglycemia, AKI, a flutter with RVR and subsequently admitted to the hospitalist service.   Significant events: 8/19>> admit to [facility]   Significant studies: 8/19>> CT head: No acute intracranial abnormality 8/19>> CXR: No active disease   Significant microbiology data: None   Procedures: None   Consults: None  Brief Hospital Course: Acute metabolic encephalopathy Due to AKI/nonketotic hyperglycemia Apparently was confused when he first presented-this has resolved-patient is completely awake and alert.   Hyperglycemic hyperosmolar nonketotic state Required insulin drip on initial presentation Has been transitioned to SQ insulin-see below.   AKI Suspect due to osmotic diuresis due to hyperglycemia/some  hemodynamic mediated injury in the setting of HCTZ/losartan use.  Renal ultrasound without any hydronephrosis. Creatinine continues to improve-treated with supportive care IV fluid hydration. Continue to hold ARB on discharge. PCP repeat electrolytes in 1 week.   New onset atrial flutter with RVR Rate controlled on metoprolol Continue Eliquis  (CHA2DS2-VASc score of at least 4) Echo with stable EF TSH stable Cardiology consult appreciated plans are for outpatient follow-up with cardiology and cardioversion if he is still in a flutter.   Hypokalemia Repleted.   ?  UTI versus asymptomatic bacteriuria No symptom Looks like urine culture was never done No longer on antibiotics-was given 1 dose of Rocephin in the ED-and none since then-and has been relatively stable without any symptoms of UTI or fever.   DM-2 (A1c 14 on 8/20) with uncontrolled hyperglycemia/poor long-term control CBG stable-continue 40 units Lantus daily and follow-up with PCP. Hold glipizide for now until renal function improves further.  Normocytic anemia Chronic issue Mild drop in hemoglobin is likely due to IV fluid dilution No evidence of GI bleeding/blood loss Follow CBC   HTN BP controlled with metoprolol Losartan/HCTZ on hold due to AKI   HLD Statin   BPH Flomax   Debility/deconditioning PT/OT eval   BMI: Estimated body mass index is 27.2 kg/m as calculated from the following:   Height as of this encounter: 5\' 11"  (1.803 m).   Weight as of this encounter: 88.5 kg.      Discharge Diagnoses:  Principal Problem:   AKI (acute kidney  or hydronephrosis visualized. Bladder: Appears normal for degree of bladder distention. Bilateral ureteral jets are seen. Other: None. IMPRESSION: No collecting system dilatation. Electronically Signed   By: Karen Kays M.D.   On: 12/29/2022 17:12   DG Abd 2 Views  Result Date: 12/27/2022 CLINICAL DATA:  Distended abdomen EXAM: ABDOMEN - 2 VIEW COMPARISON:  None Available. FINDINGS: Nonobstructive bowel gas pattern. No organomegaly, free air or suspicious calcification. No acute bony abnormality. IMPRESSION: No acute findings. Electronically Signed   By: Charlett Nose M.D.   On: 12/27/2022 23:31   ECHOCARDIOGRAM COMPLETE  Result Date: 12/27/2022    ECHOCARDIOGRAM REPORT   Patient Name:   Cory [name] Date of Exam: 12/27/2022 Medical Rec #:  [id number]    Height:       71.0 in Accession #:    [id number]   Weight:       195.0 lb Date of Birth:  [birth year]    BSA:          2.086 m Patient Age:    76 years     BP:           118/76 mmHg Patient Gender: M            HR:           77 bpm. Exam Location:  Inpatient Procedure: 2D Echo, Color Doppler and Cardiac Doppler Indications:    I48.92* Unspecified atrial flutter  History:        Patient has no prior history of Echocardiogram examinations.                 Risk Factors:Hypertension, Diabetes and Dyslipidemia.  Sonographer:    Irving Burton Senior RDCS Referring Phys: [id number] VASUNDHRA RATHORE IMPRESSIONS  1. Left ventricular ejection fraction, by estimation, is 60 to 65%. The left ventricle has normal function. The left ventricle has no regional wall motion abnormalities. There is moderate  concentric left ventricular hypertrophy. Left ventricular diastolic function could not be evaluated.  2. Right ventricular systolic function is normal. The right ventricular size is normal. There is normal pulmonary artery systolic pressure. The estimated right ventricular systolic pressure is 28.8 mmHg.  3. The mitral valve is normal in structure. Trivial mitral valve regurgitation. No evidence of mitral stenosis.  4. The aortic valve is tricuspid. Aortic valve regurgitation is not visualized. Aortic valve sclerosis is present, with no evidence of aortic valve stenosis.  5. The inferior vena cava is normal in size with greater than 50% respiratory variability, suggesting right atrial pressure of 3 mmHg. FINDINGS  Left Ventricle: Left ventricular ejection fraction, by estimation, is 60 to 65%. The left ventricle has normal function. The left ventricle has no regional wall motion abnormalities. The left ventricular internal cavity size was normal in size. There is  moderate concentric left ventricular hypertrophy. Left ventricular diastolic function could not be evaluated. Right Ventricle: The right ventricular size is normal. No increase in right ventricular wall thickness. Right ventricular systolic function is normal. There is normal pulmonary artery systolic pressure. The tricuspid regurgitant velocity is 2.54 m/s, and  with an assumed right atrial pressure of 3 mmHg, the estimated right ventricular systolic pressure is 28.8 mmHg. Left Atrium: Left atrial size was normal in size. Right Atrium: Right atrial size was normal in size. Pericardium: There is no evidence of pericardial effusion. Mitral Valve: The mitral valve is normal in structure. Trivial mitral valve regurgitation. No evidence of mitral valve stenosis. Tricuspid Valve: The tricuspid valve is normal in structure.  PATIENT DETAILS Name: Cory [name] Age: 77 y.o. Sex: male Date of Birth: 03-01-1946 MRN: [id number]. Admitting Physician: Synetta Fail, MD NWG:NFAOZH, [name], MD  Admit Date: 12/26/2022 Discharge date: 12/31/2022  Recommendations for Outpatient Follow-up:  Follow up with PCP in 1-2 weeks Please obtain CMP/CBC in one week Please ensure follow-up with cardiology-May require cardioversion if he is still in a flutter. ARB/torsemide on hold until AKI resolves  Admitted From:  Home  Disposition: Home   Discharge Condition: good  CODE STATUS:   Code Status: Full Code   Diet recommendation:  Diet Order             Diet - low sodium heart healthy           Diet Carb Modified           Diet heart healthy/carb modified Room service appropriate? Yes; Fluid consistency: Thin  Diet effective now                    Brief Summary: Patient is a 77 y.o.  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  He was brought to the ED on 8/19-found to have acute metabolic encephalopathy, hyperglycemic nonketotic hyperglycemia, AKI, a flutter with RVR and subsequently admitted to the hospitalist service.   Significant events: 8/19>> admit to [facility]   Significant studies: 8/19>> CT head: No acute intracranial abnormality 8/19>> CXR: No active disease   Significant microbiology data: None   Procedures: None   Consults: None  Brief Hospital Course: Acute metabolic encephalopathy Due to AKI/nonketotic hyperglycemia Apparently was confused when he first presented-this has resolved-patient is completely awake and alert.   Hyperglycemic hyperosmolar nonketotic state Required insulin drip on initial presentation Has been transitioned to SQ insulin-see below.   AKI Suspect due to osmotic diuresis due to hyperglycemia/some  hemodynamic mediated injury in the setting of HCTZ/losartan use.  Renal ultrasound without any hydronephrosis. Creatinine continues to improve-treated with supportive care IV fluid hydration. Continue to hold ARB on discharge. PCP repeat electrolytes in 1 week.   New onset atrial flutter with RVR Rate controlled on metoprolol Continue Eliquis  (CHA2DS2-VASc score of at least 4) Echo with stable EF TSH stable Cardiology consult appreciated plans are for outpatient follow-up with cardiology and cardioversion if he is still in a flutter.   Hypokalemia Repleted.   ?  UTI versus asymptomatic bacteriuria No symptom Looks like urine culture was never done No longer on antibiotics-was given 1 dose of Rocephin in the ED-and none since then-and has been relatively stable without any symptoms of UTI or fever.   DM-2 (A1c 14 on 8/20) with uncontrolled hyperglycemia/poor long-term control CBG stable-continue 40 units Lantus daily and follow-up with PCP. Hold glipizide for now until renal function improves further.  Normocytic anemia Chronic issue Mild drop in hemoglobin is likely due to IV fluid dilution No evidence of GI bleeding/blood loss Follow CBC   HTN BP controlled with metoprolol Losartan/HCTZ on hold due to AKI   HLD Statin   BPH Flomax   Debility/deconditioning PT/OT eval   BMI: Estimated body mass index is 27.2 kg/m as calculated from the following:   Height as of this encounter: 5\' 11"  (1.803 m).   Weight as of this encounter: 88.5 kg.      Discharge Diagnoses:  Principal Problem:   AKI (acute kidney  or hydronephrosis visualized. Bladder: Appears normal for degree of bladder distention. Bilateral ureteral jets are seen. Other: None. IMPRESSION: No collecting system dilatation. Electronically Signed   By: Karen Kays M.D.   On: 12/29/2022 17:12   DG Abd 2 Views  Result Date: 12/27/2022 CLINICAL DATA:  Distended abdomen EXAM: ABDOMEN - 2 VIEW COMPARISON:  None Available. FINDINGS: Nonobstructive bowel gas pattern. No organomegaly, free air or suspicious calcification. No acute bony abnormality. IMPRESSION: No acute findings. Electronically Signed   By: Charlett Nose M.D.   On: 12/27/2022 23:31   ECHOCARDIOGRAM COMPLETE  Result Date: 12/27/2022    ECHOCARDIOGRAM REPORT   Patient Name:   Cory [name] Date of Exam: 12/27/2022 Medical Rec #:  [id number]    Height:       71.0 in Accession #:    [id number]   Weight:       195.0 lb Date of Birth:  [birth year]    BSA:          2.086 m Patient Age:    76 years     BP:           118/76 mmHg Patient Gender: M            HR:           77 bpm. Exam Location:  Inpatient Procedure: 2D Echo, Color Doppler and Cardiac Doppler Indications:    I48.92* Unspecified atrial flutter  History:        Patient has no prior history of Echocardiogram examinations.                 Risk Factors:Hypertension, Diabetes and Dyslipidemia.  Sonographer:    Irving Burton Senior RDCS Referring Phys: [id number] VASUNDHRA RATHORE IMPRESSIONS  1. Left ventricular ejection fraction, by estimation, is 60 to 65%. The left ventricle has normal function. The left ventricle has no regional wall motion abnormalities. There is moderate  concentric left ventricular hypertrophy. Left ventricular diastolic function could not be evaluated.  2. Right ventricular systolic function is normal. The right ventricular size is normal. There is normal pulmonary artery systolic pressure. The estimated right ventricular systolic pressure is 28.8 mmHg.  3. The mitral valve is normal in structure. Trivial mitral valve regurgitation. No evidence of mitral stenosis.  4. The aortic valve is tricuspid. Aortic valve regurgitation is not visualized. Aortic valve sclerosis is present, with no evidence of aortic valve stenosis.  5. The inferior vena cava is normal in size with greater than 50% respiratory variability, suggesting right atrial pressure of 3 mmHg. FINDINGS  Left Ventricle: Left ventricular ejection fraction, by estimation, is 60 to 65%. The left ventricle has normal function. The left ventricle has no regional wall motion abnormalities. The left ventricular internal cavity size was normal in size. There is  moderate concentric left ventricular hypertrophy. Left ventricular diastolic function could not be evaluated. Right Ventricle: The right ventricular size is normal. No increase in right ventricular wall thickness. Right ventricular systolic function is normal. There is normal pulmonary artery systolic pressure. The tricuspid regurgitant velocity is 2.54 m/s, and  with an assumed right atrial pressure of 3 mmHg, the estimated right ventricular systolic pressure is 28.8 mmHg. Left Atrium: Left atrial size was normal in size. Right Atrium: Right atrial size was normal in size. Pericardium: There is no evidence of pericardial effusion. Mitral Valve: The mitral valve is normal in structure. Trivial mitral valve regurgitation. No evidence of mitral valve stenosis. Tricuspid Valve: The tricuspid valve is normal in structure.  or hydronephrosis visualized. Bladder: Appears normal for degree of bladder distention. Bilateral ureteral jets are seen. Other: None. IMPRESSION: No collecting system dilatation. Electronically Signed   By: Karen Kays M.D.   On: 12/29/2022 17:12   DG Abd 2 Views  Result Date: 12/27/2022 CLINICAL DATA:  Distended abdomen EXAM: ABDOMEN - 2 VIEW COMPARISON:  None Available. FINDINGS: Nonobstructive bowel gas pattern. No organomegaly, free air or suspicious calcification. No acute bony abnormality. IMPRESSION: No acute findings. Electronically Signed   By: Charlett Nose M.D.   On: 12/27/2022 23:31   ECHOCARDIOGRAM COMPLETE  Result Date: 12/27/2022    ECHOCARDIOGRAM REPORT   Patient Name:   Cory [name] Date of Exam: 12/27/2022 Medical Rec #:  [id number]    Height:       71.0 in Accession #:    [id number]   Weight:       195.0 lb Date of Birth:  [birth year]    BSA:          2.086 m Patient Age:    76 years     BP:           118/76 mmHg Patient Gender: M            HR:           77 bpm. Exam Location:  Inpatient Procedure: 2D Echo, Color Doppler and Cardiac Doppler Indications:    I48.92* Unspecified atrial flutter  History:        Patient has no prior history of Echocardiogram examinations.                 Risk Factors:Hypertension, Diabetes and Dyslipidemia.  Sonographer:    Irving Burton Senior RDCS Referring Phys: [id number] VASUNDHRA RATHORE IMPRESSIONS  1. Left ventricular ejection fraction, by estimation, is 60 to 65%. The left ventricle has normal function. The left ventricle has no regional wall motion abnormalities. There is moderate  concentric left ventricular hypertrophy. Left ventricular diastolic function could not be evaluated.  2. Right ventricular systolic function is normal. The right ventricular size is normal. There is normal pulmonary artery systolic pressure. The estimated right ventricular systolic pressure is 28.8 mmHg.  3. The mitral valve is normal in structure. Trivial mitral valve regurgitation. No evidence of mitral stenosis.  4. The aortic valve is tricuspid. Aortic valve regurgitation is not visualized. Aortic valve sclerosis is present, with no evidence of aortic valve stenosis.  5. The inferior vena cava is normal in size with greater than 50% respiratory variability, suggesting right atrial pressure of 3 mmHg. FINDINGS  Left Ventricle: Left ventricular ejection fraction, by estimation, is 60 to 65%. The left ventricle has normal function. The left ventricle has no regional wall motion abnormalities. The left ventricular internal cavity size was normal in size. There is  moderate concentric left ventricular hypertrophy. Left ventricular diastolic function could not be evaluated. Right Ventricle: The right ventricular size is normal. No increase in right ventricular wall thickness. Right ventricular systolic function is normal. There is normal pulmonary artery systolic pressure. The tricuspid regurgitant velocity is 2.54 m/s, and  with an assumed right atrial pressure of 3 mmHg, the estimated right ventricular systolic pressure is 28.8 mmHg. Left Atrium: Left atrial size was normal in size. Right Atrium: Right atrial size was normal in size. Pericardium: There is no evidence of pericardial effusion. Mitral Valve: The mitral valve is normal in structure. Trivial mitral valve regurgitation. No evidence of mitral valve stenosis. Tricuspid Valve: The tricuspid valve is normal in structure.  PATIENT DETAILS Name: Cory [name] Age: 77 y.o. Sex: male Date of Birth: 03-01-1946 MRN: [id number]. Admitting Physician: Synetta Fail, MD NWG:NFAOZH, [name], MD  Admit Date: 12/26/2022 Discharge date: 12/31/2022  Recommendations for Outpatient Follow-up:  Follow up with PCP in 1-2 weeks Please obtain CMP/CBC in one week Please ensure follow-up with cardiology-May require cardioversion if he is still in a flutter. ARB/torsemide on hold until AKI resolves  Admitted From:  Home  Disposition: Home   Discharge Condition: good  CODE STATUS:   Code Status: Full Code   Diet recommendation:  Diet Order             Diet - low sodium heart healthy           Diet Carb Modified           Diet heart healthy/carb modified Room service appropriate? Yes; Fluid consistency: Thin  Diet effective now                    Brief Summary: Patient is a 77 y.o.  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  He was brought to the ED on 8/19-found to have acute metabolic encephalopathy, hyperglycemic nonketotic hyperglycemia, AKI, a flutter with RVR and subsequently admitted to the hospitalist service.   Significant events: 8/19>> admit to [facility]   Significant studies: 8/19>> CT head: No acute intracranial abnormality 8/19>> CXR: No active disease   Significant microbiology data: None   Procedures: None   Consults: None  Brief Hospital Course: Acute metabolic encephalopathy Due to AKI/nonketotic hyperglycemia Apparently was confused when he first presented-this has resolved-patient is completely awake and alert.   Hyperglycemic hyperosmolar nonketotic state Required insulin drip on initial presentation Has been transitioned to SQ insulin-see below.   AKI Suspect due to osmotic diuresis due to hyperglycemia/some  hemodynamic mediated injury in the setting of HCTZ/losartan use.  Renal ultrasound without any hydronephrosis. Creatinine continues to improve-treated with supportive care IV fluid hydration. Continue to hold ARB on discharge. PCP repeat electrolytes in 1 week.   New onset atrial flutter with RVR Rate controlled on metoprolol Continue Eliquis  (CHA2DS2-VASc score of at least 4) Echo with stable EF TSH stable Cardiology consult appreciated plans are for outpatient follow-up with cardiology and cardioversion if he is still in a flutter.   Hypokalemia Repleted.   ?  UTI versus asymptomatic bacteriuria No symptom Looks like urine culture was never done No longer on antibiotics-was given 1 dose of Rocephin in the ED-and none since then-and has been relatively stable without any symptoms of UTI or fever.   DM-2 (A1c 14 on 8/20) with uncontrolled hyperglycemia/poor long-term control CBG stable-continue 40 units Lantus daily and follow-up with PCP. Hold glipizide for now until renal function improves further.  Normocytic anemia Chronic issue Mild drop in hemoglobin is likely due to IV fluid dilution No evidence of GI bleeding/blood loss Follow CBC   HTN BP controlled with metoprolol Losartan/HCTZ on hold due to AKI   HLD Statin   BPH Flomax   Debility/deconditioning PT/OT eval   BMI: Estimated body mass index is 27.2 kg/m as calculated from the following:   Height as of this encounter: 5\' 11"  (1.803 m).   Weight as of this encounter: 88.5 kg.      Discharge Diagnoses:  Principal Problem:   AKI (acute kidney  or hydronephrosis visualized. Bladder: Appears normal for degree of bladder distention. Bilateral ureteral jets are seen. Other: None. IMPRESSION: No collecting system dilatation. Electronically Signed   By: Karen Kays M.D.   On: 12/29/2022 17:12   DG Abd 2 Views  Result Date: 12/27/2022 CLINICAL DATA:  Distended abdomen EXAM: ABDOMEN - 2 VIEW COMPARISON:  None Available. FINDINGS: Nonobstructive bowel gas pattern. No organomegaly, free air or suspicious calcification. No acute bony abnormality. IMPRESSION: No acute findings. Electronically Signed   By: Charlett Nose M.D.   On: 12/27/2022 23:31   ECHOCARDIOGRAM COMPLETE  Result Date: 12/27/2022    ECHOCARDIOGRAM REPORT   Patient Name:   Cory [name] Date of Exam: 12/27/2022 Medical Rec #:  [id number]    Height:       71.0 in Accession #:    [id number]   Weight:       195.0 lb Date of Birth:  [birth year]    BSA:          2.086 m Patient Age:    76 years     BP:           118/76 mmHg Patient Gender: M            HR:           77 bpm. Exam Location:  Inpatient Procedure: 2D Echo, Color Doppler and Cardiac Doppler Indications:    I48.92* Unspecified atrial flutter  History:        Patient has no prior history of Echocardiogram examinations.                 Risk Factors:Hypertension, Diabetes and Dyslipidemia.  Sonographer:    Irving Burton Senior RDCS Referring Phys: [id number] VASUNDHRA RATHORE IMPRESSIONS  1. Left ventricular ejection fraction, by estimation, is 60 to 65%. The left ventricle has normal function. The left ventricle has no regional wall motion abnormalities. There is moderate  concentric left ventricular hypertrophy. Left ventricular diastolic function could not be evaluated.  2. Right ventricular systolic function is normal. The right ventricular size is normal. There is normal pulmonary artery systolic pressure. The estimated right ventricular systolic pressure is 28.8 mmHg.  3. The mitral valve is normal in structure. Trivial mitral valve regurgitation. No evidence of mitral stenosis.  4. The aortic valve is tricuspid. Aortic valve regurgitation is not visualized. Aortic valve sclerosis is present, with no evidence of aortic valve stenosis.  5. The inferior vena cava is normal in size with greater than 50% respiratory variability, suggesting right atrial pressure of 3 mmHg. FINDINGS  Left Ventricle: Left ventricular ejection fraction, by estimation, is 60 to 65%. The left ventricle has normal function. The left ventricle has no regional wall motion abnormalities. The left ventricular internal cavity size was normal in size. There is  moderate concentric left ventricular hypertrophy. Left ventricular diastolic function could not be evaluated. Right Ventricle: The right ventricular size is normal. No increase in right ventricular wall thickness. Right ventricular systolic function is normal. There is normal pulmonary artery systolic pressure. The tricuspid regurgitant velocity is 2.54 m/s, and  with an assumed right atrial pressure of 3 mmHg, the estimated right ventricular systolic pressure is 28.8 mmHg. Left Atrium: Left atrial size was normal in size. Right Atrium: Right atrial size was normal in size. Pericardium: There is no evidence of pericardial effusion. Mitral Valve: The mitral valve is normal in structure. Trivial mitral valve regurgitation. No evidence of mitral valve stenosis. Tricuspid Valve: The tricuspid valve is normal in structure.

## 2022-12-31 ENCOUNTER — Other Ambulatory Visit (HOSPITAL_COMMUNITY): Payer: Self-pay

## 2022-12-31 DIAGNOSIS — N179 Acute kidney failure, unspecified: Secondary | ICD-10-CM | POA: Diagnosis not present

## 2022-12-31 LAB — BASIC METABOLIC PANEL
Anion gap: 9 (ref 5–15)
BUN: 16 mg/dL (ref 8–23)
CO2: 20 mmol/L — ABNORMAL LOW (ref 22–32)
Calcium: 8.1 mg/dL — ABNORMAL LOW (ref 8.9–10.3)
Chloride: 104 mmol/L (ref 98–111)
Creatinine, Ser: 1.98 mg/dL — ABNORMAL HIGH (ref 0.61–1.24)
GFR, Estimated: 35 mL/min — ABNORMAL LOW (ref >60)
Glucose, Bld: 111 mg/dL — ABNORMAL HIGH (ref 70–99)
Potassium: 3.6 mmol/L (ref 3.5–5.1)
Sodium: 133 mmol/L — ABNORMAL LOW (ref 135–145)

## 2022-12-31 LAB — GLUCOSE, CAPILLARY: Glucose-Capillary: 110 mg/dL — ABNORMAL HIGH (ref 70–99)

## 2022-12-31 NOTE — Plan of Care (Signed)

## 2022-12-31 NOTE — TOC Transition Note (Signed)
Transition of Care [facility]) - CM/SW Discharge Note   Patient Details  Name: Cory [name] MRN: [id number] Date of Birth: 10-15-1945  Transition of Care [facility]) CM/SW Contact:  Lawerance Sabal, RN Phone Number: 12/31/2022, 7:58 AM   Clinical Narrative:     Notified HH liaison of DC today.  Unable to reach patient on room phone or cell to confirm he does not want 3/1.  No other TOC needs identified for DC.   Final next level of care: Home w Home Health Services Barriers to Discharge: No Barriers Identified   Patient Goals and CMS Choice      Discharge Placement                         Discharge Plan and Services Additional resources added to the After Visit Summary for                              [facility] Agency: [facility] Date [facility] Agency Contacted: 12/31/22 Time HH Agency Contacted: [phone] Representative spoke with at [facility] Agency: Haywood Lasso  Social Determinants of Health (SDOH) Interventions SDOH Screenings   Food Insecurity: Patient Declined (12/27/2022)  Housing: Patient Declined (12/27/2022)  Transportation Needs: Patient Declined (12/27/2022)  Utilities: Patient Declined (12/27/2022)  Social Connections: Unknown (09/21/2021)   Received from [facility]  Tobacco Use: Low Risk  (12/26/2022)     Readmission Risk Interventions     No data to display

## 2022-12-31 NOTE — Progress Notes (Signed)
Subjective:  No complaints-sitting up and eating breakfast  Objective: Blood pressure (!) 144/99, pulse 86, temperature 98.4 F (36.9 C), temperature source Oral, resp. rate (!) 24, height 5\' 11"  (1.803 m), weight 88.5 kg, SpO2 95%.  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  Assessment and plan A flutter rate controlled-continue metoprolol/Eliquis Acute metabolic encephalopathy-resolved Hyperosmolar hyperglycemic nonketotic state-resolved Mild resolving AKI-unknown baseline-unsure if he has some mild CKD at baseline-hold diuretics/ARB  Remains stable for discharge-Per TOC team-family to come get patient today, they were not able to get him yesterday.  Discussed with patient-he is aware that he will need to follow-up with his primary cardiologist/primary care practitioner in 1 week for posthospital discharge visit, hopefully labs will be drawn then.

## 2023-02-07 ENCOUNTER — Other Ambulatory Visit (HOSPITAL_COMMUNITY): Payer: Self-pay

## 2023-02-16 ENCOUNTER — Encounter (HOSPITAL_COMMUNITY): Payer: Self-pay | Admitting: Internal Medicine

## 2023-02-16 ENCOUNTER — Emergency Department (HOSPITAL_COMMUNITY): Payer: Medicare HMO

## 2023-02-16 ENCOUNTER — Observation Stay (HOSPITAL_COMMUNITY): Payer: Medicare HMO

## 2023-02-16 ENCOUNTER — Inpatient Hospital Stay (HOSPITAL_COMMUNITY)
Admission: EM | Admit: 2023-02-16 | Discharge: 2023-02-19 | DRG: 683 | Disposition: A | Discharge status: Home Health | Payer: Medicare HMO | Attending: Internal Medicine | Admitting: Internal Medicine

## 2023-02-16 ENCOUNTER — Other Ambulatory Visit: Payer: Self-pay

## 2023-02-16 DIAGNOSIS — M7989 Other specified soft tissue disorders: Secondary | ICD-10-CM | POA: Diagnosis present

## 2023-02-16 DIAGNOSIS — I130 Hypertensive heart and chronic kidney disease with heart failure and stage 1 through stage 4 chronic kidney disease, or unspecified chronic kidney disease: Secondary | ICD-10-CM | POA: Diagnosis present

## 2023-02-16 DIAGNOSIS — R42 Dizziness and giddiness: Secondary | ICD-10-CM | POA: Diagnosis not present

## 2023-02-16 DIAGNOSIS — E860 Dehydration: Secondary | ICD-10-CM | POA: Diagnosis present

## 2023-02-16 DIAGNOSIS — K649 Unspecified hemorrhoids: Secondary | ICD-10-CM | POA: Diagnosis present

## 2023-02-16 DIAGNOSIS — R55 Syncope and collapse: Secondary | ICD-10-CM | POA: Diagnosis not present

## 2023-02-16 DIAGNOSIS — I5032 Chronic diastolic (congestive) heart failure: Secondary | ICD-10-CM | POA: Diagnosis present

## 2023-02-16 DIAGNOSIS — E1165 Type 2 diabetes mellitus with hyperglycemia: Secondary | ICD-10-CM | POA: Diagnosis present

## 2023-02-16 DIAGNOSIS — Z7901 Long term (current) use of anticoagulants: Secondary | ICD-10-CM

## 2023-02-16 DIAGNOSIS — E876 Hypokalemia: Secondary | ICD-10-CM | POA: Diagnosis present

## 2023-02-16 DIAGNOSIS — E785 Hyperlipidemia, unspecified: Secondary | ICD-10-CM | POA: Diagnosis present

## 2023-02-16 DIAGNOSIS — N1832 Chronic kidney disease, stage 3b: Secondary | ICD-10-CM | POA: Diagnosis present

## 2023-02-16 DIAGNOSIS — I4819 Other persistent atrial fibrillation: Secondary | ICD-10-CM | POA: Diagnosis present

## 2023-02-16 DIAGNOSIS — N400 Enlarged prostate without lower urinary tract symptoms: Secondary | ICD-10-CM | POA: Diagnosis present

## 2023-02-16 DIAGNOSIS — N179 Acute kidney failure, unspecified: Principal | ICD-10-CM | POA: Diagnosis present

## 2023-02-16 DIAGNOSIS — I951 Orthostatic hypotension: Secondary | ICD-10-CM | POA: Diagnosis present

## 2023-02-16 DIAGNOSIS — Z794 Long term (current) use of insulin: Secondary | ICD-10-CM

## 2023-02-16 DIAGNOSIS — E1122 Type 2 diabetes mellitus with diabetic chronic kidney disease: Secondary | ICD-10-CM | POA: Diagnosis present

## 2023-02-16 DIAGNOSIS — Z89511 Acquired absence of right leg below knee: Secondary | ICD-10-CM

## 2023-02-16 DIAGNOSIS — Z833 Family history of diabetes mellitus: Secondary | ICD-10-CM

## 2023-02-16 DIAGNOSIS — Z79899 Other long term (current) drug therapy: Secondary | ICD-10-CM

## 2023-02-16 DIAGNOSIS — I4892 Unspecified atrial flutter: Secondary | ICD-10-CM | POA: Diagnosis present

## 2023-02-16 LAB — I-STAT CHEM 8, ED
BUN: 28 mg/dL — ABNORMAL HIGH (ref 8–23)
Calcium, Ion: 1.09 mmol/L — ABNORMAL LOW (ref 1.15–1.40)
Chloride: 93 mmol/L — ABNORMAL LOW (ref 98–111)
Creatinine, Ser: 2.6 mg/dL — ABNORMAL HIGH (ref 0.61–1.24)
Glucose, Bld: 149 mg/dL — ABNORMAL HIGH (ref 70–99)
HCT: 43 % (ref 39.0–52.0)
Hemoglobin: 14.6 g/dL (ref 13.0–17.0)
Potassium: 2.7 mmol/L — CL (ref 3.5–5.1)
Sodium: 136 mmol/L (ref 135–145)
TCO2: 32 mmol/L (ref 22–32)

## 2023-02-16 LAB — URINALYSIS, ROUTINE W REFLEX MICROSCOPIC
Bilirubin Urine: NEGATIVE
Glucose, UA: NEGATIVE mg/dL
Ketones, ur: NEGATIVE mg/dL
Nitrite: NEGATIVE
Protein, ur: 30 mg/dL — AB
Specific Gravity, Urine: 1.01 (ref 1.005–1.030)
pH: 6 (ref 5.0–8.0)

## 2023-02-16 LAB — DIFFERENTIAL
Abs Immature Granulocytes: 0.02 10*3/uL (ref 0.00–0.07)
Basophils Absolute: 0 10*3/uL (ref 0.0–0.1)
Basophils Relative: 0 %
Eosinophils Absolute: 0.1 10*3/uL (ref 0.0–0.5)
Eosinophils Relative: 2 %
Immature Granulocytes: 0 %
Lymphocytes Relative: 31 %
Lymphs Abs: 1.6 10*3/uL (ref 0.7–4.0)
Monocytes Absolute: 0.7 10*3/uL (ref 0.1–1.0)
Monocytes Relative: 14 %
Neutro Abs: 2.7 10*3/uL (ref 1.7–7.7)
Neutrophils Relative %: 53 %

## 2023-02-16 LAB — ETHANOL: Alcohol, Ethyl (B): <10 mg/dL (ref <10)

## 2023-02-16 LAB — RAPID URINE DRUG SCREEN, HOSP PERFORMED
Amphetamines: NOT DETECTED
Barbiturates: NOT DETECTED
Benzodiazepines: NOT DETECTED
Cocaine: NOT DETECTED
Opiates: NOT DETECTED
Tetrahydrocannabinol: NOT DETECTED

## 2023-02-16 LAB — URINALYSIS, MICROSCOPIC (REFLEX)

## 2023-02-16 LAB — MAGNESIUM: Magnesium: 2 mg/dL (ref 1.7–2.4)

## 2023-02-16 LAB — CBC
HCT: 39 % (ref 39.0–52.0)
Hemoglobin: 12.6 g/dL — ABNORMAL LOW (ref 13.0–17.0)
MCH: 26.1 pg (ref 26.0–34.0)
MCHC: 32.3 g/dL (ref 30.0–36.0)
MCV: 80.7 fL (ref 80.0–100.0)
Platelets: 229 10*3/uL (ref 150–400)
RBC: 4.83 MIL/uL (ref 4.22–5.81)
RDW: 14.2 % (ref 11.5–15.5)
WBC: 5.2 10*3/uL (ref 4.0–10.5)
nRBC: 0 % (ref 0.0–0.2)

## 2023-02-16 LAB — APTT: aPTT: 29 s (ref 24–36)

## 2023-02-16 LAB — COMPREHENSIVE METABOLIC PANEL
ALT: 14 U/L (ref 0–44)
AST: 18 U/L (ref 15–41)
Albumin: 3.3 g/dL — ABNORMAL LOW (ref 3.5–5.0)
Alkaline Phosphatase: 74 U/L (ref 38–126)
Anion gap: 14 (ref 5–15)
BUN: 24 mg/dL — ABNORMAL HIGH (ref 8–23)
CO2: 31 mmol/L (ref 22–32)
Calcium: 9.4 mg/dL (ref 8.9–10.3)
Chloride: 92 mmol/L — ABNORMAL LOW (ref 98–111)
Creatinine, Ser: 2.44 mg/dL — ABNORMAL HIGH (ref 0.61–1.24)
GFR, Estimated: 27 mL/min — ABNORMAL LOW (ref >60)
Glucose, Bld: 149 mg/dL — ABNORMAL HIGH (ref 70–99)
Potassium: 2.8 mmol/L — ABNORMAL LOW (ref 3.5–5.1)
Sodium: 137 mmol/L (ref 135–145)
Total Bilirubin: 0.5 mg/dL (ref 0.3–1.2)
Total Protein: 8 g/dL (ref 6.5–8.1)

## 2023-02-16 LAB — PROTIME-INR
INR: 1.1 (ref 0.8–1.2)
Prothrombin Time: 14.3 s (ref 11.4–15.2)

## 2023-02-16 LAB — PHOSPHORUS: Phosphorus: 3.4 mg/dL (ref 2.5–4.6)

## 2023-02-16 LAB — GLUCOSE, CAPILLARY: Glucose-Capillary: 145 mg/dL — ABNORMAL HIGH (ref 70–99)

## 2023-02-16 MED ORDER — GADOBUTROL 1 MMOL/ML IV SOLN
9.0000 mL | Freq: Once | INTRAVENOUS | Status: AC | PRN
  Administered 2023-02-16: 9 mL via INTRAVENOUS

## 2023-02-16 MED ORDER — APIXABAN 5 MG PO TABS
5.0000 mg | ORAL_TABLET | Freq: Two times a day (BID) | ORAL | Status: DC
  Administered 2023-02-16 – 2023-02-19 (×6): 5 mg via ORAL
  Filled 2023-02-16 (×6): qty 1

## 2023-02-16 MED ORDER — TAMSULOSIN HCL 0.4 MG PO CAPS
0.4000 mg | ORAL_CAPSULE | Freq: Every day | ORAL | Status: DC
  Administered 2023-02-17 – 2023-02-19 (×3): 0.4 mg via ORAL
  Filled 2023-02-16 (×3): qty 1

## 2023-02-16 MED ORDER — INSULIN ASPART 100 UNIT/ML IJ SOLN: 0.0000 [IU] | Freq: Every day | INTRAMUSCULAR | Status: DC

## 2023-02-16 MED ORDER — INSULIN GLARGINE-YFGN 100 UNIT/ML ~~LOC~~ SOLN
20.0000 [IU] | Freq: Every day | SUBCUTANEOUS | Status: DC
  Administered 2023-02-16: 20 [IU] via SUBCUTANEOUS
  Filled 2023-02-16 (×4): qty 0.2

## 2023-02-16 MED ORDER — ACETAMINOPHEN 325 MG PO TABS: 650.0000 mg | ORAL_TABLET | Freq: Four times a day (QID) | ORAL | Status: DC | PRN

## 2023-02-16 MED ORDER — POTASSIUM CHLORIDE 10 MEQ/100ML IV SOLN: 10.0000 meq | INTRAVENOUS | Status: AC

## 2023-02-16 MED ORDER — ACETAMINOPHEN 650 MG RE SUPP: 650.0000 mg | Freq: Four times a day (QID) | RECTAL | Status: DC | PRN

## 2023-02-16 MED ORDER — HYDRALAZINE HCL 20 MG/ML IJ SOLN
10.0000 mg | Freq: Four times a day (QID) | INTRAMUSCULAR | Status: DC | PRN
  Administered 2023-02-16: 10 mg via INTRAVENOUS
  Filled 2023-02-16: qty 1

## 2023-02-16 MED ORDER — INSULIN ASPART 100 UNIT/ML IJ SOLN
0.0000 [IU] | Freq: Three times a day (TID) | INTRAMUSCULAR | Status: DC
  Administered 2023-02-17: 5 [IU] via SUBCUTANEOUS

## 2023-02-16 MED ORDER — ALBUTEROL SULFATE (2.5 MG/3ML) 0.083% IN NEBU: 2.5000 mg | INHALATION_SOLUTION | Freq: Four times a day (QID) | RESPIRATORY_TRACT | Status: DC | PRN

## 2023-02-16 MED ORDER — METOPROLOL TARTRATE 25 MG PO TABS
25.0000 mg | ORAL_TABLET | Freq: Two times a day (BID) | ORAL | Status: DC
  Administered 2023-02-16 – 2023-02-19 (×6): 25 mg via ORAL
  Filled 2023-02-16 (×6): qty 1

## 2023-02-16 MED ORDER — ATORVASTATIN CALCIUM 10 MG PO TABS
20.0000 mg | ORAL_TABLET | Freq: Every day | ORAL | Status: DC
  Administered 2023-02-16 – 2023-02-18 (×3): 20 mg via ORAL
  Filled 2023-02-16 (×3): qty 2

## 2023-02-16 NOTE — ED Triage Notes (Signed)
Pt BIB EMS from home for dizziness, left arm heaviness, and near syncope. Pt lowered self to ground form standing and denies any trauma to head. Pt denies CP, SOB and left arm heaviness now. Pt endorses bright red blood in stool and states he has a follow up appointment next week.Pt reports history of afib, but shows a-flutter on monitor.  A&Ox4.

## 2023-02-16 NOTE — H&P (Signed)
[facility] History and Physical  Cory [name] QIO:[id number] DOB: May 31, 1945 DOA: 02/16/2023 PCP: Frederich Chick., MD  Presented from: Home Chief Complaint: Dizziness, left arm heaviness, near syncope  History of Present Illness: Cory [name] is a 77 y.o. male with PMH significant for uncontrolled diabetes, HTN, HLD, A-fib on Eliquis, CKD, R foot osteomyelitis s/p BKA 2022, hemorrhoids, BPH Patient lives at home alone, has multiple friends and family members checking on him every day. Uses a walker to get around.  No cognitive impairment at baseline. Follows up at [facility].  Patient was using bathroom sink this morning when he felt sudden dizziness, left arm heaviness and a vague uneasy sensation.  He lowered himself to the ground to avoid a fall.  He did not pass out.  He called his niece and EMS.  He crawled to his room and sat on the chair by the time of EMS arrival. Reports that EMS noted his blood pressure and blood sugar were okay. No nausea, vomiting, diarrhea, fever No history of orthostatic hypotension Reports compliance to all medications including insulin.  He is also on torsemide and metolazone. Does not smoke, drink or use drugs  In the ED, patient was afebrile, heart rate in 50s, blood pressure 167/94, breathing on room air His symptoms had subsided by the time of presentation to the ED Labs with WC count 5.2, hemoglobin 12.6, platelet 229, sodium 137, potassium 2.8, BUN/creatinine 24/2.44 CT head did not show any acute intracranial abnormality.  It showed mild chronic vessel ischemic disease. EKG showed a flutter with 4-1 block, ventricular rate 65 bpm, QTc 451 ms Urinalysis sent.  No result yet  Patient was given IV potassium supplement Hospitalist service was consulted for further evaluation management.  At the time of my evaluation, patient was propped up in bed. Alert, awake, oriented x 3.  Seems to have intact memory, able to give the  details of history.  History revised and detailed as above.  Review of Systems:  All systems were reviewed and were negative unless otherwise mentioned in the HPI   Past medical history: Past Medical History:  Diagnosis Date   Diabetes mellitus without complication (HCC)    Hyperlipidemia    Hypertension    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   Sepsis (HCC) 11/27/2020    Past surgical history: Past Surgical History:  Procedure Laterality Date   AMPUTATION Right 12/04/2020   Procedure: RIGHT BELOW KNEE AMPUTATION;  Surgeon: Nadara Mustard, MD;  Location: [facility] OR;  Service: Orthopedics;  Laterality: Right;   APPENDECTOMY     APPLICATION OF WOUND VAC Right 12/04/2020   Procedure: APPLICATION OF WOUND VAC;  Surgeon: Nadara Mustard, MD;  Location: [facility] OR;  Service: Orthopedics;  Laterality: Right;    Social History:  reports that he has never smoked. He has never used smokeless tobacco. He reports that he does not drink alcohol and does not use drugs.  Allergies:  No Known Allergies Patient has no known allergies.   Family history:  Family History  Problem Relation Age of Onset   Diabetes Mother    Diabetes Father      Physical Exam: Vitals:   02/16/23 1247 02/16/23 1248 02/16/23 1530 02/16/23 1545  BP:   (!) 159/103   Pulse:   65   Resp:      Temp:      TempSrc:      SpO2:  98%  97%  Weight: 88.5 kg  Height: 5\' 11"  (1.803 m)      Wt Readings from Last 3 Encounters:  02/16/23 88.5 kg  12/26/22 88.5 kg  11/07/22 95.3 kg   Body mass index is 27.2 kg/m.  General exam: Pleasant, elderly African-American male. Skin: No rashes, lesions or ulcers. HEENT: Atraumatic, normocephalic, no obvious bleeding Lungs: Clear to auscultation bilaterally CVS: Irregular rhythm, rate consistent in 60s and 70s, no murmur GI/Abd soft, nontender, nondistended, bowel sound present CNS: Alert, awake, oriented x 3 Psychiatry: Mood appropriate Extremities: No pedal edema, no  calf tenderness  ------------------------------------------------------------------------------------------------------ Assessment/Plan: Principal Problem:   Syncope  Acute neurological symptoms Presented with sudden onset dizziness, left arm heaviness and uneasy sensation No history of CVA or orthostatic hypotension However has significant risk factors including uncontrolled diabetes, hypertension, hyperlipidemia and A-fib CT head without any acute abnormality. I will obtain MRI brain to rule out posterior circulation stroke. Patient is already on Eliquis and statin for maximum stroke prophylaxis. If MRI brain is positive, consider neurology consultation.  Near syncope H/o CHF, hypertension AKI on CKD 3B Last echocardiogram from August 2024 with normal EF 60 to 65%, normal right ventricular pressures. But patient reports he is on torsemide and metolazone daily. dehydration, AKI and possible near syncope could be to diuretics. Baseline creatinine 1.84 from August 2024.  Presented with creatinine elevated to 2.44 May benefit from fluid.  Start NS at 75 mL/h for next 24 hours Obtain echocardiogram and carotid duplex Repeat labs in a.m. Recent Labs    12/26/22 1157 12/27/22 0130 12/28/22 0035 12/29/22 0256 12/30/22 0641 12/31/22 0448 02/16/23 1329 02/16/23 1429  BUN 49* 34* 30* 21 14 16  24* 28*  CREATININE 2.44* 2.21* 2.15* 2.04* 1.84* 1.98* 2.44* 2.60*   Persistent A-fib/flutter Currently in 4:1 block with heart rate in 60s PTA meds- Metoprolol tartrate 50 mg twice daily and Eliquis 5 mg twice daily Continue both  Type 2 diabetes mellitus A1c 14 on August 2024 PTA meds-Lantus 40 units nightly Blood sugar level 149 this afternoon. Resume Lantus at 20 units tonight.  Start SSI/Accu-Cheks  Hyperlipidemia Statin  Hemorrhoidal bleeding Intermittent.  Follows with GI at [facility].  Has an appointment in next 1 week.  BPH Flomax  Home med list has not been  obtained/updated by [facility] at the time of admission.  Please double check admission reconciliation.   A-fib on Eliquis, R foot osteomyelitis s/p BKA 2022, hemorrhoids, BPH Patient lives at home alone, has multiple friends and family members checking on him every day. Uses a walker to get around.  No cognitive impairment at baseline. Follow[facility] at [facility].  Patient was using bathroom sink this morning when he felt sudden dizziness, left arm heaviness and a vague uneasy sensation.  He lowered himself to the ground to avoid a fall.  He did not pass out.  He called his niece and EMS.  He crawled to his room and sat on the chair by the time of EMS arrival. Reports that EMS noted his blood pressure and blood sugar were okay. No nausea, vomiting, diarrhea, fever No history of orthostatic hypotension Reports compliance to all medications including insulin.  He is also on torsemide and metolazone. Does not smoke, drink or use drugs  In the ED, patient was afebrile, heart rate in 50s, blood pressure 167/94, breathing on room air His symptoms had subsided by the time of presentation to the ED Labs with WC count 5.2, hemoglobin 12.6, platelet 229, sodium 137, potassium 2.8, BUN/creatinine 24/2.44 CT head  did not show any acute intracranial abnormality.  It showed mild chronic vessel ischemic disease. EKG showed a flutter with 4-1 block, ventricular rate 65 bpm, QTc 451 ms Urinalysis sent.  No result yet  Patient was given IV potassium supplement Hospitalist service was consulted for further evaluation management.  At the time of my evaluation, patient was propped up in bed. Alert, awake, oriented x 3.  Seems to have intact memory, able to give the details of history.  History revised and detailed as above.   Mobility: PT eval ordered  Goals of care   Code Status: Full Code    DVT prophylaxis: Eliquis to continue  apixaban (ELIQUIS) tablet 5 mg   Antimicrobials: None Fluid:  NS at 75 mL/h Consultants: None Family Communication: None at bedside  Dispo: The patient is from: Home              Anticipated d/c is to: Home hopefully  Diet: Diet Order             Diet heart healthy/carb modified Room service appropriate? Yes; Fluid consistency: Thin  Diet effective now                    ------------------------------------------------------------------------------------- Severity of Illness: The appropriate patient status for this patient is OBSERVATION. Observation status is judged to be reasonable and necessary in order to provide the required intensity of service to ensure the patient's safety. The patient's presenting symptoms, physical exam findings, and initial radiographic and laboratory data in the context of their medical condition is felt to place them at decreased risk for further clinical deterioration. Furthermore, it is anticipated that the patient will be medically stable for discharge from the hospital within 2 midnights of admission.  -------------------------------------------------------------------------------------  Home Meds: Prior to Admission medications   Medication Sig Start Date End Date Taking? Authorizing Provider  Accu-Chek Softclix Lancets lancets USE AS directed to test blood sugar daily 11/02/20     acetaminophen (TYLENOL) 325 MG tablet Take 2 tablets (650 mg total) by mouth every 6 (six) hours as needed for mild pain, headache or fever (or Fever >/= 101). 12/07/20   Almon Hercules, MD  apixaban (ELIQUIS) 5 MG TABS tablet Take 1 tablet (5 mg total) by mouth 2 (two) times daily. 12/30/22   Ghimire, Werner Lean, MD  atorvastatin (LIPITOR) 20 MG tablet Take 20 mg by mouth at bedtime. 09/14/20   [provider]  Blood Glucose Monitoring Suppl (BLOOD GLUCOSE MONITOR SYSTEM) w/Device KIT 1 (one) Kit as directed 11/02/20     glucose blood test strip use as directed to test blood sugar daily 11/02/20     Insulin Pen Needle (B-D UF III  MINI PEN NEEDLES) 31G X 5 MM MISC 1 (one) Each daily 11/02/20     LANTUS SOLOSTAR 100 UNIT/ML Solostar Pen Inject 40 Units into the skin daily. 12/30/22   Ghimire, Werner Lean, MD  metoprolol tartrate (LOPRESSOR) 50 MG tablet Take 1 tablet (50 mg total) by mouth 2 (two) times daily. 12/30/22   Ghimire, Werner Lean, MD  sertraline (ZOLOFT) 50 MG tablet Take 50 mg by mouth daily. Patient not taking: Reported on 12/27/2022 07/18/22   [provider]  tamsulosin (FLOMAX) 0.4 MG CAPS capsule Take 1 capsule (0.4 mg total) by mouth daily. 12/07/20   Almon Hercules, MD  Vitamin D, Ergocalciferol, (DRISDOL) 1.25 MG (50000 UNIT) CAPS capsule Take 50,000 Units by mouth once a week. 11/18/22   [provider]  furosemide (LASIX) 20 MG tablet Take 1-2 tablets in the morning for your ankle and foot swelling. Patient not taking: No sig reported 11/12/20 11/27/20  Mardella Layman, MD    Labs on Admission:   CBC: Recent Labs  Lab 02/16/23 1329 02/16/23 1429  WBC 5.2  --   NEUTROABS 2.7  --   HGB 12.6* 14.6  HCT 39.0 43.0  MCV 80.7  --   PLT 229  --     Basic Metabolic Panel: Recent Labs  Lab 02/16/23 1329 02/16/23 1429  NA 137 136  K 2.8* 2.7*  CL 92* 93*  CO2 31  --   GLUCOSE 149* 149*  BUN 24* 28*  CREATININE 2.44* 2.60*  CALCIUM 9.4  --     Liver Function Tests: Recent Labs  Lab 02/16/23 1329  AST 18  ALT 14  ALKPHOS 74  BILITOT 0.5  PROT 8.0  ALBUMIN 3.3*   No results for input(s): "LIPASE", "AMYLASE" in the last 168 hours. No results for input(s): "AMMONIA" in the last 168 hours.  Cardiac Enzymes: No results for input(s): "CKTOTAL", "CKMB", "CKMBINDEX", "TROPONINI" in the last 168 hours.  BNP (last 3 results) No results for input(s): "BNP" in the last 8760 hours.  ProBNP (last 3 results) No results for input(s): "PROBNP" in the last 8760 hours.  CBG: No results for input(s): "GLUCAP" in the last 168 hours.  Lipase     Component Value Date/Time   LIPASE 27  12/27/2022 0130     Urinalysis    Component Value Date/Time   COLORURINE YELLOW 12/26/2022 1530   APPEARANCEUR HAZY (A) 12/26/2022 1530   LABSPEC 1.016 12/26/2022 1530   PHURINE 5.5 12/26/2022 1530   GLUCOSEU >1,000 (A) 12/26/2022 1530   HGBUR MODERATE (A) 12/26/2022 1530   BILIRUBINUR NEGATIVE 12/26/2022 1530   KETONESUR NEGATIVE 12/26/2022 1530   PROTEINUR 30 (A) 12/26/2022 1530   NITRITE NEGATIVE 12/26/2022 1530   LEUKOCYTESUR MODERATE (A) 12/26/2022 1530     Drugs of Abuse  No results found for: "LABOPIA", "COCAINSCRNUR", "LABBENZ", "AMPHETMU", "THCU", "LABBARB"    Radiological Exams on Admission: CT HEAD WO CONTRAST  Result Date: 02/16/2023 CLINICAL DATA:  Transient ischemic attack (TIA) Dizziness.  Near syncope. EXAM: CT HEAD WITHOUT CONTRAST TECHNIQUE: Contiguous axial images were obtained from the base of the skull through the vertex without intravenous contrast. RADIATION DOSE REDUCTION: This exam was performed according to the departmental dose-optimization program which includes automated exposure control, adjustment of the mA and/or kV according to patient size and/or use of iterative reconstruction technique. COMPARISON:  Head CT 12/24/2022 FINDINGS: Brain: No intracranial hemorrhage, mass effect, or midline shift. No hydrocephalus. The basilar cisterns are patent. Stable chronic small vessel ischemic change. 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 No evidence of territorial infarct or acute ischemia. No extra-axial or intracranial fluid collection. Vascular: Atherosclerosis of skullbase vasculature without hyperdense vessel or abnormal calcification. Skull: No fracture or focal lesion. Sinuses/Orbits: No acute findings. Minor mucosal thickening. Bilateral lens resection. Other: None. IMPRESSION: 1. No acute intracranial abnormality. 2. Stable chronic small vessel ischemic change. Electronically Signed   By: Narda Rutherford M.D.   On: 02/16/2023  15:47     Signed, Lorin Glass, MD [facility] 02/16/2023

## 2023-02-16 NOTE — ED Provider Notes (Signed)
Emergency Department Provider Note   I have reviewed the triage vital signs and the nursing notes.   HISTORY  Chief Complaint Near Syncope   HPI Cory [name] is a 77 y.o. male has history of diabetes, hypertension, hyperlipidemia presents to the emergency department for evaluation of near syncope.  Patient states he was standing at the sink when he suddenly felt weak.  The weakness seemed to be localized in his left leg with some associated weakness and numbness in his left arm.  He did not appreciate any speech changes but called his daughter, who is at bedside, who states that his speech did sound off to her when he initially called.  He lowered himself to the ground and was able to get up shortly afterward.  Estimates that the symptoms lasted for less than 5 minutes.  No associated chest pain or palpitations.   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 Positive near syncope.  Respiratory: Denies shortness of breath. Gastrointestinal: No abdominal pain.  No nausea, no vomiting.  No diarrhea.  No constipation. Genitourinary: Negative for dysuria. Musculoskeletal: Negative for back pain. Skin: Negative for rash. Neurological: Negative for headaches. Question of transient left arm/leg weakness and left hand numbness.    ____________________________________________   PHYSICAL EXAM:  VITAL SIGNS: ED Triage Vitals  Encounter Vitals Group     BP 02/16/23 1245 (!) 167/94     Pulse Rate 02/16/23 1245 (!) 54     Resp 02/16/23 1245 12     Temp 02/16/23 1245 (!) 97.5 F (36.4 C)     Temp Source 02/16/23 1245 Oral     SpO2 02/16/23 1244 100 %     Weight 02/16/23 1247 195 lb (88.5 kg)     Height 02/16/23 1247 5\' 11"  (1.803 m)     Constitutional: Alert and oriented. Well appearing and in no acute distress. Eyes: Conjunctivae are normal.  Head: Atraumatic. Nose: No congestion/rhinnorhea. Mouth/Throat: Mucous membranes are moist.   Neck: No stridor.   Cardiovascular: Normal rate, regular rhythm. Good peripheral circulation. Grossly normal heart sounds.   Respiratory: Normal respiratory effort.  No retractions. Lungs CTAB. Gastrointestinal: Soft and nontender. No distention.  Musculoskeletal: No lower extremity tenderness nor edema. Right BKA noted.  Neurologic:  Normal speech and language. No gross focal neurologic deficits are appreciated. Normal finger to nose. No pronator drift.  Skin:  Skin is warm, dry and intact. No rash noted.   ____________________________________________   LABS (all labs ordered are listed, but only abnormal results are displayed)  Labs Reviewed  CBC - Abnormal; Notable for the following components:      Result Value   Hemoglobin 12.6 (*)    All other components within normal limits  COMPREHENSIVE METABOLIC PANEL - Abnormal; Notable for the following components:   Potassium 2.8 (*)    Chloride 92 (*)    Glucose, Bld 149 (*)    BUN 24 (*)    Creatinine, Ser 2.44 (*)    Albumin 3.3 (*)    GFR, Estimated 27 (*)    All other components within normal limits  URINALYSIS, ROUTINE W REFLEX MICROSCOPIC - Abnormal; Notable for the following components:   APPearance HAZY (*)    Hgb urine dipstick SMALL (*)  Protein, ur 30 (*)    Leukocytes,Ua SMALL (*)    All other components within normal limits  URINALYSIS, MICROSCOPIC (REFLEX) - Abnormal; Notable for the following components:   Bacteria, UA FEW (*)    All other components within normal limits  BASIC METABOLIC PANEL - Abnormal; Notable for the following components:   Potassium 2.4 (*)    Chloride 93 (*)    Glucose, Bld 199 (*)    BUN 28 (*)    Creatinine, Ser 2.24 (*)    GFR, Estimated 30 (*)    All other components  within normal limits  CBC - Abnormal; Notable for the following components:   Hemoglobin 11.9 (*)    HCT 36.5 (*)    MCV 78.0 (*)    MCH 25.4 (*)    All other components within normal limits  GLUCOSE, CAPILLARY - Abnormal; Notable for the following components:   Glucose-Capillary 145 (*)    All other components within normal limits  PHOSPHORUS - Abnormal; Notable for the following components:   Phosphorus 5.0 (*)    All other components within normal limits  BASIC METABOLIC PANEL - Abnormal; Notable for the following components:   Sodium 132 (*)    Potassium 3.3 (*)    Chloride 91 (*)    Glucose, Bld 410 (*)    BUN 31 (*)    Creatinine, Ser 2.35 (*)    GFR, Estimated 28 (*)    All other components within normal limits  GLUCOSE, CAPILLARY - Abnormal; Notable for the following components:   Glucose-Capillary 113 (*)    All other components within normal limits  GLUCOSE, CAPILLARY - Abnormal; Notable for the following components:   Glucose-Capillary 271 (*)    All other components within normal limits  I-STAT CHEM 8, ED - Abnormal; Notable for the following components:   Potassium 2.7 (*)    Chloride 93 (*)    BUN 28 (*)    Creatinine, Ser 2.60 (*)    Glucose, Bld 149 (*)    Calcium, Ion 1.09 (*)    All other components within normal limits  ETHANOL  DIFFERENTIAL  RAPID URINE DRUG SCREEN, HOSP PERFORMED  PROTIME-INR  APTT  MAGNESIUM  PHOSPHORUS  MAGNESIUM   ____________________________________________  EKG   EKG Interpretation Date/Time:  Thursday February 16 2023 13:05:31 EDT Ventricular Rate:  65 PR Interval:    QRS Duration:  160 QT Interval:  462 QTC Calculation: 481 R Axis:   11  Text Interpretation: Atrial flutter 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 IVCD, consider atypical RBBB Probable left ventricular hypertrophy Borderline prolonged QT interval Confirmed by Alona Bene [phone]) on 02/17/2023 3:56:34 PM         ____________________________________________  RADIOLOGY  ECHOCARDIOGRAM COMPLETE BUBBLE STUDY  Result Date: 02/17/2023    ECHOCARDIOGRAM REPORT   Patient Name:   Cory [name] Date of Exam: 02/17/2023 Medical Rec #:  [id number]    Height:       71.0 in Accession #:    [id number]   Weight:       195.0 lb Date of Birth:  01-29-1946    BSA:          2.086 m Patient Age:    76 years     BP:           143/97 mmHg Patient Gender: M            HR:           84 bpm. Exam  Location:  Inpatient Procedure: 2D Echo, Cardiac Doppler, Color Doppler and Saline Contrast Bubble            Study Indications:    Stroke 434.91/ I63.9  History:        Patient has prior history of Echocardiogram examinations, most                 recent 12/27/2022. Arrythmias:Atrial Flutter,                 Signs/Symptoms:Syncope; Risk Factors:Hypertension, Diabetes and                 Dyslipidemia.  Sonographer:    Lucendia Herrlich RCS Referring Phys: Lorin Glass IMPRESSIONS  1. Left ventricular ejection fraction, by estimation, is 55 to 60%. The left ventricle has normal function. The left ventricle has no regional wall motion abnormalities. There is mild left ventricular hypertrophy. Left ventricular diastolic function could not be evaluated.  2. Right ventricular systolic function is normal. The right ventricular size is normal. There is normal pulmonary artery systolic pressure.  3. The mitral valve is normal in structure. Trivial mitral valve regurgitation. No evidence of mitral stenosis.  4. The aortic valve is tricuspid. Aortic valve regurgitation is not visualized. No aortic stenosis is present.  5. The inferior vena cava is normal in size with greater than 50% respiratory variability, suggesting right atrial pressure of 3 mmHg.  6. Agitated saline contrast bubble study was negative, with no evidence of any interatrial shunt. Conclusion(s)/Recommendation(s): No intracardiac source of embolism detected on this transthoracic study.  Consider a transesophageal echocardiogram to exclude cardiac source of embolism if clinically indicated. FINDINGS  Left Ventricle: Left ventricular ejection fraction, by estimation, is 55 to 60%. The left ventricle has normal function. The left ventricle has no regional wall motion abnormalities. The left ventricular internal cavity size was normal in size. There is  mild left ventricular hypertrophy. Left ventricular diastolic function could not be evaluated. Right Ventricle: The right ventricular size is normal. No increase in right ventricular wall thickness. Right ventricular systolic function is normal. There is normal pulmonary artery systolic pressure. The tricuspid regurgitant velocity is 1.96 m/s, and  with an assumed right atrial pressure of 3 mmHg, the estimated right ventricular systolic pressure is 18.4 mmHg. Left Atrium: Left atrial size was normal in size. Right Atrium: Right atrial size was normal in size. Pericardium: There is no evidence of pericardial effusion. Mitral Valve: The mitral valve is normal in structure. Trivial mitral valve regurgitation. No evidence of mitral valve stenosis. Tricuspid Valve: The tricuspid valve is normal in structure. Tricuspid valve regurgitation is mild . No evidence of tricuspid stenosis. Aortic Valve: The aortic valve is tricuspid. Aortic valve regurgitation is not visualized. No aortic stenosis is present. Aortic valve peak gradient measures 2.9 mmHg. Pulmonic Valve: The pulmonic valve was not well visualized. Pulmonic valve regurgitation is not visualized. No evidence of pulmonic stenosis. Aorta: The aortic root and ascending aorta are structurally normal, with no evidence of dilitation. Venous: The inferior vena cava is normal in size with greater than 50% respiratory variability, suggesting right atrial pressure of 3 mmHg. IAS/Shunts: The atrial septum is grossly normal. Agitated saline contrast was given intravenously to evaluate for intracardiac shunting.  Agitated saline contrast bubble study was negative, with no evidence of any interatrial shunt.  LEFT VENTRICLE PLAX 2D LVIDd:         3.90 cm   Diastology LVIDs:  2.70 cm   LV e' medial:    7.51 cm/s LV PW:         1.40 cm   LV E/e' medial:  9.6 LV IVS:        1.30 cm   LV e' lateral:   11.40 cm/s LVOT diam:     2.10 cm   LV E/e' lateral: 6.3 LV SV:         43 LV SV Index:   21 LVOT Area:     3.46 cm  RIGHT VENTRICLE             IVC RV S prime:     11.20 cm/s  IVC diam: 1.50 cm TAPSE (M-mode): 1.6 cm LEFT ATRIUM             Index        RIGHT ATRIUM           Index LA diam:        3.90 cm 1.87 cm/m   RA Area:     18.30 cm LA Vol (A2C):   67.3 ml 32.26 ml/m  RA Volume:   44.80 ml  21.48 ml/m LA Vol (A4C):   60.4 ml 28.96 ml/m LA Biplane Vol: 68.7 ml 32.93 ml/m  AORTIC VALVE AV Area (Vmax): 3.05 cm AV Vmax:        85.30 cm/s AV Peak Grad:   2.9 mmHg LVOT Vmax:      75.22 cm/s LVOT Vmean:     48.825 cm/s LVOT VTI:       0.124 m  AORTA Ao Root diam: 3.50 cm Ao Asc diam:  3.60 cm MITRAL VALVE               TRICUSPID VALVE MV Area (PHT): 5.84 cm    TR Peak grad:   15.4 mmHg MV Decel Time: 130 msec    TR Vmax:        196.00 cm/s MR Peak grad: 50.0 mmHg MR Vmax:      353.50 cm/s  SHUNTS MV E velocity: 72.20 cm/s  Systemic VTI:  0.12 m                            Systemic Diam: 2.10 cm Sunit Tolia Electronically signed by Tessa Lerner Signature Date/Time: 02/17/2023/1:31:26 PM    Final    MR BRAIN W WO CONTRAST  Result Date: 02/16/2023 CLINICAL DATA:  Transient ischemic attack (TIA) EXAM: MRI HEAD WITHOUT AND WITH CONTRAST TECHNIQUE: Multiplanar, multiecho pulse sequences of the brain and surrounding structures were obtained without and with intravenous contrast. CONTRAST:  9mL GADAVIST GADOBUTROL 1 MMOL/ML IV SOLN COMPARISON:  CT head from today. FINDINGS: Brain: No acute infarction, acute hemorrhage, hydrocephalus, extra-axial collection or mass lesion. Patchy white matter T2/FLAIR hyperintensities are  compatible with chronic microvascular ischemic change. Remote right frontal white matter infarct. Punctate foci of susceptibility artifact in the region of the thalami bilaterally are compatible with chronic microhemorrhages, likely due to hypertension. No pathologic enhancement. Vascular: Major arterial flow voids are maintained at the skull base. Skull and upper cervical spine: Normal marrow signal. Sinuses/Orbits: Clear sinuses.  No acute orbital findings. Other: No mastoid effusions. IMPRESSION: 1. No evidence of acute intracranial abnormality. 2. Chronic microvascular disease and chronic microhemorrhages. Electronically Signed   By: Feliberto Harts M.D.   On: 02/16/2023 19:53    ____________________________________________   PROCEDURES  Procedure(s) performed:   Procedures  CRITICAL CARE Performed  by: Maia Plan Total critical care time: 35 minutes Critical care time was exclusive of separately billable procedures and treating other patients. Critical care was necessary to treat or prevent imminent or life-threatening deterioration. Critical care was time spent personally by me on the following activities: development of treatment plan with patient and/or surrogate as well as nursing, discussions with consultants, evaluation of patient's response to treatment, examination of patient, obtaining history from patient or surrogate, ordering and performing treatments and interventions, ordering and review of laboratory studies, ordering and review of radiographic studies, pulse oximetry and re-evaluation of patient's condition.  Alona Bene, MD Emergency Medicine  ____________________________________________   INITIAL IMPRESSION / ASSESSMENT AND PLAN / ED COURSE  Pertinent labs & imaging results that were available during my care of the patient were reviewed by me and considered in my medical decision making (see chart for details).   This patient is Presenting for Evaluation of AMS,  which does require a range of treatment options, and is a complaint that involves a high risk of morbidity and mortality.  The Differential Diagnoses includes but is not exclusive to alcohol, illicit or prescription medications, intracranial pathology such as stroke, intracerebral hemorrhage, fever or infectious causes including sepsis, hypoxemia, uremia, trauma, endocrine related disorders such as diabetes, hypoglycemia, thyroid-related diseases, etc.   Critical Interventions-    Medications  potassium chloride 10 mEq in 100 mL IVPB (has no administration in time range)  insulin aspart (novoLOG) injection 0-9 Units (5 Units Subcutaneous Given 02/17/23 1245)  insulin aspart (novoLOG) injection 0-5 Units ( Subcutaneous Not Given 02/16/23 2134)  acetaminophen (TYLENOL) tablet 650 mg (has no administration in time range)    Or  acetaminophen (TYLENOL) suppository 650 mg (has no administration in time range)  albuterol (PROVENTIL) (2.5 MG/3ML) 0.083% nebulizer solution 2.5 mg (has no administration in time range)  hydrALAZINE (APRESOLINE) injection 10 mg (10 mg Intravenous Given 02/16/23 1911)  insulin glargine-yfgn (SEMGLEE) injection 20 Units (20 Units Subcutaneous Given 02/16/23 2221)  apixaban (ELIQUIS) tablet 5 mg (5 mg Oral Given 02/17/23 0823)  atorvastatin (LIPITOR) tablet 20 mg (20 mg Oral Given 02/16/23 2131)  metoprolol tartrate (LOPRESSOR) tablet 25 mg (25 mg Oral Given 02/17/23 0823)  tamsulosin (FLOMAX) capsule 0.4 mg (0.4 mg Oral Given 02/17/23 0823)  0.9 %  sodium chloride infusion ( Intravenous Infusion Verify 02/17/23 1517)  potassium chloride SA (KLOR-CON M) CR tablet 40 mEq (has no administration in time range)  gadobutrol (GADAVIST) 1 MMOL/ML injection 9 mL (9 mLs Intravenous Contrast Given 02/16/23 1813)  potassium chloride (KLOR-CON) packet 40 mEq (40 mEq Oral Given 02/17/23 0551)  potassium chloride 10 mEq in 100 mL IVPB (0 mEq Intravenous Stopped 02/17/23 1148)     Reassessment after intervention: symptoms improved.    I did obtain Additional Historical Information from daughter at bedside.    Clinical Laboratory Tests Ordered, included potassium low at 2.4.  Had an elevated and near baseline.  No leukocytosis.  No UTI.  Radiologic Tests Ordered, included CT head. I independently interpreted the images and agree with radiology interpretation.   Cardiac Monitor Tracing which shows NSR.    Social Determinants of Health Risk patient is a non-smoker.   Medical Decision Making: Summary:  Presents emergency department with near syncope versus TIA symptoms.  History does suggest some possible TIA given unilateral weakness.  No trauma.  CT head negative.  Plan for admit.  On reassessment patient has no change in symptoms and agrees with plan for admission.  Patient's presentation is most consistent with acute presentation with potential threat to life or bodily function.   Disposition: discharge  ____________________________________________  FINAL CLINICAL IMPRESSION(S) / ED DIAGNOSES  Final diagnoses:  Near syncope  Hypokalemia     Note:  This document was prepared using Dragon voice recognition software and may include unintentional dictation errors.  Alona Bene, MD, [facility] Emergency Medicine    Ranson Belluomini, Arlyss Repress, MD 02/17/23 [phone]

## 2023-02-16 NOTE — ED Notes (Signed)
Pt's niece [name], requesting updates with permission of pt. She is his ride home.

## 2023-02-16 NOTE — ED Notes (Signed)
ED TO INPATIENT HANDOFF REPORT  ED Nurse Name and Phone #: [phone]  Pennie Rushing A. Chayce Rullo, RN  S Name/Age/Gender Cory [name] 77 y.o. male Room/Bed: 024C/024C  Code Status   Code Status: Full Code  Home/SNF/Other Home Patient oriented to: self, place, time, and situation Is this baseline? Yes   Triage Complete: Triage complete  Chief Complaint Syncope [R55]  Triage Note Pt BIB EMS from home for dizziness, left arm heaviness, and near syncope. Pt lowered self to ground form standing and denies any trauma to head. Pt denies CP, SOB and left arm heaviness now. Pt endorses bright red blood in stool and states he has a follow up appointment next week.Pt reports history of afib, but shows a-flutter on monitor.  A&Ox4.    Allergies No Known Allergies  Level of Care/Admitting Diagnosis ED Disposition     ED Disposition  Admit   Condition  --   Comment  Hospital Area: [facility] [[id number]]  Level of Care: Telemetry Medical [104]  May place patient in observation at [facility] or [facility] if equivalent level of care is available:: Yes  Covid Evaluation: Asymptomatic - no recent exposure (last 10 days) testing not required  Diagnosis: Syncope [206001]  Admitting Physician: Lorin Glass [[id number]]  Attending Physician: Lorin Glass [[id number]]          B Medical/Surgery History Past Medical History:  Diagnosis Date   Diabetes mellitus without complication (HCC)    Hyperlipidemia    Hypertension    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   Sepsis (HCC) 11/27/2020   Past Surgical History:  Procedure Laterality Date   AMPUTATION Right 12/04/2020   Procedure: RIGHT BELOW KNEE AMPUTATION;  Surgeon: Nadara Mustard, MD;  Location: [facility] OR;  Service: Orthopedics;  Laterality: Right;   APPENDECTOMY     APPLICATION OF WOUND VAC Right 12/04/2020   Procedure: APPLICATION OF WOUND VAC;  Surgeon: Nadara Mustard, MD;  Location: [facility] OR;  Service: Orthopedics;   Laterality: Right;     A IV Location/Drains/Wounds Patient Lines/Drains/Airways Status     Active Line/Drains/Airways     Name Placement date Placement time Site Days   Peripheral IV 02/16/23 22 G Left;Posterior Hand 02/16/23  1318  Hand  less than 1   Negative Pressure Wound Therapy Knee Right;Circumferential 12/04/20  0846  --  804            Intake/Output Last 24 hours No intake or output data in the 24 hours ending 02/16/23 1606  Labs/Imaging Results for orders placed or performed during the hospital encounter of 02/16/23 (from the past 48 hour(s))  CBC     Status: Abnormal   Collection Time: 02/16/23  1:29 PM  Result Value Ref Range   WBC 5.2 4.0 - 10.5 K/uL   RBC 4.83 4.22 - 5.81 MIL/uL   Hemoglobin 12.6 (L) 13.0 - 17.0 g/dL   HCT 95.6 21.3 - 08.6 %   MCV 80.7 80.0 - 100.0 fL   MCH 26.1 26.0 - 34.0 pg   MCHC 32.3 30.0 - 36.0 g/dL   RDW 57.8 46.9 - 62.9 %   Platelets 229 150 - 400 K/uL   nRBC 0.0 0.0 - 0.2 %    Comment: Performed at [facility], [address], [place], [place] [address]  Differential     Status: None   Collection Time: 02/16/23  1:29 PM  Result Value Ref Range   Neutrophils Relative % 53 %  Neutro Abs 2.7 1.7 - 7.7 K/uL   Lymphocytes Relative 31 %   Lymphs Abs 1.6 0.7 - 4.0 K/uL   Monocytes Relative 14 %   Monocytes Absolute 0.7 0.1 - 1.0 K/uL   Eosinophils Relative 2 %   Eosinophils Absolute 0.1 0.0 - 0.5 K/uL   Basophils Relative 0 %   Basophils Absolute 0.0 0.0 - 0.1 K/uL   Immature Granulocytes 0 %   Abs Immature Granulocytes 0.02 0.00 - 0.07 K/uL    Comment: Performed at [facility], [address], [place], [place] [address]  Comprehensive metabolic panel     Status: Abnormal   Collection Time: 02/16/23  1:29 PM  Result Value Ref Range   Sodium 137 135 - 145 mmol/L   Potassium 2.8 (L) 3.5 - 5.1 mmol/L   Chloride 92 (L) 98 - 111 mmol/L   CO2 31 22 - 32 mmol/L   Glucose, Bld 149 (H) 70 - 99 mg/dL    Comment:  Glucose reference range applies only to samples taken after fasting for at least 8 hours.   BUN 24 (H) 8 - 23 mg/dL   Creatinine, Ser 9.14 (H) 0.61 - 1.24 mg/dL   Calcium 9.4 8.9 - 78.2 mg/dL   Total Protein 8.0 6.5 - 8.1 g/dL   Albumin 3.3 (L) 3.5 - 5.0 g/dL   AST 18 15 - 41 U/L   ALT 14 0 - 44 U/L   Alkaline Phosphatase 74 38 - 126 U/L   Total Bilirubin 0.5 0.3 - 1.2 mg/dL   GFR, Estimated 27 (L) >60 mL/min    Comment: (NOTE) Calculated using the CKD-EPI Creatinine Equation (2021)    Anion gap 14 5 - 15    Comment: Performed at [facility], [address], [place], [place] [address]  I-stat chem 8, ED     Status: Abnormal   Collection Time: 02/16/23  2:29 PM  Result Value Ref Range   Sodium 136 135 - 145 mmol/L   Potassium 2.7 (LL) 3.5 - 5.1 mmol/L   Chloride 93 (L) 98 - 111 mmol/L   BUN 28 (H) 8 - 23 mg/dL   Creatinine, Ser 3.08 (H) 0.61 - 1.24 mg/dL   Glucose, Bld 657 (H) 70 - 99 mg/dL    Comment: Glucose reference range applies only to samples taken after fasting for at least 8 hours.   Calcium, Ion 1.09 (L) 1.15 - 1.40 mmol/L   TCO2 32 22 - 32 mmol/L   Hemoglobin 14.6 13.0 - 17.0 g/dL   HCT 84.6 96.2 - 95.2 %   Comment NOTIFIED PHYSICIAN    CT HEAD WO CONTRAST  Result Date: 02/16/2023 CLINICAL DATA:  Transient ischemic attack (TIA) Dizziness.  Near syncope. EXAM: CT HEAD WITHOUT CONTRAST TECHNIQUE: Contiguous axial images were obtained from the base of the skull through the vertex without intravenous contrast. RADIATION DOSE REDUCTION: This exam was performed according to the departmental dose-optimization program which includes automated exposure control, adjustment of the mA and/or kV according to patient size and/or use of iterative reconstruction technique. COMPARISON:  Head CT 12/24/2022 FINDINGS: Brain: No intracranial hemorrhage, mass effect, or midline shift. No hydrocephalus. The basilar cisterns are patent. Stable chronic small vessel ischemic change.  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 No evidence of territorial infarct or acute ischemia. No extra-axial or intracranial fluid collection. Vascular: Atherosclerosis of skullbase vasculature without hyperdense vessel or abnormal calcification. Skull: No fracture or focal lesion. Sinuses/Orbits: No  acute findings. Minor mucosal thickening. Bilateral lens resection. Other: None. IMPRESSION: 1. No acute intracranial abnormality. 2. Stable chronic small vessel ischemic change. Electronically Signed   By: Narda Rutherford M.D.   On: 02/16/2023 15:47    Pending Labs Unresulted Labs (From admission, onward)     Start     Ordered   02/17/23 0500  Basic metabolic panel  Tomorrow morning,   R        02/16/23 1536   02/17/23 0500  CBC  Tomorrow morning,   R        02/16/23 1536   02/16/23 1537  Magnesium  Add-on,   AD        02/16/23 1536   02/16/23 1537  Phosphorus  Add-on,   AD        02/16/23 1536   02/16/23 1430  Protime-INR  Once,   STAT        02/16/23 1430   02/16/23 1430  APTT  Once,   STAT        02/16/23 1430   02/16/23 1329  Ethanol  Once,   URGENT        02/16/23 1332   02/16/23 1329  Urine rapid drug screen (hosp performed)  Once,   STAT        02/16/23 1332   02/16/23 1329  Urinalysis, Routine w reflex microscopic -Urine, Clean Catch  Once,   URGENT       Question:  Specimen Source  Answer:  Urine, Clean Catch   02/16/23 1332            Vitals/Pain Today's Vitals   02/16/23 1244 02/16/23 1245 02/16/23 1247 02/16/23 1248  BP:  (!) 167/94    Pulse:  (!) 54    Resp:  12    Temp:  (!) 97.5 F (36.4 C)    TempSrc:  Oral    SpO2: 100% 98%  98%  Weight:   88.5 kg   Height:   5\' 11"  (1.803 m)   PainSc:    0-No pain    Isolation Precautions No active isolations  Medications Medications  potassium chloride 10 mEq in 100 mL IVPB (has no administration in time range)  insulin aspart (novoLOG) injection 0-9 Units (has no administration in time  range)  insulin aspart (novoLOG) injection 0-5 Units (has no administration in time range)  acetaminophen (TYLENOL) tablet 650 mg (has no administration in time range)    Or  acetaminophen (TYLENOL) suppository 650 mg (has no administration in time range)  albuterol (PROVENTIL) (2.5 MG/3ML) 0.083% nebulizer solution 2.5 mg (has no administration in time range)  hydrALAZINE (APRESOLINE) injection 10 mg (has no administration in time range)    Mobility Walks with assistance      Focused Assessments    R Recommendations: See Admitting Provider Note  Report given to:   Additional Notes: Call or epic message for any additional details

## 2023-02-17 ENCOUNTER — Observation Stay (HOSPITAL_COMMUNITY): Payer: Medicare HMO

## 2023-02-17 DIAGNOSIS — Z7901 Long term (current) use of anticoagulants: Secondary | ICD-10-CM | POA: Diagnosis not present

## 2023-02-17 DIAGNOSIS — R55 Syncope and collapse: Secondary | ICD-10-CM

## 2023-02-17 DIAGNOSIS — K649 Unspecified hemorrhoids: Secondary | ICD-10-CM | POA: Diagnosis present

## 2023-02-17 DIAGNOSIS — I10 Essential (primary) hypertension: Secondary | ICD-10-CM | POA: Diagnosis not present

## 2023-02-17 DIAGNOSIS — M7989 Other specified soft tissue disorders: Secondary | ICD-10-CM | POA: Diagnosis present

## 2023-02-17 DIAGNOSIS — I4892 Unspecified atrial flutter: Secondary | ICD-10-CM

## 2023-02-17 DIAGNOSIS — Z79899 Other long term (current) drug therapy: Secondary | ICD-10-CM | POA: Diagnosis not present

## 2023-02-17 DIAGNOSIS — I4819 Other persistent atrial fibrillation: Secondary | ICD-10-CM | POA: Diagnosis present

## 2023-02-17 DIAGNOSIS — N179 Acute kidney failure, unspecified: Secondary | ICD-10-CM | POA: Diagnosis present

## 2023-02-17 DIAGNOSIS — I130 Hypertensive heart and chronic kidney disease with heart failure and stage 1 through stage 4 chronic kidney disease, or unspecified chronic kidney disease: Secondary | ICD-10-CM | POA: Diagnosis present

## 2023-02-17 DIAGNOSIS — Z794 Long term (current) use of insulin: Secondary | ICD-10-CM | POA: Diagnosis not present

## 2023-02-17 DIAGNOSIS — I951 Orthostatic hypotension: Secondary | ICD-10-CM | POA: Diagnosis present

## 2023-02-17 DIAGNOSIS — E876 Hypokalemia: Secondary | ICD-10-CM

## 2023-02-17 DIAGNOSIS — N1832 Chronic kidney disease, stage 3b: Secondary | ICD-10-CM | POA: Diagnosis present

## 2023-02-17 DIAGNOSIS — E1165 Type 2 diabetes mellitus with hyperglycemia: Secondary | ICD-10-CM | POA: Diagnosis present

## 2023-02-17 DIAGNOSIS — Z833 Family history of diabetes mellitus: Secondary | ICD-10-CM | POA: Diagnosis not present

## 2023-02-17 DIAGNOSIS — E1122 Type 2 diabetes mellitus with diabetic chronic kidney disease: Secondary | ICD-10-CM | POA: Diagnosis present

## 2023-02-17 DIAGNOSIS — E860 Dehydration: Secondary | ICD-10-CM | POA: Diagnosis present

## 2023-02-17 DIAGNOSIS — Z89511 Acquired absence of right leg below knee: Secondary | ICD-10-CM | POA: Diagnosis not present

## 2023-02-17 DIAGNOSIS — I5032 Chronic diastolic (congestive) heart failure: Secondary | ICD-10-CM | POA: Diagnosis present

## 2023-02-17 DIAGNOSIS — E785 Hyperlipidemia, unspecified: Secondary | ICD-10-CM | POA: Diagnosis present

## 2023-02-17 DIAGNOSIS — N400 Enlarged prostate without lower urinary tract symptoms: Secondary | ICD-10-CM | POA: Diagnosis present

## 2023-02-17 LAB — CBC
HCT: 36.5 % — ABNORMAL LOW (ref 39.0–52.0)
Hemoglobin: 11.9 g/dL — ABNORMAL LOW (ref 13.0–17.0)
MCH: 25.4 pg — ABNORMAL LOW (ref 26.0–34.0)
MCHC: 32.6 g/dL (ref 30.0–36.0)
MCV: 78 fL — ABNORMAL LOW (ref 80.0–100.0)
Platelets: 217 10*3/uL (ref 150–400)
RBC: 4.68 MIL/uL (ref 4.22–5.81)
RDW: 14.1 % (ref 11.5–15.5)
WBC: 5 10*3/uL (ref 4.0–10.5)
nRBC: 0 % (ref 0.0–0.2)

## 2023-02-17 LAB — ECHOCARDIOGRAM COMPLETE BUBBLE STUDY
AR max vel: 3.05 cm2
AV Peak grad: 2.9 mm[Hg]
Ao pk vel: 0.85 m/s
Area-P 1/2: 5.84 cm2
MV M vel: 3.54 m/s
MV Peak grad: 50 mm[Hg]
S' Lateral: 2.7 cm

## 2023-02-17 LAB — BASIC METABOLIC PANEL
Anion gap: 13 (ref 5–15)
Anion gap: 13 (ref 5–15)
BUN: 28 mg/dL — ABNORMAL HIGH (ref 8–23)
BUN: 31 mg/dL — ABNORMAL HIGH (ref 8–23)
CO2: 30 mmol/L (ref 22–32)
CO2: 28 mmol/L (ref 22–32)
Calcium: 9.1 mg/dL (ref 8.9–10.3)
Calcium: 8.9 mg/dL (ref 8.9–10.3)
Chloride: 93 mmol/L — ABNORMAL LOW (ref 98–111)
Chloride: 91 mmol/L — ABNORMAL LOW (ref 98–111)
Creatinine, Ser: 2.24 mg/dL — ABNORMAL HIGH (ref 0.61–1.24)
Creatinine, Ser: 2.35 mg/dL — ABNORMAL HIGH (ref 0.61–1.24)
GFR, Estimated: 30 mL/min — ABNORMAL LOW (ref >60)
GFR, Estimated: 28 mL/min — ABNORMAL LOW (ref >60)
Glucose, Bld: 199 mg/dL — ABNORMAL HIGH (ref 70–99)
Glucose, Bld: 410 mg/dL — ABNORMAL HIGH (ref 70–99)
Potassium: 2.4 mmol/L — CL (ref 3.5–5.1)
Potassium: 3.3 mmol/L — ABNORMAL LOW (ref 3.5–5.1)
Sodium: 136 mmol/L (ref 135–145)
Sodium: 132 mmol/L — ABNORMAL LOW (ref 135–145)

## 2023-02-17 LAB — GLUCOSE, CAPILLARY
Glucose-Capillary: 113 mg/dL — ABNORMAL HIGH (ref 70–99)
Glucose-Capillary: 271 mg/dL — ABNORMAL HIGH (ref 70–99)
Glucose-Capillary: 419 mg/dL — ABNORMAL HIGH (ref 70–99)
Glucose-Capillary: 289 mg/dL — ABNORMAL HIGH (ref 70–99)

## 2023-02-17 LAB — PHOSPHORUS: Phosphorus: 5 mg/dL — ABNORMAL HIGH (ref 2.5–4.6)

## 2023-02-17 LAB — MAGNESIUM: Magnesium: 2.1 mg/dL (ref 1.7–2.4)

## 2023-02-17 MED ORDER — POTASSIUM CHLORIDE CRYS ER 20 MEQ PO TBCR
40.0000 meq | EXTENDED_RELEASE_TABLET | Freq: Once | ORAL | Status: AC
  Administered 2023-02-17: 40 meq via ORAL
  Filled 2023-02-17: qty 2

## 2023-02-17 MED ORDER — SODIUM CHLORIDE 0.9 % IV SOLN: INTRAVENOUS | Status: AC

## 2023-02-17 MED ORDER — POTASSIUM CHLORIDE 20 MEQ PO PACK
40.0000 meq | PACK | ORAL | Status: AC
  Administered 2023-02-17: 40 meq via ORAL
  Filled 2023-02-17: qty 2

## 2023-02-17 MED ORDER — INSULIN ASPART 100 UNIT/ML IJ SOLN
12.0000 [IU] | Freq: Once | INTRAMUSCULAR | Status: AC
  Administered 2023-02-17: 12 [IU] via SUBCUTANEOUS

## 2023-02-17 MED ORDER — POTASSIUM CHLORIDE 10 MEQ/100ML IV SOLN
10.0000 meq | INTRAVENOUS | Status: AC
  Administered 2023-02-17 (×4): 10 meq via INTRAVENOUS
  Filled 2023-02-17 (×4): qty 100

## 2023-02-17 MED ORDER — INSULIN ASPART 100 UNIT/ML IJ SOLN
0.0000 [IU] | Freq: Three times a day (TID) | INTRAMUSCULAR | Status: DC
  Administered 2023-02-18 (×2): 3 [IU] via SUBCUTANEOUS
  Administered 2023-02-19: 5 [IU] via SUBCUTANEOUS

## 2023-02-17 MED ORDER — ORAL CARE MOUTH RINSE: 15.0000 mL | OROMUCOSAL | Status: DC | PRN

## 2023-02-17 MED ORDER — INSULIN GLARGINE-YFGN 100 UNIT/ML ~~LOC~~ SOLN
24.0000 [IU] | Freq: Every day | SUBCUTANEOUS | Status: DC
  Filled 2023-02-17: qty 0.24

## 2023-02-17 MED ORDER — INSULIN GLARGINE-YFGN 100 UNIT/ML ~~LOC~~ SOLN
26.0000 [IU] | Freq: Every day | SUBCUTANEOUS | Status: DC
  Administered 2023-02-17 – 2023-02-18 (×2): 26 [IU] via SUBCUTANEOUS
  Filled 2023-02-17 (×4): qty 0.26

## 2023-02-17 MED ORDER — INSULIN ASPART 100 UNIT/ML IJ SOLN
0.0000 [IU] | Freq: Every day | INTRAMUSCULAR | Status: DC
  Administered 2023-02-17: 3 [IU] via SUBCUTANEOUS
  Administered 2023-02-18: 2 [IU] via SUBCUTANEOUS

## 2023-02-17 NOTE — Discharge Instructions (Signed)

## 2023-02-17 NOTE — Progress Notes (Addendum)
  Hypokalemia: - Serum potassium 2.4.  Patient's nurse reporting that even though potassium replacement has been ordered earlier it was not given or scanned> so ultimately patient did not receive it. - Giving oral potassium 40 mEq and IV potassium 10 x 4 mEq. -Checking mag and Phos level. -Recheck potassium level later today and replete as needed.  Tereasa Coop, MD [facility] 02/17/2023, 5:33 AM

## 2023-02-17 NOTE — Progress Notes (Signed)
Echocardiogram 2D Echocardiogram has been performed.  Cory [name] 02/17/2023, 9:45 AM

## 2023-02-17 NOTE — Progress Notes (Addendum)
PROGRESS NOTE        PATIENT DETAILS Name: Cory [name] Age: 77 y.o. Sex: male Date of Birth: November 07, 1945 Admit Date: 02/16/2023 Admitting Physician Lorin Glass, MD LKG:[id number], [name], MD  Brief Summary: Patient is a 77 y.o.  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  Per patient-for 2 days prior to admission-he has been having diarrhea-and having up to 6 bowel movements a day.  Significant events: 10/10>> admit to [facility]  Significant studies: 8/20>> A1c 14.0 10/10>> CT head: No acute intracranial abnormality 10/10>> MRI brain: No acute CVA.  Significant microbiology data: None  Procedures: None  Consults: None  Subjective: Lying comfortably in bed-denies any chest pain or shortness of breath.  Objective: Vitals: Blood pressure (!) 144/86, pulse 85, temperature 97.9 F (36.6 C), temperature source Oral, resp. rate 19, height 5\' 11"  (1.803 m), weight 88.5 kg, SpO2 96%.   Exam: Gen Exam:Alert awake-not in any distress HEENT:atraumatic, normocephalic Chest: B/L clear to auscultation anteriorly CVS:S1S2 regular Abdomen:soft non tender, non distended Extremities:no edema-right BKA-prosthesis in place. Neurology: Non focal Skin: no rash  Pertinent Labs/Radiology:    Latest Ref Rng & Units 02/17/2023    3:27 AM 02/16/2023    2:29 PM 02/16/2023    1:29 PM  CBC  WBC 4.0 - 10.5 K/uL 5.0   5.2   Hemoglobin 13.0 - 17.0 g/dL 25.3  66.4  40.3   Hematocrit 39.0 - 52.0 % 36.5  43.0  39.0   Platelets 150 - 400 K/uL 217   229     Lab Results  Component Value Date   NA 136 02/17/2023   K 2.4 (LL) 02/17/2023   CL 93 (L) 02/17/2023   CO2 30 02/17/2023      Assessment/Plan: Presyncope/transient left arm heaviness for a few seconds Suspect this is orthostatic hypotension-in the setting of diarrheal illness-diuretic use Doubt TIA-but getting a workup nonetheless (echo/carotid pending-lipids ordered for tomorrow) Thankfully diarrhea has essentially resolved. 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 AKI likely hemodynamically mediated in setting of diarrhea/diuretic use Gradually improving with gentle hydration-continue IVF for another day Repeat electrolytes tomorrow  Hypokalemia Due to diarrheal illness/diuretic use Replete/recheck  Persistent atrial flutter Rate controlled Metoprolol/Eliquis Follows with cardiology at [facility]  DM-2 (A1c 14 on 8/20) with uncontrolled hyperglycemia/poor long-term control CBG stable-20 units of Semglee + SSI   Recent Labs    02/16/23 2133 02/17/23 0820  GLUCAP 145* 113*      HTN BP controlled with metoprolol   HLD Statin   BPH Flomax   Debility/deconditioning PT/OT eval  BMI: Estimated body mass index is 27.2 kg/m as calculated from the following:   Height as of this encounter: 5\' 11"  (1.803 m).   Weight as of this encounter: 88.5 kg.   Code status:   Code Status: Full Code   DVT Prophylaxis: apixaban (ELIQUIS) tablet 5 mg    Family Communication: None at bedside   Disposition Plan: Status is: Observation The patient will require care spanning > 2  midnights and should be moved to inpatient because: Severity of illness   Planned Discharge Destination:Home health   Diet: Diet Order             Diet heart healthy/carb modified Room service appropriate? Yes; Fluid consistency: Thin  Diet effective now                     Antimicrobial agents: Anti-infectives (From admission, onward)    None        MEDICATIONS: Scheduled Meds:  apixaban  5 mg Oral BID    atorvastatin  20 mg Oral QHS   insulin aspart  0-5 Units Subcutaneous QHS   insulin aspart  0-9 Units Subcutaneous TID WC   insulin glargine-yfgn  20 Units Subcutaneous QHS   metoprolol tartrate  25 mg Oral BID   tamsulosin  0.4 mg Oral Daily   Continuous Infusions:  sodium chloride 50 mL/hr at 02/17/23 0823   potassium chloride 10 mEq (02/17/23 0821)   PRN Meds:.acetaminophen **OR** acetaminophen, albuterol, hydrALAZINE   I have personally reviewed following labs and imaging studies  LABORATORY DATA: CBC: Recent Labs  Lab 02/16/23 1329 02/16/23 1429 02/17/23 0327  WBC 5.2  --  5.0  NEUTROABS 2.7  --   --   HGB 12.6* 14.6 11.9*  HCT 39.0 43.0 36.5*  MCV 80.7  --  78.0*  PLT 229  --  217    Basic Metabolic Panel: Recent Labs  Lab 02/16/23 1329 02/16/23 1429 02/16/23 2028 02/17/23 0327  NA 137 136  --  136  K 2.8* 2.7*  --  2.4*  CL 92* 93*  --  93*  CO2 31  --   --  30  GLUCOSE 149* 149*  --  199*  BUN 24* 28*  --  28*  CREATININE 2.44* 2.60*  --  2.24*  CALCIUM 9.4  --   --  9.1  MG  --   --  2.0 2.1  PHOS  --   --  3.4 5.0*    GFR: Estimated Creatinine Clearance: 30.3 mL/min (A) (by C-G formula based on SCr of 2.24 mg/dL (H)).  Liver Function Tests: Recent Labs  Lab 02/16/23 1329  AST 18  ALT 14  ALKPHOS 74  BILITOT 0.5  PROT 8.0  ALBUMIN 3.3*   No results for input(s): "LIPASE", "AMYLASE" in the last 168 hours. No results for input(s): "AMMONIA" in the last 168 hours.  Coagulation Profile: Recent Labs  Lab 02/16/23 2028  INR 1.1    Cardiac Enzymes: No results for input(s): "CKTOTAL", "CKMB", "CKMBINDEX", "TROPONINI" in the last 168 hours.  BNP (last 3 results) No results for input(s): "PROBNP" in the last 8760 hours.  Lipid Profile: No results for input(s): "CHOL", "HDL", "LDLCALC", "TRIG", "CHOLHDL", "LDLDIRECT" in the last 72 hours.  Thyroid Function Tests: No results for input(s): "TSH", "T4TOTAL", "FREET4", "T3FREE",  "THYROIDAB" in the last 72 hours.  Anemia Panel: No results for input(s): "VITAMINB12", "FOLATE", "FERRITIN", "TIBC", "IRON", "RETICCTPCT" in the last 72 hours.  Urine analysis:    Component Value Date/Time   COLORURINE YELLOW 02/16/2023 1444   APPEARANCEUR HAZY (A) 02/16/2023 1444   LABSPEC 1.010 02/16/2023 1444   PHURINE 6.0 02/16/2023 1444   GLUCOSEU NEGATIVE 02/16/2023 1444   HGBUR SMALL (A) 02/16/2023 1444   BILIRUBINUR NEGATIVE 02/16/2023 1444   KETONESUR NEGATIVE 02/16/2023 1444   PROTEINUR 30 (A) 02/16/2023 1444   NITRITE NEGATIVE 02/16/2023 1444   LEUKOCYTESUR SMALL (A) 02/16/2023 1444  Sepsis Labs: Lactic Acid, Venous    Component Value Date/Time   LATICACIDVEN 1.5 12/26/2022 1558    MICROBIOLOGY: No results found for this or any previous visit (from the past 240 hour(s)).  RADIOLOGY STUDIES/RESULTS: MR BRAIN W WO CONTRAST  Result Date: 02/16/2023 CLINICAL DATA:  Transient ischemic attack (TIA) EXAM: MRI HEAD WITHOUT AND WITH CONTRAST TECHNIQUE: Multiplanar, multiecho pulse sequences of the brain and surrounding structures were obtained without and with intravenous contrast. CONTRAST:  9mL GADAVIST GADOBUTROL 1 MMOL/ML IV SOLN COMPARISON:  CT head from today. FINDINGS: Brain: No acute infarction, acute hemorrhage, hydrocephalus, extra-axial collection or mass lesion. Patchy white matter T2/FLAIR hyperintensities are compatible with chronic microvascular ischemic change. Remote right frontal white matter infarct. Punctate foci of susceptibility artifact in the region of the thalami bilaterally are compatible with chronic microhemorrhages, likely due to hypertension. No pathologic enhancement. Vascular: Major arterial flow voids are maintained at the skull base. Skull and upper cervical spine: Normal marrow signal. Sinuses/Orbits: Clear sinuses.  No acute orbital findings. Other: No mastoid effusions. IMPRESSION: 1. No evidence of acute intracranial abnormality. 2.  Chronic microvascular disease and chronic microhemorrhages. Electronically Signed   By: Feliberto Harts M.D.   On: 02/16/2023 19:53   CT HEAD WO CONTRAST  Result Date: 02/16/2023 CLINICAL DATA:  Transient ischemic attack (TIA) Dizziness.  Near syncope. EXAM: CT HEAD WITHOUT CONTRAST TECHNIQUE: Contiguous axial images were obtained from the base of the skull through the vertex without intravenous contrast. RADIATION DOSE REDUCTION: This exam was performed according to the departmental dose-optimization program which includes automated exposure control, adjustment of the mA and/or kV according to patient size and/or use of iterative reconstruction technique. COMPARISON:  Head CT 12/24/2022 FINDINGS: Brain: No intracranial hemorrhage, mass effect, or midline shift. No hydrocephalus. The basilar cisterns are patent. Stable chronic small vessel ischemic change. 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 No evidence of territorial infarct or acute ischemia. No extra-axial or intracranial fluid collection. Vascular: Atherosclerosis of skullbase vasculature without hyperdense vessel or abnormal calcification. Skull: No fracture or focal lesion. Sinuses/Orbits: No acute findings. Minor mucosal thickening. Bilateral lens resection. Other: None. IMPRESSION: 1. No acute intracranial abnormality. 2. Stable chronic small vessel ischemic change. Electronically Signed   By: Narda Rutherford M.D.   On: 02/16/2023 15:47     LOS: 0 days   Jeoffrey Massed, MD  [facility]    To contact the attending provider between 7A-7P or the covering provider during after hours 7P-7A, please log into the web site [email or web address] and access using universal [place] password for that web site. If you do not have the password, please call the hospital operator.  02/17/2023, 9:26 AM

## 2023-02-17 NOTE — Evaluation (Signed)
Occupational Therapy Evaluation Patient Details Name: Cory [name] MRN: [id number] DOB: 1945-09-06 Today's Date: 02/17/2023   History of Present Illness Pt is a 77 y/o M admitted on 02/16/23 after presenting with c/o episode of dizziness, L arm heaviness, & lowering himself to the ground to prevent a fall. Suspect pt's symptoms are 2/2 orthostatic hypotension in the setting of diarrheal illness-diuretic use. 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 He completes is ADLs with setup assist in sitting, recalls 3/5 signs of strokes before receiving formal education. He seemed concerned that he had a TIA. Pt has no further acute skilled OT needs but would benefit from frequent mobility with mobility specialists. Anticipate no post acute follow-up needed.        If plan is discharge home, recommend the following: Assist for transportation    Functional Status Assessment  Patient has not had a recent decline in their functional status  Equipment Recommendations  None recommended by OT    Recommendations for Other Services       Precautions / Restrictions Precautions Precautions: None Precaution Comments: Rt BKA w/ prosthetic Restrictions Weight Bearing Restrictions: No      Mobility Bed Mobility Overal bed mobility: Modified Independent             General bed mobility comments: increased time, left pt sitting in recliner. used bed rails prn    Transfers Overall transfer level: Modified independent   Transfers: Sit to/from Stand Sit to Stand: Modified independent (Device/Increase time)           General transfer comment: Held on to supported surfaces during transfer, suggest Rollator use      Balance Overall balance assessment: Needs assistance Sitting-balance support: No upper  extremity supported, Feet supported Sitting balance-Leahy Scale: Good     Standing balance support: Bilateral upper extremity supported, During functional activity Standing balance-Leahy Scale: Fair                             ADL either performed or assessed with clinical judgement   ADL Overall ADL's : Needs assistance/impaired Eating/Feeding: Independent;Sitting   Grooming: Set up;Sitting;Wash/dry face;Oral care   Upper Body Bathing: Sitting;Set up   Lower Body Bathing: Set up;Sitting/lateral leans   Upper Body Dressing : Sitting;Set up   Lower Body Dressing: Sitting/lateral leans;Set up   Toilet Transfer: Modified Independent;Stand-pivot Toilet Transfer Details (indicate cue type and reason): sim with recliner, no AD Toileting- Clothing Manipulation and Hygiene: Sit to/from stand;Modified independent         General ADL Comments: Would be better with AD for transfers, educated pt on stroke signs/symptoms (BE FAST)     Vision Baseline Vision/History: 1 Wears glasses Patient Visual Report: No change from baseline Vision Assessment?: Yes;No apparent visual deficits     Perception         Praxis         Pertinent Vitals/Pain Pain Assessment Pain Assessment: No/denies pain     Extremity/Trunk Assessment Upper Extremity Assessment Upper Extremity Assessment: Overall WFL for tasks assessed   Lower Extremity Assessment Lower Extremity Assessment: Overall WFL for tasks assessed;RLE deficits/detail RLE Deficits / Details: pt with hx of amputation, has prosthetic donned during session   Cervical / Trunk Assessment Cervical / Trunk Assessment: Normal  Communication Communication Communication: No apparent difficulties;Other (comment) (has a thicker african accent)   Cognition Arousal: Alert Behavior During Therapy: WFL for tasks assessed/performed Overall Cognitive Status: Within Functional Limits for tasks assessed                                  General Comments: Pleasant, motivated to participate.     General Comments  VSS on RA    Exercises     Shoulder Instructions      Home Living Family/patient expects to be discharged to:: Private residence Living Arrangements: Alone Available Help at Discharge: Family;Available PRN/intermittently Type of Home: Apartment Home Access: Level entry     Home Layout: One level     Bathroom Shower/Tub: Chief Strategy Officer: Standard     Home Equipment: Rollator (4 wheels);Wheelchair - manual;Tub bench;Cane - single Librarian, academic (2 wheels)   Additional Comments: Home setup and PLOF from Prior admission, no change      Prior Functioning/Environment Prior Level of Function : Independent/Modified Independent;Driving             Mobility Comments: ambulatory with walking stick or rollator ADLs Comments: Mod i for ADLs        OT Problem List: Impaired balance (sitting and/or standing);Other (comment) (syncope)      OT Treatment/Interventions:      OT Goals(Current goals can be found in the care plan section) Acute Rehab OT Goals Patient Stated Goal: TO go home OT Goal Formulation: With patient Time For Goal Achievement: 03/03/23 Potential to Achieve Goals: Good  OT Frequency:      Co-evaluation              AM-PAC OT "6 Clicks" Daily Activity     Outcome Measure Help from another person eating meals?: None Help from another person taking care of personal grooming?: A Little Help from another person toileting, which includes using toliet, bedpan, or urinal?: A Little Help from another person bathing (including washing, rinsing, drying)?: A Little Help from another person to put on and taking off regular upper body clothing?: A Little Help from another person to put on and taking off regular lower body clothing?: A Little 6 Click Score: 19   End of Session Equipment Utilized During Treatment: Gait belt Nurse  Communication: Mobility status  Activity Tolerance: Patient tolerated treatment well Patient left: in chair;with call bell/phone within reach;with chair alarm set;Other (comment) (PT in room)  OT Visit Diagnosis: History of falling (Z91.81);Unsteadiness on feet (R26.81)                Time: 8119-1478 OT Time Calculation (min): 29 min Charges:  OT General Charges $OT Visit: 1 Visit OT Evaluation $OT Eval Moderate Complexity: 1 Mod OT Treatments $Self Care/Home Management : 8-22 mins  02/17/2023  AB, OTR/L  Acute Rehabilitation Services  Office: [phone]   Tristan Schroeder 02/17/2023, 1:31 PM

## 2023-02-17 NOTE — Evaluation (Signed)
Physical Therapy Evaluation Patient Details Name: Cory [name] MRN: [id number] DOB: 1945-10-09 Today's Date: 02/17/2023  History of Present Illness  Pt is a 77 y/o M admitted on 02/16/23 after presenting with c/o episode of dizziness, L arm heaviness, & lowering himself to the ground to prevent a fall. Suspect pt's symptoms are 2/2 orthostatic hypotension in the setting of diarrheal illness-diuretic use. 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 Pt reports prior to admission he was ambulatory with walking stick or rollator.  Pt already with RLE prosthesis donned during session. Pt is able to complete STS transfer from recliner with  mod I & ambulate in hallway with rollator & mod I, no LOB. Back in room, pt does ambulate ~5 ft without AD with slow, guarded gait but no LOB. Pt without c/o new deficits, states he's more or less at this baseline. At this time, pt does not require acute PT services. PT to complete current orders, please re-consult if new needs arise.      If plan is discharge home, recommend the following:     Can travel by private vehicle        Equipment Recommendations None recommended by PT  Recommendations for Other Services       Functional Status Assessment Patient has not had a recent decline in their functional status     Precautions / Restrictions Precautions Precautions: None Precaution Comments: Rt BKA w/ prosthetic Restrictions Weight Bearing Restrictions: No      Mobility  Bed Mobility               General bed mobility comments: not tested, pt received & left sitting in recliner    Transfers Overall transfer level: Modified independent Equipment used: Rollator (4 wheels) Transfers: Sit to/from Stand Sit to Stand: Modified independent (Device/Increase time)                Ambulation/Gait Ambulation/Gait assistance: Modified independent (Device/Increase  time) Gait Distance (Feet):  (>200 ft) Assistive device: Rollator (4 wheels) Gait Pattern/deviations: Decreased stride length Gait velocity: WNL     General Gait Details: slightly decreased R hip flexion/foot clearance  Stairs            Wheelchair Mobility     Tilt Bed    Modified Rankin (Stroke Patients Only)       Balance Overall balance assessment: Needs assistance Sitting-balance support: No upper extremity supported, Feet supported Sitting balance-Leahy Scale: Good     Standing balance support: Bilateral upper extremity supported, During functional activity Standing balance-Leahy Scale: Good                               Pertinent Vitals/Pain Pain Assessment Pain Assessment: No/denies pain    Home Living Family/patient expects to be discharged to:: Private residence Living Arrangements: Alone Available Help at Discharge: Family;Available PRN/intermittently Type of Home: Apartment Home Access: Level entry       Home Layout: One level Home Equipment: Rollator (4 wheels);Wheelchair - manual;Tub bench;Cane - single Librarian, academic (2 wheels) Additional Comments: Home setup and PLOF from Prior admission, no change    Prior Function Prior Level of Function : Independent/Modified Independent;Driving             Mobility Comments: ambulatory with walking stick or rollator ADLs Comments: Mod i for ADLs     Extremity/Trunk Assessment  Upper Extremity Assessment Upper Extremity Assessment: Overall WFL for tasks assessed    Lower Extremity Assessment Lower Extremity Assessment: Overall WFL for tasks assessed;RLE deficits/detail RLE Deficits / Details: pt with hx of amputation, has prosthetic donned during session    Cervical / Trunk Assessment Cervical / Trunk Assessment: Normal  Communication   Communication Communication: No apparent difficulties;Other (comment)  Cognition Arousal: Alert Behavior During Therapy: WFL for  tasks assessed/performed Overall Cognitive Status: Within Functional Limits for tasks assessed                                 General Comments: Pleasant, motivated to participate.        General Comments      Exercises     Assessment/Plan    PT Assessment Patient does not need any further PT services  PT Problem List         PT Treatment Interventions      PT Goals (Current goals can be found in the Care Plan section)  Acute Rehab PT Goals Patient Stated Goal: none stated PT Goal Formulation: With patient Time For Goal Achievement: 03/03/23 Potential to Achieve Goals: Good    Frequency       Co-evaluation               AM-PAC PT "6 Clicks" Mobility  Outcome Measure Help needed turning from your back to your side while in a flat bed without using bedrails?: None Help needed moving from lying on your back to sitting on the side of a flat bed without using bedrails?: None Help needed moving to and from a bed to a chair (including a wheelchair)?: None Help needed standing up from a chair using your arms (e.g., wheelchair or bedside chair)?: None Help needed to walk in hospital room?: None Help needed climbing 3-5 steps with a railing? : None 6 Click Score: 24    End of Session Equipment Utilized During Treatment:  (RLE prosthetic) Activity Tolerance: Patient tolerated treatment well Patient left: with chair alarm set;in chair;with call bell/phone within reach Nurse Communication: Mobility status      Time: 2725-3664 PT Time Calculation (min) (ACUTE ONLY): 20 min   Charges:   PT Evaluation $PT Eval Low Complexity: 1 Low   PT General Charges $$ ACUTE PT VISIT: 1 Visit         Aleda Grana, PT, DPT 02/17/23, 12:33 PM   Sandi Mariscal 02/17/2023, 12:32 PM

## 2023-02-17 NOTE — Inpatient Diabetes Management (Addendum)
Inpatient Diabetes Program Recommendations  AACE/ADA: New Consensus Statement on Inpatient Glycemic Control (2015)  Target Ranges:  Prepandial:   less than 140 mg/dL      Peak postprandial:   less than 180 mg/dL (1-2 hours)      Critically ill patients:  140 - 180 mg/dL   Lab Results  Component Value Date   GLUCAP 271 (H) 02/17/2023   HGBA1C 14.0 (H) 12/27/2022    Review of Glycemic Control  Latest Reference Range & Units 02/16/23 21:33 02/17/23 08:20 02/17/23 12:02  Glucose-Capillary 70 - 99 mg/dL 782 (H) 956 (H) 213 (H)   Diabetes history: DM  Outpatient Diabetes medications:  Humalog 2-10 units tid Lantus 40 units daily Current orders for Inpatient glycemic control:  Novolog 0-9 units tid with meals and HS Semglee 20 units daily  Inpatient Diabetes Program Recommendations:    Consider adding Novolog meal coverage 3 units tid with meals (hold if patient eats less than 50%).  Of note, I spoke to this patient during last hospitalization and discussed elevated A1C.  Blood sugars do appear to better.    Thanks,  Beryl Meager, RN, BC-ADM Inpatient Diabetes Coordinator Pager [phone]  (8a-5p)

## 2023-02-18 ENCOUNTER — Inpatient Hospital Stay (HOSPITAL_COMMUNITY): Payer: Medicare HMO

## 2023-02-18 DIAGNOSIS — N400 Enlarged prostate without lower urinary tract symptoms: Secondary | ICD-10-CM

## 2023-02-18 DIAGNOSIS — R55 Syncope and collapse: Secondary | ICD-10-CM | POA: Diagnosis not present

## 2023-02-18 DIAGNOSIS — N179 Acute kidney failure, unspecified: Secondary | ICD-10-CM | POA: Diagnosis not present

## 2023-02-18 DIAGNOSIS — E876 Hypokalemia: Secondary | ICD-10-CM | POA: Diagnosis not present

## 2023-02-18 LAB — BASIC METABOLIC PANEL
Anion gap: 11 (ref 5–15)
Anion gap: 12 (ref 5–15)
BUN: 31 mg/dL — ABNORMAL HIGH (ref 8–23)
BUN: 27 mg/dL — ABNORMAL HIGH (ref 8–23)
CO2: 28 mmol/L (ref 22–32)
CO2: 28 mmol/L (ref 22–32)
Calcium: 8.9 mg/dL (ref 8.9–10.3)
Calcium: 8.9 mg/dL (ref 8.9–10.3)
Chloride: 96 mmol/L — ABNORMAL LOW (ref 98–111)
Chloride: 96 mmol/L — ABNORMAL LOW (ref 98–111)
Creatinine, Ser: 2.16 mg/dL — ABNORMAL HIGH (ref 0.61–1.24)
Creatinine, Ser: 2.3 mg/dL — ABNORMAL HIGH (ref 0.61–1.24)
GFR, Estimated: 31 mL/min — ABNORMAL LOW (ref >60)
GFR, Estimated: 29 mL/min — ABNORMAL LOW (ref >60)
Glucose, Bld: 99 mg/dL (ref 70–99)
Glucose, Bld: 183 mg/dL — ABNORMAL HIGH (ref 70–99)
Potassium: 2.7 mmol/L — CL (ref 3.5–5.1)
Potassium: 3.3 mmol/L — ABNORMAL LOW (ref 3.5–5.1)
Sodium: 135 mmol/L (ref 135–145)
Sodium: 136 mmol/L (ref 135–145)

## 2023-02-18 LAB — GLUCOSE, CAPILLARY
Glucose-Capillary: 74 mg/dL (ref 70–99)
Glucose-Capillary: 158 mg/dL — ABNORMAL HIGH (ref 70–99)
Glucose-Capillary: 194 mg/dL — ABNORMAL HIGH (ref 70–99)
Glucose-Capillary: 206 mg/dL — ABNORMAL HIGH (ref 70–99)

## 2023-02-18 LAB — LIPID PANEL
Cholesterol: 134 mg/dL (ref 0–200)
HDL: 49 mg/dL (ref >40)
LDL Cholesterol: 69 mg/dL (ref 0–99)
Total CHOL/HDL Ratio: 2.7 {ratio}
Triglycerides: 80 mg/dL (ref <150)
VLDL: 16 mg/dL (ref 0–40)

## 2023-02-18 LAB — MAGNESIUM: Magnesium: 2.1 mg/dL (ref 1.7–2.4)

## 2023-02-18 MED ORDER — POTASSIUM CHLORIDE 10 MEQ/100ML IV SOLN
10.0000 meq | INTRAVENOUS | Status: AC
  Administered 2023-02-18 (×4): 10 meq via INTRAVENOUS
  Filled 2023-02-18 (×4): qty 100

## 2023-02-18 MED ORDER — POTASSIUM CHLORIDE 20 MEQ PO PACK
40.0000 meq | PACK | ORAL | Status: AC
  Administered 2023-02-18 (×2): 40 meq via ORAL
  Filled 2023-02-18 (×2): qty 2

## 2023-02-18 MED ORDER — POTASSIUM CHLORIDE 20 MEQ PO PACK
40.0000 meq | PACK | Freq: Two times a day (BID) | ORAL | Status: DC
  Administered 2023-02-18: 40 meq via ORAL
  Filled 2023-02-18: qty 2

## 2023-02-18 NOTE — Plan of Care (Signed)
Problem: Education: Goal: Knowledge of General Education information will improve Description: Including pain rating scale, medication(s)/side effects and non-pharmacologic comfort measures Outcome: Progressing   Problem: Clinical Measurements: Goal: Will remain free from infection Outcome: Progressing Goal: Cardiovascular complication will be avoided Outcome: Progressing   Problem: Activity: Goal: Risk for activity intolerance will decrease Outcome: Progressing   Problem: Elimination: Goal: Will not experience complications related to bowel motility Outcome: Progressing

## 2023-02-18 NOTE — Progress Notes (Signed)
APPEARANCEUR HAZY (A) 02/16/2023 1444   LABSPEC 1.010 02/16/2023 1444   PHURINE 6.0 02/16/2023 1444   GLUCOSEU NEGATIVE 02/16/2023 1444   HGBUR SMALL (A) 02/16/2023 1444   BILIRUBINUR NEGATIVE 02/16/2023 1444   KETONESUR NEGATIVE 02/16/2023 1444   PROTEINUR 30 (A) 02/16/2023 1444   NITRITE NEGATIVE 02/16/2023 1444   LEUKOCYTESUR SMALL (A) 02/16/2023 1444    Sepsis Labs: Lactic Acid, Venous    Component Value Date/Time   LATICACIDVEN 1.5 12/26/2022 1558    MICROBIOLOGY: No results found for this or any previous visit (from the past 240 hour(s)).  RADIOLOGY STUDIES/RESULTS: ECHOCARDIOGRAM COMPLETE BUBBLE STUDY  Result Date: 02/17/2023    ECHOCARDIOGRAM REPORT   Patient Name:   Cory [name] Date of Exam: 02/17/2023 Medical Rec #:  [id number]    Height:       71.0 in Accession #:    [id number]   Weight:       195.0 lb Date of Birth:  [birth year]    BSA:          2.086 m Patient Age:    76 years     BP:           143/97 mmHg Patient Gender: M            HR:           84 bpm. Exam Location:  Inpatient Procedure: 2D Echo, Cardiac Doppler, Color Doppler and Saline Contrast Bubble            Study Indications:    Stroke 434.91/ I63.9  History:        Patient has prior history of Echocardiogram examinations, most                 recent 12/27/2022. Arrythmias:Atrial Flutter,                 Signs/Symptoms:Syncope; Risk Factors:Hypertension, Diabetes and                 Dyslipidemia.  Sonographer:     Lucendia Herrlich RCS Referring Phys: Lorin Glass IMPRESSIONS  1. Left ventricular ejection fraction, by estimation, is 55 to 60%. The left ventricle has normal function. The left ventricle has no regional wall motion abnormalities. There is mild left ventricular hypertrophy. Left ventricular diastolic function could not be evaluated.  2. Right ventricular systolic function is normal. The right ventricular size is normal. There is normal pulmonary artery systolic pressure.  3. The mitral valve is normal in structure. Trivial mitral valve regurgitation. No evidence of mitral stenosis.  4. The aortic valve is tricuspid. Aortic valve regurgitation is not visualized. No aortic stenosis is present.  5. The inferior vena cava is normal in size with greater than 50% respiratory variability, suggesting right atrial pressure of 3 mmHg.  6. Agitated saline contrast bubble study was negative, with no evidence of any interatrial shunt. Conclusion(s)/Recommendation(s): No intracardiac source of embolism detected on this transthoracic study. Consider a transesophageal echocardiogram to exclude cardiac source of embolism if clinically indicated. FINDINGS  Left Ventricle: Left ventricular ejection fraction, by estimation, is 55 to 60%. The left ventricle has normal function. The left ventricle has no regional wall motion abnormalities. The left ventricular internal cavity size was normal in size. There is  mild left ventricular hypertrophy. Left ventricular diastolic function could not be evaluated. Right Ventricle: The right ventricular size is normal. No increase in right ventricular wall thickness. Right ventricular systolic function is normal. There is normal pulmonary artery systolic pressure. The  APPEARANCEUR HAZY (A) 02/16/2023 1444   LABSPEC 1.010 02/16/2023 1444   PHURINE 6.0 02/16/2023 1444   GLUCOSEU NEGATIVE 02/16/2023 1444   HGBUR SMALL (A) 02/16/2023 1444   BILIRUBINUR NEGATIVE 02/16/2023 1444   KETONESUR NEGATIVE 02/16/2023 1444   PROTEINUR 30 (A) 02/16/2023 1444   NITRITE NEGATIVE 02/16/2023 1444   LEUKOCYTESUR SMALL (A) 02/16/2023 1444    Sepsis Labs: Lactic Acid, Venous    Component Value Date/Time   LATICACIDVEN 1.5 12/26/2022 1558    MICROBIOLOGY: No results found for this or any previous visit (from the past 240 hour(s)).  RADIOLOGY STUDIES/RESULTS: ECHOCARDIOGRAM COMPLETE BUBBLE STUDY  Result Date: 02/17/2023    ECHOCARDIOGRAM REPORT   Patient Name:   Cory [name] Date of Exam: 02/17/2023 Medical Rec #:  [id number]    Height:       71.0 in Accession #:    [id number]   Weight:       195.0 lb Date of Birth:  [birth year]    BSA:          2.086 m Patient Age:    76 years     BP:           143/97 mmHg Patient Gender: M            HR:           84 bpm. Exam Location:  Inpatient Procedure: 2D Echo, Cardiac Doppler, Color Doppler and Saline Contrast Bubble            Study Indications:    Stroke 434.91/ I63.9  History:        Patient has prior history of Echocardiogram examinations, most                 recent 12/27/2022. Arrythmias:Atrial Flutter,                 Signs/Symptoms:Syncope; Risk Factors:Hypertension, Diabetes and                 Dyslipidemia.  Sonographer:     Lucendia Herrlich RCS Referring Phys: Lorin Glass IMPRESSIONS  1. Left ventricular ejection fraction, by estimation, is 55 to 60%. The left ventricle has normal function. The left ventricle has no regional wall motion abnormalities. There is mild left ventricular hypertrophy. Left ventricular diastolic function could not be evaluated.  2. Right ventricular systolic function is normal. The right ventricular size is normal. There is normal pulmonary artery systolic pressure.  3. The mitral valve is normal in structure. Trivial mitral valve regurgitation. No evidence of mitral stenosis.  4. The aortic valve is tricuspid. Aortic valve regurgitation is not visualized. No aortic stenosis is present.  5. The inferior vena cava is normal in size with greater than 50% respiratory variability, suggesting right atrial pressure of 3 mmHg.  6. Agitated saline contrast bubble study was negative, with no evidence of any interatrial shunt. Conclusion(s)/Recommendation(s): No intracardiac source of embolism detected on this transthoracic study. Consider a transesophageal echocardiogram to exclude cardiac source of embolism if clinically indicated. FINDINGS  Left Ventricle: Left ventricular ejection fraction, by estimation, is 55 to 60%. The left ventricle has normal function. The left ventricle has no regional wall motion abnormalities. The left ventricular internal cavity size was normal in size. There is  mild left ventricular hypertrophy. Left ventricular diastolic function could not be evaluated. Right Ventricle: The right ventricular size is normal. No increase in right ventricular wall thickness. Right ventricular systolic function is normal. There is normal pulmonary artery systolic pressure. The  PROGRESS NOTE        PATIENT DETAILS Name: Cory [name] Age: 77 y.o. Sex: male Date of Birth: 12-12-1945 Admit Date: 02/16/2023 Admitting Physician Lorin Glass, MD MVH:QIONGE, [name], MD  Brief Summary: Patient is a 77 y.o.  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  Per patient-for 2 days prior to admission-he has been having diarrhea-and having up to 6 bowel movements a day.  Significant events: 10/10>> admit to [facility]  Significant studies: 8/20>> A1c 14.0 10/10>> CT head: No acute intracranial abnormality 10/10>> MRI brain: No acute CVA. 10/11>> LDL: 69 10/11>> echo: EF 55-60%  Significant microbiology data: None  Procedures: None  Consults: None  Subjective: No diarrhea-had only 1 BM yesterday.  Lying comfortably in bed.  No complaints.  Objective: Vitals: Blood pressure (!) 136/96, pulse 66, temperature 97.7 F (36.5 C), temperature source Oral, resp. rate 18, height 5\' 11"  (1.803 m), weight 88.5 kg, SpO2 95%.   Exam: Gen Exam:Alert awake-not in any distress HEENT:atraumatic, normocephalic Chest: B/L clear to auscultation anteriorly CVS:S1S2 regular Abdomen:soft non tender, non distended Extremities: Right BKA Neurology: Non focal Skin: no rash  Pertinent Labs/Radiology:    Latest Ref Rng & Units 02/17/2023    3:27 AM 02/16/2023    2:29 PM 02/16/2023    1:29 PM  CBC  WBC 4.0 - 10.5 K/uL 5.0   5.2   Hemoglobin 13.0 - 17.0 g/dL 95.2  84.1  32.4   Hematocrit 39.0 - 52.0 % 36.5  43.0  39.0   Platelets 150 - 400 K/uL 217   229     Lab Results  Component Value Date   NA 135 02/18/2023   K 2.7 (LL) 02/18/2023   CL 96 (L) 02/18/2023   CO2 28  02/18/2023     Assessment/Plan: Presyncope/transient left arm heaviness for a few seconds Suspect this is orthostatic hypotension-in the setting of diarrheal illness-diuretic use Unclear whether orthostatic vital signs was done yesterday-will discuss with RN Doubt TIA-but getting a workup nonetheless (echo/carotid pending-lipids ordered for tomorrow) Thankfully diarrhea has essentially resolved. Continue to mobilize with PT/OT/nursing staff.  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   Hypokalemia Due to diarrheal illness/diuretic use Being repleted aggressively today-recheck later this afternoon and again tomorrow morning.  Persistent atrial flutter Rate controlled Metoprolol/Eliquis Follows with cardiology at [facility]  DM-2 (A1c 14 on 8/20) with uncontrolled hyperglycemia/poor long-term control CBG improving Semglee 26 units + SSI Reassess 10/13   Recent Labs    02/17/23 1635 02/17/23 2140 02/18/23 0810  GLUCAP 419* 289* 74      HTN BP controlled with metoprolol   HLD Statin   BPH Flomax   Debility/deconditioning PT/OT eval appreciated-likely home health on discharge  BMI: Estimated body mass index is 27.2 kg/m as calculated from the following:   Height as of this encounter: 5\' 11"  (1.803 m).   Weight as of this encounter: 88.5 kg.   Code status:   Code Status: Full Code  APPEARANCEUR HAZY (A) 02/16/2023 1444   LABSPEC 1.010 02/16/2023 1444   PHURINE 6.0 02/16/2023 1444   GLUCOSEU NEGATIVE 02/16/2023 1444   HGBUR SMALL (A) 02/16/2023 1444   BILIRUBINUR NEGATIVE 02/16/2023 1444   KETONESUR NEGATIVE 02/16/2023 1444   PROTEINUR 30 (A) 02/16/2023 1444   NITRITE NEGATIVE 02/16/2023 1444   LEUKOCYTESUR SMALL (A) 02/16/2023 1444    Sepsis Labs: Lactic Acid, Venous    Component Value Date/Time   LATICACIDVEN 1.5 12/26/2022 1558    MICROBIOLOGY: No results found for this or any previous visit (from the past 240 hour(s)).  RADIOLOGY STUDIES/RESULTS: ECHOCARDIOGRAM COMPLETE BUBBLE STUDY  Result Date: 02/17/2023    ECHOCARDIOGRAM REPORT   Patient Name:   Cory [name] Date of Exam: 02/17/2023 Medical Rec #:  [id number]    Height:       71.0 in Accession #:    [id number]   Weight:       195.0 lb Date of Birth:  [birth year]    BSA:          2.086 m Patient Age:    76 years     BP:           143/97 mmHg Patient Gender: M            HR:           84 bpm. Exam Location:  Inpatient Procedure: 2D Echo, Cardiac Doppler, Color Doppler and Saline Contrast Bubble            Study Indications:    Stroke 434.91/ I63.9  History:        Patient has prior history of Echocardiogram examinations, most                 recent 12/27/2022. Arrythmias:Atrial Flutter,                 Signs/Symptoms:Syncope; Risk Factors:Hypertension, Diabetes and                 Dyslipidemia.  Sonographer:     Lucendia Herrlich RCS Referring Phys: Lorin Glass IMPRESSIONS  1. Left ventricular ejection fraction, by estimation, is 55 to 60%. The left ventricle has normal function. The left ventricle has no regional wall motion abnormalities. There is mild left ventricular hypertrophy. Left ventricular diastolic function could not be evaluated.  2. Right ventricular systolic function is normal. The right ventricular size is normal. There is normal pulmonary artery systolic pressure.  3. The mitral valve is normal in structure. Trivial mitral valve regurgitation. No evidence of mitral stenosis.  4. The aortic valve is tricuspid. Aortic valve regurgitation is not visualized. No aortic stenosis is present.  5. The inferior vena cava is normal in size with greater than 50% respiratory variability, suggesting right atrial pressure of 3 mmHg.  6. Agitated saline contrast bubble study was negative, with no evidence of any interatrial shunt. Conclusion(s)/Recommendation(s): No intracardiac source of embolism detected on this transthoracic study. Consider a transesophageal echocardiogram to exclude cardiac source of embolism if clinically indicated. FINDINGS  Left Ventricle: Left ventricular ejection fraction, by estimation, is 55 to 60%. The left ventricle has normal function. The left ventricle has no regional wall motion abnormalities. The left ventricular internal cavity size was normal in size. There is  mild left ventricular hypertrophy. Left ventricular diastolic function could not be evaluated. Right Ventricle: The right ventricular size is normal. No increase in right ventricular wall thickness. Right ventricular systolic function is normal. There is normal pulmonary artery systolic pressure. The  APPEARANCEUR HAZY (A) 02/16/2023 1444   LABSPEC 1.010 02/16/2023 1444   PHURINE 6.0 02/16/2023 1444   GLUCOSEU NEGATIVE 02/16/2023 1444   HGBUR SMALL (A) 02/16/2023 1444   BILIRUBINUR NEGATIVE 02/16/2023 1444   KETONESUR NEGATIVE 02/16/2023 1444   PROTEINUR 30 (A) 02/16/2023 1444   NITRITE NEGATIVE 02/16/2023 1444   LEUKOCYTESUR SMALL (A) 02/16/2023 1444    Sepsis Labs: Lactic Acid, Venous    Component Value Date/Time   LATICACIDVEN 1.5 12/26/2022 1558    MICROBIOLOGY: No results found for this or any previous visit (from the past 240 hour(s)).  RADIOLOGY STUDIES/RESULTS: ECHOCARDIOGRAM COMPLETE BUBBLE STUDY  Result Date: 02/17/2023    ECHOCARDIOGRAM REPORT   Patient Name:   Cory [name] Date of Exam: 02/17/2023 Medical Rec #:  [id number]    Height:       71.0 in Accession #:    [id number]   Weight:       195.0 lb Date of Birth:  [birth year]    BSA:          2.086 m Patient Age:    76 years     BP:           143/97 mmHg Patient Gender: M            HR:           84 bpm. Exam Location:  Inpatient Procedure: 2D Echo, Cardiac Doppler, Color Doppler and Saline Contrast Bubble            Study Indications:    Stroke 434.91/ I63.9  History:        Patient has prior history of Echocardiogram examinations, most                 recent 12/27/2022. Arrythmias:Atrial Flutter,                 Signs/Symptoms:Syncope; Risk Factors:Hypertension, Diabetes and                 Dyslipidemia.  Sonographer:     Lucendia Herrlich RCS Referring Phys: Lorin Glass IMPRESSIONS  1. Left ventricular ejection fraction, by estimation, is 55 to 60%. The left ventricle has normal function. The left ventricle has no regional wall motion abnormalities. There is mild left ventricular hypertrophy. Left ventricular diastolic function could not be evaluated.  2. Right ventricular systolic function is normal. The right ventricular size is normal. There is normal pulmonary artery systolic pressure.  3. The mitral valve is normal in structure. Trivial mitral valve regurgitation. No evidence of mitral stenosis.  4. The aortic valve is tricuspid. Aortic valve regurgitation is not visualized. No aortic stenosis is present.  5. The inferior vena cava is normal in size with greater than 50% respiratory variability, suggesting right atrial pressure of 3 mmHg.  6. Agitated saline contrast bubble study was negative, with no evidence of any interatrial shunt. Conclusion(s)/Recommendation(s): No intracardiac source of embolism detected on this transthoracic study. Consider a transesophageal echocardiogram to exclude cardiac source of embolism if clinically indicated. FINDINGS  Left Ventricle: Left ventricular ejection fraction, by estimation, is 55 to 60%. The left ventricle has normal function. The left ventricle has no regional wall motion abnormalities. The left ventricular internal cavity size was normal in size. There is  mild left ventricular hypertrophy. Left ventricular diastolic function could not be evaluated. Right Ventricle: The right ventricular size is normal. No increase in right ventricular wall thickness. Right ventricular systolic function is normal. There is normal pulmonary artery systolic pressure. The

## 2023-02-18 NOTE — Progress Notes (Signed)
Carotid duplex has been completed.   Results can be found under chart review under CV PROC. 02/18/2023 3:07 PM Nera Haworth RVT, RDMS

## 2023-02-18 NOTE — Progress Notes (Signed)
Hypokalemia: Patient is nurse reporting that labs showing potassium 2.7 - Replating with oral potassium 40 mEq and IV potassium 10 x 4 mEq.  Tereasa Coop, MD [facility] 02/18/2023, 3:53 AM

## 2023-02-19 DIAGNOSIS — E1165 Type 2 diabetes mellitus with hyperglycemia: Secondary | ICD-10-CM | POA: Diagnosis not present

## 2023-02-19 DIAGNOSIS — R55 Syncope and collapse: Secondary | ICD-10-CM | POA: Diagnosis not present

## 2023-02-19 DIAGNOSIS — E876 Hypokalemia: Secondary | ICD-10-CM | POA: Diagnosis not present

## 2023-02-19 LAB — BASIC METABOLIC PANEL
Anion gap: 12 (ref 5–15)
BUN: 27 mg/dL — ABNORMAL HIGH (ref 8–23)
CO2: 25 mmol/L (ref 22–32)
Calcium: 8.9 mg/dL (ref 8.9–10.3)
Chloride: 101 mmol/L (ref 98–111)
Creatinine, Ser: 2.24 mg/dL — ABNORMAL HIGH (ref 0.61–1.24)
GFR, Estimated: 30 mL/min — ABNORMAL LOW (ref >60)
Glucose, Bld: 196 mg/dL — ABNORMAL HIGH (ref 70–99)
Potassium: 3.3 mmol/L — ABNORMAL LOW (ref 3.5–5.1)
Sodium: 138 mmol/L (ref 135–145)

## 2023-02-19 LAB — GLUCOSE, CAPILLARY: Glucose-Capillary: 209 mg/dL — ABNORMAL HIGH (ref 70–99)

## 2023-02-19 MED ORDER — POTASSIUM CHLORIDE CRYS ER 10 MEQ PO TBCR: 20.0000 meq | EXTENDED_RELEASE_TABLET | Freq: Every day | ORAL | 0 refills | Status: DC

## 2023-02-19 MED ORDER — POTASSIUM CHLORIDE CRYS ER 20 MEQ PO TBCR
40.0000 meq | EXTENDED_RELEASE_TABLET | Freq: Once | ORAL | Status: AC
  Administered 2023-02-19: 40 meq via ORAL
  Filled 2023-02-19: qty 2

## 2023-02-19 MED ORDER — LANTUS SOLOSTAR 100 UNIT/ML ~~LOC~~ SOPN: 26.0000 [IU] | PEN_INJECTOR | Freq: Every day | SUBCUTANEOUS | Status: DC

## 2023-02-19 NOTE — TOC Transition Note (Signed)
Transition of Care [facility]) - CM/SW Discharge Note   Patient Details  Name: Cory [name] MRN: [id number] Date of Birth: [birth year]  [facility] CM/SW Contact:  Lawerance Sabal, RN Phone Number: 02/19/2023, 9:49 AM   Clinical Narrative:    DC planning reviewed with patient's niece [name]. She is agreeable to restart [facility] services with [facility]. Liaison has been notified and case is accepted for [facility] this week.  No DME needs identified for DC.  [name] asked that nursing call her to set up time for DC, this requested has been communicated to nurse via secure chat.    [email or web address] (Relative) [phone]    Final next level of care: Home w Home Health Services Barriers to Discharge: No Barriers Identified   Patient Goals and CMS Choice CMS Medicare.gov Compare Post Acute Care list provided to:: Patient Represenative (must comment) Choice offered to / list presented to :  (neice [name])  Discharge Placement                         Discharge Plan and Services Additional resources added to the After Visit Summary for                            [facility] Arranged: PT, OT, Nurse's Aide HH Agency: [facility] Date [facility] Agency Contacted: 02/19/23 Time HH Agency Contacted: [phone] Representative spoke with at [facility] Agency: Rivka Barbara  Social Determinants of Health (SDOH) Interventions SDOH Screenings   Food Insecurity: Patient Declined (02/16/2023)  Housing: Patient Declined (02/16/2023)  Transportation Needs: Patient Declined (02/16/2023)  Utilities: Patient Declined (02/16/2023)  Social Connections: Unknown (09/21/2021)   Received from [facility], [facility]  Tobacco Use: Low Risk  (02/16/2023)     Readmission Risk Interventions     No data to display

## 2023-02-19 NOTE — Discharge Summary (Signed)
US CAROTID  Result Date: 02/19/2023 Carotid Arterial Duplex Study Patient Name:  Cory [name]  Date of Exam:   02/18/2023 Medical Rec #: [id number]     Accession #:    [id number] Date of Birth: [birth year]     Patient Gender: M Patient Age:   77 years Exam Location:  [facility] [place] Procedure:      VAS US CAROTID Referring Phys: Lorin Glass --------------------------------------------------------------------------------  Indications:       Syncope. Risk Factors:      Hypertension, hyperlipidemia, Diabetes, no history of                    smoking. Comparison Study:  No previous exams Performing Technologist: Jody Hill RVT, RDMS  Examination Guidelines: A complete evaluation includes B-mode imaging, spectral Doppler, color Doppler, and power Doppler as needed of all accessible portions of each vessel. Bilateral testing is considered an integral part of a complete examination. Limited examinations for reoccurring indications may be performed as noted.  Right Carotid Findings: +----------+--------+--------+--------+------------------+--------+           PSV cm/sEDV cm/sStenosisPlaque DescriptionComments +----------+--------+--------+--------+------------------+--------+ CCA Prox  76       11                                         +----------+--------+--------+--------+------------------+--------+ CCA Distal53      10                                         +----------+--------+--------+--------+------------------+--------+ ICA Prox  35      10                                         +----------+--------+--------+--------+------------------+--------+ ICA Distal39      11                                         +----------+--------+--------+--------+------------------+--------+ ECA       50      4                                          +----------+--------+--------+--------+------------------+--------+ +----------+--------+-------+----------------+-------------------+           PSV cm/sEDV cmsDescribe        Arm Pressure (mmHG) +----------+--------+-------+----------------+-------------------+ [id number]             Multiphasic, WNL                    +----------+--------+-------+----------------+-------------------+ +---------+--------+--+--------+-+---------+ VertebralPSV cm/s18EDV cm/s6Antegrade +---------+--------+--+--------+-+---------+  Left Carotid Findings: +----------+--------+--------+--------+------------------+------------------+           PSV cm/sEDV cm/sStenosisPlaque DescriptionComments           +----------+--------+--------+--------+------------------+------------------+ CCA Prox  93      14                                                   +----------+--------+--------+--------+------------------+------------------+  PATIENT DETAILS Name: Cory [name] Age: 77 y.o. Sex: male Date of Birth: 13-Jan-1946 MRN: [id number]. Admitting Physician: Lorin Glass, MD WUJ:[id number], Cory [name]., MD  Admit Date: 02/16/2023 Discharge date: 02/19/2023  Recommendations for Outpatient Follow-up:  Follow up with PCP in 1-2 weeks Please obtain CMP/CBC in one week Please ensure follow-up with cardiology/nephrology  Admitted From:  Home  Disposition: Home with home health services   Discharge Condition: good  CODE STATUS:   Code Status: Full Code   Diet recommendation:  Diet Order             Diet - low sodium heart healthy           Diet Carb Modified           Diet heart healthy/carb modified Room service appropriate? Yes; Fluid consistency: Thin  Diet effective now                    Brief Summary: Patient is a 77 y.o.  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  Per patient-for 2 days prior to admission-he has been having diarrhea-and having up to 6 bowel movements a day.   Significant events: 10/10>> admit to [facility]   Significant studies: 8/20>> A1c 14.0 10/10>> CT head: No acute intracranial abnormality 10/10>> MRI brain: No acute CVA. 10/11>> LDL: 69 10/11>> echo: EF 55-60% 10/12>> carotid Doppler: No significant stenosis   Significant microbiology data: None   Procedures: None   Consults: None  Brief Hospital Course: Presyncope/transient left arm heaviness for a few seconds Suspect this is orthostatic hypotension-in the setting of diarrheal illness-diuretic use Doubt TIA-but getting a workup nonetheless (echo/carotid pending-lipids ordered for tomorrow) Thankfully diarrhea has essentially  resolved. He apparently has mobilized with nursing/physical therapy without any major issues Plan is to discharge home today with home health services.   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  Please ensure patient has follow-up with outpatient nephrology.   Hypokalemia Due to diarrheal illness/diuretic use Will be repleted prior to discharge-no mild Will continue potassium supplementation on discharge-PCP to repeat electrolytes in 1 week.   Persistent atrial flutter Rate controlled Metoprolol/Eliquis Follows with cardiology at [facility] center  Chronic HFpEF Euvolemic Continue Demadex on discharge-adding potassium supplementation Holding metolazone-until seen by outpatient cardiology   DM-2 (A1c 14 on 8/20) with uncontrolled hyperglycemia/poor long-term control CBG stable overnight Continue Semglee 26 units/SSI on discharge (on home regimen is 40 units of Lantus Monitor CBGs and further optimization will be deferred to outpatient setting.  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   Weight as of this encounter: 88.5 kg.   Note-spoke with niece came on the day of discharge.  Discharge Diagnoses:  Principal Problem:   Syncope   Discharge Instructions:  Activity:  As tolerated with Full fall precautions use walker/cane & assistance  VertebralPSV cm/s24EDV cm/s6Antegrade +---------+--------+--+--------+-+---------+   Summary: Right Carotid: The extracranial vessels were near-normal with only minimal wall                thickening or plaque. Left Carotid: The extracranial vessels were near-normal with only minimal wall               thickening or plaque. Vertebrals:  Bilateral vertebral arteries demonstrate antegrade flow. Subclavians: Normal flow hemodynamics were seen in bilateral subclavian              arteries. *See table(s) above for measurements and observations.  Electronically signed by Carolynn Sayers on 02/19/2023 at 7:23:52 AM.    Final    ECHOCARDIOGRAM COMPLETE BUBBLE STUDY  Result Date: 02/17/2023    ECHOCARDIOGRAM REPORT   Patient Name:   Cory [name] Date of Exam: 02/17/2023 Medical Rec #:  [id number]    Height:       71.0 in Accession #:    [id number]   Weight:       195.0 lb Date of Birth:  1945-06-13    BSA:          2.086 m Patient Age:    76 years      BP:           143/97 mmHg Patient Gender: M            HR:           84 bpm. Exam Location:  Inpatient Procedure: 2D Echo, Cardiac Doppler, Color Doppler and Saline Contrast Bubble            Study Indications:    Stroke 434.91/ I63.9  History:        Patient has prior history of Echocardiogram examinations, most                 recent 12/27/2022. Arrythmias:Atrial Flutter,                 Signs/Symptoms:Syncope; Risk Factors:Hypertension, Diabetes and                 Dyslipidemia.  Sonographer:    Lucendia Herrlich RCS Referring Phys: Lorin Glass IMPRESSIONS  1. Left ventricular ejection fraction, by estimation, is 55 to 60%. The left ventricle has normal function. The left ventricle has no regional wall motion abnormalities. There is mild left ventricular hypertrophy. Left ventricular diastolic function could not be evaluated.  2. Right ventricular systolic function is normal. The right ventricular size is normal. There is normal pulmonary artery systolic pressure.  3. The mitral valve is normal in structure. Trivial mitral valve regurgitation. No evidence of mitral stenosis.  4. The aortic valve is tricuspid. Aortic valve regurgitation is not visualized. No aortic stenosis is present.  5. The inferior vena cava is normal in size with greater than 50% respiratory variability, suggesting right atrial pressure of 3 mmHg.  6. Agitated saline contrast bubble study was negative, with no evidence of any interatrial shunt. Conclusion(s)/Recommendation(s): No intracardiac source of embolism detected on this transthoracic study. Consider a transesophageal echocardiogram to exclude cardiac source of embolism if clinically indicated. FINDINGS  Left Ventricle: Left ventricular ejection fraction, by estimation, is 55 to 60%. The left ventricle has normal function. The left ventricle has no regional wall motion abnormalities. The left ventricular internal cavity size was normal in size. There is  mild left ventricular  hypertrophy. Left ventricular diastolic function could not be evaluated. Right Ventricle: The right ventricular size is normal. No increase in  US CAROTID  Result Date: 02/19/2023 Carotid Arterial Duplex Study Patient Name:  Cory [name]  Date of Exam:   02/18/2023 Medical Rec #: [id number]     Accession #:    [id number] Date of Birth: [birth year]     Patient Gender: M Patient Age:   77 years Exam Location:  [facility] [place] Procedure:      VAS US CAROTID Referring Phys: Lorin Glass --------------------------------------------------------------------------------  Indications:       Syncope. Risk Factors:      Hypertension, hyperlipidemia, Diabetes, no history of                    smoking. Comparison Study:  No previous exams Performing Technologist: Jody Hill RVT, RDMS  Examination Guidelines: A complete evaluation includes B-mode imaging, spectral Doppler, color Doppler, and power Doppler as needed of all accessible portions of each vessel. Bilateral testing is considered an integral part of a complete examination. Limited examinations for reoccurring indications may be performed as noted.  Right Carotid Findings: +----------+--------+--------+--------+------------------+--------+           PSV cm/sEDV cm/sStenosisPlaque DescriptionComments +----------+--------+--------+--------+------------------+--------+ CCA Prox  76       11                                         +----------+--------+--------+--------+------------------+--------+ CCA Distal53      10                                         +----------+--------+--------+--------+------------------+--------+ ICA Prox  35      10                                         +----------+--------+--------+--------+------------------+--------+ ICA Distal39      11                                         +----------+--------+--------+--------+------------------+--------+ ECA       50      4                                          +----------+--------+--------+--------+------------------+--------+ +----------+--------+-------+----------------+-------------------+           PSV cm/sEDV cmsDescribe        Arm Pressure (mmHG) +----------+--------+-------+----------------+-------------------+ [id number]             Multiphasic, WNL                    +----------+--------+-------+----------------+-------------------+ +---------+--------+--+--------+-+---------+ VertebralPSV cm/s18EDV cm/s6Antegrade +---------+--------+--+--------+-+---------+  Left Carotid Findings: +----------+--------+--------+--------+------------------+------------------+           PSV cm/sEDV cm/sStenosisPlaque DescriptionComments           +----------+--------+--------+--------+------------------+------------------+ CCA Prox  93      14                                                   +----------+--------+--------+--------+------------------+------------------+  PATIENT DETAILS Name: Cory [name] Age: 77 y.o. Sex: male Date of Birth: 13-Jan-1946 MRN: [id number]. Admitting Physician: Lorin Glass, MD WUJ:[id number], Cory [name]., MD  Admit Date: 02/16/2023 Discharge date: 02/19/2023  Recommendations for Outpatient Follow-up:  Follow up with PCP in 1-2 weeks Please obtain CMP/CBC in one week Please ensure follow-up with cardiology/nephrology  Admitted From:  Home  Disposition: Home with home health services   Discharge Condition: good  CODE STATUS:   Code Status: Full Code   Diet recommendation:  Diet Order             Diet - low sodium heart healthy           Diet Carb Modified           Diet heart healthy/carb modified Room service appropriate? Yes; Fluid consistency: Thin  Diet effective now                    Brief Summary: Patient is a 77 y.o.  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  Per patient-for 2 days prior to admission-he has been having diarrhea-and having up to 6 bowel movements a day.   Significant events: 10/10>> admit to [facility]   Significant studies: 8/20>> A1c 14.0 10/10>> CT head: No acute intracranial abnormality 10/10>> MRI brain: No acute CVA. 10/11>> LDL: 69 10/11>> echo: EF 55-60% 10/12>> carotid Doppler: No significant stenosis   Significant microbiology data: None   Procedures: None   Consults: None  Brief Hospital Course: Presyncope/transient left arm heaviness for a few seconds Suspect this is orthostatic hypotension-in the setting of diarrheal illness-diuretic use Doubt TIA-but getting a workup nonetheless (echo/carotid pending-lipids ordered for tomorrow) Thankfully diarrhea has essentially  resolved. He apparently has mobilized with nursing/physical therapy without any major issues Plan is to discharge home today with home health services.   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  Please ensure patient has follow-up with outpatient nephrology.   Hypokalemia Due to diarrheal illness/diuretic use Will be repleted prior to discharge-no mild Will continue potassium supplementation on discharge-PCP to repeat electrolytes in 1 week.   Persistent atrial flutter Rate controlled Metoprolol/Eliquis Follows with cardiology at [facility] center  Chronic HFpEF Euvolemic Continue Demadex on discharge-adding potassium supplementation Holding metolazone-until seen by outpatient cardiology   DM-2 (A1c 14 on 8/20) with uncontrolled hyperglycemia/poor long-term control CBG stable overnight Continue Semglee 26 units/SSI on discharge (on home regimen is 40 units of Lantus Monitor CBGs and further optimization will be deferred to outpatient setting.  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   Weight as of this encounter: 88.5 kg.   Note-spoke with niece came on the day of discharge.  Discharge Diagnoses:  Principal Problem:   Syncope   Discharge Instructions:  Activity:  As tolerated with Full fall precautions use walker/cane & assistance  PATIENT DETAILS Name: Cory [name] Age: 77 y.o. Sex: male Date of Birth: 13-Jan-1946 MRN: [id number]. Admitting Physician: Lorin Glass, MD WUJ:[id number], Cory [name]., MD  Admit Date: 02/16/2023 Discharge date: 02/19/2023  Recommendations for Outpatient Follow-up:  Follow up with PCP in 1-2 weeks Please obtain CMP/CBC in one week Please ensure follow-up with cardiology/nephrology  Admitted From:  Home  Disposition: Home with home health services   Discharge Condition: good  CODE STATUS:   Code Status: Full Code   Diet recommendation:  Diet Order             Diet - low sodium heart healthy           Diet Carb Modified           Diet heart healthy/carb modified Room service appropriate? Yes; Fluid consistency: Thin  Diet effective now                    Brief Summary: Patient is a 77 y.o.  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  Per patient-for 2 days prior to admission-he has been having diarrhea-and having up to 6 bowel movements a day.   Significant events: 10/10>> admit to [facility]   Significant studies: 8/20>> A1c 14.0 10/10>> CT head: No acute intracranial abnormality 10/10>> MRI brain: No acute CVA. 10/11>> LDL: 69 10/11>> echo: EF 55-60% 10/12>> carotid Doppler: No significant stenosis   Significant microbiology data: None   Procedures: None   Consults: None  Brief Hospital Course: Presyncope/transient left arm heaviness for a few seconds Suspect this is orthostatic hypotension-in the setting of diarrheal illness-diuretic use Doubt TIA-but getting a workup nonetheless (echo/carotid pending-lipids ordered for tomorrow) Thankfully diarrhea has essentially  resolved. He apparently has mobilized with nursing/physical therapy without any major issues Plan is to discharge home today with home health services.   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  Please ensure patient has follow-up with outpatient nephrology.   Hypokalemia Due to diarrheal illness/diuretic use Will be repleted prior to discharge-no mild Will continue potassium supplementation on discharge-PCP to repeat electrolytes in 1 week.   Persistent atrial flutter Rate controlled Metoprolol/Eliquis Follows with cardiology at [facility] center  Chronic HFpEF Euvolemic Continue Demadex on discharge-adding potassium supplementation Holding metolazone-until seen by outpatient cardiology   DM-2 (A1c 14 on 8/20) with uncontrolled hyperglycemia/poor long-term control CBG stable overnight Continue Semglee 26 units/SSI on discharge (on home regimen is 40 units of Lantus Monitor CBGs and further optimization will be deferred to outpatient setting.  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   Weight as of this encounter: 88.5 kg.   Note-spoke with niece came on the day of discharge.  Discharge Diagnoses:  Principal Problem:   Syncope   Discharge Instructions:  Activity:  As tolerated with Full fall precautions use walker/cane & assistance  PATIENT DETAILS Name: Cory [name] Age: 77 y.o. Sex: male Date of Birth: 13-Jan-1946 MRN: [id number]. Admitting Physician: Lorin Glass, MD WUJ:[id number], Cory [name]., MD  Admit Date: 02/16/2023 Discharge date: 02/19/2023  Recommendations for Outpatient Follow-up:  Follow up with PCP in 1-2 weeks Please obtain CMP/CBC in one week Please ensure follow-up with cardiology/nephrology  Admitted From:  Home  Disposition: Home with home health services   Discharge Condition: good  CODE STATUS:   Code Status: Full Code   Diet recommendation:  Diet Order             Diet - low sodium heart healthy           Diet Carb Modified           Diet heart healthy/carb modified Room service appropriate? Yes; Fluid consistency: Thin  Diet effective now                    Brief Summary: Patient is a 77 y.o.  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  Per patient-for 2 days prior to admission-he has been having diarrhea-and having up to 6 bowel movements a day.   Significant events: 10/10>> admit to [facility]   Significant studies: 8/20>> A1c 14.0 10/10>> CT head: No acute intracranial abnormality 10/10>> MRI brain: No acute CVA. 10/11>> LDL: 69 10/11>> echo: EF 55-60% 10/12>> carotid Doppler: No significant stenosis   Significant microbiology data: None   Procedures: None   Consults: None  Brief Hospital Course: Presyncope/transient left arm heaviness for a few seconds Suspect this is orthostatic hypotension-in the setting of diarrheal illness-diuretic use Doubt TIA-but getting a workup nonetheless (echo/carotid pending-lipids ordered for tomorrow) Thankfully diarrhea has essentially  resolved. He apparently has mobilized with nursing/physical therapy without any major issues Plan is to discharge home today with home health services.   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  Please ensure patient has follow-up with outpatient nephrology.   Hypokalemia Due to diarrheal illness/diuretic use Will be repleted prior to discharge-no mild Will continue potassium supplementation on discharge-PCP to repeat electrolytes in 1 week.   Persistent atrial flutter Rate controlled Metoprolol/Eliquis Follows with cardiology at [facility] center  Chronic HFpEF Euvolemic Continue Demadex on discharge-adding potassium supplementation Holding metolazone-until seen by outpatient cardiology   DM-2 (A1c 14 on 8/20) with uncontrolled hyperglycemia/poor long-term control CBG stable overnight Continue Semglee 26 units/SSI on discharge (on home regimen is 40 units of Lantus Monitor CBGs and further optimization will be deferred to outpatient setting.  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   Weight as of this encounter: 88.5 kg.   Note-spoke with niece came on the day of discharge.  Discharge Diagnoses:  Principal Problem:   Syncope   Discharge Instructions:  Activity:  As tolerated with Full fall precautions use walker/cane & assistance  PATIENT DETAILS Name: Cory [name] Age: 77 y.o. Sex: male Date of Birth: 13-Jan-1946 MRN: [id number]. Admitting Physician: Lorin Glass, MD WUJ:[id number], Cory [name]., MD  Admit Date: 02/16/2023 Discharge date: 02/19/2023  Recommendations for Outpatient Follow-up:  Follow up with PCP in 1-2 weeks Please obtain CMP/CBC in one week Please ensure follow-up with cardiology/nephrology  Admitted From:  Home  Disposition: Home with home health services   Discharge Condition: good  CODE STATUS:   Code Status: Full Code   Diet recommendation:  Diet Order             Diet - low sodium heart healthy           Diet Carb Modified           Diet heart healthy/carb modified Room service appropriate? Yes; Fluid consistency: Thin  Diet effective now                    Brief Summary: Patient is a 77 y.o.  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  Per patient-for 2 days prior to admission-he has been having diarrhea-and having up to 6 bowel movements a day.   Significant events: 10/10>> admit to [facility]   Significant studies: 8/20>> A1c 14.0 10/10>> CT head: No acute intracranial abnormality 10/10>> MRI brain: No acute CVA. 10/11>> LDL: 69 10/11>> echo: EF 55-60% 10/12>> carotid Doppler: No significant stenosis   Significant microbiology data: None   Procedures: None   Consults: None  Brief Hospital Course: Presyncope/transient left arm heaviness for a few seconds Suspect this is orthostatic hypotension-in the setting of diarrheal illness-diuretic use Doubt TIA-but getting a workup nonetheless (echo/carotid pending-lipids ordered for tomorrow) Thankfully diarrhea has essentially  resolved. He apparently has mobilized with nursing/physical therapy without any major issues Plan is to discharge home today with home health services.   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  Please ensure patient has follow-up with outpatient nephrology.   Hypokalemia Due to diarrheal illness/diuretic use Will be repleted prior to discharge-no mild Will continue potassium supplementation on discharge-PCP to repeat electrolytes in 1 week.   Persistent atrial flutter Rate controlled Metoprolol/Eliquis Follows with cardiology at [facility]  Chronic HFpEF Euvolemic Continue Demadex on discharge-adding potassium supplementation Holding metolazone-until seen by outpatient cardiology   DM-2 (A1c 14 on 8/20) with uncontrolled hyperglycemia/poor long-term control CBG stable overnight Continue Semglee 26 units/SSI on discharge (on home regimen is 40 units of Lantus Monitor CBGs and further optimization will be deferred to outpatient setting.  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   Weight as of this encounter: 88.5 kg.   Note-spoke with niece came on the day of discharge.  Discharge Diagnoses:  Principal Problem:   Syncope   Discharge Instructions:  Activity:  As tolerated with Full fall precautions use walker/cane & assistance  PATIENT DETAILS Name: Cory [name] Age: 77 y.o. Sex: male Date of Birth: 13-Jan-1946 MRN: [id number]. Admitting Physician: Lorin Glass, MD WUJ:[id number], Cory [name]., MD  Admit Date: 02/16/2023 Discharge date: 02/19/2023  Recommendations for Outpatient Follow-up:  Follow up with PCP in 1-2 weeks Please obtain CMP/CBC in one week Please ensure follow-up with cardiology/nephrology  Admitted From:  Home  Disposition: Home with home health services   Discharge Condition: good  CODE STATUS:   Code Status: Full Code   Diet recommendation:  Diet Order             Diet - low sodium heart healthy           Diet Carb Modified           Diet heart healthy/carb modified Room service appropriate? Yes; Fluid consistency: Thin  Diet effective now                    Brief Summary: Patient is a 77 y.o.  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  Per patient-for 2 days prior to admission-he has been having diarrhea-and having up to 6 bowel movements a day.   Significant events: 10/10>> admit to [facility]   Significant studies: 8/20>> A1c 14.0 10/10>> CT head: No acute intracranial abnormality 10/10>> MRI brain: No acute CVA. 10/11>> LDL: 69 10/11>> echo: EF 55-60% 10/12>> carotid Doppler: No significant stenosis   Significant microbiology data: None   Procedures: None   Consults: None  Brief Hospital Course: Presyncope/transient left arm heaviness for a few seconds Suspect this is orthostatic hypotension-in the setting of diarrheal illness-diuretic use Doubt TIA-but getting a workup nonetheless (echo/carotid pending-lipids ordered for tomorrow) Thankfully diarrhea has essentially  resolved. He apparently has mobilized with nursing/physical therapy without any major issues Plan is to discharge home today with home health services.   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  Please ensure patient has follow-up with outpatient nephrology.   Hypokalemia Due to diarrheal illness/diuretic use Will be repleted prior to discharge-no mild Will continue potassium supplementation on discharge-PCP to repeat electrolytes in 1 week.   Persistent atrial flutter Rate controlled Metoprolol/Eliquis Follows with cardiology at [facility] center  Chronic HFpEF Euvolemic Continue Demadex on discharge-adding potassium supplementation Holding metolazone-until seen by outpatient cardiology   DM-2 (A1c 14 on 8/20) with uncontrolled hyperglycemia/poor long-term control CBG stable overnight Continue Semglee 26 units/SSI on discharge (on home regimen is 40 units of Lantus Monitor CBGs and further optimization will be deferred to outpatient setting.  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   Weight as of this encounter: 88.5 kg.   Note-spoke with niece came on the day of discharge.  Discharge Diagnoses:  Principal Problem:   Syncope   Discharge Instructions:  Activity:  As tolerated with Full fall precautions use walker/cane & assistance  US CAROTID  Result Date: 02/19/2023 Carotid Arterial Duplex Study Patient Name:  Cory [name]  Date of Exam:   02/18/2023 Medical Rec #: [id number]     Accession #:    [id number] Date of Birth: [birth year]     Patient Gender: M Patient Age:   77 years Exam Location:  [facility] Procedure:      VAS US CAROTID Referring Phys: Lorin Glass --------------------------------------------------------------------------------  Indications:       Syncope. Risk Factors:      Hypertension, hyperlipidemia, Diabetes, no history of                    smoking. Comparison Study:  No previous exams Performing Technologist: Jody Hill RVT, RDMS  Examination Guidelines: A complete evaluation includes B-mode imaging, spectral Doppler, color Doppler, and power Doppler as needed of all accessible portions of each vessel. Bilateral testing is considered an integral part of a complete examination. Limited examinations for reoccurring indications may be performed as noted.  Right Carotid Findings: +----------+--------+--------+--------+------------------+--------+           PSV cm/sEDV cm/sStenosisPlaque DescriptionComments +----------+--------+--------+--------+------------------+--------+ CCA Prox  76       11                                         +----------+--------+--------+--------+------------------+--------+ CCA Distal53      10                                         +----------+--------+--------+--------+------------------+--------+ ICA Prox  35      10                                         +----------+--------+--------+--------+------------------+--------+ ICA Distal39      11                                         +----------+--------+--------+--------+------------------+--------+ ECA       50      4                                          +----------+--------+--------+--------+------------------+--------+ +----------+--------+-------+----------------+-------------------+           PSV cm/sEDV cmsDescribe        Arm Pressure (mmHG) +----------+--------+-------+----------------+-------------------+ [id number]             Multiphasic, WNL                    +----------+--------+-------+----------------+-------------------+ +---------+--------+--+--------+-+---------+ VertebralPSV cm/s18EDV cm/s6Antegrade +---------+--------+--+--------+-+---------+  Left Carotid Findings: +----------+--------+--------+--------+------------------+------------------+           PSV cm/sEDV cm/sStenosisPlaque DescriptionComments           +----------+--------+--------+--------+------------------+------------------+ CCA Prox  93      14                                                   +----------+--------+--------+--------+------------------+------------------+

## 2023-04-19 DIAGNOSIS — C185 Malignant neoplasm of splenic flexure: Secondary | ICD-10-CM | POA: Insufficient documentation

## 2023-04-19 DIAGNOSIS — C186 Malignant neoplasm of descending colon: Secondary | ICD-10-CM | POA: Insufficient documentation

## 2023-04-26 DIAGNOSIS — N3040 Irradiation cystitis without hematuria: Secondary | ICD-10-CM | POA: Insufficient documentation

## 2023-04-26 DIAGNOSIS — Z8546 Personal history of malignant neoplasm of prostate: Secondary | ICD-10-CM | POA: Insufficient documentation

## 2023-04-26 DIAGNOSIS — N289 Disorder of kidney and ureter, unspecified: Secondary | ICD-10-CM | POA: Insufficient documentation

## 2023-04-26 DIAGNOSIS — Z89511 Acquired absence of right leg below knee: Secondary | ICD-10-CM | POA: Insufficient documentation

## 2023-05-08 DIAGNOSIS — Z7901 Long term (current) use of anticoagulants: Secondary | ICD-10-CM | POA: Insufficient documentation

## 2023-05-09 ENCOUNTER — Inpatient Hospital Stay (HOSPITAL_COMMUNITY): Payer: Medicare HMO

## 2023-05-09 ENCOUNTER — Other Ambulatory Visit: Payer: Self-pay

## 2023-05-09 ENCOUNTER — Emergency Department (HOSPITAL_COMMUNITY): Payer: Medicare HMO

## 2023-05-09 ENCOUNTER — Encounter (HOSPITAL_COMMUNITY): Payer: Self-pay

## 2023-05-09 ENCOUNTER — Inpatient Hospital Stay (HOSPITAL_COMMUNITY)
Admission: EM | Admit: 2023-05-09 | Discharge: 2023-05-22 | DRG: 871 | Disposition: A | Discharge status: Skilled Nursing Facility | Payer: Medicare HMO | Attending: Internal Medicine | Admitting: Internal Medicine

## 2023-05-09 DIAGNOSIS — Z794 Long term (current) use of insulin: Secondary | ICD-10-CM

## 2023-05-09 DIAGNOSIS — R4182 Altered mental status, unspecified: Secondary | ICD-10-CM | POA: Diagnosis present

## 2023-05-09 DIAGNOSIS — Z923 Personal history of irradiation: Secondary | ICD-10-CM

## 2023-05-09 DIAGNOSIS — E669 Obesity, unspecified: Secondary | ICD-10-CM | POA: Diagnosis present

## 2023-05-09 DIAGNOSIS — I4892 Unspecified atrial flutter: Secondary | ICD-10-CM | POA: Diagnosis present

## 2023-05-09 DIAGNOSIS — E1122 Type 2 diabetes mellitus with diabetic chronic kidney disease: Secondary | ICD-10-CM | POA: Diagnosis present

## 2023-05-09 DIAGNOSIS — N400 Enlarged prostate without lower urinary tract symptoms: Secondary | ICD-10-CM | POA: Diagnosis present

## 2023-05-09 DIAGNOSIS — R451 Restlessness and agitation: Secondary | ICD-10-CM | POA: Diagnosis not present

## 2023-05-09 DIAGNOSIS — R6521 Severe sepsis with septic shock: Secondary | ICD-10-CM | POA: Diagnosis present

## 2023-05-09 DIAGNOSIS — Z1612 Extended spectrum beta lactamase (ESBL) resistance: Secondary | ICD-10-CM | POA: Diagnosis present

## 2023-05-09 DIAGNOSIS — G9340 Encephalopathy, unspecified: Secondary | ICD-10-CM | POA: Diagnosis not present

## 2023-05-09 DIAGNOSIS — K625 Hemorrhage of anus and rectum: Secondary | ICD-10-CM | POA: Diagnosis not present

## 2023-05-09 DIAGNOSIS — R404 Transient alteration of awareness: Secondary | ICD-10-CM | POA: Diagnosis not present

## 2023-05-09 DIAGNOSIS — E1110 Type 2 diabetes mellitus with ketoacidosis without coma: Secondary | ICD-10-CM | POA: Diagnosis present

## 2023-05-09 DIAGNOSIS — I130 Hypertensive heart and chronic kidney disease with heart failure and stage 1 through stage 4 chronic kidney disease, or unspecified chronic kidney disease: Secondary | ICD-10-CM | POA: Diagnosis present

## 2023-05-09 DIAGNOSIS — Z89511 Acquired absence of right leg below knee: Secondary | ICD-10-CM

## 2023-05-09 DIAGNOSIS — R6520 Severe sepsis without septic shock: Secondary | ICD-10-CM | POA: Diagnosis not present

## 2023-05-09 DIAGNOSIS — D500 Iron deficiency anemia secondary to blood loss (chronic): Secondary | ICD-10-CM | POA: Diagnosis present

## 2023-05-09 DIAGNOSIS — E440 Moderate protein-calorie malnutrition: Secondary | ICD-10-CM | POA: Diagnosis present

## 2023-05-09 DIAGNOSIS — L89152 Pressure ulcer of sacral region, stage 2: Secondary | ICD-10-CM | POA: Diagnosis present

## 2023-05-09 DIAGNOSIS — G4089 Other seizures: Secondary | ICD-10-CM | POA: Diagnosis present

## 2023-05-09 DIAGNOSIS — N390 Urinary tract infection, site not specified: Secondary | ICD-10-CM | POA: Diagnosis present

## 2023-05-09 DIAGNOSIS — J9601 Acute respiratory failure with hypoxia: Secondary | ICD-10-CM | POA: Diagnosis present

## 2023-05-09 DIAGNOSIS — A4189 Other specified sepsis: Principal | ICD-10-CM | POA: Diagnosis present

## 2023-05-09 DIAGNOSIS — D631 Anemia in chronic kidney disease: Secondary | ICD-10-CM | POA: Diagnosis present

## 2023-05-09 DIAGNOSIS — I5032 Chronic diastolic (congestive) heart failure: Secondary | ICD-10-CM | POA: Diagnosis present

## 2023-05-09 DIAGNOSIS — E785 Hyperlipidemia, unspecified: Secondary | ICD-10-CM | POA: Diagnosis present

## 2023-05-09 DIAGNOSIS — M6282 Rhabdomyolysis: Secondary | ICD-10-CM | POA: Diagnosis present

## 2023-05-09 DIAGNOSIS — E870 Hyperosmolality and hypernatremia: Secondary | ICD-10-CM | POA: Diagnosis not present

## 2023-05-09 DIAGNOSIS — N179 Acute kidney failure, unspecified: Secondary | ICD-10-CM | POA: Diagnosis present

## 2023-05-09 DIAGNOSIS — G9341 Metabolic encephalopathy: Principal | ICD-10-CM

## 2023-05-09 DIAGNOSIS — D509 Iron deficiency anemia, unspecified: Secondary | ICD-10-CM | POA: Diagnosis not present

## 2023-05-09 DIAGNOSIS — Z8616 Personal history of COVID-19: Secondary | ICD-10-CM | POA: Diagnosis not present

## 2023-05-09 DIAGNOSIS — C186 Malignant neoplasm of descending colon: Secondary | ICD-10-CM | POA: Diagnosis not present

## 2023-05-09 DIAGNOSIS — K921 Melena: Secondary | ICD-10-CM | POA: Diagnosis present

## 2023-05-09 DIAGNOSIS — W06XXXA Fall from bed, initial encounter: Secondary | ICD-10-CM | POA: Diagnosis present

## 2023-05-09 DIAGNOSIS — H518 Other specified disorders of binocular movement: Secondary | ICD-10-CM | POA: Diagnosis present

## 2023-05-09 DIAGNOSIS — R569 Unspecified convulsions: Secondary | ICD-10-CM | POA: Diagnosis not present

## 2023-05-09 DIAGNOSIS — R579 Shock, unspecified: Secondary | ICD-10-CM | POA: Diagnosis not present

## 2023-05-09 DIAGNOSIS — R7401 Elevation of levels of liver transaminase levels: Secondary | ICD-10-CM | POA: Diagnosis present

## 2023-05-09 DIAGNOSIS — C187 Malignant neoplasm of sigmoid colon: Secondary | ICD-10-CM | POA: Diagnosis present

## 2023-05-09 DIAGNOSIS — E860 Dehydration: Secondary | ICD-10-CM | POA: Diagnosis present

## 2023-05-09 DIAGNOSIS — K6389 Other specified diseases of intestine: Secondary | ICD-10-CM | POA: Diagnosis not present

## 2023-05-09 DIAGNOSIS — S81812A Laceration without foreign body, left lower leg, initial encounter: Secondary | ICD-10-CM | POA: Diagnosis present

## 2023-05-09 DIAGNOSIS — U071 COVID-19: Secondary | ICD-10-CM | POA: Diagnosis present

## 2023-05-09 DIAGNOSIS — A4151 Sepsis due to Escherichia coli [E. coli]: Secondary | ICD-10-CM | POA: Diagnosis present

## 2023-05-09 DIAGNOSIS — E876 Hypokalemia: Secondary | ICD-10-CM | POA: Diagnosis present

## 2023-05-09 DIAGNOSIS — Z6826 Body mass index (BMI) 26.0-26.9, adult: Secondary | ICD-10-CM

## 2023-05-09 DIAGNOSIS — R339 Retention of urine, unspecified: Secondary | ICD-10-CM | POA: Diagnosis present

## 2023-05-09 DIAGNOSIS — Z79899 Other long term (current) drug therapy: Secondary | ICD-10-CM

## 2023-05-09 DIAGNOSIS — Z9049 Acquired absence of other specified parts of digestive tract: Secondary | ICD-10-CM

## 2023-05-09 DIAGNOSIS — Z7901 Long term (current) use of anticoagulants: Secondary | ICD-10-CM

## 2023-05-09 DIAGNOSIS — Y92013 Bedroom of single-family (private) house as the place of occurrence of the external cause: Secondary | ICD-10-CM | POA: Diagnosis not present

## 2023-05-09 DIAGNOSIS — B9620 Unspecified Escherichia coli [E. coli] as the cause of diseases classified elsewhere: Secondary | ICD-10-CM | POA: Diagnosis present

## 2023-05-09 DIAGNOSIS — A419 Sepsis, unspecified organism: Principal | ICD-10-CM | POA: Diagnosis present

## 2023-05-09 DIAGNOSIS — Z833 Family history of diabetes mellitus: Secondary | ICD-10-CM

## 2023-05-09 DIAGNOSIS — Z8673 Personal history of transient ischemic attack (TIA), and cerebral infarction without residual deficits: Secondary | ICD-10-CM

## 2023-05-09 DIAGNOSIS — R001 Bradycardia, unspecified: Secondary | ICD-10-CM | POA: Diagnosis not present

## 2023-05-09 DIAGNOSIS — E11649 Type 2 diabetes mellitus with hypoglycemia without coma: Secondary | ICD-10-CM | POA: Diagnosis not present

## 2023-05-09 DIAGNOSIS — R3129 Other microscopic hematuria: Secondary | ICD-10-CM | POA: Diagnosis present

## 2023-05-09 DIAGNOSIS — I4891 Unspecified atrial fibrillation: Secondary | ICD-10-CM | POA: Diagnosis present

## 2023-05-09 DIAGNOSIS — S70312A Abrasion, left thigh, initial encounter: Secondary | ICD-10-CM | POA: Diagnosis present

## 2023-05-09 DIAGNOSIS — N1832 Chronic kidney disease, stage 3b: Secondary | ICD-10-CM | POA: Diagnosis present

## 2023-05-09 DIAGNOSIS — Z8546 Personal history of malignant neoplasm of prostate: Secondary | ICD-10-CM

## 2023-05-09 DIAGNOSIS — Z781 Physical restraint status: Secondary | ICD-10-CM

## 2023-05-09 DIAGNOSIS — Z6828 Body mass index (BMI) 28.0-28.9, adult: Secondary | ICD-10-CM

## 2023-05-09 DIAGNOSIS — S51012A Laceration without foreign body of left elbow, initial encounter: Secondary | ICD-10-CM | POA: Diagnosis present

## 2023-05-09 LAB — CBC WITH DIFFERENTIAL/PLATELET
Abs Immature Granulocytes: 0.03 10*3/uL (ref 0.00–0.07)
Basophils Absolute: 0 10*3/uL (ref 0.0–0.1)
Basophils Relative: 0 %
Eosinophils Absolute: 0 10*3/uL (ref 0.0–0.5)
Eosinophils Relative: 0 %
HCT: 35.2 % — ABNORMAL LOW (ref 39.0–52.0)
Hemoglobin: 11.7 g/dL — ABNORMAL LOW (ref 13.0–17.0)
Immature Granulocytes: 0 %
Lymphocytes Relative: 8 %
Lymphs Abs: 0.8 10*3/uL (ref 0.7–4.0)
MCH: 24.3 pg — ABNORMAL LOW (ref 26.0–34.0)
MCHC: 33.2 g/dL (ref 30.0–36.0)
MCV: 73 fL — ABNORMAL LOW (ref 80.0–100.0)
Monocytes Absolute: 0.9 10*3/uL (ref 0.1–1.0)
Monocytes Relative: 8 %
Neutro Abs: 8.5 10*3/uL — ABNORMAL HIGH (ref 1.7–7.7)
Neutrophils Relative %: 84 %
Platelets: 177 10*3/uL (ref 150–400)
RBC: 4.82 MIL/uL (ref 4.22–5.81)
RDW: 14.8 % (ref 11.5–15.5)
WBC: 10.3 10*3/uL (ref 4.0–10.5)
nRBC: 0 % (ref 0.0–0.2)

## 2023-05-09 LAB — PROCALCITONIN: Procalcitonin: 4.51 ng/mL

## 2023-05-09 LAB — I-STAT ARTERIAL BLOOD GAS, ED
Acid-Base Excess: 6 mmol/L — ABNORMAL HIGH (ref 0.0–2.0)
Bicarbonate: 28 mmol/L (ref 20.0–28.0)
Calcium, Ion: 1.09 mmol/L — ABNORMAL LOW (ref 1.15–1.40)
HCT: 32 % — ABNORMAL LOW (ref 39.0–52.0)
Hemoglobin: 10.9 g/dL — ABNORMAL LOW (ref 13.0–17.0)
O2 Saturation: 100 %
Patient temperature: 102.4
Potassium: 3.5 mmol/L (ref 3.5–5.1)
Sodium: 135 mmol/L (ref 135–145)
TCO2: 29 mmol/L (ref 22–32)
pCO2 arterial: 34.8 mm[Hg] (ref 32–48)
pH, Arterial: 7.52 — ABNORMAL HIGH (ref 7.35–7.45)
pO2, Arterial: 434 mm[Hg] — ABNORMAL HIGH (ref 83–108)

## 2023-05-09 LAB — BASIC METABOLIC PANEL
Anion gap: 16 — ABNORMAL HIGH (ref 5–15)
Anion gap: 12 (ref 5–15)
BUN: 53 mg/dL — ABNORMAL HIGH (ref 8–23)
BUN: 60 mg/dL — ABNORMAL HIGH (ref 8–23)
CO2: 23 mmol/L (ref 22–32)
CO2: 25 mmol/L (ref 22–32)
Calcium: 8.1 mg/dL — ABNORMAL LOW (ref 8.9–10.3)
Calcium: 8.5 mg/dL — ABNORMAL LOW (ref 8.9–10.3)
Chloride: 103 mmol/L (ref 98–111)
Chloride: 102 mmol/L (ref 98–111)
Creatinine, Ser: 3.52 mg/dL — ABNORMAL HIGH (ref 0.61–1.24)
Creatinine, Ser: 4.19 mg/dL — ABNORMAL HIGH (ref 0.61–1.24)
GFR, Estimated: 17 mL/min — ABNORMAL LOW (ref >60)
GFR, Estimated: 14 mL/min — ABNORMAL LOW (ref >60)
Glucose, Bld: 356 mg/dL — ABNORMAL HIGH (ref 70–99)
Glucose, Bld: 211 mg/dL — ABNORMAL HIGH (ref 70–99)
Potassium: 2.8 mmol/L — ABNORMAL LOW (ref 3.5–5.1)
Potassium: 3.3 mmol/L — ABNORMAL LOW (ref 3.5–5.1)
Sodium: 142 mmol/L (ref 135–145)
Sodium: 139 mmol/L (ref 135–145)

## 2023-05-09 LAB — I-STAT CHEM 8, ED
BUN: 55 mg/dL — ABNORMAL HIGH (ref 8–23)
Calcium, Ion: 1.08 mmol/L — ABNORMAL LOW (ref 1.15–1.40)
Chloride: 93 mmol/L — ABNORMAL LOW (ref 98–111)
Creatinine, Ser: 3.7 mg/dL — ABNORMAL HIGH (ref 0.61–1.24)
Glucose, Bld: >700 mg/dL (ref 70–99)
HCT: 37 % — ABNORMAL LOW (ref 39.0–52.0)
Hemoglobin: 12.6 g/dL — ABNORMAL LOW (ref 13.0–17.0)
Potassium: 3.6 mmol/L (ref 3.5–5.1)
Sodium: 135 mmol/L (ref 135–145)
TCO2: 27 mmol/L (ref 22–32)

## 2023-05-09 LAB — I-STAT CG4 LACTIC ACID, ED
Lactic Acid, Venous: 5.3 mmol/L (ref 0.5–1.9)
Lactic Acid, Venous: 3.5 mmol/L (ref 0.5–1.9)

## 2023-05-09 LAB — CBG MONITORING, ED
Glucose-Capillary: >600 mg/dL (ref 70–99)
Glucose-Capillary: >600 mg/dL (ref 70–99)
Glucose-Capillary: 579 mg/dL (ref 70–99)
Glucose-Capillary: >600 mg/dL (ref 70–99)

## 2023-05-09 LAB — GLUCOSE, CAPILLARY
Glucose-Capillary: 593 mg/dL (ref 70–99)
Glucose-Capillary: >600 mg/dL (ref 70–99)
Glucose-Capillary: 456 mg/dL — ABNORMAL HIGH (ref 70–99)
Glucose-Capillary: 457 mg/dL — ABNORMAL HIGH (ref 70–99)
Glucose-Capillary: 394 mg/dL — ABNORMAL HIGH (ref 70–99)
Glucose-Capillary: 319 mg/dL — ABNORMAL HIGH (ref 70–99)
Glucose-Capillary: 180 mg/dL — ABNORMAL HIGH (ref 70–99)
Glucose-Capillary: 189 mg/dL — ABNORMAL HIGH (ref 70–99)
Glucose-Capillary: 192 mg/dL — ABNORMAL HIGH (ref 70–99)
Glucose-Capillary: 213 mg/dL — ABNORMAL HIGH (ref 70–99)

## 2023-05-09 LAB — URINALYSIS, W/ REFLEX TO CULTURE (INFECTION SUSPECTED)
Bilirubin Urine: NEGATIVE
Glucose, UA: >=500 mg/dL — AB
Ketones, ur: 5 mg/dL — AB
Nitrite: NEGATIVE
Protein, ur: >=300 mg/dL — AB
Specific Gravity, Urine: 1.02 (ref 1.005–1.030)
pH: 5 (ref 5.0–8.0)

## 2023-05-09 LAB — COMPREHENSIVE METABOLIC PANEL
ALT: 46 U/L — ABNORMAL HIGH (ref 0–44)
AST: 137 U/L — ABNORMAL HIGH (ref 15–41)
Albumin: 3.4 g/dL — ABNORMAL LOW (ref 3.5–5.0)
Alkaline Phosphatase: 71 U/L (ref 38–126)
Anion gap: 19 — ABNORMAL HIGH (ref 5–15)
BUN: 59 mg/dL — ABNORMAL HIGH (ref 8–23)
CO2: 25 mmol/L (ref 22–32)
Calcium: 9.2 mg/dL (ref 8.9–10.3)
Chloride: 89 mmol/L — ABNORMAL LOW (ref 98–111)
Creatinine, Ser: 3.84 mg/dL — ABNORMAL HIGH (ref 0.61–1.24)
GFR, Estimated: 16 mL/min — ABNORMAL LOW (ref >60)
Glucose, Bld: 764 mg/dL (ref 70–99)
Potassium: 3.5 mmol/L (ref 3.5–5.1)
Sodium: 133 mmol/L — ABNORMAL LOW (ref 135–145)
Total Bilirubin: 0.8 mg/dL (ref 0.0–1.2)
Total Protein: 7.9 g/dL (ref 6.5–8.1)

## 2023-05-09 LAB — MRSA NEXT GEN BY PCR, NASAL: MRSA by PCR Next Gen: NOT DETECTED

## 2023-05-09 LAB — RESP PANEL BY RT-PCR (RSV, FLU A&B, COVID)  RVPGX2
Influenza A by PCR: NEGATIVE
Influenza B by PCR: NEGATIVE
Resp Syncytial Virus by PCR: NEGATIVE
SARS Coronavirus 2 by RT PCR: POSITIVE — AB

## 2023-05-09 LAB — PROTIME-INR
INR: 1.2 (ref 0.8–1.2)
Prothrombin Time: 15.7 s — ABNORMAL HIGH (ref 11.4–15.2)

## 2023-05-09 LAB — MAGNESIUM: Magnesium: 2.4 mg/dL (ref 1.7–2.4)

## 2023-05-09 LAB — BETA-HYDROXYBUTYRIC ACID
Beta-Hydroxybutyric Acid: 1.98 mmol/L — ABNORMAL HIGH (ref 0.05–0.27)
Beta-Hydroxybutyric Acid: 0.94 mmol/L — ABNORMAL HIGH (ref 0.05–0.27)
Beta-Hydroxybutyric Acid: 0.41 mmol/L — ABNORMAL HIGH (ref 0.05–0.27)

## 2023-05-09 LAB — AMMONIA: Ammonia: 22 umol/L (ref 9–35)

## 2023-05-09 LAB — CK: Total CK: 11470 U/L — ABNORMAL HIGH (ref 49–397)

## 2023-05-09 MED ORDER — NOREPINEPHRINE 4 MG/250ML-% IV SOLN
INTRAVENOUS | Status: AC
  Administered 2023-05-09: 2 ug/min via INTRAVENOUS
  Filled 2023-05-09: qty 250

## 2023-05-09 MED ORDER — POTASSIUM CHLORIDE 10 MEQ/50ML IV SOLN
INTRAVENOUS | Status: AC
  Filled 2023-05-09: qty 50

## 2023-05-09 MED ORDER — FENTANYL CITRATE PF 50 MCG/ML IJ SOSY
25.0000 ug | PREFILLED_SYRINGE | Freq: Once | INTRAMUSCULAR | Status: DC
Start: 2023-05-09 — End: 2023-05-09

## 2023-05-09 MED ORDER — DEXTROSE 50 % IV SOLN: 0.0000 mL | INTRAVENOUS | Status: DC | PRN

## 2023-05-09 MED ORDER — ORAL CARE MOUTH RINSE
15.0000 mL | OROMUCOSAL | Status: DC
  Administered 2023-05-09 – 2023-05-13 (×47): 15 mL via OROMUCOSAL

## 2023-05-09 MED ORDER — DOCUSATE SODIUM 100 MG PO CAPS: 100.0000 mg | ORAL_CAPSULE | Freq: Two times a day (BID) | ORAL | Status: DC | PRN

## 2023-05-09 MED ORDER — METOPROLOL TARTRATE 5 MG/5ML IV SOLN
5.0000 mg | INTRAVENOUS | Status: DC | PRN
  Administered 2023-05-09 – 2023-05-10 (×2): 5 mg via INTRAVENOUS
  Filled 2023-05-09 (×2): qty 5

## 2023-05-09 MED ORDER — PIPERACILLIN-TAZOBACTAM 3.375 G IVPB 30 MIN
3.3750 g | Freq: Once | INTRAVENOUS | Status: AC
Start: 2023-05-09 — End: 2023-05-09
  Administered 2023-05-09: 3.375 g via INTRAVENOUS

## 2023-05-09 MED ORDER — FENTANYL CITRATE PF 50 MCG/ML IJ SOSY
25.0000 ug | PREFILLED_SYRINGE | INTRAMUSCULAR | Status: DC | PRN
  Administered 2023-05-10: 25 ug via INTRAVENOUS
  Administered 2023-05-11: 50 ug via INTRAVENOUS
  Administered 2023-05-11: 25 ug via INTRAVENOUS
  Administered 2023-05-11 – 2023-05-13 (×9): 50 ug via INTRAVENOUS
  Administered 2023-05-13: 100 ug via INTRAVENOUS
  Administered 2023-05-13 (×2): 50 ug via INTRAVENOUS
  Filled 2023-05-09: qty 1
  Filled 2023-05-09: qty 2
  Filled 2023-05-09: qty 1
  Filled 2023-05-09: qty 2
  Filled 2023-05-09 (×3): qty 1
  Filled 2023-05-09: qty 2
  Filled 2023-05-09 (×6): qty 1

## 2023-05-09 MED ORDER — ROCURONIUM BROMIDE 10 MG/ML (PF) SYRINGE
PREFILLED_SYRINGE | INTRAVENOUS | Status: AC | PRN
  Administered 2023-05-09: 100 mg via INTRAVENOUS

## 2023-05-09 MED ORDER — LEVETIRACETAM IN NACL 1500 MG/100ML IV SOLN
1500.0000 mg | Freq: Once | INTRAVENOUS | Status: AC
  Administered 2023-05-09: 1500 mg via INTRAVENOUS
  Filled 2023-05-09: qty 100

## 2023-05-09 MED ORDER — FENTANYL 2500MCG IN NS 250ML (10MCG/ML) PREMIX INFUSION
25.0000 ug/h | INTRAVENOUS | Status: DC
  Administered 2023-05-09: 25 ug/h via INTRAVENOUS
  Filled 2023-05-09: qty 250

## 2023-05-09 MED ORDER — NOREPINEPHRINE 4 MG/250ML-% IV SOLN
0.0000 ug/min | INTRAVENOUS | Status: DC
Start: 2023-05-09 — End: 2023-05-11
  Administered 2023-05-10: 6 ug/min via INTRAVENOUS
  Filled 2023-05-09: qty 250

## 2023-05-09 MED ORDER — FAMOTIDINE 20 MG PO TABS
20.0000 mg | ORAL_TABLET | Freq: Every day | ORAL | Status: DC
  Administered 2023-05-09 – 2023-05-13 (×5): 20 mg
  Filled 2023-05-09 (×5): qty 1

## 2023-05-09 MED ORDER — FENTANYL CITRATE PF 50 MCG/ML IJ SOSY
25.0000 ug | PREFILLED_SYRINGE | INTRAMUSCULAR | Status: DC | PRN
  Filled 2023-05-09: qty 1

## 2023-05-09 MED ORDER — MIDAZOLAM HCL 5 MG/5ML IJ SOLN
INTRAMUSCULAR | Status: AC | PRN
  Administered 2023-05-09: 4 mg via INTRAVENOUS

## 2023-05-09 MED ORDER — VANCOMYCIN HCL 1750 MG/350ML IV SOLN
1750.0000 mg | Freq: Once | INTRAVENOUS | Status: AC
  Administered 2023-05-09: 1750 mg via INTRAVENOUS
  Filled 2023-05-09: qty 350

## 2023-05-09 MED ORDER — ETOMIDATE 2 MG/ML IV SOLN
INTRAVENOUS | Status: AC | PRN
  Administered 2023-05-09: 20 mg via INTRAVENOUS

## 2023-05-09 MED ORDER — HEPARIN SODIUM (PORCINE) 5000 UNIT/ML IJ SOLN
5000.0000 [IU] | Freq: Three times a day (TID) | INTRAMUSCULAR | Status: DC
  Administered 2023-05-09 – 2023-05-11 (×7): 5000 [IU] via SUBCUTANEOUS
  Filled 2023-05-09 (×7): qty 1

## 2023-05-09 MED ORDER — MIDAZOLAM HCL 2 MG/2ML IJ SOLN
INTRAMUSCULAR | Status: AC
  Filled 2023-05-09: qty 4

## 2023-05-09 MED ORDER — ACETAMINOPHEN 10 MG/ML IV SOLN
1000.0000 mg | Freq: Four times a day (QID) | INTRAVENOUS | Status: DC
  Administered 2023-05-09 – 2023-05-10 (×3): 1000 mg via INTRAVENOUS
  Filled 2023-05-09 (×4): qty 100

## 2023-05-09 MED ORDER — DEXTROSE 5 % IV SOLN
800.0000 mg | INTRAVENOUS | Status: DC
  Administered 2023-05-09: 800 mg via INTRAVENOUS
  Filled 2023-05-09 (×2): qty 16

## 2023-05-09 MED ORDER — SODIUM CHLORIDE 0.9 % IV SOLN
2.0000 g | Freq: Two times a day (BID) | INTRAVENOUS | Status: DC
  Administered 2023-05-09 – 2023-05-10 (×3): 2 g via INTRAVENOUS
  Filled 2023-05-09 (×3): qty 20

## 2023-05-09 MED ORDER — DEXTROSE IN LACTATED RINGERS 5 % IV SOLN: INTRAVENOUS | Status: DC

## 2023-05-09 MED ORDER — ORAL CARE MOUTH RINSE: 15.0000 mL | OROMUCOSAL | Status: DC | PRN

## 2023-05-09 MED ORDER — LACTATED RINGERS IV BOLUS
500.0000 mL | Freq: Once | INTRAVENOUS | Status: AC
  Administered 2023-05-09: 500 mL via INTRAVENOUS

## 2023-05-09 MED ORDER — POLYETHYLENE GLYCOL 3350 17 G PO PACK: 17.0000 g | PACK | Freq: Every day | ORAL | Status: DC | PRN

## 2023-05-09 MED ORDER — LACTATED RINGERS IV SOLN: INTRAVENOUS | Status: DC

## 2023-05-09 MED ORDER — LACTATED RINGERS IV SOLN
INTRAVENOUS | Status: AC | PRN
  Administered 2023-05-09: 1770 mL via INTRAVENOUS

## 2023-05-09 MED ORDER — ACETAMINOPHEN 650 MG RE SUPP
650.0000 mg | Freq: Once | RECTAL | Status: AC
  Administered 2023-05-09: 650 mg via RECTAL
  Filled 2023-05-09: qty 1

## 2023-05-09 MED ORDER — VANCOMYCIN VARIABLE DOSE PER UNSTABLE RENAL FUNCTION (PHARMACIST DOSING): Status: DC

## 2023-05-09 MED ORDER — FENTANYL BOLUS VIA INFUSION
25.0000 ug | INTRAVENOUS | Status: DC | PRN
  Administered 2023-05-09: 100 ug via INTRAVENOUS
  Administered 2023-05-09: 50 ug via INTRAVENOUS
  Administered 2023-05-10: 25 ug via INTRAVENOUS

## 2023-05-09 MED ORDER — LACTATED RINGERS IV BOLUS: 20.0000 mL/kg | Freq: Once | INTRAVENOUS | Status: DC

## 2023-05-09 MED ORDER — INSULIN REGULAR(HUMAN) IN NACL 100-0.9 UT/100ML-% IV SOLN
INTRAVENOUS | Status: DC
  Administered 2023-05-09: 5 [IU]/h via INTRAVENOUS
  Administered 2023-05-09: 2.4 [IU]/h via INTRAVENOUS
  Filled 2023-05-09 (×2): qty 100

## 2023-05-09 MED ORDER — SODIUM CHLORIDE 0.9 % IV SOLN
2.0000 g | Freq: Three times a day (TID) | INTRAVENOUS | Status: DC
  Administered 2023-05-09 – 2023-05-10 (×3): 2 g via INTRAVENOUS
  Filled 2023-05-09 (×4): qty 2000

## 2023-05-09 MED ORDER — LACTATED RINGERS IV BOLUS: 1000.0000 mL | Freq: Once | INTRAVENOUS | Status: DC

## 2023-05-09 MED ORDER — PROPOFOL 1000 MG/100ML IV EMUL
INTRAVENOUS | Status: AC | PRN
  Administered 2023-05-09: 10 ug/kg/min via INTRAVENOUS

## 2023-05-09 MED ORDER — POTASSIUM CHLORIDE 10 MEQ/50ML IV SOLN
10.0000 meq | INTRAVENOUS | Status: AC
  Administered 2023-05-09 – 2023-05-10 (×6): 10 meq via INTRAVENOUS
  Filled 2023-05-09 (×5): qty 50

## 2023-05-09 MED ORDER — SODIUM CHLORIDE 0.9 % IV SOLN
INTRAVENOUS | Status: AC | PRN
Start: 2023-05-09 — End: 2023-05-10

## 2023-05-09 MED ORDER — PROPOFOL 1000 MG/100ML IV EMUL
INTRAVENOUS | Status: AC
  Filled 2023-05-09: qty 100

## 2023-05-09 NOTE — ED Notes (Signed)
 Patient transport to MRI with RT, RN Ephriam Knuckles NP.

## 2023-05-09 NOTE — Procedures (Signed)
 Patient Name: Cory [name]  MRN: [id number]  Epilepsy Attending: Arlin MALVA Krebs  Referring Physician/Provider: [name], NP  Date: 05/09/2023 Duration: 22.03 mins  Patient history: 77yo M with ams getting eeg to evaluate for seizure  Level of alertness: comatose  AEDs during EEG study: None  Technical aspects: This EEG study was done with scalp electrodes positioned according to the 10-20 International system of electrode placement. Electrical activity was reviewed with band pass filter of 1-70Hz , sensitivity of 7 uV/mm, display speed of 29mm/sec with a 60Hz  notched filter applied as appropriate. EEG data were recorded continuously and digitally stored.  Video monitoring was available and reviewed as appropriate.  Description: EEG showed continuous generalized and maximal bifrontal 3 to 6 Hz theta-delta slowing. Hyperventilation and photic stimulation were not performed.     ABNORMALITY - Continuous slow, generalized and maximal bifrontal   IMPRESSION: This study is suggestive of cortical dysfunction arising from bifrontal region likely secondary to underlying structural abnormality. Additionally there is severe diffuse encephalopathy. No seizures or epileptiform discharges were seen throughout the recording.  Cory [name]

## 2023-05-09 NOTE — Progress Notes (Addendum)
 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  Has known a flutter/fib on Eliquis  at baseline.  Now in A-fib with rates 110s-140s  eICU Interventions  Add metoprolol  as needed pushes for sustained tachycardia.   0243 - K3.5, Cr 4.45; KCl ordered.  9343 -parameters are met on Endo tool recommends transitioning off IV insulin .  Intubated and not eating independently.  Recommend transition once appropriate tube feeds have been initiated.  Intervention Category Intermediate Interventions: Arrhythmia - evaluation and management  Nathan Moctezuma 05/09/2023, 10:48 PM

## 2023-05-09 NOTE — Progress Notes (Signed)
Patient was intubated by ED MD without complications.  Bilateral breath sounds auscultated.  Positive color change noted.  Chest xray obtained for confirmation.  Patient tolerated well.  Will continue to monitor.

## 2023-05-09 NOTE — Progress Notes (Signed)
STAT EEG complete - results pending. ? ?

## 2023-05-09 NOTE — Progress Notes (Signed)
 Pt transported from MRI to 2M04 via vent with no complications noted. Unit RT notified of pt's arrival.

## 2023-05-09 NOTE — Progress Notes (Signed)
 Arterial Catheter Insertion Procedure Note  Cory [name]  [id number]  1946-01-17  Date:05/09/23  Time:8:56 PM    Provider Performing: Vicci Lynwood Nora    Procedure: Insertion of Arterial Line (63379) without US  guidance  Indication(s) Blood pressure monitoring and/or need for frequent ABGs  Consent Unable to obtain consent due to emergent nature of procedure.  Anesthesia None   Time Out Verified patient identification, verified procedure, site/side was marked, verified correct patient position, special equipment/implants available, medications/allergies/relevant history reviewed, required imaging and test results available.   Sterile Technique Maximal sterile technique including full sterile barrier drape, hand hygiene, sterile gown, sterile gloves, mask, hair covering, sterile ultrasound probe cover (if used).   Procedure Description Area of catheter insertion was cleaned with chlorhexidine  and draped in sterile fashion. With real-time ultrasound guidance an arterial catheter was placed into the right radial artery.  Appropriate arterial tracings confirmed on monitor.     Complications/Tolerance None; patient tolerated the procedure well.   EBL Minimal   Specimen(s) None

## 2023-05-09 NOTE — ED Notes (Signed)
 Preparing to intubate with RT and Dr Rosalia Hammers

## 2023-05-09 NOTE — Progress Notes (Signed)
Patient transported from trauma C to MRI, on ventilator, without complications.

## 2023-05-09 NOTE — Procedures (Signed)
 Central Venous Catheter Insertion Procedure Note  Sampson Self  [id number]  December 26, 1945  Date:05/09/23  Time:7:18 PM    Provider Performing:Kaydon Creedon JAYSON Sharps   Procedure: Insertion of Non-tunneled Central Venous [phone]) with US  guidance (23062)   Indication(s) Medication administration  Consent Risks of the procedure as well as the alternatives and risks of each were explained to the patient and/or caregiver.  Consent for the procedure was obtained and is signed in the bedside chart  Anesthesia Topical only with 1% lidocaine    Timeout Verified patient identification, verified procedure, site/side was marked, verified correct patient position, special equipment/implants available, medications/allergies/relevant history reviewed, required imaging and test results available.  Sterile Technique Maximal sterile technique including full sterile barrier drape, hand hygiene, sterile gown, sterile gloves, mask, hair covering, sterile ultrasound probe cover (if used).  Procedure Description Area of catheter insertion was cleaned with chlorhexidine  and draped in sterile fashion.  With real-time ultrasound guidance a central venous catheter was placed into the right internal jugular vein. Nonpulsatile blood flow and easy flushing noted in all ports.  The catheter was sutured in place and sterile dressing applied.  Complications/Tolerance None; patient tolerated the procedure well. Chest X-ray is ordered to verify placement for internal jugular or subclavian cannulation.   Chest x-ray is not ordered for femoral cannulation.  EBL Minimal  Specimen(s) None   Sherlean Sharps AGACNP-BC   [facility] 05/09/2023, 7:19 PM  Please see [email or web address] for pager details.  From 7A-7P if no response, please call [phone]. After hours, please call ELink [phone].

## 2023-05-09 NOTE — ED Triage Notes (Signed)
 Patient was found by family in between wall and bed seizing and had not been seen for 2 days.  Patient is a diabetic.  Patient GCS 3 upon arrival on NRB 18g LAC with right gaze and pupils unequal. Patient is on plavix.    BBP 152/80 HR 112  Capno 24/28 100NRB  CBG high

## 2023-05-09 NOTE — Plan of Care (Signed)
  Problem: Respiratory: Goal: Will maintain a patent airway Outcome: Progressing Goal: Complications related to the disease process, condition or treatment will be avoided or minimized Outcome: Progressing   Problem: Clinical Measurements: Goal: Will remain free from infection Outcome: Progressing   Problem: Education: Goal: Knowledge of risk factors and measures for prevention of condition will improve Outcome: Not Progressing   Problem: Coping: Goal: Psychosocial and spiritual needs will be supported Outcome: Not Progressing   Problem: Education: Goal: Knowledge of General Education information will improve Description: Including pain rating scale, medication(s)/side effects and non-pharmacologic comfort measures Outcome: Not Progressing   Problem: Health Behavior/Discharge Planning: Goal: Ability to manage health-related needs will improve Outcome: Not Progressing

## 2023-05-09 NOTE — H&P (Signed)
 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 Per ED documentation, family had not heard from patient for the past two days. EMS was called out to patient's home by family and patient was found unresponsive and actively seizing at the side of the patient's bed. Patient's pupils were unequal with right deviation. Upon arrival to ED, patient was obtunded, CBG reading high, and on a 100% NRB. Patient was intubated by ED physician and PCCM consulted for further management.   Pertinent  Medical History  HTN Diabetes Type 2 Afib on eliquis   Rt BKA  Osteomyelitis  HLD   Significant Hospital Events: Including procedures, antibiotic start and stop dates in addition to other pertinent events   12/31 Found unresponsive, with active seizures per EMS. Intubated upon arrival to ED   Interim History / Subjective:  Intubated, minimal sedation   Objective   Blood pressure (!) 157/93, pulse (!) 102, temperature (!) 102.3 F (39.1 C), resp. rate 14, height 5' 11 (1.803 m), weight 88.5 kg, SpO2 100%.    Vent Mode: PRVC FiO2 (%):  [40 %-100 %] 40 % Set Rate:  [14 bmp-18 bmp] 14 bmp Vt Set:  [600 mL] 600 mL PEEP:  [5 cmH20] 5 cmH20 Plateau Pressure:  [17 cmH20] 17 cmH20   Intake/Output Summary (Last 24 hours) at 05/09/2023 1323 Last data filed at 05/09/2023 1214 Gross per 24 hour  Intake 1810 ml  Output --  Net 1810 ml   Filed Weights   05/09/23 1038  Weight: 88.5 kg    Examination: General: acutely ill older male, unresponsive on vent  HENT: Normocephalic, right pupil nonreactive 4+, left pupil 2+ reactive/sluggish,  Poor dentition, ETT, OG tube  Lungs: Clear, diminished, no respiratory distress on vent  Cardiovascular: s1, s2, irregular, atrial flutter, no MRG  Abdomen: BS hypoactive, OG tube  Extremities: no movement to painful stimuli, left elbow skin tear  Neuro: RASS -4, positive cough reflex  GU: Temp foley   Resolved Hospital Problem list   N/a   Assessment & Plan:  AMS-unresponsive in setting of DKA, Covid 19, seizure activity  Cannot rule out Meningitis at this time, unable to perform LP due DOAC MRI pending Intubated on full vent support  Blood glucose > 700 per BMET, anion gap open  Covid swab +, febrile  CK and Lactate elevated Lactate trending down  CT of head and cervical spine-negative for acute intracranial abnormality  P: Beta hydroxy, Procal ordered  MRI pending, EEG ordered  Endo tool with insulin  gtt, Q4 BMETs, Q8 betahydroxy   LR maintenance fluids 142ml/hr  Continue trending lactate, CK Continue VAP and PAD protocol, full vent support  RASS goal 0 to -1  O2 Sat goal 92-95%  Broad spectrum antibiotics ordered-vanc, cetriaxone, ampicillin   F/u with BC  Give tylenol  per orders, place on cooling blanket  Hold IV steroids for now due to hyperglycemia   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)  Baseline Cr 1-2  12/31 Cr 3.84  P:  Continue adequate renal perfusion  Avoid nephrotoxic meds  Monitor strict I/O Continue temp foley   Atrial Flutter/Afib on  eliquis   P: Continuous telemetry  Hold eliquis  for now  Place SCDs  Cancer of Sigmoid Colon Splenic flexure  Recent diagnosis Scheduled colectomy with [facility] 1/25  P:  Monitor, follow up out patient   HTN  HLD  P:  Hold antihypertensives, and statin for now   BPH P:  Hold flomax     Best Practice (right click and Reselect all SmartList Selections daily)   Diet/type: NPO DVT prophylaxis SCD Pressure ulcer(s): N/A GI prophylaxis: PPI Lines: N/A Foley:  Yes, and it is still needed Code  Status:  full code Last date of multidisciplinary goals of care discussion [ Patient's Niece [name] updated at beside] 12/31   Labs   CBC: Recent Labs  Lab 05/09/23 1034 05/09/23 1049 05/09/23 1126  WBC 10.3  --   --   NEUTROABS 8.5*  --   --   HGB 11.7* 12.6* 10.9*  HCT 35.2* 37.0* 32.0*  MCV 73.0*  --   --   PLT 177  --   --     Basic Metabolic Panel: Recent Labs  Lab 05/09/23 1034 05/09/23 1049 05/09/23 1126  NA 133* 135 135  K 3.5 3.6 3.5  CL 89* 93*  --   CO2 25  --   --   GLUCOSE 764* >700*  --   BUN 59* 55*  --   CREATININE 3.84* 3.70*  --   CALCIUM  9.2  --   --    GFR: Estimated Creatinine Clearance: 18.1 mL/min (A) (by C-G formula based on SCr of 3.7 mg/dL (H)). Recent Labs  Lab 05/09/23 1034 05/09/23 1049 05/09/23 1202  WBC 10.3  --   --   LATICACIDVEN  --  5.3* 3.5*    Liver Function Tests: Recent Labs  Lab 05/09/23 1034  AST 137*  ALT 46*  ALKPHOS 71  BILITOT 0.8  PROT 7.9  ALBUMIN  3.4*   No results for input(s): LIPASE, AMYLASE in the last 168 hours. Recent Labs  Lab 05/09/23 1042  AMMONIA 22    ABG    Component Value Date/Time   PHART 7.520 (H) 05/09/2023 1126   PCO2ART 34.8 05/09/2023 1126   PO2ART 434 (H) 05/09/2023 1126   HCO3 28.0 05/09/2023 1126   TCO2 29 05/09/2023 1126   O2SAT 100 05/09/2023 1126     Coagulation Profile: Recent Labs  Lab 05/09/23 1034  INR 1.2    Cardiac Enzymes: Recent Labs  Lab 05/09/23 1034  CKTOTAL 11,470*    HbA1C: Hgb A1c MFr Bld  Date/Time Value Ref Range Status  12/27/2022 01:30 AM 14.0 (H) 4.8 - 5.6 % Final    Comment:    (NOTE) Pre diabetes:          5.7%-6.4%  Diabetes:              >6.4%  Glycemic control for   <7.0% adults with diabetes   11/27/2020 04:56 AM 12.1 (H) 4.8 - 5.6 % Final    Comment:    (NOTE)         Prediabetes: 5.7 - 6.4         Diabetes: >6.4         Glycemic control for adults with diabetes: <7.0     CBG: Recent Labs  Lab 05/09/23 1035  05/09/23 1220 05/09/23 1257  GLUCAP >600* >600* 579*    Review of Systems:     Past Medical History:  He,  has a past medical history of Diabetes mellitus without complication (HCC),  Hyperlipidemia, Hypertension, Osteomyelitis of right foot (HCC) (11/26/2020), and Sepsis (HCC) (11/27/2020).   Surgical History:   Past Surgical History:  Procedure Laterality Date   AMPUTATION Right 12/04/2020   Procedure: RIGHT BELOW KNEE AMPUTATION;  Surgeon: [name], MD;  Location: [facility] OR;  Service: Orthopedics;  Laterality: Right;   APPENDECTOMY     APPLICATION OF WOUND VAC Right 12/04/2020   Procedure: APPLICATION OF WOUND VAC;  Surgeon: [name], MD;  Location: [facility] OR;  Service: Orthopedics;  Laterality: Right;     Social History:   reports that he has never smoked. He has never used smokeless tobacco. He reports that he does not drink alcohol and does not use drugs.   Family History:  His family history includes Diabetes in his father and mother.   Allergies No Known Allergies   Home Medications  Prior to Admission medications   Medication Sig Start Date End Date Taking? Authorizing Provider  Vitamin D , Ergocalciferol , (DRISDOL ) 1.25 MG (50000 UNIT) CAPS capsule Take 50,000 Units by mouth every Sunday. 11/18/22  Yes [provider]  acetaminophen  (TYLENOL ) 325 MG tablet Take 2 tablets (650 mg total) by mouth every 6 (six) hours as needed for mild pain, headache or fever (or Fever >/= 101). 12/07/20   Gonfa, Taye T, MD  apixaban  (ELIQUIS ) 5 MG TABS tablet Take 1 tablet (5 mg total) by mouth 2 (two) times daily. 12/30/22  Yes Ghimire, Donalda HERO, MD  atorvastatin  (LIPITOR) 20 MG tablet Take 20 mg by mouth at bedtime. 09/14/20  Yes [provider]  Blood Glucose Monitoring Suppl (BLOOD GLUCOSE MONITOR SYSTEM) w/Device KIT 1 (one) Kit as directed 11/02/20     insulin  lispro (HUMALOG  KWIKPEN) 100 UNIT/ML KwikPen Inject 2-10 Units into the skin daily as needed (BS > 200 per).     [provider]  Insulin  Pen Needle (B-D UF III MINI PEN NEEDLES) 31G X 5 MM MISC 1 (one) Each daily 11/02/20     LANTUS  SOLOSTAR 100 UNIT/ML Solostar Pen Inject 26 Units into the skin daily. 02/19/23   Ghimire, Donalda HERO, MD  metoprolol  tartrate (LOPRESSOR ) 50 MG tablet Take 1 tablet (50 mg total) by mouth 2 (two) times daily. 12/30/22  Yes Ghimire, Donalda HERO, MD  potassium chloride  (KLOR-CON  M) 10 MEQ tablet Take 2 tablets (20 mEq total) by mouth daily. 02/19/23  Yes Ghimire, Donalda HERO, MD  sertraline  (ZOLOFT ) 50 MG tablet Take 50 mg by mouth daily. 07/18/22  Yes [provider]  tamsulosin  (FLOMAX ) 0.4 MG CAPS capsule Take 1 capsule (0.4 mg total) by mouth daily. 12/07/20  Yes Gonfa, Taye T, MD  torsemide  (DEMADEX ) 20 MG tablet Take 20 mg by mouth daily.   Yes [provider]  furosemide  (LASIX ) 20 MG tablet Take 1-2 tablets in the morning for your ankle and foot swelling. Patient not taking: No sig reported 11/12/20 11/27/20  Rolinda Rogue, MD     Critical care time: 60 mins     [facility]   [facility] 05/09/2023, 4:02 PM  Please see [email or web address] for pager details.  From 7A-7P if no response, please call [phone]. After hours, please call ELink [phone].

## 2023-05-09 NOTE — ED Provider Notes (Addendum)
 [place] [facility] Provider Note   CSN: [id number] Arrival date & time: 05/09/23  1032     History  Chief Complaint  Patient presents with   Altered Mental Status    Cory [name] is a 77 y.o. male.  HPI 77 year old male seen on arrival.  EMS reports that family called and had not been able to get in touch with him for 2 days.  First responders on scene reportedly found patient with active seizure wedged between bed and wall.  He was unresponsive.  He has had gaze to the right.  He is known diabetic and prehospital patient had elevated blood sugar.  He is reported to be on Plavix however they also did an old prescription for Eliquis .  Prehospital blood pressure 152/80 heart rate 112 capnography 24.  No report of prior surgeries Reviewed problem list hypertension hyperlipidemia hyperglycemia AAA and history of metabolic encephalopathy Primary care is listed as Dr. Jerrye at [facility] medical   Reviewed discharge summary from admission in October where patient was noted to have a history of right BKA, atrial flutter, type 2 diabetes hypertension hyperlipidemia and had been admitted when he felt dizzy and had an AKI.  At that time head CT and brain MRI showed no acute CVA or acute intracranial abnormality, EF 55 to 60%, carotid Doppler without significant stenosis  Home Medications Prior to Admission medications   Medication Sig Start Date End Date Taking? Authorizing Provider  apixaban  (ELIQUIS ) 5 MG TABS tablet Take 1 tablet (5 mg total) by mouth 2 (two) times daily. 12/30/22  Yes Ghimire, Donalda HERO, MD  atorvastatin  (LIPITOR) 20 MG tablet Take 20 mg by mouth at bedtime. 09/14/20  Yes [provider]  metoprolol  tartrate (LOPRESSOR ) 50 MG tablet Take 1 tablet (50 mg total) by mouth 2 (two) times daily. 12/30/22  Yes Ghimire, Donalda HERO, MD  potassium chloride  (KLOR-CON  M) 10 MEQ tablet Take 2 tablets (20 mEq total) by mouth daily. 02/19/23  Yes  Ghimire, Donalda HERO, MD  sertraline  (ZOLOFT ) 50 MG tablet Take 50 mg by mouth daily. 07/18/22  Yes [provider]  tamsulosin  (FLOMAX ) 0.4 MG CAPS capsule Take 1 capsule (0.4 mg total) by mouth daily. 12/07/20  Yes Gonfa, Taye T, MD  torsemide  (DEMADEX ) 20 MG tablet Take 20 mg by mouth daily.   Yes [provider]  Vitamin D , Ergocalciferol , (DRISDOL ) 1.25 MG (50000 UNIT) CAPS capsule Take 50,000 Units by mouth every Sunday. 11/18/22  Yes [provider]  acetaminophen  (TYLENOL ) 325 MG tablet Take 2 tablets (650 mg total) by mouth every 6 (six) hours as needed for mild pain, headache or fever (or Fever >/= 101). 12/07/20   Gonfa, Taye T, MD  Blood Glucose Monitoring Suppl (BLOOD GLUCOSE MONITOR SYSTEM) w/Device KIT 1 (one) Kit as directed 11/02/20     insulin  lispro (HUMALOG  KWIKPEN) 100 UNIT/ML KwikPen Inject 2-10 Units into the skin daily as needed (BS > 200 per).    [provider]  Insulin  Pen Needle (B-D UF III MINI PEN NEEDLES) 31G X 5 MM MISC 1 (one) Each daily 11/02/20     LANTUS  SOLOSTAR 100 UNIT/ML Solostar Pen Inject 26 Units into the skin daily. 02/19/23   Ghimire, Donalda HERO, MD  furosemide  (LASIX ) 20 MG tablet Take 1-2 tablets in the morning for your ankle and foot swelling. Patient not taking: No sig reported 11/12/20 11/27/20  Rolinda Rogue, MD      Allergies    Patient  has no known allergies.    Review of Systems   Review of Systems  Physical Exam Updated Vital Signs BP (!) 157/93   Pulse (!) 102   Temp (!) 102.3 F (39.1 C)   Resp 14   Ht 1.803 m (5' 11)   Wt 88.5 kg   SpO2 100%   BMI 27.20 kg/m  Physical Exam Vitals reviewed.  Constitutional:      General: He is in acute distress.     Appearance: He is ill-appearing.     Comments: Patient is unresponsive Warm to touch and temp 102.4  HENT:     Head: Normocephalic and atraumatic.     Right Ear: External ear normal.     Left Ear: External ear normal.     Mouth/Throat:     Mouth:  Mucous membranes are dry.     Comments: Ride bloody secretions and yellowish discharge Eyes:     Comments: Pupils 3 nonreactive gaze to right  Neck:     Comments: No obvious external signs of trauma on neck collar placed Cardiovascular:     Rate and Rhythm: Regular rhythm. Tachycardia present.     Pulses: Normal pulses.     Heart sounds: Normal heart sounds.  Pulmonary:     Comments: Some gag Decreased respirations Abdominal:     Palpations: Abdomen is soft.  Musculoskeletal:     Comments: Right BKA Abrasion left thigh  Skin:    General: Skin is warm and dry.     Capillary Refill: Capillary refill takes less than 2 seconds.  Neurological:     Comments: Patient unresponsive even to painful stimuli Eyes deviated to right     ED Results / Procedures / Treatments   Labs (all labs ordered are listed, but only abnormal results are displayed) Labs Reviewed  RESP PANEL BY RT-PCR (RSV, FLU A&B, COVID)  RVPGX2 - Abnormal; Notable for the following components:      Result Value   SARS Coronavirus 2 by RT PCR POSITIVE (*)    All other components within normal limits  COMPREHENSIVE METABOLIC PANEL - Abnormal; Notable for the following components:   Sodium 133 (*)    Chloride 89 (*)    Glucose, Bld 764 (*)    BUN 59 (*)    Creatinine, Ser 3.84 (*)    Albumin  3.4 (*)    AST 137 (*)    ALT 46 (*)    GFR, Estimated 16 (*)    Anion gap 19 (*)    All other components within normal limits  CBC WITH DIFFERENTIAL/PLATELET - Abnormal; Notable for the following components:   Hemoglobin 11.7 (*)    HCT 35.2 (*)    MCV 73.0 (*)    MCH 24.3 (*)    Neutro Abs 8.5 (*)    All other components within normal limits  PROTIME-INR - Abnormal; Notable for the following components:   Prothrombin Time 15.7 (*)    All other components within normal limits  URINALYSIS, W/ REFLEX TO CULTURE (INFECTION SUSPECTED) - Abnormal; Notable for the following components:   APPearance HAZY (*)    Glucose, UA  >=500 (*)    Hgb urine dipstick LARGE (*)    Ketones, ur 5 (*)    Protein, ur >=300 (*)    Leukocytes,Ua TRACE (*)    Bacteria, UA RARE (*)    All other components within normal limits  CK - Abnormal; Notable for the following components:   Total CK 11,470 (*)  All other components within normal limits  BETA-HYDROXYBUTYRIC ACID - Abnormal; Notable for the following components:   Beta-Hydroxybutyric Acid 1.98 (*)    All other components within normal limits  I-STAT ARTERIAL BLOOD GAS, ED - Abnormal; Notable for the following components:   pH, Arterial 7.520 (*)    pO2, Arterial 434 (*)    Acid-Base Excess 6.0 (*)    Calcium , Ion 1.09 (*)    HCT 32.0 (*)    Hemoglobin 10.9 (*)    All other components within normal limits  I-STAT CG4 LACTIC ACID, ED - Abnormal; Notable for the following components:   Lactic Acid, Venous 5.3 (*)    All other components within normal limits  CBG MONITORING, ED - Abnormal; Notable for the following components:   Glucose-Capillary >600 (*)    All other components within normal limits  I-STAT CHEM 8, ED - Abnormal; Notable for the following components:   Chloride 93 (*)    BUN 55 (*)    Creatinine, Ser 3.70 (*)    Glucose, Bld >700 (*)    Calcium , Ion 1.08 (*)    Hemoglobin 12.6 (*)    HCT 37.0 (*)    All other components within normal limits  I-STAT CG4 LACTIC ACID, ED - Abnormal; Notable for the following components:   Lactic Acid, Venous 3.5 (*)    All other components within normal limits  CBG MONITORING, ED - Abnormal; Notable for the following components:   Glucose-Capillary >600 (*)    All other components within normal limits  CBG MONITORING, ED - Abnormal; Notable for the following components:   Glucose-Capillary 579 (*)    All other components within normal limits  CBG MONITORING, ED - Abnormal; Notable for the following components:   Glucose-Capillary >600 (*)    All other components within normal limits  CULTURE, BLOOD (ROUTINE X  2)  CULTURE, BLOOD (ROUTINE X 2)  RESPIRATORY PANEL BY PCR  AMMONIA  BETA-HYDROXYBUTYRIC ACID    EKG EKG Interpretation Date/Time:  Tuesday May 09 2023 10:36:39 EST Ventricular Rate:  94 PR Interval:    QRS Duration:  86 QT Interval:  411 QTC Calculation: 514 R Axis:   -15  Text Interpretation: Atrial flutter/fibrillation Ventricular premature complex Borderline left axis deviation Diffuse Non-specific ST-t changes Prolonged QT interval Confirmed by [name] [id number]) on 05/09/2023 2:05:24 PM  Radiology CT Cervical Spine Wo Contrast Result Date: 05/09/2023 CLINICAL DATA:  Neck trauma EXAM: CT CERVICAL SPINE WITHOUT CONTRAST TECHNIQUE: Multidetector CT imaging of the cervical spine was performed without intravenous contrast. Multiplanar CT image reconstructions were also generated. RADIATION DOSE REDUCTION: This exam was performed according to the departmental dose-optimization program which includes automated exposure control, adjustment of the mA and/or kV according to patient size and/or use of iterative reconstruction technique. COMPARISON:  None Available. FINDINGS: Alignment: Normal Skull base and vertebrae: No acute fracture. No primary bone lesion or focal pathologic process. Soft tissues and spinal canal: No prevertebral fluid or swelling. No visible canal hematoma. Disc levels: Large flowing anterior and lateral osteophytes with partial fusion across all the cervical disc spaces. No disc herniation. Mild bilateral degenerative facet disease. Upper chest: No acute findings Other: None IMPRESSION: Diffuse degenerative changes with flowing anterior and lateral osteophytes. Partial fusion across the disc spaces. No acute bony abnormality. Electronically Signed   By: Franky Crease M.D.   On: 05/09/2023 14:00   CT Head Wo Contrast Result Date: 05/09/2023 CLINICAL DATA:  Altered mental status EXAM: CT HEAD  WITHOUT CONTRAST TECHNIQUE: Contiguous axial images were obtained from the  base of the skull through the vertex without intravenous contrast. RADIATION DOSE REDUCTION: This exam was performed according to the departmental dose-optimization program which includes automated exposure control, adjustment of the mA and/or kV according to patient size and/or use of iterative reconstruction technique. COMPARISON:  02/16/2023 FINDINGS: Brain: There is atrophy and chronic small vessel disease changes. No acute intracranial abnormality. Specifically, no hemorrhage, hydrocephalus, mass lesion, acute infarction, or significant intracranial injury. Vascular: No hyperdense vessel or unexpected calcification. Skull: No acute calvarial abnormality. Sinuses/Orbits: No acute findings Other: None IMPRESSION: Atrophy, chronic microvascular disease. No acute intracranial abnormality. Electronically Signed   By: Franky Crease M.D.   On: 05/09/2023 13:57   DG Abd Portable 1V Result Date: 05/09/2023 CLINICAL DATA:  OG tube placement EXAM: PORTABLE ABDOMEN - 1 VIEW COMPARISON:  Abdominal radiographs dated December 27, 2022. FINDINGS: Enteric tube tip and side port overlies the left upper abdominal quadrant in the expected region of the stomach. Nonobstructive bowel-gas pattern. No free air. IMPRESSION: Enteric tube tip and side port within the stomach. Electronically Signed   By: Harrietta Sherry M.D.   On: 05/09/2023 12:57   DG Chest Portable 1 View Result Date: 05/09/2023 CLINICAL DATA:  Seizure. EXAM: PORTABLE CHEST 1 VIEW COMPARISON:  Chest radiograph dated December 26, 2022. FINDINGS: Endotracheal tube tip is located 3.3 cm above the carina. Overlapping telemetry wires. The heart size and mediastinal contours are within normal limits. Mild left basilar atelectasis. No focal consolidation, sizeable pleural effusion, or pneumothorax. No acute osseous abnormality. IMPRESSION: 1. Endotracheal tube tip is located approximately 3.3 cm above the carina. 2. Mild left basilar atelectasis. Otherwise, no acute  cardiopulmonary findings. Electronically Signed   By: Harrietta Sherry M.D.   On: 05/09/2023 12:54    Procedures Date/Time: 05/09/2023 11:35 AM  Performed by: [name], MDPre-anesthesia Checklist: Patient identified Oxygen Delivery Method: Non-rebreather mask Preoxygenation: Pre-oxygenation with 100% oxygen Induction Type: Rapid sequence Tube size: 8.0 mm Number of attempts: 1 Airway Equipment and Method: Video-laryngoscopy Placement Confirmation: ETT inserted through vocal cords under direct vision, Breath sounds checked- equal and bilateral, Positive ETCO2 and CO2 detector Secured at: 25 cm Dental Injury: Teeth and Oropharynx as per pre-operative assessment     .Critical Care  Performed by: [name], MD Authorized by: [name], MD   Critical care provider statement:    Critical care time (minutes):  90   Critical care end time:  05/09/2023 2:09 PM   Critical care time was exclusive of:  Separately billable procedures and treating other patients and teaching time   Critical care was necessary to treat or prevent imminent or life-threatening deterioration of the following conditions:  Renal failure and respiratory failure   Critical care was time spent personally by me on the following activities:  Development of treatment plan with patient or surrogate, discussions with consultants, evaluation of patient's response to treatment, examination of patient, ordering and review of laboratory studies, ordering and review of radiographic studies, ordering and performing treatments and interventions, pulse oximetry, re-evaluation of patient's condition and review of old charts     Medications Ordered in ED Medications  lactated ringers  bolus 1,770 mL (1,770 mLs Intravenous See Procedure Record 05/09/23 1054)  etomidate  (AMIDATE ) injection (20 mg Intravenous Given 05/09/23 1044)  rocuronium  (ZEMURON ) injection (100 mg Intravenous Given 05/09/23 1045)  lactated ringers   infusion (0 mLs Intravenous Stopped 05/09/23 1155)  propofol  (DIPRIVAN ) 1000 MG/100ML infusion (10 mcg/kg/min  88.5 kg Intravenous New Bag/Given 05/09/23 1052)  midazolam  (VERSED ) 5 MG/5ML injection (4 mg Intravenous Given 05/09/23 1054)  fentaNYL  (SUBLIMAZE ) injection 25 mcg (25 mcg Intravenous Not Given 05/09/23 1216)  fentaNYL  in NS 250mL (10mcg/ml) infusion-PREMIX (50 mcg/hr Intravenous Infusion Verify 05/09/23 1335)  fentaNYL  (SUBLIMAZE ) bolus via infusion 25-100 mcg (100 mcg Intravenous Bolus from Bag 05/09/23 1209)  fentaNYL  (SUBLIMAZE ) injection 25 mcg (has no administration in time range)  fentaNYL  (SUBLIMAZE ) injection 25-100 mcg (has no administration in time range)  insulin  regular, human (MYXREDLIN ) 100 units/ 100 mL infusion (6.5 Units/hr Intravenous Infusion Verify 05/09/23 1335)  lactated ringers  infusion ( Intravenous Infusion Verify 05/09/23 1335)  dextrose  50 % solution 0-50 mL (has no administration in time range)  dextrose  5 % in lactated ringers  infusion (0 mLs Intravenous Hold 05/09/23 1216)  polyethylene glycol (MIRALAX  / GLYCOLAX ) packet 17 g (has no administration in time range)  heparin  injection 5,000 Units (has no administration in time range)  famotidine  (PEPCID ) tablet 20 mg (has no administration in time range)  cefTRIAXone  (ROCEPHIN ) 2 g in sodium chloride  0.9 % 100 mL IVPB (has no administration in time range)  ampicillin  (OMNIPEN) 2 g in sodium chloride  0.9 % 100 mL IVPB (has no administration in time range)  vancomycin  (VANCOREADY) IVPB 1750 mg/350 mL (has no administration in time range)  acyclovir  (ZOVIRAX ) 800 mg in dextrose  5 % 250 mL IVPB (has no administration in time range)  vancomycin  variable dose per unstable renal function (pharmacist dosing) (has no administration in time range)  piperacillin -tazobactam (ZOSYN ) IVPB 3.375 g (0 g Intravenous Stopped 05/09/23 1124)  acetaminophen  (TYLENOL ) suppository 650 mg (650 mg Rectal Given 05/09/23  1155)  levETIRAcetam  (KEPPRA ) IVPB 1500 mg/ 100 mL premix (1,500 mg Intravenous New Bag/Given 05/09/23 1317)    ED Course/ Medical Decision Making/ A&P Clinical Course as of 05/09/23 1410  Tue May 09, 2023  1122 Glucose reviewed interpreted greater than 700 [DR]  1122 And is 3.7 on i-STAT which is increased from first prior [DR]  1122 Lactic acid resulted at 5.3 -no notification given despite note [DR]  1123 Comment: NOTIFIED PHYSICIAN [DR]  1123 I-Stat Lactic Acid, ED(!!) [DR]  1124 Chest x-Artie Mcintyre reviewed interpreted ET tube in place no obvious consolidation noted.  Awaiting radiologist interpretation [DR]  1403 Viral panel reviewed significant for COVID-positive Total CK is elevated 11,470 [DR]  1404 CT head and cervical spine reviewed interpreted no evidence of acute abnormality noted and radiologist interpretation concurs [DR]    Clinical Course User Index [DR] [name], MD                                 Medical Decision Making Amount and/or Complexity of Data Reviewed Labs: ordered. Decision-making details documented in ED Course. Radiology: ordered.  Risk OTC drugs. Prescription drug management. Decision regarding hospitalization.   Patient initially evaluated and airway does not appear to be intact with poor respiratory effort Intubation please see note Patient is febrile and slightly tachycardic.  Pressure is elevated. Patient had labs including CBC, c-Met, lactic acid, blood cultures, ammonia level, INR obtained Chest x-Rhodie Cienfuegos postintubation with ET tube in place CT head and neck are pending It is noted to be extremely hyperglycemic and will start hyperglycemia orders once I have been able to review potassium Elevated lactic acid Broad-spectrum antibiotics and fluids initiated Awaiting results of head CT Discussed with critical care who will see for further  evaluation 1 acute delay of altered mental status-duration possibly up to 48 hours.  Patient had some  seizure activity on first responder evaluation.  Likely multiple etiologies.  Patient may be postictal but also has acute infection.  CT head without acute normality noted.  Multiple metabolic abnormalities including mild hyponatremia, severe hyperglycemia, worsening renal failure.  Keppra  was given here in the ED.  Other treatments to correct other metabolic etiologies ensuing. 2 fever-patient had blood cultures and broad-spectrum antibiotics.  Possible urinary tract infection.  COVID test is positive. 3-Hyperglycemia likely with associated DKA patient with known Enslin dependent diabetes.  Sugar started greater than 700 with beta hydroxybutyric acid elevated at 1.98.  Arterial pH 7.52. 4 sepsis patient with fever tachycardia.  Patient treated here with IV fluids and broad-spectrum antibiotics and improving lactic acid 5 atrial flutter patient with previous atrial flutter noted rate controlled here Critical care has seen and evaluated patient and will admit 6 patient intubated to protect airway        Final Clinical Impression(s) / ED Diagnoses Final diagnoses:  Sepsis with encephalopathy without septic shock, due to unspecified organism (HCC)  Altered mental status, unspecified altered mental status type    Rx / DC Orders ED Discharge Orders     None         [name], MD 05/09/23 1410    [name], MD 05/25/23 1629    [name], MD 05/25/23 1630

## 2023-05-09 NOTE — Progress Notes (Addendum)
 Pharmacy Antibiotic Note  Cory [name] is a 77 y.o. male admitted on 05/09/2023 with  suspected meninigitis .  Pharmacy has been consulted for vancomycin , acyclovir  dosing.  Plan: Vancomycin  1750mg  IV x1 then vancomycin  variable with AKI -vanc goal trough 15-70mcg/mL -levels PRN per protocol (VR 0500 tomorrow to guide dosing) Acyclovir  800mg  IV q24h --fluids on for endotool protocol at 125/hr F/u renal func, LOT, cultures  F/u LP and CSF cx as able   Height: 5' 11 (180.3 cm) Weight: 88.5 kg (195 lb) IBW/kg (Calculated) : 75.3  Temp (24hrs), Avg:102.3 F (39.1 C), Min:102.2 F (39 C), Max:102.4 F (39.1 C)  Recent Labs  Lab 05/09/23 1034 05/09/23 1049 05/09/23 1202  WBC 10.3  --   --   CREATININE 3.84* 3.70*  --   LATICACIDVEN  --  5.3* 3.5*    Estimated Creatinine Clearance: 18.1 mL/min (A) (by C-G formula based on SCr of 3.7 mg/dL (H)).    No Known Allergies  Antimicrobials this admission: Vanc 12/31> Zosyn  12/31 x1 Acyclovir  12/31> Ampicillin  12/31> Ceftriaxone  2g IV q12 (12/31> )   Dose adjustments this admission:   Microbiology results: 12/31 [facility] 12/31 RVP   Thank you for allowing pharmacy to be a part of this patient's care.  Sharyne Glatter, PharmD, BCCCP Clinical Pharmacist 05/09/2023 1:21 PM

## 2023-05-09 NOTE — Progress Notes (Signed)
 Post intubation ABG obtained on ventilator settings of VT: 600, RR: 18, FIO2: 100%, and PEEP: 5.  Decreased RR to 14 and FIO2 to 40%.  Patient also transported to CT and back to trauma room C, on ventilator, without complications.   Latest Reference Range & Units 05/09/23 11:26  Sample type  ARTERIAL  pH, Arterial 7.35 - 7.45  7.520 (H)  pCO2 arterial 32 - 48 mmHg 34.8  pO2, Arterial 83 - 108 mmHg 434 (H)  TCO2 22 - 32 mmol/L 29  Acid-Base Excess 0.0 - 2.0 mmol/L 6.0 (H)  Bicarbonate 20.0 - 28.0 mmol/L 28.0  O2 Saturation % 100  Patient temperature  102.4 F  Collection site  RADIAL, ALLEN'S TEST ACCEPTABLE

## 2023-05-10 DIAGNOSIS — E1110 Type 2 diabetes mellitus with ketoacidosis without coma: Secondary | ICD-10-CM | POA: Diagnosis not present

## 2023-05-10 DIAGNOSIS — R579 Shock, unspecified: Secondary | ICD-10-CM

## 2023-05-10 DIAGNOSIS — G9340 Encephalopathy, unspecified: Secondary | ICD-10-CM | POA: Diagnosis not present

## 2023-05-10 LAB — BASIC METABOLIC PANEL
Anion gap: 14 (ref 5–15)
Anion gap: 12 (ref 5–15)
Anion gap: 12 (ref 5–15)
BUN: 60 mg/dL — ABNORMAL HIGH (ref 8–23)
BUN: 58 mg/dL — ABNORMAL HIGH (ref 8–23)
BUN: 60 mg/dL — ABNORMAL HIGH (ref 8–23)
CO2: 24 mmol/L (ref 22–32)
CO2: 24 mmol/L (ref 22–32)
CO2: 25 mmol/L (ref 22–32)
Calcium: 8.4 mg/dL — ABNORMAL LOW (ref 8.9–10.3)
Calcium: 8.4 mg/dL — ABNORMAL LOW (ref 8.9–10.3)
Calcium: 7.8 mg/dL — ABNORMAL LOW (ref 8.9–10.3)
Chloride: 102 mmol/L (ref 98–111)
Chloride: 104 mmol/L (ref 98–111)
Chloride: 104 mmol/L (ref 98–111)
Creatinine, Ser: 4.46 mg/dL — ABNORMAL HIGH (ref 0.61–1.24)
Creatinine, Ser: 4.47 mg/dL — ABNORMAL HIGH (ref 0.61–1.24)
Creatinine, Ser: 4.87 mg/dL — ABNORMAL HIGH (ref 0.61–1.24)
GFR, Estimated: 13 mL/min — ABNORMAL LOW (ref >60)
GFR, Estimated: 13 mL/min — ABNORMAL LOW (ref >60)
GFR, Estimated: 12 mL/min — ABNORMAL LOW (ref >60)
Glucose, Bld: 171 mg/dL — ABNORMAL HIGH (ref 70–99)
Glucose, Bld: 145 mg/dL — ABNORMAL HIGH (ref 70–99)
Glucose, Bld: 186 mg/dL — ABNORMAL HIGH (ref 70–99)
Potassium: 3.5 mmol/L (ref 3.5–5.1)
Potassium: 3.9 mmol/L (ref 3.5–5.1)
Potassium: 3.2 mmol/L — ABNORMAL LOW (ref 3.5–5.1)
Sodium: 140 mmol/L (ref 135–145)
Sodium: 140 mmol/L (ref 135–145)
Sodium: 141 mmol/L (ref 135–145)

## 2023-05-10 LAB — POCT I-STAT 7, (LYTES, BLD GAS, ICA,H+H)
Acid-Base Excess: 1 mmol/L (ref 0.0–2.0)
Bicarbonate: 25.4 mmol/L (ref 20.0–28.0)
Calcium, Ion: 1.11 mmol/L — ABNORMAL LOW (ref 1.15–1.40)
HCT: 26 % — ABNORMAL LOW (ref 39.0–52.0)
Hemoglobin: 8.8 g/dL — ABNORMAL LOW (ref 13.0–17.0)
O2 Saturation: 100 %
Patient temperature: 98.2
Potassium: 3.2 mmol/L — ABNORMAL LOW (ref 3.5–5.1)
Sodium: 142 mmol/L (ref 135–145)
TCO2: 27 mmol/L (ref 22–32)
pCO2 arterial: 36.1 mm[Hg] (ref 32–48)
pH, Arterial: 7.455 — ABNORMAL HIGH (ref 7.35–7.45)
pO2, Arterial: 190 mm[Hg] — ABNORMAL HIGH (ref 83–108)

## 2023-05-10 LAB — CBC
HCT: 29.6 % — ABNORMAL LOW (ref 39.0–52.0)
Hemoglobin: 9.8 g/dL — ABNORMAL LOW (ref 13.0–17.0)
MCH: 24 pg — ABNORMAL LOW (ref 26.0–34.0)
MCHC: 33.1 g/dL (ref 30.0–36.0)
MCV: 72.4 fL — ABNORMAL LOW (ref 80.0–100.0)
Platelets: 167 10*3/uL (ref 150–400)
RBC: 4.09 MIL/uL — ABNORMAL LOW (ref 4.22–5.81)
RDW: 14.6 % (ref 11.5–15.5)
WBC: 10.7 10*3/uL — ABNORMAL HIGH (ref 4.0–10.5)
nRBC: 0 % (ref 0.0–0.2)

## 2023-05-10 LAB — C-REACTIVE PROTEIN: CRP: 16.6 mg/dL — ABNORMAL HIGH (ref <1.0)

## 2023-05-10 LAB — GLUCOSE, CAPILLARY
Glucose-Capillary: 139 mg/dL — ABNORMAL HIGH (ref 70–99)
Glucose-Capillary: 146 mg/dL — ABNORMAL HIGH (ref 70–99)
Glucose-Capillary: 148 mg/dL — ABNORMAL HIGH (ref 70–99)
Glucose-Capillary: 148 mg/dL — ABNORMAL HIGH (ref 70–99)
Glucose-Capillary: 149 mg/dL — ABNORMAL HIGH (ref 70–99)
Glucose-Capillary: 157 mg/dL — ABNORMAL HIGH (ref 70–99)
Glucose-Capillary: 170 mg/dL — ABNORMAL HIGH (ref 70–99)
Glucose-Capillary: 175 mg/dL — ABNORMAL HIGH (ref 70–99)
Glucose-Capillary: 190 mg/dL — ABNORMAL HIGH (ref 70–99)
Glucose-Capillary: 191 mg/dL — ABNORMAL HIGH (ref 70–99)
Glucose-Capillary: 193 mg/dL — ABNORMAL HIGH (ref 70–99)
Glucose-Capillary: 196 mg/dL — ABNORMAL HIGH (ref 70–99)
Glucose-Capillary: 200 mg/dL — ABNORMAL HIGH (ref 70–99)
Glucose-Capillary: 204 mg/dL — ABNORMAL HIGH (ref 70–99)
Glucose-Capillary: 213 mg/dL — ABNORMAL HIGH (ref 70–99)
Glucose-Capillary: 253 mg/dL — ABNORMAL HIGH (ref 70–99)
Glucose-Capillary: 92 mg/dL (ref 70–99)

## 2023-05-10 LAB — PHOSPHORUS
Phosphorus: 2.1 mg/dL — ABNORMAL LOW (ref 2.5–4.6)
Phosphorus: 1.9 mg/dL — ABNORMAL LOW (ref 2.5–4.6)
Phosphorus: 1.9 mg/dL — ABNORMAL LOW (ref 2.5–4.6)

## 2023-05-10 LAB — RESPIRATORY PANEL BY PCR

## 2023-05-10 LAB — VANCOMYCIN, RANDOM: Vancomycin Rm: 29 ug/mL

## 2023-05-10 LAB — BETA-HYDROXYBUTYRIC ACID: Beta-Hydroxybutyric Acid: 0.06 mmol/L (ref 0.05–0.27)

## 2023-05-10 LAB — CK TOTAL AND CKMB (NOT AT ARMC)
CK, MB: 21.8 ng/mL — ABNORMAL HIGH (ref 0.5–5.0)
Total CK: 8829 U/L — ABNORMAL HIGH (ref 49–397)

## 2023-05-10 LAB — MAGNESIUM: Magnesium: 2.4 mg/dL (ref 1.7–2.4)

## 2023-05-10 LAB — COOXEMETRY PANEL
Carboxyhemoglobin: 1.8 % — ABNORMAL HIGH (ref 0.5–1.5)
Methemoglobin: <0.7 % (ref 0.0–1.5)
O2 Saturation: 66.5 %
Total hemoglobin: 8.5 g/dL — ABNORMAL LOW (ref 12.0–16.0)

## 2023-05-10 LAB — LACTIC ACID, PLASMA: Lactic Acid, Venous: 2 mmol/L (ref 0.5–1.9)

## 2023-05-10 MED ORDER — SODIUM PHOSPHATES 45 MMOLE/15ML IV SOLN
15.0000 mmol | Freq: Once | INTRAVENOUS | Status: AC
  Administered 2023-05-10: 15 mmol via INTRAVENOUS
  Filled 2023-05-10: qty 5

## 2023-05-10 MED ORDER — INSULIN ASPART 100 UNIT/ML IJ SOLN
10.0000 [IU] | INTRAMUSCULAR | Status: DC
  Administered 2023-05-10 – 2023-05-13 (×7): 10 [IU] via SUBCUTANEOUS

## 2023-05-10 MED ORDER — DEXTROSE 10 % IV SOLN: INTRAVENOUS | Status: DC | PRN

## 2023-05-10 MED ORDER — CALCIUM GLUCONATE-NACL 1-0.675 GM/50ML-% IV SOLN
1.0000 g | Freq: Once | INTRAVENOUS | Status: AC
  Administered 2023-05-10: 1000 mg via INTRAVENOUS
  Filled 2023-05-10: qty 50

## 2023-05-10 MED ORDER — LACTATED RINGERS IV BOLUS
1000.0000 mL | Freq: Once | INTRAVENOUS | Status: AC
  Administered 2023-05-10: 1000 mL via INTRAVENOUS

## 2023-05-10 MED ORDER — INSULIN DETEMIR 100 UNIT/ML ~~LOC~~ SOLN
29.0000 [IU] | Freq: Two times a day (BID) | SUBCUTANEOUS | Status: DC
Start: 2023-05-10 — End: 2023-05-11
  Administered 2023-05-10 – 2023-05-11 (×3): 29 [IU] via SUBCUTANEOUS
  Filled 2023-05-10 (×4): qty 0.29

## 2023-05-10 MED ORDER — AMIODARONE IV BOLUS ONLY 150 MG/100ML
150.0000 mg | Freq: Once | INTRAVENOUS | Status: AC
  Administered 2023-05-10: 150 mg via INTRAVENOUS
  Filled 2023-05-10: qty 100

## 2023-05-10 MED ORDER — POTASSIUM & SODIUM PHOSPHATES 280-160-250 MG PO PACK
2.0000 | PACK | Freq: Once | ORAL | Status: DC
  Filled 2023-05-10: qty 2

## 2023-05-10 MED ORDER — PROSOURCE TF20 ENFIT COMPATIBL EN LIQD
60.0000 mL | Freq: Every day | ENTERAL | Status: DC
  Administered 2023-05-10 – 2023-05-13 (×4): 60 mL
  Filled 2023-05-10 (×4): qty 60

## 2023-05-10 MED ORDER — INSULIN ASPART 100 UNIT/ML IJ SOLN
1.0000 [IU] | INTRAMUSCULAR | Status: DC
  Administered 2023-05-10: 2 [IU] via SUBCUTANEOUS
  Administered 2023-05-10 (×2): 1 [IU] via SUBCUTANEOUS
  Administered 2023-05-11 (×3): 2 [IU] via SUBCUTANEOUS
  Administered 2023-05-11 – 2023-05-12 (×3): 1 [IU] via SUBCUTANEOUS
  Administered 2023-05-13: 2 [IU] via SUBCUTANEOUS
  Administered 2023-05-13: 1 [IU] via SUBCUTANEOUS

## 2023-05-10 MED ORDER — VITAL 1.5 CAL PO LIQD
1000.0000 mL | ORAL | Status: DC
Start: 2023-05-10 — End: 2023-05-10

## 2023-05-10 MED ORDER — SODIUM CHLORIDE 0.9 % IV SOLN
200.0000 mg | Freq: Once | INTRAVENOUS | Status: AC
  Administered 2023-05-10: 200 mg via INTRAVENOUS
  Filled 2023-05-10: qty 40

## 2023-05-10 MED ORDER — POTASSIUM CHLORIDE 20 MEQ PO PACK
40.0000 meq | PACK | Freq: Once | ORAL | Status: AC
  Administered 2023-05-10: 40 meq
  Filled 2023-05-10: qty 2

## 2023-05-10 MED ORDER — VITAL 1.5 CAL PO LIQD
1000.0000 mL | ORAL | Status: DC
  Administered 2023-05-10 – 2023-05-12 (×3): 1000 mL

## 2023-05-10 MED ORDER — CHLORHEXIDINE GLUCONATE CLOTH 2 % EX PADS
6.0000 | MEDICATED_PAD | Freq: Every day | CUTANEOUS | Status: DC
  Administered 2023-05-10 – 2023-05-22 (×14): 6 via TOPICAL

## 2023-05-10 MED ORDER — SODIUM CHLORIDE 0.9 % IV SOLN
100.0000 mg | Freq: Every day | INTRAVENOUS | Status: AC
  Administered 2023-05-11 – 2023-05-12 (×2): 100 mg via INTRAVENOUS
  Filled 2023-05-10 (×2): qty 20

## 2023-05-10 MED ORDER — SODIUM CHLORIDE 0.9 % IV SOLN
2.0000 g | INTRAVENOUS | Status: AC
Start: 2023-05-11 — End: 2023-05-13
  Administered 2023-05-11 – 2023-05-13 (×3): 2 g via INTRAVENOUS
  Filled 2023-05-10 (×3): qty 20

## 2023-05-10 MED ORDER — POTASSIUM CHLORIDE CRYS ER 20 MEQ PO TBCR: 40.0000 meq | EXTENDED_RELEASE_TABLET | Freq: Once | ORAL | Status: DC

## 2023-05-10 MED ORDER — THIAMINE MONONITRATE 100 MG PO TABS
100.0000 mg | ORAL_TABLET | Freq: Every day | ORAL | Status: DC
  Administered 2023-05-10 – 2023-05-13 (×4): 100 mg
  Filled 2023-05-10 (×4): qty 1

## 2023-05-10 MED ORDER — POTASSIUM & SODIUM PHOSPHATES 280-160-250 MG PO PACK
2.0000 | PACK | Freq: Once | ORAL | Status: AC
  Administered 2023-05-10: 2
  Filled 2023-05-10: qty 2

## 2023-05-10 MED ORDER — SODIUM CHLORIDE 0.9 % IV SOLN: 2.0000 g | INTRAVENOUS | Status: DC

## 2023-05-10 MED ORDER — DEXMEDETOMIDINE HCL IN NACL 400 MCG/100ML IV SOLN
0.0000 ug/kg/h | INTRAVENOUS | Status: DC
  Administered 2023-05-10: 0.2 ug/kg/h via INTRAVENOUS
  Administered 2023-05-10: 0.4 ug/kg/h via INTRAVENOUS
  Administered 2023-05-10: 0.2 ug/kg/h via INTRAVENOUS
  Administered 2023-05-11: 0.6 ug/kg/h via INTRAVENOUS
  Administered 2023-05-11: 0.9 ug/kg/h via INTRAVENOUS
  Administered 2023-05-11: 0.6 ug/kg/h via INTRAVENOUS
  Administered 2023-05-12: 0.7 ug/kg/h via INTRAVENOUS
  Administered 2023-05-12: 1.2 ug/kg/h via INTRAVENOUS
  Administered 2023-05-12: 1 ug/kg/h via INTRAVENOUS
  Administered 2023-05-12 – 2023-05-13 (×3): 1.2 ug/kg/h via INTRAVENOUS
  Filled 2023-05-10 (×12): qty 100

## 2023-05-10 MED ORDER — LACTATED RINGERS IV BOLUS: 1000.0000 mL | Freq: Once | INTRAVENOUS | Status: DC

## 2023-05-10 MED ORDER — ADULT MULTIVITAMIN W/MINERALS CH
1.0000 | ORAL_TABLET | Freq: Every day | ORAL | Status: DC
  Administered 2023-05-10 – 2023-05-14 (×5): 1
  Filled 2023-05-10 (×5): qty 1

## 2023-05-10 NOTE — Plan of Care (Signed)
  Problem: Education: Goal: Knowledge of risk factors and measures for prevention of condition will improve Outcome: Not Progressing   Problem: Coping: Goal: Psychosocial and spiritual needs will be supported Outcome: Not Progressing   

## 2023-05-10 NOTE — Progress Notes (Signed)
 CRITICAL VALUE STICKER  CRITICAL VALUE: Lactic acid 2.0  RECEIVER (on-site recipient of call): E. Ahmed  DATE & TIME NOTIFIED: 05/10/23 @1245   MESSENGER (representative from lab): unknown   MD NOTIFIED: [name]   TIME OF NOTIFICATION: 1305  RESPONSE:  lactic is trending down

## 2023-05-10 NOTE — Progress Notes (Signed)
 Evening labs  ABG without acidosis Iooized calcium  low Creat pending Coox 66  Plan  -await bmet - replete with calcium  gluconate    LABS    PULMONARY Recent Labs  Lab 05/09/23 1049 05/09/23 1126 05/10/23 1652 05/10/23 1828  PHART  --  7.520*  --  7.455*  PCO2ART  --  34.8  --  36.1  PO2ART  --  434*  --  190*  HCO3  --  28.0  --  25.4  TCO2 27 29  --  27  O2SAT  --  100 66.5 100    CBC Recent Labs  Lab 05/09/23 1034 05/09/23 1049 05/09/23 1126 05/10/23 0348 05/10/23 1828  HGB 11.7*   < > 10.9* 9.8* 8.8*  HCT 35.2*   < > 32.0* 29.6* 26.0*  WBC 10.3  --   --  10.7*  --   PLT 177  --   --  167  --    < > = values in this interval not displayed.    COAGULATION Recent Labs  Lab 05/09/23 1034  INR 1.2    CARDIAC  No results for input(s): TROPONINI in the last 168 hours. No results for input(s): PROBNP in the last 168 hours.   CHEMISTRY Recent Labs  Lab 05/09/23 1748 05/09/23 2311 05/10/23 0112 05/10/23 0348 05/10/23 1145 05/10/23 1828  NA 142 139 140 140 141 142  K 2.8* 3.3* 3.5 3.9 3.2* 3.2*  CL 103 102 102 104 104  --   CO2 23 25 24 24 25   --   GLUCOSE 356* 211* 171* 145* 186*  --   BUN 53* 60* 60* 58* 60*  --   CREATININE 3.52* 4.19* 4.46* 4.47* 4.87*  --   CALCIUM  8.1* 8.5* 8.4* 8.4* 7.8*  --   MG  --  2.4 2.4  --   --   --   PHOS  --   --  2.1* 1.9* 1.9*  --    Estimated Creatinine Clearance: 13.7 mL/min (A) (by C-G formula based on SCr of 4.87 mg/dL (H)).   LIVER Recent Labs  Lab 05/09/23 1034  AST 137*  ALT 46*  ALKPHOS 71  BILITOT 0.8  PROT 7.9  ALBUMIN  3.4*  INR 1.2     INFECTIOUS Recent Labs  Lab 05/09/23 1049 05/09/23 1202 05/09/23 1555 05/10/23 1145  LATICACIDVEN 5.3* 3.5*  --  2.0*  PROCALCITON  --   --  4.51  --      ENDOCRINE CBG (last 3)  Recent Labs    05/10/23 1146 05/10/23 1251 05/10/23 1525  GLUCAP 213* 149* 190*         IMAGING x48h  - image(s) personally visualized  -    highlighted in bold DG CHEST PORT 1 VIEW Result Date: 05/09/2023 CLINICAL DATA:  Central line placement EXAM: PORTABLE CHEST 1 VIEW COMPARISON:  05/09/2023 FINDINGS: Single frontal view of the chest demonstrates endotracheal tube overlying tracheal air column, tip 5 cm above carina. Enteric catheter passes below diaphragm, tip and side port projecting over the gastric fundus. Right internal jugular central venous catheter tip overlies superior vena cava. The cardiac silhouette is unremarkable. No acute airspace disease, effusion, or pneumothorax. No acute bony abnormalities. IMPRESSION: 1. Support devices as above. No complication after central venous catheter placement. 2. No acute intrathoracic process. Electronically Signed   By: Ozell Daring M.D.   On: 05/09/2023 21:31   MR BRAIN WO CONTRAST Result Date: 05/09/2023 CLINICAL DATA:  Provided history: Mental  status change, unknown cause. Patient found down, seizing. EXAM: MRI HEAD WITHOUT CONTRAST TECHNIQUE: Multiplanar, multiecho pulse sequences of the brain and surrounding structures were obtained without intravenous contrast. COMPARISON:  Head CT 05/09/2023.  Brain MRI 02/16/2023. FINDINGS: Brain: Moderate-to-advanced generalized cerebral atrophy. Mild cerebellar atrophy. Commensurate prominence of the ventricles and sulci. 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 Chronic infarct again noted within the anterior right frontal lobe white matter. Mild multifocal T2 FLAIR hyperintense signal abnormality elsewhere within the cerebral white matter and thalami, nonspecific but compatible chronic small vessel ischemic disease. Several chronic microhemorrhages within the supratentorial brain (predominantly within the deep gray nuclei). Tiny chronic infarct within the left cerebellar hemisphere, not definitively present on the prior MRI of 02/16/2023. No appreciable hippocampal size or  signal asymmetry. There is no acute infarct. No evidence of an intracranial mass. No extra-axial fluid collection. No midline shift. Vascular: Maintained flow voids within the proximal large arterial vessels. Skull and upper cervical spine: No focal worrisome marrow lesion. Incompletely assessed cervical spondylosis. C3-C4 ankylosis. Sinuses/Orbits: No mass or acute finding within the imaged orbits. Prior bilateral ocular lens replacement. Mild mucosal thickening within the bilateral ethmoid and sphenoid sinuses. IMPRESSION: 1.  No evidence of an acute intracranial abnormality. 2. 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 3. Chronic infarcts also present within the anterior right frontal lobe white matter and left cerebellar hemisphere. The tiny chronic left cerebellar infarct was not definitively present on the prior brain MRI 02/16/2023. 4. Background mild chronic small vessel ischemic changes within the cerebral white matter and thalami, similar to the prior brain MRI of 02/16/2023. 5. 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 6. Mild paranasal sinus mucosal thickening. Electronically Signed   By: Rockey Childs D.O.   On: 05/09/2023 17:04   EEG adult Result Date: 05/09/2023 Cory Arlin KIDD, MD     05/09/2023  4:50 PM Patient Name: Cory [name] MRN: [id number] Epilepsy Attending: [name] Cory Referring Physician/Provider: [name], NP Date: 05/09/2023 Duration: 22.03 mins Patient history: 78yo M with ams getting eeg to evaluate for seizure Level of alertness: comatose AEDs during EEG study: None Technical aspects: This EEG study was done with scalp electrodes positioned according to the 10-20 International system of electrode placement. Electrical activity was reviewed with band pass filter of 1-70Hz , sensitivity of 7 uV/mm,  display speed of 60mm/sec with a 60Hz  notched filter applied as appropriate. EEG data were recorded continuously and digitally stored.  Video monitoring was available and reviewed as appropriate. Description: EEG showed continuous generalized and maximal bifrontal 3 to 6 Hz theta-delta slowing. Hyperventilation and photic stimulation were not performed.   ABNORMALITY - Continuous slow, generalized and maximal bifrontal IMPRESSION: This study is suggestive of cortical dysfunction arising from bifrontal region likely secondary to underlying structural abnormality. Additionally there is severe diffuse encephalopathy. No seizures or epileptiform discharges were seen throughout the recording. [name] Cory   CT Cervical Spine Wo Contrast Result Date: 05/09/2023 CLINICAL DATA:  Neck trauma EXAM: CT CERVICAL SPINE WITHOUT CONTRAST TECHNIQUE: Multidetector CT imaging of the cervical spine was performed without intravenous contrast. Multiplanar CT image reconstructions were also generated. RADIATION DOSE REDUCTION: This exam was performed according to the departmental dose-optimization program which includes automated exposure control, adjustment of the mA and/or kV according to patient size and/or use of iterative reconstruction technique.  COMPARISON:  None Available. FINDINGS: Alignment: Normal Skull base and vertebrae: No acute fracture. No primary bone lesion or focal pathologic process. Soft tissues and spinal canal: No prevertebral fluid or swelling. No visible canal hematoma. Disc levels: Large flowing anterior and lateral osteophytes with partial fusion across all the cervical disc spaces. No disc herniation. Mild bilateral degenerative facet disease. Upper chest: No acute findings Other: None IMPRESSION: Diffuse degenerative changes with flowing anterior and lateral osteophytes. Partial fusion across the disc spaces. No acute bony abnormality. Electronically Signed   By: Franky Crease M.D.   On: 05/09/2023  14:00   CT Head Wo Contrast Result Date: 05/09/2023 CLINICAL DATA:  Altered mental status EXAM: CT HEAD WITHOUT CONTRAST TECHNIQUE: Contiguous axial images were obtained from the base of the skull through the vertex without intravenous contrast. RADIATION DOSE REDUCTION: This exam was performed according to the departmental dose-optimization program which includes automated exposure control, adjustment of the mA and/or kV according to patient size and/or use of iterative reconstruction technique. COMPARISON:  02/16/2023 FINDINGS: Brain: There is atrophy and chronic small vessel disease changes. No acute intracranial abnormality. Specifically, no hemorrhage, hydrocephalus, mass lesion, acute infarction, or significant intracranial injury. Vascular: No hyperdense vessel or unexpected calcification. Skull: No acute calvarial abnormality. Sinuses/Orbits: No acute findings Other: None IMPRESSION: Atrophy, chronic microvascular disease. No acute intracranial abnormality. Electronically Signed   By: Franky Crease M.D.   On: 05/09/2023 13:57   DG Abd Portable 1V Result Date: 05/09/2023 CLINICAL DATA:  OG tube placement EXAM: PORTABLE ABDOMEN - 1 VIEW COMPARISON:  Abdominal radiographs dated December 27, 2022. FINDINGS: Enteric tube tip and side port overlies the left upper abdominal quadrant in the expected region of the stomach. Nonobstructive bowel-gas pattern. No free air. IMPRESSION: Enteric tube tip and side port within the stomach. Electronically Signed   By: Harrietta Sherry M.D.   On: 05/09/2023 12:57   DG Chest Portable 1 View Result Date: 05/09/2023 CLINICAL DATA:  Seizure. EXAM: PORTABLE CHEST 1 VIEW COMPARISON:  Chest radiograph dated December 26, 2022. FINDINGS: Endotracheal tube tip is located 3.3 cm above the carina. Overlapping telemetry wires. The heart size and mediastinal contours are within normal limits. Mild left basilar atelectasis. No focal consolidation, sizeable pleural effusion, or  pneumothorax. No acute osseous abnormality. IMPRESSION: 1. Endotracheal tube tip is located approximately 3.3 cm above the carina. 2. Mild left basilar atelectasis. Otherwise, no acute cardiopulmonary findings. Electronically Signed   By: Harrietta Sherry M.D.   On: 05/09/2023 12:54

## 2023-05-10 NOTE — Progress Notes (Addendum)
    PM round via elink camera   A Fib is controlled after amio bolus Calm on ventilator on precedex  Is on low dose precedex . Of fent gtt RN says still only withdrawing and not anymore responisve   Levophed  needs improved after 2L fluid bolus Lactate better CK better   However, only made 150cc urine max since morning Creat worse   Plan 1 more liter bolus Get eeg x 1 Follow Check labs     SIGNATURE    Dr. Dorethia Cave, M.D., F.C.C.P,  Pulmonary and Critical Care Medicine Staff Physician, [facility] Center Director - Interstitial Lung Disease  Program  Pulmonary Fibrosis [facility] at [facility] [place], [place], [address]   Pager: [phone], If no answer  -> Check AMION or Try [phone] Telephone (clinical office): [phone] Telephone (research): [phone]  4:12 PM 05/10/2023

## 2023-05-10 NOTE — Progress Notes (Signed)
 Initial Nutrition Assessment  DOCUMENTATION CODES:  Not applicable  INTERVENTION:  Initiate tube feeding via OGT: Vital 1.5 at 55 ml/h (1320 ml per day) Start at 47mL/h and advance by 10mL q12h to goal of 55 Prosource TF20 60 ml 1x/d Provides 2060 kcal, 109 gm protein, 1008 ml free water daily Monitor magnesium  and phosphorus every 12 hours x 4 occurrences, MD to replete as needed, as pt is at risk for refeeding syndrome given unknown intake hx and labs with electrolyte abnormalities after D5 infusion. Thiamine  100mg  x 5 days  NUTRITION DIAGNOSIS:   Inadequate oral intake related to inability to eat as evidenced by NPO status.  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 Family had not heard from him in several days. First responders found pt wedged between bed and wall actively seizing. Intubated on arrival to ED.   RD working remotely  Patient is currently intubated on ventilator support, unable to discuss nutrition hx at this time. Pt found to be in DKA and positive for COVID19 on admission. Able to be transitioned off insulin  drip pending initiation of enteral feeds. OGT in place and confirmed gastric.   Dextrose  containing IVF infusing and pt showing electrolyte abnormalities which could be attributed to refeeding. Unsure of recent intake hx or diet quality but family had not seen pt in several days. Will initiate and advance TF slowly to allow for hemodynamic stability and correction of electrolytes. [name] already monitoring and ordering replacements this AM.   Discussed enteral plan with [facility] and RN.  MV: 8.3 L/min Temp (24hrs), Avg:99.6 F (37.6 C), Min:97.7 F (36.5 C), Max:102.6 F (39.2 C) MAP (art  line):  Admit / Current weight: 85 kg  ~4% weight loss noted in the last 3 months, not severe   Intake/Output Summary (Last 24 hours) at 05/10/2023 1015 Last data filed at 05/10/2023 9078 Gross per 24 hour  Intake 7819.3 ml  Output 770 ml  Net 7049.3 ml  Net IO Since Admission: 7,049.3 mL [05/10/23 1015]  Drains/Lines: Art line OGT (sideport in stomach per XR) CVC triple lumen, right IJ UOP x 24 hours  Nutritionally Relevant Medications: Scheduled Meds:  famotidine   20 mg Per Tube Daily   potassium & sodium phosphates   2 packet Per Tube Once   Continuous Infusions:  dexmedetomidine  (PRECEDEX ) IV infusion     dextrose  5% lactated ringers  125 mL/hr at 05/10/23 9078   insulin  6 Units/hr (05/10/23 0921)   lactated ringers  500 mL/hr at 05/10/23 0900   norepinephrine  (LEVOPHED ) Adult infusion 5 mcg/min (05/10/23 0921)   remdesivir  200 mg in sodium chloride  0.9% 250 mL IVPB     PRN Meds: polyethylene glycol  Labs Reviewed: BUN 58, creatinine 4.47 Phosphorus 1.9 CBG ranges from 139->600 mg/dL over the last 24 hours HgbA1c 14% (12/27/22)  NUTRITION - FOCUSED PHYSICAL EXAM: Defer to in-person assessment  Diet Order:   Diet Order             Diet NPO time specified  Diet effective now                   EDUCATION NEEDS:  Not appropriate for education at this  time  Skin:  Skin Assessment: Reviewed RN Assessment Skin tear: left elbow x 3  Posterior - (2x1 cm) Anterior - (2x1 cm) Distal - (4x1 cm)  Last BM:  PTA  Height:  Ht Readings from Last 1 Encounters:  05/09/23 5' 11 (1.803 m)    Weight:  Wt Readings from Last 1 Encounters:  05/10/23 85 kg    Ideal Body Weight:  73.6 kg (adjusted by 5.9% for BKA)  BMI:  Body mass index is 26.14 kg/m.  Estimated Nutritional Needs:  Kcal:  2100-2300 kcal/d Protein:  105-120g/d Fluid:  2.1-2.3L/d    Vernell Lukes, RD, LDN Registered Dietitian II Please reach out via secure chat Weekend on-call  pager # available in [facility]

## 2023-05-10 NOTE — Progress Notes (Addendum)
 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 Per ED documentation, family had not heard from patient for the past two days. EMS was called out to patient's home by family and patient was found unresponsive and actively seizing at the side of the patient's bed. Patient's pupils were unequal with right deviation. Upon arrival to ED, patient was obtunded, CBG reading high, and on a 100% NRB. Patient was intubated by ED physician and PCCM consulted for further management.   Pertinent  Medical History  HTN Diabetes Type 2 Afib on eliquis   Rt BKA  Osteomyelitis  HLD   Significant Hospital Events: Including procedures, antibiotic start and stop dates in addition to other pertinent events   12/31 Found unresponsive, with active seizures per EMS. Intubated upon arrival to ED  > Intubated, minimal sedation -COVID-positive Arterial line Right IJ central line Foley catheter   Interim History / Subjective:   05/10/23: Overnight healing candidate metoprolol  because of A-fib with RVR.  Transition to subcutaneous insulin  held off pending tube feeds..  Currently on insulin  infusion fentanyl  infusion and Levophed  drip 6mcg and D5 LR.  Is on ceftriaxone  and ampicillin  and acyclovir  for empiric meningitis coverage although clinically felt to have low odds for meningitis.  M MRI brain with chronic infarcts and chronic ischemic changes  CK 11,000 -> +ve 7L Volume -> CVP 7, straight leg raise test mean arterial pressure increased by 10 points on the A-line.  Minimal urine output BHOB 1.98 -> now normal  1.  No evidence of an acute  intracranial abnormality. 2. 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 3. Chronic infarcts also present within the anterior right frontal lobe white matter and left cerebellar hemisphere. The tiny chronic left cerebellar infarct was not definitively present on the prior brain MRI 02/16/2023. 4. Background mild chronic small vessel ischemic changes within the cerebral white matter and thalami, similar to the prior brain MRI of 02/16/2023. 5. 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 6. Mild paranasal sinus mucosal thickening.  Objective   Blood pressure 117/65, pulse 71, temperature 97.9 F (36.6 C), resp. rate 14, height 5' 11 (1.803 m), weight 85 kg, SpO2 100%. CVP:  [4 mmHg-7 mmHg] 5 mmHg  Vent Mode: PRVC FiO2 (%):  [40 %-100 %] 40 % Set Rate:  [14 bmp-18 bmp] 14 bmp Vt Set:  [600 mL] 600 mL PEEP:  [5 cmH20] 5 cmH20 Plateau Pressure:  [16 cmH20-20 cmH20] 16 cmH20   Intake/Output Summary (Last 24 hours) at 05/10/2023 0835 Last data filed at 05/10/2023 9187 Gross per 24 hour  Intake 7331.68 ml  Output 770 ml  Net 6561.68 ml   Filed Weights   05/09/23 1038 05/10/23 0500  Weight: 88.5 kg 85 kg    =  General Appearance:  Looks criticall ill OBESE - + Head:  Normocephalic, without obvious abnormality, atraumatic Eyes:  PERRL - yes, conjunctiva/corneas - muddy     Ears:  Normal external ear canals, both ears Nose:  G tube - no Throat:  ETT  TUBE - yes , OG tube - yes Neck:  Supple,  No enlargement/tenderness/nodules Lungs: Clear to auscultation bilaterally, Ventilator   Synchrony - yes Heart:  S1 and S2 normal, no murmur, CVP - 7.  Pressors - LEVOPHD 6 Abdomen:  Soft, no masses, no organomegaly Genitalia / Rectal:  Not done Extremities:  Extremities- LEFT AKA Skin:  ntact in exposed areas . Sacral  area -no decub but there is a small skin tear 5 mm in the left shin and also skin tear on the left elbow not otherwise specified Neurologic:  Sedation -fentanyl  infusion-> RASS --3/-4. Moves all 4s -no. CAM-ICU -cannot test. Orientation -not oriented.  Responded to movement of the left lower extremity with painful wincing      Resolved Hospital Problem list   N/a   Assessment & Plan:  AMS acute obtunded encephalopathy-unresponsive in setting of DKA, Covid 19, seizure activity .  MRI with chronic changes.  Very low probably for meningitis  05/10/2023:?  Slightly better mental status with DKA treatment.  But is on fentanyl  infusion  PLAN -DC meningitis coverage but leave the patient on ceftriaxone  -Treat DKA - Treat genital sepsis -Stop fentanyl  infusion and just do as needed - check UIDS - RASS goal 0 to 2   DKA severe  05/10/2023: Beta-hydroxybutyrate acid is normal P: -Start tube feeds and then changed to subcutaneous insulin  protocol   Rhabdomyolysis -CK 11,000 at admission - probably from being down  - CVP 7  Plan   - 2L bolus and then continue maintenance  Concern for severe sepsis -fever admission with SIRS and a white count of 10,700+ procalcitonin greater than 4 although in the setting of renal failure COVID positivity  05/10/2023: Fever curve is down but this is in the setting of scheduled IV Tylenol   Plan - DC meningitis coverage but leave ceftriaxone  on - Await cultures - Recheck procalcitonin as needed - Start remdesivir  -Hold off on any tocilizumab or Decadron  [chest x-ray without infiltrates]   Circulatory shock at admission probably combination of sepsis, dehydration with COVID, rhabdomyolysis CK 11,000 and.  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  Pretty much an uric but CVP is low   P:  2 L fluid bolus and reassess be met Continue adequate renal perfusion  Avoid nephrotoxic meds  Monitor strict I/O Continue temp foley   Atrial Flutter/Afib on eliquis   -last dose of Eliquis  was probably when he was down prior to admission. On coreg  and lopressor  at hom  -MRI with chronic microhemorrhages   P: Continuous telemetry  Hold eliquis  for now  Place SCDs Dc lopressor  in setting of levophed  heart rate   Cancer of Sigmoid Colon Splenic flexure  Recent diagnosis Scheduled colectomy with [facility] 1/25    P:  Monitor, follow up out patient   HTN  HLD  P:  Hold antihypertensives, and statin for now   BPH P:  Hold flomax     Best Practice (right click and Reselect all SmartList Selections daily)   Diet/type: NPO - >S tART TF DVT prophylaxis SCD Pressure ulcer(s):  -Has left elbow skin tear and a 5 mm skin tear in the left shin GI prophylaxis: PPI Lines: N/A Foley:  Yes, and it is  still needed Code Status:  full code Last date of multidisciplinary goals of care discussion [ Patient's Niece [name] updated at beside] 12/31    05/10/2023 call [name] Garnette Naegeli the niece [phone]  UPDATED OVER PHONE   ATTESTATION & SIGNATURE   The patient Mathias Bogacki is critically ill with multiple organ systems failure and requires high complexity decision making for assessment and support, frequent evaluation and titration of therapies, application of advanced monitoring technologies and extensive interpretation of multiple databases and discussion with other appropriate health care personnel such as bedside nurses, social workers, case production designer, theatre/television/film, consultants, respiratory therapists, nutritionists, secretaries etc.,  Critical care time includes but is not restricted to just documentation time. Documentation can happen in parallel or sequential to care time depending on case mix urgency and priorities for the  shift. So, overall critical Care Time devoted to patient care services described in this note is  35  Minutes.   This time reflects time of care of this signee Dr Dorethia Cave which includ does not reflect procedure time, or teaching time or supervisory time of PA/NP/Med student/Med Resident etc but could involve care discussion time     Dr. Dorethia Cave, M.D., [facility]C.P Pulmonary and Critical Care Medicine Staff Physician, [facility] [place] [facility] Pager: [phone], If no answer or between  15:00h - 7:00h: call [phone]  05/10/2023 8:35 AM    LABS    PULMONARY Recent Labs  Lab 05/09/23 1049 05/09/23 1126  PHART  --  7.520*  PCO2ART  --  34.8  PO2ART  --  434*  HCO3  --  28.0  TCO2 27 29  O2SAT  --  100    CBC Recent Labs  Lab 05/09/23 1034 05/09/23 1049 05/09/23 1126 05/10/23 0348  HGB 11.7* 12.6* 10.9* 9.8*  HCT 35.2* 37.0* 32.0* 29.6*  WBC 10.3  --   --  10.7*  PLT 177  --   --  167    COAGULATION Recent Labs  Lab 05/09/23 1034  INR 1.2    CARDIAC  No results for input(s): TROPONINI in the last 168 hours. No results for input(s): PROBNP in the last 168 hours.   CHEMISTRY Recent Labs  Lab 05/09/23 1034 05/09/23 1049 05/09/23 1126 05/09/23 1748 05/09/23 2311 05/10/23 0112 05/10/23 0348  NA 133* 135 135 142 139 140 140  K 3.5 3.6 3.5 2.8* 3.3* 3.5 3.9  CL 89* 93*  --  103 102 102 104  CO2 25  --   --  23 25 24 24   GLUCOSE 764* >700*  --  356* 211* 171* 145*  BUN 59* 55*  --  53* 60* 60* 58*  CREATININE 3.84* 3.70*  --  3.52* 4.19* 4.46* 4.47*  CALCIUM  9.2  --   --  8.1* 8.5* 8.4* 8.4*  MG  --   --   --   --  2.4 2.4  --   PHOS  --   --   --   --   --  2.1* 1.9*   Estimated Creatinine Clearance: 15 mL/min (A) (by C-G formula based on SCr of 4.47 mg/dL (H)).   LIVER Recent Labs  Lab 05/09/23 1034  AST 137*  ALT 46*  ALKPHOS 71  BILITOT 0.8  PROT 7.9  ALBUMIN  3.4*  INR 1.2      INFECTIOUS Recent Labs  Lab 05/09/23 1049 05/09/23 1202 05/09/23 1555  LATICACIDVEN 5.3* 3.5*  --  PROCALCITON  --   --  4.51     ENDOCRINE CBG (last 3)  Recent Labs    05/10/23 0632 05/10/23 0730 05/10/23 0816  GLUCAP 146* 175* 253*         IMAGING x48h  - image(s) personally visualized  -   highlighted in bold DG CHEST PORT 1 VIEW Result Date: 05/09/2023 CLINICAL DATA:  Central line placement EXAM: PORTABLE CHEST 1 VIEW COMPARISON:  05/09/2023 FINDINGS: Single frontal view of the chest demonstrates endotracheal tube overlying tracheal air column, tip 5 cm above carina. Enteric catheter passes below diaphragm, tip and side port projecting over the gastric fundus. Right internal jugular central venous catheter tip overlies superior vena cava. The cardiac silhouette is unremarkable. No acute airspace disease, effusion, or pneumothorax. No acute bony abnormalities. IMPRESSION: 1. Support devices as above. No complication after central venous catheter placement. 2. No acute intrathoracic process. Electronically Signed   By: Ozell Daring M.D.   On: 05/09/2023 21:31   MR BRAIN WO CONTRAST Result Date: 05/09/2023 CLINICAL DATA:  Provided history: Mental status change, unknown cause. Patient found down, seizing. EXAM: MRI HEAD WITHOUT CONTRAST TECHNIQUE: Multiplanar, multiecho pulse sequences of the brain and surrounding structures were obtained without intravenous contrast. COMPARISON:  Head CT 05/09/2023.  Brain MRI 02/16/2023. FINDINGS: Brain: Moderate-to-advanced generalized cerebral atrophy. Mild cerebellar atrophy. Commensurate prominence of the ventricles and sulci. 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 Chronic infarct again noted within the anterior right frontal lobe white matter. Mild multifocal T2 FLAIR hyperintense signal abnormality elsewhere within the cerebral white matter and thalami,  nonspecific but compatible chronic small vessel ischemic disease. Several chronic microhemorrhages within the supratentorial brain (predominantly within the deep gray nuclei). Tiny chronic infarct within the left cerebellar hemisphere, not definitively present on the prior MRI of 02/16/2023. No appreciable hippocampal size or signal asymmetry. There is no acute infarct. No evidence of an intracranial mass. No extra-axial fluid collection. No midline shift. Vascular: Maintained flow voids within the proximal large arterial vessels. Skull and upper cervical spine: No focal worrisome marrow lesion. Incompletely assessed cervical spondylosis. C3-C4 ankylosis. Sinuses/Orbits: No mass or acute finding within the imaged orbits. Prior bilateral ocular lens replacement. Mild mucosal thickening within the bilateral ethmoid and sphenoid sinuses. IMPRESSION: 1.  No evidence of an acute intracranial abnormality. 2. 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 3. Chronic infarcts also present within the anterior right frontal lobe white matter and left cerebellar hemisphere. The tiny chronic left cerebellar infarct was not definitively present on the prior brain MRI 02/16/2023. 4. Background mild chronic small vessel ischemic changes within the cerebral white matter and thalami, similar to the prior brain MRI of 02/16/2023. 5. 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 6. Mild paranasal sinus mucosal thickening. Electronically Signed   By: Rockey Childs D.O.   On: 05/09/2023 17:04   EEG adult Result Date: 05/09/2023 Shelton Arlin KIDD, MD     05/09/2023  4:50 PM Patient Name: Raymir Frommelt MRN: [id number] Epilepsy Attending: Arlin KIDD Shelton Referring Physician/Provider: [name], NP Date: 05/09/2023 Duration: 22.03 mins Patient history: 78yo M with ams  getting eeg to evaluate for seizure Level of alertness: comatose AEDs during EEG study: None Technical aspects: This EEG study was done with scalp electrodes positioned according to the 10-20 International system of electrode placement. Electrical activity  was reviewed with band pass filter of 1-70Hz , sensitivity of 7 uV/mm, display speed of 53mm/sec with a 60Hz  notched filter applied as appropriate. EEG data were recorded continuously and digitally stored.  Video monitoring was available and reviewed as appropriate. Description: EEG showed continuous generalized and maximal bifrontal 3 to 6 Hz theta-delta slowing. Hyperventilation and photic stimulation were not performed.   ABNORMALITY - Continuous slow, generalized and maximal bifrontal IMPRESSION: This study is suggestive of cortical dysfunction arising from bifrontal region likely secondary to underlying structural abnormality. Additionally there is severe diffuse encephalopathy. No seizures or epileptiform discharges were seen throughout the recording. Arlin MALVA Krebs   CT Cervical Spine Wo Contrast Result Date: 05/09/2023 CLINICAL DATA:  Neck trauma EXAM: CT CERVICAL SPINE WITHOUT CONTRAST TECHNIQUE: Multidetector CT imaging of the cervical spine was performed without intravenous contrast. Multiplanar CT image reconstructions were also generated. RADIATION DOSE REDUCTION: This exam was performed according to the departmental dose-optimization program which includes automated exposure control, adjustment of the mA and/or kV according to patient size and/or use of iterative reconstruction technique. COMPARISON:  None Available. FINDINGS: Alignment: Normal Skull base and vertebrae: No acute fracture. No primary bone lesion or focal pathologic process. Soft tissues and spinal canal: No prevertebral fluid or swelling. No visible canal hematoma. Disc levels: Large flowing anterior and lateral osteophytes with partial fusion across all the cervical disc spaces.  No disc herniation. Mild bilateral degenerative facet disease. Upper chest: No acute findings Other: None IMPRESSION: Diffuse degenerative changes with flowing anterior and lateral osteophytes. Partial fusion across the disc spaces. No acute bony abnormality. Electronically Signed   By: Franky Crease M.D.   On: 05/09/2023 14:00   CT Head Wo Contrast Result Date: 05/09/2023 CLINICAL DATA:  Altered mental status EXAM: CT HEAD WITHOUT CONTRAST TECHNIQUE: Contiguous axial images were obtained from the base of the skull through the vertex without intravenous contrast. RADIATION DOSE REDUCTION: This exam was performed according to the departmental dose-optimization program which includes automated exposure control, adjustment of the mA and/or kV according to patient size and/or use of iterative reconstruction technique. COMPARISON:  02/16/2023 FINDINGS: Brain: There is atrophy and chronic small vessel disease changes. No acute intracranial abnormality. Specifically, no hemorrhage, hydrocephalus, mass lesion, acute infarction, or significant intracranial injury. Vascular: No hyperdense vessel or unexpected calcification. Skull: No acute calvarial abnormality. Sinuses/Orbits: No acute findings Other: None IMPRESSION: Atrophy, chronic microvascular disease. No acute intracranial abnormality. Electronically Signed   By: Franky Crease M.D.   On: 05/09/2023 13:57   DG Abd Portable 1V Result Date: 05/09/2023 CLINICAL DATA:  OG tube placement EXAM: PORTABLE ABDOMEN - 1 VIEW COMPARISON:  Abdominal radiographs dated December 27, 2022. FINDINGS: Enteric tube tip and side port overlies the left upper abdominal quadrant in the expected region of the stomach. Nonobstructive bowel-gas pattern. No free air. IMPRESSION: Enteric tube tip and side port within the stomach. Electronically Signed   By: Harrietta Sherry M.D.   On: 05/09/2023 12:57   DG Chest Portable 1 View Result Date: 05/09/2023 CLINICAL DATA:  Seizure. EXAM: PORTABLE  CHEST 1 VIEW COMPARISON:  Chest radiograph dated December 26, 2022. FINDINGS: Endotracheal tube tip is located 3.3 cm above the carina. Overlapping telemetry wires. The heart size and mediastinal contours are within normal limits. Mild left basilar atelectasis. No focal consolidation, sizeable pleural effusion, or pneumothorax. No acute osseous abnormality. IMPRESSION: 1. Endotracheal tube tip is located approximately 3.3 cm above the carina. 2. Mild left basilar atelectasis. Otherwise, no acute cardiopulmonary findings.  Electronically Signed   By: Harrietta Sherry M.D.   On: 05/09/2023 12:54

## 2023-05-11 ENCOUNTER — Inpatient Hospital Stay (HOSPITAL_COMMUNITY): Payer: Medicare HMO

## 2023-05-11 DIAGNOSIS — R6520 Severe sepsis without septic shock: Secondary | ICD-10-CM | POA: Diagnosis not present

## 2023-05-11 DIAGNOSIS — A419 Sepsis, unspecified organism: Secondary | ICD-10-CM | POA: Diagnosis not present

## 2023-05-11 DIAGNOSIS — G9341 Metabolic encephalopathy: Secondary | ICD-10-CM | POA: Diagnosis not present

## 2023-05-11 DIAGNOSIS — R4182 Altered mental status, unspecified: Secondary | ICD-10-CM | POA: Diagnosis not present

## 2023-05-11 LAB — BETA-HYDROXYBUTYRIC ACID: Beta-Hydroxybutyric Acid: 0.19 mmol/L (ref 0.05–0.27)

## 2023-05-11 LAB — GLUCOSE, CAPILLARY
Glucose-Capillary: 133 mg/dL — ABNORMAL HIGH (ref 70–99)
Glucose-Capillary: 191 mg/dL — ABNORMAL HIGH (ref 70–99)
Glucose-Capillary: 152 mg/dL — ABNORMAL HIGH (ref 70–99)
Glucose-Capillary: 101 mg/dL — ABNORMAL HIGH (ref 70–99)
Glucose-Capillary: 102 mg/dL — ABNORMAL HIGH (ref 70–99)

## 2023-05-11 LAB — BASIC METABOLIC PANEL
Anion gap: 16 — ABNORMAL HIGH (ref 5–15)
Anion gap: 13 (ref 5–15)
BUN: 66 mg/dL — ABNORMAL HIGH (ref 8–23)
BUN: 70 mg/dL — ABNORMAL HIGH (ref 8–23)
CO2: 20 mmol/L — ABNORMAL LOW (ref 22–32)
CO2: 23 mmol/L (ref 22–32)
Calcium: 8 mg/dL — ABNORMAL LOW (ref 8.9–10.3)
Calcium: 7.8 mg/dL — ABNORMAL LOW (ref 8.9–10.3)
Chloride: 105 mmol/L (ref 98–111)
Chloride: 108 mmol/L (ref 98–111)
Creatinine, Ser: 5.31 mg/dL — ABNORMAL HIGH (ref 0.61–1.24)
Creatinine, Ser: 5.22 mg/dL — ABNORMAL HIGH (ref 0.61–1.24)
GFR, Estimated: 11 mL/min — ABNORMAL LOW (ref >60)
GFR, Estimated: 11 mL/min — ABNORMAL LOW (ref >60)
Glucose, Bld: 153 mg/dL — ABNORMAL HIGH (ref 70–99)
Glucose, Bld: 108 mg/dL — ABNORMAL HIGH (ref 70–99)
Potassium: 3.3 mmol/L — ABNORMAL LOW (ref 3.5–5.1)
Potassium: 3.9 mmol/L (ref 3.5–5.1)
Sodium: 141 mmol/L (ref 135–145)
Sodium: 144 mmol/L (ref 135–145)

## 2023-05-11 LAB — PROCALCITONIN: Procalcitonin: 7.25 ng/mL

## 2023-05-11 LAB — FOLATE: Folate: 7.1 ng/mL (ref >5.9)

## 2023-05-11 LAB — HIV ANTIBODY (ROUTINE TESTING W REFLEX): HIV Screen 4th Generation wRfx: NONREACTIVE

## 2023-05-11 LAB — CBC
HCT: 26.9 % — ABNORMAL LOW (ref 39.0–52.0)
Hemoglobin: 8.9 g/dL — ABNORMAL LOW (ref 13.0–17.0)
MCH: 23.9 pg — ABNORMAL LOW (ref 26.0–34.0)
MCHC: 33.1 g/dL (ref 30.0–36.0)
MCV: 72.3 fL — ABNORMAL LOW (ref 80.0–100.0)
Platelets: 118 10*3/uL — ABNORMAL LOW (ref 150–400)
RBC: 3.72 MIL/uL — ABNORMAL LOW (ref 4.22–5.81)
RDW: 15.2 % (ref 11.5–15.5)
WBC: 8.4 10*3/uL (ref 4.0–10.5)
nRBC: 0 % (ref 0.0–0.2)

## 2023-05-11 LAB — LACTIC ACID, PLASMA
Lactic Acid, Venous: 1.3 mmol/L (ref 0.5–1.9)
Lactic Acid, Venous: 1.1 mmol/L (ref 0.5–1.9)

## 2023-05-11 LAB — HEPATIC FUNCTION PANEL
ALT: 52 U/L — ABNORMAL HIGH (ref 0–44)
AST: 138 U/L — ABNORMAL HIGH (ref 15–41)
Albumin: 2 g/dL — ABNORMAL LOW (ref 3.5–5.0)
Alkaline Phosphatase: 44 U/L (ref 38–126)
Bilirubin, Direct: <0.1 mg/dL (ref 0.0–0.2)
Total Bilirubin: 0.3 mg/dL (ref 0.0–1.2)
Total Protein: 6 g/dL — ABNORMAL LOW (ref 6.5–8.1)

## 2023-05-11 LAB — PROTIME-INR
INR: 1.3 — ABNORMAL HIGH (ref 0.8–1.2)
Prothrombin Time: 16.3 s — ABNORMAL HIGH (ref 11.4–15.2)

## 2023-05-11 LAB — MAGNESIUM: Magnesium: 2.3 mg/dL (ref 1.7–2.4)

## 2023-05-11 LAB — AMMONIA: Ammonia: 14 umol/L (ref 9–35)

## 2023-05-11 LAB — PHOSPHORUS: Phosphorus: 4.5 mg/dL (ref 2.5–4.6)

## 2023-05-11 LAB — CK TOTAL AND CKMB (NOT AT ARMC)
CK, MB: 12.4 ng/mL — ABNORMAL HIGH (ref 0.5–5.0)
Total CK: 7048 U/L — ABNORMAL HIGH (ref 49–397)

## 2023-05-11 LAB — VITAMIN B12: Vitamin B-12: 817 pg/mL (ref 180–914)

## 2023-05-11 LAB — CK: Total CK: 5327 U/L — ABNORMAL HIGH (ref 49–397)

## 2023-05-11 LAB — TSH: TSH: 1.019 u[IU]/mL (ref 0.350–4.500)

## 2023-05-11 MED ORDER — POTASSIUM CHLORIDE 20 MEQ PO PACK
40.0000 meq | PACK | Freq: Once | ORAL | Status: AC
  Administered 2023-05-11: 40 meq
  Filled 2023-05-11: qty 2

## 2023-05-11 MED ORDER — LACTATED RINGERS IV SOLN: INTRAVENOUS | Status: AC

## 2023-05-11 MED ORDER — HEPARIN (PORCINE) 25000 UT/250ML-% IV SOLN
900.0000 [IU]/h | INTRAVENOUS | Status: DC
  Administered 2023-05-11: 1200 [IU]/h via INTRAVENOUS
  Administered 2023-05-12: 1000 [IU]/h via INTRAVENOUS
  Filled 2023-05-11 (×3): qty 250

## 2023-05-11 MED ORDER — INSULIN GLARGINE-YFGN 100 UNIT/ML ~~LOC~~ SOLN
29.0000 [IU] | Freq: Two times a day (BID) | SUBCUTANEOUS | Status: DC
  Administered 2023-05-11 – 2023-05-13 (×4): 29 [IU] via SUBCUTANEOUS
  Filled 2023-05-11 (×5): qty 0.29

## 2023-05-11 MED ORDER — LACTATED RINGERS IV BOLUS
1000.0000 mL | Freq: Once | INTRAVENOUS | Status: AC
  Administered 2023-05-11: 1000 mL via INTRAVENOUS

## 2023-05-11 NOTE — Progress Notes (Signed)
 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 Per ED documentation, family had not heard from patient for the past two days. EMS was called out to patient's home by family and patient was found unresponsive and actively seizing at the side of the patient's bed. Patient's pupils were unequal with right deviation. Upon arrival to ED, patient was obtunded, CBG reading high, and on a 100% NRB. Patient was intubated by ED physician and PCCM consulted for further management.   Pertinent  Medical History  HTN Diabetes Type 2 Afib on eliquis   Rt BKA  Osteomyelitis  HLD   Significant Hospital Events: Including procedures, antibiotic start and stop dates in addition to other pertinent events   12/31 Found unresponsive, with active seizures per EMS. Intubated upon arrival to ED  > Intubated, minimal sedation -COVID-positive Arterial line, Right IJ central line, Foley catheter.  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  Several chronic microhemorrhages and supratentorial brain 01/01 EEG with severe diffuse encephalopathy 01/02 renal function worsening  Interim History / Subjective:  Patient remains on low-dose Levophed .  On precedex  with fentanyl  injections. Right pupil 3 mm and left pupil 1 mm with no reaction to light.  Withdraws to pain in upper extremity but not in lower extremity.   Objective   Blood pressure 109/64, pulse 76, temperature 98.8 F (37.1 C), resp. rate 14, height 5' 11 (1.803 m), weight 91.1 kg, SpO2 100%. CVP:  [5 mmHg-9 mmHg] 9 mmHg  Vent Mode: PRVC FiO2 (%):  [40  %] 40 % Set Rate:  [14 bmp] 14 bmp Vt Set:  [600 mL] 600 mL PEEP:  [5 cmH20] 5 cmH20 Plateau Pressure:  [16 cmH20-20 cmH20] 17 cmH20   Intake/Output Summary (Last 24 hours) at 05/11/2023 0725 Last data filed at 05/11/2023 0525 Gross per 24 hour  Intake 5307.8 ml  Output 520 ml  Net 4787.8 ml   Filed Weights   05/09/23 1038 05/10/23 0500 05/11/23 0406  Weight: 88.5 kg 85 kg 91.1 kg   Net + 10L 120 cc of urine 01/01  Examination: General:  In bed on vent, pupil 3 mm, left pupil 1 mm nonreactive to light, corneal reflex intact, gag reflex intact HENT: NCAT ETT in place PULM: CTA B, vent supported breathing CV: RRR, no mgr GI: BS+, soft, nontender MSK: normal bulk and tone Neuro: sedated on vent  K 3.3 Bicarb 20 Anion gap 16 Creatinine worsening from 4.87 to 5.31 BUN from 60 to 66  Hgb stable 8.9 WBC 10.7 to 8.4 Platelets 167 to 118  Blood cx ngtd CK 7,000 Pro cal 7 Lactic acid 2 to 1.3 AST 128 ALT 52  Resolved Hospital Problem list   DKA  Assessment & Plan:   Acute encephalopathy Presumed UTI COVID + He continues to not follow commands. He is on precedex  with some fentanyl  pushes. He is being treated for UTI with ceftriaxone  and receiving remdesvir for COVID infection. Low suspicion for meningitis, ampicillin  discontinued 01/01 COVID + 12/31 EEG without seizures or epileptiform discharges MRI brain 12/31with chronic CVAs and chronic micro-hemorrhages.   Not  a candidate for SBT/ Extubation with mentation today.  -on Precedex , and fentanyl  pushes -continue ceftriaxone , day 3 -continue remdesivir   Anion gap metabolic acidosis Bicarb 20 with AG 16. DKA resolved with BHO wnl.  -check lactic acid  AKI on ckd stage 3b Rhabdomyolysis Baseline creatinine less than 2.  Creatinine is trending up to 5.31 . Low urine output 120 cc 01/01. Presented with rhabdo, will start fluids and trend CK. CK at 7,000 this am  -trend BMP -strict I and Os  Afib w  RVR Amiodarone  bolus given 01/01. Rates improved to 80s today.  Diabetes DKA- resolved Continue feeding supplement  -Levemir  29 units -SSI  Best Practice (right click and Reselect all SmartList Selections daily)   Diet/type: NPO w/ meds via tube DVT prophylaxis LMWH Pressure ulcer(s): N/A GI prophylaxis: H2B Lines: N/A Foley:  Yes, and it is still needed Code Status:  full code Last date of multidisciplinary goals of care discussion [pending]

## 2023-05-11 NOTE — Consult Note (Signed)
 NEUROLOGY CONSULT NOTE   Date of service: May 11, 2023 Patient Name: Cory [name] MRN:  [id number] DOB:  06-24-1945 Chief Complaint: encephalopathy Requesting Provider: [name], MD  History of Present Illness  Cory [name] is a 78 y.o. 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  He was intubated and transferred to the ICU for further evaluation and management.  Further workup demonstrated rhabdomyolysis, COVID positive, acute renal failure and circulatory shock on pressors.  He was initially started on empiric meningitis coverage but narrowed down to ceftriaxone .  His sedation has been weaned and is now on shows Precedex  0.6 for agitation.  He is slightly improved and waking up a little bit but has persistent encephalopathy for which neurology was consulted.  No prior history of seizures document in the chart.  Workup with MRI brain without contrast with no acute intracranial abnormalities but notable for chronic microhemorrhages.  Routine EEG with no epileptiform discharges and no seizures    ROS  Unable to ascertain due to intubation and obtunded mentation.  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   Sepsis (HCC) 11/27/2020    Past Surgical History:  Procedure Laterality Date   AMPUTATION Right 12/04/2020   Procedure: RIGHT BELOW KNEE AMPUTATION;  Surgeon: [name], MD;  Location: [facility] OR;  Service: Orthopedics;  Laterality: Right;   APPENDECTOMY     APPLICATION OF WOUND VAC Right 12/04/2020   Procedure: APPLICATION OF WOUND VAC;  Surgeon: [name], MD;  Location: [facility] OR;  Service: Orthopedics;  Laterality: Right;    Family History: Family  History  Problem Relation Age of Onset   Diabetes Mother    Diabetes Father     Social History  reports that he has never smoked. He has never used smokeless tobacco. He reports that he does not drink alcohol and does not use drugs.  No Known Allergies  Medications   Current Facility-Administered Medications:    cefTRIAXone  (ROCEPHIN ) 2 g in sodium chloride  0.9 % 100 mL IVPB, 2 g, Intravenous, Q24H, Zelphia Randall PEDLAR, [name], Last Rate: 200 mL/hr at 05/11/23 9076, 2 g at 05/11/23 9076   Chlorhexidine  Gluconate Cloth 2 % PADS 6 each, 6 each, Topical, Daily, [name], MD, 6 each at 05/10/23 1418   dexmedetomidine  (PRECEDEX ) 400 MCG/100ML (4 mcg/mL) infusion, 0-1.2 mcg/kg/hr, Intravenous, Titrated, [name], [name], MD, Last Rate: 12.75 mL/hr at 05/11/23 1700, 0.6 mcg/kg/hr at 05/11/23 1700   dextrose  10 % infusion, , Intravenous, Continuous PRN, [name], MD   famotidine  (PEPCID ) tablet 20 mg, 20 mg, Per Tube, Daily, [name], MD, 20 mg at 05/11/23 0902   feeding supplement (PROSource TF20) liquid 60 mL, 60 mL, Per Tube, Daily, [name], [name], MD, 60 mL at 05/11/23 0902   feeding supplement (VITAL 1.5 CAL) liquid 1,000 mL, 1,000 mL, Per Tube, Continuous, [name], [name], MD, Last Rate: 35 mL/hr at 05/11/23 1700, Infusion Verify at 05/11/23 1700   fentaNYL  (SUBLIMAZE ) bolus via infusion 25-100 mcg, 25-100 mcg, Intravenous, Q15 min PRN, [name], MD, 25 mcg at 05/10/23 0334   fentaNYL  (SUBLIMAZE ) injection 25 mcg, 25 mcg, Intravenous, Q15 min PRN, [name], MD   fentaNYL  (SUBLIMAZE ) injection 25-100 mcg,  25-100 mcg, Intravenous, Q30 min PRN, [name], MD, 50 mcg at 05/11/23 1126   heparin  ADULT infusion 100 units/mL (25000 units/250mL), 1,200 Units/hr, Intravenous, Continuous, [name], [name], Last Rate: 12 mL/hr at 05/11/23 1700, 1,200 Units/hr at 05/11/23 1700   insulin  aspart (novoLOG ) injection 1-3 Units, 1-3 Units, Subcutaneous, Q4H, [name],  [name], MD, 2 Units at 05/11/23 1200   insulin  aspart (novoLOG ) injection 10 Units, 10 Units, Subcutaneous, Q4H, [name], MD, 10 Units at 05/10/23 1609   insulin  glargine-yfgn (SEMGLEE ) injection 29 Units, 29 Units, Subcutaneous, BID, [name], MD   lactated ringers  infusion, , Intravenous, Continuous, [name], [name], DO, Last Rate: 200 mL/hr at 05/11/23 1700, Infusion Verify at 05/11/23 1700   multivitamin with minerals tablet 1 tablet, 1 tablet, Per Tube, Daily, [name], MD, 1 tablet at 05/11/23 9097   Oral care mouth rinse, 15 mL, Mouth Rinse, Q2H, [name], MD, 15 mL at 05/11/23 1738   Oral care mouth rinse, 15 mL, Mouth Rinse, PRN, [name], MD   polyethylene glycol (MIRALAX  / GLYCOLAX ) packet 17 g, 17 g, Per Tube, Daily PRN, [name], MD   [COMPLETED] remdesivir  200 mg in sodium chloride  0.9% 250 mL IVPB, 200 mg, Intravenous, Once, Stopped at 05/10/23 1134 **FOLLOWED BY** remdesivir  100 mg in sodium chloride  0.9 % 100 mL IVPB, 100 mg, Intravenous, Daily, [name], MD, Last Rate: 200 mL/hr at 05/11/23 1003, 100 mg at 05/11/23 1003   thiamine  (VITAMIN B1) tablet 100 mg, 100 mg, Per Tube, Daily, [name], MD, 100 mg at 05/11/23 0902  Vitals   Vitals:   05/11/23 1600 05/11/23 1615 05/11/23 1630 05/11/23 1700  BP: 136/85   139/85  Pulse:    76  Resp: 14 14 15 14   Temp: (!) 97 F (36.1 C) (!) 97 F (36.1 C) (!) 97 F (36.1 C) (!) 97 F (36.1 C)  TempSrc:      SpO2:    100%  Weight:      Height:        Body mass index is 28.01 kg/m.  Physical Exam   General: Laying comfortably in bed; in no acute distress. Intubated. HENT: Normal oropharynx and mucosa. Normal external appearance of ears and nose.  Neck: Supple, no pain or tenderness  CV: No JVD. No peripheral edema.  Pulmonary: Symmetric Chest rise. Normal respiratory effort.  Abdomen: Soft to touch, non-tender.  Ext: No cyanosis, edema, or deformity  Skin: No  rash. Normal palpation of skin.   Musculoskeletal: Normal digits and nails by inspection. No clubbing.   Neurologic Examination  Mental status/Cognition: Obtunded, intubated, does not answer any orientation questions or follow any commands.  To loud voice/clap, he has may be partial eye-opening for a few seconds only.  To tactile stimulation, he has partial eye-opening again for a few seconds only.  speech/language: Mute, no speech, does not attempt to follow any commands. Cranial nerves:   CN II Right pupil is about 5 mm and unreactive to light.  Upon chart review, this was also noted back in August 2024 in the emergency department and was attributed to right cataract surgery.  Left pupil is about 3 mm and briskly reactive to light.   CN III,IV,VI EOMI intact to doll's eye maneuver, no gaze preference or deviation, no nystagmus.   CN V Corneals intact bilaterally   CN VII Symmetric facial grimace to nares stimuli.   CN VIII Does open eyes partially to loud voice/clap.  CN IX & X Cough intact.  Gag is intact.   CN XI To nares stimulation, he does move his head left to right.   CN XII Tongue pushed to the bottom of the mouth by ETT.   Sensory/Motor:  Muscle bulk: poor, tone flaccid Localizes to proximal pinch in bilateral upper extremities. Withdraws left lower extremity to pinch.  Has had right BKA.  Coordination/Complex Motor:  Unable to asses.   Labs/Imaging/Neurodiagnostic studies   CBC:  Recent Labs  Lab May 15, 2023 1034 05-15-2023 1049 05/10/23 0348 05/10/23 1828 05/11/23 0332  WBC 10.3  --  10.7*  --  8.4  NEUTROABS 8.5*  --   --   --   --   HGB 11.7*   < > 9.8* 8.8* 8.9*  HCT 35.2*   < > 29.6* 26.0* 26.9*  MCV 73.0*  --  72.4*  --  72.3*  PLT 177  --  167  --  118*   < > = values in this interval not displayed.   Basic Metabolic Panel:  Lab Results  Component Value Date   NA 144 05/11/2023   K 3.9 05/11/2023   CO2 23 05/11/2023   GLUCOSE 108 (H) 05/11/2023   BUN  70 (H) 05/11/2023   CREATININE 5.22 (H) 05/11/2023   CALCIUM  7.8 (L) 05/11/2023   GFRNONAA 11 (L) 05/11/2023   Lipid Panel:  Lab Results  Component Value Date   LDLCALC 69 02/18/2023   HgbA1c:  Lab Results  Component Value Date   HGBA1C 14.0 (H) 12/27/2022   Urine Drug Screen:     Component Value Date/Time   LABOPIA NONE DETECTED 02/16/2023 1444   COCAINSCRNUR NONE DETECTED 02/16/2023 1444   LABBENZ NONE DETECTED 02/16/2023 1444   AMPHETMU NONE DETECTED 02/16/2023 1444   THCU NONE DETECTED 02/16/2023 1444   LABBARB NONE DETECTED 02/16/2023 1444    Alcohol Level     Component Value Date/Time   ETH <10 02/16/2023 2028   INR  Lab Results  Component Value Date   INR 1.3 (H) 05/11/2023   APTT  Lab Results  Component Value Date   APTT 29 02/16/2023   AED levels: No results found for: PHENYTOIN, ZONISAMIDE, LAMOTRIGINE, LEVETIRACETA  CT Head without contrast(Personally reviewed): Atrophy, chronic microvascular disease. No acute intracranial abnormality.  MRI Brain(Personally reviewed): 1.  No evidence of an acute intracranial abnormality. 2. 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 3. Chronic infarcts also present within the anterior right frontal lobe white matter and left cerebellar hemisphere. The tiny chronic left cerebellar infarct was not definitively present on the prior brain MRI 02/16/2023. 4. Background mild chronic small vessel ischemic changes within the cerebral white matter and thalami, similar to the prior brain MRI of 02/16/2023. 5. 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 6. Mild paranasal sinus mucosal thickening.  Neurodiagnostics rEEG:  This study is suggestive of cortical dysfunction arising from bifrontal region likely secondary to underlying structural  abnormality. Additionally there is severe diffuse encephalopathy. No seizures or epileptiform discharges were seen throughout the recording.   ASSESSMENT   Jamareon Shimel is a 78 y.o. 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  He was intubated and transferred to the ICU for further evaluation and management.  Further workup  demonstrated rhabdomyolysis, COVID positive, acute renal failure and circulatory shock on pressors.  He was initially started on empiric meningitis coverage but narrowed down to ceftriaxone .  His sedation has been weaned and is now on shows Precedex  0.6 for agitation.  He is slightly improved and waking up a little bit but has persistent encephalopathy for which neurology was consulted.  Workup so far with MRI brain, routine EEG both nonrevealing.  Ammonia is normal, Co. oximetry panel with slightly elevated carboxyhemoglobin.   I suspect that the likely etiology of his encephalopathy is decreased clearance of sedative medications in the setting of significant AKI, prolonged encephalopathy due to significant hyperglycemia and COVID encephalopathy. However, will evaluate for alternate etiologies including getting encephalopathy labs to evaluate for vitamin deficiencies, LTM to evaluate for intermittent subclinical seizures specially since he presented with seizures.  RECOMMENDATIONS  - Will put him up on LTM to evaluate for intermittent seizures, specially since he presented with seizures. - B12, folate, TSH, RPR, HIV. He is already on thiamine  replacement and levels at this point would not be helpful. ______________________________________________________________________  Signed, Ellouise Mari, MD [facility]

## 2023-05-11 NOTE — Progress Notes (Addendum)
 PHARMACY - ANTICOAGULATION CONSULT NOTE  Pharmacy Consult for heparin  Indication: atrial fibrillation  No Known Allergies  Patient Measurements: Height: 5' 11 (180.3 cm) Weight: 91.1 kg (200 lb 13.4 oz) IBW/kg (Calculated) : 75.3 Heparin  Dosing Weight: 88.5 kg  Vital Signs: Temp: 98.1 F (36.7 C) (01/02 1315) Temp Source: Bladder (01/02 1000) BP: 115/69 (01/02 1200) Pulse Rate: 69 (01/02 1315)  Labs: Recent Labs    05/09/23 1034 05/09/23 1049 05/10/23 0348 05/10/23 1145 05/10/23 1828 05/11/23 0332 05/11/23 0927  HGB 11.7*   < > 9.8*  --  8.8* 8.9*  --   HCT 35.2*   < > 29.6*  --  26.0* 26.9*  --   PLT 177  --  167  --   --  118*  --   LABPROT 15.7*  --   --   --   --  16.3*  --   INR 1.2  --   --   --   --  1.3*  --   CREATININE 3.84*   < > 4.47* 4.87*  --  5.31*  --   CKTOTAL 11,470*  --   --  8,829*  --  7,048* 5,327*  CKMB  --   --   --  21.8*  --  12.4*  --    < > = values in this interval not displayed.    Estimated Creatinine Clearance: 13.7 mL/min (A) (by C-G formula based on SCr of 5.31 mg/dL (H)).   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   Taking   potassium chloride  (KLOR-CON ) 10 MEQ tablet Take 20 mEq by mouth daily.   Taking   acetaminophen  (TYLENOL ) 325 MG tablet Take 2 tablets (650 mg total) by mouth every 6 (six) hours as needed for mild pain, headache or fever (or Fever >/= 101).      apixaban  (ELIQUIS ) 5 MG TABS tablet Take 1 tablet (5 mg total) by mouth 2 (two) times daily. 60 tablet 3    atorvastatin  (LIPITOR) 20 MG tablet Take 20 mg by mouth at bedtime.      Blood Glucose  Monitoring Suppl (BLOOD GLUCOSE MONITOR SYSTEM) w/Device KIT 1 (one) Kit as directed 1 kit 0    carvedilol  (COREG ) 25 MG tablet Take 25 mg by mouth 2 (two) times daily with a meal.      insulin  lispro (HUMALOG  KWIKPEN) 100 UNIT/ML KwikPen Inject 2-10 Units into the skin daily as needed (BS > 200 per).      Insulin  Pen Needle (B-D UF III MINI PEN NEEDLES) 31G X 5 MM MISC 1 (one) Each daily 100 each 3    LANTUS  SOLOSTAR 100 UNIT/ML Solostar Pen Inject 26 Units into the skin daily.      metoprolol  tartrate (LOPRESSOR ) 50 MG tablet Take 1 tablet (50 mg total) by mouth 2 (two) times daily. 60 tablet 1    neomycin  (MYCIFRADIN ) 500 MG tablet Take 1,000 mg by mouth in the morning and at bedtime.      potassium chloride  (KLOR-CON  M) 10 MEQ tablet Take 2 tablets (20 mEq total) by mouth daily. 60 tablet 0    sertraline  (  ZOLOFT ) 50 MG tablet Take 50 mg by mouth daily.      tamsulosin  (FLOMAX ) 0.4 MG CAPS capsule Take 1 capsule (0.4 mg total) by mouth daily.      torsemide  (DEMADEX ) 20 MG tablet Take 20 mg by mouth daily.      Vitamin D , Ergocalciferol , (DRISDOL ) 1.25 MG (50000 UNIT) CAPS capsule Take 50,000 Units by mouth every Sunday.       Assessment: 77 YOM found unresponsive and seizing for unknown time, at least 2 days. Patient on Eliquis  PTA, anticoagulation held on admission. Pharmacy consulted for heparin  dosing.   Unknown when patient last took Eliquis  PTA. Will monitor both aPTT and heparin  levels until correlating. Patient on heparin  subcutaneous 5000 units every 8 hours this admission, last dose 1/2 at 1328. Today, 05/11/23, Hgb 8.9 and PLT 118.   Goal of Therapy:  Heparin  level 0.3-0.7 units/ml Monitor platelets by anticoagulation protocol: Yes   Plan:  Start heparin  1200 units/hr - no bolus due to recent dose of subcutaneous heparin  Check aPTT and heparin  level in 8 hours Monitor aPTT and heparin  level daily until correlating Monitor CBC and s/sx of bleeding daily  Morna Breach,  PharmD PGY-1 Acute Care Pharmacy Resident 05/11/2023 1:37 PM

## 2023-05-11 NOTE — TOC CM/SW Note (Signed)
 Transition of Care [facility]) - Inpatient Brief Assessment   Patient Details  Name: [name] MRN: [id number] Date of Birth: [birth year]  Transition of [facility]) CM/SW Contact:    Roxie KANDICE Stain, RN Phone Number: 05/11/2023, 12:38 PM   Clinical Narrative:  Patient found unresponsive, with active seizures.  Intubated and on precedex  and  levophed .   Transition of Care Asessment:     NCM unable to assess patient due to intubation at this time. Patient not medically stable for discharge.  NCM will continue to follow as patient progresses with care towards discharge.

## 2023-05-11 NOTE — Progress Notes (Signed)
 LTM EEG running - no initial skin breakdown - push button tested - neuro notified. [facility] charge captured

## 2023-05-11 NOTE — Progress Notes (Addendum)
 Afternoon rounds completed at bedside.  He is off of levophed . Remains on small dose of precedex  due to agitation.  PM BMP with Creatinine of 5.31 to 5.2, BUN at 70. Anion gap metabolic acidosis resolved.  [name] M. [name], D.O.  Internal Medicine Resident, PGY-3 [facility]  Pager: [phone] 4:00 PM, 05/11/2023   D/w resident at bedisde. Neuro plans Long tem EEG but renal function and rhabdo are improving    SIGNATURE    Dr. Dorethia Cave, M.D., F.C.C.P,  Pulmonary and Critical Care Medicine Staff Physician, [facility] Director - Interstitial Lung Disease  Program  Pulmonary Fibrosis [facility] Network at [facility] [place], [place], [address]   Pager: [phone], If no answer  -> Check AMION or Try [phone] Telephone (clinical office): [phone] Telephone (research): [phone]  5:53 PM 05/11/2023

## 2023-05-11 NOTE — Plan of Care (Signed)
  Problem: Education: Goal: Knowledge of risk factors and measures for prevention of condition will improve Outcome: Progressing   Problem: Respiratory: Goal: Will maintain a patent airway Outcome: Progressing   Problem: Health Behavior/Discharge Planning: Goal: Ability to manage health-related needs will improve Outcome: Progressing   

## 2023-05-12 DIAGNOSIS — R569 Unspecified convulsions: Secondary | ICD-10-CM

## 2023-05-12 DIAGNOSIS — R4182 Altered mental status, unspecified: Secondary | ICD-10-CM | POA: Diagnosis not present

## 2023-05-12 DIAGNOSIS — E1110 Type 2 diabetes mellitus with ketoacidosis without coma: Secondary | ICD-10-CM | POA: Diagnosis not present

## 2023-05-12 LAB — BASIC METABOLIC PANEL
Anion gap: 13 (ref 5–15)
BUN: 67 mg/dL — ABNORMAL HIGH (ref 8–23)
CO2: 24 mmol/L (ref 22–32)
Calcium: 8.2 mg/dL — ABNORMAL LOW (ref 8.9–10.3)
Chloride: 108 mmol/L (ref 98–111)
Creatinine, Ser: 4.9 mg/dL — ABNORMAL HIGH (ref 0.61–1.24)
GFR, Estimated: 12 mL/min — ABNORMAL LOW (ref >60)
Glucose, Bld: 102 mg/dL — ABNORMAL HIGH (ref 70–99)
Potassium: 3.5 mmol/L (ref 3.5–5.1)
Sodium: 145 mmol/L (ref 135–145)

## 2023-05-12 LAB — APTT
aPTT: 140 s — ABNORMAL HIGH (ref 24–36)
aPTT: 115 s — ABNORMAL HIGH (ref 24–36)
aPTT: 100 s — ABNORMAL HIGH (ref 24–36)

## 2023-05-12 LAB — HEPATIC FUNCTION PANEL
ALT: 49 U/L — ABNORMAL HIGH (ref 0–44)
AST: 103 U/L — ABNORMAL HIGH (ref 15–41)
Albumin: 1.8 g/dL — ABNORMAL LOW (ref 3.5–5.0)
Alkaline Phosphatase: 49 U/L (ref 38–126)
Bilirubin, Direct: <0.1 mg/dL (ref 0.0–0.2)
Total Bilirubin: 0.5 mg/dL (ref 0.0–1.2)
Total Protein: 5.4 g/dL — ABNORMAL LOW (ref 6.5–8.1)

## 2023-05-12 LAB — CBC
HCT: 24.9 % — ABNORMAL LOW (ref 39.0–52.0)
Hemoglobin: 8.1 g/dL — ABNORMAL LOW (ref 13.0–17.0)
MCH: 24.3 pg — ABNORMAL LOW (ref 26.0–34.0)
MCHC: 32.5 g/dL (ref 30.0–36.0)
MCV: 74.6 fL — ABNORMAL LOW (ref 80.0–100.0)
Platelets: 115 10*3/uL — ABNORMAL LOW (ref 150–400)
RBC: 3.34 MIL/uL — ABNORMAL LOW (ref 4.22–5.81)
RDW: 15.7 % — ABNORMAL HIGH (ref 11.5–15.5)
WBC: 7.2 10*3/uL (ref 4.0–10.5)
nRBC: 0 % (ref 0.0–0.2)

## 2023-05-12 LAB — GLUCOSE, CAPILLARY
Glucose-Capillary: 117 mg/dL — ABNORMAL HIGH (ref 70–99)
Glucose-Capillary: 119 mg/dL — ABNORMAL HIGH (ref 70–99)
Glucose-Capillary: 124 mg/dL — ABNORMAL HIGH (ref 70–99)
Glucose-Capillary: 129 mg/dL — ABNORMAL HIGH (ref 70–99)
Glucose-Capillary: 146 mg/dL — ABNORMAL HIGH (ref 70–99)
Glucose-Capillary: 90 mg/dL (ref 70–99)
Glucose-Capillary: 96 mg/dL (ref 70–99)

## 2023-05-12 LAB — URINE DRUGS OF ABUSE SCREEN W ALC, ROUTINE (REF LAB)
Amphetamines, Urine: NEGATIVE ng/mL
Barbiturate, Ur: NEGATIVE ng/mL
Cannabinoid Quant, Ur: NEGATIVE ng/mL
Cocaine (Metab.): NEGATIVE ng/mL
Ethanol U, Quan: NEGATIVE %
Methadone Screen, Urine: NEGATIVE ng/mL
Opiate Quant, Ur: NEGATIVE ng/mL
Phencyclidine, Ur: NEGATIVE ng/mL
Propoxyphene, Urine: NEGATIVE ng/mL

## 2023-05-12 LAB — HEPARIN LEVEL (UNFRACTIONATED)
Heparin Unfractionated: 0.7 [IU]/mL (ref 0.30–0.70)
Heparin Unfractionated: 0.96 [IU]/mL — ABNORMAL HIGH (ref 0.30–0.70)
Heparin Unfractionated: 0.97 [IU]/mL — ABNORMAL HIGH (ref 0.30–0.70)

## 2023-05-12 LAB — PHOSPHORUS: Phosphorus: 4.5 mg/dL (ref 2.5–4.6)

## 2023-05-12 LAB — CK: Total CK: 3745 U/L — ABNORMAL HIGH (ref 49–397)

## 2023-05-12 LAB — DRUG PROFILE 799031: BENZODIAZEPINES: NEGATIVE

## 2023-05-12 LAB — RPR: RPR Ser Ql: NONREACTIVE

## 2023-05-12 LAB — MAGNESIUM: Magnesium: 2.5 mg/dL — ABNORMAL HIGH (ref 1.7–2.4)

## 2023-05-12 MED ORDER — DEXAMETHASONE 6 MG PO TABS
6.0000 mg | ORAL_TABLET | Freq: Every day | ORAL | Status: DC
  Filled 2023-05-12: qty 1

## 2023-05-12 MED ORDER — DOCUSATE SODIUM 100 MG PO CAPS: 100.0000 mg | ORAL_CAPSULE | Freq: Two times a day (BID) | ORAL | Status: DC

## 2023-05-12 MED ORDER — DOCUSATE SODIUM 50 MG/5ML PO LIQD
100.0000 mg | Freq: Two times a day (BID) | ORAL | Status: DC
  Administered 2023-05-12 – 2023-05-13 (×3): 100 mg
  Filled 2023-05-12 (×3): qty 10

## 2023-05-12 MED ORDER — POLYETHYLENE GLYCOL 3350 17 G PO PACK
17.0000 g | PACK | Freq: Every day | ORAL | Status: DC
  Administered 2023-05-12 – 2023-05-13 (×2): 17 g
  Filled 2023-05-12 (×2): qty 1

## 2023-05-12 MED ORDER — SENNA 8.6 MG PO TABS
2.0000 | ORAL_TABLET | Freq: Every day | ORAL | Status: DC
  Administered 2023-05-12: 17.2 mg
  Filled 2023-05-12: qty 2

## 2023-05-12 NOTE — Procedures (Signed)
 Patient Name: Cory [name]  MRN: [id number]  Epilepsy Attending: Arlin MALVA Krebs  Referring Physician/Provider: Khaliqdina, Salman, MD   Duration: 05/10/2022 1924 to 05/11/2022 0741  Patient history: 78 y.o. 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 EEG to evaluate for seizure  Level of alertness: comatose  AEDs during EEG study: None  Technical aspects: This EEG study was done with scalp electrodes positioned according to the 10-20 International system of electrode placement. Electrical activity was reviewed with band pass filter of 1-70Hz , sensitivity of 7 uV/mm, display speed of 69mm/sec with a 60Hz  notched filter applied as appropriate. EEG data were recorded continuously and digitally stored.  Video monitoring was available and reviewed as appropriate.  Description: EEG showed near continuous generalized low amplitude rhythmic 2-3Hz  delta slowing with overriding 13-15hz  beta activity admixed with 1 to 2 seconds of generalized EEG attenuation. Hyperventilation and photic stimulation were not performed.     ABNORMALITY - Continuous slow, generalized  IMPRESSION: This study is suggestive of severe to profound diffuse encephalopathy. No seizures or epileptiform discharges were seen throughout the recording.  Litzi Binning O Rye Decoste

## 2023-05-12 NOTE — Plan of Care (Signed)
  Problem: Respiratory: Goal: Will maintain a patent airway Outcome: Progressing Goal: Complications related to the disease process, condition or treatment will be avoided or minimized Outcome: Progressing   Problem: Education: Goal: Knowledge of General Education information will improve Description: Including pain rating scale, medication(s)/side effects and non-pharmacologic comfort measures Outcome: Progressing   Problem: Clinical Measurements: Goal: Ability to maintain clinical measurements within normal limits will improve Outcome: Progressing Goal: Will remain free from infection Outcome: Progressing Goal: Diagnostic test results will improve Outcome: Progressing Goal: Respiratory complications will improve Outcome: Progressing Goal: Cardiovascular complication will be avoided Outcome: Progressing

## 2023-05-12 NOTE — Progress Notes (Addendum)
 PHARMACY - ANTICOAGULATION CONSULT NOTE  Pharmacy Consult for heparin  Indication: atrial fibrillation  No Known Allergies  Patient Measurements: Height: 5' 11 (180.3 cm) Weight: 91.1 kg (200 lb 13.4 oz) IBW/kg (Calculated) : 75.3 Heparin  Dosing Weight: 88.5 kg  Vital Signs: Temp: 99.9 F (37.7 C) (01/03 1205) Temp Source: Bladder (01/03 1200) BP: 142/82 (01/03 1200) Pulse Rate: 75 (01/03 1205)  Labs: Recent Labs    05/10/23 0348 05/10/23 0348 05/10/23 1145 05/10/23 1828 05/11/23 0332 05/11/23 0927 05/11/23 1501 05/12/23 0009 05/12/23 0243 05/12/23 0258 05/12/23 0506 05/12/23 1132  HGB 9.8*  --   --  8.8* 8.9*  --   --   --   --   --  8.1*  --   HCT 29.6*  --   --  26.0* 26.9*  --   --   --   --   --  24.9*  --   PLT 167  --   --   --  118*  --   --   --   --   --  115*  --   APTT  --   --   --   --   --   --   --  140*  --  115*  --  100*  LABPROT  --   --   --   --  16.3*  --   --   --   --   --   --   --   INR  --   --   --   --  1.3*  --   --   --   --   --   --   --   HEPARINUNFRC  --   --   --   --   --   --   --  0.96* 0.97*  --   --  0.70  CREATININE 4.47*  --  4.87*  --  5.31*  --  5.22*  --   --   --  4.90*  --   CKTOTAL  --    < > 8,829*  --  7,048* 5,327*  --   --   --   --  3,745*  --   CKMB  --   --  21.8*  --  12.4*  --   --   --   --   --   --   --    < > = values in this interval not displayed.    Estimated Creatinine Clearance: 14.8 mL/min (A) (by C-G formula based on SCr of 4.9 mg/dL (H)).   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 Patient on Eliquis  PTA, anticoagulation held on admission. Pharmacy consulted for heparin  dosing.   Unknown when patient last took Eliquis  PTA. Will  monitor both aPTT and heparin  levels until correlating. Patient on heparin  subcutaneous 5000 units every 8 hours this admission, last dose 1/2 at 1328. Today, 05/11/23, Hgb 8.9 and PLT 118.   1/3 PM: aPTT 100 and Heparin  level 0.70, both borderline supratherapeutic. Levels correlating, will utilize heparin  levels moving forward.  No issues with heparin  infusion running or signs of bleeding per RN. Hgb 8.1 and PLT 115, both decreasing.  Goal of Therapy:  Heparin  level 0.3-0.7 units/ml aPTT 66-102 seconds Monitor platelets by anticoagulation protocol: Yes   Plan:  Decrease heparin  to 900 units/hr  Check heparin  level in 8 hours Monitor heparin  level, CBC, and s/sx of bleeding daily  Morna Breach, PharmD PGY-1 Acute Care Pharmacy Resident 05/12/2023 3:41 PM

## 2023-05-12 NOTE — Progress Notes (Signed)
 LTM EEG discontinued - no skin breakdown at [facility].

## 2023-05-12 NOTE — Progress Notes (Signed)
 PHARMACY - ANTICOAGULATION CONSULT NOTE  Pharmacy Consult for heparin  Indication: atrial fibrillation  No Known Allergies  Patient Measurements: Height: 5' 11 (180.3 cm) Weight: 91.1 kg (200 lb 13.4 oz) IBW/kg (Calculated) : 75.3 Heparin  Dosing Weight: 88.5 kg  Vital Signs: Temp: 99.7 F (37.6 C) (01/03 0327) Temp Source: Bladder (01/02 2000) BP: 129/67 (01/03 0300) Pulse Rate: 72 (01/03 0327)  Labs: Recent Labs    05/09/23 1034 05/09/23 1049 05/10/23 0348 05/10/23 1145 05/10/23 1828 05/11/23 0332 05/11/23 0927 05/11/23 1501 05/12/23 0009 05/12/23 0243 05/12/23 0258  HGB 11.7*   < > 9.8*  --  8.8* 8.9*  --   --   --   --   --   HCT 35.2*   < > 29.6*  --  26.0* 26.9*  --   --   --   --   --   PLT 177  --  167  --   --  118*  --   --   --   --   --   APTT  --   --   --   --   --   --   --   --  140*  --  115*  LABPROT 15.7*  --   --   --   --  16.3*  --   --   --   --   --   INR 1.2  --   --   --   --  1.3*  --   --   --   --   --   HEPARINUNFRC  --   --   --   --   --   --   --   --  0.96* 0.97*  --   CREATININE 3.84*   < > 4.47* 4.87*  --  5.31*  --  5.22*  --   --   --   CKTOTAL 11,470*  --   --  1,170*  --  7,048* 5,327*  --   --   --   --   CKMB  --   --   --  21.8*  --  12.4*  --   --   --   --   --    < > = values in this interval not displayed.    Estimated Creatinine Clearance: 13.9 mL/min (A) (by C-G formula based on SCr of 5.22 mg/dL (H)).   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 Patient on Eliquis  PTA, anticoagulation held on admission. Pharmacy consulted for heparin  dosing.   Unknown when patient last took Eliquis  PTA. Will monitor both aPTT and heparin  levels until correlating. Patient  on heparin  subcutaneous 5000 units every 8 hours this admission, last dose 1/2 at 1328. Today, 05/11/23, Hgb 8.9 and PLT 118.   1/3 AM: heparin  level 0.97 and aPTT 115 seconds on re-draw (supratherapeutic) on 1200 units/hr (levels correlated). Per RN, no issues with the heparin  infusion running continuously or signs/symptoms of bleeding. Level was drawn in opposite extremity that heparin  is currently running on.  Goal of Therapy:  Heparin  level 0.3-0.7 units/ml Monitor platelets by anticoagulation protocol: Yes   Plan:  Decrease heparin  to 1000 units/hr  Check aPTT  and heparin  level in 8 hours (looks like will be correlated, and can transition to heparin  levels) Monitor aPTT and heparin  level daily until correlating Monitor CBC and s/sx of bleeding daily  Lynwood Poplar, PharmD. Clinical Pharmacist 05/12/2023 4:02 AM

## 2023-05-12 NOTE — Progress Notes (Signed)
 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 Per ED documentation, family had not heard from patient for the past two days. EMS was called out to patient's home by family and patient was found unresponsive and actively seizing at the side of the patient's bed. Patient's pupils were unequal with right deviation. Upon arrival to ED, patient was obtunded, CBG reading high, and on a 100% NRB. Patient was intubated by ED physician and PCCM consulted for further management.   Pertinent  Medical History  HTN Diabetes Type 2 Afib on eliquis   Rt BKA  Osteomyelitis  HLD   Significant Hospital Events: Including procedures, antibiotic start and stop dates in addition to other pertinent events   12/31 Found unresponsive, with active seizures per EMS. Intubated upon arrival to ED  > Intubated, minimal sedation -COVID-positive Arterial line, Right IJ central line, Foley catheter.  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  Several chronic microhemorrhages and supratentorial brain 01/01 EEG with severe diffuse encephalopathy 01/02 renal function worsening, neuro consult with mentation not improving 01/03 mentation improved, following commands, overnight EEG without seizures  Interim history/ Subjective: One episode of bradycardia overnight. Remains intubated and sedated on precedex .  Opens eyes to voice, follows commands. On precedex .  Objective   Blood pressure 134/77, pulse 72, temperature 99.9 F (37.7 C), resp. rate 16, height 5' 11 (1.803 m), weight 91.1 kg, SpO2 100%. CVP:  [7  mmHg-17 mmHg] 11 mmHg  Vent Mode: PRVC FiO2 (%):  [40 %] 40 % Set Rate:  [14 bmp] 14 bmp Vt Set:  [600 mL] 600 mL PEEP:  [5 cmH20] 5 cmH20 Plateau Pressure:  [17 cmH20-18 cmH20] 17 cmH20   Intake/Output Summary (Last 24 hours) at 05/12/2023 0816 Last data filed at 05/12/2023 0400 Gross per 24 hour  Intake 5916.51 ml  Output 2080 ml  Net 3836.51 ml   Filed Weights   05/09/23 1038 05/10/23 0500 05/11/23 0406  Weight: 88.5 kg 85 kg 91.1 kg   Net + 14L 2L urine output 01/02  Examination: General:  In bed on vent HENT: NCAT ETT in place PULM: CTAB, vent supported breathing CV: RRR, no mgr GI: BS+, soft, nontender MSK: right BKA, no lower extremity edema Neuro: sedated on vent  K 3.5 Bicarb 24 Creatinine worsening from 5.31 to 4.9 BUN from 60 to 66  Hgb stable 8.1 WBC 8.4->7.2 Platelets 115  Blood cx ngtd CK 7,000-> 3,745  Resolved Hospital Problem list   DKA AGMA  Assessment & Plan:   Acute encephalopathy-resolved Presumed UTI COVID + Mentation much better today. He is awake and following commands. Initial SBT with apnea, but he is doing well since being switched later in morning. Plan to extubate later if still doing well.  -continue ceftriaxone , day 4/5 -continue remdesivir , start steroids -neurology consulted: B12 wnl, folate wnl, TSH wnl, RPR pending, HIV - -urine culture pending  AKI on ckd stage 3b Rhabdomyolysis- resolving Baseline creatinine less than 2.  Creatinine improving from 5.3 to 4.9. urine output improved to 2L yesterday.   -trend BMP -strict I and Os - hold  fluids  Afib w RVR- improved Amiodarone  bolus given 01/01. Rates improved to 80s today.  -heparin  for afib, consider starting DOAC tomorrow if remains off of pressors  Diabetes DKA- resolved Continue feeding supplement  -Levemir  29 units BID -SSI  Microscopic hematuria On ua at admission -needs outpatient follow-up  Best Practice (right click and Reselect all SmartList  Selections daily)   Diet/type: NPO w/ meds via tube DVT prophylaxis LMWH Pressure ulcer(s): N/A GI prophylaxis: H2B Lines: N/A Foley:  Yes, and it is still needed Code Status:  full code Last date of multidisciplinary goals of care discussion [name] updated at bedside

## 2023-05-12 NOTE — Progress Notes (Signed)
 Nutrition Follow-up  DOCUMENTATION CODES:  Not applicable  INTERVENTION:  Continue tube feeding via OGT: Vital 1.5 at 55 ml/h (1320 ml per day) Prosource TF20 60 ml 1x/d Provides 2060 kcal, 109 gm protein, 1008 ml free water daily Thiamine  100mg  x 5 doses first dose given 1/1  NUTRITION DIAGNOSIS:  Inadequate oral intake related to inability to eat as evidenced by NPO status. - remains applicable  GOAL:  Patient will meet greater than or equal to 90% of their needs - not progressing, TF held  MONITOR:  TF tolerance, I & O's, Vent status, Labs, Weight trends  REASON FOR ASSESSMENT:  Consult Enteral/tube feeding initiation and management  ASSESSMENT:  Pt with hx of DM type 2, HTN, HLD, and R BKA presented to ED from home. Family had not heard from him in several days. First responders found pt wedged between bed and wall actively seizing. Intubated on arrival to ED.   12/31 - presented to ED, intubated  Pt resting in bed at the time of assessment. No family present at the time of visit to provide a nutrition hx.   Overall, pt appears well nourished on exam with only mild depletions that could be due to normal aging.   TF infusing at goal. RN reports good tolerance so far. No BM, but bowel regimen in place. Will continue current plan. Currently weaning on vent, but per RN HR increasing.  MV: 8.4 L/min Temp (24hrs), Avg:98.5 F (36.9 C), Min:96.8 F (36 C), Max:99.9 F (37.7 C)  Admit weight: 85 kg  Current weight: 91.1 kg  ~4% weight loss noted in the last 3 months, not severe   Intake/Output Summary (Last 24 hours) at 05/12/2023 1337 Last data filed at 05/12/2023 1137 Gross per 24 hour  Intake 6680.26 ml  Output 2675 ml  Net 4005.26 ml  Net IO Since Admission: 15,555.41 mL [05/12/23 1337]  Drains/Lines: OGT (sideport in stomach per XR) CVC triple lumen, right IJ UOP x 24 hours  Nutritionally Relevant Medications: Scheduled Meds:  docusate  100 mg Per  Tube BID   famotidine   20 mg Per Tube Daily   PROSource TF20  60 mL Per Tube Daily   insulin  aspart  1-3 Units Subcutaneous Q4H   insulin  aspart  10 Units Subcutaneous Q4H   insulin  glargine-yfgn  29 Units Subcutaneous BID   multivitamin with minerals  1 tablet Per Tube Daily   polyethylene glycol  17 g Per Tube Daily   senna  2 tablet Per Tube QHS   thiamine   100 mg Per Tube Daily   Continuous Infusions:  cefTRIAXone  (ROCEPHIN )  IV 2 g (05/11/23 0923)   dextrose      feeding supplement (VITAL 1.5 CAL) 45 mL/hr at 05/12/23 0800   lactated ringers  200 mL/hr at 05/12/23 0800   remdesivir  100 mg in sodium chloride  0.9 % 100 mL IVPB 100 mg (05/11/23 1003)   PRN Meds: dextrose   Labs Reviewed: BUN 67, creatinine 4.9 Mg 2.5 CBG ranges from 90-191 mg/dL over the last 24 hours HgbA1c 14% (12/27/22)  NUTRITION - FOCUSED PHYSICAL EXAM: Flowsheet Row Most Recent Value  Orbital Region No depletion  Upper Arm Region No depletion  Thoracic and Lumbar Region No depletion  Buccal Region No depletion  Temple Region No depletion  Clavicle Bone Region No depletion  Clavicle and Acromion Bone Region No depletion  Scapular Bone Region No depletion  Dorsal Hand Unable to assess  [mittens]  Patellar Region No depletion  Anterior Thigh Region  No depletion  Posterior Calf Region Mild depletion  Edema (RD Assessment) Mild  Hair Reviewed  Eyes Reviewed  Mouth Reviewed  Skin Reviewed  Nails Unable to assess  [mittens]   Diet Order:   Diet Order             Diet NPO time specified  Diet effective now                   EDUCATION NEEDS:  Not appropriate for education at this time  Skin:  Skin Assessment: Reviewed RN Assessment Skin tear: left elbow x 3  - Posterior - (2x1 cm) - Anterior - (2x1 cm) - Distal - (4x1 cm)  Last BM:  PTA  Height:  Ht Readings from Last 1 Encounters:  05/09/23 5' 11 (1.803 m)    Weight:  Wt Readings from Last 1 Encounters:  05/11/23 91.1 kg     Ideal Body Weight:  73.6 kg (adjusted by 5.9% for BKA)  BMI:  Body mass index is 28.01 kg/m.  Estimated Nutritional Needs:  Kcal:  2100-2300 kcal/d Protein:  105-120g/d Fluid:  2.1-2.3L/d    Cory [name], RD, LDN Registered Dietitian II Please reach out via secure chat Weekend on-call pager # available in [facility]

## 2023-05-12 NOTE — Progress Notes (Signed)
 Pharmacy ICU Bowel Regimen Consult Note   Current Inpatient Medications for Bowel Management:  Polyethylene glycol 17 g daily PRN (no doses given this admission)  Assessment: Cory [name] is a 78 y.o. year old male admitted on 05/09/2023. Constipation identified as acute opioid-induced constipation . Bowel regimen assessment completed by Aloysius Gailen Slates, RN on 05/12/23. LBM prior to admission.  [x]  Bowel sounds present  []  No abdominal tenderness  [x]  Passing gas   Plan: Start Docusate 100 mg PO BID + Senna 2 tabs PO at bedtime + polyethylene glycol 17 gm PO every morning  Thank you for allowing pharmacy to participate in this patient's care.  Morna Breach, PharmD PGY-1 Acute Care Pharmacy Resident 05/12/2023 9:01 AM

## 2023-05-13 DIAGNOSIS — E1110 Type 2 diabetes mellitus with ketoacidosis without coma: Secondary | ICD-10-CM | POA: Diagnosis not present

## 2023-05-13 LAB — GLUCOSE, CAPILLARY
Glucose-Capillary: 107 mg/dL — ABNORMAL HIGH (ref 70–99)
Glucose-Capillary: 107 mg/dL — ABNORMAL HIGH (ref 70–99)
Glucose-Capillary: 128 mg/dL — ABNORMAL HIGH (ref 70–99)
Glucose-Capillary: 163 mg/dL — ABNORMAL HIGH (ref 70–99)
Glucose-Capillary: 19 mg/dL — CL (ref 70–99)
Glucose-Capillary: 33 mg/dL — CL (ref 70–99)
Glucose-Capillary: 36 mg/dL — CL (ref 70–99)
Glucose-Capillary: 47 mg/dL — ABNORMAL LOW (ref 70–99)
Glucose-Capillary: 51 mg/dL — ABNORMAL LOW (ref 70–99)
Glucose-Capillary: 72 mg/dL (ref 70–99)
Glucose-Capillary: 75 mg/dL (ref 70–99)
Glucose-Capillary: 76 mg/dL (ref 70–99)

## 2023-05-13 LAB — HEPARIN LEVEL (UNFRACTIONATED): Heparin Unfractionated: 0.43 [IU]/mL (ref 0.30–0.70)

## 2023-05-13 LAB — BASIC METABOLIC PANEL
Anion gap: 11 (ref 5–15)
BUN: 66 mg/dL — ABNORMAL HIGH (ref 8–23)
CO2: 26 mmol/L (ref 22–32)
Calcium: 8.3 mg/dL — ABNORMAL LOW (ref 8.9–10.3)
Chloride: 112 mmol/L — ABNORMAL HIGH (ref 98–111)
Creatinine, Ser: 4.25 mg/dL — ABNORMAL HIGH (ref 0.61–1.24)
GFR, Estimated: 14 mL/min — ABNORMAL LOW (ref >60)
Glucose, Bld: 135 mg/dL — ABNORMAL HIGH (ref 70–99)
Potassium: 3.5 mmol/L (ref 3.5–5.1)
Sodium: 149 mmol/L — ABNORMAL HIGH (ref 135–145)

## 2023-05-13 LAB — CBC
HCT: 24.7 % — ABNORMAL LOW (ref 39.0–52.0)
Hemoglobin: 7.9 g/dL — ABNORMAL LOW (ref 13.0–17.0)
MCH: 24.1 pg — ABNORMAL LOW (ref 26.0–34.0)
MCHC: 32 g/dL (ref 30.0–36.0)
MCV: 75.3 fL — ABNORMAL LOW (ref 80.0–100.0)
Platelets: 140 10*3/uL — ABNORMAL LOW (ref 150–400)
RBC: 3.28 MIL/uL — ABNORMAL LOW (ref 4.22–5.81)
RDW: 15.9 % — ABNORMAL HIGH (ref 11.5–15.5)
WBC: 6.9 10*3/uL (ref 4.0–10.5)
nRBC: 0 % (ref 0.0–0.2)

## 2023-05-13 LAB — URINE CULTURE: Culture: 20000 — AB

## 2023-05-13 LAB — MAGNESIUM: Magnesium: 2.5 mg/dL — ABNORMAL HIGH (ref 1.7–2.4)

## 2023-05-13 LAB — PHOSPHORUS: Phosphorus: 4.6 mg/dL (ref 2.5–4.6)

## 2023-05-13 MED ORDER — DEXTROSE 50 % IV SOLN
INTRAVENOUS | Status: AC
  Filled 2023-05-13: qty 50

## 2023-05-13 MED ORDER — THIAMINE MONONITRATE 100 MG PO TABS
100.0000 mg | ORAL_TABLET | Freq: Every day | ORAL | Status: AC
  Administered 2023-05-14: 100 mg via ORAL
  Filled 2023-05-13: qty 1

## 2023-05-13 MED ORDER — POLYETHYLENE GLYCOL 3350 17 G PO PACK
17.0000 g | PACK | Freq: Every day | ORAL | Status: DC
  Administered 2023-05-14 – 2023-05-21 (×4): 17 g via ORAL
  Filled 2023-05-13 (×5): qty 1

## 2023-05-13 MED ORDER — SENNA 8.6 MG PO TABS
2.0000 | ORAL_TABLET | Freq: Every day | ORAL | Status: DC
  Administered 2023-05-15 – 2023-05-21 (×7): 17.2 mg via ORAL
  Filled 2023-05-13 (×7): qty 2

## 2023-05-13 MED ORDER — APIXABAN 5 MG PO TABS
5.0000 mg | ORAL_TABLET | Freq: Two times a day (BID) | ORAL | Status: DC
  Administered 2023-05-13 – 2023-05-14 (×2): 5 mg via ORAL
  Filled 2023-05-13 (×3): qty 1

## 2023-05-13 MED ORDER — INSULIN ASPART 100 UNIT/ML IJ SOLN: 0.0000 [IU] | INTRAMUSCULAR | Status: DC

## 2023-05-13 MED ORDER — FAMOTIDINE 20 MG PO TABS: 10.0000 mg | ORAL_TABLET | Freq: Every day | ORAL | Status: DC

## 2023-05-13 MED ORDER — DEXTROSE 50 % IV SOLN
INTRAVENOUS | Status: AC
  Administered 2023-05-13: 50 mL
  Filled 2023-05-13: qty 50

## 2023-05-13 MED ORDER — DOCUSATE SODIUM 100 MG PO CAPS
100.0000 mg | ORAL_CAPSULE | Freq: Two times a day (BID) | ORAL | Status: DC
  Administered 2023-05-14 – 2023-05-22 (×13): 100 mg via ORAL
  Filled 2023-05-13 (×14): qty 1

## 2023-05-13 NOTE — Plan of Care (Signed)
  Problem: Education: Goal: Knowledge of risk factors and measures for prevention of condition will improve 05/13/2023 1813 by Onalee Connell LABOR, RN Outcome: Not Progressing 05/13/2023 1813 by Onalee Connell LABOR, RN Outcome: Progressing   Problem: Coping: Goal: Psychosocial and spiritual needs will be supported Outcome: Progressing   Problem: Respiratory: Goal: Will maintain a patent airway Outcome: Progressing Goal: Complications related to the disease process, condition or treatment will be avoided or minimized Outcome: Progressing

## 2023-05-13 NOTE — Procedures (Signed)
 Extubation Procedure Note  Patient Details:   Name: Cory [name] DOB: Feb 13, 1946 MRN: [id number]   Airway Documentation:    Vent end date: 05/13/23 Vent end time: 1041   Evaluation  O2 sats: stable throughout Complications: No apparent complications Patient did tolerate procedure well. Bilateral Breath Sounds: Diminished   Yes Pt extubated w/o complications to 2 lpm. + cuff leak; no stridor noted.   Bari [name] Daunt 05/13/2023, 10:45 AM

## 2023-05-13 NOTE — Progress Notes (Signed)
 eLink Physician-Brief Progress Note Patient Name: Cory [name] DOB: 1945/12/01 MRN: [id number]   Date of Service  05/13/2023  HPI/Events of Note  78 year old man who presented with seizures in the context of hyperglycemia. Found to be COVID-positive, but not clear whether this is incidental given relatively normal chest x-ray.   Vented Pt attempting to reach ETT  eICU Interventions  Add Restraints     Intervention Category Minor Interventions: Agitation / anxiety - evaluation and management  [name] 05/13/2023, 1:14 AM

## 2023-05-13 NOTE — Progress Notes (Signed)
 CRITICAL VALUE STICKER  CRITICAL VALUE: glucose 33  DATE & TIME NOTIFIED: 05/13/23 @ 11:20  MD NOTIFIED: [name], DO   TIME OF NOTIFICATION: 11:20  RESPONSE: Amp of D50 given STAT, rechecked blood sugar 107

## 2023-05-13 NOTE — Progress Notes (Signed)
 Brief Neuro Update:  Extubated, asking appropriate questions about his prosthesis and following commands with no focal deficit per my discussion with Dr. Arlinda. We will signoff. Please feel free to contact us  with any questions or concerns.  Demetria Lightsey [facility]

## 2023-05-13 NOTE — Progress Notes (Signed)
   05/13/23 0115  Restraint Order  Length of Order Daily  Assessment  Less Restrictive Interventions Attempted Yes  Health history reviewed prior to applying restraint Yes  Justification  Clinical Justification Pulling lines;Pulling tubes;Removal of equipment  Plan to Progress Out Reality orientation  Education  Discontinuation Criteria Alternative intervention effective;Responding to safe limit settings;Follows instructions;No longer interfering with care  Discontinuation Criteria Explained Yes  Family Notification Family not available at this time  Restraint Every 2 Hour Monitoring  Airway Clear with Spontaneous Respirations Yes, on ventilator  Circulation / Skin Integrity No signs of injury/restraints applied correctly  Emotional / Mental Status Agitated/restless  Range of Motion Performed  Food and Fluids Tube feeding/TPN  Elimination Urethral catheter  Patient's rights, dignity, safety maintained Yes  Can Restraints be Less Restrictive or Discontinued? No  Non-violent Restraints  Soft Restraint Right Wrist Start  Soft Restraint Left Wrist Start  Safety Interventions  Less Restrictive Interventions Frequent verbal contacts;Medicated;Observation;Pain/Anxiety management;Patient safety mitt;Place patient near nursing station;Provide reassurance;Re-evaluate equipment (appropriately applied);Reorient patient;Repositioning  Diversional Activities Other (comment);Music/relaxation tapes, videos, CDs

## 2023-05-13 NOTE — Progress Notes (Signed)
 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 Per ED documentation, family had not heard from patient for the past two days. EMS was called out to patient's home by family and patient was found unresponsive and actively seizing at the side of the patient's bed. Patient's pupils were unequal with right deviation. Upon arrival to ED, patient was obtunded, CBG reading high, and on a 100% NRB. Patient was intubated by ED physician and PCCM consulted for further management.   Pertinent  Medical History  HTN Diabetes Type 2 Afib on eliquis   Rt BKA  Osteomyelitis  HLD   Significant Hospital Events: Including procedures, antibiotic start and stop dates in addition to other pertinent events   12/31 Found unresponsive, with active seizures per EMS. Intubated upon arrival to ED  > Intubated, minimal sedation -COVID-positive Arterial line, Right IJ central line, Foley catheter.  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  Several chronic microhemorrhages and supratentorial brain 01/01 EEG with severe diffuse encephalopathy 01/02 renal function worsening, neuro consult with mentation not improving 01/03 mentation improved, following commands, overnight EEG without seizures  Interim history/ Subjective: Opens eyes to voice, not following commands. Biting down on tube.  Doing well on SBT.  Objective   Blood pressure 117/65, pulse 67, temperature 99.3 F (37.4 C), resp. rate 18, height 5' 11 (1.803 m), weight 94.1 kg, SpO2 100%. CVP:  [9 mmHg-24 mmHg] 10 mmHg  Vent Mode: PRVC FiO2 (%):   [40 %] 40 % Set Rate:  [14 bmp] 14 bmp Vt Set:  [600 mL] 600 mL PEEP:  [5 cmH20] 5 cmH20 Pressure Support:  [5 cmH20] 5 cmH20 Plateau Pressure:  [9 cmH20-19 cmH20] 16 cmH20   Intake/Output Summary (Last 24 hours) at 05/13/2023 0701 Last data filed at 05/13/2023 0700 Gross per 24 hour  Intake 3461.91 ml  Output 3960 ml  Net -498.09 ml   Filed Weights   05/10/23 0500 05/11/23 0406 05/13/23 0436  Weight: 85 kg 91.1 kg 94.1 kg   Net + 14L 2L urine output 01/02  Examination: General:  In bed on vent, moving extremities spontaneously HENT: NCAT ETT in place PULM: CTAB, vent supported breathing CV: RRR, no mgr GI: BS+, soft, nontender MSK: right BKA, no lower extremity edema Neuro: sedated on vent  Na 149 K 3.5 Bicarb 26 Creatinine worsening from 4.9-> 4.25 BUN from 60 to 66  Hgb stable 7.9 WBC 6.9 Platelets 140  Blood cx ngtd Urine culture with less than 20,000 E. coli CK 7,000-> 3,745  Resolved Hospital Problem list   DKA AGMA Acute encephalopathy Rhabdomyolysis  Assessment & Plan:   Acute hypoxic respiratory failure COVID-positive He is doing well on spontaneous breathing trials this morning.  He was extubated following rounds and is doing well with nasal cannula postextubation.  -Continue steroids, day 2 of 10 -May be stable to go to the floor tomorrow if continues to do well throughout today  Presumed UTI He was being treated for presumed UTI with ceftriaxone  is on day 5 of 5.  Urine culture grew 20,000 ESBL E. coli.  With his  clinical improvement I am not concerned about actual bladder infection at this time.  -Received 5 days of ceftriaxone   AKI on ckd stage 3b Baseline creatinine less than 2.  Peak creatinine at 5.3.  Creatinine improved to 4.25 this AM.  He had 2 L urine output yesterday.  Foley remains in place.  -trend BMP -strict I and Os -Continue Foley, if creatinine continues to improve will consider discontinuing Foley with voiding trial  1/5  Hypernatremia Sodium at 149 this morning.  He was extubated this morning and if passes swallow study will be eating and drinking today.  If sodium increases tomorrow would give fluids, but will hold off at this time.  -Trend BMP  Afib w RVR- improved HFpEF Rates are well-controlled.  He is on heparin  for A-fib.  He was extubated this morning.  Will plan to switch to DOAC if he passes swallow test.  -Restart Eliquis  5 mg twice daily if passes swallow test -Continue heparin  for now -Recent dispense history has both metoprolol  and carvedilol  dispensed in October and November for 90-day supplies.  Will need to clarify with patient what he is on for rate control at home. -Home medication also includes torsemide   Diabetes DKA- resolved Blood sugars this morning low at 39.  He was given amp of dextrose .  He did receive a.m. long-acting insulin .  Evening dose was discontinued.  -SSI  Microscopic hematuria On ua at admission -needs outpatient follow-up  Best Practice (right click and Reselect all SmartList Selections daily)   Diet/type: NPO w/ meds via tube DVT prophylaxis heparin  for A-fib Pressure ulcer(s): N/A GI prophylaxis: H2B Lines: N/A Foley:  Yes, and it is still needed Code Status:  full code Last date of multidisciplinary goals of care discussion: unable to reach niece, [name] by phone 01/04. Last updated at bedside 01/03

## 2023-05-13 NOTE — Progress Notes (Signed)
 POCT obtained from pt finger: 47. Pt asymptomatic for hypoglycemia. Re-checked blood sugar from CVC: 76. Will continue to monitor CBGs from central line.

## 2023-05-13 NOTE — Progress Notes (Signed)
 Pharmacy ICU Bowel Regimen Consult Note   Current Inpatient Medications for Bowel Management:  Polyethylene glycol 17 g daily PRN (no doses given this admission)  Assessment: Cory [name] is a 78 y.o. year old male admitted on 05/09/2023. Constipation identified as acute opioid-induced constipation . Bowel regimen assessment completed by Oneil Dies on 05/12/23 2000. LBM 1/3 following initiation of protocol.  [x]  Bowel sounds present  [x]  No abdominal tenderness  [x]  Passing gas   Plan: Continue Docusate 100 mg PO BID + Senna 2 tabs PO at bedtime + polyethylene glycol 17 gm PO every morning  Thank you for allowing pharmacy to participate in this patient's care.  Randall Call, PharmD PGY2 Critical Care Pharmacy Resident 05/13/2023 7:28 AM

## 2023-05-13 NOTE — Progress Notes (Signed)
 PHARMACY - ANTICOAGULATION CONSULT NOTE  Pharmacy Consult for heparin  Indication: atrial fibrillation  No Known Allergies  Patient Measurements: Height: 5' 11 (180.3 cm) Weight: 91.1 kg (200 lb 13.4 oz) IBW/kg (Calculated) : 75.3 Heparin  Dosing Weight: 88.5 kg  Vital Signs: Temp: 99.5 F (37.5 C) (01/04 0100) Temp Source: Bladder (01/04 0100) BP: 125/100 (01/04 0100) Pulse Rate: 71 (01/04 0100)  Labs: Recent Labs    05/10/23 0348 05/10/23 0348 05/10/23 1145 05/10/23 1828 05/11/23 0332 05/11/23 0927 05/11/23 1501 05/12/23 0009 05/12/23 0009 05/12/23 0243 05/12/23 0258 05/12/23 0506 05/12/23 1132 05/13/23 0014  HGB 9.8*  --   --  8.8* 8.9*  --   --   --   --   --   --  8.1*  --   --   HCT 29.6*  --   --  26.0* 26.9*  --   --   --   --   --   --  24.9*  --   --   PLT 167  --   --   --  118*  --   --   --   --   --   --  115*  --   --   APTT  --   --   --   --   --   --   --  140*  --   --  115*  --  100*  --   LABPROT  --   --   --   --  16.3*  --   --   --   --   --   --   --   --   --   INR  --   --   --   --  1.3*  --   --   --   --   --   --   --   --   --   HEPARINUNFRC  --   --   --   --   --   --   --  0.96*   < > 0.97*  --   --  0.70 0.43  CREATININE 4.47*  --  4.87*  --  5.31*  --  5.22*  --   --   --   --  4.90*  --   --   CKTOTAL  --    < > 8,829*  --  7,048* 5,327*  --   --   --   --   --  3,745*  --   --   CKMB  --   --  21.8*  --  12.4*  --   --   --   --   --   --   --   --   --    < > = values in this interval not displayed.    Estimated Creatinine Clearance: 14.8 mL/min (A) (by C-G formula based on SCr of 4.9 mg/dL (H)).  Assessment: 77 YOM found unresponsive and seizing for unknown time, at least 2 days. Patient on Eliquis  PTA, anticoagulation held on admission. Pharmacy consulted for heparin  dosing.   1/4 AM: heparin  level 0.43, therapeutic. No issues with heparin  infusion running or signs of bleeding per RN. Hgb 8.1 and PLT 115, both decreasing  as of 1/3   Goal of Therapy:  Heparin  level 0.3-0.7 units/ml Monitor platelets by anticoagulation protocol: Yes   Plan:  Continue heparin  at 900 units/hr  Monitor heparin  level, CBC, and  s/sx of bleeding daily  Lynwood Poplar, PharmD. Clinical Pharmacist 05/13/2023 1:45 AM

## 2023-05-14 DIAGNOSIS — E1110 Type 2 diabetes mellitus with ketoacidosis without coma: Secondary | ICD-10-CM | POA: Diagnosis not present

## 2023-05-14 LAB — OCCULT BLOOD X 1 CARD TO LAB, STOOL: Fecal Occult Bld: POSITIVE — AB

## 2023-05-14 LAB — BASIC METABOLIC PANEL
Anion gap: 11 (ref 5–15)
BUN: 53 mg/dL — ABNORMAL HIGH (ref 8–23)
CO2: 27 mmol/L (ref 22–32)
Calcium: 8.3 mg/dL — ABNORMAL LOW (ref 8.9–10.3)
Chloride: 113 mmol/L — ABNORMAL HIGH (ref 98–111)
Creatinine, Ser: 3.58 mg/dL — ABNORMAL HIGH (ref 0.61–1.24)
GFR, Estimated: 17 mL/min — ABNORMAL LOW (ref >60)
Glucose, Bld: 85 mg/dL (ref 70–99)
Potassium: 3.7 mmol/L (ref 3.5–5.1)
Sodium: 151 mmol/L — ABNORMAL HIGH (ref 135–145)

## 2023-05-14 LAB — CULTURE, BLOOD (ROUTINE X 2)
Culture: NO GROWTH
Culture: NO GROWTH
Special Requests: ADEQUATE
Special Requests: ADEQUATE

## 2023-05-14 LAB — CBC
HCT: 26.3 % — ABNORMAL LOW (ref 39.0–52.0)
HCT: 26.7 % — ABNORMAL LOW (ref 39.0–52.0)
Hemoglobin: 8.2 g/dL — ABNORMAL LOW (ref 13.0–17.0)
Hemoglobin: 8.3 g/dL — ABNORMAL LOW (ref 13.0–17.0)
MCH: 23.6 pg — ABNORMAL LOW (ref 26.0–34.0)
MCH: 23.7 pg — ABNORMAL LOW (ref 26.0–34.0)
MCHC: 31.2 g/dL (ref 30.0–36.0)
MCHC: 31.1 g/dL (ref 30.0–36.0)
MCV: 75.8 fL — ABNORMAL LOW (ref 80.0–100.0)
MCV: 76.3 fL — ABNORMAL LOW (ref 80.0–100.0)
Platelets: 199 10*3/uL (ref 150–400)
Platelets: 224 10*3/uL (ref 150–400)
RBC: 3.47 MIL/uL — ABNORMAL LOW (ref 4.22–5.81)
RBC: 3.5 MIL/uL — ABNORMAL LOW (ref 4.22–5.81)
RDW: 16.1 % — ABNORMAL HIGH (ref 11.5–15.5)
RDW: 16.2 % — ABNORMAL HIGH (ref 11.5–15.5)
WBC: 7.6 10*3/uL (ref 4.0–10.5)
WBC: 6.1 10*3/uL (ref 4.0–10.5)
nRBC: 0 % (ref 0.0–0.2)
nRBC: 0 % (ref 0.0–0.2)

## 2023-05-14 LAB — GLUCOSE, CAPILLARY
Glucose-Capillary: 79 mg/dL (ref 70–99)
Glucose-Capillary: 124 mg/dL — ABNORMAL HIGH (ref 70–99)
Glucose-Capillary: 226 mg/dL — ABNORMAL HIGH (ref 70–99)
Glucose-Capillary: 249 mg/dL — ABNORMAL HIGH (ref 70–99)
Glucose-Capillary: 309 mg/dL — ABNORMAL HIGH (ref 70–99)

## 2023-05-14 LAB — MAGNESIUM: Magnesium: 2.3 mg/dL (ref 1.7–2.4)

## 2023-05-14 LAB — PHOSPHORUS: Phosphorus: 3.5 mg/dL (ref 2.5–4.6)

## 2023-05-14 MED ORDER — APIXABAN 5 MG PO TABS: 5.0000 mg | ORAL_TABLET | Freq: Two times a day (BID) | ORAL | Status: DC

## 2023-05-14 MED ORDER — CARVEDILOL 12.5 MG PO TABS
12.5000 mg | ORAL_TABLET | Freq: Two times a day (BID) | ORAL | Status: DC
  Administered 2023-05-14 – 2023-05-16 (×5): 12.5 mg via ORAL
  Filled 2023-05-14 (×5): qty 1

## 2023-05-14 MED ORDER — PANTOPRAZOLE SODIUM 40 MG IV SOLR
40.0000 mg | Freq: Two times a day (BID) | INTRAVENOUS | Status: DC
  Administered 2023-05-14 – 2023-05-15 (×2): 40 mg via INTRAVENOUS
  Filled 2023-05-14 (×2): qty 10

## 2023-05-14 MED ORDER — INSULIN ASPART 100 UNIT/ML IJ SOLN
0.0000 [IU] | Freq: Three times a day (TID) | INTRAMUSCULAR | Status: DC
  Administered 2023-05-14: 3 [IU] via SUBCUTANEOUS
  Administered 2023-05-15: 2 [IU] via SUBCUTANEOUS
  Administered 2023-05-15 – 2023-05-16 (×2): 3 [IU] via SUBCUTANEOUS
  Administered 2023-05-16 – 2023-05-17 (×2): 1 [IU] via SUBCUTANEOUS
  Administered 2023-05-17: 2 [IU] via SUBCUTANEOUS
  Administered 2023-05-17 – 2023-05-18 (×2): 3 [IU] via SUBCUTANEOUS
  Administered 2023-05-18 – 2023-05-19 (×2): 2 [IU] via SUBCUTANEOUS
  Administered 2023-05-19: 7 [IU] via SUBCUTANEOUS
  Administered 2023-05-19: 3 [IU] via SUBCUTANEOUS
  Administered 2023-05-20: 7 [IU] via SUBCUTANEOUS
  Administered 2023-05-20: 5 [IU] via SUBCUTANEOUS
  Administered 2023-05-20 – 2023-05-21 (×2): 2 [IU] via SUBCUTANEOUS
  Administered 2023-05-21: 3 [IU] via SUBCUTANEOUS
  Administered 2023-05-21: 5 [IU] via SUBCUTANEOUS
  Administered 2023-05-22: 3 [IU] via SUBCUTANEOUS
  Administered 2023-05-22: 2 [IU] via SUBCUTANEOUS

## 2023-05-14 MED ORDER — SODIUM CHLORIDE 0.45 % IV SOLN: INTRAVENOUS | Status: AC

## 2023-05-14 MED ORDER — ADULT MULTIVITAMIN W/MINERALS CH
1.0000 | ORAL_TABLET | Freq: Every day | ORAL | Status: DC
  Administered 2023-05-16 – 2023-05-22 (×7): 1 via ORAL
  Filled 2023-05-14 (×8): qty 1

## 2023-05-14 MED ORDER — INSULIN ASPART 100 UNIT/ML IJ SOLN
0.0000 [IU] | Freq: Every day | INTRAMUSCULAR | Status: DC
  Administered 2023-05-14: 4 [IU] via SUBCUTANEOUS
  Administered 2023-05-16: 2 [IU] via SUBCUTANEOUS
  Administered 2023-05-17 – 2023-05-21 (×4): 3 [IU] via SUBCUTANEOUS

## 2023-05-14 NOTE — Progress Notes (Addendum)
 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 Per ED documentation, family had not heard from patient for the past two days. EMS was called out to patient's home by family and patient was found unresponsive and actively seizing at the side of the patient's bed. Patient's pupils were unequal with right deviation. Upon arrival to ED, patient was obtunded, CBG reading high, and on a 100% NRB. Patient was intubated by ED physician and PCCM consulted for further management.   Pertinent  Medical History  HTN Diabetes Type 2 Afib on eliquis   Rt BKA  Osteomyelitis  HLD   Significant Hospital Events: Including procedures, antibiotic start and stop dates in addition to other pertinent events   12/31 Found unresponsive, with active seizures per EMS. Intubated upon arrival to ED  > Intubated, minimal sedation -COVID-positive Arterial line, Right IJ central line, Foley catheter.  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  Several chronic microhemorrhages and supratentorial brain 01/01 EEG with severe diffuse encephalopathy 01/02 renal function worsening, neuro consult with mentation not improving 01/03 mentation improved, following commands, overnight EEG without seizures 1/4 extubated  Interim history/ Subjective: Extubated yesterday.  Remains confused at time but is redirectable.   Objective   Blood pressure (!) 141/78, pulse 93, temperature 98.7 F (37.1 C), temperature source Axillary, resp. rate 19, height 5' 11 (1.803 m), weight 94.1 kg, SpO2 93%. CVP:  [10 mmHg] 10 mmHg  Vent  Mode: CPAP;PSV FiO2 (%):  [40 %] 40 % PEEP:  [5 cmH20] 5 cmH20 Pressure Support:  [5 cmH20] 5 cmH20   Intake/Output Summary (Last 24 hours) at 05/14/2023 0731 Last data filed at 05/14/2023 0330 Gross per 24 hour  Intake 1467.06 ml  Output 2985 ml  Net -1517.94 ml   Filed Weights   05/10/23 0500 05/11/23 0406 05/13/23 0436  Weight: 85 kg 91.1 kg 94.1 kg   Net + 14L 2L urine output 01/02  Examination: General: Sitting in bed. HENT: Oral mucosa intact.  No pressure injury. PULM: To auscultation bilaterally CV: Normal heart sounds.  In rate controlled flutter GI: Soft and nontender MSK: right BKA, no lower extremity edema Neuro: Awake no focal deficits.  Confused at times but redirectable.  Moves all limbs spontaneously.  Ancillary tests personally reviewed  Sodium 151 Improving creatinine 3.58  Assessment & Plan:   Principal Problem:   AMS (altered mental status) due to hyperglycemia induced status epilepticus Active Problems: Mechanical ventilation for airway protection now resolved. COVID-19 infection appears to be mild. Hyperglycemia initially with ketosis.  Now resolved Possible UTI. Prior stroke Type 2 diabetes Atrial flutter AKI-improving Hypertension Delirium  Plan:  -Ready for transfer out of ICU.  Delirium should clear faster in lower stimulation environment with more restorative sleep. -Advance diet, progressive ambulation. -Neurology has signed off.  They recommend no further anticonvulsant therapy as they felt that the seizures were hyperglycemia induced. -Restart Eliquis  for atrial fibrillation.  Spontaneously rate controlled. -Increase free water encourage oral intake.  Continue to monitor sodium.  Follow creatinine.  Low-dose IV fluids -Complete 3 days antibiotics for uncomplicated UTI -Insulin  sliding  scale. -Restart home carvedilol  at half dose for hypertension. -Given minimal respiratory symptoms have not treated COVID with steroids.  Outside the  window for remdesivir .   Best Practice (right click and Reselect all SmartList Selections daily)   Diet/type: Advance diet. DVT prophylaxis resume home Eliquis  Pressure ulcer(s): N/A GI prophylaxis: H2B Lines: Remove central line. Foley:  Yes, and it is still needed Code Status:  full code Last date of multidisciplinary goals of care discussion: unable to reach niece, [name] by phone 01/04. Last updated at bedside 01/03  Fredia Alderton, MD [facility] ICU Physician [facility] [place] [facility]  Pager: [phone] Or Epic Secure Chat After hours: [phone].  05/14/2023, 7:52 AM

## 2023-05-14 NOTE — Evaluation (Signed)
 Physical Therapy Evaluation Patient Details Name: Cory [name] MRN: [id number] DOB: 1945-12-03 Today's Date: 05/14/2023  History of Present Illness  Pt is 78 year old presented [facility] on  05/09/23 after found unresponsive and actively seizing at home. Intubated in ED. Pt found to be covid +. 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 Extubated 1/4. PMH - DM, HLD, HTN, Osteomyelitis, R BKA.  Clinical Impression  Pt admitted with above diagnosis and presents to PT with functional limitations due to deficits listed below (See PT problem list). Pt needs skilled PT to maximize independence and safety. Pt with significant decrease in function from baseline. Much of this is due to his current poor cognition. Pt lives alone and is typically independent using his prosthesis. Currently feel patient will benefit from continued inpatient follow up therapy, <3 hours/day. If pt's cognition improves significantly may need to update dc recommendation.            If plan is discharge home, recommend the following: A lot of help with walking and/or transfers;A lot of help with bathing/dressing/bathroom;Direct supervision/assist for medications management;Direct supervision/assist for financial management;Assist for transportation   Can travel by private vehicle   No    Equipment Recommendations None recommended by PT  Recommendations for Other Services       Functional Status Assessment Patient has had a recent decline in their functional status and demonstrates the ability to make significant improvements in function in a reasonable and predictable amount of time.     Precautions / Restrictions        Mobility  Bed Mobility Overal bed mobility: Needs Assistance Bed Mobility: Supine to Sit, Sit to Supine     Supine to sit: +2 for physical assistance, Total assist, HOB elevated Sit to supine: +2 for physical assistance, Max assist   General bed mobility comments: Assist for all  aspects as pt unable to sequence even with step by step cues    Transfers                   General transfer comment: did not attempt due to no prosthesis here and pt's cognition    Ambulation/Gait                  Stairs            Wheelchair Mobility     Tilt Bed    Modified Rankin (Stroke Patients Only)       Balance Overall balance assessment: Needs assistance Sitting-balance support: Bilateral upper extremity supported, Feet supported Sitting balance-Leahy Scale: Poor Sitting balance - Comments: initially min assist for static sitting. Improved to CGA as well as UE support                                     Pertinent Vitals/Pain Pain Assessment Pain Assessment: Faces Faces Pain Scale: No hurt    Home Living Family/patient expects to be discharged to:: Private residence Living Arrangements: Alone Available Help at Discharge: Family;Available PRN/intermittently Type of Home: Apartment Home Access: Level entry       Home Layout: One level Home Equipment: Rollator (4 wheels);Wheelchair - manual;Tub bench;Cane - single Librarian, Academic (2 wheels) Additional Comments: Home set up from prior admission    Prior Function Prior Level of Function : Independent/Modified Independent             Mobility Comments: ambulates  with prosthetic and walking stick or rollator ADLs Comments: Mod i for ADLs     Extremity/Trunk Assessment   Upper Extremity Assessment Upper Extremity Assessment: Defer to OT evaluation    Lower Extremity Assessment Lower Extremity Assessment: RLE deficits/detail;Generalized weakness RLE Deficits / Details: BKA       Communication   Communication Communication: Other (comment) (mumbles at times) Cueing Techniques: Tactile cues;Verbal cues;Gestural cues  Cognition Arousal: Alert Behavior During Therapy: Flat affect Overall Cognitive Status: Impaired/Different from baseline Area of  Impairment: Orientation, Attention, Memory, Following commands, Safety/judgement, Awareness, Problem solving                 Orientation Level: Disoriented to, Situation, Place, Time Current Attention Level: Sustained Memory: Decreased recall of precautions, Decreased short-term memory Following Commands: Follows one step commands inconsistently, Follows one step commands with increased time Safety/Judgement: Decreased awareness of safety, Decreased awareness of deficits Awareness: Intellectual Problem Solving: Slow processing, Decreased initiation, Difficulty sequencing, Requires verbal cues, Requires tactile cues General Comments: Pt unable to sequence basic mobility. Unable to figure out how to come to sitting EOB. When telling pt we planned to assist him to sit EOB he pointed toward table in corner of room saying he could use that. Unable to make sense of conversation.        General Comments General comments (skin integrity, edema, etc.): VSS    Exercises     Assessment/Plan    PT Assessment Patient needs continued PT services  PT Problem List Decreased strength;Decreased balance;Decreased mobility;Decreased cognition;Decreased safety awareness       PT Treatment Interventions DME instruction;Gait training;Functional mobility training;Therapeutic activities;Therapeutic exercise;Balance training;Cognitive remediation;Patient/family education    PT Goals (Current goals can be found in the Care Plan section)  Acute Rehab PT Goals Patient Stated Goal: not stated PT Goal Formulation: With patient Time For Goal Achievement: 05/28/23 Potential to Achieve Goals: Fair    Frequency Min 1X/week     Co-evaluation               AM-PAC PT 6 Clicks Mobility  Outcome Measure Help needed turning from your back to your side while in a flat bed without using bedrails?: A Lot Help needed moving from lying on your back to sitting on the side of a flat bed without using  bedrails?: Total Help needed moving to and from a bed to a chair (including a wheelchair)?: Total Help needed standing up from a chair using your arms (e.g., wheelchair or bedside chair)?: Total Help needed to walk in hospital room?: Total Help needed climbing 3-5 steps with a railing? : Total 6 Click Score: 7    End of Session   Activity Tolerance: Patient tolerated treatment well Patient left: in bed;with call bell/phone within reach (unable to access bed alarm) Nurse Communication: Mobility status;Other (comment) (touch screen on bed not working so unable to set bed alarm) PT Visit Diagnosis: Other abnormalities of gait and mobility (R26.89);Muscle weakness (generalized) (M62.81)    Time: 1002-1020 PT Time Calculation (min) (ACUTE ONLY): 18 min   Charges:   PT Evaluation $PT Eval Moderate Complexity: 1 Mod   PT General Charges $$ ACUTE PT VISIT: 1 Visit         [facility] PT Acute Rehabilitation Services Office [phone]   [place]gers ORN [facility] 05/14/2023, 11:38 AM

## 2023-05-14 NOTE — Plan of Care (Signed)
  Problem: Respiratory: Goal: Will maintain a patent airway Outcome: Progressing   Problem: Clinical Measurements: Goal: Ability to maintain clinical measurements within normal limits will improve Outcome: Progressing Goal: Respiratory complications will improve Outcome: Progressing Goal: Cardiovascular complication will be avoided Outcome: Progressing   Problem: Activity: Goal: Risk for activity intolerance will decrease Outcome: Progressing   Problem: Nutrition: Goal: Adequate nutrition will be maintained Outcome: Progressing   Problem: Elimination: Goal: Will not experience complications related to bowel motility Outcome: Progressing Goal: Will not experience complications related to urinary retention Outcome: Progressing   Problem: Pain Management: Goal: General experience of comfort will improve Outcome: Progressing   Problem: Skin Integrity: Goal: Risk for impaired skin integrity will decrease Outcome: Progressing   Problem: Education: Goal: Knowledge of risk factors and measures for prevention of condition will improve Outcome: Not Progressing   Problem: Education: Goal: Knowledge of General Education information will improve Description: Including pain rating scale, medication(s)/side effects and non-pharmacologic comfort measures Outcome: Not Progressing   Problem: Coping: Goal: Level of anxiety will decrease Outcome: Not Progressing

## 2023-05-14 NOTE — Progress Notes (Signed)
 Pharmacy ICU Bowel Regimen Consult Note   Current Inpatient Medications for Bowel Management:  Polyethylene glycol 17 g daily PRN (no doses given this admission)  Assessment: Cory [name] is a 78 y.o. year old male admitted on 05/09/2023. Constipation identified as acute opioid-induced constipation . LBM 1/4 x4 following initiation of protocol.  [x]  Bowel sounds present  [x]  No abdominal tenderness  [x]  Passing gas   Plan: Continue Docusate 100 mg PO BID + Senna 2 tabs PO at bedtime + polyethylene glycol 17 gm PO every morning  Given regular BM pharmacy will sign off at this time. Consider deescalation or discontinuation as needed. Thank you for allowing pharmacy to participate in this patient's care.  Randall Call, PharmD PGY2 Critical Care Pharmacy Resident 05/14/2023 7:23 AM

## 2023-05-14 NOTE — Plan of Care (Signed)
  Problem: Education: Goal: Knowledge of risk factors and measures for prevention of condition will improve Outcome: Progressing   Problem: Coping: Goal: Psychosocial and spiritual needs will be supported Outcome: Progressing   Problem: Respiratory: Goal: Will maintain a patent airway Outcome: Progressing Goal: Complications related to the disease process, condition or treatment will be avoided or minimized Outcome: Progressing   Problem: Education: Goal: Knowledge of General Education information will improve Description: Including pain rating scale, medication(s)/side effects and non-pharmacologic comfort measures Outcome: Progressing   Problem: Health Behavior/Discharge Planning: Goal: Ability to manage health-related needs will improve Outcome: Progressing   Problem: Clinical Measurements: Goal: Ability to maintain clinical measurements within normal limits will improve Outcome: Progressing Goal: Will remain free from infection Outcome: Progressing Goal: Diagnostic test results will improve Outcome: Progressing Goal: Respiratory complications will improve Outcome: Progressing Goal: Cardiovascular complication will be avoided Outcome: Progressing   Problem: Activity: Goal: Risk for activity intolerance will decrease Outcome: Progressing   Problem: Nutrition: Goal: Adequate nutrition will be maintained Outcome: Progressing   Problem: Coping: Goal: Level of anxiety will decrease Outcome: Progressing   Problem: Elimination: Goal: Will not experience complications related to bowel motility Outcome: Progressing Goal: Will not experience complications related to urinary retention Outcome: Progressing   Problem: Pain Management: Goal: General experience of comfort will improve Outcome: Progressing   Problem: Safety: Goal: Ability to remain free from injury will improve Outcome: Progressing   Problem: Skin Integrity: Goal: Risk for impaired skin integrity will  decrease Outcome: Progressing   Problem: Safety: Goal: Non-violent Restraint(s) Outcome: Progressing   Problem: Education: Goal: Ability to describe self-care measures that may prevent or decrease complications (Diabetes Survival Skills Education) will improve Outcome: Progressing Goal: Individualized Educational Video(s) Outcome: Progressing   Problem: Coping: Goal: Ability to adjust to condition or change in health will improve Outcome: Progressing   Problem: Fluid Volume: Goal: Ability to maintain a balanced intake and output will improve Outcome: Progressing   Problem: Health Behavior/Discharge Planning: Goal: Ability to identify and utilize available resources and services will improve Outcome: Progressing Goal: Ability to manage health-related needs will improve Outcome: Progressing   Problem: Metabolic: Goal: Ability to maintain appropriate glucose levels will improve Outcome: Progressing   Problem: Nutritional: Goal: Maintenance of adequate nutrition will improve Outcome: Progressing Goal: Progress toward achieving an optimal weight will improve Outcome: Progressing   Problem: Skin Integrity: Goal: Risk for impaired skin integrity will decrease Outcome: Progressing   Problem: Tissue Perfusion: Goal: Adequacy of tissue perfusion will improve Outcome: Progressing

## 2023-05-14 NOTE — Progress Notes (Addendum)
 eLink Physician-Brief Progress Note Patient Name: Cory [name] DOB: May 22, 1945 MRN: [id number]   Date of Service  05/14/2023  HPI/Events of Note  Notified of maroon stools.  Pt had been restarted on elequis for Afib, last dose was given this morning.  Otherwise hemodynamically stable.   eICU Interventions  Send stool sample for FOBT Held elequis.  Started PPI BID.  Check CBC, will trend H/H - plan to transfuse to maintain Hgb >7 or if with hemodynamic compromise.  Check coags.  NPO for now. Will need GI eval.         Sagan Wurzel M DELA CRUZ 05/14/2023, 8:37 PM

## 2023-05-15 DIAGNOSIS — K625 Hemorrhage of anus and rectum: Secondary | ICD-10-CM

## 2023-05-15 DIAGNOSIS — U071 COVID-19: Secondary | ICD-10-CM

## 2023-05-15 DIAGNOSIS — R404 Transient alteration of awareness: Secondary | ICD-10-CM | POA: Diagnosis not present

## 2023-05-15 DIAGNOSIS — D509 Iron deficiency anemia, unspecified: Secondary | ICD-10-CM

## 2023-05-15 DIAGNOSIS — R4182 Altered mental status, unspecified: Secondary | ICD-10-CM | POA: Diagnosis not present

## 2023-05-15 DIAGNOSIS — I4891 Unspecified atrial fibrillation: Secondary | ICD-10-CM

## 2023-05-15 DIAGNOSIS — K6389 Other specified diseases of intestine: Secondary | ICD-10-CM

## 2023-05-15 DIAGNOSIS — C186 Malignant neoplasm of descending colon: Secondary | ICD-10-CM

## 2023-05-15 LAB — PROTIME-INR
INR: 1.3 — ABNORMAL HIGH (ref 0.8–1.2)
Prothrombin Time: 16.7 s — ABNORMAL HIGH (ref 11.4–15.2)

## 2023-05-15 LAB — CBC
HCT: 26 % — ABNORMAL LOW (ref 39.0–52.0)
Hemoglobin: 8.1 g/dL — ABNORMAL LOW (ref 13.0–17.0)
MCH: 23.7 pg — ABNORMAL LOW (ref 26.0–34.0)
MCHC: 31.2 g/dL (ref 30.0–36.0)
MCV: 76 fL — ABNORMAL LOW (ref 80.0–100.0)
Platelets: 237 10*3/uL (ref 150–400)
RBC: 3.42 MIL/uL — ABNORMAL LOW (ref 4.22–5.81)
RDW: 16.1 % — ABNORMAL HIGH (ref 11.5–15.5)
WBC: 5.7 10*3/uL (ref 4.0–10.5)
nRBC: 0 % (ref 0.0–0.2)

## 2023-05-15 LAB — GLUCOSE, CAPILLARY
Glucose-Capillary: 202 mg/dL — ABNORMAL HIGH (ref 70–99)
Glucose-Capillary: 152 mg/dL — ABNORMAL HIGH (ref 70–99)
Glucose-Capillary: 101 mg/dL — ABNORMAL HIGH (ref 70–99)
Glucose-Capillary: 114 mg/dL — ABNORMAL HIGH (ref 70–99)

## 2023-05-15 LAB — BASIC METABOLIC PANEL
Anion gap: 11 (ref 5–15)
BUN: 49 mg/dL — ABNORMAL HIGH (ref 8–23)
CO2: 24 mmol/L (ref 22–32)
Calcium: 8.2 mg/dL — ABNORMAL LOW (ref 8.9–10.3)
Chloride: 110 mmol/L (ref 98–111)
Creatinine, Ser: 3.07 mg/dL — ABNORMAL HIGH (ref 0.61–1.24)
GFR, Estimated: 20 mL/min — ABNORMAL LOW (ref >60)
Glucose, Bld: 217 mg/dL — ABNORMAL HIGH (ref 70–99)
Potassium: 4.2 mmol/L (ref 3.5–5.1)
Sodium: 145 mmol/L (ref 135–145)

## 2023-05-15 LAB — APTT
aPTT: 24 s (ref 24–36)
aPTT: 36 s (ref 24–36)

## 2023-05-15 LAB — HEPARIN LEVEL (UNFRACTIONATED): Heparin Unfractionated: 0.72 [IU]/mL — ABNORMAL HIGH (ref 0.30–0.70)

## 2023-05-15 MED ORDER — SODIUM CHLORIDE 0.45 % IV SOLN: INTRAVENOUS | Status: DC

## 2023-05-15 MED ORDER — TAMSULOSIN HCL 0.4 MG PO CAPS
0.4000 mg | ORAL_CAPSULE | Freq: Every day | ORAL | Status: DC
  Administered 2023-05-15 – 2023-05-22 (×8): 0.4 mg via ORAL
  Filled 2023-05-15 (×8): qty 1

## 2023-05-15 MED ORDER — HEPARIN SODIUM (PORCINE) 5000 UNIT/ML IJ SOLN: 5000.0000 [IU] | Freq: Three times a day (TID) | INTRAMUSCULAR | Status: DC

## 2023-05-15 MED ORDER — PANTOPRAZOLE SODIUM 40 MG PO TBEC
40.0000 mg | DELAYED_RELEASE_TABLET | Freq: Two times a day (BID) | ORAL | Status: DC
  Administered 2023-05-15 – 2023-05-22 (×14): 40 mg via ORAL
  Filled 2023-05-15 (×14): qty 1

## 2023-05-15 MED ORDER — INSULIN GLARGINE-YFGN 100 UNIT/ML ~~LOC~~ SOLN: 10.0000 [IU] | Freq: Every day | SUBCUTANEOUS | Status: DC

## 2023-05-15 MED ORDER — HEPARIN (PORCINE) 25000 UT/250ML-% IV SOLN
1050.0000 [IU]/h | INTRAVENOUS | Status: DC
  Administered 2023-05-15 (×2): 900 [IU]/h via INTRAVENOUS
  Filled 2023-05-15: qty 250

## 2023-05-15 MED ORDER — LORAZEPAM 2 MG/ML IJ SOLN
0.5000 mg | Freq: Once | INTRAMUSCULAR | Status: AC
  Administered 2023-05-15: 0.5 mg via INTRAVENOUS
  Filled 2023-05-15: qty 1

## 2023-05-15 MED ORDER — INSULIN GLARGINE-YFGN 100 UNIT/ML ~~LOC~~ SOLN
10.0000 [IU] | Freq: Two times a day (BID) | SUBCUTANEOUS | Status: DC
  Administered 2023-05-15 (×2): 10 [IU] via SUBCUTANEOUS
  Filled 2023-05-15 (×4): qty 0.1

## 2023-05-15 NOTE — Plan of Care (Signed)
  Problem: Education: Goal: Knowledge of risk factors and measures for prevention of condition will improve Outcome: Progressing   Problem: Coping: Goal: Psychosocial and spiritual needs will be supported Outcome: Progressing   Problem: Respiratory: Goal: Will maintain a patent airway Outcome: Progressing Goal: Complications related to the disease process, condition or treatment will be avoided or minimized Outcome: Progressing   Problem: Education: Goal: Knowledge of General Education information will improve Description: Including pain rating scale, medication(s)/side effects and non-pharmacologic comfort measures Outcome: Progressing   Problem: Health Behavior/Discharge Planning: Goal: Ability to manage health-related needs will improve Outcome: Progressing   Problem: Clinical Measurements: Goal: Ability to maintain clinical measurements within normal limits will improve Outcome: Progressing Goal: Will remain free from infection Outcome: Progressing Goal: Diagnostic test results will improve Outcome: Progressing Goal: Respiratory complications will improve Outcome: Progressing Goal: Cardiovascular complication will be avoided Outcome: Progressing   Problem: Activity: Goal: Risk for activity intolerance will decrease Outcome: Progressing   Problem: Nutrition: Goal: Adequate nutrition will be maintained Outcome: Progressing   Problem: Coping: Goal: Level of anxiety will decrease Outcome: Progressing   Problem: Elimination: Goal: Will not experience complications related to bowel motility Outcome: Progressing Goal: Will not experience complications related to urinary retention Outcome: Progressing   Problem: Pain Management: Goal: General experience of comfort will improve Outcome: Progressing   Problem: Safety: Goal: Ability to remain free from injury will improve Outcome: Progressing   Problem: Skin Integrity: Goal: Risk for impaired skin integrity will  decrease Outcome: Progressing   Problem: Safety: Goal: Non-violent Restraint(s) Outcome: Progressing   Problem: Education: Goal: Ability to describe self-care measures that may prevent or decrease complications (Diabetes Survival Skills Education) will improve Outcome: Progressing Goal: Individualized Educational Video(s) Outcome: Progressing   Problem: Coping: Goal: Ability to adjust to condition or change in health will improve Outcome: Progressing   Problem: Fluid Volume: Goal: Ability to maintain a balanced intake and output will improve Outcome: Progressing   Problem: Health Behavior/Discharge Planning: Goal: Ability to identify and utilize available resources and services will improve Outcome: Progressing Goal: Ability to manage health-related needs will improve Outcome: Progressing   Problem: Metabolic: Goal: Ability to maintain appropriate glucose levels will improve Outcome: Progressing   Problem: Nutritional: Goal: Maintenance of adequate nutrition will improve Outcome: Progressing Goal: Progress toward achieving an optimal weight will improve Outcome: Progressing   Problem: Skin Integrity: Goal: Risk for impaired skin integrity will decrease Outcome: Progressing   Problem: Tissue Perfusion: Goal: Adequacy of tissue perfusion will improve Outcome: Progressing

## 2023-05-15 NOTE — Progress Notes (Signed)
 PHARMACY - ANTICOAGULATION CONSULT NOTE  Pharmacy Consult for heparin  Indication: atrial fibrillation  No Known Allergies  Patient Measurements: Height: 5' 11 (180.3 cm) Weight:  (Unable to weigh d/t bed malfunction) IBW/kg (Calculated) : 75.3 Heparin  Dosing Weight: 88.5 kg  Vital Signs: Temp: 97.6 F (36.4 C) (01/06 1928) Temp Source: Oral (01/06 1928) BP: 161/97 (01/06 2100) Pulse Rate: 90 (01/06 2100)  Labs: Recent Labs    05/13/23 0014 05/13/23 0444 05/13/23 0444 05/14/23 0505 05/14/23 2227 05/15/23 0643 05/15/23 2114  HGB  --  7.9*   < > 8.2* 8.3* 8.1*  --   HCT  --  24.7*   < > 26.3* 26.7* 26.0*  --   PLT  --  140*   < > 199 224 237  --   APTT  --   --   --   --  24  --  36  LABPROT  --   --   --   --  16.7*  --   --   INR  --   --   --   --  1.3*  --   --   HEPARINUNFRC 0.43  --   --   --   --   --  0.72*  CREATININE  --  4.25*  --  3.58*  --  3.07*  --    < > = values in this interval not displayed.    Estimated Creatinine Clearance: 24 mL/min (A) (by C-G formula based on SCr of 3.07 mg/dL (H)).  Assessment: 77 YOM found unresponsive and seizing for unknown time, at least 2 days. Patient on Eliquis  PTA, anticoagulation held on admission. Pharmacy consulted for heparin  dosing.   Heparin  was discontinued on 1/4 and patient was transitioned back to Eliquis . On 1/5 PM, MD notified of patient having maroon stools. Eliquis  was held 1/5 after 0800 dose, transitioning patient back to heparin  per MD request. Due to recent Eliquis  dose, will monitor heparin  levels and aPTT until levels correlate. Heparin  previously therapeutic on 900 units/hr. Hgb improved to 8.1, PLT 237.   aPTT subtherapeutic (36 sec) on heparin  infusion at 900 units/hr. Heparin  level 0.72 (falsely elevated by Eliquis ). RN notes that pt is still having bloody stools but no issues with gtt. GI following- doesn't plan for endoscopic interventions unless significant life-threatening bleed.   Goal of  Therapy:  Heparin  level 0.3-0.5 units/ml - lower goal range due to increase bleeding risk aPTT 66-85 seconds Monitor platelets by anticoagulation protocol: Yes   Plan:  Increase heparin  slightly to 950 units/hr - no bolus due to bleeding risk Check heparin  level and aPTT in 8 hours. F/u CBC at this time also.  Vito Ralph, PharmD, BCPS Please see amion for complete clinical pharmacist phone list 05/15/2023 9:44 PM

## 2023-05-15 NOTE — Inpatient Diabetes Management (Addendum)
 Inpatient Diabetes Program Recommendations  AACE/ADA: New Consensus Statement on Inpatient Glycemic Control (2015)  Target Ranges:  Prepandial:   less than 140 mg/dL      Peak postprandial:   less than 180 mg/dL (1-2 hours)      Critically ill patients:  140 - 180 mg/dL    Latest Reference Range & Units 05/12/23 23:53 05/13/23 04:02 05/13/23 07:44 05/13/23 11:18 05/13/23 11:20 05/13/23 11:52 05/13/23 15:23 05/13/23 15:25  Glucose-Capillary 70 - 99 mg/dL 875 (H)  836 (H) 892 (H) 33 (LL) 36 (LL) 107 (H) 19 (LL) 51 (L)    Latest Reference Range & Units 05/13/23 16:55 05/13/23 19:17 05/13/23 19:33 05/13/23 21:53  Glucose-Capillary 70 - 99 mg/dL 871 (H) 47 (L) 76 72    Latest Reference Range & Units 05/13/23 23:32 05/14/23 05:03 05/14/23 11:17 05/14/23 15:17 05/14/23 17:21 05/14/23 19:10  Glucose-Capillary 70 - 99 mg/dL 75 79 875 (H) 773 (H) 750 (H) 309 (H)  (H): Data is abnormally high  Latest Reference Range & Units 05/15/23 07:28  Glucose-Capillary 70 - 99 mg/dL 797 (H)  (H): Data is abnormally high     Home DM Meds: Humalog  2-10 units daily as needed for CBG >200      Lantus  26 units daily   Current Orders: Novolog  Sensitive Correction Scale/ SSI (0-9 units) TID AC + HS    MD- Note Severe Hypoglycemia on 01/04.  Semglee  Insulin  was stopped.  Last Dose Semglee  (29 units) given 9am on 01/04.  CBGs elevated last PM and AM CBG today 202  May consider starting a portion of pt's home dose basal insulin : Recommend Semglee  13 units daily (50% home dose) Please start this AM   --Will follow patient during hospitalization--  Adina Rudolpho Arrow RN, MSN, CDCES Diabetes Coordinator Inpatient Glycemic Control Team Team Pager: [phone] (8a-5p)

## 2023-05-15 NOTE — Progress Notes (Addendum)
 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 HTN HLD DM TY 2 Had a h/o colonoscopy in 10//2024--is supposed to have colon surgery for possible end-end ileostomy Atrial flutter/HFpEF CHADVASC >3 on Eliquis  diagnosed 12/2022-last echo EF 60-65% valves normal Previous admission 02/16/2023-02/19/2023 for presyncope transient left arm heaviness--also previously admitted for AKI and admitted with non-ketotic state  12/31 brought to emergency room from home-was found seizing GCS 3 right gaze preference-glucose 700 lactic acid 2.0 procalcitonin 4.5-potassium 2.8 intubated in ED Felt initially to have altered mental status with low probability for meningitis but was on antibiotics and antivirals CK was 11,000 from admission Creatinine up from 1-4.4  Events 12/31 admitted seizing intubated DKA protocol 1/3 mental status not improving-repeat EEG severe to profound diffuse encephalopathy 1/4 extubated  procedures 12/31 EEG cortical dysfunction bifrontal region 2/2 underlying structural abnormality--- COVID positive-Art line right IJ Foley placed   Plan  Toxic metabolic encephalopathy on admission unclear etiology felt initially to be may be meningitis Meningitis entertained but not confirmed-BCx 212/31 no growth-mentation has improved we have alternate diagnoses of probable hyperglycemia causing the confusion See below Seizures on admission Probably secondary to hyperglycemia EEG as above-neurology last saw on 1/4 and signed off because more appropriate and less confused COVID-positive and ESBL E. coli growing in urine culture Received COVID directed therapy remdesivir  for 3 days until 1/3+ steroids---Is on room air so think that this was all rather coincidental ESBL was  thought to be asymptomatic bacteriuria was treated with empiric ceftriaxone  ending on 1/4 and no further treatment or advanced therapies is required Hyperosmolar nonketotic state on admission--DM2--(Home med Lantus  26 units lispro to 10 units >CBG 200- some hypoglycemia during hospitalization Blood sugars were in the 700s patient probably had seizures from hyperglycemia Hyperglycemia persists he is eating about 25% of meals he is on SSI alone-add mealtime coverage--- will add 10 units of Lantus  today and titrate A-flutter CHADVASC >3 on Eliquis  Resumed Coreg  12.5 twice daily--Keep on monitors today Some report of bleeding therefore Eliquis  stopped placed on therapeutic heparin  Colon cancer not operated on yet with some bleeding this hospital stay Diagnosed 02/2023--need non-emergent OP Arnando Some reports of bleeding from the rectum but hemoglobin stable--- he has known colon cancer-as has bled with FOBT + will need eval by GI/maybe gen surg Would continue cautiously heparin --continue Protonix  40 p.o. twice daily AKI superimposed on CKD 3B --nl creatinine per niece about 4 possibly secondary to her rhabdomyolysis on admission/severe hypokalemia Hypernatremia during hospitalization Patient retaining about 240 cc with no prior history of retention creatinine is slightly improved In-N-Out cath today--bladder scan q shift and place cath if retains more than x 1 more PTA torsemide  20 held--- continues on NS 40 cc/H Resume Flomax  0.4 daily Microscopic hematuria Needs outpatient follow-up   Discussed with the patient's niece Cory [name] phone #[phone]--he is moe confused than usual--he couldn't remember how to use his phone and was struggling to remember things Expect mentation will have to clear and we will have to decide on  DVT prophylaxis: Eliquis   Status is: Inpatient Remains inpatient appropriate because:   Requires further improvement    Subjective: Quite animated but does not answer  clearly some questions poor memory of what meds  he takes No chest pain Nursing reports placed catheter for In-N-Out as well as retaining No fever no chills I am not really able to get a clear ROS out of him  Objective + exam Vitals:   05/15/23 0500 05/15/23 0600 05/15/23 0700 05/15/23 0730  BP: (!) 158/125 (!) 154/94  (!) 158/89  Pulse: 89 98 80 81  Resp: 17 (!) 23 18 14   Temp:    98 F (36.7 C)  TempSrc:    Axillary  SpO2: 97% 97% 96% 99%  Weight:      Height:       Filed Weights   05/10/23 0500 05/11/23 0406 05/13/23 0436  Weight: 85 kg 91.1 kg 94.1 kg    Examination: Alert x 1 coherent coherent No wheeze S1-S2 seems to be in rate controlled a flutter on monitors Abdomen slightly obese nontender no rebound No lower extremity edema Does confabulate little bit and is a little bit tangential  Data Reviewed: reviewed   CBC    Component Value Date/Time   WBC 5.7 05/15/2023 0643   RBC 3.42 (L) 05/15/2023 0643   HGB 8.1 (L) 05/15/2023 0643   HCT 26.0 (L) 05/15/2023 0643   PLT 237 05/15/2023 0643   MCV 76.0 (L) 05/15/2023 0643   MCH 23.7 (L) 05/15/2023 0643   MCHC 31.2 05/15/2023 0643   RDW 16.1 (H) 05/15/2023 0643   LYMPHSABS 0.8 05/09/2023 1034   MONOABS 0.9 05/09/2023 1034   EOSABS 0.0 05/09/2023 1034   BASOSABS 0.0 05/09/2023 1034      Latest Ref Rng & Units 05/14/2023    5:05 AM 05/13/2023    4:44 AM 05/12/2023    5:06 AM  CMP  Glucose 70 - 99 mg/dL 85  864  897   BUN 8 - 23 mg/dL 53  66  67   Creatinine 0.61 - 1.24 mg/dL 6.41  5.74  5.09   Sodium 135 - 145 mmol/L 151  149  145   Potassium 3.5 - 5.1 mmol/L 3.7  3.5  3.5   Chloride 98 - 111 mmol/L 113  112  108   CO2 22 - 32 mmol/L 27  26  24    Calcium  8.9 - 10.3 mg/dL 8.3  8.3  8.2   Total Protein 6.5 - 8.1 g/dL   5.4   Total Bilirubin 0.0 - 1.2 mg/dL   0.5   Alkaline Phos 38 - 126 U/L   49   AST 15 - 41 U/L   103   ALT 0 - 44 U/L   49     Scheduled Meds:  carvedilol   12.5 mg Oral BID WC    Chlorhexidine  Gluconate Cloth  6 each Topical Daily   docusate sodium   100 mg Oral BID   insulin  aspart  0-5 Units Subcutaneous QHS   insulin  aspart  0-9 Units Subcutaneous TID WC   multivitamin with minerals  1 tablet Oral Daily   pantoprazole  (PROTONIX ) IV  40 mg Intravenous Q12H   polyethylene glycol  17 g Oral Daily   senna  2 tablet Oral QHS   Continuous Infusions:  sodium chloride  40 mL/hr at 05/15/23 0600    Time 60  Colen Grimes, MD  [facility]

## 2023-05-15 NOTE — Consult Note (Addendum)
 [name] [birth year]  [id number].    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 He was intubated in the ED for airway protection due to GCS 3. On arrival he was febrile (102.3) with blood glucose >600 and noted to be COVID positive. 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 He was admitted to the ICU for aggressive treatment of DKA and rhabdomyolysis. He did go into afib with RVR, so heparin  drip was started. Neurology was consulted and suspect seizures were secondary to hyperglycemia, EEG showed severe encephalopathy. Encephalopathy improved some and he was extubated on 1/4. Eliquis  was resumed and on 1/6 the patient reportedly had blood per rectum, maroon colored. He was transitioned back to heparin  gtt. He has a sigmoid colon cancer that was diagnosed as part of an outpatient anemia workup. He was referred to general surgeon Dr. Moira who ordered a PET scan performed 12/11 showing descending colon mass without evidence of metastasis. CEA on 12/12 was 21.6.He was tentatively scheduled for surgical resection of the mass 05/18/23 by Dr. Moira. Our general surgery service is asked to consult due to bleeding colon mass in the setting of anticoagulation.  The patient is confused and unable to provide any history.  His 2 nieces are present at bedside and help with his history and are very pleasant.  He reports his last BM was today and it did have some maroon color in it per his niece.  Last dose of  Eliquis  was yesterday 1/5 at 0808.   Past abdominal surgeries include appendectomy. The patient denies tobacco, alochol, or drug use. At baseline he lives in a apartment alone and is independent of ADLs.    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 He has never used smokeless tobacco. He reports that he does not drink alcohol and does not use drugs.  Allergies: No Known Allergies  Medications Prior to Admission  Medication Sig Dispense Refill   acetaminophen  (TYLENOL ) 325 MG tablet Take 2 tablets (650 mg total) by mouth every 6 (six) hours as needed for mild pain, headache or fever (or Fever >/= 101).     apixaban  (ELIQUIS ) 5  MG TABS tablet Take 1 tablet (5 mg total) by mouth 2 (two) times daily. 60 tablet 3   atorvastatin  (LIPITOR) 20 MG tablet Take 20 mg by mouth at bedtime.     carvedilol  (COREG ) 25 MG tablet Take 25 mg by mouth 2 (two) times daily with a meal.     insulin  lispro (HUMALOG  KWIKPEN) 100 UNIT/ML KwikPen Inject 2-10 Units into the skin daily as needed (BS > 200 per).     Insulin  Pen Needle (B-D UF III MINI PEN NEEDLES) 31G X 5 MM MISC 1 (one) Each daily 100 each 3   LANTUS  SOLOSTAR 100 UNIT/ML Solostar  Pen Inject 26 Units into the skin daily.     metoprolol  tartrate (LOPRESSOR ) 50 MG tablet Take 1 tablet (50 mg total) by mouth 2 (two) times daily. 60 tablet 1   neomycin  (MYCIFRADIN ) 500 MG tablet Take 1,000 mg by mouth in the morning and at bedtime.     nystatin (MYCOSTATIN) 100000 UNIT/ML suspension Take 5 mLs by mouth 4 (four) times daily.     potassium chloride  (KLOR-CON  M) 10 MEQ tablet Take 2 tablets (20 mEq total) by mouth daily. 60 tablet 0   potassium chloride  (KLOR-CON ) 10 MEQ tablet Take 20 mEq by mouth daily.     sertraline  (ZOLOFT ) 50 MG tablet Take 50 mg by mouth daily.     tamsulosin  (FLOMAX ) 0.4 MG CAPS capsule Take 1 capsule (0.4 mg total) by mouth daily.     torsemide  (DEMADEX ) 20 MG tablet Take 20 mg by mouth daily.     Vitamin D , Ergocalciferol , (DRISDOL ) 1.25 MG (50000 UNIT) CAPS capsule Take 50,000 Units by mouth every Sunday.       Physical Exam: Blood pressure (!) 147/84, pulse 84, temperature 98.1 F (36.7 C), temperature source Oral, resp. rate 20, height 5' 11 (1.803 m), weight 94.1 kg, SpO2 99%. General: very pleasant but confused male, laying in hospital bed, appears stated age, NAD. HEENT: head -normocephalic, atraumatic; Eyes: PERRLA, no conjunctival injection, anicteric sclerae CV- irregular, but not tachy, no obvious murmurs. Palpable L pedal pulse.  R BKA Pulm- CTAB, no W,R,R notes.  Respiratory effort nonlabored. Abd- soft, NT/ND, +BS no masses palpable (there is mild fullness on the right side, but nothing overt), hernias, or organomegaly. Psych- Alert, confused and not oriented but to self.      Results for orders placed or performed during the hospital encounter of 05/09/23 (from the past 48 hours)  Glucose, capillary     Status: Abnormal   Collection Time: 05/13/23  3:23 PM  Result Value Ref Range   Glucose-Capillary 19 (LL) 70 - 99 mg/dL    Comment: Glucose reference range applies only to samples taken after fasting for at least 8 hours.    Comment 1 Notify RN   Glucose, capillary     Status: Abnormal   Collection Time: 05/13/23  3:25 PM  Result Value Ref Range   Glucose-Capillary 51 (L) 70 - 99 mg/dL    Comment: Glucose reference range applies only to samples taken after fasting for at least 8 hours.  Glucose, capillary     Status: Abnormal   Collection Time: 05/13/23  4:55 PM  Result Value Ref Range   Glucose-Capillary 128 (H) 70 - 99 mg/dL    Comment: Glucose reference range applies only to samples taken after fasting for at least 8 hours.  Glucose, capillary     Status: Abnormal   Collection Time: 05/13/23  7:17 PM  Result Value  Ref Range   Glucose-Capillary 47 (L) 70 - 99 mg/dL    Comment: Glucose reference range applies only to samples taken after fasting for at least 8 hours.  Glucose, capillary     Status: None   Collection Time: 05/13/23  7:33 PM  Result Value Ref Range   Glucose-Capillary 76 70 - 99 mg/dL    Comment: Glucose reference range applies only to samples taken after fasting for at least 8 hours.  Glucose, capillary     Status: None   Collection Time: 05/13/23  9:53 PM  Result Value Ref Range   Glucose-Capillary 72 70 - 99 mg/dL    Comment: Glucose reference range applies only to samples taken after fasting for at least 8 hours.  Glucose, capillary     Status: None   Collection Time: 05/13/23 11:32 PM  Result Value Ref Range   Glucose-Capillary 75 70 - 99 mg/dL    Comment: Glucose reference range applies only to samples taken after fasting for at least 8 hours.  Glucose, capillary     Status: None   Collection Time: 05/14/23  5:03 AM  Result Value Ref Range   Glucose-Capillary 79 70 - 99 mg/dL    Comment: Glucose reference range applies only to samples taken after fasting for at least 8 hours.  CBC     Status: Abnormal   Collection Time: 05/14/23  5:05 AM  Result Value Ref Range   WBC 7.6 4.0 - 10.5 K/uL   RBC 3.47 (L) 4.22 - 5.81 MIL/uL   Hemoglobin 8.2 (L) 13.0 - 17.0 g/dL    Comment:  Reticulocyte Hemoglobin testing may be clinically indicated, consider ordering this additional test [id number]    HCT 26.3 (L) 39.0 - 52.0 %   MCV 75.8 (L) 80.0 - 100.0 fL   MCH 23.6 (L) 26.0 - 34.0 pg   MCHC 31.2 30.0 - 36.0 g/dL   RDW 83.8 (H) 88.4 - 84.4 %   Platelets 199 150 - 400 K/uL   nRBC 0.0 0.0 - 0.2 %    Comment: Performed at [facility], [address] [place], [place] [address]  Magnesium      Status: None   Collection Time: 05/14/23  5:05 AM  Result Value Ref Range   Magnesium  2.3 1.7 - 2.4 mg/dL    Comment: Performed at [facility], [address], [place], [place] [address]  Phosphorus     Status: None   Collection Time: 05/14/23  5:05 AM  Result Value Ref Range   Phosphorus 3.5 2.5 - 4.6 mg/dL    Comment: Performed at [facility], [address] [place], [place] [address]  Basic metabolic panel     Status: Abnormal   Collection Time: 05/14/23  5:05 AM  Result Value Ref Range   Sodium 151 (H) 135 - 145 mmol/L   Potassium 3.7 3.5 - 5.1 mmol/L   Chloride 113 (H) 98 - 111 mmol/L   CO2 27 22 - 32 mmol/L   Glucose, Bld 85 70 - 99 mg/dL    Comment: Glucose reference range applies only to samples taken after fasting for at least 8 hours.   BUN 53 (H) 8 - 23 mg/dL   Creatinine, Ser 6.41 (H) 0.61 - 1.24 mg/dL   Calcium  8.3 (L) 8.9 - 10.3 mg/dL   GFR, Estimated 17 (L) >60 mL/min    Comment: (NOTE) Calculated using the CKD-EPI Creatinine Equation (2021)    Anion gap 11 5 -  15    Comment: Performed at [facility], [address], [place], [place] [address]  Glucose, capillary     Status: Abnormal   Collection Time: 05/14/23 11:17 AM  Result Value Ref Range   Glucose-Capillary 124 (H) 70 - 99 mg/dL    Comment: Glucose reference range applies only to samples taken after fasting for at least 8 hours.  Glucose, capillary     Status: Abnormal   Collection Time: 05/14/23  3:17 PM  Result Value Ref Range   Glucose-Capillary 226 (H) 70 - 99  mg/dL    Comment: Glucose reference range applies only to samples taken after fasting for at least 8 hours.  Glucose, capillary     Status: Abnormal   Collection Time: 05/14/23  5:21 PM  Result Value Ref Range   Glucose-Capillary 249 (H) 70 - 99 mg/dL    Comment: Glucose reference range applies only to samples taken after fasting for at least 8 hours.  Glucose, capillary     Status: Abnormal   Collection Time: 05/14/23  7:10 PM  Result Value Ref Range   Glucose-Capillary 309 (H) 70 - 99 mg/dL    Comment: Glucose reference range applies only to samples taken after fasting for at least 8 hours.  Occult blood card to lab, stool     Status: Abnormal   Collection Time: 05/14/23  9:41 PM  Result Value Ref Range   Fecal Occult Bld POSITIVE (A) NEGATIVE    Comment: Performed at [facility], [address] [place], [place] [address]  Protime-INR     Status: Abnormal   Collection Time: 05/14/23 10:27 PM  Result Value Ref Range   Prothrombin Time 16.7 (H) 11.4 - 15.2 seconds   INR 1.3 (H) 0.8 - 1.2    Comment: (NOTE) INR goal varies based on device and disease states. Performed at [facility], [address], [place], [place] [address]   APTT     Status: None   Collection Time: 05/14/23 10:27 PM  Result Value Ref Range   aPTT 24 24 - 36 seconds    Comment: Performed at [facility], [address], [place], [place] [address]  CBC     Status: Abnormal   Collection Time: 05/14/23 10:27 PM  Result Value Ref Range   WBC 6.1 4.0 - 10.5 K/uL   RBC 3.50 (L) 4.22 - 5.81 MIL/uL   Hemoglobin 8.3 (L) 13.0 - 17.0 g/dL    Comment: Reticulocyte Hemoglobin testing may be clinically indicated, consider ordering this additional test [id number]    HCT 26.7 (L) 39.0 - 52.0 %   MCV 76.3 (L) 80.0 - 100.0 fL   MCH 23.7 (L) 26.0 - 34.0 pg   MCHC 31.1 30.0 - 36.0 g/dL   RDW 83.7 (H) 88.4 - 84.4 %   Platelets 224 150 - 400 K/uL   nRBC 0.0 0.0 - 0.2 %    Comment: Performed at [facility], [address], [place], [place] [address]  Basic metabolic panel     Status: Abnormal   Collection Time: 05/15/23  6:43 AM  Result Value Ref Range   Sodium 145 135 - 145 mmol/L   Potassium 4.2 3.5 - 5.1 mmol/L   Chloride 110 98 - 111 mmol/L   CO2 24 22 - 32 mmol/L   Glucose, Bld 217 (H) 70 - 99 mg/dL    Comment: Glucose reference range applies only to samples taken after  fasting for at least 8 hours.   BUN 49 (H) 8 - 23 mg/dL   Creatinine, Ser 6.92 (H) 0.61 - 1.24 mg/dL   Calcium  8.2 (L) 8.9 - 10.3 mg/dL   GFR, Estimated 20 (L) >60 mL/min    Comment: (NOTE) Calculated using the CKD-EPI Creatinine Equation (2021)    Anion gap 11 5 - 15    Comment: Performed at [facility], [address], [place], [place] [address]  CBC     Status: Abnormal   Collection Time: 05/15/23  6:43 AM  Result Value Ref Range   WBC 5.7 4.0 - 10.5 K/uL   RBC 3.42 (L) 4.22 - 5.81 MIL/uL   Hemoglobin 8.1 (L) 13.0 - 17.0 g/dL    Comment: Reticulocyte Hemoglobin testing may be clinically indicated, consider ordering this additional test [id number]    HCT 26.0 (L) 39.0 - 52.0 %   MCV 76.0 (L) 80.0 - 100.0 fL   MCH 23.7 (L) 26.0 - 34.0 pg   MCHC 31.2 30.0 - 36.0 g/dL   RDW 83.8 (H) 88.4 - 84.4 %   Platelets 237 150 - 400 K/uL   nRBC 0.0 0.0 - 0.2 %    Comment: Performed at [facility], [address] [place], [place] [address]  Glucose, capillary     Status: Abnormal   Collection Time: 05/15/23  7:28 AM  Result Value Ref Range   Glucose-Capillary 202 (H) 70 - 99 mg/dL    Comment: Glucose reference range applies only to samples taken after fasting for at least 8 hours.  Glucose, capillary     Status: Abnormal   Collection Time: 05/15/23 11:38 AM  Result Value Ref Range   Glucose-Capillary 152 (H) 70 - 99 mg/dL    Comment: Glucose reference range applies only to samples taken after fasting for at least 8 hours.   No results found.    Assessment/Plan Splenic flexure mass  with melena The patient has been seen, examined, labs, vitals, imaging, chart, care everywhere chart, all reviewed.  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 Moira.  He is actually scheduled for resection on 05/18/23.  Unfortunately, the patient has been admitted with seizures likely secondary to hyperglycemia/DKA.  He still has significant encephalopathy and is unable to contribute to his history.  He has poorly controlled diabetes and multiple other medical co-morbidities that have been exacerbated this admission.  His hgb has remained stable in the 8s during this stay despite some melena and being intermittently on Eliquis  and heparin .  Ideally, it would be best for the patient to improve and recover from this insult during this hospital stay and plan for outpatient elective operation; however, the bleeding does complicate the matter and could force our hands.  With that being said, he has not required a transfusion and this is not new for him.  He may resume his diet for now.  We will continue to follow to determine the best course of action.  Agree with GI that there is no intervention currently needed by their team at this time.  Of note his CEA is 21.6.  His 2 nieces were at the bedside and very realistic and asked great questions.  His twin sister is who helps him make medical decisions.  He remains on heparin  gtt.  It would be nice to hold all anticoagulation to minimize his bleeding, but with his malignancy and a fib, he is  going to be more at risk than the average person for clots so not sure this is a great option.  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  Discussed patient at the bedside with Dr. Nandigam as well  Burnard FORBES Banter, [facility] Surgery 05/15/2023, 2:12 PM Please see Amion for pager number during day hours 7:00am-4:30pm or 7:00am -11:30am on weekends

## 2023-05-15 NOTE — TOC Initial Note (Signed)
 Transition of Care [facility]) - Initial/Assessment Note    Patient Details  Name: Cory [name] MRN: [id number] Date of Birth: [birth year]  Transition of Care [facility]) CM/SW Contact:    Cory FORBES Saa, LCSW Phone Number: 05/15/2023, 4:43 PM   Clinical Narrative:  4:43 PM Per chart review, therapy recommended SNF for patient upon discharge. Patient has recently been extubated and is not currently fully oriented. CSW called patient's niece, Cory [name], to inform her of therapy recommendation. CSW answered Cory [name]'s questions regarding SNF. Cory [name] stated that she was in agreeance with therapy recommendation and consented CSW to submit referrals to SNFs around [place]. Cory [name] requested CSW to email options and Medicare ratings ([email or web address]).  Transition of Care Asessment: Insurance and Status: Insurance coverage has been reviewed Patient has primary care physician: Yes Home environment has been reviewed: Private Residence Prior level of function:: Independent/Modified Independent Prior/Current Home Services: No current home services Social Drivers of Health Review:  (Patient unable to answer) Readmission risk has been reviewed: Yes Transition of care needs: transition of care needs identified, TOC will continue to follow   Admission diagnosis:  Altered mental status, unspecified altered mental status type [R41.82] AMS (altered mental status) [R41.82] Sepsis with encephalopathy without septic shock, due to unspecified organism (HCC) [A41.9, R65.20, G93.41] Patient Active Problem List   Diagnosis Date Noted   AMS (altered mental status) 05/09/2023   Syncope 02/16/2023   Atrial flutter with rapid ventricular response (HCC) 12/27/2022   UTI (urinary tract infection) 12/27/2022   Acute metabolic encephalopathy 12/27/2022   Elevated troponin 12/27/2022   Elevated lipase 12/27/2022   Generalized weakness 12/27/2022   Poorly controlled type 2 diabetes mellitus (HCC) 12/26/2022   AKI (acute kidney  injury) (HCC) 12/26/2022   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/02/2021   Moderate protein-calorie malnutrition (HCC)    Septic shock (HCC) 11/27/2020   Overweight (BMI 25.0-29.9) 11/27/2020   Hyperlipidemia 11/26/2020   Hyperglycemia 11/26/2020   Hypertension 11/26/2020   PCP:  Cory [name]., MD Pharmacy:   [id number] - [place], [place] - [address] [name] [place] [address] Phone: [phone] Fax: [phone]  [name] [address] [place] [address] Phone: [phone] Fax: [phone]     Social Drivers of Health (SDOH) Social History: SDOH Screenings   Food Insecurity: Patient Unable To Answer (05/10/2023)  Housing: Patient Unable To Answer (05/10/2023)  Transportation Needs: Patient Unable To Answer (05/10/2023)  Utilities: Patient Unable To Answer (05/10/2023)  Social Connections: Patient Unable To Answer (05/10/2023)  Tobacco Use: Low Risk  (05/09/2023)   SDOH Interventions:     Readmission Risk Interventions     No data to display

## 2023-05-15 NOTE — NC FL2 (Signed)
 [place]  MEDICAID FL2 LEVEL OF CARE FORM     IDENTIFICATION  Patient Name: Cory [name] Birthdate: [birth year] Sex: male Admission Date (Current Location): 05/09/2023  [facility] and [place] Number:  Producer, Television/film/video and Address:  [facility], [address] [place], [place] [address]      Provider Number: [id number]  Attending Physician Name and Address:  Royal Sill, MD  Relative Name and Phone Number:  [name] Curl; Niece; [phone]    Current Level of Care: Hospital Recommended Level of Care: Skilled Nursing Facility Prior Approval Number:    Date Approved/Denied:   PASRR Number: [id number]  Discharge Plan: SNF    Current Diagnoses: Patient Active Problem List   Diagnosis Date Noted   AMS (altered mental status) 05/09/2023   Syncope 02/16/2023   Atrial flutter with rapid ventricular response (HCC) 12/27/2022   UTI (urinary tract infection) 12/27/2022   Acute metabolic encephalopathy 12/27/2022   Elevated troponin 12/27/2022   Elevated lipase 12/27/2022   Generalized weakness 12/27/2022   Poorly controlled type 2 diabetes mellitus (HCC) 12/26/2022   AKI (acute kidney injury) (HCC) 12/26/2022   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/02/2021   Moderate protein-calorie malnutrition (HCC)    Septic shock (HCC) 11/27/2020   Overweight (BMI 25.0-29.9) 11/27/2020   Hyperlipidemia 11/26/2020   Hyperglycemia 11/26/2020   Hypertension 11/26/2020    Orientation RESPIRATION BLADDER Height & Weight     Self  Normal (Room Air) Incontinent, External catheter Weight:  (Unable to weigh d/t bed malfunction) Height:  5' 11 (180.3 cm)  BEHAVIORAL SYMPTOMS/MOOD NEUROLOGICAL BOWEL NUTRITION STATUS      Incontinent Diet (Please see dc summary)  AMBULATORY STATUS COMMUNICATION OF NEEDS Skin   Extensive Assist Verbally Other (Comment) (Wound / Incision (Open or Dehisced) 05/09/23 Skin tear Elbow Left;Posterior; Wound /  Incision (Open or Dehisced) 05/09/23 Skin tear Elbow Left;Anterior; Wound / Incision (Open or Dehisced) 05/09/23 Skin tear Elbow Left;Distal)                       Personal Care Assistance Level of Assistance  Bathing, Dressing, Feeding Bathing Assistance: Maximum assistance Feeding assistance: Maximum assistance Dressing Assistance: Maximum assistance     Functional Limitations Info  Sight Sight Info: Impaired (R and L (Eyeglasses))        SPECIAL CARE FACTORS FREQUENCY  PT (By licensed PT), OT (By licensed OT)     PT Frequency: 5x OT Frequency: 5x            Contractures Contractures Info: Not present    Additional Factors Info  Code Status, Allergies, Isolation Precautions Code Status Info: Full Code Allergies Info: NKA     Isolation Precautions Info: Air/Contact Precautions (ESBL)     Current Medications (05/15/2023):  This is the current hospital active medication list Current Facility-Administered Medications  Medication Dose Route Frequency Provider Last Rate Last Admin   0.45 % sodium chloride  infusion   Intravenous Continuous Samtani, Jai-Gurmukh, MD 50 mL/hr at 05/15/23 1600 Infusion Verify at 05/15/23 1600   carvedilol  (COREG ) tablet 12.5 mg  12.5 mg Oral BID WC Agarwala, Fredia, MD   12.5 mg at 05/15/23 9257   Chlorhexidine  Gluconate Cloth 2 % PADS 6 each  6 each Topical Daily [name], MD   6 each at 05/15/23 1038   docusate sodium  (COLACE) capsule 100 mg  100 mg Oral BID [name], [name], DO   100 mg at 05/14/23 [phone]  heparin  ADULT infusion 100 units/mL (25000 units/250mL)  900 Units/hr Intravenous Continuous Katsaros, Lindsey R, [name] 9 mL/hr at 05/15/23 1600 900 Units/hr at 05/15/23 1600   insulin  aspart (novoLOG ) injection 0-5 Units  0-5 Units Subcutaneous QHS Agarwala, Ravi, MD   4 Units at 05/14/23 1925   insulin  aspart (novoLOG ) injection 0-9 Units  0-9 Units Subcutaneous TID WC Agarwala, Ravi, MD   2 Units at 05/15/23 1140   insulin   glargine-yfgn (SEMGLEE ) injection 10 Units  10 Units Subcutaneous BID Samtani, Jai-Gurmukh, MD   10 Units at 05/15/23 1454   multivitamin with minerals tablet 1 tablet  1 tablet Oral Daily Agarwala, Ravi, MD       Oral care mouth rinse  15 mL Mouth Rinse PRN [name], MD       pantoprazole  (PROTONIX ) EC tablet 40 mg  40 mg Oral BID Samtani, Jai-Gurmukh, MD       polyethylene glycol (MIRALAX  / GLYCOLAX ) packet 17 g  17 g Oral Daily [name], [name], DO   17 g at 05/14/23 9053   senna (SENOKOT) tablet 17.2 mg  2 tablet Oral QHS [name], [name], DO       tamsulosin  (FLOMAX ) capsule 0.4 mg  0.4 mg Oral Daily Samtani, Jai-Gurmukh, MD   0.4 mg at 05/15/23 1302     Discharge Medications: Please see discharge summary for a list of discharge medications.  Relevant Imaging Results:  Relevant Lab Results:   Additional Information SS# [id number]  Lauraine FORBES Saa, LCSW

## 2023-05-15 NOTE — Progress Notes (Addendum)
 PHARMACY - ANTICOAGULATION CONSULT NOTE  Pharmacy Consult for heparin  Indication: atrial fibrillation  No Known Allergies  Patient Measurements: Height: 5' 11 (180.3 cm) Weight:  (Unable to weigh d/t bed malfunction) IBW/kg (Calculated) : 75.3 Heparin  Dosing Weight: 88.5 kg  Vital Signs: Temp: 98.1 F (36.7 C) (01/06 1146) Temp Source: Oral (01/06 1146) BP: 147/84 (01/06 1200) Pulse Rate: 84 (01/06 1200)  Labs: Recent Labs    05/13/23 0014 05/13/23 0444 05/13/23 0444 05/14/23 0505 05/14/23 2227 05/15/23 0643  HGB  --  7.9*   < > 8.2* 8.3* 8.1*  HCT  --  24.7*   < > 26.3* 26.7* 26.0*  PLT  --  140*   < > 199 224 237  APTT  --   --   --   --  24  --   LABPROT  --   --   --   --  16.7*  --   INR  --   --   --   --  1.3*  --   HEPARINUNFRC 0.43  --   --   --   --   --   CREATININE  --  4.25*  --  3.58*  --  3.07*   < > = values in this interval not displayed.    Estimated Creatinine Clearance: 24 mL/min (A) (by C-G formula based on SCr of 3.07 mg/dL (H)).  Assessment: 77 YOM found unresponsive and seizing for unknown time, at least 2 days. Patient on Eliquis  PTA, anticoagulation held on admission. Pharmacy consulted for heparin  dosing.   1/6: Heparin  was discontinued on 1/4 and patient was transitioned back to Eliquis . On 1/5 PM, MD notified of patient having maroon stools. Eliquis  was held 1/5 after 0800 dose, transitioning patient back to heparin  per MD request. Due to recent Eliquis  dose, will monitor heparin  levels and aPTT until levels correlate. Heparin  previously therapeutic on 900 units/hr. Hgb improved to 8.1, PLT 237. No further maroon stools noted today per RN.  Goal of Therapy:  Heparin  level 0.3-0.5 units/ml - lower goal range due to increase bleeding risk aPTT 66-85 seconds Monitor platelets by anticoagulation protocol: Yes   Plan:  Restart heparin  at 900 units/hr - no bolus due to bleeding risk Check heparin  level and aPTT in 8 hours Monitor heparin   level and aPTT daily until levels correlate Monitor CBC, and s/sx of bleeding daily  Morna Breach, PharmD PGY-1 Acute Care Pharmacy Resident 05/15/2023 12:41 PM

## 2023-05-15 NOTE — Progress Notes (Signed)
 Nutrition Follow-up  DOCUMENTATION CODES:  Not applicable  INTERVENTION:  Liberalize to regular diet to encourage PO intake and prevent repeat CBGs Encourage PO intake Feeding assistance Add preferences to dining software Ensure Enlive po TID, each supplement provides 350 kcal and 20 grams of protein. MVI with minerals daily  NUTRITION DIAGNOSIS:  Inadequate oral intake related to inability to eat as evidenced by NPO status. - remains applicable  GOAL:  Patient will meet greater than or equal to 90% of their needs - progressing, extubated, diet in place  MONITOR:  PO intake, Supplement acceptance, I & O's, Labs, Weight trends  REASON FOR ASSESSMENT:  Consult Enteral/tube feeding initiation and management  ASSESSMENT:  Pt with hx of DM type 2, HTN, HLD, and R BKA presented to ED from home. Family had not heard from him in several days. First responders found pt wedged between bed and wall actively seizing. Intubated on arrival to ED.   12/31 - presented to ED, intubated 1/4 - extubated  Pt resting in bedside chair at the time of assessment. Niece and RN at bedside. RN assisted pt with eating this AM and consumed eggs, fruit, and juice. Pt agreeable to receiving ensure. Pt did have episode of low CBG this AM, will adjust diet to allow for maximum choices. RN reports that pt also doe not eat red meat, was sent bacon on his tray this AM. Will add preferences into dining software. Also added ordering assistance.   Discussed in rounds, RN reports that pt was scheduled for an elective colon resection surgery on Thursday which will now be delayed.   Average Meal Intake: 1/5: 25% average intake x 3 recorded meals  Admit weight: 85 kg  Current weight: 91.1 kg  ~4% weight loss noted in the last 3 months, not severe   Intake/Output Summary (Last 24 hours) at 05/16/2023 1139 Last data filed at 05/16/2023 1039 Gross per 24 hour  Intake 1878.46 ml  Output 2701 ml  Net -822.54 ml  Net  IO Since Admission: 11,091.22 mL [05/16/23 1139]  Drains/Lines: UOP x 24 hours  Nutritionally Relevant Medications: Scheduled Meds:  docusate sodium   100 mg Oral BID   insulin  aspart  0-5 Units Subcutaneous QHS   insulin  aspart  0-9 Units Subcutaneous TID WC   multivitamin with minerals  1 tablet Oral Daily   pantoprazole  IV  40 mg Intravenous Q12H   polyethylene glycol  17 g Oral Daily   senna  2 tablet Oral QHS   Labs Reviewed: BUN 67, creatinine 4.9 Mg 2.5 CBG ranges from 90-191 mg/dL over the last 24 hours HgbA1c 14% (12/27/22)  NUTRITION - FOCUSED PHYSICAL EXAM: Flowsheet Row Most Recent Value  Orbital Region No depletion  Upper Arm Region No depletion  Thoracic and Lumbar Region No depletion  Buccal Region No depletion  Temple Region No depletion  Clavicle Bone Region No depletion  Clavicle and Acromion Bone Region No depletion  Scapular Bone Region No depletion  Dorsal Hand Unable to assess  [mittens]  Patellar Region No depletion  Anterior Thigh Region No depletion  Posterior Calf Region Mild depletion  Edema (RD Assessment) Mild  Hair Reviewed  Eyes Reviewed  Mouth Reviewed  Skin Reviewed  Nails Unable to assess  [mittens]   Diet Order:   Diet Order             Diet regular Room service appropriate? Yes with Assist; Fluid consistency: Thin  Diet effective now  EDUCATION NEEDS:  Education needs have been addressed  Skin:  Skin Assessment: Reviewed RN Assessment Skin tear: left elbow x 3  - Posterior - (2x1 cm) - Anterior - (2x1 cm) - Distal - (4x1 cm)  Last BM:  1/6 - type 4  Height:  Ht Readings from Last 1 Encounters:  05/09/23 5' 11 (1.803 m)    Weight:  Wt Readings from Last 1 Encounters:  05/13/23 94.1 kg    Ideal Body Weight:  73.6 kg (adjusted by 5.9% for BKA)  BMI:  Body mass index is 28.93 kg/m.  Estimated Nutritional Needs:  Kcal:  2100-2300 kcal/d Protein:  105-120g/d Fluid:   2.1-2.3L/d    Cory [name], RD, LDN Registered Dietitian II Please reach out via secure chat Weekend on-call pager # available in [facility]

## 2023-05-15 NOTE — Consult Note (Addendum)
 Consultation  Referring Provider:   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   Of note patient was seen recently at [facility] by general surgery Cory [name] on 12/11 for new patient consult for splenic flexure colon cancer found by Cory [name] at [place] found during evaluation for IDA.   Colonoscopy had poor prep and incomplete evaluation, EGD mild inflammation of gastric antrum polyp in gastric body.  03/22/2023 CEA 10.5, 04/20/2023 CEA 21 04/19/2023 PET scan hypermetabolic proximal descending colon mass no evidence of local regional or distant metastasis Patient was scheduled for laparoscopic assisted left colectomy with lymph node biopsies 1/25 at [facility].  However patient was found in their home unresponsive with active seizures 12/31, brought to the ER intubated significantly elevated sugars COVID-positive, UTI started on broad-spectrum antibiotics.  Patient extubated 1/4, MRI brain with chronic CVA, seizure suspected secondary to hyperglycemia.  No acute abnormality. Patient had DKA/rhabdomyolysis treated aggressively. Patient initially on heparin  drip for A-fib with RVR, patient transitioned back to Eliquis  1/4 in the afternoon, had 1 more dose 1/5 in the morning however physician noted maroon stools and Eliquis  was held.  Patient was transitioned back to heparin  drip for his atrial fibrillation  GI consulted secondary to maroon stools in setting of Eliquis /heparin  with known proximal descending colon mass. Hemoglobin relatively stable was 9.8 on 1/1  currently 8.3, with levels being between 9 and 11.  Patient has not needed any blood this admission, initially had thrombocytopenia currently normal, no leukocytosis FOBT positive INR 1.3 BUN 49, creatinine 3.07  Patient with family at bedside, a niece Cory [name]. Provided some of the history.  Cory [name] will help her uncle out as well as many other nieces and nephews.  Patient currently extubated, states states November 2024, states he is in a bed in a hospital uncertain which 1.  Has some difficulty recognizing nieces and nephews.  He is not having bowel movements at home occasionally with urge to go but be unable to, she has not noticed any melena. Patient and niece state he has had occasional bright red blood on the toilet paper and in toilet and per notes from general surgery [facility] has had some bright red blood on the toilet paper. Denies any nausea, vomiting, abdominal pain.'s passing gas no signs of obstruction. Has had some weight loss about 10 to 15 pounds over the last 3 months. Denies any dizziness, chest pain or shortness of breath.  Patient had brown-red type VI [place] stool to 05/13/18/2025, per nurse notes patient had bowel movement this morning at 530 brown small-volume from the rectum, no hematochezia.  He    Past Medical History:  Diagnosis Date  . Diabetes mellitus without complication (HCC)   . Hyperlipidemia   . Hypertension   . 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 Sepsis (HCC) 11/27/2020    Surgical History:  He  has a past surgical history that includes Appendectomy; Amputation (Right, 12/04/2020); and Application if wound vac (Right, 12/04/2020). Family History:  His family history includes Diabetes in his father and mother. Social History:   reports that he has never smoked. He has never used smokeless tobacco. He reports that he does not drink alcohol and does not use drugs.  Prior to Admission medications   Medication Sig Start Date End Date Taking?  Authorizing Provider  acetaminophen  (TYLENOL ) 325 MG tablet Take 2 tablets (650 mg total) by mouth every 6 (six) hours as needed for mild pain, headache or fever (or Fever >/= 101). 12/07/20   Cory [name], Cory T, MD  apixaban  (ELIQUIS ) 5 MG TABS tablet Take 1 tablet (5 mg total) by mouth 2 (two) times daily. 12/30/22   Cory [name], Cory HERO, MD  atorvastatin  (LIPITOR) 20 MG tablet Take 20 mg by mouth at bedtime. 09/14/20   [provider]  carvedilol  (COREG ) 25 MG tablet Take 25 mg by mouth 2 (two) times daily with a meal. 03/16/23   [provider]  insulin  lispro (HUMALOG  KWIKPEN) 100 UNIT/ML KwikPen Inject 2-10 Units into the skin daily as needed (BS > 200 per).    [provider]  Insulin  Pen Needle (B-D UF III MINI PEN NEEDLES) 31G X 5 MM MISC 1 (one) Each daily 11/02/20     LANTUS  SOLOSTAR 100 UNIT/ML Solostar Pen Inject 26 Units into the skin daily. 02/19/23   Cory [name], Cory HERO, MD  metoprolol  tartrate (LOPRESSOR ) 50 MG tablet Take 1 tablet (50 mg total) by mouth 2 (two) times daily. 12/30/22   Cory [name], Cory HERO, MD  neomycin  (MYCIFRADIN ) 500 MG tablet Take 1,000 mg by mouth in the morning and at bedtime. 05/08/23 05/18/23  [provider]  nystatin (MYCOSTATIN) 100000 UNIT/ML suspension Take 5 mLs by mouth 4 (four) times daily. 04/27/23   [provider]  potassium chloride  (KLOR-CON  M) 10 MEQ tablet Take 2 tablets (20 mEq total) by mouth daily. 02/19/23   Cory [name], Cory HERO, MD  potassium chloride  (KLOR-CON ) 10 MEQ tablet Take 20 mEq by mouth daily. 04/16/23   [provider]  sertraline  (ZOLOFT ) 50 MG tablet Take 50 mg by mouth daily. 07/18/22   [provider]  tamsulosin  (FLOMAX ) 0.4 MG CAPS capsule Take 1 capsule (0.4 mg total) by mouth daily. 12/07/20   Cory [name], Cory T, MD  torsemide  (DEMADEX ) 20 MG tablet Take 20 mg by mouth daily.    [provider]  Vitamin D , Ergocalciferol , (DRISDOL ) 1.25 MG (50000 UNIT) CAPS capsule Take 50,000  Units by mouth every Sunday. 11/18/22   [provider]  furosemide  (LASIX ) 20 MG tablet Take 1-2 tablets in the morning for your ankle and foot swelling. Patient not taking: No sig reported 11/12/20 11/27/20  Rolinda Rogue, MD    Current Facility-Administered Medications  Medication Dose Route Frequency Provider Last Rate Last Admin  . 0.45 % sodium chloride  infusion   Intravenous Continuous Samtani, Jai-Gurmukh, MD 50 mL/hr at 05/15/23 1258 New Bag at 05/15/23 1258  . carvedilol  (COREG ) tablet 12.5 mg  12.5 mg Oral BID WC Agarwala, Ravi, MD   12.5 mg at 05/15/23 9257  . Chlorhexidine  Gluconate Cloth 2 % PADS 6 each  6 each Topical Daily [name], MD   6 each at 05/15/23 1038  . docusate sodium  (COLACE) capsule 100 mg  100 mg Oral BID [name], [name], DO   100 mg at 05/14/23 0947  . heparin  ADULT infusion 100 units/mL (25000 units/250mL)  900 Units/hr Intravenous Continuous [name], [name] 9 mL/hr at 05/15/23 1301 900 Units/hr at 05/15/23 1301  .  insulin  aspart (novoLOG ) injection 0-5 Units  0-5 Units Subcutaneous QHS Agarwala, Ravi, MD   4 Units at 05/14/23 1925  . insulin  aspart (novoLOG ) injection 0-9 Units  0-9 Units Subcutaneous TID WC Agarwala, Ravi, MD   2 Units at 05/15/23 1140  . insulin  glargine-yfgn (SEMGLEE ) injection 10 Units  10 Units Subcutaneous BID Samtani, Jai-Gurmukh, MD      . multivitamin with minerals tablet 1 tablet  1 tablet Oral Daily Agarwala, Fredia, MD      . Oral care mouth rinse  15 mL Mouth Rinse PRN [name], MD      . pantoprazole  (PROTONIX ) EC tablet 40 mg  40 mg Oral BID Samtani, Jai-Gurmukh, MD      . polyethylene glycol (MIRALAX  / GLYCOLAX ) packet 17 g  17 g Oral Daily [name], [name], DO   17 g at 05/14/23 0946  . senna (SENOKOT) tablet 17.2 mg  2 tablet Oral QHS [name], [name], DO      . tamsulosin  (FLOMAX ) capsule 0.4 mg  0.4 mg Oral Daily Samtani, Jai-Gurmukh, MD   0.4 mg at 05/15/23 1302    Allergies as of 05/09/2023  . (No  Known Allergies)    Review of Systems:    Constitutional: No weight loss, fever, chills, weakness or fatigue HEENT: Eyes: No change in vision               Ears, Nose, Throat:  No change in hearing or congestion Skin: No rash or itching Cardiovascular: No chest pain, chest pressure or palpitations   Respiratory: No SOB or cough Gastrointestinal: See HPI and otherwise negative Genitourinary: No dysuria or change in urinary frequency Neurological: No headache, dizziness or syncope Musculoskeletal: No new muscle or joint pain Hematologic: No bleeding or bruising Psychiatric: No history of depression or anxiety     Physical Exam:  Vital signs in last 24 hours: Temp:  [97.8 F (36.6 C)-98.8 F (37.1 C)] 98.1 F (36.7 C) (01/06 1146) Pulse Rate:  [74-132] 84 (01/06 1200) Resp:  [12-24] 20 (01/06 1200) BP: (128-188)/(75-125) 147/84 (01/06 1200) SpO2:  [71 %-100 %] 99 % (01/06 1200) Last BM Date : 05/14/23 Last BM recorded by nurses in past 5 days Stool Type: Type 6 (Mushy consistency with ragged edges) (05/15/2023  5:30 AM)  General:   Chronically ill-appearing, male in no acute distress Head:  Normocephalic and atraumatic. Eyes: sclerae anicteric,conjunctive pink  Heart:  regular rate and rhythm Pulm: Clear anteriorly; no wheezing Abdomen:  Soft, Obese AB, Active bowel sounds. No tenderness . Without guarding and Without rebound, No organomegaly appreciated. Extremities: Right BKA, left without pitting edema Neurologic:  Alert and  oriented x1, unable to tell me location or date;  No focal deficits.  Skin:   Dry and intact without significant lesions or rashes. Psychiatric:  Cooperative. Normal mood and affect.  LAB RESULTS: Recent Labs    05/14/23 0505 05/14/23 2227 05/15/23 0643  WBC 7.6 6.1 5.7  HGB 8.2* 8.3* 8.1*  HCT 26.3* 26.7* 26.0*  PLT 199 224 237   BMET Recent Labs    05/13/23 0444 05/14/23 0505 05/15/23 0643  NA 149* 151* 145  K 3.5 3.7 4.2  CL 112*  113* 110  CO2 26 27 24   GLUCOSE 135* 85 217*  BUN 66* 53* 49*  CREATININE 4.25* 3.58* 3.07*  CALCIUM  8.3* 8.3* 8.2*   LFT No results for input(s): PROT, ALBUMIN , AST, ALT, ALKPHOS, BILITOT, BILIDIR, IBILI in the last 72 hours. PT/INR Recent Labs  05/14/23 2227  LABPROT 16.7*  INR 1.3*    STUDIES: No results found.    Impression    Rectal bleeding in patient with no proximal descending colon mass, with anemia 04/20/2023 CEA 21 Planning laparoscopic assisted left colectomy with lymph node biopsies at [facility] 1/25 05/15/2023  HGB 8.1 baseline between 10 and 12, stable at this time without any need for red blood cells Recent Labs    05/09/23 1049 05/09/23 1126 05/10/23 0348 05/10/23 1828 05/11/23 0332 05/12/23 0506 05/13/23 0444 05/14/23 0505 05/14/23 2227 05/15/23 0643  HGB 12.6* 10.9* 9.8* 8.8* 8.9* 8.1* 7.9* 8.2* 8.3* 8.1*  Bleeding likely secondary to known proximal ascending colon cancer, had 1 episode with no further GI bleeding No plan for endoscopic evaluation at this point unless patient has significant life-threatening GI bleed, bleeding secondary to known malignancy does poorly with therapeutic endoscopic interventions Suggest continuing supportive care for comorbid conditions of outpatient and as well as monitoring hemoglobin hematocrit transfusing be greater than 7 Consider IV iron  Continue follow-up with general surgery at [facility] No signs of obstruction at this time  A-fib/flutter Eliquis  currently on hold, last dose 1/5 in the morning Currently on heparin , continue  Microcytic anemia Hemoglobin 8.3, previously 9.8-->5 days ago baseline appears to be between 10-12 Likely secondary to known descending colon malignancy with rectal bleeding Continue supportive care, monitor H&H transfuse to keep greater than 7. Consider IV iron   Septic shock/seizures/AMS Initially intubated, extubated 1/4  Continues with some altered mental  status/encephalopathy  Positive COVID-19 +, UTI Received treatments Continue to monitor for Continue to monitor  moderate protein calorie malnutrition Albumin  05/12/2023  1.8  BMI body mass index is 28.93 kg/m.  Secondary to colon cancer - RD consult Count calories, increase protein  DM2 with DKA and seizure suspect related hypoglycemia On insulin , improving  BKA 2022  secondary osteomyelitis  Principal Problem:   AMS (altered mental status) Active Problems:   Septic shock (HCC)    LOS: 6 days     Alan JONELLE Coombs  05/15/2023, 1:18 PM   Attending physician's note  I have taken a history, reviewed the chart and examined the patient. I performed a substantive portion of this encounter, including complete performance of at least one of the key components, in conjunction with the APP. I agree with the APP's note, impression and recommendations.    Known left colon (Descending colon) adeno ca, recently diagnosed with persistent blood in stool. Hgb has not dropped significantly compared to his baseline. He was brought in after he was found unresponsive, sepsis, covid +, encephalopathy On Heparin  gtt for Afib  Blood in stool/rectal bleeding secondary to known left colon mass Hgb is relatively stable, hasn'[name] required any PRBC transfusion during this admission Will defer anticoagulation therapy/ management to Primary team, is at high thrombotic risk No role for repeat colonoscopy at this point  He will need surgical resection of the mass once he is clinically improved to undergo surgery.  GI will sign off, available if have any questions  The patient 's family at bedside was provided an opportunity to ask questions and all were answered. They agreed with the plan and demonstrated an understanding of the instructions.  LOIS Wilkie Mcgee , MD [phone]

## 2023-05-16 DIAGNOSIS — R404 Transient alteration of awareness: Secondary | ICD-10-CM | POA: Diagnosis not present

## 2023-05-16 LAB — BASIC METABOLIC PANEL
Anion gap: 9 (ref 5–15)
BUN: 37 mg/dL — ABNORMAL HIGH (ref 8–23)
CO2: 26 mmol/L (ref 22–32)
Calcium: 8.1 mg/dL — ABNORMAL LOW (ref 8.9–10.3)
Chloride: 110 mmol/L (ref 98–111)
Creatinine, Ser: 2.67 mg/dL — ABNORMAL HIGH (ref 0.61–1.24)
GFR, Estimated: 24 mL/min — ABNORMAL LOW (ref >60)
Glucose, Bld: 51 mg/dL — ABNORMAL LOW (ref 70–99)
Potassium: 3.5 mmol/L (ref 3.5–5.1)
Sodium: 145 mmol/L (ref 135–145)

## 2023-05-16 LAB — CBC WITH DIFFERENTIAL/PLATELET
Abs Immature Granulocytes: 0.03 10*3/uL (ref 0.00–0.07)
Basophils Absolute: 0 10*3/uL (ref 0.0–0.1)
Basophils Relative: 0 %
Eosinophils Absolute: 0.2 10*3/uL (ref 0.0–0.5)
Eosinophils Relative: 4 %
HCT: 25.2 % — ABNORMAL LOW (ref 39.0–52.0)
Hemoglobin: 7.9 g/dL — ABNORMAL LOW (ref 13.0–17.0)
Immature Granulocytes: 1 %
Lymphocytes Relative: 36 %
Lymphs Abs: 1.8 10*3/uL (ref 0.7–4.0)
MCH: 23.7 pg — ABNORMAL LOW (ref 26.0–34.0)
MCHC: 31.3 g/dL (ref 30.0–36.0)
MCV: 75.7 fL — ABNORMAL LOW (ref 80.0–100.0)
Monocytes Absolute: 0.6 10*3/uL (ref 0.1–1.0)
Monocytes Relative: 13 %
Neutro Abs: 2.4 10*3/uL (ref 1.7–7.7)
Neutrophils Relative %: 46 %
Platelets: 269 10*3/uL (ref 150–400)
RBC: 3.33 MIL/uL — ABNORMAL LOW (ref 4.22–5.81)
RDW: 15.9 % — ABNORMAL HIGH (ref 11.5–15.5)
WBC: 5.1 10*3/uL (ref 4.0–10.5)
nRBC: 0.6 % — ABNORMAL HIGH (ref 0.0–0.2)

## 2023-05-16 LAB — GLUCOSE, CAPILLARY
Glucose-Capillary: 42 mg/dL — CL (ref 70–99)
Glucose-Capillary: 47 mg/dL — ABNORMAL LOW (ref 70–99)
Glucose-Capillary: 59 mg/dL — ABNORMAL LOW (ref 70–99)
Glucose-Capillary: 73 mg/dL (ref 70–99)
Glucose-Capillary: 148 mg/dL — ABNORMAL HIGH (ref 70–99)
Glucose-Capillary: 225 mg/dL — ABNORMAL HIGH (ref 70–99)
Glucose-Capillary: 210 mg/dL — ABNORMAL HIGH (ref 70–99)

## 2023-05-16 LAB — APTT
aPTT: 55 s — ABNORMAL HIGH (ref 24–36)
aPTT: 51 s — ABNORMAL HIGH (ref 24–36)

## 2023-05-16 LAB — HEPARIN LEVEL (UNFRACTIONATED): Heparin Unfractionated: 0.62 [IU]/mL (ref 0.30–0.70)

## 2023-05-16 MED ORDER — HEPARIN (PORCINE) 25000 UT/250ML-% IV SOLN
1350.0000 [IU]/h | INTRAVENOUS | Status: DC
Start: 2023-05-16 — End: 2023-05-18
  Administered 2023-05-17: 1250 [IU]/h via INTRAVENOUS
  Filled 2023-05-16 (×2): qty 250

## 2023-05-16 MED ORDER — APIXABAN 5 MG PO TABS: 5.0000 mg | ORAL_TABLET | Freq: Two times a day (BID) | ORAL | Status: DC

## 2023-05-16 MED ORDER — SODIUM CHLORIDE 0.45 % IV SOLN: INTRAVENOUS | Status: AC

## 2023-05-16 MED ORDER — CARVEDILOL 25 MG PO TABS
25.0000 mg | ORAL_TABLET | Freq: Two times a day (BID) | ORAL | Status: DC
  Administered 2023-05-16 – 2023-05-22 (×12): 25 mg via ORAL
  Filled 2023-05-16 (×12): qty 1

## 2023-05-16 MED ORDER — ENSURE ENLIVE PO LIQD
237.0000 mL | Freq: Three times a day (TID) | ORAL | Status: DC
  Administered 2023-05-16 – 2023-05-22 (×15): 237 mL via ORAL

## 2023-05-16 NOTE — Progress Notes (Signed)
 [facility]   NOTE Cory [name] FMW:[id number]  DOB: [birth year]  DOA: 05/09/2023  PCP: Cory [name] Cory [name], MD  05/16/2023,11:29 AM   LOS: 7 days      Code Status: Full code From: Home  current Dispo: Unclear     78 year old black male known history of right BKA 11/2020 HTN HLD DM TY 2 Right BKA with prosthesis Had a h/o colonoscopy in 10//2024--is supposed to have colon surgery for possible end-end ileostomy Atrial flutter/HFpEF CHADVASC >3 on Eliquis  diagnosed 12/2022-last echo EF 60-65% valves normal Previous admission 02/16/2023-02/19/2023 for presyncope transient left arm heaviness--also previously admitted for AKI and admitted with non-ketotic state  12/31 brought to emergency room from home-was found seizing GCS 3 right gaze preference-glucose 700 lactic acid 2.0 procalcitonin 4.5-potassium 2.8 intubated in ED Felt initially to have altered mental status with low probability for meningitis but was on antibiotics and antivirals CK was 11,000 from admission Creatinine up from 1-4.4  Events 12/31 admitted seizing intubated DKA protocol 1/3 mental status not improving-repeat EEG severe to profound diffuse encephalopathy 1/4 extubated  procedures 12/31 EEG cortical dysfunction bifrontal region 2/2 underlying structural abnormality--- COVID positive-Art line right IJ Foley placed   Plan  Toxic metabolic encephalopathy on admission unclear etiology felt initially to be may be meningitis Meningitis entertained but not confirmed-BCx 212/31 no growth-mentation has improved we have alternate diagnoses of probable hyperglycemia/.AKI  causing the confusion See below Hyperosmolar nonketotic state on admission--DM2--(Home med Lantus  26 units lispro to 10 units >CBG 200- some hypoglycemia during hospitalization Blood sugars were in the 700s patient probably had seizures from hyperglycemia Hyperglycemia persists --hold long acting--continue TID SSI, hold long acting units Seizures on  admission Probably secondary to hyperglycemia EEG as above-neurology last saw on 1/4 and signed off because more appropriate and less confused--no need AED's per them COVID-positive and ESBL E. coli  only 20,000 growing in urine culture Received remdesivir  for 3 days until 1/3+ steroids---Is on room air so think that this was all rather coincidental  asymptomatic bacteriuria finished ceftriaxone  1/4  A-flutter CHADVASC >3 on Eliquis  Resumed Coreg  12.5 twice daily--Keep on monitors Having rectal bleed-continues on cautious heparin  Colon cancer not operated on yet with some bleeding this hospital stay Diagnosed 02/2023--POC discussed with GI/Gen surg--has continued maroon stool mixed with blood-cannot transition until we have a clear plan about surgery Reconsulted general surgery to discuss options and will need heparin  going forward given high risk of VTE with active cancer AKI superimposed on CKD 3B --nl creatinine per niece about 4 possibly secondary to her rhabdomyolysis on admission/severe hypokalemia Hypernatremia during hospitalization Patient retaining about 240 cc --creatinine continues to improve Needed eventual foley as retaining 1/6--continue Flomax  0.4 daily Microscopic hematuria Needs outpatient follow-up   Discussed with the patient's niece Cory [name] in person--her # #[phone]  DVT prophylaxis: Eliquis   Status is: Inpatient Remains inpatient appropriate because:   Requires further improvement    Subjective:  A little bit more alert-he knows the time and the date He remembers Trump as being contender for the president cannot tell me the President He Was Cory [name] Assist Nursing at the Bedside Reports Losing Stool from Rectum Which Was Maroon like   Objective + exam Vitals:   05/16/23 0815 05/16/23 0900 05/16/23 1000 05/16/23 1042  BP:  (!) 153/111  (!) 150/104  Pulse: 73 (!) 101 88   Resp:  15 10   Temp:      TempSrc:      SpO2: 96% 97% 100%  Weight:      Height:        Filed Weights   05/10/23 0500 05/11/23 0406 05/13/23 0436  Weight: 85 kg 91.1 kg 94.1 kg    Examination:  Awake in better spirits a little less confused no fever no chills S1-S2 no murmur ROM intact CTAB no added sound Does have access in left neck Abdomen obese nontender  Data Reviewed: reviewed   CBC    Component Value Date/Time   WBC 5.1 05/16/2023 0659   RBC 3.33 (L) 05/16/2023 0659   HGB 7.9 (L) 05/16/2023 0659   HCT 25.2 (L) 05/16/2023 0659   PLT 269 05/16/2023 0659   MCV 75.7 (L) 05/16/2023 0659   MCH 23.7 (L) 05/16/2023 0659   MCHC 31.3 05/16/2023 0659   RDW 15.9 (H) 05/16/2023 0659   LYMPHSABS 1.8 05/16/2023 0659   MONOABS 0.6 05/16/2023 0659   EOSABS 0.2 05/16/2023 0659   BASOSABS 0.0 05/16/2023 0659      Latest Ref Rng & Units 05/16/2023    6:59 AM 05/15/2023    6:43 AM 05/14/2023    5:05 AM  CMP  Glucose 70 - 99 mg/dL 51  782  85   BUN 8 - 23 mg/dL 37  49  53   Creatinine 0.61 - 1.24 mg/dL 7.32  6.92  6.41   Sodium 135 - 145 mmol/L 145  145  151   Potassium 3.5 - 5.1 mmol/L 3.5  4.2  3.7   Chloride 98 - 111 mmol/L 110  110  113   CO2 22 - 32 mmol/L 26  24  27    Calcium  8.9 - 10.3 mg/dL 8.1  8.2  8.3     Scheduled Meds:  apixaban   5 mg Oral BID   carvedilol   12.5 mg Oral BID WC   Chlorhexidine  Gluconate Cloth  6 each Topical Daily   docusate sodium   100 mg Oral BID   insulin  aspart  0-5 Units Subcutaneous QHS   insulin  aspart  0-9 Units Subcutaneous TID WC   insulin  glargine-yfgn  10 Units Subcutaneous BID   multivitamin with minerals  1 tablet Oral Daily   pantoprazole   40 mg Oral BID   polyethylene glycol  17 g Oral Daily   senna  2 tablet Oral QHS   tamsulosin   0.4 mg Oral Daily   Continuous Infusions:  sodium chloride  50 mL/hr at 05/16/23 1039    Time 50  Cory Grimes, MD  [facility]

## 2023-05-16 NOTE — Progress Notes (Signed)
 Physical Therapy Treatment Patient Details Name: Cory [name] MRN: [id number] DOB: July 23, 1945 Today's Date: 05/16/2023   History of Present Illness Pt is 78 year old presented to [facility] on  05/09/23 after found unresponsive and actively seizing at home. Intubated in ED. Pt found to be covid +. Extubated 1/4. 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 PMH - DM, HLD, HTN, Osteomyelitis, R BKA.    PT Comments  Tolerated treatment well, required up to mod assist +2 for bed mobility and transfer from bed with RW and RLE prosthesis. Oriented to place only at beginning of session, reoriented early and able to recall month/year at end of treatment. HR tachy to 130s after transfer to chair but returned to low 100s with rest. Min assist +2 for short distance gait with RW, multimodal cues for sequencing, walker control, and balance. Patient will continue to benefit from skilled physical therapy services to further improve independence with functional mobility.    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 Pulls through LUE holding therapist's hand to rise.    Transfers Overall transfer level: Needs assistance Equipment used: Rolling walker (2 wheels) Transfers: Sit to/from Stand, Bed to chair/wheelchair/BSC Sit to Stand: Mod assist, +2 physical assistance, +2 safety/equipment   Step pivot transfers: Mod assist, +2 safety/equipment       General transfer comment: Mod A + 2 to stand at bedside with pt pulling on RW and truncal flexion noted. With cues for posture, assist for lines and step by step cues to manuever RW, pt able to take a few steps to recliner in room. Mod A + 2 for safety to stand from recliner pushing from armrest for ADL assist. Cues throughout for finding center of balance and utilizing RW appropriately for support.    Ambulation/Gait Ambulation/Gait assistance: Min assist, +2 physical assistance Gait Distance (Feet): 5 Feet Assistive device: Rolling walker (2 wheels) Gait Pattern/deviations: Step-to pattern, Step-through pattern, Decreased stride length, Knee flexed in stance - left, Knee flexed in stance - right, Trunk flexed Gait velocity: dec Gait velocity interpretation: <1.31 ft/sec, indicative of household ambulator   General Gait Details: Min assist +2 for balance and RW control, knee flexed throughout activity. Cues for upright posture and awareness of position within RW. Difficulty taking steps backwards to chair. Multimodal cues throughout.   Stairs             Wheelchair Mobility     Tilt Bed    Modified Rankin (Stroke Patients Only)       Balance Overall balance assessment: Needs assistance Sitting-balance support: Feet supported, Single extremity  supported Sitting balance-Leahy Scale: Poor Sitting balance - Comments: initially min assist for static sitting. Improved to CGA as well as UE support   Standing balance support:  Bilateral upper extremity supported, During functional activity, Reliant on assistive device for balance Standing balance-Leahy Scale: Poor                              Cognition Arousal: Alert Behavior During Therapy: Flat affect Overall Cognitive Status: Impaired/Different from baseline Area of Impairment: Orientation, Attention, Memory, Following commands, Safety/judgement, Awareness, Problem solving                 Orientation Level: Disoriented to, Situation, Place, Time Current Attention Level: Sustained, Selective Memory: Decreased short-term memory Following Commands: Follows one step commands inconsistently, Follows one step commands with increased time Safety/Judgement: Decreased awareness of safety, Decreased awareness of deficits Awareness: Intellectual Problem Solving: Slow processing, Decreased initiation, Difficulty sequencing, Requires verbal cues, Requires tactile cues General Comments: reported it was November and seemed shocked that it was January 7th. able to recall the month/date at end of session. inconsistent command following, answering some questions but not others. pleasant and participatory though still below cognitive baseline        Exercises      General Comments General comments (skin integrity, edema, etc.): HR to 137 with activity, SpO2 WFL on RA and BP elevated throughout (140s/110s)      Pertinent Vitals/Pain Pain Assessment Pain Assessment: No/denies pain    Home Living Family/patient expects to be discharged to:: Private residence Living Arrangements: Alone Available Help at Discharge: Family;Available PRN/intermittently Type of Home: Apartment Home Access: Level entry       Home Layout: One level Home Equipment: Rollator (4 wheels);Wheelchair - manual;Tub bench;Cane - single Librarian, Academic (2 wheels);Other (comment) (walking stick)      Prior Function            PT Goals (current goals can now be found in  the care plan section) Acute Rehab PT Goals Patient Stated Goal: not stated PT Goal Formulation: With patient Time For Goal Achievement: 05/28/23 Potential to Achieve Goals: Fair Progress towards PT goals: Progressing toward goals    Frequency    Min 1X/week      PT Plan      Co-evaluation PT/OT/SLP Co-Evaluation/Treatment: Yes Reason for Co-Treatment: For patient/therapist safety;To address functional/ADL transfers;Necessary to address cognition/behavior during functional activity PT goals addressed during session: Mobility/safety with mobility;Balance;Proper use of DME OT goals addressed during session: ADL's and self-care;Proper use of Adaptive equipment and DME      AM-PAC PT 6 Clicks Mobility   Outcome Measure  Help needed turning from your back to your side while in a flat bed without using bedrails?: A Little Help needed moving from lying on your back to sitting on the side of a flat bed without using bedrails?: A Lot Help needed moving to and from a bed to a chair (including a wheelchair)?: A Lot Help needed standing up from a chair using your arms (e.g., wheelchair or bedside chair)?: A Lot Help needed to walk in hospital room?: A Lot Help needed climbing 3-5 steps with a railing? : Total 6 Click Score: 12    End of Session Equipment Utilized During Treatment: Gait belt Activity Tolerance: Patient tolerated treatment well Patient left: with call bell/phone within reach;in chair;with chair alarm set;with restraints reapplied Nurse Communication: Mobility status PT Visit Diagnosis: Other abnormalities of gait and  mobility (R26.89);Muscle weakness (generalized) (M62.81);Unsteadiness on feet (R26.81);Other symptoms and signs involving the nervous system (R29.898)     Time: 9098-9064 PT Time Calculation (min) (ACUTE ONLY): 34 min  Charges:    $Therapeutic Activity: 8-22 mins PT General Charges $$ ACUTE PT VISIT: 1 Visit                     Leontine Roads,  PT, DPT [facility]  Rehabilitation Services Physical Therapist Office: [phone] Website: [email or web address]    Leontine GORMAN Roads 05/16/2023, 11:06 AM

## 2023-05-16 NOTE — TOC CM/SW Note (Signed)
 Transition of Care [facility]) - Inpatient Brief Assessment   Patient Details  Name: Cory [name] MRN: [id number] Date of Birth: January 13, 1946  Transition of Care [facility]) CM/SW Contact:    Lauraine FORBES Saa, LCSW Phone Number: 05/16/2023, 2:30 PM   Clinical Narrative:  2:30 PM Per patient's niece, Cory [name], request, CSW emailed current SNF offers and Medicare ratings ([facility], [facility], [facility], [facility]). CSW has to receive a response with decision.   Transition of Care Asessment: Insurance and Status: Insurance coverage has been reviewed Patient has primary care physician: Yes Home environment has been reviewed: Private Residence Prior level of function:: Independent/Modified Audiological Scientist Home Services: No current home services Social Drivers of Health Review:  (Patient unable to answer) Readmission risk has been reviewed: Yes Transition of care needs: transition of care needs identified, TOC will continue to follow

## 2023-05-16 NOTE — Progress Notes (Signed)
 PHARMACY - ANTICOAGULATION CONSULT NOTE  Pharmacy Consult for heparin  Indication: atrial fibrillation  No Known Allergies  Patient Measurements: Height: 5' 11 (180.3 cm) Weight:  (Unable to weigh d/t bed malfunction) IBW/kg (Calculated) : 75.3 Heparin  Dosing Weight: 88.5 kg  Vital Signs: Temp: 98.8 F (37.1 C) (01/07 0731) Temp Source: Axillary (01/07 0731) BP: 144/85 (01/07 0700) Pulse Rate: 142 (01/07 0800)  Labs: Recent Labs    05/14/23 0505 05/14/23 2227 05/15/23 0643 05/15/23 2114 05/16/23 0659  HGB 8.2* 8.3* 8.1*  --  7.9*  HCT 26.3* 26.7* 26.0*  --  25.2*  PLT 199 224 237  --  269  APTT  --  24  --  36 55*  LABPROT  --  16.7*  --   --   --   INR  --  1.3*  --   --   --   HEPARINUNFRC  --   --   --  0.72* 0.62  CREATININE 3.58*  --  3.07*  --  2.67*    Estimated Creatinine Clearance: 27.6 mL/min (A) (by C-G formula based on SCr of 2.67 mg/dL (H)).  Assessment: 77 YOM found unresponsive and seizing for unknown time, at least 2 days. Patient has a history of Afib on Eliquis  PTA.  He was transitioned to IV heparin , then back to Eliquis  on 1/4.  He then had maroon stools and Eliquis  was held on 1/5.  No surgery/endoscopic interventions unless patient has significant life-threatening bleeding per Surgery/GI.  Pharmacy consulted to restart IV heparin  on 1/6.  aPTT sub-therapeutic at 55 sec.  aPTT and heparin  level are not yet correlating.  CBC relatively stable.  RN reports maroon stools overnight.  Goal of Therapy:  Heparin  level 0.3-0.5 units/ml due to increase bleeding risk aPTT 66-85 seconds Monitor platelets by anticoagulation protocol: Yes   Plan:  No bolus due to bleeding risk Increase heparin  infusion conservatively to 1050 units/hr Check aPTT in 8 hours Daily heparin  level, aPTT and CBC  Thos Matsumoto D. Lendell, PharmD, BCPS, BCCCP 05/16/2023, 8:36 AM

## 2023-05-16 NOTE — Progress Notes (Signed)
 PHARMACY - ANTICOAGULATION CONSULT NOTE  Pharmacy Consult for heparin  Indication: atrial fibrillation  No Known Allergies  Patient Measurements: Height: 5' 11 (180.3 cm) Weight:  (Unable to weigh d/t bed malfunction) IBW/kg (Calculated) : 75.3 Heparin  Dosing Weight: 88.5 kg  Vital Signs: Temp: 97.5 F (36.4 C) (01/07 1549) Temp Source: Axillary (01/07 1549) BP: 152/86 (01/07 1603) Pulse Rate: 84 (01/07 1603)  Labs: Recent Labs    05/14/23 0505 05/14/23 0505 05/14/23 2227 05/15/23 0643 05/15/23 2114 05/16/23 0659 05/16/23 1743  HGB 8.2*  --  8.3* 8.1*  --  7.9*  --   HCT 26.3*  --  26.7* 26.0*  --  25.2*  --   PLT 199  --  224 237  --  269  --   APTT  --    < > 24  --  36 55* 51*  LABPROT  --   --  16.7*  --   --   --   --   INR  --   --  1.3*  --   --   --   --   HEPARINUNFRC  --   --   --   --  0.72* 0.62  --   CREATININE 3.58*  --   --  3.07*  --  2.67*  --    < > = values in this interval not displayed.    Estimated Creatinine Clearance: 27.6 mL/min (A) (by C-G formula based on SCr of 2.67 mg/dL (H)).  Assessment: 77 YOM found unresponsive and seizing for unknown time, at least 2 days. Patient has a history of Afib on Eliquis  PTA.  He was transitioned to IV heparin , then back to Eliquis  on 1/4.  He then had maroon stools and Eliquis  was held on 1/5.  No surgery/endoscopic interventions unless patient has significant life-threatening bleeding per Surgery/GI.  Pharmacy consulted to restart IV heparin  on 1/6.  aPTT sub-therapeutic at 55 sec.  aPTT and heparin  level are not yet correlating.  CBC relatively stable.  RN reports maroon stools overnight.  PM: aPTT 51 (decrease despite rate increase, but RN thinks drip may have been off/empty ~10 minutes approx 30 minutes prior to when level was drawn. No new stools since patient transferred to 5W this afternoon.  Goal of Therapy:  Heparin  level 0.3-0.5 units/ml due to increase bleeding risk aPTT 66-85 seconds Monitor  platelets by anticoagulation protocol: Yes   Plan:  No bolus due to bleeding risk Increase heparin  infusion conservatively to 1100 units/hr Check aPTT in 8 hours with AM labs Daily heparin  level, aPTT and CBC  Rocky Slade, PharmD, BCPS 05/16/2023, 7:10 PM  Please check AMION for all [facility] phone numbers After 10:00 PM, call Main Pharmacy [phone]

## 2023-05-16 NOTE — Evaluation (Signed)
 Occupational Therapy Evaluation Patient Details Name: Cory [name] MRN: [id number] DOB: 27-Jul-1945 Today's Date: 05/16/2023   History of Present Illness Pt is 78 year old presented to [facility] on  05/09/23 after found unresponsive and actively seizing at home. Intubated in ED. Pt found to be covid +. 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 Extubated 1/4. PMH - DM, HLD, HTN, Osteomyelitis, R BKA.   Clinical Impression   PTA, pt lives alone in level entry apartment, typically Modified Independent with ADLs and ambulatory using prosthetic and cane vs Rollator. Pt presents now with deficits in cognition, balance, strength and endurance. Pt requires step by step cues to sequence tasks and problem solve. Overall, pt requires Mod A x 2 for bed mobility and transfers using RW. Pt requires Min A for UB ADL and Max A for LB ADLs (+2 for safety in standing). Based on current functioning, recommend continued inpatient follow up therapy, <3 hours/day at DC prior to return home.   HR 137bpm with activity BP 140s/100s      If plan is discharge home, recommend the following: A lot of help with walking and/or transfers;Two people to help with walking and/or transfers;A lot of help with bathing/dressing/bathroom;Two people to help with bathing/dressing/bathroom;Assistance with cooking/housework;Assistance with feeding;Direct supervision/assist for medications management;Direct supervision/assist for financial management;Assist for transportation;Supervision due to cognitive status    Functional Status Assessment  Patient has had a recent decline in their functional status and demonstrates the ability to make significant improvements in function in a reasonable and predictable amount of time.  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), Monitor HR/BP Restrictions Weight Bearing Restrictions Per Provider Order: No      Mobility Bed Mobility Overal bed mobility: Needs Assistance Bed Mobility: Supine to Sit     Supine to sit: Mod assist, +2 for safety/equipment, +2 for physical assistance, Used rails, HOB elevated     General bed mobility comments: multimodal tactile cues to sequence and problem solve use of bedrail, scooting hips and lifting trunk    Transfers Overall transfer level: Needs assistance   Transfers: Sit to/from Stand, Bed to chair/wheelchair/BSC Sit to Stand: Mod assist, +2 physical assistance, +2 safety/equipment     Step pivot transfers: Mod assist, +2 physical assistance, +2 safety/equipment     General transfer comment: Mod A x 2 to stand at bedside with pt pulling on RW and truncal flexion noted. With cues for posture, assist for lines and step by step cues to manuever RW, pt able to take a few steps to recliner in room. Mod A x 1-2 to stand from recliner pushing from armrest for ADL assist      Balance Overall balance assessment: Needs assistance Sitting-balance support: Feet supported, Single extremity supported, No upper extremity supported Sitting balance-Leahy Scale: Fair     Standing balance support: Bilateral upper extremity supported, During functional activity Standing balance-Leahy Scale: Poor                             ADL either performed or assessed with clinical judgement   ADL Overall ADL's : Needs assistance/impaired Eating/Feeding: Minimal assistance;Sitting   Grooming: Wash/dry face;Supervision/safety;Sitting Grooming Details (indicate cue type and reason): able to wash face but anticipate increased assist needed with multi step  hygiene tasks Upper Body Bathing: Minimal assistance;Sitting   Lower Body Bathing: Maximal assistance;Sit to/from stand;+2 for safety/equipment   Upper Body Dressing : Minimal  assistance;Sitting   Lower Body Dressing: Maximal assistance;+2 for safety/equipment;Sitting/lateral leans;Sit to/from stand Lower Body Dressing Details (indicate cue type and reason): assisting some for donning prosthetic though confusion impacting ability to sequence. noted residual limb swollen and unable to fit all socks on limb Toilet Transfer: Moderate assistance;+2 for physical assistance;+2 for safety/equipment;Stand-pivot;Rolling walker (2 wheels)   Toileting- Clothing Manipulation and Hygiene: Maximal assistance;+2 for safety/equipment;Sitting/lateral lean;Sit to/from stand Toileting - Clothing Manipulation Details (indicate cue type and reason): bowel incontinence with mobility. Assist for cleanup while pt maintianing balance in standing with RW and second person for safety             Vision Baseline Vision/History: 1 Wears glasses Ability to See in Adequate Light: 0 Adequate Patient Visual Report: No change from baseline Vision Assessment?: No apparent visual deficits     Perception         Praxis         Pertinent Vitals/Pain Pain Assessment Pain Assessment: No/denies pain     Extremity/Trunk Assessment Upper Extremity Assessment Upper Extremity Assessment: Generalized weakness;Right hand dominant   Lower Extremity Assessment Lower Extremity Assessment: Defer to PT evaluation   Cervical / Trunk Assessment Cervical / Trunk Assessment: Normal   Communication Communication Communication: Difficulty communicating thoughts/reduced clarity of speech;Difficulty following commands/understanding Following commands: Follows one step commands with increased time Cueing Techniques: Verbal cues;Gestural cues;Tactile cues   Cognition Arousal: Alert Behavior During Therapy: Flat affect Overall Cognitive Status: Impaired/Different from baseline Area of Impairment: Orientation, Attention, Memory, Following commands, Safety/judgement, Awareness, Problem solving                  Orientation Level: Disoriented to, Situation, Place, Time Current Attention Level: Sustained, Selective Memory: Decreased short-term memory Following Commands: Follows one step commands inconsistently, Follows one step commands with increased time Safety/Judgement: Decreased awareness of safety, Decreased awareness of deficits Awareness: Intellectual Problem Solving: Slow processing, Decreased initiation, Difficulty sequencing, Requires verbal cues, Requires tactile cues General Comments: reported it was November and seemed shocked that it was January 7th. able to recall the month/date at end of session. inconsistent command following, answering some questions but not others. pleasant and participatory though still below cognitive baseline     General Comments  HR to 137 with activity, SpO2 WFL on RA and BP elevated throughout (140s/110s)    Exercises     Shoulder Instructions      Home Living Family/patient expects to be discharged to:: Private residence Living Arrangements: Alone Available Help at Discharge: Family;Available PRN/intermittently Type of Home: Apartment Home Access: Level entry     Home Layout: One level     Bathroom Shower/Tub: Chief Strategy Officer: Standard     Home Equipment: Rollator (4 wheels);Wheelchair - manual;Tub bench;Cane - single Librarian, Academic (2 wheels);Other (comment) (walking stick)          Prior Functioning/Environment Prior Level of Function : Independent/Modified Independent;Patient poor historian/Family not available             Mobility Comments: ambulates with prosthetic and walking stick or rollator ADLs Comments: Mod i for ADLs, reports still grocery shopping but does not drive. pt not clear on who assists him to the store and back        OT Problem List: Decreased strength;Decreased activity tolerance;Impaired balance (sitting and/or standing);Decreased cognition;Decreased safety  awareness;Decreased  knowledge of use of DME or AE;Cardiopulmonary status limiting activity      OT Treatment/Interventions: Self-care/ADL training;Therapeutic exercise;DME and/or AE instruction;Energy conservation;Therapeutic activities;Balance training;Patient/family education    OT Goals(Current goals can be found in the care plan section) Acute Rehab OT Goals Patient Stated Goal: agreeable to sit up in chair OT Goal Formulation: With patient Time For Goal Achievement: 05/30/23 Potential to Achieve Goals: Good  OT Frequency: Min 1X/week    Co-evaluation PT/OT/SLP Co-Evaluation/Treatment: Yes Reason for Co-Treatment: For patient/therapist safety;To address functional/ADL transfers;Necessary to address cognition/behavior during functional activity   OT goals addressed during session: ADL's and self-care;Proper use of Adaptive equipment and DME      AM-PAC OT 6 Clicks Daily Activity     Outcome Measure Help from another person eating meals?: A Little Help from another person taking care of personal grooming?: A Little Help from another person toileting, which includes using toliet, bedpan, or urinal?: A Lot Help from another person bathing (including washing, rinsing, drying)?: A Lot Help from another person to put on and taking off regular upper body clothing?: A Little Help from another person to put on and taking off regular lower body clothing?: A Lot 6 Click Score: 15   End of Session Equipment Utilized During Treatment: Gait belt;Rolling walker (2 wheels) Nurse Communication: Mobility status;Other (comment) (bloody stool)  Activity Tolerance: Patient tolerated treatment well Patient left: in chair;with call bell/phone within reach;Other (comment) (mitts reapplied)  OT Visit Diagnosis: Unsteadiness on feet (R26.81);Other abnormalities of gait and mobility (R26.89);Muscle weakness (generalized) (M62.81);Other symptoms and signs involving cognitive function                 Time: 9098-9064 OT Time Calculation (min): 34 min Charges:  OT General Charges $OT Visit: 1 Visit OT Evaluation $OT Eval Moderate Complexity: 1 Mod  Mliss NOVAK, OTR/L Acute Rehab Services Office: [phone]   Mliss Getting 05/16/2023, 10:40 AM

## 2023-05-16 NOTE — Progress Notes (Signed)
 No plans for surgical intervention during this stay if possible.  Hgb overall has been stable during his stay.  If patient remains stable, would like to avoid surgical intervention and allow resolution of current issues and plan for elective surgery with Dr. Moira.  However, if patient begins to have significantly bleeding that will not allow for stability and elective resection, may have to consider surgery while here.  Will follow.  Cory [name] 8:06 AM 05/16/2023

## 2023-05-16 NOTE — Progress Notes (Signed)
 Pt was transported to 5W05 VSS, no issues. Receiving RN at bedside, [place]. [name] (niece) notified of transfer.

## 2023-05-16 NOTE — Progress Notes (Signed)
 Pt CBGs in the 40's, Pt is alert and arousable, able to eat and drink. Pt was fed breakfast and had 2 cups of orange juice and two cups of cranberry juice. CBGs gradually increasing. Will continue to follow glucose closely.

## 2023-05-16 NOTE — Inpatient Diabetes Management (Signed)
 Inpatient Diabetes Program Recommendations  AACE/ADA: New Consensus Statement on Inpatient Glycemic Control (2015)  Target Ranges:  Prepandial:   less than 140 mg/dL      Peak postprandial:   less than 180 mg/dL (1-2 hours)      Critically ill patients:  140 - 180 mg/dL    Latest Reference Range & Units 05/15/23 07:28 05/15/23 11:38 05/15/23 16:45 05/15/23 19:21  Glucose-Capillary 70 - 99 mg/dL 797 (H)  3 units Novolog   152 (H)  2 units Novolog   10 units Semglee  @1454   101 (H) 114 (H)     10 units Semglee  @2145   (H): Data is abnormally high  Latest Reference Range & Units 05/16/23 07:28 05/16/23 07:47  Glucose-Capillary 70 - 99 mg/dL 42 (LL) 47 (L)  (LL): Data is critically low (L): Data is abnormally low  Home DM Meds: Humalog  2-10 units daily as needed for CBG >200      Lantus  26 units daily     Current Orders: Novolog  Sensitive Correction Scale/ SSI (0-9 units) TID AC + HS        Semglee  10 units BID    MD- Note Severe Hypoglycemia this AM (pt received total of 20 units Semglee  yesterday)  Please consider reducing the Semglee  to 10 units QHS     --Will follow patient during hospitalization--  Adina Rudolpho Arrow RN, MSN, CDCES Diabetes Coordinator Inpatient Glycemic Control Team Team Pager: [phone] (8a-5p)

## 2023-05-17 DIAGNOSIS — A419 Sepsis, unspecified organism: Secondary | ICD-10-CM | POA: Diagnosis not present

## 2023-05-17 DIAGNOSIS — R6520 Severe sepsis without septic shock: Secondary | ICD-10-CM | POA: Diagnosis not present

## 2023-05-17 DIAGNOSIS — R404 Transient alteration of awareness: Secondary | ICD-10-CM | POA: Diagnosis not present

## 2023-05-17 DIAGNOSIS — G9341 Metabolic encephalopathy: Secondary | ICD-10-CM | POA: Diagnosis not present

## 2023-05-17 LAB — CBC WITH DIFFERENTIAL/PLATELET
Abs Immature Granulocytes: 0.03 10*3/uL (ref 0.00–0.07)
Basophils Absolute: 0 10*3/uL (ref 0.0–0.1)
Basophils Relative: 0 %
Eosinophils Absolute: 0.1 10*3/uL (ref 0.0–0.5)
Eosinophils Relative: 3 %
HCT: 24.9 % — ABNORMAL LOW (ref 39.0–52.0)
Hemoglobin: 8 g/dL — ABNORMAL LOW (ref 13.0–17.0)
Immature Granulocytes: 1 %
Lymphocytes Relative: 33 %
Lymphs Abs: 1.6 10*3/uL (ref 0.7–4.0)
MCH: 24.2 pg — ABNORMAL LOW (ref 26.0–34.0)
MCHC: 32.1 g/dL (ref 30.0–36.0)
MCV: 75.5 fL — ABNORMAL LOW (ref 80.0–100.0)
Monocytes Absolute: 0.6 10*3/uL (ref 0.1–1.0)
Monocytes Relative: 13 %
Neutro Abs: 2.4 10*3/uL (ref 1.7–7.7)
Neutrophils Relative %: 50 %
Platelets: 252 10*3/uL (ref 150–400)
RBC: 3.3 MIL/uL — ABNORMAL LOW (ref 4.22–5.81)
RDW: 15.7 % — ABNORMAL HIGH (ref 11.5–15.5)
WBC: 4.8 10*3/uL (ref 4.0–10.5)
nRBC: 0 % (ref 0.0–0.2)

## 2023-05-17 LAB — APTT
aPTT: 41 s — ABNORMAL HIGH (ref 24–36)
aPTT: 47 s — ABNORMAL HIGH (ref 24–36)
aPTT: 59 s — ABNORMAL HIGH (ref 24–36)

## 2023-05-17 LAB — HEPATIC FUNCTION PANEL
ALT: 40 U/L (ref 0–44)
AST: 34 U/L (ref 15–41)
Albumin: 1.9 g/dL — ABNORMAL LOW (ref 3.5–5.0)
Alkaline Phosphatase: 52 U/L (ref 38–126)
Bilirubin, Direct: <0.1 mg/dL (ref 0.0–0.2)
Total Bilirubin: 0.7 mg/dL (ref 0.0–1.2)
Total Protein: 5.7 g/dL — ABNORMAL LOW (ref 6.5–8.1)

## 2023-05-17 LAB — GLUCOSE, CAPILLARY
Glucose-Capillary: 212 mg/dL — ABNORMAL HIGH (ref 70–99)
Glucose-Capillary: 126 mg/dL — ABNORMAL HIGH (ref 70–99)
Glucose-Capillary: 191 mg/dL — ABNORMAL HIGH (ref 70–99)
Glucose-Capillary: 295 mg/dL — ABNORMAL HIGH (ref 70–99)

## 2023-05-17 LAB — BASIC METABOLIC PANEL
Anion gap: 7 (ref 5–15)
BUN: 36 mg/dL — ABNORMAL HIGH (ref 8–23)
CO2: 25 mmol/L (ref 22–32)
Calcium: 7.5 mg/dL — ABNORMAL LOW (ref 8.9–10.3)
Chloride: 106 mmol/L (ref 98–111)
Creatinine, Ser: 2.71 mg/dL — ABNORMAL HIGH (ref 0.61–1.24)
GFR, Estimated: 24 mL/min — ABNORMAL LOW (ref >60)
Glucose, Bld: 275 mg/dL — ABNORMAL HIGH (ref 70–99)
Potassium: 4 mmol/L (ref 3.5–5.1)
Sodium: 138 mmol/L (ref 135–145)

## 2023-05-17 LAB — HEPARIN LEVEL (UNFRACTIONATED): Heparin Unfractionated: 0.3 [IU]/mL (ref 0.30–0.70)

## 2023-05-17 NOTE — Progress Notes (Signed)
 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 Per ED documentation, family had not heard from patient for the past two days. EMS was called out to patient's home by family and patient was found unresponsive and actively seizing at the side of the patient's bed. Patient's pupils were unequal with right deviation. Upon arrival to ED, patient was obtunded, CBG reading high, and on a 100% NRB. Patient was intubated by ED physician and PCCM consulted for further management.   12/31 Found unresponsive, with active seizures per EMS. Intubated upon arrival to ED  > Intubated, minimal sedation -COVID-positive Arterial line, Right IJ central line, Foley catheter.  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  Several chronic microhemorrhages and supratentorial brain 01/01 EEG with severe diffuse encephalopathy 01/02 renal function worsening, neuro consult with mentation not improving 01/03 mentation improved, following commands, overnight EEG without seizures 1/4 extubated Assessment & Plan:   Principal Problem:   AMS (altered mental status) Active Problems:   Septic shock (HCC)   Acute hypoxic respiratory failure COVID-positive UTI Severe sepsis, present on admission -Required intubation upon admission, successfully extubated, he is currently on room air -Need 10 days isolation for COVID-19 infection. -Treated 5 days of IV Rocephin , urine culture growing E. coli. -Weaned off steroids now he is extubated -Sepsis present on admission, most likely due to COVID-19 infection and  UTI  Metabolic encephalopathy Seizures -Was felt secondary to severe DKA, no recommendation for AED.  Neurology  Hyperosmolar nonketotic state on admission Diabetes mellitus, type II, uncontrolled -(Home med Lantus  26 units lispro to 10 units >CBG 200- some hypoglycemia during hospitalization -He has resolved, continue with sliding scale for now, hold on starting long-acting insulin  given him some hypoglycemia (was 42 yesterday), will have to tolerate glycemic to avoid hypoglycemia.  But will change his diet to do carb modified.  Colon cancer not operated on  Rectal bleeding with known proximal descending colon mass, positive on PET scan -Surgery input greatly appreciated - GI input greatly appreciated -Recommendation is for conservative management, and to avoid surgical intervention if bleeding has stabilized -CBC closely and transfuse as needed  AKI on ckd stage 3b Rhabdomyolysis - Baseline creatinine less than 2.  Peak creatinine at 5.3.  Creatinine trending down.  -  Foley remains in place.   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  Objective: Vitals:   05/17/23 0800 05/17/23 0826 05/17/23 0827 05/17/23 1231  BP: (!) 150/95 (!) 145/78 (!) 145/78 (!) 143/89  Pulse: (!) 106 (!) 103 (!) 111 88  Resp: (!) 27 16 (!) 22 (!) 21  Temp:  97.8 F (36.6 C) 97.8 F (36.6 C) 97.9 F (36.6 C)  TempSrc:  Oral Oral Oral  SpO2: 97%  97% 100% 96%  Weight:      Height:        Intake/Output Summary (Last 24 hours) at 05/17/2023 1314 Last data filed at 05/17/2023 0547 Gross per 24 hour  Intake 1626.51 ml  Output 2000 ml  Net -373.49 ml   Filed Weights   05/11/23 0406 05/13/23 0436 05/17/23 0500  Weight: 91.1 kg 94.1 kg 90.5 kg    Examination:  Awake Alert, negative, pleasant, confused, frail, chronically ill-appearing Symmetrical Chest wall movement, Good air movement bilaterally, CTAB RRR,No Gallops,Rubs or new Murmurs, No Parasternal Heave +ve B.Sounds, Abd Soft, No tenderness, No rebound - guarding or rigidity. No Cyanosis, Clubbing or edema, right BKA     Data Reviewed: I have personally reviewed following labs and imaging studies  CBC: Recent Labs  Lab 05/14/23 0505 05/14/23 2227 05/15/23 0643 05/16/23 0659 05/17/23 0432  WBC 7.6 6.1 5.7 5.1 4.8  NEUTROABS  --   --   --  2.4 2.4  HGB 8.2* 8.3* 8.1* 7.9* 8.0*  HCT 26.3* 26.7* 26.0* 25.2* 24.9*  MCV 75.8* 76.3* 76.0* 75.7* 75.5*  PLT 199 224 237 269 252    Basic Metabolic Panel: Recent Labs  Lab 05/11/23 0332 05/11/23 1501 05/12/23 0506 05/13/23 0444 05/14/23 0505 05/15/23 0643 05/16/23 0659 05/17/23 0432  NA 141   < > 145 149* 151* 145 145 138  K 3.3*   < > 3.5 3.5 3.7 4.2 3.5 4.0  CL 105   < > 108 112* 113* 110 110 106  CO2 20*   < > 24 26 27 24 26 25   GLUCOSE 153*   < > 102* 135* 85 217* 51* 275*  BUN 66*   < > 67* 66* 53* 49* 37* 36*  CREATININE 5.31*   < > 4.90* 4.25* 3.58* 3.07* 2.67* 2.71*  CALCIUM  8.0*   < > 8.2* 8.3* 8.3* 8.2* 8.1* 7.5*  MG 2.3  --  2.5* 2.5* 2.3  --   --   --   PHOS 4.5  --  4.5 4.6 3.5  --   --   --    < > = values in this interval not displayed.    GFR: Estimated Creatinine Clearance: 26.7 mL/min (A) (by C-G formula based on SCr of 2.71 mg/dL (H)).  Liver Function Tests: Recent Labs  Lab 05/11/23 0332 05/12/23 0506 05/17/23 0432  AST 138* 103* 34  ALT 52* 49* 40  ALKPHOS 44 49 52  BILITOT  0.3 0.5 0.7  PROT 6.0* 5.4* 5.7*  ALBUMIN  2.0* 1.8* 1.9*    CBG: Recent Labs  Lab 05/16/23 1140 05/16/23 1557 05/16/23 2023 05/17/23 0826 05/17/23 1231  GLUCAP 148* 225* 210* 212* 126*     Recent Results (from the past 240 hours)  Culture, blood (Routine x 2)     Status: None   Collection Time: 05/09/23 10:40 AM   Specimen: BLOOD LEFT ARM  Result Value Ref Range Status   Specimen Description BLOOD LEFT ARM  Final   Special Requests   Final    BOTTLES DRAWN AEROBIC AND ANAEROBIC Blood Culture adequate volume   Culture   Final    NO GROWTH 5 DAYS Performed at [facility], [address] [place], [place] [address]    Report Status 05/14/2023 FINAL  Final  Resp panel by RT-PCR (RSV, Flu A&B,  Covid) Anterior Nasal Swab     Status: Abnormal   Collection Time: 05/09/23 10:40 AM   Specimen: Anterior Nasal Swab  Result Value Ref Range Status   SARS Coronavirus 2 by RT PCR POSITIVE (A) NEGATIVE Final   Influenza A by PCR NEGATIVE NEGATIVE Final   Influenza B by PCR NEGATIVE NEGATIVE Final    Comment: (NOTE) The Xpert Xpress SARS-CoV-2/FLU/RSV plus assay is intended as an aid in the diagnosis of influenza from Nasopharyngeal swab specimens and should not be used as a sole basis for treatment. Nasal washings and aspirates are unacceptable for Xpert Xpress SARS-CoV-2/FLU/RSV testing.  Fact Sheet for Patients: bloggercourse.com  Fact Sheet for Healthcare Providers: seriousbroker.it  This test is not yet approved or cleared by the [place]  FDA and has been authorized for detection and/or diagnosis of SARS-CoV-2 by FDA under an Emergency Use Authorization (EUA). This EUA will remain in effect (meaning this test can be used) for the duration of the COVID-19 declaration under Section 564(b)(1) of the Act, 21 U.S.C. section 360bbb-3(b)(1), unless the authorization is terminated or revoked.     Resp Syncytial Virus  by PCR NEGATIVE NEGATIVE Final    Comment: (NOTE) Fact Sheet for Patients: bloggercourse.com  Fact Sheet for Healthcare Providers: seriousbroker.it  This test is not yet approved or cleared by the [place]  FDA and has been authorized for detection and/or diagnosis of SARS-CoV-2 by FDA under an Emergency Use Authorization (EUA). This EUA will remain in effect (meaning this test can be used) for the duration of the COVID-19 declaration under Section 564(b)(1) of the Act, 21 U.S.C. section 360bbb-3(b)(1), unless the authorization is terminated or revoked.  Performed at [facility], [address], [place], [place] [address]   Culture, blood (Routine x 2)     Status: None   Collection Time: 05/09/23 10:45 AM   Specimen: BLOOD  Result Value Ref Range Status   Specimen Description BLOOD RIGHT ANTECUBITAL  Final   Special Requests   Final    BOTTLES DRAWN AEROBIC AND ANAEROBIC Blood Culture adequate volume   Culture   Final    NO GROWTH 5 DAYS Performed at [facility], [address], [place], [place] [address]    Report Status 05/14/2023 FINAL  Final  Respiratory (~20 pathogens) panel by PCR     Status: None   Collection Time: 05/09/23  1:53 PM   Specimen: Nasopharyngeal Swab; Respiratory  Result Value Ref Range Status   Adenovirus NOT DETECTED NOT DETECTED Final   Coronavirus 229E NOT DETECTED NOT DETECTED Final    Comment: (NOTE) The Coronavirus on the Respiratory Panel, DOES NOT test for the novel  Coronavirus (2019 nCoV)    Coronavirus HKU1 NOT DETECTED NOT DETECTED Final   Coronavirus NL63 NOT DETECTED NOT DETECTED Final   Coronavirus OC43 NOT DETECTED NOT DETECTED Final   Metapneumovirus NOT DETECTED NOT DETECTED Final   Rhinovirus / Enterovirus NOT DETECTED NOT DETECTED Final   Influenza A NOT DETECTED NOT DETECTED Final   Influenza B NOT DETECTED NOT DETECTED Final   Parainfluenza Virus 1 NOT  DETECTED NOT DETECTED Final   Parainfluenza Virus 2 NOT DETECTED NOT DETECTED Final   Parainfluenza Virus 3 NOT DETECTED NOT DETECTED Final   Parainfluenza Virus 4 NOT DETECTED NOT DETECTED Final   Respiratory Syncytial Virus NOT DETECTED NOT DETECTED Final   Bordetella pertussis NOT DETECTED NOT DETECTED Final   Bordetella Parapertussis NOT DETECTED NOT DETECTED Final   Chlamydophila pneumoniae  NOT DETECTED NOT DETECTED Final   Mycoplasma pneumoniae NOT DETECTED NOT DETECTED Final    Comment: Performed at [facility], [address] [place], [place] [address]  MRSA Next Gen by PCR, Nasal     Status: None   Collection Time: 05/09/23  3:18 PM   Specimen: Nasal Mucosa; Nasal Swab  Result Value Ref Range Status   MRSA by PCR Next Gen NOT DETECTED NOT DETECTED Final    Comment: (NOTE) The GeneXpert MRSA Assay (FDA approved for NASAL specimens only), is one component of a comprehensive MRSA colonization surveillance program. It is not intended to diagnose MRSA infection nor to guide or monitor treatment for MRSA infections. Test performance is not FDA approved in patients less than 4 years old. Performed at [facility], [address] [place], [place] [address]   Urine Culture (for pregnant, neutropenic or urologic patients or patients with an indwelling urinary catheter)     Status: Abnormal   Collection Time: 05/11/23  4:50 PM   Specimen: Urine, Catheterized  Result Value Ref Range Status   Specimen Description URINE, CATHETERIZED  Final   Special Requests   Final    NONE Performed at [facility], [address], [place], [place] [address]    Culture (A)  Final    20,000 COLONIES/mL ESCHERICHIA COLI Confirmed Extended Spectrum Beta-Lactamase Producer (ESBL).  In bloodstream infections from ESBL organisms, carbapenems are preferred over piperacillin /tazobactam. They are shown to have a lower risk of mortality.    Report Status 05/13/2023 FINAL  Final    Organism ID, Bacteria ESCHERICHIA COLI (A)  Final      Susceptibility   Escherichia coli - MIC*    AMPICILLIN  >=32 RESISTANT Resistant     CEFAZOLIN  >=64 RESISTANT Resistant     CEFEPIME  >=32 RESISTANT Resistant     CEFTRIAXONE  >=64 RESISTANT Resistant     CIPROFLOXACIN >=4 RESISTANT Resistant     GENTAMICIN <=1 SENSITIVE Sensitive     IMIPENEM <=0.25 SENSITIVE Sensitive     NITROFURANTOIN <=16 SENSITIVE Sensitive     TRIMETH /SULFA  >=320 RESISTANT Resistant     AMPICILLIN /SULBACTAM >=32 RESISTANT Resistant     PIP/TAZO <=4 SENSITIVE Sensitive ug/mL    * 20,000 COLONIES/mL ESCHERICHIA COLI         Radiology Studies: No results found.      Scheduled Meds:  carvedilol   25 mg Oral BID WC   Chlorhexidine  Gluconate Cloth  6 each Topical Daily   docusate sodium   100 mg Oral BID   feeding supplement  237 mL Oral TID BM   insulin  aspart  0-5 Units Subcutaneous QHS   insulin  aspart  0-9 Units Subcutaneous TID WC   multivitamin with minerals  1 tablet Oral Daily   pantoprazole   40 mg Oral BID   polyethylene glycol  17 g Oral Daily   senna  2 tablet Oral QHS   tamsulosin   0.4 mg Oral Daily   Continuous Infusions:  heparin  1,100 Units/hr (05/17/23 0231)     LOS: 8 days       Brayton Lye, MD [facility]   To contact the attending provider between 7A-7P or the covering provider during after hours 7P-7A, please log into the web site [email or web address] and access using universal  password for that web site. If you do not have the password, please call the hospital operator.  05/17/2023, 1:14 PM

## 2023-05-17 NOTE — Plan of Care (Signed)
  Problem: Education: Goal: Knowledge of risk factors and measures for prevention of condition will improve Outcome: Progressing   Problem: Coping: Goal: Psychosocial and spiritual needs will be supported Outcome: Progressing   Problem: Respiratory: Goal: Will maintain a patent airway Outcome: Progressing Goal: Complications related to the disease process, condition or treatment will be avoided or minimized Outcome: Progressing   Problem: Education: Goal: Knowledge of General Education information will improve Description: Including pain rating scale, medication(s)/side effects and non-pharmacologic comfort measures Outcome: Progressing   Problem: Health Behavior/Discharge Planning: Goal: Ability to manage health-related needs will improve Outcome: Progressing   Problem: Clinical Measurements: Goal: Ability to maintain clinical measurements within normal limits will improve Outcome: Progressing

## 2023-05-17 NOTE — Progress Notes (Signed)
 PHARMACY - ANTICOAGULATION CONSULT NOTE  Pharmacy Consult for heparin  Indication: atrial fibrillation  No Known Allergies  Patient Measurements: Height: 5' 11 (180.3 cm) Weight: 90.5 kg (199 lb 8.3 oz) IBW/kg (Calculated) : 75.3 Heparin  Dosing Weight: 88.5 kg  Vital Signs: Temp: 99.6 F (37.6 C) (01/08 2000) Temp Source: Oral (01/08 2000) BP: 149/103 (01/08 2000) Pulse Rate: 96 (01/08 2000)  Labs: Recent Labs    05/15/23 9356 05/15/23 9356 05/15/23 2114 05/16/23 0659 05/16/23 1743 05/17/23 0432 05/17/23 1012 05/17/23 2217  HGB 8.1*  --   --  7.9*  --  8.0*  --   --   HCT 26.0*  --   --  25.2*  --  24.9*  --   --   PLT 237  --   --  269  --  252  --   --   APTT  --    < > 36 55*   < > 41* 47* 59*  HEPARINUNFRC  --   --  0.72* 0.62  --  0.30  --   --   CREATININE 3.07*  --   --  2.67*  --  2.71*  --   --    < > = values in this interval not displayed.    Estimated Creatinine Clearance: 26.7 mL/min (A) (by C-G formula based on SCr of 2.71 mg/dL (H)).  Assessment: 77 YOM found unresponsive and seizing for unknown time, at least 2 days. Patient has a history of Afib on Eliquis  PTA.  He was transitioned to IV heparin , then back to Eliquis  on 1/4.  He then had maroon stools and Eliquis  was held on 1/5.  No surgery/endoscopic interventions unless patient has significant life-threatening bleeding per Surgery/GI.  Pharmacy consulted to restart IV heparin  on 1/6.  1/8 PM update:  aPTT sub-therapeutic but trending up  Goal of Therapy:  Heparin  level 0.3-0.5 units/ml due to increase bleeding risk aPTT 66-84 seconds Monitor platelets by anticoagulation protocol: Yes   Plan:  No boluses Increase heparin  infusion conservatively to 1350 units/hr Heparin  level and aPTT in 8 hours  Lynwood Mckusick, PharmD, BCPS Clinical Pharmacist Phone: [phone]

## 2023-05-17 NOTE — Progress Notes (Signed)
 PHARMACY - ANTICOAGULATION CONSULT NOTE  Pharmacy Consult for heparin  Indication: atrial fibrillation  No Known Allergies  Patient Measurements: Height: 5' 11 (180.3 cm) Weight: 90.5 kg (199 lb 8.3 oz) IBW/kg (Calculated) : 75.3 Heparin  Dosing Weight: 88.5 kg  Vital Signs: Temp: 97.9 F (36.6 C) (01/08 1231) Temp Source: Oral (01/08 1231) BP: 143/89 (01/08 1231) Pulse Rate: 88 (01/08 1231)  Labs: Recent Labs    05/14/23 2227 05/15/23 0643 05/15/23 2114 05/16/23 0659 05/16/23 1743 05/17/23 0432 05/17/23 1012  HGB 8.3* 8.1*  --  7.9*  --  8.0*  --   HCT 26.7* 26.0*  --  25.2*  --  24.9*  --   PLT 224 237  --  269  --  252  --   APTT 24  --  36 55* 51* 41* 47*  LABPROT 16.7*  --   --   --   --   --   --   INR 1.3*  --   --   --   --   --   --   HEPARINUNFRC  --   --  0.72* 0.62  --  0.30  --   CREATININE  --  3.07*  --  2.67*  --  2.71*  --     Estimated Creatinine Clearance: 26.7 mL/min (A) (by C-G formula based on SCr of 2.71 mg/dL (H)).  Assessment: 77 YOM found unresponsive and seizing for unknown time, at least 2 days. Patient has a history of Afib on Eliquis  PTA.  He was transitioned to IV heparin , then back to Eliquis  on 1/4.  He then had maroon stools and Eliquis  was held on 1/5.  No surgery/endoscopic interventions unless patient has significant life-threatening bleeding per Surgery/GI.  Pharmacy consulted to restart IV heparin  on 1/6.   Goal of Therapy:  Heparin  level 0.3-0.5 units/ml due to increase bleeding risk aPTT 66-85 seconds Monitor platelets by anticoagulation protocol: Yes   Plan:  No bolus due to bleeding risk Increase heparin  infusion conservatively to 1250 units/hr Daily heparin  level, aPTT and CBC  Thank you Olam Monte, PharmD 05/17/2023, 1:01 PM  Please check AMION for all [facility] Pharmacy phone numbers After 10:00 PM, call Main Pharmacy [phone]

## 2023-05-18 DIAGNOSIS — R404 Transient alteration of awareness: Secondary | ICD-10-CM | POA: Diagnosis not present

## 2023-05-18 DIAGNOSIS — R6521 Severe sepsis with septic shock: Secondary | ICD-10-CM

## 2023-05-18 DIAGNOSIS — A419 Sepsis, unspecified organism: Secondary | ICD-10-CM | POA: Diagnosis not present

## 2023-05-18 LAB — BASIC METABOLIC PANEL
Anion gap: 7 (ref 5–15)
BUN: 29 mg/dL — ABNORMAL HIGH (ref 8–23)
CO2: 24 mmol/L (ref 22–32)
Calcium: 7.8 mg/dL — ABNORMAL LOW (ref 8.9–10.3)
Chloride: 108 mmol/L (ref 98–111)
Creatinine, Ser: 2.46 mg/dL — ABNORMAL HIGH (ref 0.61–1.24)
GFR, Estimated: 26 mL/min — ABNORMAL LOW (ref >60)
Glucose, Bld: 245 mg/dL — ABNORMAL HIGH (ref 70–99)
Potassium: 4 mmol/L (ref 3.5–5.1)
Sodium: 139 mmol/L (ref 135–145)

## 2023-05-18 LAB — CBC WITH DIFFERENTIAL/PLATELET
Abs Immature Granulocytes: 0.04 10*3/uL (ref 0.00–0.07)
Basophils Absolute: 0 10*3/uL (ref 0.0–0.1)
Basophils Relative: 0 %
Eosinophils Absolute: 0.1 10*3/uL (ref 0.0–0.5)
Eosinophils Relative: 2 %
HCT: 24.4 % — ABNORMAL LOW (ref 39.0–52.0)
Hemoglobin: 8 g/dL — ABNORMAL LOW (ref 13.0–17.0)
Immature Granulocytes: 1 %
Lymphocytes Relative: 24 %
Lymphs Abs: 1.1 10*3/uL (ref 0.7–4.0)
MCH: 24.5 pg — ABNORMAL LOW (ref 26.0–34.0)
MCHC: 32.8 g/dL (ref 30.0–36.0)
MCV: 74.6 fL — ABNORMAL LOW (ref 80.0–100.0)
Monocytes Absolute: 0.5 10*3/uL (ref 0.1–1.0)
Monocytes Relative: 10 %
Neutro Abs: 2.8 10*3/uL (ref 1.7–7.7)
Neutrophils Relative %: 63 %
Platelets: 249 10*3/uL (ref 150–400)
RBC: 3.27 MIL/uL — ABNORMAL LOW (ref 4.22–5.81)
RDW: 15.8 % — ABNORMAL HIGH (ref 11.5–15.5)
WBC: 4.4 10*3/uL (ref 4.0–10.5)
nRBC: 0 % (ref 0.0–0.2)

## 2023-05-18 LAB — RETICULOCYTES
Immature Retic Fract: 33.4 % — ABNORMAL HIGH (ref 2.3–15.9)
RBC.: 3.01 MIL/uL — ABNORMAL LOW (ref 4.22–5.81)
Retic Count, Absolute: 48.2 10*3/uL (ref 19.0–186.0)
Retic Ct Pct: 1.6 % (ref 0.4–3.1)

## 2023-05-18 LAB — IRON AND TIBC
Iron: 27 ug/dL — ABNORMAL LOW (ref 45–182)
Saturation Ratios: 11 % — ABNORMAL LOW (ref 17.9–39.5)
TIBC: 256 ug/dL (ref 250–450)
UIBC: 229 ug/dL

## 2023-05-18 LAB — GLUCOSE, CAPILLARY
Glucose-Capillary: 240 mg/dL — ABNORMAL HIGH (ref 70–99)
Glucose-Capillary: 168 mg/dL — ABNORMAL HIGH (ref 70–99)
Glucose-Capillary: 118 mg/dL — ABNORMAL HIGH (ref 70–99)
Glucose-Capillary: 292 mg/dL — ABNORMAL HIGH (ref 70–99)

## 2023-05-18 LAB — PREPARE RBC (CROSSMATCH)

## 2023-05-18 LAB — VITAMIN B12: Vitamin B-12: 443 pg/mL (ref 180–914)

## 2023-05-18 LAB — FERRITIN: Ferritin: 46 ng/mL (ref 24–336)

## 2023-05-18 LAB — HEPARIN LEVEL (UNFRACTIONATED): Heparin Unfractionated: 0.15 [IU]/mL — ABNORMAL LOW (ref 0.30–0.70)

## 2023-05-18 LAB — ABO/RH: ABO/RH(D): O POS

## 2023-05-18 LAB — FOLATE: Folate: 10.8 ng/mL (ref >5.9)

## 2023-05-18 LAB — APTT: aPTT: 37 s — ABNORMAL HIGH (ref 24–36)

## 2023-05-18 MED ORDER — FOLIC ACID 1 MG PO TABS
1.0000 mg | ORAL_TABLET | Freq: Every day | ORAL | Status: DC
  Administered 2023-05-18 – 2023-05-22 (×5): 1 mg via ORAL
  Filled 2023-05-18 (×5): qty 1

## 2023-05-18 MED ORDER — INSULIN GLARGINE-YFGN 100 UNIT/ML ~~LOC~~ SOLN
3.0000 [IU] | Freq: Every day | SUBCUTANEOUS | Status: DC
  Administered 2023-05-18 – 2023-05-19 (×2): 3 [IU] via SUBCUTANEOUS
  Filled 2023-05-18 (×2): qty 0.03

## 2023-05-18 MED ORDER — APIXABAN 2.5 MG PO TABS
2.5000 mg | ORAL_TABLET | Freq: Two times a day (BID) | ORAL | Status: DC
  Administered 2023-05-18 – 2023-05-22 (×9): 2.5 mg via ORAL
  Filled 2023-05-18 (×9): qty 1

## 2023-05-18 MED ORDER — INSULIN GLARGINE-YFGN 100 UNIT/ML ~~LOC~~ SOLN: 5.0000 [IU] | Freq: Every day | SUBCUTANEOUS | Status: DC

## 2023-05-18 MED ORDER — SODIUM CHLORIDE 0.9% IV SOLUTION: Freq: Once | INTRAVENOUS | Status: AC

## 2023-05-18 NOTE — Progress Notes (Signed)
 Assessed: patient able to hold a moderate to strong grip. He is able to hold onto his snack and feet himself. Patient is fully awake and alert and was able to hold his strawberry ensure and drink it all.

## 2023-05-18 NOTE — Inpatient Diabetes Management (Signed)
 Inpatient Diabetes Program Recommendations  AACE/ADA: New Consensus Statement on Inpatient Glycemic Control (2015)  Target Ranges:  Prepandial:   less than 140 mg/dL      Peak postprandial:   less than 180 mg/dL (1-2 hours)      Critically ill patients:  140 - 180 mg/dL   Lab Results  Component Value Date   GLUCAP 240 (H) 05/18/2023   HGBA1C 14.0 (H) 12/27/2022    Latest Reference Range & Units 05/17/23 08:26 05/17/23 12:31 05/17/23 16:32 05/17/23 21:45 05/18/23 08:23  Glucose-Capillary 70 - 99 mg/dL 787 (H) 873 (H) 808 (H) 295 (H) 240 (H)  (H): Data is abnormally high Review of Glycemic Control  Diabetes history: type 2 Outpatient Diabetes medications: Lantus  26 units daily, Humalog  2-10 units daily as needed for CBG>200 mg/dl Current orders for Inpatient glycemic control: Novolog  0-9 units correction scale TID, HS scale  Inpatient Diabetes Program Recommendations:   Noted that CBGs have been greater than 180 mg/dl.   Recommend adding Semglee  10 units daily if blood sugars continue to be elevated.   Marjorie Lunger RN BSN CDE Diabetes Coordinator Pager: [phone]  8am-5pm

## 2023-05-18 NOTE — Progress Notes (Signed)
 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 Per ED documentation, family had not heard from patient for the past two days. EMS was called out to patient's home by family and patient was found unresponsive and actively seizing at the side of the patient's bed. Patient's pupils were unequal with right deviation. Upon arrival to ED, patient was obtunded, CBG reading high, and on a 100% NRB. Patient was intubated by ED physician and PCCM consulted for further management.   12/31 Found unresponsive, with active seizures per EMS. Intubated upon arrival to ED  > Intubated, minimal sedation -COVID-positive Arterial line, Right IJ central line, Foley catheter.  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  Several chronic microhemorrhages and supratentorial brain 01/01 EEG with severe diffuse encephalopathy 01/02 renal function worsening, neuro consult with mentation not improving 01/03 mentation improved, following commands, overnight EEG without seizures 1/4 extubated  Assessment & Plan:   Principal Problem:   AMS (altered mental status) Active Problems:   Septic shock (HCC)   Acute hypoxic respiratory failure COVID-positive UTI Severe sepsis, present on admission -Required intubation upon admission, successfully extubated, he is currently on room air -Need 10 days isolation for COVID-19 infection. -Treated 5 days of IV Rocephin , urine culture growing E. coli. -Weaned off steroids now he is extubated -Sepsis present on admission, most likely due to COVID-19 infection and  UTI  Acute metabolic encephalopathy Seizures -Was felt secondary to severe DKA, no recommendation for AED by Neurology -RI brain this admission with no acute findings, but significant for chronic infarcts, and mild chronic small vessel ischemic changes, and several chronic microhemorrhages of chronic hypertensive microangiopathy  Hyperosmolar nonketotic state on admission Diabetes mellitus, type II, uncontrolled -Home med Lantus  26 units lispro to 10 units - DKA has resolved -Patient with labile diabetes mellitus, he had few low readings over the last few days, so has not been started on long-acting insulin , but currently persistently hyperglycemic, so we will start on low-dose Semglee , continue with insulin  sliding scale as well. -Keep on carb modified diet.  Colon cancer not operated on  Rectal bleeding with known proximal descending colon mass, positive on PET scan Anemia, multifactorial, anemia of chronic kidney disease and blood loss anemia -Surgery input greatly appreciated, recommendation for conservative management currently, and to avoid surgical intervention this hospitalization, till he is more medically stable, where he can follow primary surgeon at [facility] as an outpatient, unless he is having significant bleeding, - GI input greatly appreciated , no plan for endoscopic procedure given its known to be secondary to mass. -Recommendation is for conservative management, and to avoid surgical intervention if bleeding has stabilized -CBC closely and transfuse as needed, hemoglobin is 8 today, will challenge patient with Eliquis , so transfuse 1 unit PRBC discussed with niece, agreeable to transfusion -will check iron -ferritin level, and will transfuse IV iron  if needed.   AKI on ckd stage 3b Rhabdomyolysis - Baseline creatinine less than 2.  Peak creatinine at 5.3.  Creatinine trending down.  -  Foley remains in place.   Hypernatremia -Resolved  A-flutter CHADVASC >3 on  Eliquis  -Resumed Coreg  12.5 twice daily--Keep on monitors -He is on heparin  GTT, hemoglobin has been stable for last few days, with no evidence of recurrent GI bleed, will start on Quist, will lower dose to 2.5 mg twice daily given worsening renal function.   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 Present on admission. -needs outpatient follow-up -He has a Foley catheter, will attempt voiding trial  Transaminitis -Resolved  BKA 2022  secondary osteomyelitis  DVT prophylaxis: Heparin  gtt >> Eliquis  Code Status: (Full) Family Communication: D/W niece [name] by phone Disposition:   Status is: Inpatient    Consultants:  PCCM GI General surgery neurolgy   Subjective:  No significant events overnight as discussed with staff, patient denies any complaints today.  Objective: Vitals:   05/18/23 0000 05/18/23 0433 05/18/23 0500 05/18/23 0825  BP: 135/78 120/65  134/83  Pulse: 87 100  (!) 112  Resp: 19 19  15   Temp: 98.8 F (37.1 C) 99.3 F (37.4 C)  98.9 F (37.2 C)  TempSrc: Oral Oral  Oral  SpO2: 99% 96%  98%  Weight:   89 kg   Height:        Intake/Output Summary (Last 24 hours) at 05/18/2023 1157 Last data filed at 05/18/2023 0103 Gross per 24 hour  Intake --  Output 2700 ml  Net -2700 ml   Filed Weights   05/13/23 0436 05/17/23 0500 05/18/23 0500  Weight: 94.1 kg 90.5 kg 89 kg    Examination:  Awake Alert, negative, pleasant, confused, frail, chronically ill-appearing Symmetrical Chest wall movement, Good air movement bilaterally, CTAB RRR,No Gallops,Rubs or new Murmurs, No Parasternal Heave +ve B.Sounds, Abd Soft, No tenderness, No rebound - guarding or rigidity. No Cyanosis, Clubbing or edema, right BKA     Data Reviewed: I have personally reviewed following labs and imaging studies  CBC: Recent Labs  Lab 05/14/23 2227 05/15/23 0643 05/16/23 0659 05/17/23 0432 05/18/23 0910  WBC 6.1 5.7 5.1 4.8  4.4  NEUTROABS  --   --  2.4 2.4 2.8  HGB 8.3* 8.1* 7.9* 8.0* 8.0*  HCT 26.7* 26.0* 25.2* 24.9* 24.4*  MCV 76.3* 76.0* 75.7* 75.5* 74.6*  PLT 224 237 269 252 249    Basic Metabolic Panel: Recent Labs  Lab 05/12/23 0506 05/13/23 0444 05/14/23 0505 05/15/23 0643 05/16/23 0659 05/17/23 0432 05/18/23 0910  NA 145 149* 151* 145 145 138 139  K 3.5 3.5 3.7 4.2 3.5 4.0 4.0  CL 108 112* 113* 110 110 106 108  CO2 24 26 27 24 26 25 24   GLUCOSE 102* 135* 85 217* 51* 275* 245*  BUN 67* 66* 53* 49* 37* 36* 29*  CREATININE 4.90* 4.25* 3.58* 3.07* 2.67* 2.71* 2.46*  CALCIUM  8.2* 8.3* 8.3* 8.2* 8.1* 7.5* 7.8*  MG 2.5* 2.5* 2.3  --   --   --   --   PHOS 4.5 4.6 3.5  --   --   --   --     GFR: Estimated Creatinine Clearance: 27.2 mL/min (A) (by C-G formula based on SCr of 2.46 mg/dL (H)).  Liver Function Tests: Recent Labs  Lab 05/12/23 0506 05/17/23 0432  AST 103* 34  ALT 49* 40  ALKPHOS 49 52  BILITOT 0.5 0.7  PROT 5.4* 5.7*  ALBUMIN  1.8* 1.9*    CBG: Recent Labs  Lab 05/17/23 0826 05/17/23 1231 05/17/23 1632 05/17/23 2145 05/18/23 0823  GLUCAP 212* 126* 191* 295* 240*  Recent Results (from the past 240 hours)  Culture, blood (Routine x 2)     Status: None   Collection Time: 05/09/23 10:40 AM   Specimen: BLOOD LEFT ARM  Result Value Ref Range Status   Specimen Description BLOOD LEFT ARM  Final   Special Requests   Final    BOTTLES DRAWN AEROBIC AND ANAEROBIC Blood Culture adequate volume   Culture   Final    NO GROWTH 5 DAYS Performed at [facility], [address] [place], [place] [address]    Report Status 05/14/2023 FINAL  Final  Resp panel by RT-PCR (RSV, Flu A&B, Covid) Anterior Nasal Swab     Status: Abnormal   Collection Time: 05/09/23 10:40 AM   Specimen: Anterior Nasal Swab  Result Value Ref Range Status   SARS Coronavirus 2 by RT PCR POSITIVE (A) NEGATIVE Final   Influenza A by PCR NEGATIVE NEGATIVE Final   Influenza B by PCR NEGATIVE  NEGATIVE Final    Comment: (NOTE) The Xpert Xpress SARS-CoV-2/FLU/RSV plus assay is intended as an aid in the diagnosis of influenza from Nasopharyngeal swab specimens and should not be used as a sole basis for treatment. Nasal washings and aspirates are unacceptable for Xpert Xpress SARS-CoV-2/FLU/RSV testing.  Fact Sheet for Patients: bloggercourse.com  Fact Sheet for Healthcare Providers: seriousbroker.it  This test is not yet approved or cleared by the [place]  FDA and has been authorized for detection and/or diagnosis of SARS-CoV-2 by FDA under an Emergency Use Authorization (EUA). This EUA will remain in effect (meaning this test can be used) for the duration of the COVID-19 declaration under Section 564(b)(1) of the Act, 21 U.S.C. section 360bbb-3(b)(1), unless the authorization is terminated or revoked.     Resp Syncytial Virus by PCR NEGATIVE NEGATIVE Final    Comment: (NOTE) Fact Sheet for Patients: bloggercourse.com  Fact Sheet for Healthcare Providers: seriousbroker.it  This test is not yet approved or cleared by the [place]  FDA and has been authorized for detection and/or diagnosis of SARS-CoV-2 by FDA under an Emergency Use Authorization (EUA). This EUA will remain in effect (meaning this test can be used) for the duration of the COVID-19 declaration under Section 564(b)(1) of the Act, 21 U.S.C. section 360bbb-3(b)(1), unless the authorization is terminated or revoked.  Performed at [facility], [address], [place], [place] [address]   Culture, blood (Routine x 2)     Status: None   Collection Time: 05/09/23 10:45 AM   Specimen: BLOOD  Result Value Ref Range Status   Specimen Description BLOOD RIGHT ANTECUBITAL  Final   Special Requests   Final    BOTTLES DRAWN AEROBIC AND ANAEROBIC Blood Culture adequate volume   Culture   Final    NO  GROWTH 5 DAYS Performed at [facility], [address], [place], [place] [address]    Report Status 05/14/2023 FINAL  Final  Respiratory (~20 pathogens) panel by PCR     Status: None   Collection Time: 05/09/23  1:53 PM   Specimen: Nasopharyngeal Swab; Respiratory  Result Value Ref Range Status   Adenovirus NOT DETECTED NOT DETECTED Final   Coronavirus 229E NOT DETECTED NOT DETECTED Final    Comment: (NOTE) The Coronavirus on the Respiratory Panel, DOES NOT test for the novel  Coronavirus (2019 nCoV)    Coronavirus HKU1 NOT DETECTED NOT DETECTED Final   Coronavirus NL63 NOT DETECTED NOT DETECTED Final   Coronavirus OC43 NOT DETECTED NOT DETECTED  Final   Metapneumovirus NOT DETECTED NOT DETECTED Final   Rhinovirus / Enterovirus NOT DETECTED NOT DETECTED Final   Influenza A NOT DETECTED NOT DETECTED Final   Influenza B NOT DETECTED NOT DETECTED Final   Parainfluenza Virus 1 NOT DETECTED NOT DETECTED Final   Parainfluenza Virus 2 NOT DETECTED NOT DETECTED Final   Parainfluenza Virus 3 NOT DETECTED NOT DETECTED Final   Parainfluenza Virus 4 NOT DETECTED NOT DETECTED Final   Respiratory Syncytial Virus NOT DETECTED NOT DETECTED Final   Bordetella pertussis NOT DETECTED NOT DETECTED Final   Bordetella Parapertussis NOT DETECTED NOT DETECTED Final   Chlamydophila pneumoniae NOT DETECTED NOT DETECTED Final   Mycoplasma pneumoniae NOT DETECTED NOT DETECTED Final    Comment: Performed at [facility], [address], [place], [place] [address]  MRSA Next Gen by PCR, Nasal     Status: None   Collection Time: 05/09/23  3:18 PM   Specimen: Nasal Mucosa; Nasal Swab  Result Value Ref Range Status   MRSA by PCR Next Gen NOT DETECTED NOT DETECTED Final    Comment: (NOTE) The GeneXpert MRSA Assay (FDA approved for NASAL specimens only), is one component of a comprehensive MRSA colonization surveillance program. It is not intended to diagnose MRSA infection nor to guide or monitor  treatment for MRSA infections. Test performance is not FDA approved in patients less than 73 years old. Performed at [facility], [address], [place], [place] [address]   Urine Culture (for pregnant, neutropenic or urologic patients or patients with an indwelling urinary catheter)     Status: Abnormal   Collection Time: 05/11/23  4:50 PM   Specimen: Urine, Catheterized  Result Value Ref Range Status   Specimen Description URINE, CATHETERIZED  Final   Special Requests   Final    NONE Performed at [facility], [address], [place], [place] [address]    Culture (A)  Final    20,000 COLONIES/mL ESCHERICHIA COLI Confirmed Extended Spectrum Beta-Lactamase Producer (ESBL).  In bloodstream infections from ESBL organisms, carbapenems are preferred over piperacillin /tazobactam. They are shown to have a lower risk of mortality.    Report Status 05/13/2023 FINAL  Final   Organism ID, Bacteria ESCHERICHIA COLI (A)  Final      Susceptibility   Escherichia coli - MIC*    AMPICILLIN  >=32 RESISTANT Resistant     CEFAZOLIN  >=64 RESISTANT Resistant     CEFEPIME  >=32 RESISTANT Resistant     CEFTRIAXONE  >=64 RESISTANT Resistant     CIPROFLOXACIN >=4 RESISTANT Resistant     GENTAMICIN <=1 SENSITIVE Sensitive     IMIPENEM <=0.25 SENSITIVE Sensitive     NITROFURANTOIN <=16 SENSITIVE Sensitive     TRIMETH /SULFA  >=320 RESISTANT Resistant     AMPICILLIN /SULBACTAM >=32 RESISTANT Resistant     PIP/TAZO <=4 SENSITIVE Sensitive ug/mL    * 20,000 COLONIES/mL ESCHERICHIA COLI         Radiology Studies: No results found.      Scheduled Meds:  apixaban   2.5 mg Oral BID   carvedilol   25 mg Oral BID WC   Chlorhexidine  Gluconate Cloth  6 each Topical Daily   docusate sodium   100 mg Oral BID   feeding supplement  237 mL Oral TID BM   insulin  aspart  0-5 Units Subcutaneous QHS   insulin  aspart  0-9 Units Subcutaneous TID WC   insulin  glargine-yfgn  5 Units Subcutaneous Daily    multivitamin with minerals  1 tablet  Oral Daily   pantoprazole   40 mg Oral BID   polyethylene glycol  17 g Oral Daily   senna  2 tablet Oral QHS   tamsulosin   0.4 mg Oral Daily   Continuous Infusions:     LOS: 9 days       Brayton Lye, MD [facility]   To contact the attending provider between 7A-7P or the covering provider during after hours 7P-7A, please log into the web site [email or web address] and access using universal [place] password for that web site. If you do not have the password, please call the hospital operator.  05/18/2023, 11:57 AM

## 2023-05-18 NOTE — Progress Notes (Signed)
 [facility] Progress Note     Subjective: CC:  Denies known blood per rectum today, may have had a small amt on chuck pad. No N/V. Denies abd pain.  Objective: Vital signs in last 24 hours: Temp:  [97.9 F (36.6 C)-99.6 F (37.6 C)] 98.9 F (37.2 C) (01/09 0825) Pulse Rate:  [87-112] 112 (01/09 0825) Resp:  [15-21] 15 (01/09 0825) BP: (120-149)/(65-103) 134/83 (01/09 0825) SpO2:  [90 %-99 %] 98 % (01/09 0825) Weight:  [89 kg] 89 kg (01/09 0500) Last BM Date : 05/17/23  Intake/Output from previous day: 01/08 0701 - 01/09 0700 In: -  Out: 2700 [Urine:2700] Intake/Output this shift: No intake/output data recorded.  PE: Gen:  Alert, NAD, pleasant Card:  Regular rate and rhythm Pulm:  Normal effort Abd: Soft, non-tender, non-distended Skin: warm and dry, no rashes  Psych: A&Ox3   Lab Results:  Recent Labs    05/17/23 0432 05/18/23 0910  WBC 4.8 4.4  HGB 8.0* 8.0*  HCT 24.9* 24.4*  PLT 252 249   BMET Recent Labs    05/17/23 0432 05/18/23 0910  NA 138 139  K 4.0 4.0  CL 106 108  CO2 25 24  GLUCOSE 275* 245*  BUN 36* 29*  CREATININE 2.71* 2.46*  CALCIUM  7.5* 7.8*   PT/INR No results for input(s): LABPROT, INR in the last 72 hours. CMP     Component Value Date/Time   NA 139 05/18/2023 0910   K 4.0 05/18/2023 0910   CL 108 05/18/2023 0910   CO2 24 05/18/2023 0910   GLUCOSE 245 (H) 05/18/2023 0910   BUN 29 (H) 05/18/2023 0910   CREATININE 2.46 (H) 05/18/2023 0910   CALCIUM  7.8 (L) 05/18/2023 0910   PROT 5.7 (L) 05/17/2023 0432   ALBUMIN  1.9 (L) 05/17/2023 0432   AST 34 05/17/2023 0432   ALT 40 05/17/2023 0432   ALKPHOS 52 05/17/2023 0432   BILITOT 0.7 05/17/2023 0432   GFRNONAA 26 (L) 05/18/2023 0910   Lipase     Component Value Date/Time   LIPASE 27 12/27/2022 0130       Studies/Results: No results found.  Anti-infectives: Anti-infectives (From admission, onward)    Start     Dose/Rate Route Frequency Ordered Stop    05/11/23 1000  cefTRIAXone  (ROCEPHIN ) 2 g in sodium chloride  0.9 % 100 mL IVPB  Status:  Discontinued        2 g 200 mL/hr over 30 Minutes Intravenous Every 24 hours 05/10/23 0855 05/10/23 0905   05/11/23 1000  cefTRIAXone  (ROCEPHIN ) 2 g in sodium chloride  0.9 % 100 mL IVPB        2 g 200 mL/hr over 30 Minutes Intravenous Every 24 hours 05/10/23 0905 05/13/23 1634   05/11/23 1000  remdesivir  100 mg in sodium chloride  0.9 % 100 mL IVPB       Placed in Followed by Linked Group   100 mg 200 mL/hr over 30 Minutes Intravenous Daily 05/10/23 0910 05/12/23 1040   05/10/23 1000  remdesivir  200 mg in sodium chloride  0.9% 250 mL IVPB       Placed in Followed by Linked Group   200 mg 580 mL/hr over 30 Minutes Intravenous Once 05/10/23 0910 05/10/23 1134   05/09/23 1400  ampicillin  (OMNIPEN) 2 g in sodium chloride  0.9 % 100 mL IVPB  Status:  Discontinued        2 g 300 mL/hr over 20 Minutes Intravenous Every 8 hours 05/09/23 1317 05/10/23 0855   05/09/23  1330  cefTRIAXone  (ROCEPHIN ) 2 g in sodium chloride  0.9 % 100 mL IVPB  Status:  Discontinued        2 g 200 mL/hr over 30 Minutes Intravenous Every 12 hours 05/09/23 1317 05/10/23 0855   05/09/23 1330  vancomycin  (VANCOREADY) IVPB 1750 mg/350 mL        1,750 mg 175 mL/hr over 120 Minutes Intravenous  Once 05/09/23 1321 05/09/23 1822   05/09/23 1330  acyclovir  (ZOVIRAX ) 800 mg in dextrose  5 % 250 mL IVPB  Status:  Discontinued        800 mg 266 mL/hr over 60 Minutes Intravenous Every 24 hours 05/09/23 1324 05/10/23 0855   05/09/23 1324  vancomycin  variable dose per unstable renal function (pharmacist dosing)  Status:  Discontinued         Does not apply See admin instructions 05/09/23 1325 05/10/23 0855   05/09/23 1100  piperacillin -tazobactam (ZOSYN ) IVPB 3.375 g        3.375 g 100 mL/hr over 30 Minutes Intravenous  Once 05/09/23 1047 05/09/23 1124        Assessment/Plan  Splenic flexure mass with melena No plans for acute surgical  intervention. Hgb overall has been stable during his stay, minimal blood per rectum, not requiring transfusion. Would like to avoid surgical intervention and allow resolution of current issues and plan for elective surgery with his surgeon, Dr. Moira. Risk vs benefit of resumption of Eliquis  per [facility], with his aflutter and malignancy he is likely high risk. General surgery will sign off. Please call back if he develops increased blood per rectum/signs of worsening anemia.   Encephalopathy Seizures, suspect related to hyperglycemia  DKA  DM2, poorly controlled  COVID+ HTN HLD BKA 2022 due to osteomyelitis  Anemia   LOS: 9 days   I reviewed hospitalist notes, last 24 h vitals and pain scores, last 48 h intake and output, last 24 h labs and trends, and last 24 h imaging results.  This care required moderate level of medical decision making.   Almarie Pringle, PA-C [facility] Please see Amion for pager number during day hours 7:00am-4:30pm

## 2023-05-18 NOTE — Progress Notes (Signed)
 PT Cancellation Note  Patient Details Name: Cory [name] MRN: [id number] DOB: February 12, 1946   Cancelled Treatment:    Reason Eval/Treat Not Completed: Other (comment)  Patient sleeping soundly and not easily aroused. Will attempt later today as schedule permits.    Macario RAMAN, PT Acute Rehabilitation Services  Office [phone]   Macario SHAUNNA Soja 05/18/2023, 11:20 AM

## 2023-05-18 NOTE — Progress Notes (Signed)
 Physical Therapy Treatment Patient Details Name: Cory [name] MRN: [id number] DOB: 1945/11/04 Today's Date: 05/18/2023   History of Present Illness Pt is 78 year old presented to [facility] on  05/09/23 after found unresponsive and actively seizing at home. Intubated in ED. Pt found to be covid +. Extubated 1/4. 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 PMH - DM, HLD, HTN, Osteomyelitis, R BKA.    PT Comments  Second attempt to see pt today and again sleeping on arrival. Able to arouse, although pt remained lethargic throughout session. Difficult to understand what he was saying. Participated with some supine exercises and then wanting to sit up on EOB. Required less assist to come to sitting. Refused to don Rt BKA prosthesis. While performing seated exercises he urgently stated pan! Agreed he needed the bedpan and returned to supine and placed on bedpan. RN aware pt has call bell, but may fall asleep and will need to be checked on.     If plan is discharge home, recommend the following: A lot of help with bathing/dressing/bathroom;Direct supervision/assist for medications management;Direct supervision/assist for financial management;Assist for transportation;Assistance with cooking/housework;Supervision due to cognitive status;Two people to help with walking and/or transfers   Can travel by private vehicle     No  Equipment Recommendations  None recommended by PT    Recommendations for Other Services       Precautions / Restrictions Precautions Precautions: Fall;Other (comment) Precaution Comments: R BKA with prosthetic (in room), Monitor HR/BP Restrictions Weight Bearing Restrictions Per Provider Order: No     Mobility  Bed Mobility Overal bed mobility: Needs Assistance Bed Mobility: Rolling, Sidelying to Sit, Sit to Supine Rolling: Supervision, Used rails Sidelying to sit: Min assist, Used rails   Sit to supine: Contact guard assist, Used rails   General bed  mobility comments: pt sleeping on arrival; surprised to hear it was 1:00 pm. Remained lethargic with difficulty understanding what he was saying. Asked for pan and assisted onto bedpan    Transfers                        Ambulation/Gait                   Stairs             Wheelchair Mobility     Tilt Bed    Modified Rankin (Stroke Patients Only)       Balance Overall balance assessment: Needs assistance Sitting-balance support: Feet supported, Single extremity supported Sitting balance-Leahy Scale: Poor Sitting balance - Comments: initially min assist for static sitting with rt hand slipping off EOB; Improved to CGA as well as UE support                                    Cognition Arousal: Alert Behavior During Therapy: Flat affect Overall Cognitive Status: Difficult to assess                                          Exercises General Exercises - Lower Extremity Ankle Circles/Pumps: AAROM, Left, 10 reps Long Arc Quad: AROM, Left, 5 reps Heel Slides: AROM, Left, 5 reps    General Comments        Pertinent Vitals/Pain Pain Assessment Pain Assessment: Faces Faces  Pain Scale: Hurts a little bit Pain Location: left foot Pain Descriptors / Indicators: Guarding Pain Intervention(s): Limited activity within patient's tolerance, Monitored during session    Home Living                          Prior Function            PT Goals (current goals can now be found in the care plan section) Acute Rehab PT Goals Patient Stated Goal: not stated Time For Goal Achievement: 05/28/23 Potential to Achieve Goals: Fair Progress towards PT goals: Progressing toward goals    Frequency    Min 1X/week      PT Plan      Co-evaluation              AM-PAC PT 6 Clicks Mobility   Outcome Measure  Help needed turning from your back to your side while in a flat bed without using bedrails?: A  Little Help needed moving from lying on your back to sitting on the side of a flat bed without using bedrails?: A Little Help needed moving to and from a bed to a chair (including a wheelchair)?: Total Help needed standing up from a chair using your arms (e.g., wheelchair or bedside chair)?: Total Help needed to walk in hospital room?: Total Help needed climbing 3-5 steps with a railing? : Total 6 Click Score: 10    End of Session   Activity Tolerance: Treatment limited secondary to medical complications (Comment);Patient limited by lethargy (need to use bedpan) Patient left: with call bell/phone within reach;in bed;with bed alarm set;Other (comment) (on bedpan) Nurse Communication: Other (comment) (on bedpan) PT Visit Diagnosis: Other abnormalities of gait and mobility (R26.89);Muscle weakness (generalized) (M62.81);Unsteadiness on feet (R26.81);Other symptoms and signs involving the nervous system (R29.898)     Time: 8697-8677 PT Time Calculation (min) (ACUTE ONLY): 20 min  Charges:    $Therapeutic Exercise: 8-22 mins PT General Charges $$ ACUTE PT VISIT: 1 Visit                      Macario RAMAN, PT Acute Rehabilitation Services  Office [phone]    Macario SHAUNNA Soja 05/18/2023, 1:33 PM

## 2023-05-18 NOTE — Progress Notes (Signed)
 Approached by Patients Niece Suzen Naegeli, who states she would like a note entered in the chart . She states when she arrived patient had food everywhere, and appears to have been trying to feed himself. She states she in efforts to assist him in feeding, she noticed patient is very week and cannot hold utensils. She would like PT and OT working with her uncle since he is much different from his baseline prior to admission. She would like attending to follow up on her concerns. Messaged Dr. Sherlon to address in am.

## 2023-05-18 NOTE — Plan of Care (Signed)
  Problem: Education: Goal: Knowledge of risk factors and measures for prevention of condition will improve Outcome: Not Progressing   Problem: Coping: Goal: Psychosocial and spiritual needs will be supported Outcome: Not Progressing   Problem: Respiratory: Goal: Will maintain a patent airway Outcome: Progressing

## 2023-05-18 NOTE — Progress Notes (Signed)
 PHARMACY - ANTICOAGULATION CONSULT NOTE  Pharmacy Consult for heparin  to Eliquis  Indication: atrial fibrillation  No Known Allergies  Patient Measurements: Height: 5' 11 (180.3 cm) Weight: 89 kg (196 lb 3.4 oz) IBW/kg (Calculated) : 75.3 Heparin  Dosing Weight: 88.5 kg  Vital Signs: Temp: 98.9 F (37.2 C) (01/09 0825) Temp Source: Oral (01/09 0825) BP: 134/83 (01/09 0825) Pulse Rate: 112 (01/09 0825)  Labs: Recent Labs    05/16/23 0659 05/16/23 1743 05/17/23 0432 05/17/23 1012 05/17/23 2217 05/18/23 0910  HGB 7.9*  --  8.0*  --   --  8.0*  HCT 25.2*  --  24.9*  --   --  24.4*  PLT 269  --  252  --   --  249  APTT 55*   < > 41* 47* 59* 37*  HEPARINUNFRC 0.62  --  0.30  --   --  0.15*  CREATININE 2.67*  --  2.71*  --   --  2.46*   < > = values in this interval not displayed.    Estimated Creatinine Clearance: 27.2 mL/min (A) (by C-G formula based on SCr of 2.46 mg/dL (H)).  Assessment: 77 YOM found unresponsive and seizing for unknown time, at least 2 days. Patient has a history of Afib on Eliquis  PTA.  He was transitioned to IV heparin , then back to Eliquis  on 1/4.  He then had maroon stools and Eliquis  was held on 1/5.  No surgery/endoscopic interventions unless patient has significant life-threatening bleeding per Surgery/GI.    Switching back to lower dose of Eliquis  today with GI bleeding history  Goal of Therapy:  Heparin  level 0.3-0.5 units/ml due to increase bleeding risk aPTT 66-85 seconds Monitor platelets by anticoagulation protocol: Yes   Plan:  Stop heparin  and heparin  labs Switching back to Eliquis  at a lower dose - 2.5 mg po BID  Thank you Olam Monte, PharmD 05/18/2023, 11:02 AM  Please check AMION for all [facility] Pharmacy phone numbers After 10:00 PM, call Main Pharmacy [phone]

## 2023-05-18 NOTE — Plan of Care (Signed)
  Problem: Coping: Goal: Psychosocial and spiritual needs will be supported Outcome: Progressing   Problem: Respiratory: Goal: Will maintain a patent airway Outcome: Progressing Goal: Complications related to the disease process, condition or treatment will be avoided or minimized Outcome: Progressing   Problem: Education: Goal: Knowledge of General Education information will improve Description: Including pain rating scale, medication(s)/side effects and non-pharmacologic comfort measures Outcome: Progressing   Problem: Health Behavior/Discharge Planning: Goal: Ability to manage health-related needs will improve Outcome: Progressing

## 2023-05-18 NOTE — TOC Progression Note (Signed)
 Transition of Care [facility]) - Progression Note    Patient Details  Name: Zaylin Runco MRN: [id number] Date of Birth: 1945/06/26  [facility] CM/SW Contact  Inocente GORMAN Kindle, LCSW Phone Number: 05/18/2023, 11:53 AM  Clinical Narrative:    11:53 AM-CSW left voicemail for patient's niece to discuss SNF choice. Patient to come off of COVID isolation after tomorrow.    Expected Discharge Plan: Skilled Nursing Facility Barriers to Discharge: Continued Medical Work up, English As A Second Language Teacher, SNF Pending bed offer  Expected Discharge Plan and Services In-house Referral: Clinical Social Work   Post Acute Care Choice: Skilled Nursing Facility Living arrangements for the past 2 months: Single Family Home                                       Social Determinants of Health (SDOH) Interventions SDOH Screenings   Food Insecurity: Patient Unable To Answer (05/10/2023)  Housing: Patient Unable To Answer (05/10/2023)  Transportation Needs: Patient Unable To Answer (05/10/2023)  Utilities: Patient Unable To Answer (05/10/2023)  Social Connections: Patient Unable To Answer (05/10/2023)  Tobacco Use: Low Risk  (05/09/2023)    Readmission Risk Interventions     No data to display

## 2023-05-19 DIAGNOSIS — R4182 Altered mental status, unspecified: Secondary | ICD-10-CM | POA: Diagnosis not present

## 2023-05-19 LAB — CBC WITH DIFFERENTIAL/PLATELET
Abs Immature Granulocytes: 0.04 10*3/uL (ref 0.00–0.07)
Basophils Absolute: 0 10*3/uL (ref 0.0–0.1)
Basophils Relative: 0 %
Eosinophils Absolute: 0.1 10*3/uL (ref 0.0–0.5)
Eosinophils Relative: 1 %
HCT: 26.2 % — ABNORMAL LOW (ref 39.0–52.0)
Hemoglobin: 8.2 g/dL — ABNORMAL LOW (ref 13.0–17.0)
Immature Granulocytes: 1 %
Lymphocytes Relative: 28 %
Lymphs Abs: 1.4 10*3/uL (ref 0.7–4.0)
MCH: 24.2 pg — ABNORMAL LOW (ref 26.0–34.0)
MCHC: 31.3 g/dL (ref 30.0–36.0)
MCV: 77.3 fL — ABNORMAL LOW (ref 80.0–100.0)
Monocytes Absolute: 0.7 10*3/uL (ref 0.1–1.0)
Monocytes Relative: 13 %
Neutro Abs: 2.9 10*3/uL (ref 1.7–7.7)
Neutrophils Relative %: 57 %
Platelets: 245 10*3/uL (ref 150–400)
RBC: 3.39 MIL/uL — ABNORMAL LOW (ref 4.22–5.81)
RDW: 16.6 % — ABNORMAL HIGH (ref 11.5–15.5)
WBC: 5.1 10*3/uL (ref 4.0–10.5)
nRBC: 0 % (ref 0.0–0.2)

## 2023-05-19 LAB — BPAM RBC
Blood Product Expiration Date: 202502072359
ISSUE DATE / TIME: 202501091528
Unit Type and Rh: 5100

## 2023-05-19 LAB — BASIC METABOLIC PANEL
Anion gap: 11 (ref 5–15)
BUN: 27 mg/dL — ABNORMAL HIGH (ref 8–23)
CO2: 21 mmol/L — ABNORMAL LOW (ref 22–32)
Calcium: 7.7 mg/dL — ABNORMAL LOW (ref 8.9–10.3)
Chloride: 108 mmol/L (ref 98–111)
Creatinine, Ser: 2.29 mg/dL — ABNORMAL HIGH (ref 0.61–1.24)
GFR, Estimated: 29 mL/min — ABNORMAL LOW (ref >60)
Glucose, Bld: 223 mg/dL — ABNORMAL HIGH (ref 70–99)
Potassium: 4.2 mmol/L (ref 3.5–5.1)
Sodium: 140 mmol/L (ref 135–145)

## 2023-05-19 LAB — TYPE AND SCREEN
ABO/RH(D): O POS
Antibody Screen: NEGATIVE
Unit division: 0

## 2023-05-19 LAB — GLUCOSE, CAPILLARY
Glucose-Capillary: 195 mg/dL — ABNORMAL HIGH (ref 70–99)
Glucose-Capillary: 305 mg/dL — ABNORMAL HIGH (ref 70–99)
Glucose-Capillary: 243 mg/dL — ABNORMAL HIGH (ref 70–99)
Glucose-Capillary: 287 mg/dL — ABNORMAL HIGH (ref 70–99)

## 2023-05-19 MED ORDER — SODIUM CHLORIDE 0.9 % IV SOLN
400.0000 mg | INTRAVENOUS | Status: AC
  Administered 2023-05-19 – 2023-05-21 (×3): 400 mg via INTRAVENOUS
  Filled 2023-05-19 (×3): qty 20

## 2023-05-19 MED ORDER — IRON SUCROSE 400 MG IVPB - SIMPLE MED
400.0000 mg | Status: DC
  Filled 2023-05-19: qty 270

## 2023-05-19 MED ORDER — VITAMIN B-12 1000 MCG PO TABS
500.0000 ug | ORAL_TABLET | Freq: Every day | ORAL | Status: DC
  Administered 2023-05-19 – 2023-05-22 (×4): 500 ug via ORAL
  Filled 2023-05-19 (×4): qty 1

## 2023-05-19 MED ORDER — INSULIN GLARGINE-YFGN 100 UNIT/ML ~~LOC~~ SOLN
5.0000 [IU] | Freq: Every day | SUBCUTANEOUS | Status: DC
Start: 2023-05-20 — End: 2023-05-20
  Administered 2023-05-20: 5 [IU] via SUBCUTANEOUS
  Filled 2023-05-19: qty 0.05

## 2023-05-19 NOTE — Plan of Care (Signed)
   Problem: Education: Goal: Knowledge of risk factors and measures for prevention of condition will improve Outcome: Progressing   Problem: Coping: Goal: Psychosocial and spiritual needs will be supported Outcome: Progressing   Problem: Respiratory: Goal: Will maintain a patent airway Outcome: Progressing Goal: Complications related to the disease process, condition or treatment will be avoided or minimized Outcome: Progressing

## 2023-05-19 NOTE — TOC Progression Note (Addendum)
 Transition of Care [facility]) - Progression Note    Patient Details  Name: Cory [name] MRN: [id number] Date of Birth: 10/15/1945  [facility] CM/SW Contact  Inocente GORMAN Kindle, LCSW Phone Number: 05/19/2023, 10:06 AM  Clinical Narrative:    10:06 AM-CSW left another voicemail for patient's niece, [name].   11:37 AM-CSW received notification from prior unit CSW that niece, [name], emailed her ([email or web address]) and requested [facility]. CSW awaiting bed availability info from [facility].   1:30 PM-[facility] able to accept patient over weekend if stable. CSW received insurance approval, Ref# [id number], effective 05/20/2023-05/23/2023.    Expected Discharge Plan: Skilled Nursing Facility Barriers to Discharge: Continued Medical Work up, English As A Second Language Teacher, SNF Pending bed offer  Expected Discharge Plan and Services In-house Referral: Clinical Social Work   Post Acute Care Choice: Skilled Nursing Facility Living arrangements for the past 2 months: Single Family Home                                       Social Determinants of Health (SDOH) Interventions SDOH Screenings   Food Insecurity: Patient Unable To Answer (05/10/2023)  Housing: Patient Unable To Answer (05/10/2023)  Transportation Needs: Patient Unable To Answer (05/10/2023)  Utilities: Patient Unable To Answer (05/10/2023)  Social Connections: Patient Unable To Answer (05/10/2023)  Tobacco Use: Low Risk  (05/09/2023)    Readmission Risk Interventions     No data to display

## 2023-05-19 NOTE — Plan of Care (Signed)
 Patient family educated

## 2023-05-19 NOTE — Progress Notes (Signed)
 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 Per ED documentation, family had not heard from patient for the past two days. EMS was called out to patient's home by family and patient was found unresponsive and actively seizing at the side of the patient's bed. Patient's pupils were unequal with right deviation. Upon arrival to ED, patient was obtunded, CBG reading high, and on a 100% NRB. Patient was intubated by ED physician and PCCM consulted for further management.   12/31 Found unresponsive, with active seizures per EMS. Intubated upon arrival to ED  > Intubated, minimal sedation -COVID-positive Arterial line, Right IJ central line, Foley catheter.  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  Several chronic microhemorrhages and supratentorial brain 01/01 EEG with severe diffuse encephalopathy 01/02 renal function worsening, neuro consult with mentation not improving 01/03 mentation improved, following commands, overnight EEG without seizures 1/4 extubated  Assessment & Plan:   Principal Problem:   AMS (altered mental status) Active Problems:   Septic shock (HCC)   Acute hypoxic respiratory failure COVID-positive UTI Severe sepsis, present on admission -Required intubation upon admission, successfully extubated, he is currently on room air -finished 10 days isolation for COVID-19 infection. -Treated 5 days of IV Rocephin , urine culture growing E. coli. 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  Hyperosmolar nonketotic state on admission Diabetes mellitus, type II, uncontrolled -Home med Lantus  26 units lispro to 10 units - DKA has resolved -Patient with labile diabetes mellitus, he had few low readings over the last few days, so has not been started on long-acting insulin , but currently persistently hyperglycemic, he is back on low-dose Semglee , will increase today., continue with insulin  sliding scale as well. -Keep on carb modified diet.  Colon cancer not operated on  Rectal bleeding with known proximal descending colon mass, positive on PET scan Anemia, multifactorial, anemia of chronic kidney disease, iron  deficiency anemia and blood loss anemia, -Surgery input greatly appreciated, recommendation for conservative management currently, and to avoid surgical intervention this hospitalization, till he is more medically stable, where he can follow primary surgeon at [facility] as an outpatient, unless he is having significant bleeding, - GI input greatly appreciated , no plan for endoscopic procedure given its known to be secondary to mass. -Recommendation is for conservative management, and to avoid surgical intervention if bleeding has stabilized -CBC closely and transfuse as needed, hemoglobin of 8.2 this morning after 1 unit PRBC transfusion overnight.   -Ferritin and iron  borderline, will start IV iron .   AKI on ckd stage 3b Rhabdomyolysis - Baseline creatinine less than 2.  Peak creatinine at 5.3.  Creatinine trending down.  -  Foley remains in place.  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 Urinary retention Present on admission. 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  DVT prophylaxis: Heparin  gtt >> Eliquis  Code Status: (Full) Family Communication: D/W niece [name] by phone/9 Disposition:   Status is: Inpatient    Consultants:  PCCM GI General surgery neurolgy   Subjective:  No significant events overnight, he denies any complaints today  Objective: Vitals:   05/19/23 0000 05/19/23 0334 05/19/23 0337 05/19/23 0800  BP: 133/73 (!) 146/91  138/71  Pulse: 79   67  Resp: 17   13  Temp: 98.1 F (36.7 C) 98.2 F (36.8 C)  98.1 F (36.7 C)  TempSrc: Oral Oral  Oral  SpO2: 97%   98%  Weight:   88.9 kg   Height:        Intake/Output Summary (Last 24 hours) at 05/19/2023 1021 Last data filed at 05/19/2023 0900 Gross per 24 hour  Intake 767.5 ml  Output 3725 ml  Net -2957.5 ml   Filed Weights   05/17/23 0500 05/18/23 0500 05/19/23 0337  Weight: 90.5 kg 89 kg 88.9 kg    Examination:  Awake Alert, negative, pleasant, mildly pleasantly confused in no apparent distress, Foley present Symmetrical Chest wall movement, Good air movement bilaterally, CTAB RRR,No Gallops,Rubs or new Murmurs, No Parasternal Heave +ve B.Sounds, Abd Soft, No tenderness, No rebound - guarding or rigidity. No Cyanosis, Clubbing ,right BKA     Data Reviewed: I have personally reviewed following labs and imaging studies  CBC: Recent  Labs  Lab 05/15/23 0643 05/16/23 0659 05/17/23 0432 05/18/23 0910 05/19/23 0529  WBC 5.7 5.1 4.8 4.4 5.1  NEUTROABS  --  2.4 2.4 2.8 2.9  HGB 8.1* 7.9* 8.0* 8.0* 8.2*  HCT 26.0* 25.2* 24.9* 24.4* 26.2*  MCV 76.0* 75.7* 75.5* 74.6* 77.3*  PLT 237 269 252 249 245    Basic Metabolic Panel: Recent Labs  Lab 05/13/23 0444 05/14/23 0505 05/15/23 0643 05/16/23 0659 05/17/23 0432 05/18/23 0910 05/19/23 0529  NA 149* 151* 145 145 138 139 140  K 3.5 3.7 4.2 3.5 4.0 4.0 4.2  CL 112* 113* 110 110 106 108 108  CO2 26 27 24 26 25 24  21*  GLUCOSE 135* 85 217* 51* 275* 245* 223*  BUN 66* 53* 49* 37* 36* 29* 27*  CREATININE 4.25* 3.58* 3.07* 2.67* 2.71* 2.46* 2.29*  CALCIUM  8.3* 8.3* 8.2* 8.1* 7.5* 7.8* 7.7*  MG 2.5* 2.3  --   --   --   --   --   PHOS 4.6 3.5  --   --   --   --   --     GFR: Estimated Creatinine Clearance: 29.2 mL/min (A) (by C-G formula based on SCr of 2.29 mg/dL (H)).  Liver Function Tests: Recent Labs  Lab 05/17/23 0432  AST 34  ALT 40  ALKPHOS 52  BILITOT 0.7  PROT 5.7*  ALBUMIN  1.9*    CBG: Recent Labs  Lab 05/18/23 0823 05/18/23 1203 05/18/23 1636  05/18/23 2114 05/19/23 0835  GLUCAP 240* 168* 118* 292* 195*     Recent Results (from the past 240 hours)  Culture, blood (Routine x 2)     Status: None   Collection Time: 05/09/23 10:40 AM   Specimen: BLOOD LEFT ARM  Result Value Ref Range Status   Specimen Description BLOOD LEFT ARM  Final   Special Requests   Final    BOTTLES DRAWN AEROBIC AND ANAEROBIC Blood Culture adequate volume   Culture   Final    NO GROWTH 5 DAYS Performed at [facility], [address] [place], [place] [address]    Report Status 05/14/2023 FINAL  Final  Resp panel by RT-PCR (RSV, Flu A&B, Covid) Anterior Nasal Swab     Status: Abnormal   Collection Time: 05/09/23 10:40 AM   Specimen: Anterior Nasal Swab  Result Value Ref Range Status   SARS Coronavirus 2 by RT PCR POSITIVE (A) NEGATIVE Final    Influenza A by PCR NEGATIVE NEGATIVE Final   Influenza B by PCR NEGATIVE NEGATIVE Final    Comment: (NOTE) The Xpert Xpress SARS-CoV-2/FLU/RSV plus assay is intended as an aid in the diagnosis of influenza from Nasopharyngeal swab specimens and should not be used as a sole basis for treatment. Nasal washings and aspirates are unacceptable for Xpert Xpress SARS-CoV-2/FLU/RSV testing.  Fact Sheet for Patients: bloggercourse.com  Fact Sheet for Healthcare Providers: seriousbroker.it  This test is not yet approved or cleared by the [place]  FDA and has been authorized for detection and/or diagnosis of SARS-CoV-2 by FDA under an Emergency Use Authorization (EUA). This EUA will remain in effect (meaning this test can be used) for the duration of the COVID-19 declaration under Section 564(b)(1) of the Act, 21 U.S.C. section 360bbb-3(b)(1), unless the authorization is terminated or revoked.     Resp Syncytial Virus by PCR NEGATIVE NEGATIVE Final    Comment: (NOTE) Fact Sheet for Patients: bloggercourse.com  Fact Sheet for Healthcare Providers: seriousbroker.it  This test is not yet approved or cleared by the [place]  FDA and has been authorized for detection and/or diagnosis of SARS-CoV-2 by FDA under an Emergency Use Authorization (EUA). This EUA will remain in effect (meaning this test can be used) for the duration of the COVID-19 declaration under Section 564(b)(1) of the Act, 21 U.S.C. section 360bbb-3(b)(1), unless the authorization is terminated or revoked.  Performed at [facility], [address] [place], [place] [address]   Culture, blood (Routine x 2)     Status: None   Collection Time: 05/09/23 10:45 AM   Specimen: BLOOD  Result Value Ref Range Status   Specimen Description BLOOD RIGHT ANTECUBITAL  Final   Special Requests   Final    BOTTLES DRAWN  AEROBIC AND ANAEROBIC Blood Culture adequate volume   Culture   Final    NO GROWTH 5 DAYS Performed at [facility], [address] [place], [place] [address]    Report Status 05/14/2023 FINAL  Final  Respiratory (~20 pathogens) panel by PCR     Status: None   Collection Time: 05/09/23  1:53 PM   Specimen: Nasopharyngeal Swab; Respiratory  Result Value Ref Range Status   Adenovirus NOT DETECTED NOT DETECTED Final   Coronavirus 229E NOT DETECTED NOT DETECTED Final    Comment: (NOTE) The Coronavirus on the Respiratory Panel, DOES NOT test for the novel  Coronavirus (2019 nCoV)    Coronavirus HKU1 NOT DETECTED NOT DETECTED Final  Coronavirus NL63 NOT DETECTED NOT DETECTED Final   Coronavirus OC43 NOT DETECTED NOT DETECTED Final   Metapneumovirus NOT DETECTED NOT DETECTED Final   Rhinovirus / Enterovirus NOT DETECTED NOT DETECTED Final   Influenza A NOT DETECTED NOT DETECTED Final   Influenza B NOT DETECTED NOT DETECTED Final   Parainfluenza Virus 1 NOT DETECTED NOT DETECTED Final   Parainfluenza Virus 2 NOT DETECTED NOT DETECTED Final   Parainfluenza Virus 3 NOT DETECTED NOT DETECTED Final   Parainfluenza Virus 4 NOT DETECTED NOT DETECTED Final   Respiratory Syncytial Virus NOT DETECTED NOT DETECTED Final   Bordetella pertussis NOT DETECTED NOT DETECTED Final   Bordetella Parapertussis NOT DETECTED NOT DETECTED Final   Chlamydophila pneumoniae NOT DETECTED NOT DETECTED Final   Mycoplasma pneumoniae NOT DETECTED NOT DETECTED Final    Comment: Performed at [facility], [address], [place], [place] [address]  MRSA Next Gen by PCR, Nasal     Status: None   Collection Time: 05/09/23  3:18 PM   Specimen: Nasal Mucosa; Nasal Swab  Result Value Ref Range Status   MRSA by PCR Next Gen NOT DETECTED NOT DETECTED Final    Comment: (NOTE) The GeneXpert MRSA Assay (FDA approved for NASAL specimens only), is one component of a comprehensive MRSA colonization  surveillance program. It is not intended to diagnose MRSA infection nor to guide or monitor treatment for MRSA infections. Test performance is not FDA approved in patients less than 77 years old. Performed at [facility], [address], [place], [place] [address]   Urine Culture (for pregnant, neutropenic or urologic patients or patients with an indwelling urinary catheter)     Status: Abnormal   Collection Time: 05/11/23  4:50 PM   Specimen: Urine, Catheterized  Result Value Ref Range Status   Specimen Description URINE, CATHETERIZED  Final   Special Requests   Final    NONE Performed at [facility], [address] [place], [place] [address]    Culture (A)  Final    20,000 COLONIES/mL ESCHERICHIA COLI Confirmed Extended Spectrum Beta-Lactamase Producer (ESBL).  In bloodstream infections from ESBL organisms, carbapenems are preferred over piperacillin /tazobactam. They are shown to have a lower risk of mortality.    Report Status 05/13/2023 FINAL  Final   Organism ID, Bacteria ESCHERICHIA COLI (A)  Final      Susceptibility   Escherichia coli - MIC*    AMPICILLIN  >=32 RESISTANT Resistant     CEFAZOLIN  >=64 RESISTANT Resistant     CEFEPIME  >=32 RESISTANT Resistant     CEFTRIAXONE  >=64 RESISTANT Resistant     CIPROFLOXACIN >=4 RESISTANT Resistant     GENTAMICIN <=1 SENSITIVE Sensitive     IMIPENEM <=0.25 SENSITIVE Sensitive     NITROFURANTOIN <=16 SENSITIVE Sensitive     TRIMETH /SULFA  >=320 RESISTANT Resistant     AMPICILLIN /SULBACTAM >=32 RESISTANT Resistant     PIP/TAZO <=4 SENSITIVE Sensitive ug/mL    * 20,000 COLONIES/mL ESCHERICHIA COLI         Radiology Studies: No results found.      Scheduled Meds:  apixaban   2.5 mg Oral BID   carvedilol   25 mg Oral BID WC   Chlorhexidine  Gluconate Cloth  6 each Topical Daily   vitamin B-12  500 mcg Oral Daily   docusate sodium   100 mg Oral BID   feeding supplement  237 mL Oral TID BM   folic acid   1 mg  Oral Daily   insulin   aspart  0-5 Units Subcutaneous QHS   insulin  aspart  0-9 Units Subcutaneous TID WC   [START ON 05/20/2023] insulin  glargine-yfgn  5 Units Subcutaneous Daily   multivitamin with minerals  1 tablet Oral Daily   pantoprazole   40 mg Oral BID   polyethylene glycol  17 g Oral Daily   senna  2 tablet Oral QHS   tamsulosin   0.4 mg Oral Daily   Continuous Infusions:  iron  sucrose        LOS: 10 days       Brayton Lye, MD [facility]   To contact the attending provider between 7A-7P or the covering provider during after hours 7P-7A, please log into the web site [email or web address] and access using universal [place] password for that web site. If you do not have the password, please call the hospital operator.  05/19/2023, 10:21 AM

## 2023-05-20 DIAGNOSIS — A419 Sepsis, unspecified organism: Secondary | ICD-10-CM | POA: Diagnosis not present

## 2023-05-20 DIAGNOSIS — R6521 Severe sepsis with septic shock: Secondary | ICD-10-CM | POA: Diagnosis not present

## 2023-05-20 LAB — GLUCOSE, CAPILLARY
Glucose-Capillary: 194 mg/dL — ABNORMAL HIGH (ref 70–99)
Glucose-Capillary: 304 mg/dL — ABNORMAL HIGH (ref 70–99)
Glucose-Capillary: 320 mg/dL — ABNORMAL HIGH (ref 70–99)
Glucose-Capillary: 98 mg/dL (ref 70–99)

## 2023-05-20 LAB — CBC WITH DIFFERENTIAL/PLATELET
Abs Immature Granulocytes: 0.03 10*3/uL (ref 0.00–0.07)
Basophils Absolute: 0 10*3/uL (ref 0.0–0.1)
Basophils Relative: 0 %
Eosinophils Absolute: 0.1 10*3/uL (ref 0.0–0.5)
Eosinophils Relative: 2 %
HCT: 27.5 % — ABNORMAL LOW (ref 39.0–52.0)
Hemoglobin: 8.9 g/dL — ABNORMAL LOW (ref 13.0–17.0)
Immature Granulocytes: 1 %
Lymphocytes Relative: 26 %
Lymphs Abs: 1.5 10*3/uL (ref 0.7–4.0)
MCH: 24.7 pg — ABNORMAL LOW (ref 26.0–34.0)
MCHC: 32.4 g/dL (ref 30.0–36.0)
MCV: 76.4 fL — ABNORMAL LOW (ref 80.0–100.0)
Monocytes Absolute: 0.5 10*3/uL (ref 0.1–1.0)
Monocytes Relative: 9 %
Neutro Abs: 3.6 10*3/uL (ref 1.7–7.7)
Neutrophils Relative %: 62 %
Platelets: 270 10*3/uL (ref 150–400)
RBC: 3.6 MIL/uL — ABNORMAL LOW (ref 4.22–5.81)
RDW: 17 % — ABNORMAL HIGH (ref 11.5–15.5)
WBC: 5.8 10*3/uL (ref 4.0–10.5)
nRBC: 0 % (ref 0.0–0.2)

## 2023-05-20 LAB — BASIC METABOLIC PANEL
Anion gap: 8 (ref 5–15)
BUN: 24 mg/dL — ABNORMAL HIGH (ref 8–23)
CO2: 21 mmol/L — ABNORMAL LOW (ref 22–32)
Calcium: 7.9 mg/dL — ABNORMAL LOW (ref 8.9–10.3)
Chloride: 109 mmol/L (ref 98–111)
Creatinine, Ser: 2.04 mg/dL — ABNORMAL HIGH (ref 0.61–1.24)
GFR, Estimated: 33 mL/min — ABNORMAL LOW (ref >60)
Glucose, Bld: 198 mg/dL — ABNORMAL HIGH (ref 70–99)
Potassium: 3.9 mmol/L (ref 3.5–5.1)
Sodium: 138 mmol/L (ref 135–145)

## 2023-05-20 MED ORDER — AMLODIPINE BESYLATE 5 MG PO TABS
5.0000 mg | ORAL_TABLET | Freq: Once | ORAL | Status: AC
  Administered 2023-05-20: 5 mg via ORAL
  Filled 2023-05-20: qty 1

## 2023-05-20 MED ORDER — ORAL CARE MOUTH RINSE: 15.0000 mL | OROMUCOSAL | Status: DC | PRN

## 2023-05-20 MED ORDER — INSULIN GLARGINE-YFGN 100 UNIT/ML ~~LOC~~ SOLN
8.0000 [IU] | Freq: Every day | SUBCUTANEOUS | Status: DC
  Administered 2023-05-21 – 2023-05-22 (×2): 8 [IU] via SUBCUTANEOUS
  Filled 2023-05-20 (×2): qty 0.08

## 2023-05-20 NOTE — Plan of Care (Signed)
  Problem: Coping: Goal: Psychosocial and spiritual needs will be supported Outcome: Progressing   Problem: Respiratory: Goal: Will maintain a patent airway Outcome: Progressing Goal: Complications related to the disease process, condition or treatment will be avoided or minimized Outcome: Progressing   Problem: Education: Goal: Knowledge of General Education information will improve Description: Including pain rating scale, medication(s)/side effects and non-pharmacologic comfort measures Outcome: Progressing   Problem: Health Behavior/Discharge Planning: Goal: Ability to manage health-related needs will improve Outcome: Progressing   Problem: Clinical Measurements: Goal: Ability to maintain clinical measurements within normal limits will improve Outcome: Progressing Goal: Will remain free from infection Outcome: Progressing Goal: Diagnostic test results will improve Outcome: Progressing Goal: Respiratory complications will improve Outcome: Progressing Goal: Cardiovascular complication will be avoided Outcome: Progressing   Problem: Activity: Goal: Risk for activity intolerance will decrease Outcome: Progressing   Problem: Nutrition: Goal: Adequate nutrition will be maintained Outcome: Progressing   Problem: Coping: Goal: Level of anxiety will decrease Outcome: Progressing   Problem: Elimination: Goal: Will not experience complications related to bowel motility Outcome: Progressing Goal: Will not experience complications related to urinary retention Outcome: Progressing   Problem: Pain Management: Goal: General experience of comfort will improve Outcome: Progressing   Problem: Safety: Goal: Ability to remain free from injury will improve Outcome: Progressing   Problem: Skin Integrity: Goal: Risk for impaired skin integrity will decrease Outcome: Progressing   Problem: Safety: Goal: Non-violent Restraint(s) Outcome: Progressing   Problem:  Education: Goal: Ability to describe self-care measures that may prevent or decrease complications (Diabetes Survival Skills Education) will improve Outcome: Progressing Goal: Individualized Educational Video(s) Outcome: Progressing   Problem: Coping: Goal: Ability to adjust to condition or change in health will improve Outcome: Progressing   Problem: Fluid Volume: Goal: Ability to maintain a balanced intake and output will improve Outcome: Progressing   Problem: Health Behavior/Discharge Planning: Goal: Ability to identify and utilize available resources and services will improve Outcome: Progressing Goal: Ability to manage health-related needs will improve Outcome: Progressing   Problem: Metabolic: Goal: Ability to maintain appropriate glucose levels will improve Outcome: Progressing   Problem: Nutritional: Goal: Maintenance of adequate nutrition will improve Outcome: Progressing Goal: Progress toward achieving an optimal weight will improve Outcome: Progressing   Problem: Skin Integrity: Goal: Risk for impaired skin integrity will decrease Outcome: Progressing   Problem: Tissue Perfusion: Goal: Adequacy of tissue perfusion will improve Outcome: Progressing

## 2023-05-20 NOTE — Progress Notes (Signed)
 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 Per ED documentation, family had not heard from patient for the past two days. EMS was called out to patient's home by family and patient was found unresponsive and actively seizing at the side of the patient's bed. Patient's pupils were unequal with right deviation. Upon arrival to ED, patient was obtunded, CBG reading high, and on a 100% NRB. Patient was intubated by ED physician and PCCM consulted for further management.   12/31 Found unresponsive, with active seizures per EMS. Intubated upon arrival to ED  > Intubated, minimal sedation -COVID-positive Arterial line, Right IJ central line, Foley catheter.  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  Several chronic microhemorrhages and supratentorial brain 01/01 EEG with severe diffuse encephalopathy 01/02 renal function worsening, neuro consult with mentation not improving 01/03 mentation improved, following commands, overnight EEG without seizures 1/4 extubated  Assessment & Plan:   Principal Problem:   AMS (altered mental status) Active Problems:   Septic shock (HCC)   Acute hypoxic respiratory failure COVID-positive UTI Severe sepsis, present on admission -Required intubation upon admission, successfully extubated, he is currently on room air -finished 10 days isolation for COVID-19 infection. -Treated 5 days of IV Rocephin , urine culture growing E. coli. 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  Hyperosmolar nonketotic state on admission Diabetes mellitus, type II, uncontrolled -Home med Lantus  26 units lispro to 10 units - DKA has resolved -Patient with labile diabetes mellitus, he had few low readings over the last few days, so has not been started on long-acting insulin , but currently persistently hyperglycemic, he is back on low-dose Semglee , CBG on the higher side currently, so we will increase Semglee  to 8 units daily, continue with insulin  sliding scale as well  -Keep on carb modified diet.  Colon cancer not operated on  Rectal bleeding with known proximal descending colon mass, positive on PET scan Anemia, multifactorial, anemia of chronic kidney disease, iron  deficiency anemia and blood loss anemia, -Surgery input greatly appreciated, recommendation for conservative management currently, and to avoid surgical intervention this hospitalization, till he is more medically stable, where he can follow primary surgeon at [facility] as an outpatient, unless he is having significant bleeding, - GI input greatly appreciated , no plan for endoscopic procedure given its known to be secondary to mass. -Recommendation is for conservative management, and to avoid surgical intervention if bleeding has stabilized -CBC closely and transfuse as needed, for required 2 units PRBC transfusion during hospital stay, hemoglobin has been stable overall . -No further evidence of GI bleed now he is back on Eliquis , good BM yesterday did not have melena or bright red blood  per rectum. -Ferritin and  iron  borderline, on IV iron .   AKI on ckd stage 3b Rhabdomyolysis - Baseline creatinine less than 2.  Creatinine peaked at 5.3.  Creatinine trending down.  -Voiding trial 1/10, monitor for retention   Hypernatremia -Resolved    A-flutter CHADVASC >3 on Eliquis  -Resumed Coreg  12.5 twice daily--Keep on monitors -He is on heparin  GTT, hemoglobin has been stable for last few days, with no evidence of recurrent GI bleed, will start on Quist, will lower dose to 2.5 mg twice daily given worsening renal function.   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 Urinary retention Present on admission. 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  DVT prophylaxis: Heparin  gtt >> Eliquis  Code Status: (Full) Family Communication: D/W niece [name] by phone1/9 Disposition:   Status is: Inpatient    Consultants:  PCCM GI General surgery neurolgy   Subjective:  Patient denies any complaints today, cussed with staff, he had a  large BM yesterday, light brown in color, no melena or bleed. Objective: Vitals:   05/19/23 1536 05/19/23 1600 05/19/23 1958 05/20/23 0538  BP: (!) 160/89 (!) 148/87 137/71 (!) 151/86  Pulse: 68 71 69 80  Resp: 18 20 20 17   Temp: 98.6 F (37 C)  98.2 F (36.8 C) 98 F (36.7 C)  TempSrc: Axillary  Oral Oral  SpO2: 98% 98% 96% 100%  Weight:      Height:        Intake/Output Summary (Last 24 hours) at 05/20/2023 1158 Last data filed at 05/20/2023 1124 Gross per 24 hour  Intake 1230 ml  Output 300 ml  Net 930 ml   Filed Weights   05/17/23 0500 05/18/23 0500 05/19/23 0337  Weight: 90.5 kg 89 kg 88.9 kg    Examination:  Awake, alert, oriented x 2, pleasant, communicative, mildly impaired cognition and insight, but is answering most of questions appropriately Symmetrical Chest  wall movement, Good air movement bilaterally, CTAB RRR,No Gallops,Rubs or new Murmurs, No Parasternal Heave +ve B.Sounds, Abd Soft, No tenderness, No rebound - guarding or rigidity. No Cyanosis, Clubbing or edema, right BKA.    Data Reviewed: I have personally reviewed following labs and imaging studies  CBC: Recent Labs  Lab 05/16/23 0659 05/17/23 0432 05/18/23 0910 05/19/23 0529 05/20/23 0523  WBC 5.1 4.8 4.4 5.1 5.8  NEUTROABS 2.4 2.4 2.8 2.9 3.6  HGB 7.9* 8.0* 8.0* 8.2* 8.9*  HCT 25.2* 24.9* 24.4* 26.2* 27.5*  MCV 75.7* 75.5* 74.6* 77.3* 76.4*  PLT 269 252 249 245 270    Basic Metabolic Panel: Recent Labs  Lab 05/14/23 0505 05/15/23 0643 05/16/23 0659 05/17/23 0432 05/18/23 0910 05/19/23 0529 05/20/23 0523  NA 151*   < > 145 138 139 140 138  K 3.7   < > 3.5 4.0 4.0 4.2 3.9  CL 113*   < > 110 106 108 108 109  CO2 27   < > 26 25 24  21* 21*  GLUCOSE 85   < > 51* 275* 245* 223* 198*  BUN 53*   < > 37* 36* 29* 27* 24*  CREATININE 3.58*   < > 2.67* 2.71* 2.46* 2.29* 2.04*  CALCIUM  8.3*   < > 8.1* 7.5* 7.8* 7.7* 7.9*  MG 2.3  --   --   --   --   --   --  PHOS 3.5  --   --   --   --   --   --    < > = values in this interval not displayed.    GFR: Estimated Creatinine Clearance: 32.8 mL/min (A) (by C-G formula based on SCr of 2.04 mg/dL (H)).  Liver Function Tests: Recent Labs  Lab 05/17/23 0432  AST 34  ALT 40  ALKPHOS 52  BILITOT 0.7  PROT 5.7*  ALBUMIN  1.9*    CBG: Recent Labs  Lab 05/19/23 0835 05/19/23 1241 05/19/23 1535 05/19/23 1959 05/20/23 0816  GLUCAP 195* 305* 243* 287* 194*     Recent Results (from the past 240 hours)  Urine Culture (for pregnant, neutropenic or urologic patients or patients with an indwelling urinary catheter)     Status: Abnormal   Collection Time: 05/11/23  4:50 PM   Specimen: Urine, Catheterized  Result Value Ref Range Status   Specimen Description URINE, CATHETERIZED  Final   Special Requests   Final     NONE Performed at [facility], [address], [place], [place] [address]    Culture (A)  Final    20,000 COLONIES/mL ESCHERICHIA COLI Confirmed Extended Spectrum Beta-Lactamase Producer (ESBL).  In bloodstream infections from ESBL organisms, carbapenems are preferred over piperacillin /tazobactam. They are shown to have a lower risk of mortality.    Report Status 05/13/2023 FINAL  Final   Organism ID, Bacteria ESCHERICHIA COLI (A)  Final      Susceptibility   Escherichia coli - MIC*    AMPICILLIN  >=32 RESISTANT Resistant     CEFAZOLIN  >=64 RESISTANT Resistant     CEFEPIME  >=32 RESISTANT Resistant     CEFTRIAXONE  >=64 RESISTANT Resistant     CIPROFLOXACIN >=4 RESISTANT Resistant     GENTAMICIN <=1 SENSITIVE Sensitive     IMIPENEM <=0.25 SENSITIVE Sensitive     NITROFURANTOIN <=16 SENSITIVE Sensitive     TRIMETH /SULFA  >=320 RESISTANT Resistant     AMPICILLIN /SULBACTAM >=32 RESISTANT Resistant     PIP/TAZO <=4 SENSITIVE Sensitive ug/mL    * 20,000 COLONIES/mL ESCHERICHIA COLI         Radiology Studies: No results found.      Scheduled Meds:  apixaban   2.5 mg Oral BID   carvedilol   25 mg Oral BID WC   Chlorhexidine  Gluconate Cloth  6 each Topical Daily   vitamin B-12  500 mcg Oral Daily   docusate sodium   100 mg Oral BID   feeding supplement  237 mL Oral TID BM   folic acid   1 mg Oral Daily   insulin  aspart  0-5 Units Subcutaneous QHS   insulin  aspart  0-9 Units Subcutaneous TID WC   insulin  glargine-yfgn  5 Units Subcutaneous Daily   multivitamin with minerals  1 tablet Oral Daily   pantoprazole   40 mg Oral BID   polyethylene glycol  17 g Oral Daily   senna  2 tablet Oral QHS   tamsulosin   0.4 mg Oral Daily   Continuous Infusions:  iron  sucrose 108 mL/hr at 05/20/23 1124      LOS: 11 days       Brayton Lye, MD [facility]   To contact the attending provider between 7A-7P or the covering provider during after hours 7P-7A, please log  into the web site [email or web address] and access using universal [place] password for that web site. If you do not have the password, please call the hospital operator.  05/20/2023, 11:58 AM

## 2023-05-20 NOTE — TOC Progression Note (Signed)
 Transition of Care [facility]) - Progression Note    Patient Details  Name: Cory [name] MRN: [id number] Date of Birth: 19-Feb-1946  Transition of Care [facility]) CM/SW Contact  Cena Ligas, [place] Phone Number: 05/20/2023, 12:33 PM  Clinical Narrative:     Per MD, patient should be ready for discharge on Monday to [facility]. Patents shara is active until 05/22/13.   Expected Discharge Plan: Skilled Nursing Facility Barriers to Discharge: Continued Medical Work up, English As A Second Language Teacher, SNF Pending bed offer  Expected Discharge Plan and Services In-house Referral: Clinical Social Work   Post Acute Care Choice: Skilled Nursing Facility Living arrangements for the past 2 months: Single Family Home                                       Social Determinants of Health (SDOH) Interventions SDOH Screenings   Food Insecurity: Patient Unable To Answer (05/10/2023)  Housing: Patient Unable To Answer (05/10/2023)  Transportation Needs: Patient Unable To Answer (05/10/2023)  Utilities: Patient Unable To Answer (05/10/2023)  Social Connections: Patient Unable To Answer (05/10/2023)  Tobacco Use: Low Risk  (05/09/2023)    Readmission Risk Interventions     No data to display

## 2023-05-21 DIAGNOSIS — A419 Sepsis, unspecified organism: Secondary | ICD-10-CM | POA: Diagnosis not present

## 2023-05-21 DIAGNOSIS — G9341 Metabolic encephalopathy: Secondary | ICD-10-CM | POA: Diagnosis not present

## 2023-05-21 DIAGNOSIS — R6520 Severe sepsis without septic shock: Secondary | ICD-10-CM | POA: Diagnosis not present

## 2023-05-21 LAB — CBC WITH DIFFERENTIAL/PLATELET
Abs Immature Granulocytes: 0.03 10*3/uL (ref 0.00–0.07)
Basophils Absolute: 0 10*3/uL (ref 0.0–0.1)
Basophils Relative: 0 %
Eosinophils Absolute: 0.1 10*3/uL (ref 0.0–0.5)
Eosinophils Relative: 2 %
HCT: 26.7 % — ABNORMAL LOW (ref 39.0–52.0)
Hemoglobin: 8.7 g/dL — ABNORMAL LOW (ref 13.0–17.0)
Immature Granulocytes: 1 %
Lymphocytes Relative: 28 %
Lymphs Abs: 1.5 10*3/uL (ref 0.7–4.0)
MCH: 24.8 pg — ABNORMAL LOW (ref 26.0–34.0)
MCHC: 32.6 g/dL (ref 30.0–36.0)
MCV: 76.1 fL — ABNORMAL LOW (ref 80.0–100.0)
Monocytes Absolute: 0.6 10*3/uL (ref 0.1–1.0)
Monocytes Relative: 11 %
Neutro Abs: 3.2 10*3/uL (ref 1.7–7.7)
Neutrophils Relative %: 58 %
Platelets: 266 10*3/uL (ref 150–400)
RBC: 3.51 MIL/uL — ABNORMAL LOW (ref 4.22–5.81)
RDW: 17.2 % — ABNORMAL HIGH (ref 11.5–15.5)
WBC: 5.5 10*3/uL (ref 4.0–10.5)
nRBC: 0 % (ref 0.0–0.2)

## 2023-05-21 LAB — GLUCOSE, CAPILLARY
Glucose-Capillary: 167 mg/dL — ABNORMAL HIGH (ref 70–99)
Glucose-Capillary: 228 mg/dL — ABNORMAL HIGH (ref 70–99)
Glucose-Capillary: 297 mg/dL — ABNORMAL HIGH (ref 70–99)
Glucose-Capillary: 257 mg/dL — ABNORMAL HIGH (ref 70–99)

## 2023-05-21 LAB — BASIC METABOLIC PANEL
Anion gap: 8 (ref 5–15)
BUN: 23 mg/dL (ref 8–23)
CO2: 22 mmol/L (ref 22–32)
Calcium: 7.9 mg/dL — ABNORMAL LOW (ref 8.9–10.3)
Chloride: 108 mmol/L (ref 98–111)
Creatinine, Ser: 2.04 mg/dL — ABNORMAL HIGH (ref 0.61–1.24)
GFR, Estimated: 33 mL/min — ABNORMAL LOW (ref >60)
Glucose, Bld: 156 mg/dL — ABNORMAL HIGH (ref 70–99)
Potassium: 3.7 mmol/L (ref 3.5–5.1)
Sodium: 138 mmol/L (ref 135–145)

## 2023-05-21 MED ORDER — LABETALOL HCL 5 MG/ML IV SOLN
5.0000 mg | INTRAVENOUS | Status: DC | PRN
  Administered 2023-05-21 – 2023-05-22 (×2): 5 mg via INTRAVENOUS
  Filled 2023-05-21 (×3): qty 4

## 2023-05-21 NOTE — Plan of Care (Signed)
  Problem: Coping: Goal: Psychosocial and spiritual needs will be supported Outcome: Not Progressing   Problem: Respiratory: Goal: Will maintain a patent airway Outcome: Not Progressing Goal: Complications related to the disease process, condition or treatment will be avoided or minimized Outcome: Not Progressing   Problem: Education: Goal: Knowledge of General Education information will improve Description: Including pain rating scale, medication(s)/side effects and non-pharmacologic comfort measures Outcome: Not Progressing   Problem: Health Behavior/Discharge Planning: Goal: Ability to manage health-related needs will improve Outcome: Not Progressing   Problem: Clinical Measurements: Goal: Ability to maintain clinical measurements within normal limits will improve Outcome: Not Progressing Goal: Will remain free from infection Outcome: Not Progressing Goal: Diagnostic test results will improve Outcome: Not Progressing Goal: Respiratory complications will improve Outcome: Not Progressing Goal: Cardiovascular complication will be avoided Outcome: Not Progressing   Problem: Activity: Goal: Risk for activity intolerance will decrease Outcome: Not Progressing   Problem: Nutrition: Goal: Adequate nutrition will be maintained Outcome: Not Progressing   Problem: Coping: Goal: Level of anxiety will decrease Outcome: Not Progressing   Problem: Elimination: Goal: Will not experience complications related to bowel motility Outcome: Not Progressing Goal: Will not experience complications related to urinary retention Outcome: Not Progressing   Problem: Pain Management: Goal: General experience of comfort will improve Outcome: Not Progressing   Problem: Safety: Goal: Ability to remain free from injury will improve Outcome: Not Progressing   Problem: Skin Integrity: Goal: Risk for impaired skin integrity will decrease Outcome: Not Progressing   Problem: Safety: Goal:  Non-violent Restraint(s) Outcome: Not Progressing   Problem: Education: Goal: Ability to describe self-care measures that may prevent or decrease complications (Diabetes Survival Skills Education) will improve Outcome: Not Progressing Goal: Individualized Educational Video(s) Outcome: Not Progressing   Problem: Coping: Goal: Ability to adjust to condition or change in health will improve Outcome: Not Progressing   Problem: Fluid Volume: Goal: Ability to maintain a balanced intake and output will improve Outcome: Not Progressing   Problem: Health Behavior/Discharge Planning: Goal: Ability to identify and utilize available resources and services will improve Outcome: Not Progressing Goal: Ability to manage health-related needs will improve Outcome: Not Progressing   Problem: Metabolic: Goal: Ability to maintain appropriate glucose levels will improve Outcome: Not Progressing   Problem: Nutritional: Goal: Maintenance of adequate nutrition will improve Outcome: Not Progressing Goal: Progress toward achieving an optimal weight will improve Outcome: Not Progressing   Problem: Skin Integrity: Goal: Risk for impaired skin integrity will decrease Outcome: Not Progressing   Problem: Tissue Perfusion: Goal: Adequacy of tissue perfusion will improve Outcome: Not Progressing

## 2023-05-21 NOTE — Progress Notes (Signed)
 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 Per ED documentation, family had not heard from patient for the past two days. EMS was called out to patient's home by family and patient was found unresponsive and actively seizing at the side of the patient's bed. Patient's pupils were unequal with right deviation. Upon arrival to ED, patient was obtunded, CBG reading high, and on a 100% NRB. Patient was intubated by ED physician and PCCM consulted for further management.   12/31 Found unresponsive, with active seizures per EMS. Intubated upon arrival to ED  > Intubated, minimal sedation -COVID-positive Arterial line, Right IJ central line, Foley catheter.  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  Several chronic microhemorrhages and supratentorial brain 01/01 EEG with severe diffuse encephalopathy 01/02 renal function worsening, neuro consult with mentation not improving 01/03 mentation improved, following commands, overnight EEG without seizures 1/4 extubated  Assessment & Plan:   Principal Problem:   AMS (altered mental status) Active Problems:   Septic shock (HCC)   Acute hypoxic respiratory failure COVID-positive UTI Severe sepsis, present on admission -Required intubation upon admission, successfully extubated, he is currently on room air -finished 10 days isolation for COVID-19 infection. -Treated 5 days of IV Rocephin , urine culture growing E. coli. 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  Hyperosmolar nonketotic state on admission Diabetes mellitus, type II, uncontrolled -Home med Lantus  26 units lispro to 10 units - DKA has resolved -Patient with labile diabetes mellitus, he had few low readings over the last few days, so has not been started on long-acting insulin , but currently persistently hyperglycemic, he is back on low-dose Semglee , CBG on the higher side currently, so we will increase Semglee  to 8 units daily, continue with insulin  sliding scale as well  -Keep on carb modified diet.  Colon cancer not operated on  Rectal bleeding with known proximal descending colon mass, positive on PET scan Anemia, multifactorial, anemia of chronic kidney disease, iron  deficiency anemia and blood loss anemia, -Surgery input greatly appreciated, recommendation for conservative management currently, and to avoid surgical intervention this hospitalization, till he is more medically stable, where he can follow primary surgeon at [facility] as an outpatient, unless he is having significant bleeding, - GI input greatly appreciated , no plan for endoscopic procedure given its known to be secondary to mass. -Recommendation is for conservative management, and to avoid surgical intervention if bleeding has stabilized -CBC closely and transfuse as needed, for required 2 units PRBC transfusion during hospital stay, hemoglobin has been stable overall . -Ferritin and iron  borderline, on IV iron . -Hemoglobin remained stable on few days of heparin  GTT and Eliquis  continue to monitor, but so  far  no evidence of melena or bright red blood per rectum   AKI on ckd stage 3b Rhabdomyolysis - Baseline creatinine less than 2.  Creatinine peaked at 5.3.  Creatinine trending down.  -Voiding trial 1/10, monitor for retention   Hypernatremia -Resolved    A-flutter CHADVASC >3 on Eliquis  -Resumed Coreg  12.5 twice daily--Keep on monitors -He is on heparin  GTT, hemoglobin has been stable for last few days, with no evidence of recurrent GI bleed, will start on Quist, will lower dose to 2.5 mg twice daily given worsening renal function.   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 Urinary retention Present on admission. -needs outpatient follow-up -He has a Foley catheter, started 1/6 for retention, Foley catheter discontinued 2 days ago, so far with no evidence of retention. -Started on Flomax   Transaminitis -Resolved  BKA 2022  Secondary to osteomyelitis  Pressure ulcer -this is secondary to him being on the floor for unknown period of time -Consult wound care, please see pictures in chart  DVT prophylaxis: Heparin  gtt >> Eliquis  Code Status: (Full) Family Communication: D/W niece [name] by phone1/9, left voicemail today Disposition:   Status is: Inpatient    Consultants:  PCCM GI General surgery neurolgy   Subjective:  No BM yesterday, but day before good BM normal in color Objective: Vitals:   05/20/23 2148 05/21/23 0045 05/21/23 0237 05/21/23 0320  BP: (!) 168/100 (!) 184/83 (!) 168/89 (!) 144/79  Pulse:  68 69 92  Resp:  17 11 18   Temp:  (!) 97.5 F (36.4 C)  98.2 F (36.8 C)  TempSrc:    Oral  SpO2:  91% 97% 96%  Weight:      Height:        Intake/Output Summary (Last 24 hours) at 05/21/2023 1134 Last data filed at 05/20/2023 1748 Gross per 24 hour  Intake 270 ml  Output --  Net 270 ml   Filed Weights   05/17/23 0500 05/18/23 0500 05/19/23 0337  Weight: 90.5 kg 89 kg 88.9 kg     Examination:   Awake Alert, Oriented X 3, more appropriate and communicative today, but he remains with mild impaired cognition and insight no new F.N deficits, Normal affect Symmetrical Chest wall movement, Good air movement bilaterally, CTAB RRR,No Gallops,Rubs or new Murmurs, No Parasternal Heave +ve B.Sounds, Abd Soft, No tenderness, No rebound - guarding or rigidity. No Cyanosis, Clubbing or edema, right BKA   Data Reviewed: I have personally reviewed following labs and imaging studies  CBC: Recent Labs  Lab 05/17/23 0432 05/18/23 0910 05/19/23 0529 05/20/23 0523 05/21/23 0534  WBC 4.8 4.4 5.1 5.8 5.5  NEUTROABS 2.4 2.8 2.9 3.6 3.2  HGB 8.0* 8.0* 8.2* 8.9* 8.7*  HCT 24.9* 24.4* 26.2* 27.5* 26.7*  MCV 75.5* 74.6* 77.3* 76.4* 76.1*  PLT 252 249 245 270 266    Basic Metabolic Panel: Recent Labs  Lab 05/17/23 0432 05/18/23 0910 05/19/23 0529 05/20/23 0523 05/21/23 0534  NA 138 139 140 138 138  K 4.0 4.0 4.2 3.9 3.7  CL 106 108 108 109 108  CO2 25 24 21* 21* 22  GLUCOSE 275* 245* 223* 198* 156*  BUN 36* 29* 27* 24* 23  CREATININE 2.71* 2.46* 2.29* 2.04* 2.04*  CALCIUM  7.5* 7.8* 7.7* 7.9* 7.9*    GFR: Estimated Creatinine Clearance: 32.8 mL/min (A) (by C-G formula based on SCr of 2.04 mg/dL (H)).  Liver Function Tests: Recent Labs  Lab 05/17/23 [phone]  AST 34  ALT 40  ALKPHOS 52  BILITOT 0.7  PROT 5.7*  ALBUMIN  1.9*    CBG: Recent Labs  Lab 05/20/23 0816 05/20/23 1235 05/20/23 1516 05/20/23 2006 05/21/23 0915  GLUCAP 194* 304* 320* 98 167*     Recent Results (from the past 240 hours)  Urine Culture (for pregnant, neutropenic or urologic patients or patients with an indwelling urinary catheter)     Status: Abnormal   Collection Time: 05/11/23  4:50 PM   Specimen: Urine, Catheterized  Result Value Ref Range Status   Specimen Description URINE, CATHETERIZED  Final   Special Requests   Final    NONE Performed at [facility],  [address], [place], [place] [address]    Culture (A)  Final    20,000 COLONIES/mL ESCHERICHIA COLI Confirmed Extended Spectrum Beta-Lactamase Producer (ESBL).  In bloodstream infections from ESBL organisms, carbapenems are preferred over piperacillin /tazobactam. They are shown to have a lower risk of mortality.    Report Status 05/13/2023 FINAL  Final   Organism ID, Bacteria ESCHERICHIA COLI (A)  Final      Susceptibility   Escherichia coli - MIC*    AMPICILLIN  >=32 RESISTANT Resistant     CEFAZOLIN  >=64 RESISTANT Resistant     CEFEPIME  >=32 RESISTANT Resistant     CEFTRIAXONE  >=64 RESISTANT Resistant     CIPROFLOXACIN >=4 RESISTANT Resistant     GENTAMICIN <=1 SENSITIVE Sensitive     IMIPENEM <=0.25 SENSITIVE Sensitive     NITROFURANTOIN <=16 SENSITIVE Sensitive     TRIMETH /SULFA  >=320 RESISTANT Resistant     AMPICILLIN /SULBACTAM >=32 RESISTANT Resistant     PIP/TAZO <=4 SENSITIVE Sensitive ug/mL    * 20,000 COLONIES/mL ESCHERICHIA COLI         Radiology Studies: No results found.      Scheduled Meds:  apixaban   2.5 mg Oral BID   carvedilol   25 mg Oral BID WC   Chlorhexidine  Gluconate Cloth  6 each Topical Daily   vitamin B-12  500 mcg Oral Daily   docusate sodium   100 mg Oral BID   feeding supplement  237 mL Oral TID BM   folic acid   1 mg Oral Daily   insulin  aspart  0-5 Units Subcutaneous QHS   insulin  aspart  0-9 Units Subcutaneous TID WC   insulin  glargine-yfgn  8 Units Subcutaneous Daily   multivitamin with minerals  1 tablet Oral Daily   pantoprazole   40 mg Oral BID   polyethylene glycol  17 g Oral Daily   senna  2 tablet Oral QHS   tamsulosin   0.4 mg Oral Daily   Continuous Infusions:  iron  sucrose 400 mg (05/21/23 1057)      LOS: 12 days       Brayton Lye, MD [facility]   To contact the attending provider between 7A-7P or the covering provider during after hours 7P-7A, please log into the web site [email or web address] and access using  universal [place] password for that web site. If you do not have the password, please call the hospital operator.  05/21/2023, 11:34 AM

## 2023-05-22 DIAGNOSIS — R404 Transient alteration of awareness: Secondary | ICD-10-CM | POA: Diagnosis not present

## 2023-05-22 DIAGNOSIS — A419 Sepsis, unspecified organism: Secondary | ICD-10-CM | POA: Diagnosis not present

## 2023-05-22 DIAGNOSIS — R6521 Severe sepsis with septic shock: Secondary | ICD-10-CM | POA: Diagnosis not present

## 2023-05-22 LAB — CBC WITH DIFFERENTIAL/PLATELET
Abs Immature Granulocytes: 0.03 10*3/uL (ref 0.00–0.07)
Basophils Absolute: 0 10*3/uL (ref 0.0–0.1)
Basophils Relative: 0 %
Eosinophils Absolute: 0.1 10*3/uL (ref 0.0–0.5)
Eosinophils Relative: 1 %
HCT: 26.3 % — ABNORMAL LOW (ref 39.0–52.0)
Hemoglobin: 8.6 g/dL — ABNORMAL LOW (ref 13.0–17.0)
Immature Granulocytes: 1 %
Lymphocytes Relative: 26 %
Lymphs Abs: 1.4 10*3/uL (ref 0.7–4.0)
MCH: 24.7 pg — ABNORMAL LOW (ref 26.0–34.0)
MCHC: 32.7 g/dL (ref 30.0–36.0)
MCV: 75.6 fL — ABNORMAL LOW (ref 80.0–100.0)
Monocytes Absolute: 0.6 10*3/uL (ref 0.1–1.0)
Monocytes Relative: 11 %
Neutro Abs: 3.3 10*3/uL (ref 1.7–7.7)
Neutrophils Relative %: 61 %
Platelets: 275 10*3/uL (ref 150–400)
RBC: 3.48 MIL/uL — ABNORMAL LOW (ref 4.22–5.81)
RDW: 17.6 % — ABNORMAL HIGH (ref 11.5–15.5)
WBC: 5.3 10*3/uL (ref 4.0–10.5)
nRBC: 0.4 % — ABNORMAL HIGH (ref 0.0–0.2)

## 2023-05-22 LAB — BASIC METABOLIC PANEL
Anion gap: 8 (ref 5–15)
BUN: 21 mg/dL (ref 8–23)
CO2: 22 mmol/L (ref 22–32)
Calcium: 7.8 mg/dL — ABNORMAL LOW (ref 8.9–10.3)
Chloride: 107 mmol/L (ref 98–111)
Creatinine, Ser: 2.04 mg/dL — ABNORMAL HIGH (ref 0.61–1.24)
GFR, Estimated: 33 mL/min — ABNORMAL LOW (ref >60)
Glucose, Bld: 144 mg/dL — ABNORMAL HIGH (ref 70–99)
Potassium: 3.4 mmol/L — ABNORMAL LOW (ref 3.5–5.1)
Sodium: 137 mmol/L (ref 135–145)

## 2023-05-22 LAB — GLUCOSE, CAPILLARY
Glucose-Capillary: 156 mg/dL — ABNORMAL HIGH (ref 70–99)
Glucose-Capillary: 168 mg/dL — ABNORMAL HIGH (ref 70–99)
Glucose-Capillary: 212 mg/dL — ABNORMAL HIGH (ref 70–99)

## 2023-05-22 MED ORDER — INSULIN ASPART 100 UNIT/ML IJ SOLN: 0.0000 [IU] | Freq: Every day | INTRAMUSCULAR | Status: DC

## 2023-05-22 MED ORDER — DOCUSATE SODIUM 100 MG PO CAPS: 100.0000 mg | ORAL_CAPSULE | Freq: Two times a day (BID) | ORAL | Status: DC

## 2023-05-22 MED ORDER — POTASSIUM CHLORIDE CRYS ER 20 MEQ PO TBCR
40.0000 meq | EXTENDED_RELEASE_TABLET | Freq: Once | ORAL | Status: AC
  Administered 2023-05-22: 40 meq via ORAL
  Filled 2023-05-22: qty 2

## 2023-05-22 MED ORDER — MEDIHONEY WOUND/BURN DRESSING EX PSTE: 1.0000 | PASTE | Freq: Every day | CUTANEOUS | Status: DC

## 2023-05-22 MED ORDER — FOLIC ACID 1 MG PO TABS: 1.0000 mg | ORAL_TABLET | Freq: Every day | ORAL | Status: DC

## 2023-05-22 MED ORDER — INSULIN ASPART 100 UNIT/ML IJ SOLN: 0.0000 [IU] | Freq: Three times a day (TID) | INTRAMUSCULAR | Status: DC

## 2023-05-22 MED ORDER — SENNA 8.6 MG PO TABS: 2.0000 | ORAL_TABLET | Freq: Every day | ORAL | Status: DC

## 2023-05-22 MED ORDER — INSULIN GLARGINE-YFGN 100 UNIT/ML ~~LOC~~ SOLN: 8.0000 [IU] | Freq: Every day | SUBCUTANEOUS | Status: DC

## 2023-05-22 MED ORDER — POLYETHYLENE GLYCOL 3350 17 G PO PACK: 17.0000 g | PACK | Freq: Every day | ORAL | Status: DC | PRN

## 2023-05-22 MED ORDER — PANTOPRAZOLE SODIUM 40 MG PO TBEC: 40.0000 mg | DELAYED_RELEASE_TABLET | Freq: Two times a day (BID) | ORAL | Status: DC

## 2023-05-22 MED ORDER — ADULT MULTIVITAMIN W/MINERALS CH: 1.0000 | ORAL_TABLET | Freq: Every day | ORAL | Status: DC

## 2023-05-22 MED ORDER — ENSURE ENLIVE PO LIQD: 237.0000 mL | Freq: Three times a day (TID) | ORAL | Status: DC

## 2023-05-22 MED ORDER — MEDIHONEY WOUND/BURN DRESSING EX PSTE
1.0000 | PASTE | Freq: Every day | CUTANEOUS | Status: DC
  Filled 2023-05-22: qty 44

## 2023-05-22 MED ORDER — CYANOCOBALAMIN 500 MCG PO TABS: 500.0000 ug | ORAL_TABLET | Freq: Every day | ORAL | Status: DC

## 2023-05-22 MED ORDER — APIXABAN 2.5 MG PO TABS: 2.5000 mg | ORAL_TABLET | Freq: Two times a day (BID) | ORAL | Status: DC

## 2023-05-22 NOTE — Plan of Care (Signed)
  Problem: Coping: Goal: Psychosocial and spiritual needs will be supported Outcome: Adequate for Discharge   Problem: Respiratory: Goal: Will maintain a patent airway Outcome: Adequate for Discharge Goal: Complications related to the disease process, condition or treatment will be avoided or minimized Outcome: Adequate for Discharge   Problem: Education: Goal: Knowledge of General Education information will improve Description: Including pain rating scale, medication(s)/side effects and non-pharmacologic comfort measures Outcome: Adequate for Discharge   Problem: Health Behavior/Discharge Planning: Goal: Ability to manage health-related needs will improve Outcome: Adequate for Discharge   Problem: Clinical Measurements: Goal: Ability to maintain clinical measurements within normal limits will improve Outcome: Adequate for Discharge Goal: Will remain free from infection Outcome: Adequate for Discharge Goal: Diagnostic test results will improve Outcome: Adequate for Discharge Goal: Respiratory complications will improve Outcome: Adequate for Discharge Goal: Cardiovascular complication will be avoided Outcome: Adequate for Discharge   Problem: Activity: Goal: Risk for activity intolerance will decrease Outcome: Adequate for Discharge   Problem: Nutrition: Goal: Adequate nutrition will be maintained Outcome: Adequate for Discharge   Problem: Coping: Goal: Level of anxiety will decrease Outcome: Adequate for Discharge   Problem: Elimination: Goal: Will not experience complications related to bowel motility Outcome: Adequate for Discharge Goal: Will not experience complications related to urinary retention Outcome: Adequate for Discharge   Problem: Pain Management: Goal: General experience of comfort will improve Outcome: Adequate for Discharge   Problem: Safety: Goal: Ability to remain free from injury will improve Outcome: Adequate for Discharge   Problem: Skin  Integrity: Goal: Risk for impaired skin integrity will decrease Outcome: Adequate for Discharge   Problem: Safety: Goal: Non-violent Restraint(s) Outcome: Adequate for Discharge   Problem: Education: Goal: Ability to describe self-care measures that may prevent or decrease complications (Diabetes Survival Skills Education) will improve Outcome: Adequate for Discharge Goal: Individualized Educational Video(s) Outcome: Adequate for Discharge   Problem: Coping: Goal: Ability to adjust to condition or change in health will improve Outcome: Adequate for Discharge   Problem: Fluid Volume: Goal: Ability to maintain a balanced intake and output will improve Outcome: Adequate for Discharge   Problem: Health Behavior/Discharge Planning: Goal: Ability to identify and utilize available resources and services will improve Outcome: Adequate for Discharge Goal: Ability to manage health-related needs will improve Outcome: Adequate for Discharge   Problem: Metabolic: Goal: Ability to maintain appropriate glucose levels will improve Outcome: Adequate for Discharge   Problem: Nutritional: Goal: Maintenance of adequate nutrition will improve Outcome: Adequate for Discharge Goal: Progress toward achieving an optimal weight will improve Outcome: Adequate for Discharge   Problem: Skin Integrity: Goal: Risk for impaired skin integrity will decrease Outcome: Adequate for Discharge   Problem: Tissue Perfusion: Goal: Adequacy of tissue perfusion will improve Outcome: Adequate for Discharge   Problem: Acute Rehab OT Goals (only OT should resolve) Goal: Pt. Will Perform Lower Body Bathing Outcome: Adequate for Discharge Goal: Pt. Will Perform Lower Body Dressing Outcome: Adequate for Discharge Goal: Pt. Will Transfer To Toilet Outcome: Adequate for Discharge Goal: OT Additional ADL Goal #1 Outcome: Adequate for Discharge

## 2023-05-22 NOTE — Progress Notes (Signed)
 Attempted to call report to [employer]. Transferred from main desk twice. No answer from primary nurse either time.

## 2023-05-22 NOTE — Discharge Instructions (Signed)
 Follow with Primary MD [name], MD /SNF physician  Get CBC, CMP, checked  by Primary MD next visit.    Activity: As tolerated with Full fall precautions use walker/cane & assistance as needed   Disposition SNF   Diet: Heart healthy-carb modified   On your next visit with your primary care physician please Get Medicines reviewed and adjusted.   Please request your Prim.MD to go over all Hospital Tests and Procedure/Radiological results at the follow up, please get all Hospital records sent to your Prim MD by signing hospital release before you go home.   If you experience worsening of your admission symptoms, develop shortness of breath, life threatening emergency, suicidal or homicidal thoughts you must seek medical attention immediately by calling 911 or calling your MD immediately  if symptoms less severe.  You Must read complete instructions/literature along with all the possible adverse reactions/side effects for all the Medicines you take and that have been prescribed to you. Take any new Medicines after you have completely understood and accpet all the possible adverse reactions/side effects.   Do not drive, operating heavy machinery, perform activities at heights, swimming or participation in water activities or provide baby sitting services if your were admitted for syncope or siezures until you have seen by Primary MD or a Neurologist and advised to do so again.  Do not drive when taking Pain medications.    Do not take more than prescribed Pain, Sleep and Anxiety Medications  Special Instructions: If you have smoked or chewed Tobacco  in the last 2 yrs please stop smoking, stop any regular Alcohol  and or any Recreational drug use.  Wear Seat belts while driving.   Please note  You were cared for by a hospitalist during your hospital stay. If you have any questions about your discharge medications or the care you received while you were in the hospital  after you are discharged, you can call the unit and asked to speak with the hospitalist on call if the hospitalist that took care of you is not available. Once you are discharged, your primary care physician will handle any further medical issues. Please note that NO REFILLS for any discharge medications will be authorized once you are discharged, as it is imperative that you return to your primary care physician (or establish a relationship with a primary care physician if you do not have one) for your aftercare needs so that they can reassess your need for medications and monitor your lab values.

## 2023-05-22 NOTE — Plan of Care (Signed)
  Problem: Coping: Goal: Psychosocial and spiritual needs will be supported Outcome: Progressing   Problem: Respiratory: Goal: Will maintain a patent airway Outcome: Progressing Goal: Complications related to the disease process, condition or treatment will be avoided or minimized Outcome: Progressing   Problem: Education: Goal: Knowledge of General Education information will improve Description: Including pain rating scale, medication(s)/side effects and non-pharmacologic comfort measures Outcome: Progressing   Problem: Health Behavior/Discharge Planning: Goal: Ability to manage health-related needs will improve Outcome: Progressing   Problem: Clinical Measurements: Goal: Ability to maintain clinical measurements within normal limits will improve Outcome: Progressing Goal: Will remain free from infection Outcome: Progressing Goal: Diagnostic test results will improve Outcome: Progressing Goal: Respiratory complications will improve Outcome: Progressing Goal: Cardiovascular complication will be avoided Outcome: Progressing   Problem: Activity: Goal: Risk for activity intolerance will decrease Outcome: Progressing   Problem: Nutrition: Goal: Adequate nutrition will be maintained Outcome: Progressing   Problem: Coping: Goal: Level of anxiety will decrease Outcome: Progressing   Problem: Elimination: Goal: Will not experience complications related to bowel motility Outcome: Progressing Goal: Will not experience complications related to urinary retention Outcome: Progressing   Problem: Pain Management: Goal: General experience of comfort will improve Outcome: Progressing   Problem: Safety: Goal: Ability to remain free from injury will improve Outcome: Progressing   Problem: Skin Integrity: Goal: Risk for impaired skin integrity will decrease Outcome: Progressing   Problem: Safety: Goal: Non-violent Restraint(s) Outcome: Progressing   Problem:  Education: Goal: Ability to describe self-care measures that may prevent or decrease complications (Diabetes Survival Skills Education) will improve Outcome: Progressing Goal: Individualized Educational Video(s) Outcome: Progressing   Problem: Coping: Goal: Ability to adjust to condition or change in health will improve Outcome: Progressing   Problem: Fluid Volume: Goal: Ability to maintain a balanced intake and output will improve Outcome: Progressing   Problem: Health Behavior/Discharge Planning: Goal: Ability to identify and utilize available resources and services will improve Outcome: Progressing Goal: Ability to manage health-related needs will improve Outcome: Progressing   Problem: Metabolic: Goal: Ability to maintain appropriate glucose levels will improve Outcome: Progressing   Problem: Nutritional: Goal: Maintenance of adequate nutrition will improve Outcome: Progressing Goal: Progress toward achieving an optimal weight will improve Outcome: Progressing   Problem: Skin Integrity: Goal: Risk for impaired skin integrity will decrease Outcome: Progressing   Problem: Tissue Perfusion: Goal: Adequacy of tissue perfusion will improve Outcome: Progressing

## 2023-05-22 NOTE — TOC Transition Note (Signed)
 Transition of Care [facility]) - Discharge Note   Patient Details  Name: Cory [name] MRN: [id number] Date of Birth: 1945/08/23  Transition [facility]) CM/SW Contact:  Inocente GORMAN Kindle, LCSW Phone Number: 05/22/2023, 12:51 PM   Clinical Narrative:    Patient will DC to: [facility] Anticipated DC date: 05/22/23 Family notified: Niece, Ship Broker by: ROME   Per MD patient ready for DC to [facility]. RN to call report prior to discharge [phone] room 504). RN, patient, patient's family, and facility notified of DC. Discharge Summary and FL2 sent to facility. DC packet on chart. Ambulance transport requested for patient.   CSW will sign off for now as social work intervention is no longer needed. Please consult us  again if new needs arise.     Final next level of care: Skilled Nursing Facility Barriers to Discharge: Barriers Resolved   Patient Goals and CMS Choice Patient states their goals for this hospitalization and ongoing recovery are:: Rehab CMS Medicare.gov Compare Post Acute Care list provided to:: Patient Represenative (must comment) Choice offered to / list presented to :  (Niece) [place] ownership interest in [facility]provided to::  (Niece)    Discharge Placement   Existing PASRR number confirmed : 05/22/23          Patient chooses bed at: [facility] Patient to be transferred to facility by: [facility] Name of family member notified: Niece Patient and family notified of of transfer: 05/22/23  Discharge Plan and Services Additional resources added to the After Visit Summary for   In-house Referral: Clinical Social Work   Post Acute Care Choice: Skilled Nursing Facility                               Social Drivers of Health (SDOH) Interventions SDOH Screenings   Food Insecurity: Patient Unable To Answer (05/10/2023)  Housing: Patient Unable To Answer (05/10/2023)  Transportation Needs: Patient Unable To Answer (05/10/2023)   Utilities: Patient Unable To Answer (05/10/2023)  Social Connections: Patient Unable To Answer (05/10/2023)  Tobacco Use: Low Risk  (05/09/2023)     Readmission Risk Interventions     No data to display

## 2023-05-22 NOTE — Discharge Summary (Addendum)
 Physician Discharge Summary  [place] FMW:[id number] DOB: Dec 14, 1945 DOA: 05/09/2023  PCP: [name], MD  Admit date: 05/09/2023 Discharge date: 05/22/2023  Admitted From: (Home) Disposition:  (SNF)  Recommendations for Outpatient Follow-up:  Please check CBC, BMP in 3 days. Please arrange for follow-up appointment with patient's primary surgeon Dr. 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 Per ED documentation, family had not heard from patient for the past two days. EMS was called out to patient's home by family and patient was found unresponsive and actively seizing at the side of the patient's bed. Patient's pupils were unequal with right deviation. Upon arrival to ED, patient was obtunded, CBG reading high, and on a 100% NRB. Patient was intubated by ED physician and PCCM consulted for further management.    12/31 Found unresponsive, with active seizures per EMS. Intubated upon arrival to ED  > Intubated, minimal sedation -COVID-positive Arterial line, Right IJ central line, Foley catheter.  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  Several chronic microhemorrhages and supratentorial brain 01/01 EEG with severe diffuse encephalopathy 01/02 renal function worsening, neuro consult with mentation not improving 01/03 mentation improved, following commands, overnight EEG without seizures 1/4 extubated    Acute hypoxic respiratory failure COVID-positive UTI Severe sepsis, present on admission -Required intubation upon admission, successfully extubated, he is currently on room air -finished 10 days isolation for COVID-19 infection.  He is off isolation  05/20/2023 -Treated 5 days of IV Rocephin , urine culture growing E. coli.  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   Hyperosmolar nonketotic state on admission Diabetes mellitus, type II, uncontrolled -Home med Lantus  26 units lispro to 10 units - DKA has resolved -Patient with labile diabetes mellitus, he had few low readings over the last few days, so has not been started on long-acting insulin , but currently persistently hyperglycemic, he is back on low-dose Semglee , CBG on the higher side currently, will currently be discharged on current dose insulin  of sliding scale and 8 units of Semglee .      Colon cancer not operated on  Rectal bleeding with known proximal descending colon mass, positive on PET scan Anemia, multifactorial, anemia of chronic kidney disease, iron  deficiency anemia and blood loss anemia, -Surgery input greatly appreciated, recommendation for conservative management currently, and to avoid surgical intervention this hospitalization, till he is more medically stable, where he can follow primary surgeon at [facility] as an outpatient, unless he is having significant bleeding, patient did not have recurrent GI bleed, overall his hemoglobin has been stable. - GI input greatly appreciated , no plan for endoscopic procedure given its known to be secondary to mass. -Recommendation is for conservative management, and to avoid surgical intervention if bleeding  has stabilized -CBC closely and transfuse as needed,  for required 2 units PRBC transfusion during hospital stay, hemoglobin has been stable overall . -Ferritin and iron  borderline, he received IV iron . -Hemoglobin remained stable on few days of heparin  GTT and Eliquis  continue to monitor, but so far no evidence of melena or bright red blood per rectum     AKI on ckd stage 3b Rhabdomyolysis - Baseline creatinine less than 2.  Creatinine peaked at 5.3.  Creatinine trending down.  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 Urinary retention Present on admission. -needs outpatient follow-up -He has a Foley catheter, started 1/6 for retention, Foley catheter discontinued 3 days ago, so far with no evidence of retention. -Back on Flomax    Transaminitis -Resolved   BKA 2022  Secondary to osteomyelitis   Pressure ulcer POA -this is secondary to him being on the floor for unknown period of time -Consult wound care, please see pictures in chart   Pressure Injury 05/15/23 Coccyx Mid Stage 2 -  Partial thickness loss of dermis presenting as a shallow open injury with a red, pink wound bed without slough. fissure st 1 in gluteal cleft (Active)  05/15/23 1600  Location: Coccyx  Location Orientation: Mid  Staging: Stage 2 -  Partial thickness loss of dermis presenting as a shallow open injury with a red, pink wound bed without slough.  Wound Description (Comments): fissure st 1 in gluteal cleft  Present on Admission:     WOC Nurse Consult Note: Reason for Consult: coccyx and L arm wounds  Wound type:1.   Full thickness L elbow unknown etiology  2.  Unstageable Pressure Injury coccyx  Pressure Injury POA: no, charted as Stage 2 on 05/15/2023  Measurement: 1.  L elbow with 2 separate areas 3 cm x 4 cm 30% pink dry, 50% tan dry 20% black eschar, superior to this 3 cm x 3 cm 80% pink dry 20% tan fibrin dry  Wound bed: as above  Drainage (amount, consistency, odor) none  Periwound: intact  Dressing procedure/placement/frequency: Clean coccyx and L elbow wounds with NS, apply Medihoney to wound beds daily.  Cover with dry gauze and silicone foam.  May lift foam daily to replace Medihoney.  Change foam q3 days and prn soiling.          Discharge Diagnoses:  Principal Problem:   AMS (altered mental status) Active Problems:   Septic shock [facility])    Discharge Instructions  Discharge Instructions     Call MD for:  temperature >100.4   Complete by: As directed    Diet - low sodium heart healthy   Complete by: As directed    Discharge instructions   Complete by: As directed    Follow with Primary MD [name], MD /SNF physician  Get CBC, CMP, checked  by Primary MD next visit.    Activity: As tolerated with Full fall precautions use walker/cane & assistance as needed   Disposition SNF   Diet: Heart healthy-carb modified   On your next visit with your primary care physician please Get Medicines reviewed and adjusted.   Please request  your Prim.MD to go over all Hospital Tests and Procedure/Radiological results at the follow up, please get all Hospital records sent to your Prim MD by signing hospital release before you go home.   If you experience worsening of your admission symptoms, develop shortness of breath, life threatening emergency, suicidal or homicidal thoughts you must seek medical attention immediately by calling 911 or calling your MD immediately  if symptoms less severe.  You Must read complete instructions/literature along with all the possible adverse  reactions/side effects for all the Medicines you take and that have been prescribed to you. Take any new Medicines after you have completely understood and accpet all the possible adverse reactions/side effects.   Do not drive, operating heavy machinery, perform activities at heights, swimming or participation in water activities or provide baby sitting services if your were admitted for syncope or siezures until you have seen by Primary MD or a Neurologist and advised to do so again.  Do not drive when taking Pain medications.    Do not take more than prescribed Pain, Sleep and Anxiety Medications  Special Instructions: If you have smoked or chewed Tobacco  in the last 2 yrs please stop smoking, stop any regular Alcohol  and or any Recreational drug use.  Wear Seat belts while driving.   Please note  You were cared for by a hospitalist during your hospital stay. If you have any questions about your discharge medications or the care you received while you were in the hospital after you are discharged, you can call the unit and asked to speak with the hospitalist on call if the hospitalist that took care of you is not available. Once you are discharged, your primary care physician will handle any further medical issues. Please note that NO REFILLS for any discharge medications will be authorized once you are discharged, as it is imperative that you return to your primary care physician (or establish a relationship with a primary care physician if you do not have one) for your aftercare needs so that they can reassess your need for medications and monitor your lab values.   Discharge wound care:   Complete by: As directed    Coccyx Mid Stage 2 -  Partial thickness loss of dermis presenting as a shallow open injury with a red, pink wound bed without slough. fissure st 1 in gluteal cleft   Discharge wound care:   Complete by: As directed    WOC Nurse Consult Note: Reason for Consult: coccyx and L  arm wounds  Wound type:1.  Full thickness L elbow unknown etiology  2.  Unstageable Pressure Injury coccyx  Pressure Injury POA: no, charted as Stage 2 on 05/15/2023  Measurement: 1.  L elbow with 2 separate areas 3 cm x 4 cm 30% pink dry, 50% tan dry 20% black eschar, superior to this 3 cm x 3 cm 80% pink dry 20% tan fibrin dry  Wound bed: as above  Drainage (amount, consistency, odor) none  Periwound: intact  Dressing procedure/placement/frequency: Clean coccyx and L elbow wounds with NS, apply Medihoney to wound beds daily.  Cover with dry gauze and silicone foam.  May lift foam daily to replace Medihoney.  Change foam q3 days and prn soiling.   Increase activity slowly   Complete by: As directed       Allergies as of 05/22/2023   No Known Allergies      Medication List     STOP taking these medications  B-D UF III MINI PEN NEEDLES 31G X 5 MM Misc Generic drug: Insulin  Pen Needle   HumaLOG  KwikPen 100 UNIT/ML KwikPen Generic drug: insulin  lispro   Lantus  SoloStar 100 UNIT/ML Solostar Pen Generic drug: insulin  glargine   metoprolol  tartrate 50 MG tablet Commonly known as: LOPRESSOR    neomycin  500 MG tablet Commonly known as: MYCIFRADIN    nystatin 100000 UNIT/ML suspension Commonly known as: MYCOSTATIN   potassium chloride  10 MEQ tablet Commonly known as: KLOR-CON  M   potassium chloride  10 MEQ tablet Commonly known as: KLOR-CON    sertraline  50 MG tablet Commonly known as: ZOLOFT    torsemide  20 MG tablet Commonly known as: DEMADEX        TAKE these medications    acetaminophen  325 MG tablet Commonly known as: TYLENOL  Take 2 tablets (650 mg total) by mouth every 6 (six) hours as needed for mild pain, headache or fever (or Fever >/= 101).   apixaban  2.5 MG Tabs tablet Commonly known as: ELIQUIS  Take 1 tablet (2.5 mg total) by mouth 2 (two) times daily. What changed:  medication strength how much to take   atorvastatin  20 MG tablet Commonly known as:  LIPITOR Take 20 mg by mouth at bedtime.   carvedilol  25 MG tablet Commonly known as: COREG  Take 25 mg by mouth 2 (two) times daily with a meal.   cyanocobalamin  500 MCG tablet Commonly known as: VITAMIN B12 Take 1 tablet (500 mcg total) by mouth daily. Start taking on: May 23, 2023   docusate sodium  100 MG capsule Commonly known as: COLACE Take 1 capsule (100 mg total) by mouth 2 (two) times daily.   feeding supplement Liqd Take 237 mLs by mouth 3 (three) times daily between meals.   folic acid  1 MG tablet Commonly known as: FOLVITE  Take 1 tablet (1 mg total) by mouth daily. Start taking on: May 23, 2023   insulin  aspart 100 UNIT/ML injection Commonly known as: novoLOG  Inject 0-5 Units into the skin at bedtime. CBG 70 - 120: 0 units CBG 121 - 150: 0 units CBG 151 - 200: 0 units CBG 201 - 250: 2 units CBG 251 - 300: 3 units CBG 301 - 350: 4 units CBG 351 - 400: 5 units   insulin  aspart 100 UNIT/ML injection Commonly known as: novoLOG  Inject 0-9 Units into the skin 3 (three) times daily with meals. CBG < 70: Implement Hypoglycemia Standing Orders and refer to Hypoglycemia Standing Orders sidebar report CBG 70 - 120: 0 units CBG 121 - 150: 1 unit CBG 151 - 200: 2 units CBG 201 - 250: 3 units CBG 251 - 300: 5 units CBG 301 - 350: 7 units CBG 351 - 400: 9 units CBG > 400: call MD and obtain STAT lab verification   insulin  glargine-yfgn 100 UNIT/ML injection Commonly known as: SEMGLEE  Inject 0.08 mLs (8 Units total) into the skin daily. Start taking on: May 23, 2023   leptospermum manuka honey Pste paste Apply 1 Application topically daily.   multivitamin with minerals Tabs tablet Take 1 tablet by mouth daily. Start taking on: May 23, 2023   pantoprazole  40 MG tablet Commonly known as: PROTONIX  Take 1 tablet (40 mg total) by mouth 2 (two) times daily.   polyethylene glycol 17 g packet Commonly known as: MIRALAX  / GLYCOLAX  Take 17 g by mouth daily as  needed.   senna 8.6 MG Tabs tablet Commonly known as: SENOKOT Take 2 tablets (17.2 mg total) by mouth at bedtime.   tamsulosin  0.4 MG Caps  capsule Commonly known as: FLOMAX  Take 1 capsule (0.4 mg total) by mouth daily.   Vitamin D  (Ergocalciferol ) 1.25 MG (50000 UNIT) Caps capsule Commonly known as: DRISDOL  Take 50,000 Units by mouth every Sunday.               Discharge Care Instructions  (From admission, onward)           Start     Ordered   05/22/23 0000  Discharge wound care:       Comments: Coccyx Mid Stage 2 -  Partial thickness loss of dermis presenting as a shallow open injury with a red, pink wound bed without slough. fissure st 1 in gluteal cleft   05/22/23 1042   05/22/23 0000  Discharge wound care:       Comments: WOC Nurse Consult Note: Reason for Consult: coccyx and L arm wounds  Wound type:1.  Full thickness L elbow unknown etiology  2.  Unstageable Pressure Injury coccyx  Pressure Injury POA: no, charted as Stage 2 on 05/15/2023  Measurement: 1.  L elbow with 2 separate areas 3 cm x 4 cm 30% pink dry, 50% tan dry 20% black eschar, superior to this 3 cm x 3 cm 80% pink dry 20% tan fibrin dry  Wound bed: as above  Drainage (amount, consistency, odor) none  Periwound: intact  Dressing procedure/placement/frequency: Clean coccyx and L elbow wounds with NS, apply Medihoney to wound beds daily.  Cover with dry gauze and silicone foam.  May lift foam daily to replace Medihoney.  Change foam q3 days and prn soiling.   05/22/23 1128            Contact information for follow-up providers     [name], MD Follow up in 1 week(s).   Specialty: Surgical Oncology Contact information: [address] [place] [address] [phone]              Contact information for after-discharge care     Destination     [facility] Preferred SNF .   Service: Skilled Nursing Contact information: [address] [place]  [place]  [address] [phone]                    No Known Allergies    Consultants:  PCCM GI General surgery neurolgy    Procedures/Studies: Overnight EEG with video Result Date: 05/12/2023 [place] Arlin KIDD, MD     05/12/2023  9:45 AM Patient Name: Alaric Gladwin MRN: [id number] Epilepsy Attending: Arlin KIDD Shelton Referring Physician/Provider: Khaliqdina, Salman, MD  Duration: 05/10/2022 1924 to 05/11/2022 0741 Patient history: 78 y.o. 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 EEG to evaluate for seizure Level of alertness: comatose AEDs during EEG study: None Technical aspects: This EEG study was done with scalp electrodes positioned according to the 10-20 International system of electrode placement. Electrical activity was reviewed with band pass filter of 1-70Hz , sensitivity of 7 uV/mm, display speed of 55mm/sec with a 60Hz  notched filter applied as appropriate. EEG data were recorded continuously and digitally stored.  Video monitoring was available and reviewed as appropriate. Description: EEG showed near continuous generalized low amplitude rhythmic 2-3Hz  delta slowing with overriding 13-15hz  beta activity admixed with 1 to 2 seconds of generalized EEG attenuation. Hyperventilation and photic stimulation were not performed.   ABNORMALITY -  Continuous slow, generalized IMPRESSION: This study is suggestive of severe to profound diffuse encephalopathy. No seizures or epileptiform discharges were seen throughout the recording. Arlin MALVA Krebs   DG CHEST PORT 1 VIEW Result Date: 05/11/2023 CLINICAL DATA:  Aspiration. EXAM: PORTABLE CHEST 1 VIEW COMPARISON:  Same day. FINDINGS: The heart size and mediastinal contours are within normal limits. Endotracheal and nasogastric tubes are unchanged. Right internal jugular  catheter is unchanged. Both lungs are clear. The visualized skeletal structures are unremarkable. IMPRESSION: Stable support apparatus.  No active disease. Electronically Signed   By: Lynwood Landy Raddle M.D.   On: 05/11/2023 12:11   DG CHEST PORT 1 VIEW Result Date: 05/11/2023 CLINICAL DATA:  Endotracheal tube placement.  Hypoxia. EXAM: PORTABLE CHEST 1 VIEW COMPARISON:  Radiographs 05/09/2023 and 12/26/2022. FINDINGS: 0435 hours. The endotracheal tube is not significantly changed, tip approximately 4.4 cm above the carina. Right IJ central venous catheter projects over the superior cavoatrial junction, and the enteric tube projects below the diaphragm. The heart size and mediastinal contours are stable. The lungs remain clear. No pleural effusion or pneumothorax. Left subphrenic surgical clip noted. The bones appear unremarkable. IMPRESSION: Stable support system. No evidence of acute cardiopulmonary process. Electronically Signed   By: Elsie Perone M.D.   On: 05/11/2023 09:18   DG CHEST PORT 1 VIEW Result Date: 05/09/2023 CLINICAL DATA:  Central line placement EXAM: PORTABLE CHEST 1 VIEW COMPARISON:  05/09/2023 FINDINGS: Single frontal view of the chest demonstrates endotracheal tube overlying tracheal air column, tip 5 cm above carina. Enteric catheter passes below diaphragm, tip and side port projecting over the gastric fundus. Right internal jugular central venous catheter tip overlies superior vena cava. The cardiac silhouette is unremarkable. No acute airspace disease, effusion, or pneumothorax. No acute bony abnormalities. IMPRESSION: 1. Support devices as above. No complication after central venous catheter placement. 2. No acute intrathoracic process. Electronically Signed   By: Ozell Daring M.D.   On: 05/09/2023 21:31   MR BRAIN WO CONTRAST Result Date: 05/09/2023 CLINICAL DATA:  Provided history: Mental status change, unknown cause. Patient found down, seizing. EXAM: MRI HEAD WITHOUT CONTRAST  TECHNIQUE: Multiplanar, multiecho pulse sequences of the brain and surrounding structures were obtained without intravenous contrast. COMPARISON:  Head CT 05/09/2023.  Brain MRI 02/16/2023. FINDINGS: Brain: Moderate-to-advanced generalized cerebral atrophy. Mild cerebellar atrophy. Commensurate prominence of the ventricles and sulci. 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 Chronic infarct again noted within the anterior right frontal lobe white matter. Mild multifocal T2 FLAIR hyperintense signal abnormality elsewhere within the cerebral white matter and thalami, nonspecific but compatible chronic small vessel ischemic disease. Several chronic microhemorrhages within the supratentorial brain (predominantly within the deep gray nuclei). Tiny chronic infarct within the left cerebellar hemisphere, not definitively present on the prior MRI of 02/16/2023. No appreciable hippocampal size or signal asymmetry. There is no acute infarct. No evidence of an intracranial mass. No extra-axial fluid collection. No midline shift. Vascular: Maintained flow voids within the proximal large arterial vessels. Skull and upper cervical spine: No focal worrisome marrow lesion. Incompletely assessed cervical spondylosis. C3-C4 ankylosis. Sinuses/Orbits: No mass or acute finding within the imaged orbits. Prior bilateral ocular lens replacement. Mild mucosal thickening within the bilateral ethmoid and sphenoid sinuses. IMPRESSION: 1.  No evidence of an acute intracranial abnormality. 2. 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 3. Chronic infarcts also present within the anterior right  frontal lobe white matter and left cerebellar hemisphere. The tiny chronic left cerebellar infarct was not definitively present on the prior brain MRI 02/16/2023. 4. Background mild chronic small vessel  ischemic changes within the cerebral white matter and thalami, similar to the prior brain MRI of 02/16/2023. 5. 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 6. Mild paranasal sinus mucosal thickening. Electronically Signed   By: Rockey Childs D.O.   On: 05/09/2023 17:04   EEG adult Result Date: 05/09/2023 Shelton Arlin KIDD, MD     05/09/2023  4:50 PM Patient Name: Arad Burston MRN: [id number] Epilepsy Attending: Arlin KIDD Shelton Referring Physician/Provider: [name], NP Date: 05/09/2023 Duration: 22.03 mins Patient history: 78yo M with ams getting eeg to evaluate for seizure Level of alertness: comatose AEDs during EEG study: None Technical aspects: This EEG study was done with scalp electrodes positioned according to the 10-20 International system of electrode placement. Electrical activity was reviewed with band pass filter of 1-70Hz , sensitivity of 7 uV/mm, display speed of 38mm/sec with a 60Hz  notched filter applied as appropriate. EEG data were recorded continuously and digitally stored.  Video monitoring was available and reviewed as appropriate. Description: EEG showed continuous generalized and maximal bifrontal 3 to 6 Hz theta-delta slowing. Hyperventilation and photic stimulation were not performed.   ABNORMALITY - Continuous slow, generalized and maximal bifrontal IMPRESSION: This study is suggestive of cortical dysfunction arising from bifrontal region likely secondary to underlying structural abnormality. Additionally there is severe diffuse encephalopathy. No seizures or epileptiform discharges were seen throughout the recording. Arlin KIDD Shelton   CT Cervical Spine Wo Contrast Result Date: 05/09/2023 CLINICAL DATA:  Neck trauma EXAM: CT CERVICAL SPINE WITHOUT CONTRAST TECHNIQUE: Multidetector CT imaging of the cervical spine was performed without intravenous contrast. Multiplanar CT image  reconstructions were also generated. RADIATION DOSE REDUCTION: This exam was performed according to the departmental dose-optimization program which includes automated exposure control, adjustment of the mA and/or kV according to patient size and/or use of iterative reconstruction technique. COMPARISON:  None Available. FINDINGS: Alignment: Normal Skull base and vertebrae: No acute fracture. No primary bone lesion or focal pathologic process. Soft tissues and spinal canal: No prevertebral fluid or swelling. No visible canal hematoma. Disc levels: Large flowing anterior and lateral osteophytes with partial fusion across all the cervical disc spaces. No disc herniation. Mild bilateral degenerative facet disease. Upper chest: No acute findings Other: None IMPRESSION: Diffuse degenerative changes with flowing anterior and lateral osteophytes. Partial fusion across the disc spaces. No acute bony abnormality. Electronically Signed   By: Franky Crease M.D.   On: 05/09/2023 14:00   CT Head Wo Contrast Result Date: 05/09/2023 CLINICAL DATA:  Altered mental status EXAM: CT HEAD WITHOUT CONTRAST TECHNIQUE: Contiguous axial images were obtained from the base of the skull through the vertex without intravenous contrast. RADIATION DOSE REDUCTION: This exam was performed according to the departmental dose-optimization program which includes automated exposure control, adjustment of the mA and/or kV according to patient size and/or use of iterative reconstruction technique. COMPARISON:  02/16/2023 FINDINGS: Brain: There is atrophy and chronic small vessel disease changes. No acute intracranial abnormality. Specifically, no hemorrhage, hydrocephalus, mass lesion, acute infarction, or significant intracranial injury. Vascular: No hyperdense vessel or unexpected calcification. Skull: No acute calvarial abnormality. Sinuses/Orbits: No acute findings Other: None IMPRESSION: Atrophy, chronic microvascular disease. No acute  intracranial abnormality. Electronically Signed   By: Franky Crease M.D.   On: 05/09/2023 13:57  DG Abd Portable 1V Result Date: 05/09/2023 CLINICAL DATA:  OG tube placement EXAM: PORTABLE ABDOMEN - 1 VIEW COMPARISON:  Abdominal radiographs dated December 27, 2022. FINDINGS: Enteric tube tip and side port overlies the left upper abdominal quadrant in the expected region of the stomach. Nonobstructive bowel-gas pattern. No free air. IMPRESSION: Enteric tube tip and side port within the stomach. Electronically Signed   By: Harrietta Sherry M.D.   On: 05/09/2023 12:57   DG Chest Portable 1 View Result Date: 05/09/2023 CLINICAL DATA:  Seizure. EXAM: PORTABLE CHEST 1 VIEW COMPARISON:  Chest radiograph dated December 26, 2022. FINDINGS: Endotracheal tube tip is located 3.3 cm above the carina. Overlapping telemetry wires. The heart size and mediastinal contours are within normal limits. Mild left basilar atelectasis. No focal consolidation, sizeable pleural effusion, or pneumothorax. No acute osseous abnormality. IMPRESSION: 1. Endotracheal tube tip is located approximately 3.3 cm above the carina. 2. Mild left basilar atelectasis. Otherwise, no acute cardiopulmonary findings. Electronically Signed   By: Harrietta Sherry M.D.   On: 05/09/2023 12:54      Subjective: No significant events overnight, he had a good night sleep, he denies any complaints today.  Discharge Exam: Vitals:   05/22/23 0354 05/22/23 0800  BP: (!) 169/88 (!) 150/93  Pulse: 96 94  Resp: 18 18  Temp: 99 F (37.2 C) 98.8 F (37.1 C)  SpO2: 98% 98%   Vitals:   05/22/23 0000 05/22/23 0354 05/22/23 0700 05/22/23 0800  BP:  (!) 169/88  (!) 150/93  Pulse:  96  94  Resp:  18  18  Temp: 99.2 F (37.3 C) 99 F (37.2 C)  98.8 F (37.1 C)  TempSrc: Oral Oral  Oral  SpO2:  98%  98%  Weight:   84.9 kg   Height:        Awake Alert, Oriented X 3, more appropriate and communicative today, but he remains with mild impaired cognition  and insight no new F.N deficits, Normal affect Symmetrical Chest wall movement, Good air movement bilaterally, CTAB RRR,No Gallops,Rubs or new Murmurs, No Parasternal Heave +ve B.Sounds, Abd Soft, No tenderness, No rebound - guarding or rigidity. No Cyanosis, Clubbing or edema, right BKA      The results of significant diagnostics from this hospitalization (including imaging, microbiology, ancillary and laboratory) are listed below for reference.     Microbiology: No results found for this or any previous visit (from the past 240 hours).   Labs: BNP (last 3 results) No results for input(s): BNP in the last 8760 hours. Basic Metabolic Panel: Recent Labs  Lab 05/18/23 0910 05/19/23 0529 05/20/23 0523 05/21/23 0534 05/22/23 0422  NA 139 140 138 138 137  K 4.0 4.2 3.9 3.7 3.4*  CL 108 108 109 108 107  CO2 24 21* 21* 22 22  GLUCOSE 245* 223* 198* 156* 144*  BUN 29* 27* 24* 23 21  CREATININE 2.46* 2.29* 2.04* 2.04* 2.04*  CALCIUM  7.8* 7.7* 7.9* 7.9* 7.8*   Liver Function Tests: Recent Labs  Lab 05/17/23 0432  AST 34  ALT 40  ALKPHOS 52  BILITOT 0.7  PROT 5.7*  ALBUMIN  1.9*   No results for input(s): LIPASE, AMYLASE in the last 168 hours. No results for input(s): AMMONIA in the last 168 hours. CBC: Recent Labs  Lab 05/18/23 0910 05/19/23 0529 05/20/23 0523 05/21/23 0534 05/22/23 0422  WBC 4.4 5.1 5.8 5.5 5.3  NEUTROABS 2.8 2.9 3.6 3.2 3.3  HGB 8.0* 8.2* 8.9* 8.7* 8.6*  HCT 24.4* 26.2* 27.5* 26.7* 26.3*  MCV 74.6* 77.3* 76.4* 76.1* 75.6*  PLT 249 245 270 266 275   Cardiac Enzymes: No results for input(s): CKTOTAL, CKMB, CKMBINDEX, TROPONINI in the last 168 hours. BNP: Invalid input(s): POCBNP CBG: Recent Labs  Lab 05/21/23 1205 05/21/23 1618 05/21/23 2003 05/22/23 0802 05/22/23 0816  GLUCAP 228* 297* 257* 156* 168*   D-Dimer No results for input(s): DDIMER in the last 72 hours. Hgb A1c No results for input(s): HGBA1C in the  last 72 hours. Lipid Profile No results for input(s): CHOL, HDL, LDLCALC, TRIG, CHOLHDL, LDLDIRECT in the last 72 hours. Thyroid function studies No results for input(s): TSH, T4TOTAL, T3FREE, THYROIDAB in the last 72 hours.  Invalid input(s): FREET3 Anemia work up No results for input(s): VITAMINB12, FOLATE, FERRITIN, TIBC, IRON , RETICCTPCT in the last 72 hours. Urinalysis    Component Value Date/Time   COLORURINE YELLOW 05/09/2023 1104   APPEARANCEUR HAZY (A) 05/09/2023 1104   LABSPEC 1.020 05/09/2023 1104   PHURINE 5.0 05/09/2023 1104   GLUCOSEU >=500 (A) 05/09/2023 1104   HGBUR LARGE (A) 05/09/2023 1104   BILIRUBINUR NEGATIVE 05/09/2023 1104   KETONESUR 5 (A) 05/09/2023 1104   PROTEINUR >=300 (A) 05/09/2023 1104   NITRITE NEGATIVE 05/09/2023 1104   LEUKOCYTESUR TRACE (A) 05/09/2023 1104   Sepsis Labs Recent Labs  Lab 05/19/23 0529 05/20/23 0523 05/21/23 0534 05/22/23 0422  WBC 5.1 5.8 5.5 5.3   Microbiology No results found for this or any previous visit (from the past 240 hours).   Time coordinating discharge: Over 30 minutes  SIGNED:   Brayton Lye, MD  [facility] 05/22/2023, 11:28 AM Pager   If 7PM-7AM, please contact night-coverage [email or web address] Password [email or web address]

## 2023-05-22 NOTE — Consult Note (Signed)
 WOC Nurse Consult Note: Reason for Consult: coccyx and L arm wounds  Wound type:1.  Full thickness L elbow unknown etiology  2.  Unstageable Pressure Injury coccyx  Pressure Injury POA: no, charted as Stage 2 on 05/15/2023  Measurement: 1.  L elbow with 2 separate areas 3 cm x 4 cm 30% pink dry, 50% tan dry 20% black eschar, superior to this 3 cm x 3 cm 80% pink dry 20% tan fibrin dry  Wound bed: as above  Drainage (amount, consistency, odor) none  Periwound: intact  Dressing procedure/placement/frequency: Clean coccyx and L elbow wounds with NS, apply Medihoney to wound beds daily.  Cover with dry gauze and silicone foam.  May lift foam daily to replace Medihoney.  Change foam q3 days and prn soiling.   WOC team will follow every 7-10 days to re-evaluate and change wound care as needed.   Thank you,    Powell Bar MSN, RN-BC, [employer] [phone]

## 2023-05-22 NOTE — Progress Notes (Signed)
 Nutrition Follow-up  DOCUMENTATION CODES:   Not applicable  INTERVENTION:  Liberalize to regular diet to encourage PO intake and prevent repeat CBGs Encourage PO intake Feeding assistance Add preferences to dining software Ensure Enlive po TID, each supplement provides 350 kcal and 20 grams of protein. MVI with minerals daily    NUTRITION DIAGNOSIS:   Inadequate oral intake related to inability to eat as evidenced by NPO status.    GOAL:   Patient will meet greater than or equal to 90% of their needs    MONITOR:   PO intake, Supplement acceptance, I & O's, Labs, Weight trends  REASON FOR ASSESSMENT:   Consult Enteral/tube feeding initiation and management  ASSESSMENT:   Pt with hx of DM type 2, HTN, HLD, and R BKA presented to ED from home. Family had not heard from him in several days. First responders found pt wedged between bed and wall actively seizing. Intubated on arrival to ED.  Patient had no complaints this day.  Stated appetite was good.  Pt to discharge per rounds.   12/31 Found unresponsive, with active seizures per EMS. Intubated upon arrival to ED  > Intubated, minimal sedation -COVID-positive Arterial line, Right IJ central line, Foley catheter.  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  Several chronic microhemorrhages and supratentorial brain 01/01 EEG with severe diffuse encephalopathy 01/02 renal function worsening, neuro consult with mentation not improving 01/03 mentation improved, following commands, overnight EEG without seizures 1/4 extubated Hospital weight history: 05/22/23 0700 84.9 kg 187.17 lbs  05/19/23 0337 88.9 kg 195.99 lbs  05/18/23 0500 89 kg 196.21 lbs  05/17/23 0500 90.5 kg 199.52 lbs  05/13/23 0436 94.1 kg 207.45 lbs  05/11/23 0406 91.1 kg 200.84 lbs  05/10/23 0500 85 kg 187.39 lbs  05/09/23 10:38:09 88.5 kg 195 lbs    Average Meal Intake: 50-75: 58% intake x 3 recorded  meals  Nutritionally Relevant Medications: Scheduled Meds:  apixaban   2.5 mg Oral BID   carvedilol   25 mg Oral BID WC   vitamin B-12  500 mcg Oral Daily   docusate sodium   100 mg Oral BID   feeding supplement  237 mL Oral TID BM   folic acid   1 mg Oral Daily   multivitamin with minerals  1 tablet Oral Daily   senna  2 tablet Oral QHS      Labs Reviewed    NUTRITION - FOCUSED PHYSICAL EXAM:  Flowsheet Row Most Recent Value  Orbital Region No depletion  Upper Arm Region No depletion  Thoracic and Lumbar Region No depletion  Buccal Region No depletion  Temple Region No depletion  Clavicle Bone Region No depletion  Clavicle and Acromion Bone Region No depletion  Scapular Bone Region No depletion  Dorsal Hand Unable to assess  [mittens]  Patellar Region No depletion  Anterior Thigh Region No depletion  Posterior Calf Region Mild depletion  Edema (RD Assessment) Mild  Hair Reviewed  Eyes Reviewed  Mouth Reviewed  Skin Reviewed  Nails Unable to assess  [mittens]       Diet Order:   Diet Order             Diet - low sodium heart healthy           Diet Carb Modified Fluid consistency: Thin; Room service appropriate? Yes  Diet effective now                   EDUCATION NEEDS:  Education needs have been addressed  Skin:  Skin Assessment: Reviewed RN Assessment  Last BM:  1/12 type 5  Height:   Ht Readings from Last 1 Encounters:  05/09/23 5' 11 (1.803 m)    Weight:   Wt Readings from Last 1 Encounters:  05/22/23 84.9 kg    Ideal Body Weight:  73.6 kg (adjusted by 5.9% for BKA)  BMI:  Body mass index is 26.11 kg/m.  Estimated Nutritional Needs:   Kcal:  2100-2300 kcal/d  Protein:  105-120g/d  Fluid:  2.1-2.3L/d    Jenna Pew RDN, LDN Clinical Dietitian   If unable to reach, please contact RD Inpatient secure chat group between 8 am-4 pm daily

## 2023-07-13 NOTE — Progress Notes (Signed)
 PATIENT NAVIGATOR PROGRESS NOTE  Name: Aayush Gelpi Date: 07/13/2023 MRN: [id number]  DOB: May 08, 1946   Reason for visit:  Initial phone call  Comments:    Called and spoke to patient's niece Neva Seat, who is patient's POA and informed her that we have received a referral from Dr. Judie Petit. Arredondo's office.  Informed niece that his referral was reviewed by our medical oncologist and that a scheduler would reach out to schedule patient in our office within the next week.     Time spent counseling/coordinating care: 15-30 minutes

## 2023-07-14 ENCOUNTER — Inpatient Hospital Stay

## 2023-07-19 ENCOUNTER — Inpatient Hospital Stay: Attending: Hematology | Admitting: Hematology

## 2023-07-19 ENCOUNTER — Inpatient Hospital Stay

## 2023-07-19 ENCOUNTER — Encounter: Payer: Self-pay | Admitting: Hematology

## 2023-07-19 VITALS — BP 134/82 | HR 82 | Temp 98.4°F | Resp 20 | Ht 71.0 in | Wt 197.4 lb

## 2023-07-19 DIAGNOSIS — Z7901 Long term (current) use of anticoagulants: Secondary | ICD-10-CM | POA: Diagnosis not present

## 2023-07-19 DIAGNOSIS — E785 Hyperlipidemia, unspecified: Secondary | ICD-10-CM | POA: Diagnosis not present

## 2023-07-19 DIAGNOSIS — Z794 Long term (current) use of insulin: Secondary | ICD-10-CM | POA: Diagnosis not present

## 2023-07-19 DIAGNOSIS — N189 Chronic kidney disease, unspecified: Secondary | ICD-10-CM | POA: Insufficient documentation

## 2023-07-19 DIAGNOSIS — I129 Hypertensive chronic kidney disease with stage 1 through stage 4 chronic kidney disease, or unspecified chronic kidney disease: Secondary | ICD-10-CM | POA: Insufficient documentation

## 2023-07-19 DIAGNOSIS — G629 Polyneuropathy, unspecified: Secondary | ICD-10-CM | POA: Diagnosis not present

## 2023-07-19 DIAGNOSIS — I4891 Unspecified atrial fibrillation: Secondary | ICD-10-CM | POA: Insufficient documentation

## 2023-07-19 DIAGNOSIS — E1122 Type 2 diabetes mellitus with diabetic chronic kidney disease: Secondary | ICD-10-CM | POA: Diagnosis not present

## 2023-07-19 DIAGNOSIS — C186 Malignant neoplasm of descending colon: Secondary | ICD-10-CM | POA: Insufficient documentation

## 2023-07-19 DIAGNOSIS — C185 Malignant neoplasm of splenic flexure: Secondary | ICD-10-CM | POA: Diagnosis present

## 2023-07-19 DIAGNOSIS — R600 Localized edema: Secondary | ICD-10-CM

## 2023-07-19 NOTE — Progress Notes (Signed)
 [facility]   [phone] [phone]   Clinic New Consult Note   Patient Care Team: Frederich Chick., MD as PCP - General (Family Medicine) 07/19/2023  CHIEF COMPLAINTS/PURPOSE OF CONSULTATION:  Colon cancer  REFERRING PHYSICIAN: Dr. Orlin Hilding  Discussed the use of AI scribe software for clinical note transcription with the patient, who gave verbal consent to proceed.  History of Present Illness   A 78 year old patient with a complex medical history including uncontrolled type II diabetes, hypertension, hyperlipidemia, atrial fibrillation, chronic kidney disease, and a recent severe illness requiring ICU care and intubation, presents for discussion of recently recently diagnosed colon cancer.  Patient was referred by his surgeon Dr. Orlin Hilding and he presents to clinic with his niece [name].    He initially presented with intermittent rectal bleeding, underwent colonoscopy with Dr.Radha Gwendalyn Ege from [place] finding this non-obstructing cance in the splenic flexure of colon.  He also had a EGD which showed mild inflammation of gastric antrum and a polyp in the gastric body.  Staging CT scan was negative for distant metastasis.  He was referred to Dr. Orlin Hilding in early December 2024, to consider surgical resection.  He ordered a PET scan which was also negative for distant metastasis.  Unfortunately patient was hospitalized in [place] on May 09, 2023, he was found unresponsive with active seizures, intubated in the hospital and found to have COVID.  He was treated for severe sepsis, acute metabolic encephalopathy, and did not improve significantly.  He was discharged to SNF after 2 weeks of hospital stay, and went home after 2 weeks of rehab dilatation.  He is receiving home PT, stronger, he is able to ambulate with walker.  He is able to do self-care, still needs assistance for housework.  He previously live independently.   He has a poorly controlled  type 2 diabetes, on Lantus and Humalog.  He is A1c was 12% last year, it has come down to 9.2 in mid February 2025.  He underwent right BKA in 2022.The patient reports neuropathy in both hands and swelling in the left foot, which has been ongoing since a lower limb amputation nearly two years ago. The patient denies any pain, cough, shortness of breath, or bloating. He reports occasional blood in the stool but denies any changes in bowel habits.         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   Sepsis (HCC) 11/27/2020    SURGICAL HISTORY: Past Surgical History:  Procedure Laterality Date   AMPUTATION Right 12/04/2020   Procedure: RIGHT BELOW KNEE AMPUTATION;  Surgeon: Nadara Mustard, MD;  Location: [facility] OR;  Service: Orthopedics;  Laterality: Right;   APPENDECTOMY     APPLICATION OF WOUND VAC Right 12/04/2020   Procedure: APPLICATION OF WOUND VAC;  Surgeon: Nadara Mustard, MD;  Location: [facility] OR;  Service: Orthopedics;  Laterality: Right;    SOCIAL HISTORY: Social History   Socioeconomic History   Marital status: Married    Spouse name: Not on file   Number of children: 6   Years of education: Not on file   Highest education level: Not on file  Occupational History   Not on file  Tobacco Use   Smoking status: Never   Smokeless tobacco: Never  Vaping Use   Vaping status: Never Used  Substance  and Sexual Activity   Alcohol use: Never   Drug use: Never   Sexual activity: Not on file  Other Topics Concern   Not on file  Social History Narrative   Not on file   Social Drivers of Health   Financial Resource Strain: Not on file  Food Insecurity: Patient Unable To Answer (05/10/2023)   Hunger Vital Sign    Worried About Running Out of Food in the Last Year: Patient unable to answer    Ran Out of Food in the Last Year: Patient  unable to answer  Transportation Needs: Patient Unable To Answer (05/10/2023)   PRAPARE - Transportation    Lack of Transportation (Medical): Patient unable to answer    Lack of Transportation (Non-Medical): Patient unable to answer  Physical Activity: Not on file  Stress: Not on file  Social Connections: Patient Unable To Answer (05/10/2023)   Social Connection and Isolation Panel [NHANES]    Frequency of Communication with Friends and Family: Patient unable to answer    Frequency of Social Gatherings with Friends and Family: Patient unable to answer    Attends Religious Services: Patient unable to answer    Active Member of Clubs or Organizations: Patient unable to answer    Attends Banker Meetings: Patient unable to answer    Marital Status: Patient unable to answer  Intimate Partner Violence: Patient Unable To Answer (05/10/2023)   Humiliation, Afraid, Rape, and Kick questionnaire    Fear of Current or Ex-Partner: Patient unable to answer    Emotionally Abused: Patient unable to answer    Physically Abused: Patient unable to answer    Sexually Abused: Patient unable to answer    FAMILY HISTORY: Family History  Problem Relation Age of Onset   Diabetes Mother    Diabetes Father    Cancer Sister 38       breast cancer    ALLERGIES:  has no known allergies.  MEDICATIONS:  Current Outpatient Medications  Medication Sig Dispense Refill   acetaminophen (TYLENOL) 325 MG tablet Take 2 tablets (650 mg total) by mouth every 6 (six) hours as needed for mild pain, headache or fever (or Fever >/= 101).     apixaban (ELIQUIS) 2.5 MG TABS tablet Take 1 tablet (2.5 mg total) by mouth 2 (two) times daily.     atorvastatin (LIPITOR) 20 MG tablet Take 20 mg by mouth at bedtime.     carvedilol (COREG) 25 MG tablet Take 25 mg by mouth 2 (two) times daily with a meal.     cyanocobalamin (VITAMIN B12) 500 MCG tablet Take 1 tablet (500 mcg total) by mouth daily.     docusate sodium  (COLACE) 100 MG capsule Take 1 capsule (100 mg total) by mouth 2 (two) times daily.     feeding supplement (ENSURE ENLIVE / ENSURE PLUS) LIQD Take 237 mLs by mouth 3 (three) times daily between meals.     folic acid (FOLVITE) 1 MG tablet Take 1 tablet (1 mg total) by mouth daily.     insulin aspart (NOVOLOG) 100 UNIT/ML injection Inject 0-5 Units into the skin at bedtime. CBG 70 - 120: 0 units CBG 121 - 150: 0 units CBG 151 - 200: 0 units CBG 201 - 250: 2 units CBG 251 - 300: 3 units CBG 301 - 350: 4 units CBG 351 - 400: 5 units     insulin aspart (NOVOLOG) 100 UNIT/ML injection Inject 0-9 Units into the skin 3 (three) times daily with  meals. CBG < 70: Implement Hypoglycemia Standing Orders and refer to Hypoglycemia Standing Orders sidebar report CBG 70 - 120: 0 units CBG 121 - 150: 1 unit CBG 151 - 200: 2 units CBG 201 - 250: 3 units CBG 251 - 300: 5 units CBG 301 - 350: 7 units CBG 351 - 400: 9 units CBG > 400: call MD and obtain STAT lab verification     insulin glargine-yfgn (SEMGLEE) 100 UNIT/ML injection Inject 0.08 mLs (8 Units total) into the skin daily.     leptospermum manuka honey (MEDIHONEY) PSTE paste Apply 1 Application topically daily.     Multiple Vitamin (MULTIVITAMIN WITH MINERALS) TABS tablet Take 1 tablet by mouth daily.     pantoprazole (PROTONIX) 40 MG tablet Take 1 tablet (40 mg total) by mouth 2 (two) times daily.     polyethylene glycol (MIRALAX / GLYCOLAX) 17 g packet Take 17 g by mouth daily as needed.     senna (SENOKOT) 8.6 MG TABS tablet Take 2 tablets (17.2 mg total) by mouth at bedtime.     tamsulosin (FLOMAX) 0.4 MG CAPS capsule Take 1 capsule (0.4 mg total) by mouth daily.     Vitamin D, Ergocalciferol, (DRISDOL) 1.25 MG (50000 UNIT) CAPS capsule Take 50,000 Units by mouth every Sunday.     No current facility-administered medications for this visit.    REVIEW OF SYSTEMS:   Constitutional: Denies fevers, chills or abnormal night sweats Eyes: Denies blurriness of  vision, double vision or watery eyes Ears, nose, mouth, throat, and face: Denies mucositis or sore throat Respiratory: Denies cough, dyspnea or wheezes Cardiovascular: Denies palpitation, chest discomfort or lower extremity swelling Gastrointestinal:  Denies nausea, heartburn or change in bowel habits Skin: Denies abnormal skin rashes Lymphatics: Denies new lymphadenopathy or easy bruising Neurological:Denies numbness, tingling or new weaknesses Behavioral/Psych: Mood is stable, no new changes  All other systems were reviewed with the patient and are negative.  PHYSICAL EXAMINATION: ECOG PERFORMANCE STATUS: 2 - Symptomatic, <50% confined to bed  Vitals:   07/19/23 1515 07/19/23 1519  BP: (!) 146/96 134/82  Pulse: 82   Resp: 20   Temp: 98.4 F (36.9 C)   SpO2: 97%    Filed Weights   07/19/23 1515  Weight: 197 lb 6.4 oz (89.5 kg)    GENERAL:alert, no distress and comfortable SKIN: skin color, texture, turgor are normal, no rashes or significant lesions EYES: normal, conjunctiva are pink and non-injected, sclera clear OROPHARYNX:no exudate, no erythema and lips, buccal mucosa, and tongue normal  NECK: supple, thyroid normal size, non-tender, without nodularity LYMPH:  no palpable lymphadenopathy in the cervical, axillary or inguinal LUNGS: clear to auscultation and percussion with normal breathing effort HEART: regular rate & rhythm and no murmurs and no lower extremity edema ABDOMEN:abdomen soft, non-tender and normal bowel sounds Musculoskeletal:no cyanosis of digits and no clubbing  PSYCH: alert & oriented x 3 with fluent speech NEURO: no focal motor/sensory deficits  Physical Exam   EXTREMITIES: Swelling in extremities.      LABORATORY DATA:  I have reviewed the data as listed    Latest Ref Rng & Units 05/22/2023    4:22 AM 05/21/2023    5:34 AM 05/20/2023    5:23 AM  CBC  WBC 4.0 - 10.5 K/uL 5.3  5.5  5.8   Hemoglobin 13.0 - 17.0 g/dL 8.6  8.7  8.9   Hematocrit  39.0 - 52.0 % 26.3  26.7  27.5   Platelets 150 - 400 K/uL  275  266  270       Latest Ref Rng & Units 05/22/2023    4:22 AM 05/21/2023    5:34 AM 05/20/2023    5:23 AM  CMP  Glucose 70 - 99 mg/dL 409  811  914   BUN 8 - 23 mg/dL 21  23  24    Creatinine 0.61 - 1.24 mg/dL 7.82  9.56  2.13   Sodium 135 - 145 mmol/L 137  138  138   Potassium 3.5 - 5.1 mmol/L 3.4  3.7  3.9   Chloride 98 - 111 mmol/L 107  108  109   CO2 22 - 32 mmol/L 22  22  21    Calcium 8.9 - 10.3 mg/dL 7.8  7.9  7.9      RADIOGRAPHIC STUDIES: I have personally reviewed the radiological images as listed and agreed with the findings in the report. No results found.  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  He is recovering slowly from that event.  Colon cancer, [id number] Newly diagnosed left-sided colon cancer with no metastasis on PET scan in 04/2023. Previously ineligible for surgery due to severe illness in December, but now a candidate. Concerns about diabetes and kidney function affecting surgical complications. Anticipated laparoscopic surgery without colostomy. Potential need for chemotherapy post-surgery if lymph nodes are involved. Importance of optimizing diabetes management to reduce surgical risks emphasized. -Due to his multiple serious medical comorbidities, his surgeon Dr. Orlin Hilding is very concerned about postop complications, and recommended him to have surgery in [facility] system. - Refer to surgeon at [facility] colonic surgery -I spoke with Dr. Jennet Maduro - Monitor diabetes and kidney function to optimize surgical outcomes - Plan post-surgery consultation to discuss potential chemotherapy  Diabetes mellitus Diabetes with recent A1c of 9.2, previously 12.5. Blood sugar levels fluctuate,  with low morning and high postprandial levels. High A1c may impact wound healing and increase surgical complications. Emphasized need to improve A1c to reduce surgical risks. - Encourage follow-up with primary care for diabetes management - Aim to improve A1c to reduce surgical risks  Chronic kidney disease Chronic kidney disease with fluctuating function. Recent labs show worsening function. Concerns about impact on surgical clearance and outcomes. - Coordinate with nephrologist for surgical clearance - Monitor kidney function closely  Atrial fibrillation Atrial fibrillation managed with Eliquis. No recent episodes reported. 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 Persistent swelling in left foot since amputation in June 2023. No acute signs of blood clots, further assessment needed. - Schedule Doppler ultrasound to assess for blood clots - Consider referral to cardiologist for management of swelling and neuropathy     Plan -I reviewed his chart including outside medical records extensively. -I spoke with his surgeon Dr. Jennet Maduro -Will refer him to [facility] -I will see him back after surgery. -Left lower extremity Doppler to rule out DVT.    No orders of the defined types were placed in this encounter.   All questions were answered. The patient knows to call the clinic with any problems, questions or concerns. I spent 45 minutes counseling the patient face to face. The total time spent in the appointment was 60 minutes and more than 50% was on counseling.  Malachy Mood, MD 07/19/2023 5:02 PM

## 2023-07-20 NOTE — Progress Notes (Signed)
 PATIENT NAVIGATOR PROGRESS NOTE  Name: Emir Nack Date: 07/20/2023 MRN: [id number]  DOB: [birth year]   Reason for visit:  New patient appointment  Comments:  Patient in with niece [name] for new patient visit with Dr. Mosetta Putt on 07/19/23.  Patient given [facility] binder with diagnosis information. Patient also given my business card with contact information.    Patient's niece was given phone number to schedule patient's LLE doppler.    Patient and niece instructed to contact office with any questions or concerns.    Time spent counseling/coordinating care: 30-45 minutes

## 2023-07-31 ENCOUNTER — Ambulatory Visit (HOSPITAL_COMMUNITY)
Admission: RE | Admit: 2023-07-31 | Discharge: 2023-07-31 | Disposition: A | Discharge status: Home / Self Care | Source: Ambulatory Visit | Attending: Hematology | Admitting: Hematology

## 2023-07-31 DIAGNOSIS — R600 Localized edema: Secondary | ICD-10-CM | POA: Diagnosis not present

## 2023-07-31 NOTE — Progress Notes (Signed)
 Left lower extremity venous duplex has been completed. Preliminary results can be found in CV Proc through chart review.  Results were given to Dr. Mosetta Putt.  07/31/23 10:10 AM Olen Cordial RVT

## 2023-08-14 ENCOUNTER — Ambulatory Visit: Payer: Self-pay | Admitting: Surgery

## 2023-08-14 ENCOUNTER — Encounter: Payer: Self-pay | Admitting: Surgery

## 2023-08-14 DIAGNOSIS — D509 Iron deficiency anemia, unspecified: Secondary | ICD-10-CM | POA: Insufficient documentation

## 2023-08-14 DIAGNOSIS — N1832 Chronic kidney disease, stage 3b: Secondary | ICD-10-CM

## 2023-08-14 DIAGNOSIS — Z87898 Personal history of other specified conditions: Secondary | ICD-10-CM | POA: Insufficient documentation

## 2023-08-14 DIAGNOSIS — C186 Malignant neoplasm of descending colon: Secondary | ICD-10-CM

## 2023-08-14 DIAGNOSIS — R739 Hyperglycemia, unspecified: Secondary | ICD-10-CM

## 2023-08-14 DIAGNOSIS — L89152 Pressure ulcer of sacral region, stage 2: Secondary | ICD-10-CM | POA: Insufficient documentation

## 2023-08-14 DIAGNOSIS — Z22358 Carrier of other enterobacterales: Secondary | ICD-10-CM | POA: Insufficient documentation

## 2023-08-14 DIAGNOSIS — N184 Chronic kidney disease, stage 4 (severe): Secondary | ICD-10-CM | POA: Insufficient documentation

## 2023-08-15 ENCOUNTER — Telehealth: Payer: Self-pay

## 2023-08-15 NOTE — Telephone Encounter (Signed)
 Called and spoke to patient's niece Lajean Saver) and informed her of patient's doppler results.  Niece verbalized understanding and agreed to share information with patient.    Niece stated patient saw Dr. Michaell Cowing [facility]) on 4/7 and has a few more preop testing appts before they are able to schedule his surgery. Niece agreed to contact office once surgery date is set to arrange for follow-up appointment with our office.    All questions were answered during the phone call.

## 2023-08-15 NOTE — Telephone Encounter (Signed)
-----   Message from Pollyann Samples sent at 08/15/2023  9:37 AM EDT ----- Please let pt know doppler is negative, no DVT.   Wilkie Aye, please follow pt and arrange post op f/up with one of [place].   Thanks Lacie NP

## 2023-08-16 NOTE — Progress Notes (Addendum)
 Cardiology Office Note:   Date:  08/18/2023  ID:  Cory [name], DOB 04/26/1946, MRN [id number] PCP:  Janese Medicine., MD  [facility] Providers Cardiologist:  Alyssa Backbone, MD Referring MD: Barbaraann Bookbinder, Rosea Conch, *  Chief Complaint/Reason for Referral: Hypertension ASSESSMENT:    1. Type 2 diabetes mellitus with complication, with long-term current use of insulin (HCC)   2. Hypertension associated with diabetes (HCC)   3. Hyperlipidemia associated with type 2 diabetes mellitus (HCC)   4. CKD stage 3 due to type 2 diabetes mellitus (HCC)   5. Hx of right BKA (HCC)   6. Typical atrial flutter (HCC)   7. Secondary hypercoagulable state (HCC)   8. Preoperative cardiovascular examination   9. BMI 25.0-25.9,adult     PLAN:   In order of problems listed above: Type 2 diabetes mellitus: Continue Eliquis, atorvastatin 20 mg, start Micardis 40 mg daily; probably not an ideal candidate for Jardiance due to UTI risk (BKA). Hypertension: Start Micardi 40 mg daily and continue Coreg 25 mg twice daily stop sprionolactone 100mg .  Check BMP next week. Hyperlipidemia: Continue atorvastatin 20 mg; check lipid panel, LFTs, and LP(a) next week. CKD stage IIIb: Start Micardis 40 mg daily; not a candidate for Jardiance due to history of amputation History of BKA: Monitor for now.  Continue control of cardiovascular risk factors and diabetes part Typical atrial flutter: Increase Eliquis to 5 mg twice daily and Coreg 25 mg twice daily. Secondary hypercoagulable state: Increase Eliquis to 5 mg twice daily.  Creatinine is greater than 1.5 however patient's age is less than 80 and weight is greater than 60 kg.  Patient qualifies for Eliquis 5 mg twice daily. Preoperative cardiovascular assessment:  Being considered for resection of colonic mass.  Unclear functional capacity.  Will obtain echocardiogram and coronary CTA to evaluate further.  Only if high risk findings are seen would any further evaluation  be required. Elevated BMI: Could consider GLP-1 receptor antagonist in the future after cancer issues have been managed.            Dispo:  Return in about 6 months (around 02/17/2024).       Medication Adjustments/Labs and Tests Ordered: Current medicines are reviewed at length with the patient today.  Concerns regarding medicines are outlined above.  The following changes have been made:     Labs/tests ordered: Orders Placed This Encounter  Procedures   CT CORONARY MORPH W/CTA COR W/SCORE W/CA W/CM &/OR WO/CM   Comprehensive metabolic panel with GFR   Lipoprotein A (LPA)   Lipid panel   ECHOCARDIOGRAM COMPLETE    Medication Changes: Meds ordered this encounter  Medications   telmisartan (MICARDIS) 40 MG tablet    Sig: Take 1 tablet (40 mg total) by mouth daily.    Dispense:  90 tablet    Refill:  3   apixaban (ELIQUIS) 5 MG TABS tablet    Sig: Take 1 tablet (5 mg total) by mouth 2 (two) times daily.    Dispense:  60 tablet    Refill:  5    Dose increase    Current medicines are reviewed at length with the patient today.  The patient does not have concerns regarding medicines.  I spent 33 minutes reviewing all clinical data during and prior to this visit including all relevant imaging studies, laboratories, clinical information from other health systems and prior notes from both Cardiology and other specialties, interviewing the patient, conducting a complete physical examination, and coordinating care  in order to formulate a comprehensive and personalized evaluation and treatment plan.   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 The patient is a 78 year old male with the above listed medical problems referred for conditions regarding hypertension.  The patient was seen by his primary care provider recently and blood pressure was elevated.  He was started on hydralazine.  He is here for further recommendations.  His lifestyle is mostly sedentary, living in a 900 square foot apartment, which limits his physical activity, especially in cold weather. He is able to walk around his home, cook, use his exercise machine, and work on his computer. He can walk outside and manage stairs if necessary, although he prefers to stay on flat surfaces.  He monitors his blood pressure at home, noting variable readings, with a recent measurement of 132/60 mmHg. No headaches or other symptoms are associated with these fluctuations. He is not currently on Micardis, a medication that could benefit his blood pressure and kidney function.  He has been recently started on spironolactone, which has helped reduce swelling in his foot, previously described as swollen, tight, and warm. An ultrasound showed no blockage, and the swelling has improved since starting the medication.  His past medical history includes diabetes and a previous amputation. He is currently on spironolactone and Coreg. He was previously on hydralazine, which was stopped. He is also on Eliquis, with a current dose of 2.5 mg.  He has had no bleeding issues with this.         Current Medications: Current Meds  Medication Sig   acetaminophen (TYLENOL) 325 MG tablet Take 2 tablets (650 mg total) by mouth every 6 (six) hours as needed for mild pain, headache or fever (or Fever >/= 101).   apixaban (ELIQUIS) 5 MG TABS tablet Take 1 tablet (5 mg total) by mouth 2 (two) times daily.   atorvastatin (LIPITOR) 20 MG tablet Take 20 mg by mouth at bedtime.   carvedilol (COREG) 25 MG tablet Take 25 mg by mouth 2 (two) times daily with a meal.   cyanocobalamin  (VITAMIN B12) 500 MCG tablet Take 1 tablet (500 mcg total) by mouth daily.   docusate sodium (COLACE) 100 MG capsule Take 1 capsule (100 mg total) by mouth 2 (two) times daily.   feeding supplement (ENSURE ENLIVE / ENSURE PLUS) LIQD Take 237 mLs by mouth 3 (three) times daily between meals.   folic acid (FOLVITE) 1 MG tablet Take 1 tablet (1 mg total) by mouth daily.   insulin aspart (NOVOLOG) 100 UNIT/ML injection Inject 0-5 Units into the skin at bedtime. CBG 70 - 120: 0 units CBG 121 - 150: 0 units CBG 151 - 200: 0 units CBG 201 - 250: 2 units CBG 251 - 300: 3 units CBG 301 - 350: 4 units CBG 351 - 400: 5 units   insulin aspart (NOVOLOG) 100 UNIT/ML injection Inject 0-9 Units into the skin 3 (three) times daily with meals. CBG < 70: Implement Hypoglycemia Standing Orders and refer to Hypoglycemia Standing Orders sidebar report CBG 70 - 120: 0 units CBG 121 - 150: 1 unit CBG 151 - 200: 2 units CBG  201 - 250: 3 units CBG 251 - 300: 5 units CBG 301 - 350: 7 units CBG 351 - 400: 9 units CBG > 400: call MD and obtain STAT lab verification   insulin glargine-yfgn (SEMGLEE) 100 UNIT/ML injection Inject 0.08 mLs (8 Units total) into the skin daily.   LANTUS SOLOSTAR 100 UNIT/ML Solostar Pen 45 units by sub-q route.   leptospermum manuka honey (MEDIHONEY) PSTE paste Apply 1 Application topically daily.   Multiple Vitamin (MULTIVITAMIN WITH MINERALS) TABS tablet Take 1 tablet by mouth daily.   pantoprazole (PROTONIX) 40 MG tablet Take 1 tablet (40 mg total) by mouth 2 (two) times daily.   polyethylene glycol (MIRALAX / GLYCOLAX) 17 g packet Take 17 g by mouth daily as needed.   potassium chloride (KLOR-CON) 10 MEQ tablet Take by mouth.   senna (SENOKOT) 8.6 MG TABS tablet Take 2 tablets (17.2 mg total) by mouth at bedtime.   tamsulosin (FLOMAX) 0.4 MG CAPS capsule Take 1 capsule (0.4 mg total) by mouth daily.   telmisartan (MICARDIS) 40 MG tablet Take 1 tablet (40 mg total) by mouth daily.   torsemide  (DEMADEX) 20 MG tablet    Vitamin D, Ergocalciferol, (DRISDOL) 1.25 MG (50000 UNIT) CAPS capsule Take 50,000 Units by mouth every Sunday.   [DISCONTINUED] apixaban (ELIQUIS) 2.5 MG TABS tablet Take 1 tablet (2.5 mg total) by mouth 2 (two) times daily.   [DISCONTINUED] spironolactone (ALDACTONE) 100 MG tablet      Review of Systems:   Please see the history of present illness.    All other systems reviewed and are negative.     EKGs/Labs/Other Test Reviewed:   EKG: EKG from December 2024 demonstrates typical atrial flutter  EKG Interpretation Date/Time:    Ventricular Rate:    PR Interval:    QRS Duration:    QT Interval:    QTC Calculation:   R Axis:      Text Interpretation:           Risk Assessment/Calculations:    CHA2DS2-VASc Score = 4   This indicates a 4.8% annual risk of stroke. The patient's score is based upon: CHF History: 0 HTN History: 1 Diabetes History: 1 Stroke History: 0 Vascular Disease History: 0 Age Score: 2 Gender Score: 0         Physical Exam:   VS:  BP (!) 145/85   Pulse 66   Ht 5\' 11"  (1.803 m)   Wt 186 lb (84.4 kg)   SpO2 98%   BMI 25.94 kg/m    HYPERTENSION CONTROL Vitals:   08/18/23 1011 08/18/23 1027  BP: (!) 154/108 (!) 145/85    The patient's blood pressure is elevated above target today.  In order to address the patient's elevated BP: A new medication was prescribed today.      Wt Readings from Last 3 Encounters:  08/18/23 186 lb (84.4 kg)  07/19/23 197 lb 6.4 oz (89.5 kg)  05/22/23 187 lb 2.7 oz (84.9 kg)      GENERAL:  No apparent distress, AOx3 HEENT:  No carotid bruits, +2 carotid impulses, no scleral icterus CAR: RRR no murmurs, gallops, rubs, or thrills RES:  Clear to auscultation bilaterally ABD:  Soft, nontender, nondistended, positive bowel sounds x 4 VASC:  +2 radial pulses, +2 carotid pulses NEURO:  CN 2-12 grossly intact; motor and sensory grossly intact PSYCH:  No active depression or  anxiety EXT: Right BKA; +1 edema left lower extremity Signed, Mishawn Didion K Nazli Penn, MD  08/18/2023  11:56 AM    [facility] [address] [place], [place], [place]  [address] Phone: [phone]; Fax: [phone]   Note:  This document was prepared using Dragon voice recognition software and may include unintentional dictation errors.

## 2023-08-18 ENCOUNTER — Ambulatory Visit: Attending: Cardiology | Admitting: Internal Medicine

## 2023-08-18 ENCOUNTER — Telehealth: Payer: Self-pay

## 2023-08-18 ENCOUNTER — Encounter: Payer: Self-pay | Admitting: Internal Medicine

## 2023-08-18 VITALS — BP 145/85 | HR 66 | Ht 71.0 in | Wt 186.0 lb

## 2023-08-18 DIAGNOSIS — E1159 Type 2 diabetes mellitus with other circulatory complications: Secondary | ICD-10-CM

## 2023-08-18 DIAGNOSIS — Z01810 Encounter for preprocedural cardiovascular examination: Secondary | ICD-10-CM

## 2023-08-18 DIAGNOSIS — Z89511 Acquired absence of right leg below knee: Secondary | ICD-10-CM

## 2023-08-18 DIAGNOSIS — E118 Type 2 diabetes mellitus with unspecified complications: Secondary | ICD-10-CM | POA: Diagnosis not present

## 2023-08-18 DIAGNOSIS — I152 Hypertension secondary to endocrine disorders: Secondary | ICD-10-CM | POA: Diagnosis not present

## 2023-08-18 DIAGNOSIS — E1169 Type 2 diabetes mellitus with other specified complication: Secondary | ICD-10-CM

## 2023-08-18 DIAGNOSIS — I483 Typical atrial flutter: Secondary | ICD-10-CM

## 2023-08-18 DIAGNOSIS — N183 Chronic kidney disease, stage 3 unspecified: Secondary | ICD-10-CM

## 2023-08-18 DIAGNOSIS — E1122 Type 2 diabetes mellitus with diabetic chronic kidney disease: Secondary | ICD-10-CM

## 2023-08-18 DIAGNOSIS — D6869 Other thrombophilia: Secondary | ICD-10-CM

## 2023-08-18 DIAGNOSIS — Z794 Long term (current) use of insulin: Secondary | ICD-10-CM

## 2023-08-18 DIAGNOSIS — E785 Hyperlipidemia, unspecified: Secondary | ICD-10-CM

## 2023-08-18 DIAGNOSIS — Z6825 Body mass index (BMI) 25.0-25.9, adult: Secondary | ICD-10-CM

## 2023-08-18 MED ORDER — APIXABAN 5 MG PO TABS: 5.0000 mg | ORAL_TABLET | Freq: Two times a day (BID) | ORAL | 5 refills | Status: DC

## 2023-08-18 MED ORDER — TELMISARTAN 40 MG PO TABS: 40.0000 mg | ORAL_TABLET | Freq: Every day | ORAL | 3 refills | Status: DC

## 2023-08-18 NOTE — Telephone Encounter (Addendum)
 Called patients niece to relay the message below as per Santiago Glad NP. She voiced full understanding and had no further questions at this time.  ----- Message from Pollyann Samples sent at 08/15/2023  9:37 AM EDT ----- Please let pt know doppler is negative, no DVT.   Wilkie Aye, please follow pt and arrange post op f/up with one of [place].   Thanks Lacie NP

## 2023-08-18 NOTE — Patient Instructions (Addendum)
 Medication Instructions:  Your physician has recommended you make the following change in your medication:  1.) stop spironolactone 2.) increase Eliquis to 5 mg - one tablet twice daily 3.) start telmisartan (Micardis) 40 mg - one tablet daily  *If you need a refill on your cardiac medications before your next appointment, please call your pharmacy*  Lab Work: 08/28/23 - blood work (cmp, lipids, lpa) You may go to any of these [facility] locations:   [facility] - [address] ([facility]) - [address] [id number] [address]   [place] - [address]    [place]  - [address]    [facility] - [address] - [address] [employer] [facility]  - [address] ([facility])  [place]   - [address]   Testing/Procedures: Your physician has requested that you have an echocardiogram. Echocardiography is a painless test that uses sound waves to create images of your heart. It provides your doctor with information about the size and shape of your heart and how well your heart's chambers and valves are working. This procedure takes approximately one hour. There are no restrictions for this procedure. Please do NOT wear cologne, perfume, aftershave, or lotions (deodorant is allowed). Please arrive 15 minutes prior to your appointment time.  Please note: We ask at that you not bring children with you during ultrasound (echo/ vascular) testing. Due to room size and safety concerns, children are not allowed in the ultrasound rooms during exams. Our front office staff cannot provide observation of children in our lobby area while testing is being conducted. An adult accompanying a patient to their appointment will only be allowed in the ultrasound room at the discretion of the ultrasound technician under special circumstances. We  apologize for any inconvenience.  Cardiac CT Angiogram - see instructions below.  Follow-Up: At [facility], you and your health needs are our priority.  As part of our continuing mission to provide you with exceptional heart care, our providers are all part of one team.  This team includes your primary Cardiologist (physician) and Advanced Practice Providers or APPs (Physician Assistants and Nurse Practitioners) who all work together to provide you with the care you need, when you need it.  Your next appointment:   6 month(s)  Provider:   Robin Searing, NP, Micah Flesher, PA-C, Ronie Spies, PA-C, Rise Paganini, NP, Jacolyn Reedy, PA-C, Azalee Course, PA-C, Bernadene Person, NP, Tereso Newcomer, PA-C, or Perlie Gold, PA-C          Your cardiac CT will be scheduled at [facility] [address] [place], [place] [address] [phone]   Please arrive 30 minutes early for check-in and test prep.  Please follow these instructions carefully (unless otherwise directed):  An IV will be required for this test and Nitroglycerin will be given.  Hold all erectile dysfunction medications at least 3 days (72 hrs) prior to test. (Ie viagra, cialis, sildenafil, tadalafil, etc)   On the Night Before the Test: Be sure to Drink plenty of water. Do not consume any caffeinated/decaffeinated beverages or chocolate 12 hours prior to your test. Do not take any antihistamines 12 hours prior to your test.  On the Day of the Test: Drink plenty of water until 1 hour prior to the test. Do not eat  any food 1 hour prior to test. You may take your regular medications prior to the test.  Take usual dose of carvedilol (Coreg) two hours prior to test. Please HOLD torsemide on the morning of the test. Patients who wear a continuous glucose monitor MUST remove the device prior to scanning.       After the Test: Drink plenty of water. After receiving IV contrast, you may experience a mild flushed  feeling. This is normal. On occasion, you may experience a mild rash up to 24 hours after the test. This is not dangerous. If this occurs, you can take Benadryl 25 mg, Zyrtec, Claritin, or Allegra and increase your fluid intake. (Patients taking Tikosyn should avoid Benadryl, and may take Zyrtec, Claritin, or Allegra) If you experience trouble breathing, this can be serious. If it is severe call 911 IMMEDIATELY. If it is mild, please call our office.  We will call to schedule your test 2-4 weeks out understanding that some insurance companies will need an authorization prior to the service being performed.   For more information and frequently asked questions, please visit our website : [email or web address]  For non-scheduling related questions, please contact the cardiac imaging nurse navigator should you have any questions/concerns: Cardiac Imaging Nurse Navigators Direct Office Dial: [phone]   For scheduling needs, including cancellations and rescheduling, please call [place], [phone].       1st Floor: - Lobby - Registration  - Pharmacy  - Lab - Cafe  2nd Floor: - PV Lab - Diagnostic Testing (echo, CT, nuclear med)  3rd Floor: - Vacant  4th Floor: - TCTS (cardiothoracic surgery) - AFib Clinic - Structural Heart Clinic - Vascular Surgery  - Vascular Ultrasound  5th Floor: - HeartCare Cardiology (general and EP) - Clinical Pharmacy for coumadin, hypertension, lipid, weight-loss medications, and med management appointments    Valet parking services will be available as well.

## 2023-08-22 ENCOUNTER — Ambulatory Visit (HOSPITAL_COMMUNITY): Attending: Internal Medicine

## 2023-08-22 ENCOUNTER — Encounter: Payer: Self-pay | Admitting: Internal Medicine

## 2023-08-22 ENCOUNTER — Other Ambulatory Visit (HOSPITAL_COMMUNITY)

## 2023-08-22 DIAGNOSIS — Z01810 Encounter for preprocedural cardiovascular examination: Secondary | ICD-10-CM | POA: Diagnosis present

## 2023-08-22 LAB — ECHOCARDIOGRAM COMPLETE
Area-P 1/2: 4.55 cm2
S' Lateral: 3 cm

## 2023-08-25 ENCOUNTER — Telehealth: Payer: Self-pay | Admitting: Internal Medicine

## 2023-08-25 ENCOUNTER — Telehealth (HOSPITAL_COMMUNITY): Payer: Self-pay | Admitting: Emergency Medicine

## 2023-08-25 DIAGNOSIS — Z01810 Encounter for preprocedural cardiovascular examination: Secondary | ICD-10-CM

## 2023-08-25 NOTE — Telephone Encounter (Signed)
 Phone call to patient to explain the limitations of our abilitiy to scan his heart with his poor kidney function. I told him that we cannot perform the CCTA with contrast due to his creatinine being too high and that I've notified his cardiology team this same information. He stated he was pending a procedure after this scan and disappointed that this will delay it. He also stated that he was going to notify his niece.   Pt denied further questions. Jinger Mount RN Navigator Cardiac Imaging [facility] Vascular Services [phone] Office  [phone] Cell

## 2023-08-25 NOTE — Telephone Encounter (Signed)
 Niece would like to speak to nurse about testing that was ordered

## 2023-08-25 NOTE — Telephone Encounter (Signed)
 Spoke with pt's niece (per verbal consent from pt). She is wondering about next steps since her uncle's CCTA was canceled due to his poor kidney function. Niece was told that her question would be forwarded to Dr. Wendel and his nurse. Niece verbalized understanding. All questions if any were answered.

## 2023-08-26 ENCOUNTER — Encounter: Payer: Self-pay | Admitting: Internal Medicine

## 2023-08-28 ENCOUNTER — Other Ambulatory Visit: Payer: Self-pay

## 2023-08-28 DIAGNOSIS — E1169 Type 2 diabetes mellitus with other specified complication: Secondary | ICD-10-CM

## 2023-08-28 DIAGNOSIS — I152 Hypertension secondary to endocrine disorders: Secondary | ICD-10-CM

## 2023-08-29 ENCOUNTER — Ambulatory Visit (HOSPITAL_BASED_OUTPATIENT_CLINIC_OR_DEPARTMENT_OTHER)

## 2023-08-29 ENCOUNTER — Encounter: Payer: Self-pay | Admitting: Internal Medicine

## 2023-08-29 LAB — COMPREHENSIVE METABOLIC PANEL WITH GFR
ALT: 14 IU/L (ref 0–44)
AST: 18 IU/L (ref 0–40)
Albumin: 3.4 g/dL — ABNORMAL LOW (ref 3.8–4.8)
Alkaline Phosphatase: 100 IU/L (ref 44–121)
BUN/Creatinine Ratio: 12 (ref 10–24)
BUN: 27 mg/dL (ref 8–27)
Bilirubin Total: <0.2 mg/dL (ref 0.0–1.2)
CO2: 23 mmol/L (ref 20–29)
Calcium: 8.9 mg/dL (ref 8.6–10.2)
Chloride: 102 mmol/L (ref 96–106)
Creatinine, Ser: 2.25 mg/dL — ABNORMAL HIGH (ref 0.76–1.27)
Globulin, Total: 2.9 g/dL (ref 1.5–4.5)
Glucose: 302 mg/dL — ABNORMAL HIGH (ref 70–99)
Potassium: 4.4 mmol/L (ref 3.5–5.2)
Sodium: 138 mmol/L (ref 134–144)
Total Protein: 6.3 g/dL (ref 6.0–8.5)
eGFR: 29 mL/min/{1.73_m2} — ABNORMAL LOW (ref >59)

## 2023-08-29 LAB — LIPID PANEL
Chol/HDL Ratio: 2.7 ratio (ref 0.0–5.0)
Cholesterol, Total: 151 mg/dL (ref 100–199)
HDL: 55 mg/dL (ref >39)
LDL Chol Calc (NIH): 65 mg/dL (ref 0–99)
Triglycerides: 191 mg/dL — ABNORMAL HIGH (ref 0–149)
VLDL Cholesterol Cal: 31 mg/dL (ref 5–40)

## 2023-08-29 LAB — LIPOPROTEIN A (LPA): Lipoprotein (a): 50.5 nmol/L (ref <75.0)

## 2023-08-29 NOTE — Telephone Encounter (Signed)
 Spoke with pt's niece (per DPR) regarding a Lexiscan Stress Test. A Lexiscan Stress Test was ordered and instructions sent to pt's MyChart and address per request. An attestation was pended.

## 2023-08-31 NOTE — Telephone Encounter (Signed)
 The patient's niece was notified of need for lexiscan myoview by triage RN.  2 calls have been placed to schedule  the test per the "active orders" notes.

## 2023-09-06 ENCOUNTER — Ambulatory Visit: Admitting: Cardiology

## 2023-09-07 ENCOUNTER — Telehealth: Payer: Self-pay | Admitting: *Deleted

## 2023-09-07 NOTE — Telephone Encounter (Signed)
 Left detailed instructions for MPI on George Kinder vm per DPR.

## 2023-09-12 ENCOUNTER — Telehealth (HOSPITAL_COMMUNITY): Payer: Self-pay

## 2023-09-12 NOTE — Telephone Encounter (Signed)
 The patient called in for instructions for his test on May 8 7:15. Detailed instructions given. He stated that he would be here for his test. Cory [name] CCT

## 2023-09-13 ENCOUNTER — Other Ambulatory Visit: Payer: Self-pay | Admitting: Internal Medicine

## 2023-09-13 DIAGNOSIS — Z01810 Encounter for preprocedural cardiovascular examination: Secondary | ICD-10-CM

## 2023-09-14 ENCOUNTER — Encounter: Payer: Self-pay | Admitting: Internal Medicine

## 2023-09-14 ENCOUNTER — Ambulatory Visit (HOSPITAL_COMMUNITY): Attending: Cardiology

## 2023-09-14 DIAGNOSIS — Z01810 Encounter for preprocedural cardiovascular examination: Secondary | ICD-10-CM | POA: Diagnosis present

## 2023-09-14 LAB — MYOCARDIAL PERFUSION IMAGING
LV dias vol: 101 mL (ref 62–150)
LV sys vol: 42 mL
Nuc Stress EF: 58 %
Peak HR: 106 {beats}/min
Rest HR: 76 {beats}/min
Rest Nuclear Isotope Dose: 10.3 mCi
SDS: 0
SRS: 0
SSS: 0
ST Depression (mm): 0 mm
Stress Nuclear Isotope Dose: 32.4 mCi
TID: 0.89

## 2023-09-14 MED ORDER — REGADENOSON 0.4 MG/5ML IV SOLN
0.4000 mg | Freq: Once | INTRAVENOUS | Status: AC
  Administered 2023-09-14: 0.4 mg via INTRAVENOUS

## 2023-09-14 MED ORDER — TECHNETIUM TC 99M TETROFOSMIN IV KIT
10.3000 | PACK | Freq: Once | INTRAVENOUS | Status: AC | PRN
Start: 2023-09-14 — End: 2023-09-14
  Administered 2023-09-14: 10.3 via INTRAVENOUS

## 2023-09-14 MED ORDER — TECHNETIUM TC 99M TETROFOSMIN IV KIT
32.4000 | PACK | Freq: Once | INTRAVENOUS | Status: AC | PRN
  Administered 2023-09-14: 32.4 via INTRAVENOUS

## 2023-09-14 MED ORDER — REGADENOSON 0.4 MG/5ML IV SOLN
INTRAVENOUS | Status: AC
  Filled 2023-09-14: qty 5

## 2023-09-15 ENCOUNTER — Ambulatory Visit: Admitting: Cardiology

## 2023-09-18 ENCOUNTER — Inpatient Hospital Stay (HOSPITAL_COMMUNITY)

## 2023-09-18 ENCOUNTER — Other Ambulatory Visit: Payer: Self-pay

## 2023-09-18 ENCOUNTER — Emergency Department (HOSPITAL_COMMUNITY)

## 2023-09-18 ENCOUNTER — Encounter (HOSPITAL_COMMUNITY): Payer: Self-pay

## 2023-09-18 ENCOUNTER — Inpatient Hospital Stay (HOSPITAL_COMMUNITY)
Admission: EM | Admit: 2023-09-18 | Discharge: 2023-09-29 | DRG: 871 | Disposition: A | Discharge status: Skilled Nursing Facility | Attending: Internal Medicine | Admitting: Internal Medicine

## 2023-09-18 DIAGNOSIS — E1111 Type 2 diabetes mellitus with ketoacidosis with coma: Secondary | ICD-10-CM | POA: Diagnosis not present

## 2023-09-18 DIAGNOSIS — Z8616 Personal history of COVID-19: Secondary | ICD-10-CM

## 2023-09-18 DIAGNOSIS — R1310 Dysphagia, unspecified: Secondary | ICD-10-CM | POA: Diagnosis present

## 2023-09-18 DIAGNOSIS — I959 Hypotension, unspecified: Secondary | ICD-10-CM | POA: Diagnosis present

## 2023-09-18 DIAGNOSIS — N1832 Chronic kidney disease, stage 3b: Secondary | ICD-10-CM | POA: Diagnosis present

## 2023-09-18 DIAGNOSIS — Z89511 Acquired absence of right leg below knee: Secondary | ICD-10-CM | POA: Diagnosis not present

## 2023-09-18 DIAGNOSIS — G9341 Metabolic encephalopathy: Secondary | ICD-10-CM | POA: Diagnosis present

## 2023-09-18 DIAGNOSIS — Z8546 Personal history of malignant neoplasm of prostate: Secondary | ICD-10-CM

## 2023-09-18 DIAGNOSIS — I130 Hypertensive heart and chronic kidney disease with heart failure and stage 1 through stage 4 chronic kidney disease, or unspecified chronic kidney disease: Secondary | ICD-10-CM | POA: Diagnosis present

## 2023-09-18 DIAGNOSIS — G9340 Encephalopathy, unspecified: Secondary | ICD-10-CM | POA: Diagnosis not present

## 2023-09-18 DIAGNOSIS — Z85038 Personal history of other malignant neoplasm of large intestine: Secondary | ICD-10-CM

## 2023-09-18 DIAGNOSIS — D509 Iron deficiency anemia, unspecified: Secondary | ICD-10-CM | POA: Diagnosis present

## 2023-09-18 DIAGNOSIS — D631 Anemia in chronic kidney disease: Secondary | ICD-10-CM | POA: Diagnosis present

## 2023-09-18 DIAGNOSIS — J9600 Acute respiratory failure, unspecified whether with hypoxia or hypercapnia: Secondary | ICD-10-CM | POA: Diagnosis not present

## 2023-09-18 DIAGNOSIS — A4153 Sepsis due to Serratia: Principal | ICD-10-CM | POA: Diagnosis present

## 2023-09-18 DIAGNOSIS — L89153 Pressure ulcer of sacral region, stage 3: Secondary | ICD-10-CM | POA: Diagnosis present

## 2023-09-18 DIAGNOSIS — I5020 Unspecified systolic (congestive) heart failure: Secondary | ICD-10-CM | POA: Diagnosis not present

## 2023-09-18 DIAGNOSIS — Z794 Long term (current) use of insulin: Secondary | ICD-10-CM

## 2023-09-18 DIAGNOSIS — E1110 Type 2 diabetes mellitus with ketoacidosis without coma: Secondary | ICD-10-CM | POA: Diagnosis present

## 2023-09-18 DIAGNOSIS — J1569 Pneumonia due to other gram-negative bacteria: Secondary | ICD-10-CM | POA: Diagnosis present

## 2023-09-18 DIAGNOSIS — S0003XA Contusion of scalp, initial encounter: Secondary | ICD-10-CM | POA: Diagnosis present

## 2023-09-18 DIAGNOSIS — E785 Hyperlipidemia, unspecified: Secondary | ICD-10-CM | POA: Diagnosis present

## 2023-09-18 DIAGNOSIS — R0600 Dyspnea, unspecified: Secondary | ICD-10-CM | POA: Diagnosis not present

## 2023-09-18 DIAGNOSIS — Z89512 Acquired absence of left leg below knee: Secondary | ICD-10-CM

## 2023-09-18 DIAGNOSIS — Z79899 Other long term (current) drug therapy: Secondary | ICD-10-CM

## 2023-09-18 DIAGNOSIS — E1122 Type 2 diabetes mellitus with diabetic chronic kidney disease: Secondary | ICD-10-CM | POA: Diagnosis present

## 2023-09-18 DIAGNOSIS — R6520 Severe sepsis without septic shock: Secondary | ICD-10-CM | POA: Diagnosis present

## 2023-09-18 DIAGNOSIS — Z602 Problems related to living alone: Secondary | ICD-10-CM | POA: Diagnosis present

## 2023-09-18 DIAGNOSIS — J690 Pneumonitis due to inhalation of food and vomit: Secondary | ICD-10-CM | POA: Diagnosis present

## 2023-09-18 DIAGNOSIS — W19XXXA Unspecified fall, initial encounter: Secondary | ICD-10-CM | POA: Diagnosis present

## 2023-09-18 DIAGNOSIS — R7989 Other specified abnormal findings of blood chemistry: Secondary | ICD-10-CM | POA: Diagnosis not present

## 2023-09-18 DIAGNOSIS — I4892 Unspecified atrial flutter: Secondary | ICD-10-CM | POA: Diagnosis present

## 2023-09-18 DIAGNOSIS — N400 Enlarged prostate without lower urinary tract symptoms: Secondary | ICD-10-CM | POA: Diagnosis present

## 2023-09-18 DIAGNOSIS — S0990XA Unspecified injury of head, initial encounter: Secondary | ICD-10-CM | POA: Diagnosis not present

## 2023-09-18 DIAGNOSIS — I10 Essential (primary) hypertension: Secondary | ICD-10-CM | POA: Diagnosis not present

## 2023-09-18 DIAGNOSIS — J9601 Acute respiratory failure with hypoxia: Secondary | ICD-10-CM | POA: Diagnosis present

## 2023-09-18 DIAGNOSIS — R569 Unspecified convulsions: Secondary | ICD-10-CM | POA: Diagnosis not present

## 2023-09-18 DIAGNOSIS — Z7901 Long term (current) use of anticoagulants: Secondary | ICD-10-CM

## 2023-09-18 DIAGNOSIS — R0603 Acute respiratory distress: Secondary | ICD-10-CM | POA: Diagnosis not present

## 2023-09-18 DIAGNOSIS — E8721 Acute metabolic acidosis: Secondary | ICD-10-CM | POA: Diagnosis not present

## 2023-09-18 DIAGNOSIS — Z87891 Personal history of nicotine dependence: Secondary | ICD-10-CM

## 2023-09-18 DIAGNOSIS — I482 Chronic atrial fibrillation, unspecified: Secondary | ICD-10-CM | POA: Diagnosis present

## 2023-09-18 DIAGNOSIS — C185 Malignant neoplasm of splenic flexure: Secondary | ICD-10-CM | POA: Diagnosis present

## 2023-09-18 DIAGNOSIS — R5381 Other malaise: Secondary | ICD-10-CM | POA: Diagnosis not present

## 2023-09-18 DIAGNOSIS — Z8673 Personal history of transient ischemic attack (TIA), and cerebral infarction without residual deficits: Secondary | ICD-10-CM

## 2023-09-18 DIAGNOSIS — N1831 Chronic kidney disease, stage 3a: Secondary | ICD-10-CM | POA: Diagnosis not present

## 2023-09-18 DIAGNOSIS — A419 Sepsis, unspecified organism: Secondary | ICD-10-CM | POA: Diagnosis not present

## 2023-09-18 DIAGNOSIS — I5181 Takotsubo syndrome: Secondary | ICD-10-CM | POA: Diagnosis not present

## 2023-09-18 DIAGNOSIS — I5021 Acute systolic (congestive) heart failure: Secondary | ICD-10-CM | POA: Diagnosis present

## 2023-09-18 DIAGNOSIS — E876 Hypokalemia: Secondary | ICD-10-CM | POA: Diagnosis not present

## 2023-09-18 DIAGNOSIS — E119 Type 2 diabetes mellitus without complications: Secondary | ICD-10-CM | POA: Diagnosis not present

## 2023-09-18 DIAGNOSIS — L8990 Pressure ulcer of unspecified site, unspecified stage: Secondary | ICD-10-CM | POA: Insufficient documentation

## 2023-09-18 DIAGNOSIS — R0689 Other abnormalities of breathing: Secondary | ICD-10-CM | POA: Diagnosis not present

## 2023-09-18 DIAGNOSIS — R404 Transient alteration of awareness: Secondary | ICD-10-CM | POA: Diagnosis present

## 2023-09-18 DIAGNOSIS — E8729 Other acidosis: Secondary | ICD-10-CM | POA: Diagnosis not present

## 2023-09-18 DIAGNOSIS — Z7984 Long term (current) use of oral hypoglycemic drugs: Secondary | ICD-10-CM

## 2023-09-18 DIAGNOSIS — N179 Acute kidney failure, unspecified: Secondary | ICD-10-CM | POA: Diagnosis present

## 2023-09-18 HISTORY — DX: Acquired absence of right leg below knee: Z89.511

## 2023-09-18 HISTORY — DX: Unspecified atrial flutter: I48.92

## 2023-09-18 HISTORY — DX: Malignant neoplasm of colon, unspecified: C18.9

## 2023-09-18 HISTORY — DX: Chronic kidney disease, stage 3 unspecified: N18.30

## 2023-09-18 HISTORY — DX: Hyperlipidemia, unspecified: E78.5

## 2023-09-18 HISTORY — DX: Type 2 diabetes mellitus without complications: E11.9

## 2023-09-18 HISTORY — DX: Essential (primary) hypertension: I10

## 2023-09-18 HISTORY — DX: Malignant neoplasm of prostate: C61

## 2023-09-18 HISTORY — DX: Benign prostatic hyperplasia without lower urinary tract symptoms: N40.0

## 2023-09-18 LAB — RESPIRATORY PANEL BY PCR

## 2023-09-18 LAB — CBC
HCT: 42.9 % (ref 39.0–52.0)
Hemoglobin: 13.9 g/dL (ref 13.0–17.0)
MCH: 25.1 pg — ABNORMAL LOW (ref 26.0–34.0)
MCHC: 32.4 g/dL (ref 30.0–36.0)
MCV: 77.6 fL — ABNORMAL LOW (ref 80.0–100.0)
Platelets: 166 10*3/uL (ref 150–400)
RBC: 5.53 MIL/uL (ref 4.22–5.81)
RDW: 14.4 % (ref 11.5–15.5)
WBC: 9.6 10*3/uL (ref 4.0–10.5)
nRBC: 0 % (ref 0.0–0.2)

## 2023-09-18 LAB — URINALYSIS, ROUTINE W REFLEX MICROSCOPIC
Bilirubin Urine: NEGATIVE
Glucose, UA: >=500 mg/dL — AB
Ketones, ur: 5 mg/dL — AB
Leukocytes,Ua: NEGATIVE
Nitrite: NEGATIVE
Protein, ur: >=300 mg/dL — AB
Specific Gravity, Urine: 1.021 (ref 1.005–1.030)
pH: 5 (ref 5.0–8.0)

## 2023-09-18 LAB — I-STAT ARTERIAL BLOOD GAS, ED
Acid-base deficit: 3 mmol/L — ABNORMAL HIGH (ref 0.0–2.0)
Bicarbonate: 18.7 mmol/L — ABNORMAL LOW (ref 20.0–28.0)
Calcium, Ion: 1.17 mmol/L (ref 1.15–1.40)
HCT: 34 % — ABNORMAL LOW (ref 39.0–52.0)
Hemoglobin: 11.6 g/dL — ABNORMAL LOW (ref 13.0–17.0)
O2 Saturation: 100 %
Patient temperature: 98
Potassium: 3.2 mmol/L — ABNORMAL LOW (ref 3.5–5.1)
Sodium: 136 mmol/L (ref 135–145)
TCO2: 19 mmol/L — ABNORMAL LOW (ref 22–32)
pCO2 arterial: 24.6 mmHg — ABNORMAL LOW (ref 32–48)
pH, Arterial: 7.488 — ABNORMAL HIGH (ref 7.35–7.45)
pO2, Arterial: 463 mmHg — ABNORMAL HIGH (ref 83–108)

## 2023-09-18 LAB — COMPREHENSIVE METABOLIC PANEL WITH GFR
ALT: 17 U/L (ref 0–44)
AST: 29 U/L (ref 15–41)
Albumin: 3.4 g/dL — ABNORMAL LOW (ref 3.5–5.0)
Alkaline Phosphatase: 82 U/L (ref 38–126)
Anion gap: 16 — ABNORMAL HIGH (ref 5–15)
BUN: 26 mg/dL — ABNORMAL HIGH (ref 8–23)
CO2: 23 mmol/L (ref 22–32)
Calcium: 9.5 mg/dL (ref 8.9–10.3)
Chloride: 99 mmol/L (ref 98–111)
Creatinine, Ser: 2.25 mg/dL — ABNORMAL HIGH (ref 0.61–1.24)
GFR, Estimated: 29 mL/min — ABNORMAL LOW (ref >60)
Glucose, Bld: 437 mg/dL — ABNORMAL HIGH (ref 70–99)
Potassium: 4 mmol/L (ref 3.5–5.1)
Sodium: 138 mmol/L (ref 135–145)
Total Bilirubin: 1.3 mg/dL — ABNORMAL HIGH (ref 0.0–1.2)
Total Protein: 7.9 g/dL (ref 6.5–8.1)

## 2023-09-18 LAB — BASIC METABOLIC PANEL WITH GFR
Anion gap: 16 — ABNORMAL HIGH (ref 5–15)
BUN: 29 mg/dL — ABNORMAL HIGH (ref 8–23)
CO2: 20 mmol/L — ABNORMAL LOW (ref 22–32)
Calcium: 9.1 mg/dL (ref 8.9–10.3)
Chloride: 104 mmol/L (ref 98–111)
Creatinine, Ser: 2.44 mg/dL — ABNORMAL HIGH (ref 0.61–1.24)
GFR, Estimated: 27 mL/min — ABNORMAL LOW (ref >60)
Glucose, Bld: 378 mg/dL — ABNORMAL HIGH (ref 70–99)
Potassium: 3.4 mmol/L — ABNORMAL LOW (ref 3.5–5.1)
Sodium: 140 mmol/L (ref 135–145)

## 2023-09-18 LAB — LACTIC ACID, PLASMA: Lactic Acid, Venous: 2.2 mmol/L (ref 0.5–1.9)

## 2023-09-18 LAB — CK TOTAL AND CKMB (NOT AT ARMC)
CK, MB: 17.9 ng/mL — ABNORMAL HIGH (ref 0.5–5.0)
Total CK: 621 U/L — ABNORMAL HIGH (ref 49–397)

## 2023-09-18 LAB — GLUCOSE, CAPILLARY
Glucose-Capillary: 204 mg/dL — ABNORMAL HIGH (ref 70–99)
Glucose-Capillary: 401 mg/dL — ABNORMAL HIGH (ref 70–99)
Glucose-Capillary: 405 mg/dL — ABNORMAL HIGH (ref 70–99)
Glucose-Capillary: 93 mg/dL (ref 70–99)

## 2023-09-18 LAB — I-STAT CHEM 8, ED
BUN: 31 mg/dL — ABNORMAL HIGH (ref 8–23)
Calcium, Ion: 1.13 mmol/L — ABNORMAL LOW (ref 1.15–1.40)
Chloride: 103 mmol/L (ref 98–111)
Creatinine, Ser: 2.1 mg/dL — ABNORMAL HIGH (ref 0.61–1.24)
Glucose, Bld: 435 mg/dL — ABNORMAL HIGH (ref 70–99)
HCT: 44 % (ref 39.0–52.0)
Hemoglobin: 15 g/dL (ref 13.0–17.0)
Potassium: 3.8 mmol/L (ref 3.5–5.1)
Sodium: 139 mmol/L (ref 135–145)
TCO2: 24 mmol/L (ref 22–32)

## 2023-09-18 LAB — PROTIME-INR
INR: 1.3 — ABNORMAL HIGH (ref 0.8–1.2)
Prothrombin Time: 16.2 s — ABNORMAL HIGH (ref 11.4–15.2)

## 2023-09-18 LAB — RAPID URINE DRUG SCREEN, HOSP PERFORMED
Amphetamines: NOT DETECTED
Barbiturates: NOT DETECTED
Benzodiazepines: NOT DETECTED
Cocaine: NOT DETECTED
Opiates: NOT DETECTED
Tetrahydrocannabinol: NOT DETECTED

## 2023-09-18 LAB — SAMPLE TO BLOOD BANK

## 2023-09-18 LAB — ETHANOL: Alcohol, Ethyl (B): <15 mg/dL (ref <15)

## 2023-09-18 LAB — AMMONIA: Ammonia: 21 umol/L (ref 9–35)

## 2023-09-18 LAB — MRSA NEXT GEN BY PCR, NASAL: MRSA by PCR Next Gen: NOT DETECTED

## 2023-09-18 LAB — I-STAT CG4 LACTIC ACID, ED: Lactic Acid, Venous: 2.2 mmol/L (ref 0.5–1.9)

## 2023-09-18 LAB — TROPONIN I (HIGH SENSITIVITY): Troponin I (High Sensitivity): 115 ng/L (ref <18)

## 2023-09-18 LAB — BETA-HYDROXYBUTYRIC ACID: Beta-Hydroxybutyric Acid: 4 mmol/L — ABNORMAL HIGH (ref 0.05–0.27)

## 2023-09-18 MED ORDER — LACTATED RINGERS IV BOLUS
1000.0000 mL | Freq: Once | INTRAVENOUS | Status: AC
Start: 2023-09-18 — End: 2023-09-18
  Administered 2023-09-18: 1000 mL via INTRAVENOUS

## 2023-09-18 MED ORDER — NOREPINEPHRINE 4 MG/250ML-% IV SOLN
0.0000 ug/min | INTRAVENOUS | Status: DC
  Administered 2023-09-18: 2 ug/min via INTRAVENOUS
  Filled 2023-09-18: qty 250

## 2023-09-18 MED ORDER — ORAL CARE MOUTH RINSE: 15.0000 mL | OROMUCOSAL | Status: DC | PRN

## 2023-09-18 MED ORDER — IOHEXOL 350 MG/ML SOLN
75.0000 mL | Freq: Once | INTRAVENOUS | Status: AC | PRN
  Administered 2023-09-18: 75 mL via INTRAVENOUS

## 2023-09-18 MED ORDER — FENTANYL BOLUS VIA INFUSION: 25.0000 ug | INTRAVENOUS | Status: DC | PRN

## 2023-09-18 MED ORDER — INSULIN GLARGINE-YFGN 100 UNIT/ML ~~LOC~~ SOLN
20.0000 [IU] | Freq: Every day | SUBCUTANEOUS | Status: DC
  Administered 2023-09-18: 20 [IU] via SUBCUTANEOUS
  Filled 2023-09-18 (×2): qty 0.2

## 2023-09-18 MED ORDER — MEDIHONEY WOUND/BURN DRESSING EX PSTE
1.0000 | PASTE | Freq: Every day | CUTANEOUS | Status: DC
  Filled 2023-09-18: qty 44

## 2023-09-18 MED ORDER — INSULIN ASPART 100 UNIT/ML IJ SOLN: 0.0000 [IU] | INTRAMUSCULAR | Status: DC

## 2023-09-18 MED ORDER — LACTATED RINGERS IV SOLN: INTRAVENOUS | Status: DC

## 2023-09-18 MED ORDER — SODIUM CHLORIDE 0.9 % BOLUS PEDS
1000.0000 mL | Freq: Once | INTRAVENOUS | Status: AC
  Administered 2023-09-18: 1000 mL via INTRAVENOUS

## 2023-09-18 MED ORDER — PROPOFOL 1000 MG/100ML IV EMUL
5.0000 ug/kg/min | INTRAVENOUS | Status: DC
  Administered 2023-09-18: 20 ug/kg/min via INTRAVENOUS
  Administered 2023-09-18: 45 ug/kg/min via INTRAVENOUS
  Administered 2023-09-18: 35 ug/kg/min via INTRAVENOUS
  Administered 2023-09-19: 20 ug/kg/min via INTRAVENOUS
  Administered 2023-09-20: 15 ug/kg/min via INTRAVENOUS
  Administered 2023-09-20: 20 ug/kg/min via INTRAVENOUS
  Filled 2023-09-18 (×5): qty 100

## 2023-09-18 MED ORDER — FENTANYL 2500MCG IN NS 250ML (10MCG/ML) PREMIX INFUSION
25.0000 ug/h | INTRAVENOUS | Status: DC
  Administered 2023-09-18: 150 ug/h via INTRAVENOUS
  Administered 2023-09-18: 250 ug/h via INTRAVENOUS
  Filled 2023-09-18 (×2): qty 250

## 2023-09-18 MED ORDER — DOCUSATE SODIUM 100 MG PO CAPS: 100.0000 mg | ORAL_CAPSULE | Freq: Two times a day (BID) | ORAL | Status: DC | PRN

## 2023-09-18 MED ORDER — POLYETHYLENE GLYCOL 3350 17 G PO PACK: 17.0000 g | PACK | Freq: Every day | ORAL | Status: DC | PRN

## 2023-09-18 MED ORDER — ETOMIDATE 2 MG/ML IV SOLN
INTRAVENOUS | Status: AC | PRN
  Administered 2023-09-18: 20 mg via INTRAVENOUS

## 2023-09-18 MED ORDER — INSULIN ASPART 100 UNIT/ML IJ SOLN
0.0000 [IU] | INTRAMUSCULAR | Status: DC
  Administered 2023-09-18: 20 [IU] via SUBCUTANEOUS
  Administered 2023-09-18: 7 [IU] via SUBCUTANEOUS

## 2023-09-18 MED ORDER — SUCCINYLCHOLINE CHLORIDE 20 MG/ML IJ SOLN
INTRAMUSCULAR | Status: AC | PRN
  Administered 2023-09-18: 100 mg via INTRAVENOUS

## 2023-09-18 MED ORDER — DOCUSATE SODIUM 50 MG/5ML PO LIQD: 100.0000 mg | Freq: Two times a day (BID) | ORAL | Status: DC | PRN

## 2023-09-18 MED ORDER — POTASSIUM CHLORIDE 20 MEQ PO PACK
40.0000 meq | PACK | Freq: Once | ORAL | Status: AC
  Administered 2023-09-18: 40 meq
  Filled 2023-09-18: qty 2

## 2023-09-18 MED ORDER — CHLORHEXIDINE GLUCONATE CLOTH 2 % EX PADS
6.0000 | MEDICATED_PAD | Freq: Every day | CUTANEOUS | Status: DC
  Administered 2023-09-18 – 2023-09-25 (×7): 6 via TOPICAL

## 2023-09-18 MED ORDER — LACTATED RINGERS IV BOLUS
1000.0000 mL | Freq: Once | INTRAVENOUS | Status: AC
  Administered 2023-09-18: 1000 mL via INTRAVENOUS

## 2023-09-18 MED ORDER — MEDIHONEY WOUND/BURN DRESSING EX PSTE
1.0000 | PASTE | Freq: Every day | CUTANEOUS | Status: DC
  Administered 2023-09-19 – 2023-09-29 (×11): 1 via TOPICAL
  Filled 2023-09-18 (×2): qty 44

## 2023-09-18 MED ORDER — ORAL CARE MOUTH RINSE
15.0000 mL | OROMUCOSAL | Status: DC
  Administered 2023-09-18 – 2023-09-21 (×32): 15 mL via OROMUCOSAL

## 2023-09-18 MED ORDER — PANTOPRAZOLE SODIUM 40 MG IV SOLR
40.0000 mg | INTRAVENOUS | Status: DC
  Administered 2023-09-18 – 2023-09-20 (×3): 40 mg via INTRAVENOUS
  Filled 2023-09-18 (×3): qty 10

## 2023-09-18 NOTE — Progress Notes (Signed)
   09/18/23 1032  Spiritual Encounters  Type of Visit Initial  Care provided to: Patient  Conversation partners present during encounter Nurse  Referral source Trauma page  Reason for visit Trauma   Chaplain responding to Trauma level 1 page. Pt suffered an unwitnessed fall and was found unconscious.  Pt still not conscious and unable to speak with chaplain.  Pt has not yet been identified so there is no family to reach out and connect with at this point.  Chaplain services remain available by Spiritual Consult or for emergent cases, paging [phone]  Chaplain Dewitte Foreman, MDiv [email or web address] [phone]

## 2023-09-18 NOTE — ED Notes (Signed)
 Trauma Response Nurse Documentation   Cory [name] is a 78 y.o. male arriving to [facility] ED via EMS  On Eliquis  (apixaban ) daily. Trauma was activated as a Level 1 by ED Charge RN based on the following trauma criteria GCS < 9.  Patient cleared for CT by Dr. Aniceto [name]. Pt transported to CT with trauma response nurse present to monitor. RN remained with the patient throughout their absence from the department for clinical observation.   GCS 5.  Trauma MD Arrival Time: 1000 Dr Cory [name] and Cory Simmonds, PA  History   History reviewed. No pertinent past medical history.   History reviewed. No pertinent surgical history.   Initial Focused Assessment (If applicable, [name] please see trauma documentation): Airway: GCS 5, compromised, not maintaining protective airway.  Breathing: Breath sounds clear, equal bilaterally. Pt on [place] with EMS.  Circulation: Central and peripheral pulses intact throughout.  Severe hypertension present. (SBP 220's palp). Large hematoma noted to L forehead.  No active external bleeding present.  20G PIV to L AC - unable to flush.  Disability: Pt obtunded; withdraws to pain. Nonverbal.  Unequal pupils.  R pupil: 5mm sluggish. L pupil: 2mm sluggish. C-collar had not been applied by EMS. Pt had vomited and defecated on himself on scene.  VS by exception: Hypertension and tachycardia present.   CT's Completed:   CT Head, CT Maxillofacial, CT C-Spine, CT Chest w/ contrast, and CT abdomen/pelvis w/ contrast   Interventions:  [place] C-collar placed upon arrival IO inserted into L tibia and flushed RSI meds given in tibia Pt intubated with 8.0 ETT OG tube placed CXR Pelvic XR CT pan scan Pt logrolled - no stepoffs [name] deformities noted Pt cleaned up due to vomit and stool. Propofol  gtt initiated  Fentanyl  gtt initiated  LR infusion initiated   Plan for disposition:  Admission to ICU per CCM - trauma to sign off due to being cleared from trauma perspective.    Consults completed:  none at 1300 - CCM consulted for admission.  Event Summary: Pt was BIB [facility] after he was found down at home by his home health aide.  Pt presumed to have an unwitnessed fall due to unresponsiveness and hematoma to forehead.  Pt's GCS remained 5 throughout transport and while in trauma bay.  Pt was immediately intubated upon arrival to trauma B.  Pt had vomit in his beard and stool underneath him.    Bedside handoff with ED RN Cory [name].    Cory [name]  Trauma Response RN  Please call TRN at [phone] for further assistance.

## 2023-09-18 NOTE — ED Provider Notes (Signed)
 [place] EMERGENCY DEPARTMENT AT [facility] Provider Note   CSN: [id number] Arrival date & time: 09/18/23  1012     History  Chief Complaint  Patient presents with   Cory [name] is a 78 y.o. male.  Patient presenting as a level 1 trauma.  He was found by his caregiver this morning when they entered the house.  He was on the floor.  Unresponsive.  Last known well was last night.  Has a hematoma to his left forehead, unequal pupils, spontaneously breathing, not following commands.  Hypertensive.  The history is provided by the EMS personnel.  Fall This is a new problem.       Home Medications Prior to Admission medications   Not on File      Allergies    Patient has no allergy information on record.    Review of Systems   Review of Systems  Unable to perform ROS: Mental status change    Physical Exam Updated Vital Signs BP (!) 221/134   Pulse (!) 119   Temp 98 F (36.7 C) (Temporal)   Resp (!) 25   Ht 5\' 11"  (1.803 m)   SpO2 100%  Physical Exam Vitals and nursing note reviewed.  Constitutional:      General: He is not in acute distress.    Appearance: Normal appearance. He is well-developed.  HENT:     Head: Normocephalic.     Comments: Hematoma left forehead Eyes:     Conjunctiva/sclera: Conjunctivae normal.     Comments: 5 mm right 2 mm left not reactive  Cardiovascular:     Rate and Rhythm: Regular rhythm. Tachycardia present.     Heart sounds: No murmur heard. Pulmonary:     Effort: Pulmonary effort is normal. No respiratory distress.     Breath sounds: Normal breath sounds.  Abdominal:     Palpations: Abdomen is soft.     Tenderness: There is no abdominal tenderness.  Musculoskeletal:     Cervical back: Neck supple.  Skin:    General: Skin is warm and dry.     Capillary Refill: Capillary refill takes less than 2 seconds.  Neurological:     Comments: Not answering questions not following commands.  He has a little bit of  decerebrate posturing.     ED Results / Procedures / Treatments   Labs (all labs ordered are listed, but only abnormal results are displayed) Labs Reviewed  COMPREHENSIVE METABOLIC PANEL WITH GFR - Abnormal; Notable for the following components:      Result Value   Glucose, Bld 437 (*)    BUN 26 (*)    Creatinine, Ser 2.25 (*)    Albumin 3.4 (*)    Total Bilirubin 1.3 (*)    GFR, Estimated 29 (*)    Anion gap 16 (*)    All other components within normal limits  CBC - Abnormal; Notable for the following components:   MCV 77.6 (*)    MCH 25.1 (*)    All other components within normal limits  URINALYSIS, ROUTINE W REFLEX MICROSCOPIC - Abnormal; Notable for the following components:   APPearance CLOUDY (*)    Glucose, UA >=500 (*)    Hgb urine dipstick MODERATE (*)    Ketones, ur 5 (*)    Protein, ur >=300 (*)    Bacteria, UA MANY (*)    All other components within normal limits  PROTIME-INR - Abnormal; Notable for the following components:  Prothrombin Time 16.2 (*)    INR 1.3 (*)    All other components within normal limits  CK TOTAL AND CKMB (NOT AT [facility]) - Abnormal; Notable for the following components:   Total CK 621 (*)    CK, MB 17.9 (*)    All other components within normal limits  GLUCOSE, CAPILLARY - Abnormal; Notable for the following components:   Glucose-Capillary 405 (*)    All other components within normal limits  BETA-HYDROXYBUTYRIC ACID - Abnormal; Notable for the following components:   Beta-Hydroxybutyric Acid 4.00 (*)    All other components within normal limits  LACTIC ACID, PLASMA - Abnormal; Notable for the following components:   Lactic Acid, Venous 2.2 (*)    All other components within normal limits  BASIC METABOLIC PANEL WITH GFR - Abnormal; Notable for the following components:   Potassium 3.4 (*)    CO2 20 (*)    Glucose, Bld 378 (*)    BUN 29 (*)    Creatinine, Ser 2.44 (*)    GFR, Estimated 27 (*)    Anion gap 16 (*)    All other  components within normal limits  GLUCOSE, CAPILLARY - Abnormal; Notable for the following components:   Glucose-Capillary 401 (*)    All other components within normal limits  I-STAT CHEM 8, ED - Abnormal; Notable for the following components:   BUN 31 (*)    Creatinine, Ser 2.10 (*)    Glucose, Bld 435 (*)    Calcium, Ion 1.13 (*)    All other components within normal limits  I-STAT CG4 LACTIC ACID, ED - Abnormal; Notable for the following components:   Lactic Acid, Venous 2.2 (*)    All other components within normal limits  I-STAT ARTERIAL BLOOD GAS, ED - Abnormal; Notable for the following components:   pH, Arterial 7.488 (*)    pCO2 arterial 24.6 (*)    pO2, Arterial 463 (*)    Bicarbonate 18.7 (*)    TCO2 19 (*)    Acid-base deficit 3.0 (*)    Potassium 3.2 (*)    HCT 34.0 (*)    Hemoglobin 11.6 (*)    All other components within normal limits  MRSA NEXT GEN BY PCR, NASAL  RESPIRATORY PANEL BY PCR  ETHANOL  RAPID URINE DRUG SCREEN, HOSP PERFORMED  BLOOD GAS, ARTERIAL  HEMOGLOBIN A1C  LACTIC ACID, PLASMA  AMMONIA  LACTIC ACID, PLASMA  SAMPLE TO BLOOD BANK  TROPONIN I (HIGH SENSITIVITY)  TROPONIN I (HIGH SENSITIVITY)    EKG None  Radiology DG Chest Portable 1 View Result Date: 09/18/2023 CLINICAL DATA:  Post intubation EXAM: PORTABLE CHEST 1 VIEW COMPARISON:  Sep 18, 2023 FINDINGS: The heart size and mediastinal contours are within normal limits. Both lungs are clear. The visualized skeletal structures are unremarkable. Comparison with prior examination there is no change in the position of the endotracheal tube the tip of which remains 2 cm from carina. The nasogastric tube remains in anatomic position the tip in the stomach. IMPRESSION: *No acute cardiopulmonary process. *Endotracheal tube and nasogastric tube in anatomic position. Electronically Signed   By: Fredrich Jefferson M.D.   On: 09/18/2023 11:47   CT CHEST ABDOMEN PELVIS W CONTRAST Addendum Date:  09/18/2023 ADDENDUM REPORT: 09/18/2023 11:44 ADDENDUM: Critical Value/emergent results were called by telephone at the time of interpretation on 09/18/2023 at 11:30 am to Dr. Aniceto Barley, who verbally acknowledged these results. Electronically Signed   By: Beula Brunswick M.D.   On:  09/18/2023 11:44   Result Date: 09/18/2023 CLINICAL DATA:  Polytrauma, blunt. Unwitnessed fall. History of sigmoid colon cancer. * Tracking Code: BO * EXAM: CT CHEST, ABDOMEN, AND PELVIS WITH CONTRAST TECHNIQUE: Multidetector CT imaging of the chest, abdomen and pelvis was performed following the standard protocol during bolus administration of intravenous contrast. RADIATION DOSE REDUCTION: This exam was performed according to the departmental dose-optimization program which includes automated exposure control, adjustment of the mA and/or kV according to patient size and/or use of iterative reconstruction technique. CONTRAST:  75mL OMNIPAQUE  IOHEXOL  350 MG/ML SOLN COMPARISON:  PET-CT scan from 04/19/2023. FINDINGS: CT CHEST FINDINGS Cardiovascular: Normal cardiac size. No pericardial effusion. No aortic aneurysm. Mediastinum/Nodes: Visualized thyroid gland appears grossly unremarkable. No solid / cystic mediastinal masses. The esophagus is nondistended precluding optimal assessment. An enteric tube noted with its tip in the cardia/fundus of the stomach. No axillary, mediastinal or hilar lymphadenopathy by size criteria. Lungs/Pleura: The central tracheo-bronchial tree is patent. There are dependent changes in bilateral lungs. No mass or consolidation. No pleural effusion or pneumothorax. No suspicious lung nodules. Musculoskeletal: The visualized soft tissues of the chest wall are grossly unremarkable. No suspicious osseous lesions. There are mild to moderate multilevel degenerative changes in the visualized spine. CT ABDOMEN PELVIS FINDINGS Hepatobiliary: The liver is normal in size. Non-cirrhotic configuration. No suspicious mass. These is  mild diffuse hepatic steatosis. No intrahepatic or extrahepatic bile duct dilation. No calcified gallstones. Normal gallbladder wall thickness. No pericholecystic inflammatory changes. Pancreas: Unremarkable. No pancreatic ductal dilatation or surrounding inflammatory changes. Spleen: Within normal limits. No focal lesion. Adrenals/Urinary Tract: Adrenal glands are unremarkable. Persistent fetal lobulations noted in bilateral kidneys. No suspicious mass. No hydroureteronephrosis or nephroureterolithiasis. Unremarkable urinary bladder. Stomach/Bowel: No disproportionate dilation of the small or large bowel loops. The appendix was not visualized; however there is no acute inflammatory process in the right lower quadrant. 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 Vascular/Lymphatic: No ascites or pneumoperitoneum. No abdominal or pelvic lymphadenopathy, by size criteria. No aneurysmal dilation of the major abdominal arteries. There are mild peripheral atherosclerotic vascular calcifications of the aorta and its major branches. Reproductive: Normal size prostate. Symmetric seminal vesicles. Other: There are small fat containing umbilical and left inguinal hernias. There are hypoattenuating areas in the linear configuration in the left iliacus/iliopsoas muscles, nonspecific but can be seen with bursitis. The fluid collection does not exhibit hyperattenuating walls to suggest abscess. Correlate clinically to determine the need for additional imaging. The soft tissues and abdominal wall are otherwise unremarkable. Musculoskeletal: No suspicious osseous lesions. There are mild - moderate multilevel degenerative changes in the visualized spine. IMPRESSION: 1. No acute traumatic injury to the chest, abdomen or pelvis. 2.  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 3. Hypoattenuating areas in the left iliacus/iliopsoas muscle, as described above. 4. Multiple other nonacute observations, as described above. Electronically Signed: By: Beula Brunswick M.D. On: 09/18/2023 11:15   CT HEAD WO CONTRAST Result Date: 09/18/2023 CLINICAL DATA:  Provided history: Head trauma, moderate/severe. Polytrauma, blunt. EXAM: CT HEAD WITHOUT CONTRAST CT CERVICAL SPINE WITHOUT CONTRAST TECHNIQUE: Multidetector CT imaging of the head and cervical spine was performed following the standard protocol without intravenous contrast. Multiplanar CT image reconstructions of the cervical spine were also generated. RADIATION DOSE REDUCTION:  This exam was performed according to the departmental dose-optimization program which includes automated exposure control, adjustment of the mA and/or kV according to patient size and/or use of iterative reconstruction technique. COMPARISON:  Brain MRI 05/09/2023. Head CT 05/09/2023. Cervical spine CT 05/09/2023. FINDINGS: CT HEAD FINDINGS Brain: Generalized cerebral atrophy. Small chronic cortical/subcortical infarcts again noted within the right frontal lobe. Background mild patchy and ill-defined hypoattenuation within the cerebral white matter, nonspecific but compatible with chronic small vessel ischemic diseaase. Small chronic infarcts within the thalami. There is no acute intracranial hemorrhage. No acute demarcated cortical infarct. No extra-axial fluid collection. No evidence of an intracranial mass. No midline shift. Vascular: No hyperdense vessel.  Atherosclerotic calcifications. Skull: No calvarial fracture or aggressive osseous lesion. Sinuses/Orbits: No mass or acute finding within the imaged orbits. No significant paranasal sinus disease. Other: Left frontoparietal and temporal scalp  hematoma. CT CERVICAL SPINE FINDINGS Alignment: Nonspecific straightening of the expected cervical lordosis. Slight C7-T1 grade 1 anterolisthesis. Skull base and vertebrae: The basion-dental and atlanto-dental intervals are maintained.No evidence of acute fracture to the cervical spine. Soft tissues and spinal canal: No prevertebral fluid or swelling. No visible canal hematoma. Disc levels: Cervical spondylosis with multilevel disc space narrowing, disc bulges/central disc protrusions, endplate osteophytes, uncovertebral hypertrophy and facet arthropathy. Bridging ventral osteophytes at C2-C3, C3-C4, C4-C5, C5-C6 and C7-T1. Osseous fusion present elsewhere across the disc spaces at C3-C4, C4-C5 and C5-C6. No appreciable high-grade spinal canal stenosis. Mild bony neural foraminal narrowing on the right at C6-C7 due to uncovertebral hypertrophy. Upper chest: No consolidation within the imaged lung apices. No visible pneumothorax. Other: Partially imaged support tubes. No evidence of an acute intracranial abnormality. No acute cervical spine fracture. These results were called by telephone at the time of interpretation on 09/18/2023 at 11:22 am to provider PA Cherlyn Cornet, who verbally acknowledged these results. IMPRESSION: 1. No evidence of an acute intracranial abnormality. 2. Left-sided scalp hematoma. 3. 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 CT cervical spine: 1. No evidence of an acute cervical spine fracture. 2. Nonspecific straightening of the expected cervical lordosis. 3. Mild C7-T1 grade 1 anterolisthesis. 4. Cervical spondylosis and multilevel vertebral ankylosis, as described. Electronically Signed   By: Bascom Lily D.O.   On: 09/18/2023 11:23   CT CERVICAL SPINE WO CONTRAST Result Date: 09/18/2023 CLINICAL DATA:  Provided history: Head trauma, moderate/severe. Polytrauma, blunt. EXAM: CT HEAD WITHOUT CONTRAST CT CERVICAL SPINE WITHOUT CONTRAST  TECHNIQUE: Multidetector CT imaging of the head and cervical spine was performed following the standard protocol without intravenous contrast. Multiplanar CT image reconstructions of the cervical spine were also generated. RADIATION DOSE REDUCTION: This exam was performed according to the departmental dose-optimization program which includes automated exposure control, adjustment of the mA and/or kV according to patient size and/or use of iterative reconstruction technique. COMPARISON:  Brain MRI 05/09/2023. Head CT 05/09/2023. Cervical spine CT 05/09/2023. FINDINGS: CT HEAD FINDINGS Brain: Generalized cerebral atrophy. Small chronic cortical/subcortical infarcts again noted within the right frontal lobe. Background mild patchy and ill-defined hypoattenuation within the cerebral white matter, nonspecific but compatible with chronic small vessel ischemic diseaase. Small chronic infarcts within the thalami. There is no acute intracranial hemorrhage. No acute demarcated cortical infarct. No extra-axial fluid collection. No evidence of an intracranial mass. No midline shift. Vascular: No hyperdense vessel.  Atherosclerotic calcifications. Skull: No calvarial fracture or aggressive osseous lesion. Sinuses/Orbits: No mass or acute finding within the imaged orbits. No significant paranasal sinus disease. Other:  Left frontoparietal and temporal scalp hematoma. CT CERVICAL SPINE FINDINGS Alignment: Nonspecific straightening of the expected cervical lordosis. Slight C7-T1 grade 1 anterolisthesis. Skull base and vertebrae: The basion-dental and atlanto-dental intervals are maintained.No evidence of acute fracture to the cervical spine. Soft tissues and spinal canal: No prevertebral fluid or swelling. No visible canal hematoma. Disc levels: Cervical spondylosis with multilevel disc space narrowing, disc bulges/central disc protrusions, endplate osteophytes, uncovertebral hypertrophy and facet arthropathy. Bridging ventral  osteophytes at C2-C3, C3-C4, C4-C5, C5-C6 and C7-T1. Osseous fusion present elsewhere across the disc spaces at C3-C4, C4-C5 and C5-C6. No appreciable high-grade spinal canal stenosis. Mild bony neural foraminal narrowing on the right at C6-C7 due to uncovertebral hypertrophy. Upper chest: No consolidation within the imaged lung apices. No visible pneumothorax. Other: Partially imaged support tubes. No evidence of an acute intracranial abnormality. No acute cervical spine fracture. These results were called by telephone at the time of interpretation on 09/18/2023 at 11:22 am to provider PA Cherlyn Cornet, who verbally acknowledged these results. IMPRESSION: 1. No evidence of an acute intracranial abnormality. 2. Left-sided scalp hematoma. 3. 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 CT cervical spine: 1. No evidence of an acute cervical spine fracture. 2. Nonspecific straightening of the expected cervical lordosis. 3. Mild C7-T1 grade 1 anterolisthesis. 4. Cervical spondylosis and multilevel vertebral ankylosis, as described. Electronically Signed   By: Bascom Lily D.O.   On: 09/18/2023 11:23   DG Pelvis Portable Result Date: 09/18/2023 CLINICAL DATA:  Trauma. EXAM: PORTABLE PELVIS 1-2 VIEWS COMPARISON:  None Available. FINDINGS: Pelvis is intact with normal and symmetric sacroiliac joints. No acute fracture or dislocation. No aggressive osseous lesion. Visualized sacral arcuate lines are unremarkable. Unremarkable symphysis pubis. There are mild degenerative changes of bilateral hip joints without significant joint space narrowing. Osteophytosis of the superior acetabulum. No radiopaque foreign bodies. IMPRESSION: *No acute osseous abnormality of the pelvis. Electronically Signed   By: Beula Brunswick M.D.   On: 09/18/2023 11:17   DG Chest Port 1 View Result Date: 09/18/2023 CLINICAL DATA:  Trauma. EXAM: PORTABLE CHEST 1 VIEW COMPARISON:  05/11/2023. FINDINGS:  Low lung volume. Bilateral lung fields are clear. Bilateral costophrenic angles are clear. Normal cardio-mediastinal silhouette. No acute osseous abnormalities. The soft tissues are within normal limits. Endotracheal tube is seen with its tip approximately 2-2.5 cm above the carina. Enteric tube is seen coursing below the left hemidiaphragm, curling within the proximal stomach with its tip and side hole overlying the fundal region of the stomach. IMPRESSION: No active disease. Support apparatus, as described above. Electronically Signed   By: Beula Brunswick M.D.   On: 09/18/2023 11:00    Procedures .Critical Care  Performed by: Tonya Fredrickson, MD Authorized by: Tonya Fredrickson, MD   Critical care provider statement:    Critical care time (minutes):  45   Critical care time was exclusive of:  Separately billable procedures and treating other patients   Critical care was time spent personally by me on the following activities:  Development of treatment plan with patient or surrogate, discussions with consultants, evaluation of patient's response to treatment, examination of patient, obtaining history from patient or surrogate, ordering and performing treatments and interventions, ordering and review of laboratory studies, ordering and review of radiographic studies, pulse oximetry and re-evaluation of patient's condition   I assumed direction of critical care for this patient from another provider in my specialty: no   Procedure Name: Intubation Date/Time: 09/18/2023 10:38  AM  Performed by: Tonya Fredrickson, MDPre-anesthesia Checklist: Patient identified, Patient being monitored, Emergency Drugs available, Timeout performed and Suction available Oxygen Delivery Method: Non-rebreather mask Preoxygenation: Pre-oxygenation with 100% oxygen Induction Type: Rapid sequence Ventilation: Mask ventilation without difficulty Laryngoscope Size: Glidescope and 4 Grade View: Grade II Number of attempts:  1 Placement Confirmation: ETT inserted through vocal cords under direct vision, CO2 detector and Breath sounds checked- equal and bilateral Secured at: 26 cm Tube secured with: ETT holder Dental Injury: Teeth and Oropharynx as per pre-operative assessment         Medications Ordered in ED Medications  etomidate  (AMIDATE ) injection (20 mg Intravenous Given 09/18/23 1020)  succinylcholine  (ANECTINE ) injection (100 mg Intravenous Given 09/18/23 1021)  fentaNYL  in NS (53mcg/ml) infusion-PREMIX (has no administration in time range)  fentaNYL  (SUBLIMAZE ) bolus via infusion 25-100 mcg (has no administration in time range)    ED Course/ Medical Decision Making/ A&P Clinical Course as of 09/18/23 1641  Mon Sep 18, 2023  1110 Patient was having a cuff leak so I did a tube exchange over a bougie.  Will need a repeat chest x-ray. [MB]  1133 Trauma PA Cherlyn Cornet reviewed CTs with radiology.  No significant traumatic findings identified.  They are recommending critical care consult for admission. [MB]  1159 Discussed with critical care Dr. Magda Schneider and they will have somebody from the team evaluate patient for admission. [MB]    Clinical Course User Index [MB] Tonya Fredrickson, MD                                 Medical Decision Making Amount and/or Complexity of Data Reviewed Labs: ordered. Radiology: ordered.  Risk Prescription drug management. Decision regarding hospitalization.   This patient complains of unresponsive, head injury; this involves an extensive number of treatment Options and is a complaint that carries with it a high risk of complications and morbidity. The differential includes fall, contusion, bleed, stroke, intoxication, hypoglycemia, sepsis  I ordered, reviewed and interpreted labs, which included CBC unremarkable, chemistries with elevated glucose chronic CKD, total CK mildly elevated, urinalysis possible signs of infection, ABG without acidosis I  ordered medication medications for intubation and sedation and reviewed PMP when indicated. I ordered imaging studies which included chest and pelvis x-ray, CT head cervical spine chest abdomen and pelvis and I independently    visualized and interpreted imaging which showed soft tissue swelling no otherwise acute traumatic findings Additional history obtained from EMS Previous records obtained and reviewed in epic, is on blood thinners I consulted trauma Dr. Thelbert Finner, critical care Dr. Magda Schneider and discussed lab and imaging findings and discussed disposition.  Cardiac monitoring reviewed, A-fib with RVR improving to A-fib with controlled rate Social determinants considered, unknown barriers Critical Interventions: Level 1 trauma requiring bedside presence, management of airway, ordering and interpreting of imaging and discussion with consultants  After the interventions stated above, I reevaluated the patient and found patient still to be intubated and sedated.  Not following commands Admission and further testing considered, will need admission to the hospital for further management.         Final Clinical Impression(s) / ED Diagnoses Final diagnoses:  Acute encephalopathy  Traumatic injury of head, initial encounter    Rx / DC Orders ED Discharge Orders     None         Tonya Fredrickson, MD 09/18/23 [phone]

## 2023-09-18 NOTE — Progress Notes (Signed)
 ETT exchanged via bougie due concerns of blown cuff. Pt has BBS, no cuff leak now, and vitals stable. RT will continue to monitor.

## 2023-09-18 NOTE — ED Notes (Signed)
 Propofol  started in tibia at 30mcgs

## 2023-09-18 NOTE — Plan of Care (Signed)

## 2023-09-18 NOTE — ED Triage Notes (Signed)
 Pt had unwitnessed fall. AId found pt on floor at 0900 covered in feces and vomit. Inequal pupils. Right-5 and left pupil -2. Hematoma to right forehead. GCS-5

## 2023-09-18 NOTE — Consult Note (Signed)
 NAME:  Cory [name], MRN:  [id number], DOB:  10-Aug-1945, LOS: 0 ADMISSION DATE:  09/18/2023, CONSULTATION DATE:  09/18/2023 REFERRING MD:  Dr. Randal Bury, CHIEF COMPLAINT:  AMS   History of Present Illness:  HPI obtained from EMR as pt is sedated and intubated on MV.  See additional chart MRN [id number].   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  GCS 5 on arrival with pill rolling motions noted in BUEs.  Intubated for airway protection.  Workup with CT head, chest, abd/pelvis negative for traumatic injuries.  Labs noted for glucose 437, BUN/ sCr 26/ 2.25, t. Bili 1.3, albumin 3.4, CK 621, lactic 2.2, INR 1.3, H/H 13.9/ 42.  Currently sedated on propofol  and fentanyl  due to biting of ett.  PCCM consulted for medical admit.    Of note, previous admit in 04/2023 with COVID, rhabdomyolysis, DKA, and seizures 2/2 hyperglycemia.  Spoke with niece/ HPOA [name] by phone who states pt was in his normal state of health on Saturday, after taking him to his scheduled MD appointment, only recent complaint of sneezing.  Otherwise pt lives independently and has a caregiver that checks on him throughout the week.   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 rate 16, height 5\' 11"  (1.803 m), weight 84.5 kg, SpO2 100%.    Vent Mode: PRVC FiO2 (%):  [40 %-100 %] 40 % Set Rate:  [16 bmp-20 bmp] 16 bmp Vt Set:   [600 mL] 600 mL PEEP:  [5 cmH20] 5 cmH20 Plateau Pressure:  [19 cmH20] 19 cmH20   Intake/Output Summary (Last 24 hours) at 09/18/2023 1340 Last data filed at 09/18/2023 1103 Gross per 24 hour  Intake --  Output 400 ml  Net -400 ml   Filed Weights   09/18/23 1028  Weight: 84.5 kg    Examination: Fent 200, fent  45 General:  critically ill elderly male sedated on MV in NAD HEENT: MM pink/moist, poor dentition, R pupil 4/NR, L pupil 2/r, ETT/ OGT, dried emesis in beard, c-collar in place, left scalp hematoma Neuro:  grimaces at times to noxious stimuli but otherwise sedated CV: aflutter rate 70, R femoral CVL PULM:  MV supported, coarse  GI: soft, +bs, ND, foley Extremities: warm/dry, IO to left tibia, R BKA  Skin: no rashes    Resolved Hospital Problem list    Assessment & Plan:   Acute encephalopathy, suspected metabolic  Unwitnessed fall with left scalp hematoma on eliquis  - previous hx of seizures in 04/2023 felt 2/2 to DKA P:  - CTH neg - pending UDS, check ammonia - check EEG given previous seizures with hyperglycemia - ativan prn  - seizure precautions - supportive/ hyperglycemia care as below - neuro watch - consider MRI brain  - hold on empiric abx for now given normal WBC, afebrile.  Cont to monitor clinically.  CNS process remains in ddx, unable to assess for  nuchal rigidity in ccollar   Acute respiratory insufficiency related to above P:  - cont full MV support, 4-8cc/kg IBW with goal Pplat <30 and DP<15  - VAP prevention protocol/ PPI - PAD protocol for sedation> minimize propofol  and fentanyl , RASS goal 0/-1 - ABG reviewed> decrease rate to 16 - intermittent CXR/ ABG - wean FiO2 as able for SpO2 >92% - daily SAT & SBT when appropriate  - prn bronchodilators  - high risk for aspiration but no evidence on CT   CKD3a Elevated CK AGMA/ lactic acidosis  - sCr at baseline from December  - LR bolus and repeat lactic  - repeat CK in am  - cont foley  for now - trend renal indices  - strict I/Os, daily wts - avoid nephrotoxins, renal dose meds, hemodynamic support as above   Uncontrolled DMT2, r/o HHNK - AG 16, initial glucose 437, uKetone 5, urine glucose > 500, ph 7.48, not really consistent with DKA but will check BHA - s/p SQ insulin  per prn SSI  and getting IVF> may need to transition to insulin  gtt/endotool if not improving - check A1c   Chronic Atrial flutter on Eliquis  - tele monitor  - hold pta eliquis   - optimize electrolytes   HTN HLD - pending chart review> to clarify home meds - normotensive currently  - no STE on EKG, trend trop hs   Colon Cancer of splenic flexure - followed by Dr. [name], previous surgery in January put off given December hospitalization with plans to reschedule soon, now likely to be postponed again.    Best Practice (right click and "Reselect all SmartList Selections" daily)   Diet/type: NPO DVT prophylaxis SCD Pressure ulcer(s): N/A GI prophylaxis: PPI Lines: Central line Foley:  Yes, and it is still needed Code Status:  full code Last date of multidisciplinary goals of care discussion [5/12]  NOK- son, [name] no answer.  Spoke with niece, [name] who states she is his HPOA and usually takes him to his appointment.  Reports son lives in [place]  and not involved.  States pt was expressed to remain full code on their last discussion.   Labs   CBC: Recent Labs  Lab 09/18/23 1037 09/18/23 1038 09/18/23 1206  WBC  --  9.6  --   HGB 15.0 13.9 11.6*  HCT 44.0 42.9 34.0*  MCV  --  77.6*  --   PLT  --  166  --     Basic Metabolic Panel: Recent Labs  Lab 09/18/23 1037 09/18/23 1038 09/18/23 1206  NA 139 138 136  K 3.8 4.0 3.2*  CL 103 99  --   CO2  --  23  --   GLUCOSE 435* 437*  --   BUN 31* 26*  --   CREATININE 2.10* 2.25*  --   CALCIUM  --  9.5  --    GFR: Estimated Creatinine Clearance: 29.7 mL/min (A) (by C-G formula based on SCr of 2.25 mg/dL (H)). Recent  Labs  Lab 09/18/23 1036 09/18/23 1038  WBC  --  9.6  LATICACIDVEN 2.2*  --     Liver Function Tests: Recent Labs  Lab 09/18/23 1038  AST 29  ALT 17  ALKPHOS 82  BILITOT 1.3*  PROT 7.9  ALBUMIN 3.4*   No results for input(s): "LIPASE", "AMYLASE" in the last 168 hours. No results for input(s): "AMMONIA" in the last 168 hours.  ABG    Component Value Date/Time   PHART 7.488 (H) 09/18/2023 1206  PCO2ART 24.6 (L) 09/18/2023 1206   PO2ART 463 (H) 09/18/2023 1206   HCO3 18.7 (L) 09/18/2023 1206   TCO2 19 (L) 09/18/2023 1206   ACIDBASEDEF 3.0 (H) 09/18/2023 1206   O2SAT 100 09/18/2023 1206     Coagulation Profile: Recent Labs  Lab 09/18/23 1038  INR 1.3*    Cardiac Enzymes: Recent Labs  Lab 09/18/23 1038  CKTOTAL 621*  CKMB 17.9*    HbA1C: No results found for: "HGBA1C"  CBG: No results for input(s): "GLUCAP" in the last 168 hours.  Review of Systems:   Unable   Past Medical History:  He,  has no past medical history on file.   Surgical History:  History reviewed. No pertinent surgical history.   Social History:      Family History:  His family history is not on file.   Allergies No Known Allergies   Home Medications  Prior to Admission medications   Medication Sig Start Date End Date Taking? Authorizing Provider  atorvastatin (LIPITOR) 20 MG tablet Take 20 mg by mouth daily. 06/27/23   [provider]  carvedilol  (COREG ) 25 MG tablet Take 25 mg by mouth 2 (two) times daily. 07/24/23   [provider]  Continuous Glucose Sensor (DEXCOM G6 SENSOR) MISC Inject 1 Device into the skin See admin instructions. Change device every 10 days. 09/17/23   [provider]  folic acid (FOLVITE) 1 MG tablet Take 1 mg by mouth daily.    [provider]  hydrALAZINE  (APRESOLINE ) 25 MG tablet Take 25 mg by mouth 3 (three) times daily. 06/27/23   [provider]  INVOKANA 300 MG TABS tablet Take 300 mg by mouth daily.  09/18/23   [provider]  KERENDIA 10 MG TABS Take 1 tablet by mouth daily. 09/18/23   [provider]  LANTUS  SOLOSTAR 100 UNIT/ML Solostar Pen Inject 45 Units into the skin at bedtime. 06/19/23   [provider]  metolazone (ZAROXOLYN) 2.5 MG tablet Take 2.5 mg by mouth every other day. 04/03/23   [provider]  NOVOLOG  100 UNIT/ML injection Inject 0-9 Units into the skin 3 (three) times daily with meals. Inject 0-9 Units into the skin 3 (three) times daily with meals.  CBG < 70: Implement Hypoglycemia Standing Orders and refer to Hypoglycemia Standing Orders sidebar report  CBG 70 - 120: 0 units  CBG 121 - 150: 1 unit  CBG 151 - 200: 2 units  CBG 201 - 250: 3 units  CBG 251 - 300: 5 units  CBG 301 - 350: 7 units  CBG 351 - 400: 9 units  CBG > 400: call MD and obtain STAT lab verification 06/09/23   [provider]  pantoprazole  (PROTONIX ) 40 MG tablet Take 40 mg by mouth 2 (two) times daily. 06/09/23   [provider]  potassium chloride  (KLOR-CON ) 10 MEQ tablet Take 20 mEq by mouth daily. 08/03/23   [provider]  spironolactone (ALDACTONE) 100 MG tablet Take 100 mg by mouth daily. 07/24/23   [provider]  tamsulosin (FLOMAX) 0.4 MG CAPS capsule Take 0.4 mg by mouth daily after supper. 06/27/23   [provider]  telmisartan (MICARDIS) 40 MG tablet Take 40 mg by mouth daily. 08/18/23   [provider]  torsemide (DEMADEX) 20 MG tablet Take 20 mg by mouth daily. 09/05/23   [provider]  Vitamin D, Ergocalciferol, (DRISDOL) 1.25 MG (50000 UNIT) CAPS capsule Take 50,000 Units by mouth once a week. 04/05/23  [provider]     Critical care time: 55 mins       Early Glisson, MSN, AG-ACNP-BC [facility] 09/18/2023, 1:40 PM  See Amion for pager If no response to pager , please call [phone] until 7pm After 7:00 pm call Elink   [phone]

## 2023-09-18 NOTE — Consult Note (Signed)
 WOC Nurse Consult Note: Reason for Consult: STage 3 Pressure Injury  Wound type: Stage 3 Pressure Injury Coccyx/sacrum  Pressure Injury POA: Yes Measurement: see nursing flowsheet  Wound bed: 75% red 25% yellow  Drainage (amount, consistency, odor) see nursing flowsheet  Periwound: intact  Dressing procedure/placement/frequency:  Cleanse sacral/coccyx wound with NS, apply Medihoney to wound bed daily, fill in wound with dry gauze and cover with silicone foam.   POC discussed with bedside nurse. WOC team will not follow. Re-consult if further needs arise.   Thank you,    Ronni Colace MSN, RN-BC, [employer] [phone]

## 2023-09-18 NOTE — Progress Notes (Signed)
 Orthopedic Tech Progress Note Patient Details:  Cory [name] 07/14/1945 [id number]  LEVEL 1 TRAUMA   Patient ID: Cory [name], male   DOB: [birth year], 78 y.o.   MRN: [id number]  Cory [name] 09/18/2023, 10:42 AM

## 2023-09-18 NOTE — Progress Notes (Signed)
 EEG complete - results pending

## 2023-09-18 NOTE — ED Notes (Signed)
 CCMD called and notified

## 2023-09-18 NOTE — Procedures (Signed)
 Patient Name: Youssef Footman  MRN: [id number]  Epilepsy Attending: Arleene Lack  Referring Physician/Provider: Pinkey Brier, NP  Date: 09/18/2023 Duration: 23.56 mins  Patient history: 78yo M with h/o seizures came with fall. EEG to evaluate for seizure  Level of alertness: comatose  AEDs during EEG study: propofol   Technical aspects: This EEG study was done with scalp electrodes positioned according to the 10-20 International system of electrode placement. Electrical activity was reviewed with band pass filter of 1-70Hz , sensitivity of 7 uV/mm, display speed of 29mm/sec with a 60Hz  notched filter applied as appropriate. EEG data were recorded continuously and digitally stored.  Video monitoring was available and reviewed as appropriate.  Description: EEG showed continuous generalized 2-3 Hz delta slowing. Hyperventilation and photic stimulation were not performed.     ABNORMALITY - Continuous slow, generalized  IMPRESSION: This study is suggestive of severe diffuse encephalopathy. No seizures or epileptiform discharges were seen throughout the recording.   Beuford Garcilazo O Dusti Tetro

## 2023-09-18 NOTE — Progress Notes (Signed)
 Pt transported to CT and back via ventilator w no complications.

## 2023-09-18 NOTE — H&P (Signed)
 [place] May 28, 1945  [id number].    Chief Complaint/Reason for Consult: level 1 trauma, unwitnessed fall  HPI:  This is a 78 yo black male with a significant PMH including DM, CKD, colon cancer in splenic flexure, a flutter on Eliquis , HTN, previous seizures secondary to hyperglycemia, previous encephalopathy, BKA in 2022, previous CVA who apparently lives at home by himself and was found down from a presumed unwitnessed fall this morning by his aid when she came to the house.  From previous experience with the patient, he has 2 nieces who are involved in his care, as well as a twin sister who helps him with his decision making per my previous consult note in January 2025.  The patient presented with pill rolling motions in his BUE, no withdrawal in his LLE, facial grimacing, but did not open his eyes, or make noises.  GCS was around a 5.  His pupils were noted to be unequal by EMS and remained unequal upon arrival, right 4mm, left 2mm.  He has a hematoma on the left side of his forehead.  No other external injuries identified.    ROS: ROS: unable due to AMS  History reviewed. No pertinent family history.  History reviewed. No pertinent past medical history.  History reviewed. No pertinent surgical history.  Social History:  has no history on file for tobacco use, alcohol use, and drug use.  Allergies: No Known Allergies  (Not in a hospital admission)    Physical Exam: Blood pressure 120/84, pulse 85, temperature 98 F (36.7 C), temperature source Temporal, resp. rate 20, height 5\' 11"  (1.803 m), weight 84.5 kg, SpO2 100%. General: unresponsive black male, RLE BKA HEENT: head is normocephalic, but with a moderate hematoma to left forehead.  Sclera are noninjected.  Right pupil 4mm, Left pupil 2mm and reactive.  Ears and nose without any masses or lesions.  Mouth is pink with vomitus present on his beard.  C-collar in place Heart: tachycardic.  Normal s1,s2. No obvious  murmurs, gallops, or rubs noted.  Palpable radial and pedal pulses bilaterally, except RLE with BKA Lungs: CTAB, no wheezes, rhonchi, or rales noted.  Respiratory effort nonlabored.  Intubated in trauma bay with + color change after ETT placed.  There was a lot of gurgling and cuff found to have a hole. This was exchanged by EDP. Abd: soft, ND, +BS, no masses, hernias, or organomegaly GU: normal penis, no blood at meatus. Rectal: deferred at this time due to previous paralytic given. MS: all 3 extremities are symmetrical with no cyanosis, clubbing, or edema.  RLE BKA.  No injuries to back with no step-offs or abrasions noted.  Right femoral CL in place.  Left IO placed in trauma bay as well.  Skin: warm and dry with no masses, lesions, or rashes Neuro: GCS 5 upon arrival.  Pill rolling motions noted in B hands. Psych: AMS   Results for orders placed or performed during the hospital encounter of 09/18/23 (from the past 48 hours)  I-Stat Lactic Acid, ED     Status: Abnormal   Collection Time: 09/18/23 10:36 AM  Result Value Ref Range   Lactic Acid, Venous 2.2 (HH) 0.5 - 1.9 mmol/L   Comment NOTIFIED PHYSICIAN   I-Stat Chem 8, ED     Status: Abnormal   Collection Time: 09/18/23 10:37 AM  Result Value Ref Range   Sodium 139 135 - 145 mmol/L   Potassium 3.8 3.5 - 5.1 mmol/L   Chloride 103  98 - 111 mmol/L   BUN 31 (H) 8 - 23 mg/dL   Creatinine, Ser 4.13 (H) 0.61 - 1.24 mg/dL   Glucose, Bld 244 (H) 70 - 99 mg/dL    Comment: Glucose reference range applies only to samples taken after fasting for at least 8 hours.   Calcium, Ion 1.13 (L) 1.15 - 1.40 mmol/L   TCO2 24 22 - 32 mmol/L   Hemoglobin 15.0 13.0 - 17.0 g/dL   HCT 01.0 27.2 - 53.6 %  Sample to Blood Bank     Status: None   Collection Time: 09/18/23 10:38 AM  Result Value Ref Range   Blood Bank Specimen SAMPLE AVAILABLE FOR TESTING    Sample Expiration      09/21/2023,2359 Performed at [facility], [address],  [place], [place] [address]    DG Chest Port 1 View Result Date: 09/18/2023 CLINICAL DATA:  Trauma. EXAM: PORTABLE CHEST 1 VIEW COMPARISON:  05/11/2023. FINDINGS: Low lung volume. Bilateral lung fields are clear. Bilateral costophrenic angles are clear. Normal cardio-mediastinal silhouette. No acute osseous abnormalities. The soft tissues are within normal limits. Endotracheal tube is seen with its tip approximately 2-2.5 cm above the carina. Enteric tube is seen coursing below the left hemidiaphragm, curling within the proximal stomach with its tip and side hole overlying the fundal region of the stomach. IMPRESSION: No active disease. Support apparatus, as described above. Electronically Signed   By: Beula Brunswick M.D.   On: 09/18/2023 11:00      Assessment/Plan Unwitnessed fall, unknown downtime AMS L scalp hematoma Acute hypoxic respiratory failure - full vent support, may wean as able per medicine DM CKD HTN - was 260/140 in trauma bay.  After sedating meds given for vent, this has come down to 150/90s. A flutter on Eliquis  Previous seizures secondary to hyperglycemia Previous encephalopathy RLE BKA Previous CVAs Known splenic flexure colon cancer - will send Dr. [name] a message to make him aware of current situation.  The patient has undergone full trauma work up with no injuries identified.  Will have EDP reach out to CCM for further management.  We will be available as needed.   I reviewed last 24 h vitals and pain scores, last 24 h labs and trends, and last 24 h imaging results.  Leone Ralphs, [facility] 09/18/2023, 11:04 AM Please see Amion for pager number during day hours 7:00am-4:30pm or 7:00am -11:30am on weekends

## 2023-09-18 NOTE — ED Notes (Signed)
 Lactic acid given to tori v.rn by at

## 2023-09-18 NOTE — Procedures (Signed)
   Procedure Note  Date: 09/18/2023  Procedure: central venous catheter placement--right, femoral vein, without ultrasound guidance  Pre-op diagnosis: inadequate IV access, unable to obtain additional peripheral access Post-op diagnosis: same  Surgeon: Anda Bamberg, MD  Anesthesia: none EBL: <5cc Drains/Implants: triple lumen central venous catheter  Description of procedure: This procedure was performed emergently and therefore informed consent was not obtained. The right groin was prepped and draped in the usual sterile fashion. The right femoral vein was localized with ultrasound guidance. The right femoral vein was accessed using an introducer needle and a guidewire passed through the needle. The needle was removed and a skin nick was made. The tract was dilated and the central venous catheter advanced over the guidewire followed by removal of the guidewire. All ports drew blood easily and all were flushed with saline. The catheter was secured to the skin with suture and a sterile dressing. The patient tolerated the procedure well. There were no immediate complications.    Anda Bamberg, MD General and Trauma Surgery [facility]

## 2023-09-18 NOTE — Progress Notes (Signed)
 Pt transported from TRA B to 3M06 without any complications.

## 2023-09-19 ENCOUNTER — Inpatient Hospital Stay (HOSPITAL_COMMUNITY)

## 2023-09-19 DIAGNOSIS — R0689 Other abnormalities of breathing: Secondary | ICD-10-CM

## 2023-09-19 DIAGNOSIS — R7989 Other specified abnormal findings of blood chemistry: Secondary | ICD-10-CM

## 2023-09-19 DIAGNOSIS — E1111 Type 2 diabetes mellitus with ketoacidosis with coma: Secondary | ICD-10-CM | POA: Diagnosis not present

## 2023-09-19 DIAGNOSIS — N1831 Chronic kidney disease, stage 3a: Secondary | ICD-10-CM | POA: Diagnosis not present

## 2023-09-19 DIAGNOSIS — G9340 Encephalopathy, unspecified: Secondary | ICD-10-CM | POA: Diagnosis not present

## 2023-09-19 DIAGNOSIS — R0603 Acute respiratory distress: Secondary | ICD-10-CM | POA: Diagnosis not present

## 2023-09-19 LAB — ECHOCARDIOGRAM COMPLETE
AR max vel: 2.78 cm2
AV Peak grad: 4.6 mmHg
Ao pk vel: 1.08 m/s
Area-P 1/2: 3.84 cm2
Height: 71 in
MV VTI: 3.4 cm2
S' Lateral: 3.6 cm
Weight: 2821.89 [oz_av]

## 2023-09-19 LAB — COMPREHENSIVE METABOLIC PANEL WITH GFR
ALT: 21 U/L (ref 0–44)
AST: 38 U/L (ref 15–41)
Albumin: 2.4 g/dL — ABNORMAL LOW (ref 3.5–5.0)
Alkaline Phosphatase: 56 U/L (ref 38–126)
Anion gap: 9 (ref 5–15)
BUN: 29 mg/dL — ABNORMAL HIGH (ref 8–23)
CO2: 24 mmol/L (ref 22–32)
Calcium: 8.6 mg/dL — ABNORMAL LOW (ref 8.9–10.3)
Chloride: 109 mmol/L (ref 98–111)
Creatinine, Ser: 2.67 mg/dL — ABNORMAL HIGH (ref 0.61–1.24)
GFR, Estimated: 24 mL/min — ABNORMAL LOW (ref >60)
Glucose, Bld: 72 mg/dL (ref 70–99)
Potassium: 2.6 mmol/L — CL (ref 3.5–5.1)
Sodium: 142 mmol/L (ref 135–145)
Total Bilirubin: 0.7 mg/dL (ref 0.0–1.2)
Total Protein: 5.8 g/dL — ABNORMAL LOW (ref 6.5–8.1)

## 2023-09-19 LAB — CBC
HCT: 34.3 % — ABNORMAL LOW (ref 39.0–52.0)
Hemoglobin: 11.6 g/dL — ABNORMAL LOW (ref 13.0–17.0)
MCH: 25.8 pg — ABNORMAL LOW (ref 26.0–34.0)
MCHC: 33.8 g/dL (ref 30.0–36.0)
MCV: 76.2 fL — ABNORMAL LOW (ref 80.0–100.0)
Platelets: 153 10*3/uL (ref 150–400)
RBC: 4.5 MIL/uL (ref 4.22–5.81)
RDW: 14.5 % (ref 11.5–15.5)
WBC: 7.5 10*3/uL (ref 4.0–10.5)
nRBC: 0 % (ref 0.0–0.2)

## 2023-09-19 LAB — GLUCOSE, CAPILLARY
Glucose-Capillary: 77 mg/dL (ref 70–99)
Glucose-Capillary: 117 mg/dL — ABNORMAL HIGH (ref 70–99)
Glucose-Capillary: 90 mg/dL (ref 70–99)
Glucose-Capillary: 214 mg/dL — ABNORMAL HIGH (ref 70–99)
Glucose-Capillary: 80 mg/dL (ref 70–99)
Glucose-Capillary: 135 mg/dL — ABNORMAL HIGH (ref 70–99)

## 2023-09-19 LAB — PHOSPHORUS: Phosphorus: 2.1 mg/dL — ABNORMAL LOW (ref 2.5–4.6)

## 2023-09-19 LAB — BASIC METABOLIC PANEL WITH GFR
Anion gap: 10 (ref 5–15)
BUN: 29 mg/dL — ABNORMAL HIGH (ref 8–23)
CO2: 25 mmol/L (ref 22–32)
Calcium: 8.6 mg/dL — ABNORMAL LOW (ref 8.9–10.3)
Chloride: 108 mmol/L (ref 98–111)
Creatinine, Ser: 2.87 mg/dL — ABNORMAL HIGH (ref 0.61–1.24)
GFR, Estimated: 22 mL/min — ABNORMAL LOW (ref >60)
Glucose, Bld: 120 mg/dL — ABNORMAL HIGH (ref 70–99)
Potassium: 3.9 mmol/L (ref 3.5–5.1)
Sodium: 143 mmol/L (ref 135–145)

## 2023-09-19 LAB — TROPONIN I (HIGH SENSITIVITY)
Troponin I (High Sensitivity): 158 ng/L (ref <18)
Troponin I (High Sensitivity): 135 ng/L (ref <18)

## 2023-09-19 LAB — HEMOGLOBIN A1C
Hgb A1c MFr Bld: 12.3 % — ABNORMAL HIGH (ref 4.8–5.6)
Hgb A1c MFr Bld: 11.9 % — ABNORMAL HIGH (ref 4.8–5.6)
Mean Plasma Glucose: 306 mg/dL
Mean Plasma Glucose: 295 mg/dL

## 2023-09-19 LAB — TRIGLYCERIDES: Triglycerides: 110 mg/dL (ref <150)

## 2023-09-19 LAB — CK: Total CK: 1841 U/L — ABNORMAL HIGH (ref 49–397)

## 2023-09-19 LAB — MAGNESIUM: Magnesium: 1.9 mg/dL (ref 1.7–2.4)

## 2023-09-19 MED ORDER — DEXMEDETOMIDINE HCL IN NACL 400 MCG/100ML IV SOLN
0.0000 ug/kg/h | INTRAVENOUS | Status: DC
Start: 2023-09-19 — End: 2023-09-19
  Administered 2023-09-19: 0.4 ug/kg/h via INTRAVENOUS
  Filled 2023-09-19: qty 100

## 2023-09-19 MED ORDER — LACTATED RINGERS IV SOLN: INTRAVENOUS | Status: AC

## 2023-09-19 MED ORDER — MAGNESIUM SULFATE 2 GM/50ML IV SOLN
2.0000 g | Freq: Once | INTRAVENOUS | Status: AC
  Administered 2023-09-19: 2 g via INTRAVENOUS
  Filled 2023-09-19: qty 50

## 2023-09-19 MED ORDER — LACTATED RINGERS IV BOLUS: 500.0000 mL | Freq: Once | INTRAVENOUS | Status: AC

## 2023-09-19 MED ORDER — POTASSIUM CHLORIDE 10 MEQ/50ML IV SOLN
10.0000 meq | INTRAVENOUS | Status: AC
  Administered 2023-09-19 (×6): 10 meq via INTRAVENOUS
  Filled 2023-09-19 (×6): qty 50

## 2023-09-19 MED ORDER — SODIUM CHLORIDE 0.9 % IV SOLN
2.0000 g | INTRAVENOUS | Status: DC
  Administered 2023-09-19 – 2023-09-20 (×2): 2 g via INTRAVENOUS
  Filled 2023-09-19 (×3): qty 20

## 2023-09-19 MED ORDER — GUAIFENESIN 200 MG PO TABS
600.0000 mg | ORAL_TABLET | Freq: Two times a day (BID) | ORAL | Status: DC
  Administered 2023-09-19 – 2023-09-20 (×3): 600 mg
  Filled 2023-09-19 (×5): qty 3

## 2023-09-19 MED ORDER — LEVETIRACETAM 500 MG PO TABS
500.0000 mg | ORAL_TABLET | Freq: Two times a day (BID) | ORAL | Status: DC
  Administered 2023-09-19 – 2023-09-20 (×3): 500 mg
  Filled 2023-09-19 (×3): qty 1

## 2023-09-19 MED ORDER — LEVALBUTEROL HCL 0.63 MG/3ML IN NEBU: 0.6300 mg | INHALATION_SOLUTION | Freq: Three times a day (TID) | RESPIRATORY_TRACT | Status: DC | PRN

## 2023-09-19 MED ORDER — POTASSIUM CHLORIDE 20 MEQ PO PACK
40.0000 meq | PACK | Freq: Once | ORAL | Status: AC
  Administered 2023-09-19: 40 meq
  Filled 2023-09-19: qty 2

## 2023-09-19 MED ORDER — INSULIN GLARGINE-YFGN 100 UNIT/ML ~~LOC~~ SOLN
18.0000 [IU] | Freq: Every day | SUBCUTANEOUS | Status: DC
  Administered 2023-09-19 – 2023-09-20 (×2): 18 [IU] via SUBCUTANEOUS
  Filled 2023-09-19 (×2): qty 0.18

## 2023-09-19 MED ORDER — CARVEDILOL 6.25 MG PO TABS
6.2500 mg | ORAL_TABLET | Freq: Two times a day (BID) | ORAL | Status: DC
  Administered 2023-09-19 – 2023-09-20 (×2): 6.25 mg
  Filled 2023-09-19 (×2): qty 1

## 2023-09-19 MED ORDER — FENTANYL CITRATE PF 50 MCG/ML IJ SOSY
25.0000 ug | PREFILLED_SYRINGE | INTRAMUSCULAR | Status: DC | PRN
  Administered 2023-09-20: 50 ug via INTRAVENOUS
  Filled 2023-09-19: qty 1

## 2023-09-19 MED ORDER — APIXABAN 5 MG PO TABS
5.0000 mg | ORAL_TABLET | Freq: Two times a day (BID) | ORAL | Status: DC
  Administered 2023-09-19 – 2023-09-21 (×3): 5 mg
  Filled 2023-09-19 (×3): qty 1

## 2023-09-19 MED ORDER — JUVEN PO PACK
1.0000 | PACK | Freq: Two times a day (BID) | ORAL | Status: DC
  Administered 2023-09-19 – 2023-09-20 (×3): 1
  Filled 2023-09-19 (×3): qty 1

## 2023-09-19 MED ORDER — INSULIN ASPART 100 UNIT/ML IJ SOLN
0.0000 [IU] | INTRAMUSCULAR | Status: DC
  Administered 2023-09-19: 2 [IU] via SUBCUTANEOUS
  Administered 2023-09-19: 5 [IU] via SUBCUTANEOUS
  Administered 2023-09-20 (×2): 3 [IU] via SUBCUTANEOUS
  Administered 2023-09-20: 5 [IU] via SUBCUTANEOUS
  Administered 2023-09-20: 3 [IU] via SUBCUTANEOUS
  Administered 2023-09-21: 2 [IU] via SUBCUTANEOUS

## 2023-09-19 MED ORDER — PROSOURCE TF20 ENFIT COMPATIBL EN LIQD
60.0000 mL | Freq: Two times a day (BID) | ENTERAL | Status: DC
  Administered 2023-09-19 – 2023-09-20 (×3): 60 mL
  Filled 2023-09-19 (×3): qty 60

## 2023-09-19 MED ORDER — FENTANYL CITRATE PF 50 MCG/ML IJ SOSY: 25.0000 ug | PREFILLED_SYRINGE | INTRAMUSCULAR | Status: DC | PRN

## 2023-09-19 MED ORDER — POTASSIUM PHOSPHATES 15 MMOLE/5ML IV SOLN
15.0000 mmol | Freq: Once | INTRAVENOUS | Status: AC
  Administered 2023-09-19: 15 mmol via INTRAVENOUS
  Filled 2023-09-19: qty 5

## 2023-09-19 MED ORDER — OSMOLITE 1.5 CAL PO LIQD
1000.0000 mL | ORAL | Status: DC
  Administered 2023-09-19: 1000 mL

## 2023-09-19 MED ORDER — HEPARIN SODIUM (PORCINE) 5000 UNIT/ML IJ SOLN
5000.0000 [IU] | Freq: Three times a day (TID) | INTRAMUSCULAR | Status: DC
  Administered 2023-09-19: 5000 [IU] via SUBCUTANEOUS
  Filled 2023-09-19: qty 1

## 2023-09-19 MED ORDER — PERFLUTREN LIPID MICROSPHERE
1.0000 mL | INTRAVENOUS | Status: AC | PRN
  Administered 2023-09-19: 2 mL via INTRAVENOUS

## 2023-09-19 NOTE — Progress Notes (Addendum)
 Pharmacy Electrolyte Replacement  Recent Labs:  Recent Labs    09/19/23 0627 09/19/23 0910  K 2.6*  --   MG  --  1.9  PHOS  --  2.1*  CREATININE 2.67*  --     Low Critical Values (K </= 2.5, Phos </= 1, Mg </= 1) Present: None  MD Contacted: none  Plan:  KCL 40meq per tube x1, IV 10meq x6h  Addendum 1027 Kphos IV 15mmol x1 Mg IV 2g x 1   Harvest Lineman, PharmD PGY1 Pharmacy Resident

## 2023-09-19 NOTE — Progress Notes (Signed)
 LTM EEG hooked up and running - no initial skin breakdown - push button tested - [facility] monitoring.

## 2023-09-19 NOTE — Telephone Encounter (Addendum)
 REPLIED TO STAFF MESSAGE WITH THIS INFORMATION/MW   ----- Message from Kyra Phy sent at 09/19/2023  2:24 PM EDT ----- He can hold Eliquis  x 3 days prior to procedure ----- Message ----- From: Lowell Rude, RN Sent: 09/19/2023  11:14 AM EDT To: Arun K Thukkani, MD   ----- Message ----- From: Hulda Mage Sent: 09/18/2023   9:49 AM EDT To: Lowell Rude, RN  Hello We received the cardiac clearance but I still need to know the Eliquis  instructions on holding this medication before sx. Please advise.  Thanks Hulda Mage, CMA On behalf of Dr Candyce Champagne, MD

## 2023-09-19 NOTE — Progress Notes (Signed)
 Initial Nutrition Assessment  DOCUMENTATION CODES:   Not applicable  INTERVENTION:  Initiate TF via OGT: Start Osmolite 1.5 at 20ml/hr and advance by 10ml q10h to goal rate of 26ml/hr ( per day) 60ml ProSource TF20 BID  Provides 1960 kcal, 115g protein and 914ml free water daily  Juven BID to support wound healing  NUTRITION DIAGNOSIS:  Inadequate oral intake related to acute illness as evidenced by NPO status.  GOAL:  Patient will meet greater than or equal to 90% of their needs  MONITOR:  Labs, Vent status, Weight trends, TF tolerance, I & O's, Skin  REASON FOR ASSESSMENT:  Ventilator, Consult Enteral/tube feeding initiation and management  ASSESSMENT:  Pt admitted as level 1 trauma after an unwitnessed fall. PMH significant for DM, CKD, colon cancer at splenic flexure, a flutter on Eliquis , HTN, previous seizures,  R BKA (2022), prior CVA.   Patient is currently intubated on ventilator support for airway protection.  MV: 9.6 L/min Temp (24hrs), Avg:98.4 F (36.9 C), Min:97.6 F (36.4 C), Max:99.1 F (37.3 C) MAP (cuff): 92  No plans for extubation today d/t agitation, tachycardia and increased secretions.  Etiology of encephalopathy unknown.   No family at bedside at time of visit.  Per review of chart, pt lives independently at home.  Has a caregiver that checks on him throughout the week.   Admit weight: 79.9 kg Current weight: 80 kg Review of weight history over the last year appears to be widely variable. Weights trending between 186-210 lbs.   Drains/lines:  UOP: x24 hours OGT (side port within gastric cardia region) R femoral CVC triple lumen  Medications: SSI 0-15 units q4h, semglee  18 units daily Drips: Abx LR @ 168ml/hr Potassium chloride  Potassium phosphate Propofol  @ 72mcg/min  Labs:  Potassium 2.6 BUN 29 Cr 2.67 Phos 2.1 GFR 24 Lactic acid 2.2 (5/12) CBG's 77-401 x24 hours  NUTRITION - FOCUSED PHYSICAL EXAM: Flowsheet  Row Most Recent Value  Orbital Region No depletion  Upper Arm Region Mild depletion  Thoracic and Lumbar Region No depletion  Buccal Region Unable to assess  Temple Region Mild depletion  Clavicle Bone Region Mild depletion  Clavicle and Acromion Bone Region No depletion  Scapular Bone Region Unable to assess  Dorsal Hand Unable to assess  [hand mits]  Patellar Region Severe depletion  Anterior Thigh Region Severe depletion  Posterior Calf Region Moderate depletion  Edema (RD Assessment) None  Hair Reviewed  Eyes Unable to assess  Mouth Reviewed  Skin Reviewed  Nails Unable to assess    Diet Order:   Diet Order             Diet NPO time specified  Diet effective now                   EDUCATION NEEDS:  No education needs have been identified at this time  Skin:  Skin Assessment: Skin Integrity Issues: Skin Integrity Issues:: Stage III, Other (Comment) Stage III: medial sacrum Other: L cervical skin tear  Last BM:  5/12  Height:  Ht Readings from Last 1 Encounters:  09/18/23 5\' 11"  (1.803 m)    Weight:  Wt Readings from Last 1 Encounters:  09/19/23 80 kg   BMI:  Body mass index is 24.6 kg/m.  Estimated Nutritional Needs:   Kcal:  1900-2100  Protein:  105-120g  Fluid:  >/=1.9L  Cory [name], RDN, LDN Clinical Nutrition See AMiON for contact information.

## 2023-09-19 NOTE — Progress Notes (Addendum)
 NAME:  Cory [name], MRN:  [id number], DOB:  06-13-1945, LOS: 1 ADMISSION DATE:  09/18/2023, CONSULTATION DATE:  09/18/2023 REFERRING MD:  Dr. Randal Bury, CHIEF COMPLAINT:  AMS   History of Present Illness:  HPI obtained from EMR as pt is sedated and intubated on MV.  See additional chart MRN [id number].   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  GCS 5 on arrival with pill rolling motions noted in BUEs.  Intubated for airway protection.  Workup with CT head, chest, abd/pelvis negative for traumatic injuries.  Labs noted for glucose 437, BUN/ sCr 26/ 2.25, t. Bili 1.3, albumin 3.4, CK 621, lactic 2.2, INR 1.3, H/H 13.9/ 42.  Currently sedated on propofol  and fentanyl  due to biting of ett.  PCCM consulted for medical admit.    Of note, previous admit in 04/2023 with COVID, rhabdomyolysis, DKA, and seizures 2/2 hyperglycemia.  Spoke with niece/ HPOA [name] by phone who states pt was in his normal state of health on Saturday, after taking him to his scheduled MD appointment, only recent complaint of sneezing.  Otherwise pt lives independently and has a caregiver that checks on him throughout the week.   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 rate (!) 21, height 5\' 11"  (1.803 m), weight 80 kg, SpO2 100%.    Vent Mode: PRVC FiO2 (%):  [40 %-100 %] 40 % Set Rate:  [16 bmp-20 bmp] 16 bmp Vt Set:   [600 mL] 600 mL PEEP:  [5 cmH20] 5 cmH20 Plateau Pressure:  [17 cmH20-19 cmH20] 19 cmH20   Intake/Output Summary (Last 24 hours) at 09/19/2023 0931 Last data filed at 09/19/2023 0700 Gross per 24 hour  Intake 4434.45 ml  Output 1215 ml  Net 3219.45 ml   Filed Weights   09/18/23 1028 09/18/23 1345 09/19/23 0500  Weight: 84.5 kg 79.9 kg 80 kg    Examination: Fent 125, propofol  20 >paused General:  critically ill elderly male initially sedated in NAD HEENT: MM pink/moist, ETT/ OGT, R pupil 4/ nr, L pupil 2/r, upward gaze Neuro: opens eyes, not following commands but moves all extremities, gagging, coughing with vent dyssynchrony on WUA CV: AF, rates 80>  140's PULM:  MV supported, diffuse rhonchi, thick yellowish secretions GI: soft, bs+, ND Extremities: warm/dry, R BKA, no LE edema Skin: no rashes, posterior not examined  Labs> K 2.6, bicarb 24, BUN/ sCr 29/ 2.44> 29/ 2.67, ammonia 21, CK 621> 1841  UOP 939ml/ 24hrs Net +3.2L afebrile   Resolved Hospital Problem list    Assessment & Plan:   Acute encephalopathy, suspected metabolic  Unwitnessed fall with left scalp hematoma on eliquis  - previous hx of seizures in 04/2023 felt 2/2 to DKA - CTH neg P:  - EEG 5/12> severe diffuse encephalopathy, no seizures/ epileptiform discharges - UDS neg.  Ammonia neg - etiology of encephalopathy remains unclear.  Start to wean sedation today, serial  neuro exams - seizure precautions, given prior seizure> start empiric keppra   - supportive care as below - consider MRI brain if not improving - remains afebrile, normal WBC.  Treating empirically for aspiration as below   Acute respiratory insufficiency related to above, likely aspiration PNA - found covered in emesis P:  - cont full MV support, 4-8cc/kg IBW with goal Pplat <30 and DP<15  - VAP prevention protocol/ PPI - PAD protocol for sedation> change propofol  over to precedex , minimize fentanyl  gtt to prn, RASS goal 0/-1, w/  bowel regimen - intermittent CXR/ ABG - wean FiO2 as able for SpO2 >92% - daily SAT & SBT> too agitated on WUA/ tachycardia and increased secretions > not a candidate for extubation today - prn bronchodilators, guaifenesin   - increased in thick yellow secretions> will send trach asp and start ctx, MRSA PCR neg - start EN per RD - RVP neg  Addendum> did not tolerate precedex  due to hypotension.  Will continue low dose propofol  and prn fentanyl .  LR bolus x 1  CKD3a Elevated CK AGMA/ lactic acidosis, improving - baseline sCr from December/ April 2- 2.25 Hypokalemia P:  - sCr up slightly, uop adequate but CK up, K down.  Will increase MIVF to 150 ml/ hr - KCL replete, awaiting mag.  Recheck BMET after k runs complete - repeat CK in am  - cont foley  - trend renal indices  - strict I/Os, daily wts - avoid nephrotoxins, renal dose meds, hemodynamic support as above   Uncontrolled DMT2 w/ DKA - BHA 4 yest, now AG closed - cont change from resistant to mSSI prn, semglee  18u> CBGs in 70's overnight but starting EN - A1c pending   Chronic Atrial flutter on Eliquis  - tele monitor  - remains non focal, CTH neg, resume pta eliquis  > monitor neuro closely - optimize electrolytes   HTN HLD Elevated trop - remains normotensive, likely d/c CVL on PIV established - hold statin w/ elevated CK - pending repeat trop- like 2/2 demand stress, pending echo - cont to hold pta antihypertensives    Colon Cancer of splenic flexure - followed by Dr. [name], previous surgery in January put off given December hospitalization with plans to reschedule soon.  Will be postponed again given hospitalization.    Stage 3 pressure injury coccyx/ sacrum- POA - per [facility] recommendations  Best Practice (right click and "Reselect all SmartList Selections" daily)   Diet/type: NPO; EN per RD DVT prophylaxis DOAC Pressure ulcer(s): N/A GI prophylaxis: PPI Lines: Central line- R femoral Foley:  Yes, and  it is still needed Code Status:  full code Last date of multidisciplinary goals of care discussion [5/12]  Niece, [name] is HPOA (on file)> pending update 5/13  Addendum 1317> called [name], no answer, VM left.  Labs   CBC: Recent Labs  Lab 09/18/23 1037 09/18/23 1038 09/18/23 1206 09/19/23 0627  WBC  --  9.6  --  7.5  HGB 15.0 13.9 11.6* 11.6*  HCT 44.0 42.9 34.0* 34.3*  MCV  --  77.6*  --  76.2*  PLT  --  166  --  153    Basic Metabolic Panel: Recent Labs  Lab 09/18/23 1037 09/18/23 1038 09/18/23 1206 09/18/23 1547 09/19/23 0627  NA 139 138 136 140 142  K 3.8 4.0 3.2* 3.4* 2.6*  CL 103 99  --  104 109  CO2  --  23  --  20* 24  GLUCOSE 435* 437*  --  378* 72  BUN 31* 26*  --  29* 29*  CREATININE 2.10* 2.25*  --  2.44* 2.67*  CALCIUM  --  9.5  --  9.1 8.6*   GFR: Estimated Creatinine Clearance: 25.1 mL/min (A) (by C-G formula based on SCr of 2.67 mg/dL (H)). Recent Labs  Lab 09/18/23 1036 09/18/23 1038 09/18/23 1406 09/19/23 0627  WBC  --  9.6  --  7.5  LATICACIDVEN 2.2*  --  2.2*  --     Liver Function Tests: Recent Labs  Lab 09/18/23 1038 09/19/23 0627  AST 29 38  ALT 17 21  ALKPHOS 82 56  BILITOT 1.3* 0.7  PROT 7.9 5.8*  ALBUMIN 3.4* 2.4*   No results for input(s): "LIPASE", "AMYLASE" in the last 168 hours. Recent Labs  Lab 09/18/23 1547  AMMONIA 21    ABG    Component Value Date/Time   PHART 7.488 (H) 09/18/2023 1206   PCO2ART 24.6 (L) 09/18/2023 1206   PO2ART 463 (H) 09/18/2023 1206   HCO3 18.7 (L) 09/18/2023 1206   TCO2 19 (L) 09/18/2023 1206   ACIDBASEDEF 3.0 (H) 09/18/2023 1206   O2SAT 100 09/18/2023 1206     Coagulation Profile: Recent Labs  Lab 09/18/23 1038  INR 1.3*    Cardiac Enzymes: Recent Labs  Lab 09/18/23 1038 09/19/23 0627  CKTOTAL 621* 1,841*  CKMB 17.9*  --     HbA1C: No results found for: "HGBA1C"  CBG: Recent Labs  Lab 09/18/23 1506 09/18/23 1933 09/18/23 2337 09/19/23 0340 09/19/23 0806   GLUCAP 401* 204* 93 77 117*    Allergies No Known Allergies   Home Medications  Prior to Admission medications   Medication Sig Start Date End Date Taking? Authorizing Provider  atorvastatin (LIPITOR) 20 MG tablet Take 20 mg by mouth daily. 06/27/23   [provider]  carvedilol  (COREG ) 25 MG tablet Take 25 mg by mouth 2 (two) times daily. 07/24/23   [provider]  Continuous Glucose Sensor (DEXCOM G6 SENSOR) MISC Inject 1 Device into the skin See admin instructions. Change device every 10 days. 09/17/23   [provider]  folic acid (FOLVITE) 1 MG tablet Take 1 mg by mouth daily.    [provider]  hydrALAZINE  (APRESOLINE ) 25 MG tablet Take 25 mg by mouth 3 (three) times daily. 06/27/23   [provider]  INVOKANA 300 MG TABS tablet Take 300 mg by mouth daily. 09/18/23   [provider]  KERENDIA 10 MG TABS Take 1 tablet by mouth daily. 09/18/23   [provider]  LANTUS  SOLOSTAR 100 UNIT/ML Solostar Pen Inject 45 Units into the skin at bedtime. 06/19/23   [provider]  metolazone (ZAROXOLYN) 2.5 MG tablet Take 2.5 mg by mouth every other day. 04/03/23   [provider]  NOVOLOG  100 UNIT/ML injection Inject 0-9 Units into the skin 3 (three) times daily with meals. Inject 0-9 Units into the skin 3 (three) times daily with meals.  CBG < 70: Implement Hypoglycemia Standing Orders and refer to Hypoglycemia Standing Orders sidebar report  CBG 70 - 120: 0 units  CBG 121 - 150: 1 unit  CBG 151 - 200: 2 units  CBG 201 - 250: 3 units  CBG 251 - 300: 5 units  CBG 301 - 350: 7 units  CBG 351 - 400: 9 units  CBG > 400: call MD and obtain STAT lab verification 06/09/23   [provider]  pantoprazole  (PROTONIX ) 40 MG tablet Take 40 mg by mouth  2 (two) times daily. 06/09/23   [provider]  potassium chloride  (KLOR-CON ) 10 MEQ tablet Take 20 mEq by mouth daily. 08/03/23   [provider]   spironolactone (ALDACTONE) 100 MG tablet Take 100 mg by mouth daily. 07/24/23   [provider]  tamsulosin (FLOMAX) 0.4 MG CAPS capsule Take 0.4 mg by mouth daily after supper. 06/27/23   [provider]  telmisartan (MICARDIS) 40 MG tablet Take 40 mg by mouth daily. 08/18/23   [provider]  torsemide (DEMADEX) 20 MG tablet Take 20 mg by mouth daily. 09/05/23   [provider]  Vitamin D, Ergocalciferol, (DRISDOL) 1.25 MG (50000 UNIT) CAPS capsule Take 50,000 Units by mouth once a week. 04/05/23   [provider]     Critical care time: 36 mins       Early Glisson, MSN, AG-ACNP-BC [facility] 09/19/2023, 9:31 AM  See Amion for pager If no response to pager , please call [phone] until 7pm After 7:00 pm call Elink  [phone]

## 2023-09-19 NOTE — TOC CM/SW Note (Signed)
 Transition of Care [facility]) - Inpatient Brief Assessment   Patient Details  Name: Trung Wenzl MRN: [id number] Date of Birth: 08-04-1945  Transition of Care [facility]) CM/SW Contact:    Tom-Johnson, Angelique Ken, RN Phone Number: 09/19/2023, 2:33 PM   Clinical Narrative:  Patient presented to the ED after an unwitnessed fall and covered in Feces and Vomit at home. Noted to have Hematoma to his Rt Forehead. Admitted with Acute Encephalopathy.  Patient has hx of Colon Cancer, A-Flutter on Eliquis , DM, CKD, Seizures, BKA and CVA.  Patient is currently intubated and sedated, on IV abx. Neurology following for possible Seizures. EEG showed Encephalopathy.  WOC following for Stage 3 Ulcer to his Sacrum.   Patient is from home alone, per notes, patient has a twin sister and nieces that assists with his care.    Patient not Medically ready for discharge.  CM will continue to follow as patient progresses with care towards discharge.        Transition of Care Asessment:

## 2023-09-19 NOTE — Progress Notes (Signed)
 Transition of Care [facility]) - CAGE-AID Screening   Patient Details  Name: Cory [name] MRN: [id number] Date of Birth: [birth year]  Transition of Care [facility]) CM/SW Contact:    Marvis Sluder, RN Phone Number: 09/19/2023, 6:29 AM   Clinical Narrative:  Intubated, unable to participate in screening  CAGE-AID Screening: Substance Abuse Screening unable to be completed due to: : Patient unable to participate

## 2023-09-19 NOTE — Plan of Care (Signed)
  Problem: Education: Goal: Knowledge of General Education information will improve Description: Including pain rating scale, medication(s)/side effects and non-pharmacologic comfort measures Outcome: Progressing   Problem: Health Behavior/Discharge Planning: Goal: Ability to manage health-related needs will improve Outcome: Progressing   Problem: Clinical Measurements: Goal: Ability to maintain clinical measurements within normal limits will improve Outcome: Progressing Goal: Will remain free from infection Outcome: Progressing Goal: Diagnostic test results will improve Outcome: Progressing Goal: Respiratory complications will improve Outcome: Progressing Goal: Cardiovascular complication will be avoided Outcome: Progressing   Problem: Nutrition: Goal: Adequate nutrition will be maintained Outcome: Progressing   Problem: Activity: Goal: Risk for activity intolerance will decrease Outcome: Progressing   Problem: Coping: Goal: Level of anxiety will decrease Outcome: Progressing   Problem: Elimination: Goal: Will not experience complications related to bowel motility Outcome: Progressing Goal: Will not experience complications related to urinary retention Outcome: Progressing   Problem: Pain Managment: Goal: General experience of comfort will improve and/or be controlled Outcome: Progressing   Problem: Skin Integrity: Goal: Risk for impaired skin integrity will decrease Outcome: Progressing   Problem: Education: Goal: Ability to describe self-care measures that may prevent or decrease complications (Diabetes Survival Skills Education) will improve Outcome: Progressing Goal: Individualized Educational Video(s) Outcome: Progressing   Problem: Fluid Volume: Goal: Ability to maintain a balanced intake and output will improve Outcome: Progressing

## 2023-09-19 NOTE — Progress Notes (Signed)
 Sedation weaned down to propofol  20 with prn fentanyl , no purposeful movement, becomes agitated, coughing with stimulation.  No clinical seizures.    - proceed with overnight LTM and likely MRI brain/ neurology consult in am if LTM neg and no neuro improvement.  Discussed overnight EEG w/ Neurology      Early Glisson, MSN, AG-ACNP-BC [facility] 09/19/2023, 5:50 PM  See Amion for pager If no response to pager , please call [phone] until 7pm After 7:00 pm call Elink  [phone]

## 2023-09-20 ENCOUNTER — Inpatient Hospital Stay (HOSPITAL_COMMUNITY)

## 2023-09-20 DIAGNOSIS — G9341 Metabolic encephalopathy: Secondary | ICD-10-CM | POA: Diagnosis not present

## 2023-09-20 DIAGNOSIS — A419 Sepsis, unspecified organism: Secondary | ICD-10-CM | POA: Diagnosis not present

## 2023-09-20 DIAGNOSIS — J690 Pneumonitis due to inhalation of food and vomit: Secondary | ICD-10-CM | POA: Diagnosis not present

## 2023-09-20 DIAGNOSIS — I5021 Acute systolic (congestive) heart failure: Secondary | ICD-10-CM | POA: Diagnosis not present

## 2023-09-20 DIAGNOSIS — E785 Hyperlipidemia, unspecified: Secondary | ICD-10-CM | POA: Diagnosis not present

## 2023-09-20 DIAGNOSIS — I4892 Unspecified atrial flutter: Secondary | ICD-10-CM | POA: Diagnosis not present

## 2023-09-20 DIAGNOSIS — E1110 Type 2 diabetes mellitus with ketoacidosis without coma: Secondary | ICD-10-CM

## 2023-09-20 DIAGNOSIS — J9600 Acute respiratory failure, unspecified whether with hypoxia or hypercapnia: Secondary | ICD-10-CM | POA: Diagnosis not present

## 2023-09-20 DIAGNOSIS — R569 Unspecified convulsions: Secondary | ICD-10-CM | POA: Diagnosis not present

## 2023-09-20 LAB — LIPID PANEL
Cholesterol: 106 mg/dL (ref 0–200)
HDL: 45 mg/dL (ref >40)
LDL Cholesterol: 39 mg/dL (ref 0–99)
Total CHOL/HDL Ratio: 2.4 ratio
Triglycerides: 110 mg/dL (ref <150)
VLDL: 22 mg/dL (ref 0–40)

## 2023-09-20 LAB — CBC
HCT: 33.9 % — ABNORMAL LOW (ref 39.0–52.0)
Hemoglobin: 11 g/dL — ABNORMAL LOW (ref 13.0–17.0)
MCH: 25.8 pg — ABNORMAL LOW (ref 26.0–34.0)
MCHC: 32.4 g/dL (ref 30.0–36.0)
MCV: 79.4 fL — ABNORMAL LOW (ref 80.0–100.0)
Platelets: 140 10*3/uL — ABNORMAL LOW (ref 150–400)
RBC: 4.27 MIL/uL (ref 4.22–5.81)
RDW: 15.2 % (ref 11.5–15.5)
WBC: 7.8 10*3/uL (ref 4.0–10.5)
nRBC: 0 % (ref 0.0–0.2)

## 2023-09-20 LAB — POCT I-STAT 7, (LYTES, BLD GAS, ICA,H+H)
Acid-base deficit: 1 mmol/L (ref 0.0–2.0)
Bicarbonate: 22.9 mmol/L (ref 20.0–28.0)
Calcium, Ion: 1.16 mmol/L (ref 1.15–1.40)
HCT: 27 % — ABNORMAL LOW (ref 39.0–52.0)
Hemoglobin: 9.2 g/dL — ABNORMAL LOW (ref 13.0–17.0)
O2 Saturation: 99 %
Patient temperature: 97.6
Potassium: 4.2 mmol/L (ref 3.5–5.1)
Sodium: 142 mmol/L (ref 135–145)
TCO2: 24 mmol/L (ref 22–32)
pCO2 arterial: 31.5 mmHg — ABNORMAL LOW (ref 32–48)
pH, Arterial: 7.467 — ABNORMAL HIGH (ref 7.35–7.45)
pO2, Arterial: 112 mmHg — ABNORMAL HIGH (ref 83–108)

## 2023-09-20 LAB — T4, FREE: Free T4: 1.29 ng/dL — ABNORMAL HIGH (ref 0.61–1.12)

## 2023-09-20 LAB — BASIC METABOLIC PANEL WITH GFR
Anion gap: 9 (ref 5–15)
BUN: 40 mg/dL — ABNORMAL HIGH (ref 8–23)
CO2: 22 mmol/L (ref 22–32)
Calcium: 8.5 mg/dL — ABNORMAL LOW (ref 8.9–10.3)
Chloride: 109 mmol/L (ref 98–111)
Creatinine, Ser: 2.77 mg/dL — ABNORMAL HIGH (ref 0.61–1.24)
GFR, Estimated: 23 mL/min — ABNORMAL LOW (ref >60)
Glucose, Bld: 143 mg/dL — ABNORMAL HIGH (ref 70–99)
Potassium: 3.4 mmol/L — ABNORMAL LOW (ref 3.5–5.1)
Sodium: 140 mmol/L (ref 135–145)

## 2023-09-20 LAB — GLUCOSE, CAPILLARY
Glucose-Capillary: 173 mg/dL — ABNORMAL HIGH (ref 70–99)
Glucose-Capillary: 109 mg/dL — ABNORMAL HIGH (ref 70–99)
Glucose-Capillary: 183 mg/dL — ABNORMAL HIGH (ref 70–99)
Glucose-Capillary: 201 mg/dL — ABNORMAL HIGH (ref 70–99)
Glucose-Capillary: 164 mg/dL — ABNORMAL HIGH (ref 70–99)
Glucose-Capillary: 118 mg/dL — ABNORMAL HIGH (ref 70–99)

## 2023-09-20 LAB — MAGNESIUM: Magnesium: 2.2 mg/dL (ref 1.7–2.4)

## 2023-09-20 LAB — CK: Total CK: 630 U/L — ABNORMAL HIGH (ref 49–397)

## 2023-09-20 LAB — PHOSPHORUS: Phosphorus: 3.7 mg/dL (ref 2.5–4.6)

## 2023-09-20 LAB — TSH: TSH: 0.545 u[IU]/mL (ref 0.350–4.500)

## 2023-09-20 MED ORDER — POTASSIUM CHLORIDE 20 MEQ PO PACK
40.0000 meq | PACK | Freq: Once | ORAL | Status: DC
Start: 2023-09-20 — End: 2023-09-20

## 2023-09-20 MED ORDER — METOPROLOL TARTRATE 5 MG/5ML IV SOLN: 2.5000 mg | INTRAVENOUS | Status: DC | PRN

## 2023-09-20 MED ORDER — POTASSIUM CHLORIDE 20 MEQ PO PACK
60.0000 meq | PACK | Freq: Once | ORAL | Status: AC
  Administered 2023-09-20: 60 meq
  Filled 2023-09-20: qty 3

## 2023-09-20 MED ORDER — SODIUM CHLORIDE 3 % IN NEBU
4.0000 mL | INHALATION_SOLUTION | Freq: Two times a day (BID) | RESPIRATORY_TRACT | Status: AC
  Administered 2023-09-20 – 2023-09-23 (×6): 4 mL via RESPIRATORY_TRACT
  Filled 2023-09-20 (×6): qty 15

## 2023-09-20 MED ORDER — LEVETIRACETAM (KEPPRA) 500 MG/5 ML ADULT IV PUSH
500.0000 mg | Freq: Two times a day (BID) | INTRAVENOUS | Status: DC
  Administered 2023-09-20: 500 mg via INTRAVENOUS
  Filled 2023-09-20 (×2): qty 5

## 2023-09-20 MED ORDER — ACETAMINOPHEN 160 MG/5ML PO SOLN
650.0000 mg | ORAL | Status: DC | PRN
  Administered 2023-09-20: 650 mg
  Filled 2023-09-20: qty 20.3

## 2023-09-20 MED ORDER — METOPROLOL TARTRATE 12.5 MG HALF TABLET
12.5000 mg | ORAL_TABLET | Freq: Four times a day (QID) | ORAL | Status: DC
  Administered 2023-09-20 – 2023-09-21 (×2): 12.5 mg
  Filled 2023-09-20 (×2): qty 1

## 2023-09-20 MED ORDER — LEVETIRACETAM 500 MG PO TABS: 500.0000 mg | ORAL_TABLET | Freq: Two times a day (BID) | ORAL | Status: DC

## 2023-09-20 MED ORDER — POTASSIUM CHLORIDE 10 MEQ/100ML IV SOLN: 10.0000 meq | INTRAVENOUS | Status: DC

## 2023-09-20 MED ORDER — INSULIN GLARGINE-YFGN 100 UNIT/ML ~~LOC~~ SOLN
14.0000 [IU] | Freq: Every day | SUBCUTANEOUS | Status: DC
  Filled 2023-09-20: qty 0.14

## 2023-09-20 MED ORDER — METOPROLOL TARTRATE 12.5 MG HALF TABLET: 12.5000 mg | ORAL_TABLET | Freq: Four times a day (QID) | ORAL | Status: DC

## 2023-09-20 MED ORDER — IPRATROPIUM BROMIDE 0.02 % IN SOLN
0.5000 mg | Freq: Four times a day (QID) | RESPIRATORY_TRACT | Status: DC
  Administered 2023-09-20 – 2023-09-22 (×8): 0.5 mg via RESPIRATORY_TRACT
  Filled 2023-09-20 (×8): qty 2.5

## 2023-09-20 NOTE — Procedures (Signed)
 Extubation Procedure Note  Patient Details:   Name: Cory [name] DOB: 06-Apr-1946 MRN: [id number]   Airway Documentation:    Vent end date: 09/20/23 Vent end time: 1650   Evaluation  O2 sats: stable throughout Complications: No apparent complications Patient did tolerate procedure well. Bilateral Breath Sounds: Diminished, Clear   Yes  Avonda Toso 09/20/2023, 4:50 PM

## 2023-09-20 NOTE — Procedures (Addendum)
 Patient Name: Cory [name]  MRN: [id number]  Epilepsy Attending: Arleene Lack  Referring Physician/Provider: Imogene Mana, NP  Duration: 09/19/2023 1917 to 09/20/2023 1917  Patient history: 78yo M with h/o seizures came with fall. EEG to evaluate for seizure   Level of alertness: comatose   AEDs during EEG study: propofol , LEV   Technical aspects: This EEG study was done with scalp electrodes positioned according to the 10-20 International system of electrode placement. Electrical activity was reviewed with band pass filter of 1-70Hz , sensitivity of 7 uV/mm, display speed of 24mm/sec with a 60Hz  notched filter applied as appropriate. EEG data were recorded continuously and digitally stored.  Video monitoring was available and reviewed as appropriate.   Description: EEG showed continuous generalized 3-5hz  theta- delta slowing. Hyperventilation and photic stimulation were not performed.      ABNORMALITY - Continuous slow, generalized   IMPRESSION: This study is suggestive of severe diffuse encephalopathy. No seizures or epileptiform discharges were seen throughout the recording.     Gabreil [place] O Tyann Niehaus

## 2023-09-20 NOTE — Progress Notes (Addendum)
 NAME:  Cory [name], MRN:  [id number], DOB:  1946-04-10, LOS: 2 ADMISSION DATE:  09/18/2023, CONSULTATION DATE:  09/18/2023 REFERRING MD:  Dr. Randal Bury, CHIEF COMPLAINT:  AMS   History of Present Illness:  HPI obtained from EMR as pt is sedated and intubated on MV.  See additional chart MRN [id number].   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  GCS 5 on arrival with pill rolling motions noted in BUEs.  Intubated for airway protection.  Workup with CT head, chest, abd/pelvis negative for traumatic injuries.  Labs noted for glucose 437, BUN/ sCr 26/ 2.25, t. Bili 1.3, albumin 3.4, CK 621, lactic 2.2, INR 1.3, H/H 13.9/ 42.  Currently sedated on propofol  and fentanyl  due to biting of ett.  PCCM consulted for medical admit.    Of note, previous admit in 04/2023 with COVID, rhabdomyolysis, DKA, and seizures 2/2 hyperglycemia.  Spoke with niece/ HPOA [name] by phone who states pt was in his normal state of health on Saturday, after taking him to his scheduled MD appointment, only recent complaint of sneezing.  Otherwise pt lives independently and has a caregiver that checks on him throughout the week.   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 rate 16, height 5\' 11"  (1.803 m), weight 84.3 kg, SpO2 100%.    Vent Mode: PRVC FiO2 (%):  [40 %] 40 % Set Rate:  [16 bmp] 16 bmp Vt Set:  [600 mL] 600 mL PEEP:  [5 cmH20] 5 cmH20 Plateau Pressure:  [18 cmH20-20 cmH20] 19 cmH20   Intake/Output Summary (Last 24 hours) at 09/20/2023 0914 Last data filed at 09/20/2023 0800 Gross per 24 hour  Intake 3320.65 ml  Output 835 ml  Net 2485.65 ml   Filed Weights   09/18/23 1345 09/19/23 0500 09/20/23 0324  Weight: 79.9 kg 80 kg 84.3 kg    Examination: Off sedation General:  critically ill older male lying in bed in NAD HEENT: MM pink/moist, ETT with bite block, OGT, R pupil/ nr, L/ 2r, small left frontal hematoma Neuro: eyes open, intermittently tracks and follows simple commands, MAE spont CV: rr ir, flutter  PULM:  non labored, coarse, mild yellowish secretions, placed on PSV now on 5/5 with TVs 400-500's GI: soft, bs+, NT, foley- cyu Extremities: warm/dry, no edema, R BKA Skin: no rashes   Labs> pending lab draw  UOP 817ml/ 24hrs +2.4L Net +5.7L    Resolved Hospital Problem list    Assessment & Plan:   Acute metabolic encephalopathy  Unwitnessed fall with left scalp hematoma on eliquis  - previous hx of seizures in 04/2023 felt 2/2 to DKA - CTH neg, UDS neg, Ammonia neg - spot EEG 5/12> severe diffuse encephalopathy, no seizures/ epileptiform discharges P:  - overnight EEG  neg with improving mental status today, hopeful for extubation - serial neuro watch - seizure precautions, stop empiric keppra  with neg EEG - supportive care as below - infectious workup as below   Acute respiratory insufficiency related to above aspiration PNA - found covered in emesis P:  - flipped to SBT, tolerated for ~42mins then continued coughing, copious oral and ETT secretions, desating, and HR in 130-140.  Will try SBT later today  - full MV support as needed, 8cc/kg IBW with goal Pplat <30 and DP<15  - VAP  prevention protocol/ PPI - PAD protocol for sedation> low dose propofol , prn fentanyl  - wean FiO2 as able for SpO2 >92% - prn bronchodilators, guaifenesin   - cont cftx for now> follow cultures to guide abx - hold TF, possible extubation later today   Sepsis, 2/2 aspiration PNA, rule out other source - UA neg on admit - trauma imaging noted hypoattenuating areas in the linear configuration in the left iliacus/iliopsoas muscles, nonspecific but can be seen with bursitis; fluid collection does not exhibit hyperattenuating  walls to suggest absces - send blood cultures, follow trach asp.  Remains hemodynamically stable, goal MAP > 65 - continue ctx for now, hold on abx escalation  - follow fever/ WBC curve    AKI on CKD3a Elevated CK AGMA/ lactic acidosis, improving - baseline sCr from December/ April 2- 2.25 Hypokalemia - no renal obstruction on trauma imaging P:  - UOP stable, awaiting am labs - wts stable from admit - trend renal indices  - strict I/Os, daily wts - avoid nephrotoxins, renal dose meds, hemodynamic support as above   Uncontrolled DMT2 w/ DKA - BHA 4 on admit P:  - CBG liable, cont mSSI prn, decrease semglee  given lower CBGs - A1c 11.9   Chronic Atrial flutter on Eliquis  - tele monitor  - HR better with lower dose of coreg  but up with SBT this am.  SBP liable overnight 90-160's, suspect more related to sedation, hold on escalating for now.  Await cardiology input - cont eliquis   - optimize electrolytes   HTN HLD Elevated trop New systolic HF - EF 19-14%, global HK, indeterminate diastolic, mildly reduced RV, suggested RAP 8.  This is down from recent TTE in April with EF 60-65% and normal RV - continue reduced dose pta coreg  for now, hold on dose adjustment given liable BP (improving off sedation).   - stop MIVF for now given EF - will consult cardiology - hold statin w/ elevated CK.  - cont to hold other pta antihypertensives    Colon Cancer of  splenic flexure - followed by Dr. [name], previous surgery in January put off given December hospitalization with plans to reschedule soon.  Will be postponed again given hospitalization.    Stage 3 pressure injury coccyx/ sacrum- POA - per WOC recommendations  Best Practice (right click and "Reselect all SmartList Selections" daily)   Diet/type: NPO; EN per RD DVT prophylaxis DOAC Pressure ulcer(s): present on admission  GI prophylaxis: PPI Lines: Central line- R femoral out 5/13 Foley:  Yes, and it is still needed Code Status:  full code Last date of multidisciplinary goals of care discussion [5/12]  Niece, [name] is HPOA (on file)> pending update 5/14.   Labs   CBC: Recent Labs  Lab 09/18/23 1037 09/18/23 1038 09/18/23 1206 09/19/23 0627  WBC  --  9.6  --  7.5  HGB 15.0 13.9 11.6* 11.6*  HCT 44.0 42.9 34.0* 34.3*  MCV  --  77.6*  --  76.2*  PLT  --  166  --  153    Basic Metabolic Panel: Recent Labs  Lab 09/18/23 1037 09/18/23 1038 09/18/23 1206 09/18/23 1547 09/19/23 0627 09/19/23 0910 09/19/23 1657  NA 139 138 136 140 142  --  143  K 3.8 4.0 3.2* 3.4* 2.6*  --  3.9  CL 103 99  --  104 109  --  108  CO2  --  23  --  20* 24  --  25  GLUCOSE 435* 437*  --  378* 72  --  120*  BUN 31* 26*  --  29* 29*  --  29*  CREATININE 2.10* 2.25*  --  2.44* 2.67*  --  2.87*  CALCIUM  --  9.5  --  9.1 8.6*  --  8.6*  MG  --   --   --   --   --  1.9  --   PHOS  --   --   --   --   --  2.1*  --    GFR: Estimated Creatinine Clearance: 23.3 mL/min (A) (by C-G formula based on SCr of 2.87 mg/dL (H)). Recent Labs  Lab 09/18/23 1036 09/18/23 1038 09/18/23 1406 09/19/23 0627  WBC  --  9.6  --  7.5  LATICACIDVEN 2.2*  --  2.2*  --     Liver Function Tests: Recent Labs  Lab 09/18/23 1038 09/19/23 0627  AST 29 38  ALT 17 21  ALKPHOS 82 56  BILITOT 1.3* 0.7  PROT 7.9 5.8*  ALBUMIN 3.4* 2.4*   No results for input(s): "LIPASE", "AMYLASE" in the last 168  hours. Recent Labs  Lab 09/18/23 1547  AMMONIA 21    ABG    Component Value Date/Time   PHART 7.488 (H) 09/18/2023 1206   PCO2ART 24.6 (L) 09/18/2023 1206   PO2ART 463 (H) 09/18/2023 1206   HCO3 18.7 (L) 09/18/2023 1206   TCO2 19 (L) 09/18/2023 1206   ACIDBASEDEF 3.0 (H) 09/18/2023 1206   O2SAT 100 09/18/2023 1206     Coagulation Profile: Recent Labs  Lab 09/18/23 1038  INR 1.3*    Cardiac Enzymes: Recent Labs  Lab 09/18/23 1038 09/19/23 0627  CKTOTAL 621* 1,841*  CKMB 17.9*  --     HbA1C: Hgb A1c MFr Bld  Date/Time Value Ref Range Status  09/19/2023 06:24 AM 11.9 (H) 4.8 - 5.6 % Final    Comment:    (NOTE)         Prediabetes: 5.7 - 6.4         Diabetes: >6.4         Glycemic control for adults with diabetes: <7.0   09/18/2023 02:06 PM 12.3 (H) 4.8 - 5.6 % Final    Comment:    (NOTE)         Prediabetes: 5.7 - 6.4         Diabetes: >6.4         Glycemic control for adults with diabetes: <7.0     CBG: Recent Labs  Lab 09/19/23 1549 09/19/23 1945 09/19/23 2327 09/20/23 0321 09/20/23 0741  GLUCAP 214* 80 135* 173* 109*    Allergies No Known Allergies   Home Medications  Prior to Admission medications   Medication Sig Start Date End Date Taking? Authorizing Provider  atorvastatin (LIPITOR) 20 MG tablet Take 20 mg by mouth daily. 06/27/23   [provider]  carvedilol  (COREG ) 25 MG tablet Take 25 mg by mouth 2 (two)  times daily. 07/24/23   [provider]  Continuous Glucose Sensor (DEXCOM G6 SENSOR) MISC Inject 1 Device into the skin See admin instructions. Change device every 10 days. 09/17/23   [provider]  folic acid (FOLVITE) 1 MG tablet Take 1 mg by mouth daily.    [provider]  hydrALAZINE  (APRESOLINE ) 25 MG tablet Take 25 mg by mouth 3 (three) times daily. 06/27/23   [provider]  INVOKANA 300 MG TABS tablet Take 300 mg by mouth daily. 09/18/23   [provider]  KERENDIA 10 MG  TABS Take 1 tablet by mouth daily. 09/18/23   [provider]  LANTUS  SOLOSTAR 100 UNIT/ML Solostar Pen Inject 45 Units into the skin at bedtime. 06/19/23   [provider]  metolazone (ZAROXOLYN) 2.5 MG tablet Take 2.5 mg by mouth every other day. 04/03/23   [provider]  NOVOLOG  100 UNIT/ML injection Inject 0-9 Units into the skin 3 (three) times daily with meals. Inject 0-9 Units into the skin 3 (three) times daily with meals.  CBG < 70: Implement Hypoglycemia Standing Orders and refer to Hypoglycemia Standing Orders sidebar report  CBG 70 - 120: 0 units  CBG 121 - 150: 1 unit  CBG 151 - 200: 2 units  CBG 201 - 250: 3 units  CBG 251 - 300: 5 units  CBG 301 - 350: 7 units  CBG 351 - 400: 9 units  CBG > 400: call MD and obtain STAT lab verification 06/09/23   [provider]  pantoprazole  (PROTONIX ) 40 MG tablet Take 40 mg by mouth 2 (two) times daily. 06/09/23   [provider]  potassium chloride  (KLOR-CON ) 10 MEQ tablet Take 20 mEq by mouth daily. 08/03/23   [provider]  spironolactone (ALDACTONE) 100 MG tablet Take 100 mg by mouth daily. 07/24/23   [provider]  tamsulosin (FLOMAX) 0.4 MG CAPS capsule Take 0.4 mg by mouth daily after supper. 06/27/23   [provider]  telmisartan (MICARDIS) 40 MG tablet Take 40 mg by mouth daily. 08/18/23   [provider]  torsemide (DEMADEX) 20 MG tablet Take 20 mg by mouth daily. 09/05/23   [provider]  Vitamin D, Ergocalciferol, (DRISDOL) 1.25 MG (50000 UNIT) CAPS capsule Take 50,000 Units by mouth once a week. 04/05/23   [provider]     Critical care time: 38 mins       Early Glisson, MSN, AG-ACNP-BC [facility] 09/20/2023, 9:14 AM  See Amion for pager If no response to pager , please call [phone] until 7pm After 7:00 pm call Elink  [phone]

## 2023-09-20 NOTE — Consult Note (Addendum)
 Cardiology Consultation   Patient ID: Cory [name] MRN: [id number]; DOB: 08/09/1945  Admit date: 09/18/2023 Date of Consult: 09/20/2023  PCP:  Janese Medicine., MD   [facility] Providers Cardiologist:  Kyra Phy, MD     Patient Profile:   Cory [name] is a 78 y.o. 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 Gaynell Keeler.     History of Present Illness:   Cory [name] is a 78 year old male with above medical history who is followed by Dr. Lorie Rook. Patient was previously admitted in 12/2022 after he presented with confusion and poor oral intake.  Found to have an AKI. While in the ED, he developed atrial flutter with intermittent RVR.  He was treated with IV fluids, IV Cardizem, IV heparin .  He underwent echocardiogram on 12/27/2022 that showed EF 60-65%, no regional wall motion abnormalities, normal RV systolic function, trivial MR. He was ultimately discharged with metoprolol  and Eliquis .  He was asymptomatic when heart rate was well-controlled so decision was made to not proceed with TEE cardioversion. All EKGs in the chart since 12/2022 have continued to show atrial flutter.   He was later seen by Dr. Lorie Rook on 08/18/23.  At that time, patient was being considered for resection and chemotherapy of his colon adenocarcinoma.  For preop evaluation, he underwent echocardiogram on 08/22/2023 that showed EF 60-65%, no regional wall motion abnormalities, normal RV systolic function, mild mitral valve regurgitation.  He also underwent nuclear stress test on 09/14/2023 that was a normal, low risk study.  Overall it was determined that patient was at a low risk for CV complications and was given the okay to proceed with surgery.  Does not appear that he  has undergone surgery yet  Patient was brought in to the ED on 5/12.  He had been found on the floor that morning covered in feces and vomit.  Found to have unequal pupils and hematoma to right forehead.  Level 1 trauma activated.  He was intubated for airway protection.  In the ED, CT head showed no evidence of an acute intracranial abnormality, left-sided scalp hematoma.  CT cervical spine showed no evidence of acute cervical spine fracture.  CT chest abdomen pelvis showed no acute traumatic injury to the chest, abdomen or pelvis, short segment irregular circumferential thickening of the proximal sigmoid colon compatible with patient's known history of sigmoid colon cancer.   Patient was admitted to the critical care service on 5/12 for further workup and treatment.  It is suspected that acute encephalopathy is due to metabolic causes. EEG showed severe diffuse encephalopathy, no seizures or epileptiform discharges.  As patient was found covered in vomit, treated for likely aspiration pneumonia with antibiotics, as needed bronchodilators.  Patient underwent echocardiogram on 09/19/2023 that showed EF 25-30% with diffuse hypokinesis, mild LVH, mildly reduced RV systolic function.  Cardiology consulted for atrial flutter and reduced EF on echocardiogram.  Initially, plan to attempt to extubate patient today.  However, patient's heart rate became elevated.  When off sedation, patient has been hypertensive.  However, when propofol  resumed his blood pressure dropped to 102/61.  Per telemetry, heart rate is in the 110s-120s and atrial flutter.  Patient remains intubated and sedated on exam.  Past Medical History:  Diagnosis Date  Atrial flutter (HCC)    BPH (benign prostatic hyperplasia)    CKD (chronic kidney disease), stage III (HCC)    Colon cancer (HCC)    DM (diabetes mellitus), type 2 (HCC)    HLD (hyperlipidemia)    HTN (hypertension)    Hx of right BKA (HCC)    Prostate CA (HCC)    Prostate CA  (HCC)     History reviewed. No pertinent surgical history.   Inpatient Medications: Scheduled Meds:  apixaban   5 mg Per Tube BID   carvedilol   6.25 mg Per Tube BID WC   Chlorhexidine  Gluconate Cloth  6 each Topical Daily   feeding supplement (PROSource TF20)  60 mL Per Tube BID   guaiFENesin   600 mg Per Tube Q12H   insulin  aspart  0-15 Units Subcutaneous Q4H   [START ON 09/21/2023] insulin  glargine-yfgn  14 Units Subcutaneous Daily   leptospermum manuka honey  1 Application Topical Daily   levETIRAcetam   500 mg Per Tube BID   nutrition supplement (JUVEN)  1 packet Per Tube BID BM   mouth rinse  15 mL Mouth Rinse Q2H   pantoprazole  (PROTONIX ) IV  40 mg Intravenous Q24H   Continuous Infusions:  cefTRIAXone  (ROCEPHIN )  IV 200 mL/hr at 09/20/23 1000   feeding supplement (OSMOLITE 1.5 CAL) Stopped (09/20/23 0915)   propofol  (DIPRIVAN ) infusion 20 mcg/kg/min (09/20/23 1000)   PRN Meds: acetaminophen  (TYLENOL ) oral liquid 160 mg/5 mL, docusate, fentaNYL  (SUBLIMAZE ) injection, fentaNYL  (SUBLIMAZE ) injection, levalbuterol , mouth rinse, polyethylene glycol  Allergies:   No Known Allergies  Social History:   Social History   Socioeconomic History   Marital status: Married    Spouse name: Not on file   Number of children: Not on file   Years of education: Not on file   Highest education level: Not on file  Occupational History   Not on file  Tobacco Use   Smoking status: Former    Types: Cigarettes   Smokeless tobacco: Never  Vaping Use   Vaping status: Never Used  Substance and Sexual Activity   Alcohol use: Not on file   Drug use: Never   Sexual activity: Not on file  Other Topics Concern   Not on file  Social History Narrative   Not on file   Social Drivers of Health   Financial Resource Strain: Not on file  Food Insecurity: No Food Insecurity (09/18/2023)   Hunger Vital Sign    Worried About Running Out of Food in the Last Year: Never true    Ran Out of Food in the  Last Year: Never true  Transportation Needs: No Transportation Needs (09/18/2023)   PRAPARE - Administrator, Civil Service (Medical): No    Lack of Transportation (Non-Medical): No  Physical Activity: Not on file  Stress: Not on file  Social Connections: Patient Unable To Answer (09/18/2023)   Social Connection and Isolation Panel [NHANES]    Frequency of Communication with Friends and Family: Patient unable to answer    Frequency of Social Gatherings with Friends and Family: Patient unable to answer    Attends Religious Services: Patient unable to answer    Active Member of Clubs or Organizations: Patient unable to answer    Attends Banker Meetings: Patient unable to answer    Marital Status: Patient unable to answer  Intimate Partner Violence: Not on file    Family History:   History reviewed. No pertinent family history.   ROS:  Please see  the history of present illness.   All other ROS reviewed and negative.     Physical Exam/Data:   Vitals:   09/20/23 0930 09/20/23 0942 09/20/23 1000 09/20/23 1030  BP: (!) 162/110 (!) 162/110 (!) 171/116 102/61  Pulse: (!) 110 (!) 140 (!) 126 (!) 121  Resp: 16  (!) 23 16  Temp:      TempSrc:      SpO2: 94%  100% 100%  Weight:      Height:        Intake/Output Summary (Last 24 hours) at 09/20/2023 1059 Last data filed at 09/20/2023 1000 Gross per 24 hour  Intake 3556.95 ml  Output 835 ml  Net 2721.95 ml      09/20/2023    3:24 AM 09/19/2023    5:00 AM 09/18/2023    1:45 PM  Last 3 Weights  Weight (lbs) 185 lb 13.6 oz 176 lb 5.9 oz 176 lb 2.4 oz  Weight (kg) 84.3 kg 80 kg 79.9 kg     Body mass index is 25.92 kg/m.  General:  elderly male, laying in the bed with head elevated. Intubated and sedated  HEENT: normal Neck: no JVD Cardiac:  normal S1, S2; Irregular rate and rhythm, tachycardic  Lungs: coarse breath sounds  Abd: soft  Ext: no edema in BLE  Musculoskeletal:  No deformities  Skin: warm and  dry  Neuro:   sedated   EKG:  The EKG was personally reviewed and demonstrates:  No EKG this admission. Ordered.  Telemetry:  Telemetry was personally reviewed and demonstrates:  atrial flutter, HR in the 110s-120s   Relevant CV Studies: Cardiac Studies & Procedures   ______________________________________________________________________________________________     ECHOCARDIOGRAM  ECHOCARDIOGRAM COMPLETE 09/19/2023  Narrative ECHOCARDIOGRAM REPORT    Patient Name:   Ivey Cina Date of Exam: 09/19/2023 Medical Rec #:  [id number]    Height:       71.0 in Accession #:    [id number]   Weight:       176.4 lb Date of Birth:  1945/11/30    BSA:          1.999 m Patient Age:    77 years     BP:           137/100 mmHg Patient Gender: M            HR:           128 bpm. Exam Location:  Inpatient  Procedure: 2D Echo, Cardiac Doppler, Color Doppler and Intracardiac Opacification Agent (Both Spectral and Color Flow Doppler were utilized during procedure).  Indications:    Acute Respiratory Distress  History:        Patient has no prior history of Echocardiogram examinations. Arrythmias:Atrial Flutter; Risk Factors:Hypertension.  Sonographer:    Willey Harrier Referring Phys: Madelynn Schilder, M  IMPRESSIONS   1. Left ventricular ejection fraction, by estimation, is 25 to 30%. The left ventricle has severely decreased function. The left ventricle demonstrates global hypokinesis. There is mild concentric left ventricular hypertrophy. Left ventricular diastolic parameters are indeterminate. 2. Right ventricular systolic function is mildly reduced. The right ventricular size is normal. 3. The mitral valve is normal in structure. No evidence of mitral valve regurgitation. No evidence of mitral stenosis. 4. The aortic valve was not well visualized. Aortic valve regurgitation is not visualized. No aortic stenosis is present. 5. The inferior vena cava is normal in size with <50% respiratory  variability, suggesting right atrial pressure of 8 mmHg.  Comparison(s): No prior Echocardiogram.  FINDINGS Left Ventricle: Left ventricular ejection fraction, by estimation, is 25 to 30%. The left ventricle has severely decreased function. The left ventricle demonstrates global hypokinesis. Definity  contrast agent was given IV to delineate the left ventricular endocardial borders. Strain was performed and the global longitudinal strain is indeterminate. The left ventricular internal cavity size was normal in size. There is mild concentric left ventricular hypertrophy. Left ventricular diastolic parameters are indeterminate.  Right Ventricle: The right ventricular size is normal. No increase in right ventricular wall thickness. Right ventricular systolic function is mildly reduced.  Left Atrium: Left atrial size was normal in size.  Right Atrium: Right atrial size was normal in size.  Pericardium: There is no evidence of pericardial effusion.  Mitral Valve: The mitral valve is normal in structure. No evidence of mitral valve regurgitation. No evidence of mitral valve stenosis. MV peak gradient, 3.7 mmHg. The mean mitral valve gradient is 2.0 mmHg.  Tricuspid Valve: The tricuspid valve is normal in structure. Tricuspid valve regurgitation is not demonstrated. No evidence of tricuspid stenosis.  Aortic Valve: The aortic valve was not well visualized. Aortic valve regurgitation is not visualized. No aortic stenosis is present. Aortic valve peak gradient measures 4.6 mmHg.  Pulmonic Valve: The pulmonic valve was normal in structure. Pulmonic valve regurgitation is not visualized. No evidence of pulmonic stenosis.  Aorta: The aortic root and ascending aorta are structurally normal, with no evidence of dilitation.  Venous: The inferior vena cava is normal in size with less than 50% respiratory variability, suggesting right atrial pressure of 8 mmHg.  IAS/Shunts: There is right bowing of the  interatrial septum, suggestive of elevated left atrial pressure. The atrial septum is grossly normal.  Additional Comments: 3D was performed not requiring image post processing on an independent workstation and was indeterminate.   LEFT VENTRICLE PLAX 2D LVIDd:         4.10 cm LVIDs:         3.60 cm LV PW:         1.20 cm LV IVS:        1.20 cm LVOT diam:     2.20 cm LV SV:         36 LV SV Index:   18 LVOT Area:     3.80 cm   RIGHT VENTRICLE          IVC RV Basal diam:  3.70 cm  IVC diam: 1.70 cm  LEFT ATRIUM             Index        RIGHT ATRIUM           Index LA Vol (A2C):   35.9 ml 17.96 ml/m  RA Area:     14.40 cm LA Vol (A4C):   24.7 ml 12.36 ml/m  RA Volume:   32.40 ml  16.21 ml/m LA Biplane Vol: 32.4 ml 16.21 ml/m AORTIC VALVE AV Area (Vmax): 2.78 cm AV Vmax:        107.50 cm/s AV Peak Grad:   4.6 mmHg LVOT Vmax:      78.73 cm/s LVOT Vmean:     49.933 cm/s LVOT VTI:       0.096 m  AORTA Ao Root diam: 3.30 cm Ao Asc diam:  3.20 cm  MITRAL VALVE MV Area (PHT): 3.84 cm    SHUNTS MV Area VTI:   3.40 cm    Systemic VTI:  0.10 m MV Peak grad:  3.7  mmHg    Systemic Diam: 2.20 cm MV Mean grad:  2.0 mmHg MV Vmax:       0.96 m/s MV Vmean:      67.3 cm/s MV Decel Time: 198 msec MV E velocity: 92.03 cm/s  Gloriann Larger MD Electronically signed by Gloriann Larger MD Signature Date/Time: 09/19/2023/6:21:12 PM    Final          ______________________________________________________________________________________________       Laboratory Data:  High Sensitivity Troponin:   Recent Labs  Lab 09/18/23 1547 09/19/23 0910 09/19/23 1754  TROPONINIHS 115* 158* 135*     Chemistry Recent Labs  Lab 09/19/23 0627 09/19/23 0910 09/19/23 1657 09/20/23 0925  NA 142  --  143 140  K 2.6*  --  3.9 3.4*  CL 109  --  108 109  CO2 24  --  25 22  GLUCOSE 72  --  120* 143*  BUN 29*  --  29* 40*  CREATININE 2.67*  --  2.87* 2.77*  CALCIUM  8.6*  --  8.6* 8.5*  MG  --  1.9  --  2.2  GFRNONAA 24*  --  22* 23*  ANIONGAP 9  --  10 9    Recent Labs  Lab 09/18/23 1038 09/19/23 0627  PROT 7.9 5.8*  ALBUMIN 3.4* 2.4*  AST 29 38  ALT 17 21  ALKPHOS 82 56  BILITOT 1.3* 0.7   Lipids  Recent Labs  Lab 09/19/23 0627  TRIG 110    Hematology Recent Labs  Lab 09/18/23 1038 09/18/23 1206 09/19/23 0627 09/20/23 0925  WBC 9.6  --  7.5 7.8  RBC 5.53  --  4.50 4.27  HGB 13.9 11.6* 11.6* 11.0*  HCT 42.9 34.0* 34.3* 33.9*  MCV 77.6*  --  76.2* 79.4*  MCH 25.1*  --  25.8* 25.8*  MCHC 32.4  --  33.8 32.4  RDW 14.4  --  14.5 15.2  PLT 166  --  153 140*   Thyroid No results for input(s): "TSH", "FREET4" in the last 168 hours.  BNPNo results for input(s): "BNP", "PROBNP" in the last 168 hours.  DDimer No results for input(s): "DDIMER" in the last 168 hours.   Radiology/Studies:  Overnight EEG with video Result Date: 09/20/2023 Arleene Lack, MD     09/20/2023  8:05 AM Patient Name: Jaques Mineer MRN: [id number] Epilepsy Attending: Arleene Lack Referring Physician/Provider: Imogene Mana, NP Duration: 09/19/2023 1917 to 09/20/2023 0730 Patient history: 78yo M with h/o seizures came with fall. EEG to evaluate for seizure  Level of alertness: comatose  AEDs during EEG study: propofol , LEV  Technical aspects: This EEG study was done with scalp electrodes positioned according to the 10-20 International system of electrode placement. Electrical activity was reviewed with band pass filter of 1-70Hz , sensitivity of 7 uV/mm, display speed of 16mm/sec with a 60Hz  notched filter applied as appropriate. EEG data were recorded continuously and digitally stored.  Video monitoring was available and reviewed as appropriate.  Description: EEG showed continuous generalized 3-5hz  theta- delta slowing. Hyperventilation and photic stimulation were not performed.    ABNORMALITY - Continuous slow, generalized  IMPRESSION: This study is suggestive of  severe diffuse encephalopathy. No seizures or epileptiform discharges were seen throughout the recording.   Priyanka Suzanne Erps    US  RENAL Result Date: 09/19/2023 CLINICAL DATA:  Acute kidney injury EXAM: RENAL / URINARY TRACT ULTRASOUND COMPLETE COMPARISON:  CT 09/18/2023 FINDINGS: Right Kidney: Renal measurements: 12 x 4.1 x 4.4  cm = volume: 112.5 mL. Patchy areas of increased echogenicity. No mass or hydronephrosis Left Kidney: Renal measurements: 9.6 x 4.6 x 3.8 cm = volume: 87.8 mL. Patchy areas of increased cortical echogenicity. No mass or hydronephrosis Bladder: Decompressed by Foley catheter Other: None. IMPRESSION: No hydronephrosis. Patchy areas of increased cortical echogenicity bilaterally consistent with medical renal disease. Electronically Signed   By: Esmeralda Hedge M.D.   On: 09/19/2023 19:02   ECHOCARDIOGRAM COMPLETE Result Date: 09/19/2023    ECHOCARDIOGRAM REPORT   Patient Name:   Erwin Nishiyama Date of Exam: 09/19/2023 Medical Rec #:  [id number]    Height:       71.0 in Accession #:    [id number]   Weight:       176.4 lb Date of Birth:  [birth year]    BSA:          1.999 m Patient Age:    77 years     BP:           137/100 mmHg Patient Gender: M            HR:           128 bpm. Exam Location:  Inpatient Procedure: 2D Echo, Cardiac Doppler, Color Doppler and Intracardiac            Opacification Agent (Both Spectral and Color Flow Doppler were            utilized during procedure). Indications:    Acute Respiratory Distress  History:        Patient has no prior history of Echocardiogram examinations.                 Arrythmias:Atrial Flutter; Risk Factors:Hypertension.  Sonographer:    Willey Harrier Referring Phys: Madelynn Schilder, M IMPRESSIONS  1. Left ventricular ejection fraction, by estimation, is 25 to 30%. The left ventricle has severely decreased function. The left ventricle demonstrates global hypokinesis. There is mild concentric left ventricular hypertrophy. Left ventricular diastolic   parameters are indeterminate.  2. Right ventricular systolic function is mildly reduced. The right ventricular size is normal.  3. The mitral valve is normal in structure. No evidence of mitral valve regurgitation. No evidence of mitral stenosis.  4. The aortic valve was not well visualized. Aortic valve regurgitation is not visualized. No aortic stenosis is present.  5. The inferior vena cava is normal in size with <50% respiratory variability, suggesting right atrial pressure of 8 mmHg. Comparison(s): No prior Echocardiogram. FINDINGS  Left Ventricle: Left ventricular ejection fraction, by estimation, is 25 to 30%. The left ventricle has severely decreased function. The left ventricle demonstrates global hypokinesis. Definity  contrast agent was given IV to delineate the left ventricular endocardial borders. Strain was performed and the global longitudinal strain is indeterminate. The left ventricular internal cavity size was normal in size. There is mild concentric left ventricular hypertrophy. Left ventricular diastolic parameters are indeterminate. Right Ventricle: The right ventricular size is normal. No increase in right ventricular wall thickness. Right ventricular systolic function is mildly reduced. Left Atrium: Left atrial size was normal in size. Right Atrium: Right atrial size was normal in size. Pericardium: There is no evidence of pericardial effusion. Mitral Valve: The mitral valve is normal in structure. No evidence of mitral valve regurgitation. No evidence of mitral valve stenosis. MV peak gradient, 3.7 mmHg. The mean mitral valve gradient is 2.0 mmHg. Tricuspid Valve: The tricuspid valve is normal in structure. Tricuspid valve regurgitation is not demonstrated. No evidence of  tricuspid stenosis. Aortic Valve: The aortic valve was not well visualized. Aortic valve regurgitation is not visualized. No aortic stenosis is present. Aortic valve peak gradient measures 4.6 mmHg. Pulmonic Valve: The  pulmonic valve was normal in structure. Pulmonic valve regurgitation is not visualized. No evidence of pulmonic stenosis. Aorta: The aortic root and ascending aorta are structurally normal, with no evidence of dilitation. Venous: The inferior vena cava is normal in size with less than 50% respiratory variability, suggesting right atrial pressure of 8 mmHg. IAS/Shunts: There is right bowing of the interatrial septum, suggestive of elevated left atrial pressure. The atrial septum is grossly normal. Additional Comments: 3D was performed not requiring image post processing on an independent workstation and was indeterminate.  LEFT VENTRICLE PLAX 2D LVIDd:         4.10 cm LVIDs:         3.60 cm LV PW:         1.20 cm LV IVS:        1.20 cm LVOT diam:     2.20 cm LV SV:         36 LV SV Index:   18 LVOT Area:     3.80 cm  RIGHT VENTRICLE          IVC RV Basal diam:  3.70 cm  IVC diam: 1.70 cm LEFT ATRIUM             Index        RIGHT ATRIUM           Index LA Vol (A2C):   35.9 ml 17.96 ml/m  RA Area:     14.40 cm LA Vol (A4C):   24.7 ml 12.36 ml/m  RA Volume:   32.40 ml  16.21 ml/m LA Biplane Vol: 32.4 ml 16.21 ml/m  AORTIC VALVE AV Area (Vmax): 2.78 cm AV Vmax:        107.50 cm/s AV Peak Grad:   4.6 mmHg LVOT Vmax:      78.73 cm/s LVOT Vmean:     49.933 cm/s LVOT VTI:       0.096 m  AORTA Ao Root diam: 3.30 cm Ao Asc diam:  3.20 cm MITRAL VALVE MV Area (PHT): 3.84 cm    SHUNTS MV Area VTI:   3.40 cm    Systemic VTI:  0.10 m MV Peak grad:  3.7 mmHg    Systemic Diam: 2.20 cm MV Mean grad:  2.0 mmHg MV Vmax:       0.96 m/s MV Vmean:      67.3 cm/s MV Decel Time: 198 msec MV E velocity: 92.03 cm/s Gloriann Larger MD Electronically signed by Gloriann Larger MD Signature Date/Time: 09/19/2023/6:21:12 PM    Final    DG Abd Portable 1V Result Date: 09/18/2023 CLINICAL DATA:  OG tube placement EXAM: PORTABLE ABDOMEN - 1 VIEW COMPARISON:  05/09/2023 FINDINGS: Enteric tube tip is looped within the proximal  stomach, tip position beneath the left hemidiaphragm in the gastric cardia region. Nonobstructed gas pattern. Right femoral catheter with tip overlying the right aspect of L5. Excreted contrast in the renal collecting systems. IMPRESSION: Enteric tube tip looped within the proximal stomach, tip in the gastric cardia region. Electronically Signed   By: Esmeralda Hedge M.D.   On: 09/18/2023 19:16   EEG adult Result Date: 09/18/2023 Arleene Lack, MD     09/18/2023  6:25 PM Patient Name: Weldon Nouri MRN: [id number] Epilepsy Attending: Arleene Lack Referring Physician/Provider: Pinkey Brier,  NP Date: 09/18/2023 Duration: 23.56 mins Patient history: 78yo M with h/o seizures came with fall. EEG to evaluate for seizure Level of alertness: comatose AEDs during EEG study: propofol  Technical aspects: This EEG study was done with scalp electrodes positioned according to the 10-20 International system of electrode placement. Electrical activity was reviewed with band pass filter of 1-70Hz , sensitivity of 7 uV/mm, display speed of 20mm/sec with a 60Hz  notched filter applied as appropriate. EEG data were recorded continuously and digitally stored.  Video monitoring was available and reviewed as appropriate. Description: EEG showed continuous generalized 2-3 Hz delta slowing. Hyperventilation and photic stimulation were not performed.   ABNORMALITY - Continuous slow, generalized IMPRESSION: This study is suggestive of severe diffuse encephalopathy. No seizures or epileptiform discharges were seen throughout the recording. Arleene Lack   DG Chest Portable 1 View Result Date: 09/18/2023 CLINICAL DATA:  Post intubation EXAM: PORTABLE CHEST 1 VIEW COMPARISON:  Sep 18, 2023 FINDINGS: The heart size and mediastinal contours are within normal limits. Both lungs are clear. The visualized skeletal structures are unremarkable. Comparison with prior examination there is no change in the position of the endotracheal tube the  tip of which remains 2 cm from carina. The nasogastric tube remains in anatomic position the tip in the stomach. IMPRESSION: *No acute cardiopulmonary process. *Endotracheal tube and nasogastric tube in anatomic position. Electronically Signed   By: Fredrich Jefferson M.D.   On: 09/18/2023 11:47   CT CHEST ABDOMEN PELVIS W CONTRAST Addendum Date: 09/18/2023 ADDENDUM REPORT: 09/18/2023 11:44 ADDENDUM: Critical Value/emergent results were called by telephone at the time of interpretation on 09/18/2023 at 11:30 am to Dr. Aniceto Barley, who verbally acknowledged these results. Electronically Signed   By: Beula Brunswick M.D.   On: 09/18/2023 11:44   Result Date: 09/18/2023 CLINICAL DATA:  Polytrauma, blunt. Unwitnessed fall. History of sigmoid colon cancer. * Tracking Code: BO * EXAM: CT CHEST, ABDOMEN, AND PELVIS WITH CONTRAST TECHNIQUE: Multidetector CT imaging of the chest, abdomen and pelvis was performed following the standard protocol during bolus administration of intravenous contrast. RADIATION DOSE REDUCTION: This exam was performed according to the departmental dose-optimization program which includes automated exposure control, adjustment of the mA and/or kV according to patient size and/or use of iterative reconstruction technique. CONTRAST:  75mL OMNIPAQUE  IOHEXOL  350 MG/ML SOLN COMPARISON:  PET-CT scan from 04/19/2023. FINDINGS: CT CHEST FINDINGS Cardiovascular: Normal cardiac size. No pericardial effusion. No aortic aneurysm. Mediastinum/Nodes: Visualized thyroid gland appears grossly unremarkable. No solid / cystic mediastinal masses. The esophagus is nondistended precluding optimal assessment. An enteric tube noted with its tip in the cardia/fundus of the stomach. No axillary, mediastinal or hilar lymphadenopathy by size criteria. Lungs/Pleura: The central tracheo-bronchial tree is patent. There are dependent changes in bilateral lungs. No mass or consolidation. No pleural effusion or pneumothorax. No suspicious  lung nodules. Musculoskeletal: The visualized soft tissues of the chest wall are grossly unremarkable. No suspicious osseous lesions. There are mild to moderate multilevel degenerative changes in the visualized spine. CT ABDOMEN PELVIS FINDINGS Hepatobiliary: The liver is normal in size. Non-cirrhotic configuration. No suspicious mass. These is mild diffuse hepatic steatosis. No intrahepatic or extrahepatic bile duct dilation. No calcified gallstones. Normal gallbladder wall thickness. No pericholecystic inflammatory changes. Pancreas: Unremarkable. No pancreatic ductal dilatation or surrounding inflammatory changes. Spleen: Within normal limits. No focal lesion. Adrenals/Urinary Tract: Adrenal glands are unremarkable. Persistent fetal lobulations noted in bilateral kidneys. No suspicious mass. No hydroureteronephrosis or nephroureterolithiasis. Unremarkable urinary bladder. Stomach/Bowel: No disproportionate dilation of  the small or large bowel loops. The appendix was not visualized; however there is no acute inflammatory process in the right lower quadrant. 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 Vascular/Lymphatic: No ascites or pneumoperitoneum. No abdominal or pelvic lymphadenopathy, by size criteria. No aneurysmal dilation of the major abdominal arteries. There are mild peripheral atherosclerotic vascular calcifications of the aorta and its major branches. Reproductive: Normal size prostate. Symmetric seminal vesicles. Other: There are small fat containing umbilical and left inguinal hernias. There are hypoattenuating areas in the linear configuration in the left iliacus/iliopsoas muscles, nonspecific but can be seen with bursitis. The fluid collection does not exhibit hyperattenuating walls to suggest abscess.  Correlate clinically to determine the need for additional imaging. The soft tissues and abdominal wall are otherwise unremarkable. Musculoskeletal: No suspicious osseous lesions. There are mild - moderate multilevel degenerative changes in the visualized spine. IMPRESSION: 1. No acute traumatic injury to the chest, abdomen or pelvis. 2. 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 3. Hypoattenuating areas in the left iliacus/iliopsoas muscle, as described above. 4. Multiple other nonacute observations, as described above. Electronically Signed: By: Beula Brunswick M.D. On: 09/18/2023 11:15   CT HEAD WO CONTRAST Result Date: 09/18/2023 CLINICAL DATA:  Provided history: Head trauma, moderate/severe. Polytrauma, blunt. EXAM: CT HEAD WITHOUT CONTRAST CT CERVICAL SPINE WITHOUT CONTRAST TECHNIQUE: Multidetector CT imaging of the head and cervical spine was performed following the standard protocol without intravenous contrast. Multiplanar CT image reconstructions of the cervical spine were also generated. RADIATION DOSE REDUCTION: This exam was performed according to the departmental dose-optimization program which includes automated exposure control, adjustment of the mA and/or kV according to patient size and/or use of iterative reconstruction technique. COMPARISON:  Brain MRI 05/09/2023. Head CT 05/09/2023. Cervical spine CT 05/09/2023. FINDINGS: CT HEAD FINDINGS Brain: Generalized cerebral atrophy. Small chronic cortical/subcortical infarcts again noted within the right frontal lobe. Background mild patchy and ill-defined hypoattenuation within the cerebral white matter, nonspecific but compatible with chronic small vessel ischemic diseaase. Small chronic infarcts within the thalami. There is no acute intracranial hemorrhage. No acute demarcated cortical infarct. No extra-axial fluid collection.  No evidence of an intracranial mass. No midline shift. Vascular: No hyperdense vessel.  Atherosclerotic calcifications. Skull: No calvarial fracture or aggressive osseous lesion. Sinuses/Orbits: No mass or acute finding within the imaged orbits. No significant paranasal sinus disease. Other: Left frontoparietal and temporal scalp hematoma. CT CERVICAL SPINE FINDINGS Alignment: Nonspecific straightening of the expected cervical lordosis. Slight C7-T1 grade 1 anterolisthesis. Skull base and vertebrae: The basion-dental and atlanto-dental intervals are maintained.No evidence of acute fracture to the cervical spine. Soft tissues and spinal canal: No prevertebral fluid or swelling. No visible canal hematoma. Disc levels: Cervical spondylosis with multilevel disc space narrowing, disc bulges/central disc protrusions, endplate osteophytes, uncovertebral hypertrophy and facet arthropathy. Bridging ventral osteophytes at C2-C3, C3-C4, C4-C5, C5-C6 and C7-T1. Osseous fusion present elsewhere across the disc spaces at C3-C4, C4-C5 and C5-C6. No appreciable high-grade spinal canal stenosis. Mild bony neural foraminal narrowing on the right at C6-C7 due to uncovertebral hypertrophy. Upper chest: No consolidation within the imaged lung apices. No visible pneumothorax. Other: Partially imaged support tubes. No evidence of an acute intracranial abnormality. No acute cervical spine fracture. These results were called by telephone at  the time of interpretation on 09/18/2023 at 11:22 am to provider PA Cherlyn Cornet, who verbally acknowledged these results. IMPRESSION: 1. No evidence of an acute intracranial abnormality. 2. Left-sided scalp hematoma. 3. 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 CT cervical spine: 1. No evidence of an acute cervical spine fracture. 2. Nonspecific straightening of the expected cervical lordosis. 3. Mild C7-T1 grade 1 anterolisthesis. 4. Cervical  spondylosis and multilevel vertebral ankylosis, as described. Electronically Signed   By: Bascom Lily D.O.   On: 09/18/2023 11:23   CT CERVICAL SPINE WO CONTRAST Result Date: 09/18/2023 CLINICAL DATA:  Provided history: Head trauma, moderate/severe. Polytrauma, blunt. EXAM: CT HEAD WITHOUT CONTRAST CT CERVICAL SPINE WITHOUT CONTRAST TECHNIQUE: Multidetector CT imaging of the head and cervical spine was performed following the standard protocol without intravenous contrast. Multiplanar CT image reconstructions of the cervical spine were also generated. RADIATION DOSE REDUCTION: This exam was performed according to the departmental dose-optimization program which includes automated exposure control, adjustment of the mA and/or kV according to patient size and/or use of iterative reconstruction technique. COMPARISON:  Brain MRI 05/09/2023. Head CT 05/09/2023. Cervical spine CT 05/09/2023. FINDINGS: CT HEAD FINDINGS Brain: Generalized cerebral atrophy. Small chronic cortical/subcortical infarcts again noted within the right frontal lobe. Background mild patchy and ill-defined hypoattenuation within the cerebral white matter, nonspecific but compatible with chronic small vessel ischemic diseaase. Small chronic infarcts within the thalami. There is no acute intracranial hemorrhage. No acute demarcated cortical infarct. No extra-axial fluid collection. No evidence of an intracranial mass. No midline shift. Vascular: No hyperdense vessel.  Atherosclerotic calcifications. Skull: No calvarial fracture or aggressive osseous lesion. Sinuses/Orbits: No mass or acute finding within the imaged orbits. No significant paranasal sinus disease. Other: Left frontoparietal and temporal scalp hematoma. CT CERVICAL SPINE FINDINGS Alignment: Nonspecific straightening of the expected cervical lordosis. Slight C7-T1 grade 1 anterolisthesis. Skull base and vertebrae: The basion-dental and atlanto-dental intervals are maintained.No  evidence of acute fracture to the cervical spine. Soft tissues and spinal canal: No prevertebral fluid or swelling. No visible canal hematoma. Disc levels: Cervical spondylosis with multilevel disc space narrowing, disc bulges/central disc protrusions, endplate osteophytes, uncovertebral hypertrophy and facet arthropathy. Bridging ventral osteophytes at C2-C3, C3-C4, C4-C5, C5-C6 and C7-T1. Osseous fusion present elsewhere across the disc spaces at C3-C4, C4-C5 and C5-C6. No appreciable high-grade spinal canal stenosis. Mild bony neural foraminal narrowing on the right at C6-C7 due to uncovertebral hypertrophy. Upper chest: No consolidation within the imaged lung apices. No visible pneumothorax. Other: Partially imaged support tubes. No evidence of an acute intracranial abnormality. No acute cervical spine fracture. These results were called by telephone at the time of interpretation on 09/18/2023 at 11:22 am to provider PA Cherlyn Cornet, who verbally acknowledged these results. IMPRESSION: 1. No evidence of an acute intracranial abnormality. 2. Left-sided scalp hematoma. 3. 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 CT cervical spine: 1. No evidence of an acute cervical spine fracture. 2. Nonspecific straightening of the expected cervical lordosis. 3. Mild C7-T1 grade 1 anterolisthesis. 4. Cervical spondylosis and multilevel vertebral ankylosis, as described. Electronically Signed   By: Bascom Lily D.O.   On: 09/18/2023 11:23   DG Pelvis Portable Result Date: 09/18/2023 CLINICAL DATA:  Trauma. EXAM: PORTABLE PELVIS 1-2 VIEWS COMPARISON:  None Available. FINDINGS: Pelvis is intact with normal and symmetric sacroiliac joints. No acute fracture or dislocation. No aggressive osseous lesion. Visualized sacral arcuate lines are unremarkable. Unremarkable symphysis pubis.  There are mild degenerative changes of bilateral hip joints without significant joint space  narrowing. Osteophytosis of the superior acetabulum. No radiopaque foreign bodies. IMPRESSION: *No acute osseous abnormality of the pelvis. Electronically Signed   By: Beula Brunswick M.D.   On: 09/18/2023 11:17   DG Chest Port 1 View Result Date: 09/18/2023 CLINICAL DATA:  Trauma. EXAM: PORTABLE CHEST 1 VIEW COMPARISON:  05/11/2023. FINDINGS: Low lung volume. Bilateral lung fields are clear. Bilateral costophrenic angles are clear. Normal cardio-mediastinal silhouette. No acute osseous abnormalities. The soft tissues are within normal limits. Endotracheal tube is seen with its tip approximately 2-2.5 cm above the carina. Enteric tube is seen coursing below the left hemidiaphragm, curling within the proximal stomach with its tip and side hole overlying the fundal region of the stomach. IMPRESSION: No active disease. Support apparatus, as described above. Electronically Signed   By: Beula Brunswick M.D.   On: 09/18/2023 11:00     Assessment and Plan:   Acute systolic heart failure - Previously echocardiogram in 08/2023 showed EF 60-65%, no regional wall motion abnormalities, normal RV systolic function, mild MR.  Nuclear stress test in 09/2022 was a normal low risk study - Patient currently admitted with the hospital after being found on the floor after an unwitnessed fall.  Being treated for acute metabolic encephalopathy, aspiration pneumonia, sepsis, AKI - Echocardiogram this admission showed EF 25-30%, mild LVH, mildly reduced RV systolic function - High-sensitivity troponin 115, 158, 135.  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 No indication for urgent cath  - BP has been labile.  Suspected related to sedation.  Once BP stabilizes, plan to add and titrate GDMT - Currently on carvedilol  6.25 mg  twice daily. Transitioning to metoprolol  for afib as below   Persistent atrial flutter - First diagnosed in 12/2022.  All EKGs since then have shown rate controlled atrial flutter - Overall, heart rate has been well-controlled this admission.  Today, there are plans to attempt extubation.  Heart rate increased to 110s-120s  - Currently on carvedilol  6.25 mg twice daily -Blood pressure has been labile.  When off propofol , patient was hypertensive with BP as high as 171/116.  He has since been started back on propofol , BP 102/61 -Stop carvedilol  6.25.  Start metoprolol  tartrate 12.5 mg every 6 hours - Would prefer to avoid amiodarone as it is likely patient missed doses of eliquis . May need if BP remains low while sedated  - Creatinine 2.77, not ideal candidate for digoxin  - Continue Eliquis  5 mg twice daily  Hyperlipidemia - Lipid panel from 08/2023 showed LDL 65, HDL 55, triglycerides 191, total cholesterol 151 - Holding home statin with elevated CK  Otherwise per primary - Acute metabolic encephalopathy - Unwitnessed fall with left scalp hematoma - Aspiration pneumonia - Sepsis likely related to aspiration pneumonia-CCM working to rule out any other sources - AKI on CKD stage IIIa, elevated CK, lactic acidosis - Uncontrolled type 2 diabetes with DKA - Colon cancer - Stage III pressure injury    Risk Assessment/Risk Scores:     [place]  Heart Association (NYHA) Functional Class NYHA Class I  CHA2DS2-VASc Score = 6   This indicates a 9.7% annual risk of stroke. The patient's score is based upon: CHF History: 1 HTN History: 1 Diabetes History: 1 Stroke History: 0 Vascular  Disease History: 1 Age Score: 2 Gender Score: 0     For questions or updates, please contact [facility] Please consult [email or web address] for contact info under    Signed, Debria Fang, PA-C  09/20/2023 10:59 AM   ATTENDING ATTESTATION:  After conducting a review of all available  clinical information with the care team, interviewing the patient, and performing a physical exam, I agree with the findings and plan described in this note.   GEN: Intubated sedated HEENT: Intubated Cardiac: RRR, no murmurs, rubs, or gallops.  Respiratory: Anterior lung fields coarse GI: Soft, nontender, non-distended  MS: No edema; No deformity. Neuro:  Nonfocal  Vasc:  +2 radial pulses; status post right BKA; feels warm and well-perfused  Patient is a 77 year old male with a history of atrial flutter on Eliquis , type 2 diabetes, hypertension, hyperlipidemia, and colon cancer who presents to [facility] after trauma.  The patient was found down.  CT showed no intracranial hemorrhage but did show a left scalp hematoma.  Other imaging showed no acute traumatic injuries.  An echocardiogram demonstrated an ejection fraction of 25 to 30% with diffuse hypokinesis representing a change from his previous echocardiogram from April which showed preserved LV function.  Tracheal aspirates positive for gram-positive cocci.  He has been treated with antibiotics.  There was attempt at extubation today which was unsuccessful.  Cardiology is consulted regarding new cardiomyopathy and atrial flutter.  Agree with starting metoprolol  tartrate 12.5 mg every 6 hours for improved rate control.  I suspect that the patient's cardiomyopathy is likely due to to an ICU or septic cardiomyopathy.  I do not think the patient is in cardiogenic shock.  Certainly the patient fails to extubate or other signs and symptoms are suggestive of this and I think placing a PICC line and obtaining a co-ox may be indicated.  Will hold on goal-directed medical therapy at this point in time.  Hopefully metoprolol  will help his rate and BP around the time of extubation.  Alyssa Backbone, MD Pager [phone]

## 2023-09-20 NOTE — Progress Notes (Signed)
 eLink Physician-Brief Progress Note Patient Name: Cory [name] DOB: [birth year] MRN: [id number]   Date of Service  09/20/2023  HPI/Events of Note  Patient failed swallow evaluation tonight.  eICU Interventions  Oral medications held overnight pending re-evaluation by [facility] attending physician in the morning, Keppra  and Lopressor  ordered iv overnight.        [name] U Libia Fazzini 09/20/2023, 9:37 PM

## 2023-09-21 ENCOUNTER — Inpatient Hospital Stay (HOSPITAL_COMMUNITY)

## 2023-09-21 ENCOUNTER — Telehealth: Payer: Self-pay | Admitting: Pulmonary Disease

## 2023-09-21 ENCOUNTER — Encounter: Payer: Self-pay | Admitting: Pulmonary Disease

## 2023-09-21 ENCOUNTER — Other Ambulatory Visit: Payer: Self-pay | Admitting: Pulmonary Disease

## 2023-09-21 ENCOUNTER — Telehealth: Payer: Self-pay

## 2023-09-21 DIAGNOSIS — N179 Acute kidney failure, unspecified: Secondary | ICD-10-CM | POA: Diagnosis not present

## 2023-09-21 DIAGNOSIS — R569 Unspecified convulsions: Secondary | ICD-10-CM | POA: Diagnosis not present

## 2023-09-21 DIAGNOSIS — J9601 Acute respiratory failure with hypoxia: Secondary | ICD-10-CM | POA: Diagnosis not present

## 2023-09-21 DIAGNOSIS — N1832 Chronic kidney disease, stage 3b: Secondary | ICD-10-CM

## 2023-09-21 DIAGNOSIS — E8729 Other acidosis: Secondary | ICD-10-CM | POA: Diagnosis not present

## 2023-09-21 DIAGNOSIS — I501 Left ventricular failure: Secondary | ICD-10-CM | POA: Insufficient documentation

## 2023-09-21 DIAGNOSIS — I5021 Acute systolic (congestive) heart failure: Secondary | ICD-10-CM | POA: Diagnosis not present

## 2023-09-21 DIAGNOSIS — L8990 Pressure ulcer of unspecified site, unspecified stage: Secondary | ICD-10-CM | POA: Insufficient documentation

## 2023-09-21 DIAGNOSIS — G9340 Encephalopathy, unspecified: Secondary | ICD-10-CM | POA: Diagnosis not present

## 2023-09-21 DIAGNOSIS — E785 Hyperlipidemia, unspecified: Secondary | ICD-10-CM | POA: Diagnosis not present

## 2023-09-21 DIAGNOSIS — D509 Iron deficiency anemia, unspecified: Secondary | ICD-10-CM | POA: Insufficient documentation

## 2023-09-21 DIAGNOSIS — I4892 Unspecified atrial flutter: Secondary | ICD-10-CM | POA: Diagnosis not present

## 2023-09-21 LAB — BASIC METABOLIC PANEL WITH GFR
Anion gap: 10 (ref 5–15)
Anion gap: 10 (ref 5–15)
BUN: 48 mg/dL — ABNORMAL HIGH (ref 8–23)
BUN: 46 mg/dL — ABNORMAL HIGH (ref 8–23)
CO2: 24 mmol/L (ref 22–32)
CO2: 24 mmol/L (ref 22–32)
Calcium: 8.3 mg/dL — ABNORMAL LOW (ref 8.9–10.3)
Calcium: 8.7 mg/dL — ABNORMAL LOW (ref 8.9–10.3)
Chloride: 110 mmol/L (ref 98–111)
Chloride: 111 mmol/L (ref 98–111)
Creatinine, Ser: 2.71 mg/dL — ABNORMAL HIGH (ref 0.61–1.24)
Creatinine, Ser: 2.69 mg/dL — ABNORMAL HIGH (ref 0.61–1.24)
GFR, Estimated: 24 mL/min — ABNORMAL LOW (ref >60)
GFR, Estimated: 24 mL/min — ABNORMAL LOW (ref >60)
Glucose, Bld: 106 mg/dL — ABNORMAL HIGH (ref 70–99)
Glucose, Bld: 389 mg/dL — ABNORMAL HIGH (ref 70–99)
Potassium: 3.6 mmol/L (ref 3.5–5.1)
Potassium: 4.2 mmol/L (ref 3.5–5.1)
Sodium: 144 mmol/L (ref 135–145)
Sodium: 145 mmol/L (ref 135–145)

## 2023-09-21 LAB — FERRITIN: Ferritin: 62 ng/mL (ref 24–336)

## 2023-09-21 LAB — CBC
HCT: 30.5 % — ABNORMAL LOW (ref 39.0–52.0)
Hemoglobin: 10.2 g/dL — ABNORMAL LOW (ref 13.0–17.0)
MCH: 26.2 pg (ref 26.0–34.0)
MCHC: 33.4 g/dL (ref 30.0–36.0)
MCV: 78.4 fL — ABNORMAL LOW (ref 80.0–100.0)
Platelets: 132 10*3/uL — ABNORMAL LOW (ref 150–400)
RBC: 3.89 MIL/uL — ABNORMAL LOW (ref 4.22–5.81)
RDW: 15.2 % (ref 11.5–15.5)
WBC: 7.4 10*3/uL (ref 4.0–10.5)
nRBC: 0 % (ref 0.0–0.2)

## 2023-09-21 LAB — IRON AND TIBC
Iron: 11 ug/dL — ABNORMAL LOW (ref 45–182)
Saturation Ratios: 5 % — ABNORMAL LOW (ref 17.9–39.5)
TIBC: 209 ug/dL — ABNORMAL LOW (ref 250–450)
UIBC: 198 ug/dL

## 2023-09-21 LAB — MAGNESIUM: Magnesium: 2.3 mg/dL (ref 1.7–2.4)

## 2023-09-21 LAB — GLUCOSE, CAPILLARY
Glucose-Capillary: 138 mg/dL — ABNORMAL HIGH (ref 70–99)
Glucose-Capillary: 82 mg/dL (ref 70–99)
Glucose-Capillary: 122 mg/dL — ABNORMAL HIGH (ref 70–99)
Glucose-Capillary: 277 mg/dL — ABNORMAL HIGH (ref 70–99)
Glucose-Capillary: 357 mg/dL — ABNORMAL HIGH (ref 70–99)
Glucose-Capillary: 296 mg/dL — ABNORMAL HIGH (ref 70–99)

## 2023-09-21 LAB — VITAMIN B12: Vitamin B-12: 382 pg/mL (ref 180–914)

## 2023-09-21 LAB — FOLATE: Folate: 8 ng/mL (ref >5.9)

## 2023-09-21 MED ORDER — INSULIN ASPART 100 UNIT/ML IJ SOLN
0.0000 [IU] | Freq: Three times a day (TID) | INTRAMUSCULAR | Status: DC
  Administered 2023-09-21: 15 [IU] via SUBCUTANEOUS
  Administered 2023-09-21: 2 [IU] via SUBCUTANEOUS

## 2023-09-21 MED ORDER — FUROSEMIDE 10 MG/ML IJ SOLN
40.0000 mg | Freq: Once | INTRAMUSCULAR | Status: AC
  Administered 2023-09-21: 40 mg via INTRAVENOUS
  Filled 2023-09-21: qty 4

## 2023-09-21 MED ORDER — ORAL CARE MOUTH RINSE
15.0000 mL | OROMUCOSAL | Status: DC
  Administered 2023-09-21 (×2): 15 mL via OROMUCOSAL

## 2023-09-21 MED ORDER — METOPROLOL TARTRATE 12.5 MG HALF TABLET: 12.5000 mg | ORAL_TABLET | Freq: Four times a day (QID) | ORAL | Status: DC

## 2023-09-21 MED ORDER — FERROUS SULFATE 325 (65 FE) MG PO TABS
325.0000 mg | ORAL_TABLET | Freq: Two times a day (BID) | ORAL | Status: DC
  Filled 2023-09-21: qty 1

## 2023-09-21 MED ORDER — INSULIN GLARGINE-YFGN 100 UNIT/ML ~~LOC~~ SOLN
14.0000 [IU] | Freq: Every day | SUBCUTANEOUS | Status: DC
  Administered 2023-09-21: 14 [IU] via SUBCUTANEOUS
  Filled 2023-09-21 (×2): qty 0.14

## 2023-09-21 MED ORDER — PANTOPRAZOLE SODIUM 40 MG PO TBEC
40.0000 mg | DELAYED_RELEASE_TABLET | Freq: Every day | ORAL | Status: DC
  Administered 2023-09-21 – 2023-09-29 (×9): 40 mg via ORAL
  Filled 2023-09-21 (×9): qty 1

## 2023-09-21 MED ORDER — GUAIFENESIN 200 MG PO TABS
600.0000 mg | ORAL_TABLET | Freq: Two times a day (BID) | ORAL | Status: DC
  Administered 2023-09-21 – 2023-09-27 (×13): 600 mg via ORAL
  Filled 2023-09-21 (×15): qty 3

## 2023-09-21 MED ORDER — LEVETIRACETAM 500 MG PO TABS: 500.0000 mg | ORAL_TABLET | Freq: Two times a day (BID) | ORAL | Status: DC

## 2023-09-21 MED ORDER — POLYETHYLENE GLYCOL 3350 17 G PO PACK
17.0000 g | PACK | Freq: Every day | ORAL | Status: DC | PRN
  Administered 2023-09-27: 17 g via ORAL
  Filled 2023-09-21: qty 1

## 2023-09-21 MED ORDER — DOCUSATE SODIUM 50 MG/5ML PO LIQD: 100.0000 mg | Freq: Two times a day (BID) | ORAL | Status: DC | PRN

## 2023-09-21 MED ORDER — CEFEPIME HCL 2 G IV SOLR
2.0000 g | INTRAVENOUS | Status: DC
  Administered 2023-09-21: 2 g via INTRAVENOUS
  Filled 2023-09-21: qty 12.5

## 2023-09-21 MED ORDER — FERROUS SULFATE 325 (65 FE) MG PO TABS
325.0000 mg | ORAL_TABLET | Freq: Two times a day (BID) | ORAL | Status: DC
  Administered 2023-09-22 – 2023-09-29 (×14): 325 mg via ORAL
  Filled 2023-09-21 (×14): qty 1

## 2023-09-21 MED ORDER — ORAL CARE MOUTH RINSE: 15.0000 mL | OROMUCOSAL | Status: DC | PRN

## 2023-09-21 MED ORDER — POTASSIUM CHLORIDE 20 MEQ PO PACK
40.0000 meq | PACK | Freq: Once | ORAL | Status: AC
  Administered 2023-09-21: 40 meq
  Filled 2023-09-21: qty 2

## 2023-09-21 MED ORDER — ENSURE ENLIVE PO LIQD
237.0000 mL | Freq: Two times a day (BID) | ORAL | Status: DC
  Administered 2023-09-21 – 2023-09-26 (×9): 237 mL via ORAL

## 2023-09-21 MED ORDER — APIXABAN 5 MG PO TABS
5.0000 mg | ORAL_TABLET | Freq: Two times a day (BID) | ORAL | Status: DC
  Administered 2023-09-21 – 2023-09-29 (×16): 5 mg via ORAL
  Filled 2023-09-21 (×16): qty 1

## 2023-09-21 MED ORDER — ACETAMINOPHEN 160 MG/5ML PO SOLN
650.0000 mg | ORAL | Status: DC | PRN
  Administered 2023-09-24 – 2023-09-25 (×2): 650 mg via ORAL
  Filled 2023-09-21 (×2): qty 20.3

## 2023-09-21 MED ORDER — CARVEDILOL 12.5 MG PO TABS
12.5000 mg | ORAL_TABLET | Freq: Two times a day (BID) | ORAL | Status: DC
  Administered 2023-09-21 – 2023-09-25 (×7): 12.5 mg via ORAL
  Filled 2023-09-21 (×8): qty 1

## 2023-09-21 MED ORDER — SODIUM CHLORIDE 0.9 % IV SOLN
2.0000 g | INTRAVENOUS | Status: DC
  Administered 2023-09-22: 2 g via INTRAVENOUS
  Filled 2023-09-21: qty 12.5

## 2023-09-21 MED ORDER — IRON SUCROSE 200 MG IVPB - SIMPLE MED
200.0000 mg | Freq: Once | Status: AC
  Administered 2023-09-21: 200 mg via INTRAVENOUS
  Filled 2023-09-21: qty 200

## 2023-09-21 NOTE — TOC Progression Note (Signed)
 Transition of Care [facility]) - Progression Note    Patient Details  Name: Cory [name] MRN: [id number] Date of Birth: [birth year]  [facility] CM/SW Contact  Tom-Johnson, Angelique Ken, RN Phone Number: 09/21/2023, 1:04 PM  Clinical Narrative:     Patient was extubated yesterday, currently on room air. Cardiology following for A-Flutter, on Eliquis . Neurology following. Continues on IV abx.  No PT/OT recommendations at this time, awaiting eval for dispositon.  Patient not Medically ready for discharge.  CM will continue to follow as patient progresses with care towards discharge.           Expected Discharge Plan and Services                                               Social Determinants of Health (SDOH) Interventions SDOH Screenings   Food Insecurity: No Food Insecurity (09/18/2023)  Housing: Low Risk  (09/18/2023)  Transportation Needs: No Transportation Needs (09/18/2023)  Utilities: Not At Risk (09/18/2023)  Social Connections: Patient Unable To Answer (09/18/2023)  Tobacco Use: Medium Risk (09/18/2023)    Readmission Risk Interventions     No data to display

## 2023-09-21 NOTE — Evaluation (Signed)
 Clinical/Bedside Swallow Evaluation Patient Details  Name: Cory [name] MRN: [id number] Date of Birth: [birth year]  Today's Date: 09/21/2023 Time: SLP Start Time (ACUTE ONLY): 0825 SLP Stop Time (ACUTE ONLY): 0838 SLP Time Calculation (min) (ACUTE ONLY): 13 min  Past Medical History:  Past Medical History:  Diagnosis Date   Atrial flutter (HCC)    BPH (benign prostatic hyperplasia)    CKD (chronic kidney disease), stage III (HCC)    Colon cancer (HCC)    DM (diabetes mellitus), type 2 (HCC)    HLD (hyperlipidemia)    HTN (hypertension)    Hx of right BKA (HCC)    Prostate CA (HCC)    Prostate CA (HCC)    Past Surgical History: History reviewed. No pertinent surgical history. 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  Intubated 5/12-5/14. 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    Assessment / Plan / Recommendation  Clinical Impression  Pt exhibits a suspected acute reversible pharyngeal dysphagia following 2 day intubation. He has majority of dentition and is missing several. Vocal quality is clear with adequate intensity and strong cough. He does affirm odonophagia s/p intubation. Cup sips thin liquids resulted in multiple swallows with possible pharyngeal residue. When he took consecutive sips thin with straw he had immediate cough. Remainder of eval he consumed cup sips (fed by SLP as pt had difficultly holding cup with therapist) without s/s aspiration. Mastication and transit with cracker was prolonged therefore recommend he initiate  Dys 3 texture, thin liquids, no straws, pills whole in puree and will need assist with feeding. ST will continue to follow for safety/efficiency and ability to upgrade texture. SLP Visit Diagnosis: Dysphagia, unspecified (R13.10)    Aspiration Risk  Mild aspiration risk    Diet Recommendation Dysphagia 3 (Mech soft);Thin liquid    Liquid Administration via: No straw;Cup Medication Administration: Whole meds with puree Supervision: Staff to assist with self feeding;Full supervision/cueing for compensatory strategies Compensations: Slow rate;Small sips/bites Postural Changes: Seated upright at 90 degrees    Other  Recommendations Oral Care Recommendations: Oral care BID    Recommendations for follow up therapy are one component of a multi-disciplinary discharge planning process, led by the attending physician.  Recommendations may be updated based on patient status, additional functional criteria and insurance authorization.  Follow up Recommendations  (TBD)      Assistance Recommended at Discharge    Functional Status Assessment Patient has had a recent decline in their functional status and demonstrates the ability to make significant improvements in function in a reasonable and predictable amount of time.  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  Intubated 5/12-5/14. CT No evidence of an acute intracranial abnormality, left-sided scalp hematoma, parenchymal atrophy and chronic small vessel ischemic  disease with chronic infarcts, as described. Type of Study: Bedside Swallow Evaluation Previous Swallow Assessment:  (none) Diet Prior to this Study: NPO Temperature Spikes Noted: No Respiratory Status: Nasal cannula History of Recent Intubation: Yes Total duration of intubation (days): 2 days Date extubated: 09/20/23 Behavior/Cognition: Cooperative;Pleasant mood;Other (Comment) (slightly drowsy) Oral Cavity Assessment:  (disocolored sightly brown- lingual candidias?) Oral Care Completed by SLP: No Oral Cavity - Dentition: Missing dentition (has majority, missing several) Vision: Functional for self-feeding Self-Feeding Abilities: Needs assist Patient Positioning: Upright in bed Baseline Vocal Quality: Normal Volitional Cough: Strong Volitional Swallow: Able to elicit    Oral/Motor/Sensory Function Overall Oral Motor/Sensory Function: Within functional limits   Ice Chips Ice chips: Not tested   Thin Liquid Thin Liquid: Impaired Presentation: Straw;Cup Oral Phase Impairments:  (none) Pharyngeal  Phase Impairments: Cough - Immediate;Multiple swallows    Nectar Thick Nectar Thick Liquid: Not tested   Honey Thick Honey Thick Liquid: Not tested   Puree Puree: Within functional limits   Solid     Solid: Impaired Oral Phase Functional Implications:  (prolonged mastication) Pharyngeal Phase Impairments:  (none)      Amor Packard, Nola Battiest 09/21/2023,8:54 AM

## 2023-09-21 NOTE — Progress Notes (Signed)
 Pharmacy Antibiotic Note  Cory [name] is a 78 y.o. male admitted on 09/18/2023 with concern for aspiration pneumonia, empirically started on Ceftriaxone . TA found to have serratia marcescens - given AmpC producing organism, pharmacy has been consulted for Cefepime  dosing.   Tmax 101.9 on 5/13 - resolved. WBC wnl. Scr ok- 2.71 (CKD - unknown BL)  Plan: Discontinue ceftriaxone  2g q24h Start Cefepime  2g q24h F/u bcx and sensi, LOT, clinical status, plans for de-escalation  Height: 5\' 11"  (180.3 cm) Weight: 84.3 kg (185 lb 13.6 oz) IBW/kg (Calculated) : 75.3  Temp (24hrs), Avg:98.6 F (37 C), Min:98 F (36.7 C), Max:99.4 F (37.4 C)  Recent Labs  Lab 09/18/23 1036 09/18/23 1037 09/18/23 1038 09/18/23 1406 09/18/23 1547 09/19/23 0627 09/19/23 1657 09/20/23 0925 09/21/23 0211  WBC  --   --  9.6  --   --  7.5  --  7.8 7.4  CREATININE  --    < > 2.25*  --  2.44* 2.67* 2.87* 2.77* 2.71*  LATICACIDVEN 2.2*  --   --  2.2*  --   --   --   --   --    < > = values in this interval not displayed.    Estimated Creatinine Clearance: 24.7 mL/min (A) (by C-G formula based on SCr of 2.71 mg/dL (H)).    No Known Allergies  Antimicrobials this admission: CRO 5/13 >>5/15 Cefepime  5/15>>   Microbiology results: 5/14 Bcx: sent  5/13 TA: serratia marcescens  5/12 MRSA PCR neg  5/12 Resp PCR neg   Thank you for allowing pharmacy to be a part of this patient's care.  Deloris Moger 09/21/2023 8:34 AM

## 2023-09-21 NOTE — Telephone Encounter (Signed)
 Auth Submission: NO AUTH NEEDED Site of care: Site of care: [facility] Payer: [employer] medicare Medication & CPT/J Code(s) submitted: Venofer  (Iron  Sucrose) J1756 Route of submission (phone, fax, portal):  Phone # Fax # Auth type: Buy/Bill PB Units/visits requested: 200mg  x 5 doses Reference number:  Approval from: 09/21/23 to 01/22/24

## 2023-09-21 NOTE — Procedures (Addendum)
 Patient Name: Mohammed Mcandrew  MRN: [id number]  Epilepsy Attending: Arleene Lack  Referring Physician/Provider: Imogene Mana, NP  Duration: 09/20/2023 1917 to 09/21/2023 1048   Patient history: 78yo M with h/o seizures came with fall. EEG to evaluate for seizure   Level of alertness: awake/ lethargic   AEDs during EEG study:  LEV   Technical aspects: This EEG study was done with scalp electrodes positioned according to the 10-20 International system of electrode placement. Electrical activity was reviewed with band pass filter of 1-70Hz , sensitivity of 7 uV/mm, display speed of 62mm/sec with a 60Hz  notched filter applied as appropriate. EEG data were recorded continuously and digitally stored.  Video monitoring was available and reviewed as appropriate.   Description: EEG showed continuous generalized predominantly 5 to 8 Hz theta-alpha activity admixed with intermittent generalized 2 to 3 Hz delta slowing.  ABNORMALITY - Continuous slow, generalized   IMPRESSION: This study is suggestive of moderate diffuse encephalopathy. No seizures or epileptiform discharges were seen throughout the recording.   EEG appears to be improving compared to previous day.  Merrill Deanda O Abbie Berling

## 2023-09-21 NOTE — Telephone Encounter (Signed)
 Patient referred to infusion pharmacy team for ambulatory infusion of IV iron .  Insurance - [employer]  Dx code - D50.9 IV Iron  Therapy - Venofer  200 mg IV x 5  Infusion appointments - [facility] [employer] [facility] team will schedule patient as soon as possible.   Chrysa Rampy D. Phyllip Claw, PharmD

## 2023-09-21 NOTE — Progress Notes (Signed)
 Nutrition Follow-up  DOCUMENTATION CODES:  Not applicable  INTERVENTION:  Continue current diet as recommended by SLP Encourage PO intake Feeding assistance Juven BID to support wound healing Ensure Plus High Protein po BID, each supplement provides 350 kcal and 20 grams of protein.  NUTRITION DIAGNOSIS:  Inadequate oral intake related to acute illness as evidenced by NPO status. - remains applicable  GOAL:  Patient will meet greater than or equal to 90% of their needs - progressing  MONITOR: PO intake, Supplement acceptance, Labs, Skin  REASON FOR ASSESSMENT:  Ventilator, Consult Enteral/tube feeding initiation and management  ASSESSMENT:  Pt admitted as level 1 trauma after an unwitnessed fall. PMH significant for DM, CKD, colon cancer at splenic flexure, a flutter on Eliquis , HTN, previous seizures,  R BKA (2022), prior CVA.   5/12 - presented to ED unresponsive, intubated  5/13 - TF initiated 5/14 - extubated  Pt resting in bed with eyes closed at the time of assessment. Able to be extubated yesterday and SLP evaluated this AM. Pt advanced to a DYS3 diet with thin liquids but SLP noted that pt would benefit from assistance. Stable to be transferred out of ICU.   Will add nutrition supplements to augment intake.  Admit weight: 79.9 kg Current weight: 84.3 kg Review of weight history over the last year appears to be widely variable. Weights trending between 186-210 lbs.   Drains/lines:  UOP: x24 hours  Medications:  Scheduled Meds:  ferrous sulfate   325 mg Oral BID WC   insulin  aspart  0-15 Units Subcutaneous Q4H   insulin  glargine-yfgn  14 Units Subcutaneous Daily   pantoprazole  (PROTONIX ) IV  40 mg Intravenous Q24H   potassium chloride   40 mEq Per Tube Once   Continuous Infusions:  ceFEPime  (MAXIPIME ) IV     PRN Meds: docusate, polyethylene glycol  Labs:  BUN 48, creatinine 2.71 GFR 24 CBG's 82-201 x 24 hours HgbA1c 11.9% (5/13)  NUTRITION -  FOCUSED PHYSICAL EXAM: Flowsheet Row Most Recent Value  Orbital Region No depletion  Upper Arm Region Mild depletion  Thoracic and Lumbar Region No depletion  Buccal Region Unable to assess  Temple Region Mild depletion  Clavicle Bone Region Mild depletion  Clavicle and Acromion Bone Region No depletion  Scapular Bone Region Unable to assess  Dorsal Hand Unable to assess  [hand mits]  Patellar Region Severe depletion  Anterior Thigh Region Severe depletion  Posterior Calf Region Moderate depletion  Edema (RD Assessment) None  Hair Reviewed  Eyes Unable to assess  Mouth Reviewed  Skin Reviewed  Nails Unable to assess    Diet Order:   Diet Order             DIET DYS 3 Room service appropriate? Yes with Assist; Fluid consistency: Thin  Diet effective now                   EDUCATION NEEDS:  No education needs have been identified at this time  Skin:  Skin Assessment: Skin Integrity Issues: Skin Integrity Issues:: Stage III, Other (Comment) Stage III: medial sacrum Other: L cervical skin tear  Last BM:  5/12  Height:  Ht Readings from Last 1 Encounters:  09/18/23 5\' 11"  (1.803 m)    Weight:  Wt Readings from Last 1 Encounters:  09/20/23 84.3 kg   BMI:  Body mass index is 25.92 kg/m.  Estimated Nutritional Needs:  Kcal:  1900-2100 Protein:  105-120g Fluid:  >/=1.9L   Edwena Graham, RD, LDN  Registered Dietitian II Please reach out via secure chat

## 2023-09-21 NOTE — Plan of Care (Signed)
 O2 saturations good on RA; poor po - started D3 diet per speech therapy today; called niece to bring prosthetic leg for patient to begin to work with PT.

## 2023-09-21 NOTE — Progress Notes (Signed)
   Patient Name: Cory [name] Date of Encounter: 09/21/2023 [facility] Cardiologist: Cory Kross K [name], MD   Interval Summary  .    Extubated yesterday.   Vital Signs .    Vitals:   09/21/23 0500 09/21/23 0600 09/21/23 0700 09/21/23 0731  BP: (!) 136/91 (!) 140/88 (!) 131/91   Pulse: 85 84 70   Resp: 13 17 18    Temp:    98 F (36.7 C)  TempSrc:    Axillary  SpO2: 100% 100% 100%   Weight:      Height:        Intake/Output Summary (Last 24 hours) at 09/21/2023 1101 Last data filed at 09/21/2023 0700 Gross per 24 hour  Intake 491.27 ml  Output 1600 ml  Net -1108.73 ml      09/20/2023    3:24 AM 09/19/2023    5:00 AM 09/18/2023    1:45 PM  Last 3 Weights  Weight (lbs) 185 lb 13.6 oz 176 lb 5.9 oz 176 lb 2.4 oz  Weight (kg) 84.3 kg 80 kg 79.9 kg      Telemetry/ECG    Atrial flutter with variable conduction- Personally Reviewed  Physical Exam .   GEN: No acute distress.   Neuro:  Grossly intact Neck: No JVD Cardiac:  Regular with regular pauses (due to variable conduction), no murmurs, rubs, or gallops.  Respiratory: Clear to auscultation bilaterally. GI: Soft, nontender, non-distended  MS: No edema; warm and well perfused  Assessment & Plan .     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  Change Lopressor  12.5mg  to Coreg  12.5 mg  Hold on ARB/Entresto given AKI  Consider Jardiance, spironolactone later this admission  -Atrial flutter  Variable conducton  Continue BB as above  Continue Eliquis  5 bid -HL  Holding statin given elevated CK  -AKI/CKD  Cr improving, continue to monitor  -Metabolic encephalopathy  -Per primary team  -EEG with diffuse slowing, no seizures seen as of yet  -Sepsis  -On abx per primary team  CRITICAL CARE Performed by: Mashelle Busick K Sheretha Shadd   Total critical care time: 30 minutes  Critical care time was exclusive of separately billable procedures and treating  other patients.  Critical care was necessary to treat or prevent imminent or life-threatening deterioration.  Critical care was time spent personally by me on the following activities: development of treatment plan with patient and/or surrogate as well as nursing, discussions with consultants, evaluation of patient's response to treatment, examination of patient, obtaining history from patient or surrogate, ordering and performing treatments and interventions, ordering and review of laboratory studies, ordering and review of radiographic studies, pulse oximetry and re-evaluation of patient's condition.  For questions or updates, please contact [facility] Please consult [email or web address] for contact info under        Signed, Diyana Starrett K Ying Blankenhorn, MD

## 2023-09-21 NOTE — Progress Notes (Addendum)
 NAME:  Cory [name], MRN:  [id number], DOB:  [birth year], LOS: 3 ADMISSION DATE:  09/18/2023, CONSULTATION DATE:  09/18/2023 REFERRING MD:  Dr. Randal Bury, CHIEF COMPLAINT:  AMS   History of Present Illness:  HPI obtained from EMR as pt is sedated and intubated on MV.  See additional chart MRN [id number].   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  GCS 5 on arrival with pill rolling motions noted in BUEs.  Intubated for airway protection.  Workup with CT head, chest, abd/pelvis negative for traumatic injuries.  Labs noted for glucose 437, BUN/ sCr 26/ 2.25, t. Bili 1.3, albumin 3.4, CK 621, lactic 2.2, INR 1.3, H/H 13.9/ 42.  Currently sedated on propofol  and fentanyl  due to biting of ett.  PCCM consulted for medical admit.    Of note, previous admit in 04/2023 with COVID, rhabdomyolysis, DKA, and seizures 2/2 hyperglycemia.  Spoke with niece/ HPOA [name] by phone who states pt was in his normal state of health on Saturday, after taking him to his scheduled MD appointment, only recent complaint of sneezing.  Otherwise pt lives independently and has a caregiver that checks on him throughout the week.   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 No sz on eeg  5/15 SLP -- dys 3 diet, txf out of ICU. Change rocephin  to cefepime  for serratia   Interim History / Subjective:   Remains extubated   Objective    Blood pressure (!) 131/91, pulse 70, temperature 98 F (36.7 C), temperature source Axillary, resp. rate 18,  height 5\' 11"  (1.803 m), weight 84.3 kg, SpO2 100%.    Vent Mode: Stand-by FiO2 (%):  [40 %] 40 % Set Rate:  [16 bmp] 16 bmp Vt Set:  [600 mL] 600 mL PEEP:  [5 cmH20] 5 cmH20 Plateau Pressure:  [16 cmH20-18 cmH20] 18 cmH20   Intake/Output Summary (Last 24 hours) at 09/21/2023 1011 Last data filed at 09/21/2023 0700 Gross per 24 hour  Intake 558.58 ml  Output 1600 ml  Net -1041.42 ml   Filed Weights   09/18/23 1345 09/19/23 0500 09/20/23 0324  Weight: 79.9 kg 80 kg 84.3 kg    Examination:  General:  chronically ill older adult M NAD  HEENT: NCAT pink mm anicteric sclera. EEG in place  Neuro: Awake lethargic oriented x3 following commands. Dysarthric  CV: cap refill brisk s1s2  PULM:  Symmetrical chest expansion, diminished bilat sounds.  GI: soft ndnt  GU: foley yellow urine  Extremities: R BKA. Bilat hand edema  Skin: c/d  Resolved Hospital Problem list    Assessment & Plan:    Acute metabolic encephalopathy Fall on eliquis   L scalp hematoma  Hx prior CVA Dysphagia  - previous hx of seizures in 04/2023 felt 2/2 to DKA - CTH neg, UDS neg, Ammonia neg - spot EEG 5/12> severe diffuse encephalopathy, no seizures/ epileptiform discharges. No sz captured on cEEG through 5/14  P:  -delirium precautions  -stop Keppra  -remains on cEEG, expect we can stop this soon.  -  sz precautions  -dys 3 diet   Acute respiratory failure w hypoxia  Serratia PNA  Volume overload  P -pulm hygiene, mobility, IS   -changing rocephin  to cefepime  5/15  -Wean O2 for goal > 92  -lasix  x1 5/15 -- will check afternoon BMP 5/15 for K   Severe sepsis 2/2 serratia PNA  P:  -rocephin  -- change to cefepime  5/15 -follow Bcx   AKI on CKD 3a, improving  Elevated CK, improving  Hypokalemia  P -replace K 5/15 -checking BMP 5/15 PM (getting lasix ) and 5/16 AM -- replace further PRN  -AM CK   DM2 w hyperglycemia, poorly controlled  P -SSI + semglee  14u daily   Aflutter on eliquis  Acute  systolic HF HTN HLD  -HF is suspected to be r/t sepsis and likely to improve P -metop per cards  -eliquis   -statin on hold w elevated CK -- recheck 5/16    Acute anemia Fe deficiency P -Fe supplementation -PRN CBC   Colon Cancer of splenic flexure - followed by Dr. [name], previous surgery in January put off given December hospitalization with plans to reschedule soon.  Will be postponed again given hospitalization.   Stage 3 pressure injury coccyx/ sacrum- POA - per WOC recommendations  Best Practice (right click and "Reselect all SmartList Selections" daily)   Diet/type: dys 3 starting 5/15  DVT prophylaxis DOAC Pressure ulcer(s): present on admission  GI prophylaxis: PPI Lines: n/a Foley:  Yes, and it is no longer needed and removal ordered  Code Status:  full code Last date of multidisciplinary goals of care discussion [5/12]  Niece, [name] is HPOA (on file)> pending update 5/14.   Labs   CBC: Recent Labs  Lab 09/18/23 1038 09/18/23 1206 09/19/23 0627 09/20/23 0925 09/20/23 1615 09/21/23 0211  WBC 9.6  --  7.5 7.8  --  7.4  HGB 13.9 11.6* 11.6* 11.0* 9.2* 10.2*  HCT 42.9 34.0* 34.3* 33.9* 27.0* 30.5*  MCV 77.6*  --  76.2* 79.4*  --  78.4*  PLT 166  --  153 140*  --  132*    Basic Metabolic Panel: Recent Labs  Lab 09/18/23 1547 09/19/23 0627 09/19/23 0910 09/19/23 1657 09/20/23 0925 09/20/23 1615 09/21/23 0211  NA 140 142  --  143 140 142 144  K 3.4* 2.6*  --  3.9 3.4* 4.2 3.6  CL 104 109  --  108 109  --  110  CO2 20* 24  --  25 22  --  24  GLUCOSE 378* 72  --  120* 143*  --  106*  BUN 29* 29*  --  29* 40*  --  48*  CREATININE 2.44* 2.67*  --  2.87* 2.77*  --  2.71*  CALCIUM 9.1 8.6*  --  8.6* 8.5*  --  8.3*  MG  --   --  1.9  --  2.2  --  2.3  PHOS  --   --  2.1*  --  3.7  --   --    GFR: Estimated Creatinine Clearance: 24.7 mL/min (A) (by C-G formula based on SCr of 2.71 mg/dL (H)). Recent Labs  Lab 09/18/23 1036 09/18/23 1038  09/18/23 1406 09/19/23 0627 09/20/23 0925 09/21/23 0211  WBC  --  9.6  --  7.5 7.8 7.4  LATICACIDVEN 2.2*  --  2.2*  --   --   --     Liver Function Tests: Recent Labs  Lab 09/18/23 1038 09/19/23 0627  AST 29 38  ALT 17 21  ALKPHOS 82 56  BILITOT 1.3* 0.7  PROT 7.9 5.8*  ALBUMIN 3.4* 2.4*   No results for input(s): "LIPASE", "AMYLASE" in the last 168 hours. Recent Labs  Lab 09/18/23 1547  AMMONIA 21    ABG    Component Value Date/Time   PHART 7.467 (H) 09/20/2023 1615   PCO2ART 31.5 (L) 09/20/2023 1615   PO2ART 112 (H) 09/20/2023 1615   HCO3 22.9 09/20/2023 1615   TCO2 24 09/20/2023 1615   ACIDBASEDEF 1.0 09/20/2023 1615   O2SAT 99 09/20/2023 1615     Coagulation Profile: Recent Labs  Lab 09/18/23 1038  INR 1.3*    Cardiac Enzymes: Recent Labs  Lab 09/18/23 1038 09/19/23 0627 09/20/23 0925  CKTOTAL 621* 1,841* 630*  CKMB 17.9*  --   --     HbA1C: Hgb A1c MFr Bld  Date/Time Value Ref Range Status  09/19/2023 06:24 AM 11.9 (H) 4.8 - 5.6 % Final    Comment:    (NOTE)         Prediabetes: 5.7 - 6.4         Diabetes: >6.4         Glycemic control for adults with diabetes: <7.0   09/18/2023 02:06 PM 12.3 (H) 4.8 - 5.6 % Final    Comment:    (NOTE)         Prediabetes: 5.7 - 6.4         Diabetes: >6.4         Glycemic control for adults with diabetes: <7.0     CBG: Recent Labs  Lab 09/20/23 1527 09/20/23 1943 09/20/23 2316 09/21/23 0313 09/21/23 0729  GLUCAP 201* 164* 118* 138* 82    CCT n/a   High MDM  Eston Hence MSN, AGACNP-BC  Pulmonary/Critical Care Medicine Amion for pager  09/21/2023, 10:11 AM

## 2023-09-21 NOTE — Progress Notes (Signed)
 LTM maint complete - no skin breakdown under:   GRND, F3, F4

## 2023-09-21 NOTE — Plan of Care (Signed)
  Problem: Clinical Measurements: Goal: Ability to maintain clinical measurements within normal limits will improve Outcome: Progressing Goal: Will remain free from infection Outcome: Progressing Goal: Diagnostic test results will improve Outcome: Progressing Goal: Respiratory complications will improve Outcome: Progressing Goal: Cardiovascular complication will be avoided Outcome: Progressing   Problem: Activity: Goal: Risk for activity intolerance will decrease Outcome: Progressing   Problem: Coping: Goal: Level of anxiety will decrease Outcome: Progressing   Problem: Pain Managment: Goal: General experience of comfort will improve and/or be controlled Outcome: Progressing   Problem: Skin Integrity: Goal: Risk for impaired skin integrity will decrease Outcome: Progressing   Problem: Coping: Goal: Ability to adjust to condition or change in health will improve Outcome: Progressing   Problem: Metabolic: Goal: Ability to maintain appropriate glucose levels will improve Outcome: Progressing   Problem: Skin Integrity: Goal: Risk for impaired skin integrity will decrease Outcome: Progressing   Problem: Tissue Perfusion: Goal: Adequacy of tissue perfusion will improve Outcome: Progressing   Problem: Respiratory: Goal: Ability to maintain a clear airway and adequate ventilation will improve Outcome: Progressing

## 2023-09-22 ENCOUNTER — Telehealth (HOSPITAL_COMMUNITY): Payer: Self-pay

## 2023-09-22 ENCOUNTER — Other Ambulatory Visit (HOSPITAL_COMMUNITY): Payer: Self-pay

## 2023-09-22 ENCOUNTER — Encounter: Payer: Self-pay | Admitting: Pulmonary Disease

## 2023-09-22 DIAGNOSIS — G9340 Encephalopathy, unspecified: Secondary | ICD-10-CM | POA: Diagnosis not present

## 2023-09-22 DIAGNOSIS — I5021 Acute systolic (congestive) heart failure: Secondary | ICD-10-CM | POA: Diagnosis not present

## 2023-09-22 DIAGNOSIS — E785 Hyperlipidemia, unspecified: Secondary | ICD-10-CM | POA: Diagnosis not present

## 2023-09-22 DIAGNOSIS — I4892 Unspecified atrial flutter: Secondary | ICD-10-CM | POA: Diagnosis not present

## 2023-09-22 LAB — BASIC METABOLIC PANEL WITH GFR
Anion gap: 10 (ref 5–15)
BUN: 39 mg/dL — ABNORMAL HIGH (ref 8–23)
CO2: 24 mmol/L (ref 22–32)
Calcium: 8.7 mg/dL — ABNORMAL LOW (ref 8.9–10.3)
Chloride: 111 mmol/L (ref 98–111)
Creatinine, Ser: 2.46 mg/dL — ABNORMAL HIGH (ref 0.61–1.24)
GFR, Estimated: 26 mL/min — ABNORMAL LOW (ref >60)
Glucose, Bld: 233 mg/dL — ABNORMAL HIGH (ref 70–99)
Potassium: 3.7 mmol/L (ref 3.5–5.1)
Sodium: 145 mmol/L (ref 135–145)

## 2023-09-22 LAB — CBC
HCT: 33.5 % — ABNORMAL LOW (ref 39.0–52.0)
Hemoglobin: 10.7 g/dL — ABNORMAL LOW (ref 13.0–17.0)
MCH: 25.2 pg — ABNORMAL LOW (ref 26.0–34.0)
MCHC: 31.9 g/dL (ref 30.0–36.0)
MCV: 78.8 fL — ABNORMAL LOW (ref 80.0–100.0)
Platelets: 157 10*3/uL (ref 150–400)
RBC: 4.25 MIL/uL (ref 4.22–5.81)
RDW: 15.1 % (ref 11.5–15.5)
WBC: 5.7 10*3/uL (ref 4.0–10.5)
nRBC: 0 % (ref 0.0–0.2)

## 2023-09-22 LAB — CK: Total CK: 184 U/L (ref 49–397)

## 2023-09-22 LAB — CULTURE, RESPIRATORY W GRAM STAIN

## 2023-09-22 LAB — GLUCOSE, CAPILLARY
Glucose-Capillary: 170 mg/dL — ABNORMAL HIGH (ref 70–99)
Glucose-Capillary: 229 mg/dL — ABNORMAL HIGH (ref 70–99)
Glucose-Capillary: 258 mg/dL — ABNORMAL HIGH (ref 70–99)
Glucose-Capillary: 303 mg/dL — ABNORMAL HIGH (ref 70–99)
Glucose-Capillary: 323 mg/dL — ABNORMAL HIGH (ref 70–99)
Glucose-Capillary: 332 mg/dL — ABNORMAL HIGH (ref 70–99)

## 2023-09-22 LAB — MAGNESIUM: Magnesium: 2.1 mg/dL (ref 1.7–2.4)

## 2023-09-22 MED ORDER — POTASSIUM CHLORIDE CRYS ER 20 MEQ PO TBCR
40.0000 meq | EXTENDED_RELEASE_TABLET | Freq: Once | ORAL | Status: AC
  Administered 2023-09-22: 40 meq via ORAL
  Filled 2023-09-22: qty 2

## 2023-09-22 MED ORDER — FUROSEMIDE 10 MG/ML IJ SOLN
40.0000 mg | Freq: Once | INTRAMUSCULAR | Status: AC
  Administered 2023-09-22: 40 mg via INTRAVENOUS
  Filled 2023-09-22: qty 4

## 2023-09-22 MED ORDER — ISOSORB DINITRATE-HYDRALAZINE 20-37.5 MG PO TABS
1.0000 | ORAL_TABLET | Freq: Three times a day (TID) | ORAL | Status: DC
  Administered 2023-09-22 – 2023-09-23 (×4): 1 via ORAL
  Filled 2023-09-22 (×4): qty 1

## 2023-09-22 MED ORDER — INSULIN GLARGINE-YFGN 100 UNIT/ML ~~LOC~~ SOLN
20.0000 [IU] | Freq: Every day | SUBCUTANEOUS | Status: DC
  Administered 2023-09-22 – 2023-09-29 (×8): 20 [IU] via SUBCUTANEOUS
  Filled 2023-09-22 (×8): qty 0.2

## 2023-09-22 MED ORDER — IPRATROPIUM BROMIDE 0.02 % IN SOLN
0.5000 mg | Freq: Two times a day (BID) | RESPIRATORY_TRACT | Status: DC
  Administered 2023-09-22 – 2023-09-25 (×6): 0.5 mg via RESPIRATORY_TRACT
  Filled 2023-09-22 (×8): qty 2.5

## 2023-09-22 MED ORDER — SODIUM CHLORIDE 0.9 % IV SOLN
500.0000 mg | Freq: Two times a day (BID) | INTRAVENOUS | Status: DC
  Administered 2023-09-22 – 2023-09-23 (×2): 500 mg via INTRAVENOUS
  Filled 2023-09-22 (×3): qty 10

## 2023-09-22 MED ORDER — INSULIN ASPART 100 UNIT/ML IJ SOLN
0.0000 [IU] | INTRAMUSCULAR | Status: DC
  Administered 2023-09-22 (×2): 4 [IU] via SUBCUTANEOUS
  Administered 2023-09-22: 1 [IU] via SUBCUTANEOUS
  Administered 2023-09-22: 2 [IU] via SUBCUTANEOUS

## 2023-09-22 MED ORDER — INSULIN ASPART 100 UNIT/ML IJ SOLN
0.0000 [IU] | Freq: Three times a day (TID) | INTRAMUSCULAR | Status: DC
  Administered 2023-09-22: 4 [IU] via SUBCUTANEOUS
  Administered 2023-09-23: 3 [IU] via SUBCUTANEOUS
  Administered 2023-09-23: 1 [IU] via SUBCUTANEOUS
  Administered 2023-09-23: 2 [IU] via SUBCUTANEOUS
  Administered 2023-09-25: 4 [IU] via SUBCUTANEOUS
  Administered 2023-09-25: 5 [IU] via SUBCUTANEOUS
  Administered 2023-09-26: 1 [IU] via SUBCUTANEOUS
  Administered 2023-09-26: 3 [IU] via SUBCUTANEOUS
  Administered 2023-09-26: 1 [IU] via SUBCUTANEOUS
  Administered 2023-09-27: 2 [IU] via SUBCUTANEOUS
  Administered 2023-09-28: 1 [IU] via SUBCUTANEOUS

## 2023-09-22 NOTE — Progress Notes (Signed)
 Speech Language Pathology Treatment: Dysphagia  Patient Details Name: [name] MRN: [id number] DOB: 11-17-1945 Today's Date: 09/22/2023 Time: 7829-5621 SLP Time Calculation (min) (ACUTE ONLY): 11 min  Assessment / Plan / Recommendation Clinical Impression  Pt seen for dysphagia follow up with niece and OT present and eating breakfast when SLP arrived. He needs assist for feeding but was able to achieve hand over hand with cup. Trialed straw today and he did not have overt signs of aspiration correlated with straw. There was one delayed cough throughout session. He continues to exhibit an oral dysphagia with current diet and upgraded trial with graham cracker marked by prolonged mastication and transit with mild lingual residue. Niece reported eggs were harder for him to manipulate. He needed cues for small sips and benefited from sips liquid throughout meal to assist in clearance. ST will continue to follow for safety/efficiency and upgrade when/if appropriate.    HPI HPI: This is a 78 yo black male with a significant PMH including DM, CKD, colon cancer in splenic flexure, a flutter on Eliquis , HTN, previous seizures secondary to hyperglycemia, previous encephalopathy, BKA in 2022, previous CVA who apparently lives at home by himself and was found down from a presumed unwitnessed fall by his aid when she came to the house.  Intubated 5/12-5/14. 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      SLP Plan  Continue with current plan of care      Recommendations for follow up therapy are one component of a multi-disciplinary discharge planning process, led by the attending physician.  Recommendations may be updated based on patient status, additional functional criteria and insurance authorization.    Recommendations  Diet recommendations: Dysphagia 3 (mechanical  soft);Thin liquid Liquids provided via: Straw;Cup Medication Administration: Whole meds with puree Supervision: Staff to assist with self feeding Compensations: Slow rate;Small sips/bites;Lingual sweep for clearance of pocketing;Follow solids with liquid Postural Changes and/or Swallow Maneuvers: Seated upright 90 degrees                  Oral care BID   Frequent or constant Supervision/Assistance Dysphagia, unspecified (R13.10)     Continue with current plan of care     [name]  09/22/2023, 10:10 AM

## 2023-09-22 NOTE — Inpatient Diabetes Management (Addendum)
 Inpatient Diabetes Program Recommendations  AACE/ADA: New Consensus Statement on Inpatient Glycemic Control (2015)  Target Ranges:  Prepandial:   less than 140 mg/dL      Peak postprandial:   less than 180 mg/dL (1-2 hours)      Critically ill patients:  140 - 180 mg/dL   Lab Results  Component Value Date   GLUCAP 170 (H) 09/22/2023   HGBA1C 11.9 (H) 09/19/2023    Review of Glycemic Control  Latest Reference Range & Units 09/21/23 11:25 09/21/23 15:25 09/21/23 17:04 09/21/23 21:16 09/22/23 00:22 09/22/23 04:09 09/22/23 08:00  Glucose-Capillary 70 - 99 mg/dL 528 (H) 413 (H) 244 (H) 296 (H) 323 (H) 229 (H) 170 (H)   Diabetes history: DM 2 Outpatient Diabetes medications:  Invokana 300 mg daily- not taking Dexcom sensor Lantus  30 units daily Current orders for Inpatient glycemic control:  Novolog  0-6 units q 4 hours Semglee  20 units daily  Inpatient Diabetes Program Recommendations:    Called and spoke with patient's niece, Cory [name] regarding home management of DM. She states that he was independent with medications and wore Dexcom sensor for DM.  Share with her that patient's A1C is elevated and she states that she assumes he was taking his insulin .  Will follow up with patient to discuss.   Addendum 11:00- Spoke to patient regarding DM management.  He states that he is taking the Lantus  and the Humalog based on his blood sugars.  I explained that his A1C is elevated (note it was down to 9.5% earlier in the year).  We discussed the importance of improved glycemic control and taking insulin  consistently.  He does have sensor and verifies that he is running high.  Patient slow with some responses .  Likely need increased supervision and help at home with DM management.  Niece stated he saw PCP last week.  Recommend continued titration in the hospital of insulin /DM medications.  Needs close f/u outpatient as well.  ? If he would benefit from Home health RN?  Also will place referral for [facility] f/u  with pharmacy for DM management.   Thanks,  [name], RN, BC-ADM Inpatient Diabetes Coordinator Pager [phone]  (8a-5p)

## 2023-09-22 NOTE — Progress Notes (Signed)
  Inpatient Rehab Admissions Coordinator :  Per therapy recommendations, patient was screened for CIR candidacy by Ottie Glazier RN MSN.  At this time patient appears to be a potential candidate for CIR. I will place a rehab consult per protocol for full assessment. Please call me with any questions.  Ottie Glazier RN MSN Admissions Coordinator [phone]

## 2023-09-22 NOTE — Evaluation (Signed)
 Occupational Therapy Evaluation Patient Details Name: Cory [name] MRN: [id number] DOB: 1946/01/01 Today's Date: 09/22/2023   History of Present Illness   Pt is a 78 yo male found down by his aid and found to have severy encephalopathy, DKA, CRF. CT with no large findings. PMH: sz d/o, RBKA 2022, aflutter, CKD, colon CA, prostate, CA, DM, HTN, CVA, R pupil fixed adn dialated chronically.     Clinical Impressions Pt admitted with the above diagnosis and has the deficits outlined below. Pt would benefit from cont OT to increase independence with basic adls and adl transfers back to baseline so he can d/c home to his apartment.  Pt was previously modified independent with all adls outside of driving. Pt has an aid that comes 2x week for 4 hours to assist with laundry, cleaning etc but otherwise pt is independent with use of w/c, rollator and prosthetic.  Niece works full time but assists with groceries etc.  Pt is progressing well and need to see how next few days go when prosthetic is here but feel he may need HHOT if he progresses well. If not, pt may be a candidate for more than 3 hours of therapy a day given he has a very supportive family and should be able to achieve mod I level of care to return to his apartment. Will continue to assess for d/c planning.      If plan is discharge home, recommend the following:   A lot of help with bathing/dressing/bathroom;Assistance with cooking/housework;Direct supervision/assist for medications management;Direct supervision/assist for financial management;Assist for transportation;Supervision due to cognitive status     Functional Status Assessment   Patient has had a recent decline in their functional status and demonstrates the ability to make significant improvements in function in a reasonable and predictable amount of time.     Equipment Recommendations   None recommended by OT     Recommendations for Other Services          Precautions/Restrictions   Precautions Precautions: Fall;Other (comment) Recall of Precautions/Restrictions: Impaired Precaution/Restrictions Comments: Pt with prosthetic that neice will bring in on 09/23/23 Required Braces or Orthoses: Other Brace Other Brace: RLE prosthetic Restrictions Weight Bearing Restrictions Per Provider Order: No     Mobility Bed Mobility Overal bed mobility: Needs Assistance Bed Mobility: Supine to Sit     Supine to sit: Mod assist     General bed mobility comments: assist to scoot to EOB    Transfers Overall transfer level: Needs assistance Equipment used: 2 person hand held assist Transfers: Sit to/from Stand, Bed to chair/wheelchair/BSC Sit to Stand: Mod assist, +2 physical assistance   Squat pivot transfers: Mod assist       General transfer comment: +2 assist required as pt's first time up and mildly lethargic. Once scooting to chair, pt was +1 assist.  Pt is accustomed to getting up with prosthetic which was not in room.      Balance Overall balance assessment: Needs assistance Sitting-balance support: Feet supported Sitting balance-Leahy Scale: Fair   Postural control: Posterior lean Standing balance support: During functional activity, Reliant on assistive device for balance, Bilateral upper extremity supported Standing balance-Leahy Scale: Poor Standing balance comment: must have outside support and needs prosthetic                           ADL either performed or assessed with clinical judgement   ADL Overall ADL's : Needs assistance/impaired Eating/Feeding: Moderate assistance;Sitting Eating/Feeding  Details (indicate cue type and reason): Pt on modified diet needing cues to slow down, take small bites and sips and min assist to keep food on the fork. Grooming: Oral care;Moderate assistance;Sitting   Upper Body Bathing: Minimal assistance;Sitting   Lower Body Bathing: Maximal assistance;Sitting/lateral  leans;Cueing for compensatory techniques   Upper Body Dressing : Minimal assistance;Sitting   Lower Body Dressing: Maximal assistance;Sitting/lateral leans;Cueing for compensatory techniques   Toilet Transfer: Moderate assistance;Squat-pivot;BSC/3in1   Toileting- Clothing Manipulation and Hygiene: Maximal assistance;Sitting/lateral lean;Cueing for compensatory techniques       Functional mobility during ADLs: Moderate assistance General ADL Comments: Pt appears much improved from yesteday per chart but continues to need assist for all adls. Pt's LE prosthetic not here but should be here tomorrow.     Vision Baseline Vision/History: 0 No visual deficits Ability to See in Adequate Light: 0 Adequate Patient Visual Report: No change from baseline Vision Assessment?: No apparent visual deficits     Perception         Praxis         Pertinent Vitals/Pain Pain Assessment Faces Pain Scale: No hurt     Extremity/Trunk Assessment Upper Extremity Assessment Upper Extremity Assessment: Overall WFL for tasks assessed   Lower Extremity Assessment Lower Extremity Assessment: Defer to PT evaluation   Cervical / Trunk Assessment Cervical / Trunk Assessment: Normal   Communication Communication Communication: Impaired Factors Affecting Communication: Hearing impaired   Cognition Arousal: Lethargic Behavior During Therapy: Flat affect Cognition: Cognition impaired   Orientation impairments: Place Awareness: Online awareness impaired Memory impairment (select all impairments): Short-term memory Attention impairment (select first level of impairment): Focused attention Executive functioning impairment (select all impairments): Problem solving, Sequencing, Reasoning, Organization OT - Cognition Comments: Pt appears to be improving with cognition.  Improved just in the matter for the session when OOB and sitting up.                 Following commands: Intact        Cueing  General Comments   Cueing Techniques: Verbal cues  Pt making good improvements just during the OT eval/ session.  Need to see if pt can have increased support at home just for the initial d/c time home.   Exercises     Shoulder Instructions      Home Living Family/patient expects to be discharged to:: Private residence Living Arrangements: Alone;Other (Comment) (wife lives in [place]) Available Help at Discharge: Available PRN/intermittently Type of Home: Apartment Home Access: Level entry     Home Layout: One level     Bathroom Shower/Tub: Tub/shower unit;Curtain   Firefighter: Handicapped height Bathroom Accessibility: Yes How Accessible: Accessible via wheelchair Home Equipment: Rollator (4 wheels);Wheelchair - manual;Tub bench;BSC/3in1   Additional Comments: pt does not drive. Aid comes 2x week for 4 hours to assist with cleaning etc. Neice takes pt to get food.      Prior Functioning/Environment Prior Level of Function : Independent/Modified Independent             Mobility Comments: uses prosthetic most of time but occasionally mobilizes with w/c. Apartment is accessible. ADLs Comments: mod I PTA other than driving.    OT Problem List: Impaired balance (sitting and/or standing);Decreased cognition;Decreased safety awareness;Decreased knowledge of use of DME or AE   OT Treatment/Interventions: Self-care/ADL training;DME and/or AE instruction;Therapeutic activities;Balance training      OT Goals(Current goals can be found in the care plan section)   Acute Rehab OT Goals Patient Stated  Goal: none stated OT Goal Formulation: With patient/family Time For Goal Achievement: 10/06/23 Potential to Achieve Goals: Good ADL Goals Pt Will Perform Lower Body Bathing: with supervision;sit to/from stand Pt Will Perform Lower Body Dressing: with supervision;sit to/from stand Pt Will Transfer to Toilet: with supervision;ambulating;regular height toilet Pt  Will Perform Toileting - Clothing Manipulation and hygiene: with supervision;sit to/from stand Pt Will Perform Tub/Shower Transfer: Tub transfer;with supervision;tub bench;grab bars;Squat pivot transfer Additional ADL Goal #1: Pt will answer simple problem solvign questions with 100% accuracy.   OT Frequency:  Min 2X/week    Co-evaluation              AM-PAC OT "6 Clicks" Daily Activity     Outcome Measure Help from another person eating meals?: A Lot Help from another person taking care of personal grooming?: A Lot Help from another person toileting, which includes using toliet, bedpan, or urinal?: A Lot Help from another person bathing (including washing, rinsing, drying)?: A Lot Help from another person to put on and taking off regular upper body clothing?: A Lot Help from another person to put on and taking off regular lower body clothing?: A Lot 6 Click Score: 12   End of Session Equipment Utilized During Treatment: Rolling walker (2 wheels) Nurse Communication: Mobility status  Activity Tolerance: Patient tolerated treatment well Patient left: in chair;with call bell/phone within reach;with chair alarm set  OT Visit Diagnosis: Unsteadiness on feet (R26.81);Other abnormalities of gait and mobility (R26.89);Other symptoms and signs involving cognitive function                Time: 1610-9604 OT Time Calculation (min): 50 min Charges:  OT General Charges $OT Visit: 1 Visit OT Evaluation $OT Eval Moderate Complexity: 1 Mod OT Treatments $Self Care/Home Management : 23-37 mins  Carla Charon 09/22/2023, 10:58 AM

## 2023-09-22 NOTE — Progress Notes (Signed)
   Patient Name: Cory [name] Date of Encounter: 09/22/2023 [facility] Cardiologist: Desarae Placide K Lanie Schelling, MD   Interval Summary  .    No acute events.  Remains afebrile  More conversant this AM   Vital Signs .    Vitals:   09/22/23 0352 09/22/23 0400 09/22/23 0500 09/22/23 0801  BP:  (!) 152/105 (!) 152/103   Pulse:  91 92   Resp:  19 18   Temp: 99.4 F (37.4 C)   98.1 F (36.7 C)  TempSrc: Oral   Axillary  SpO2:  100% 100%   Weight:   87.5 kg   Height:        Intake/Output Summary (Last 24 hours) at 09/22/2023 0949 Last data filed at 09/22/2023 0400 Gross per 24 hour  Intake 1180 ml  Output 4575 ml  Net -3395 ml      09/22/2023    5:00 AM 09/21/2023   11:25 AM 09/21/2023    7:05 AM  Last 3 Weights  Weight (lbs) 192 lb 14.4 oz 192 lb 0.3 oz 191 lb 9.3 oz  Weight (kg) 87.5 kg 87.1 kg 86.9 kg      Telemetry/ECG    Atrial flutter with variable conduction- Personally Reviewed  Physical Exam .   GEN: No acute distress.   Neuro:  Grossly intact Neck: No JVD Cardiac:  Irregular, no murmurs, rubs, or gallops.  Respiratory: Clear to auscultation bilaterally. GI: Soft, nontender, non-distended  MS: No edema; warm and well perfused  Assessment & Plan .     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; likely not related to AFL (this has been a longstanding issue)  Afterload high today Increase to Coreg  25 mg Still 1.6L up during admission; give lasix  40mg  IV x 1 today  Hold on ARB/Entresto/spiro given AKI (Cr improving)  Start BiDil  20/37.5 TID  Consider Jardiance later this admission  -Atrial flutter  Rate controlled Increase Coreg  as above  Continue Eliquis  5 bid -HL  Holding statin given elevated CK  -AKI/CKD  Cr improving, continue to monitor  -Metabolic encephalopathy  -Per primary team  -EEG with diffuse slowing, no seizures seen as of yet  -Patient sensorium/communication  improving  -Sepsis  -On abx per primary team  CRITICAL CARE Performed by: Jean Alejos K Champion Corales   Total critical care time: 30 minutes  Critical care time was exclusive of separately billable procedures and treating other patients.  Critical care was necessary to treat or prevent imminent or life-threatening deterioration.  Critical care was time spent personally by me on the following activities: development of treatment plan with patient and/or surrogate as well as nursing, discussions with consultants, evaluation of patient's response to treatment, examination of patient, obtaining history from patient or surrogate, ordering and performing treatments and interventions, ordering and review of laboratory studies, ordering and review of radiographic studies, pulse oximetry and re-evaluation of patient's condition.  For questions or updates, please contact [facility] Please consult [email or web address] for contact info under        Signed, Hamsini Verrilli K Madelena Maturin, MD

## 2023-09-22 NOTE — Progress Notes (Signed)
 PROGRESS NOTE  Cory [name]    DOB: [birth year], 78 y.o.  ZHY:[id number]    Code Status: Full Code   DOA: 09/18/2023   LOS: 4   Brief hospital course  Cory [name] is a 78 y.o. male with a PMH significant for DM, CKD, colon cancer in splenic flexure, a flutter on Eliquis , HTN, previous seizures secondary to hyperglycemia, previous encephalopathy, BKA in 2022, previous CVA who lives at home by himself and was found down by caregiver after unknown time. hematoma to left forehead therefore level 1 trauma activated. GCS 5 on arrival with pill rolling motions noted in BUEs. Intubated for airway protection. Workup with CT head, chest, abd/pelvis negative for traumatic injuries. Labs noted for glucose 437, BUN/ sCr 26/ 2.25, t. Bili 1.3, albumin 3.4, CK 621, lactic 2.2, INR 1.3, H/H 13.9/ 42.  5/12 Admitted, intubated and sedated 5/13 EEG neg 5/14 extubated. No sz on eeg   09/22/23 -improving, continues to work with therapies for dispo planning  Assessment & Plan  Principal Problem:   Acute encephalopathy Active Problems:   Pressure injury of skin  Acute metabolic encephalopathy Fall on eliquis   L scalp hematoma  Hx prior CVA Dysphagia  - previous hx of seizures in 04/2023 felt 2/2 to DKA - CTH neg, UDS neg, Ammonia neg - spot EEG 5/12> severe diffuse encephalopathy, no seizures/ epileptiform discharges. No sz captured on cEEG through 5/14  Holding seizure ppx currently -dys 3 diet, SLP following   Acute respiratory failure w hypoxia- currently on room air at 98% Severe sepsis 2/2 serratia PNA  Volume overload  - continue 7 day antibiotic course -follow Bcx    AKI on CKD 3a, improving Cr 2.87>>>2.46 Elevated CK, resolved Hypokalemia  resolved   DM2 w hyperglycemia, poorly controlled  -SSI - increasing long acting and transitioning to morning dosing for better safety profile   Aflutter on eliquis  Acute systolic HF HTN HLD  -metop per cards  -eliquis   -statin on hold w  elevated CK, restart at dc if appropriate    Acute anemia Fe deficiency -Fe supplementation -PRN CBC    Colon Cancer of splenic flexure - followed by Dr. [name], previous surgery in January put off given December hospitalization with plans to reschedule soon.  Will be postponed again given hospitalization.    Stage 3 pressure injury coccyx/ sacrum- POA - per WOC recommendations  Body mass index is 26.9 kg/m.  VTE ppx: Place and maintain sequential compression device Start: 09/18/23 1549 apixaban  (ELIQUIS ) tablet 5 mg   Diet:     Diet   DIET DYS 3 Room service appropriate? Yes with Assist; Fluid consistency: Thin   Consultants: Cardiology  CCM  Subjective 09/22/23    Pt reports doing well. Denies SOB or pain. Currently working with SLP and OT   Objective   Vitals:   09/22/23 0300 09/22/23 0352 09/22/23 0400 09/22/23 0500  BP: (!) 162/100  (!) 152/105 (!) 152/103  Pulse: 95  91 92  Resp: (!) 24  19 18   Temp:  99.4 F (37.4 C)    TempSrc:  Oral    SpO2: 100%  100% 100%  Weight:    87.5 kg  Height:        Intake/Output Summary (Last 24 hours) at 09/22/2023 0743 Last data filed at 09/22/2023 0400 Gross per 24 hour  Intake 1180 ml  Output 4725 ml  Net -3545 ml   Filed Weights   09/21/23 0705 09/21/23 1125 09/22/23 0500  Weight: 86.9  kg 87.1 kg 87.5 kg     Physical Exam:  General: awake, alert, NAD HEENT: atraumatic, clear conjunctiva, anicteric sclera, MMM, hearing grossly normal Respiratory: normal respiratory effort. Cardiovascular: quick capillary refill Nervous: A&O x3. no gross focal neurologic deficits, slow speech Extremities: moves all equally, no edema, normal tone Skin: dry, intact, normal temperature, normal color. No rashes, lesions or ulcers on exposed skin Psychiatry: normal mood, congruent affect  Labs   I have personally reviewed the following labs and imaging studies CBC    Component Value Date/Time   WBC 5.7 09/22/2023 0445   RBC  4.25 09/22/2023 0445   HGB 10.7 (L) 09/22/2023 0445   HCT 33.5 (L) 09/22/2023 0445   PLT 157 09/22/2023 0445   MCV 78.8 (L) 09/22/2023 0445   MCH 25.2 (L) 09/22/2023 0445   MCHC 31.9 09/22/2023 0445   RDW 15.1 09/22/2023 0445      Latest Ref Rng & Units 09/22/2023    4:45 AM 09/21/2023    5:17 PM 09/21/2023    2:11 AM  BMP  Glucose 70 - 99 mg/dL 161  096  045   BUN 8 - 23 mg/dL 39  46  48   Creatinine 0.61 - 1.24 mg/dL 4.09  8.11  9.14   Sodium 135 - 145 mmol/L 145  145  144   Potassium 3.5 - 5.1 mmol/L 3.7  4.2  3.6   Chloride 98 - 111 mmol/L 111  111  110   CO2 22 - 32 mmol/L 24  24  24    Calcium 8.9 - 10.3 mg/dL 8.7  8.7  8.3     DG Chest 1 View Result Date: 09/20/2023 CLINICAL DATA:  Acute hypoxic respiratory failure EXAM: CHEST  1 VIEW COMPARISON:  09/18/2023 FINDINGS: Endotracheal tube tip 1.9 cm above the carina. Nasogastric tube terminates in the stomach. Atheromatous vascular calcifications of the thoracic aorta. Thoracic spondylosis. Heart size within normal limits. The lungs appear clear. IMPRESSION: 1. Endotracheal tube tip 1.9 cm above the carina. 2. Nasogastric tube terminates in the stomach. 3. Thoracic spondylosis. Electronically Signed   By: Freida Jes M.D.   On: 09/20/2023 14:39   Overnight EEG with video Result Date: 09/20/2023 Arleene Lack, MD     09/21/2023 10:12 AM Patient Name: Cory [name] MRN: [id number] Epilepsy Attending: Arleene Lack Referring Physician/Provider: Imogene Mana, NP Duration: 09/19/2023 1917 to 09/20/2023 1917 Patient history: 78yo M with h/o seizures came with fall. EEG to evaluate for seizure  Level of alertness: comatose  AEDs during EEG study: propofol , LEV  Technical aspects: This EEG study was done with scalp electrodes positioned according to the 10-20 International system of electrode placement. Electrical activity was reviewed with band pass filter of 1-70Hz , sensitivity of 7 uV/mm, display speed of 64mm/sec with a 60Hz   notched filter applied as appropriate. EEG data were recorded continuously and digitally stored.  Video monitoring was available and reviewed as appropriate.  Description: EEG showed continuous generalized 3-5hz  theta- delta slowing. Hyperventilation and photic stimulation were not performed.    ABNORMALITY - Continuous slow, generalized  IMPRESSION: This study is suggestive of severe diffuse encephalopathy. No seizures or epileptiform discharges were seen throughout the recording.   Priyanka O Yadav     Disposition Plan & Communication  Patient status: Inpatient  Admitted From: Home Planned disposition location: Rehabilitation facility Anticipated discharge date: 5/19 pending dispo planning  Family Communication: niece at bedside    Author: Ree Candy, DO [facility] 09/22/2023, 7:43  AM   Available by Epic secure chat 7AM-7PM. If 7PM-7AM, please contact night-coverage.  [facility] contact information found on [email or web address].

## 2023-09-22 NOTE — Evaluation (Addendum)
 Physical Therapy Evaluation Patient Details Name: Cory [name] MRN: [id number] DOB: 1945/06/01 Today's Date: 09/22/2023  History of Present Illness  Pt is a 78 yo male found down by his aid and found to have severe encephalopathy, DKA, CRF. CT with no large findings. PMH: sz d/o, RBKA 2022, aflutter, CKD, colon CA, prostate, CA, DM, HTN, CVA, R pupil fixed adn dialated chronically.  Clinical Impression  Pt admitted with above diagnosis. Pt was able to pivot to bed from chair with mod assist +2 as pt was fatigued and still confused as well.  Oriented to self only.  Pt lethargic as well. HR 112-139 bpm; 92% RA, BP 100/77 initially and 131/99 end of session with HR elevated at 139 bpm. Pt may need post acute rehab > 3 hours day prior to d/c home. Will follow acutely.  Pt currently with functional limitations due to the deficits listed below (see PT Problem List). Pt will benefit from acute skilled PT to increase their independence and safety with mobility to allow discharge.           If plan is discharge home, recommend the following: A little help with bathing/dressing/bathroom;Assistance with cooking/housework;Assist for transportation;Help with stairs or ramp for entrance   Can travel by private vehicle        Equipment Recommendations None recommended by PT  Recommendations for Other Services  Rehab consult    Functional Status Assessment Patient has had a recent decline in their functional status and demonstrates the ability to make significant improvements in function in a reasonable and predictable amount of time.     Precautions / Restrictions Precautions Precautions: Fall;Other (comment) Recall of Precautions/Restrictions: Impaired Precaution/Restrictions Comments: Pt with prosthetic that neice will bring in on 09/23/23 Required Braces or Orthoses: Other Brace Other Brace: RLE prosthetic Restrictions Weight Bearing Restrictions Per Provider Order: No      Mobility  Bed  Mobility Overal bed mobility: Needs Assistance Bed Mobility: Sit to Supine       Sit to supine: Max assist, +2 for physical assistance   General bed mobility comments: Needed assist for LEs and for lowering trunk    Transfers Overall transfer level: Needs assistance Equipment used: 2 person hand held assist Transfers: Sit to/from Stand, Bed to chair/wheelchair/BSC Sit to Stand: Mod assist, +2 physical assistance     Squat pivot transfers: Mod assist, +2 physical assistance     General transfer comment: +2 assist required due to pt still lethargic. Pt also with fatigue after sitting up large part of day. Pt is accustomed to getting up with prosthetic which was not in room.    Ambulation/Gait                  Stairs            Wheelchair Mobility     Tilt Bed    Modified Rankin (Stroke Patients Only)       Balance Overall balance assessment: Needs assistance Sitting-balance support: Feet supported Sitting balance-Leahy Scale: Fair   Postural control: Posterior lean Standing balance support: During functional activity, Reliant on assistive device for balance, Bilateral upper extremity supported Standing balance-Leahy Scale: Poor Standing balance comment: must have outside support and needs prosthetic                             Pertinent Vitals/Pain Pain Assessment Pain Assessment: No/denies pain Faces Pain Scale: No hurt    Home Living Family/patient expects  to be discharged to:: Private residence Living Arrangements: Alone;Other (Comment) (wife lives in [place]) Available Help at Discharge: Available PRN/intermittently Type of Home: Apartment Home Access: Level entry       Home Layout: One level Home Equipment: Rollator (4 wheels);Wheelchair - manual;Tub bench;BSC/3in1 Additional Comments: pt does not drive. Aid comes 2x week for 4 hours to assist with cleaning etc. Neice takes pt to get food.    Prior Function Prior Level of  Function : Independent/Modified Independent             Mobility Comments: uses prosthetic most of time but occasionally mobilizes with w/c. Apartment is accessible. ADLs Comments: mod I PTA other than driving.     Extremity/Trunk Assessment   Upper Extremity Assessment Upper Extremity Assessment: Defer to OT evaluation    Lower Extremity Assessment Lower Extremity Assessment: Generalized weakness    Cervical / Trunk Assessment Cervical / Trunk Assessment: Normal  Communication   Communication Communication: Impaired Factors Affecting Communication: Hearing impaired    Cognition Arousal: Lethargic Behavior During Therapy: Flat affect                             Following commands: Intact       Cueing Cueing Techniques: Verbal cues     General Comments      Exercises     Assessment/Plan    PT Assessment Patient needs continued PT services  PT Problem List Decreased activity tolerance;Decreased balance;Decreased mobility;Decreased knowledge of use of DME;Decreased safety awareness;Decreased knowledge of precautions       PT Treatment Interventions DME instruction;Gait training;Functional mobility training;Therapeutic activities;Therapeutic exercise;Balance training;Neuromuscular re-education;Patient/family education    PT Goals (Current goals can be found in the Care Plan section)  Acute Rehab PT Goals Patient Stated Goal: to go home PT Goal Formulation: With patient Time For Goal Achievement: 10/06/23 Potential to Achieve Goals: Good    Frequency Min 2X/week     Co-evaluation               AM-PAC PT "6 Clicks" Mobility  Outcome Measure Help needed turning from your back to your side while in a flat bed without using bedrails?: Total Help needed moving from lying on your back to sitting on the side of a flat bed without using bedrails?: Total Help needed moving to and from a bed to a chair (including a wheelchair)?: Total Help  needed standing up from a chair using your arms (e.g., wheelchair or bedside chair)?: Total Help needed to walk in hospital room?: Total Help needed climbing 3-5 steps with a railing? : Total 6 Click Score: 6    End of Session Equipment Utilized During Treatment: Gait belt Activity Tolerance: Patient limited by fatigue Patient left: in bed;with call bell/phone within reach;with bed alarm set Nurse Communication: Mobility status PT Visit Diagnosis: Muscle weakness (generalized) (M62.81)    Time: 4098-1191 PT Time Calculation (min) (ACUTE ONLY): 19 min   Charges:   PT Evaluation $PT Eval Moderate Complexity: 1 Mod   PT General Charges $$ ACUTE PT VISIT: 1 Visit         Krysia Zahradnik M,PT Acute Rehab Services [phone]   Florencia Hunter 09/22/2023, 3:18 PM

## 2023-09-22 NOTE — Telephone Encounter (Signed)
 Pharmacy Patient Advocate Encounter  Insurance verification completed.    The patient is insured through [employer]. Patient has Medicare and is not eligible for a copay card, but may be able to apply for patient assistance or Medicare RX Payment Plan (Patient Must reach out to their plan, if eligible for payment plan), if available.    Ran test claim for Bidil  and the current 30 day co-pay is $35.76.   This test claim was processed through [facility]- copay amounts may vary at other pharmacies due to pharmacy/plan contracts, or as the patient moves through the different stages of their insurance plan.

## 2023-09-23 DIAGNOSIS — I5021 Acute systolic (congestive) heart failure: Secondary | ICD-10-CM | POA: Diagnosis not present

## 2023-09-23 DIAGNOSIS — S0990XA Unspecified injury of head, initial encounter: Secondary | ICD-10-CM

## 2023-09-23 DIAGNOSIS — N179 Acute kidney failure, unspecified: Secondary | ICD-10-CM | POA: Diagnosis not present

## 2023-09-23 DIAGNOSIS — I5020 Unspecified systolic (congestive) heart failure: Secondary | ICD-10-CM | POA: Diagnosis not present

## 2023-09-23 DIAGNOSIS — J690 Pneumonitis due to inhalation of food and vomit: Secondary | ICD-10-CM

## 2023-09-23 DIAGNOSIS — I4892 Unspecified atrial flutter: Secondary | ICD-10-CM | POA: Diagnosis not present

## 2023-09-23 DIAGNOSIS — G9340 Encephalopathy, unspecified: Secondary | ICD-10-CM | POA: Diagnosis not present

## 2023-09-23 LAB — BASIC METABOLIC PANEL WITH GFR
Anion gap: 9 (ref 5–15)
BUN: 32 mg/dL — ABNORMAL HIGH (ref 8–23)
CO2: 25 mmol/L (ref 22–32)
Calcium: 8.2 mg/dL — ABNORMAL LOW (ref 8.9–10.3)
Chloride: 108 mmol/L (ref 98–111)
Creatinine, Ser: 2.32 mg/dL — ABNORMAL HIGH (ref 0.61–1.24)
GFR, Estimated: 28 mL/min — ABNORMAL LOW (ref >60)
Glucose, Bld: 235 mg/dL — ABNORMAL HIGH (ref 70–99)
Potassium: 3.5 mmol/L (ref 3.5–5.1)
Sodium: 142 mmol/L (ref 135–145)

## 2023-09-23 LAB — CBC
HCT: 29.8 % — ABNORMAL LOW (ref 39.0–52.0)
Hemoglobin: 9.8 g/dL — ABNORMAL LOW (ref 13.0–17.0)
MCH: 25.8 pg — ABNORMAL LOW (ref 26.0–34.0)
MCHC: 32.9 g/dL (ref 30.0–36.0)
MCV: 78.4 fL — ABNORMAL LOW (ref 80.0–100.0)
Platelets: 181 10*3/uL (ref 150–400)
RBC: 3.8 MIL/uL — ABNORMAL LOW (ref 4.22–5.81)
RDW: 14.9 % (ref 11.5–15.5)
WBC: 6.1 10*3/uL (ref 4.0–10.5)
nRBC: 0 % (ref 0.0–0.2)

## 2023-09-23 LAB — GLUCOSE, CAPILLARY
Glucose-Capillary: 191 mg/dL — ABNORMAL HIGH (ref 70–99)
Glucose-Capillary: 209 mg/dL — ABNORMAL HIGH (ref 70–99)
Glucose-Capillary: 209 mg/dL — ABNORMAL HIGH (ref 70–99)
Glucose-Capillary: 239 mg/dL — ABNORMAL HIGH (ref 70–99)
Glucose-Capillary: 249 mg/dL — ABNORMAL HIGH (ref 70–99)
Glucose-Capillary: 254 mg/dL — ABNORMAL HIGH (ref 70–99)

## 2023-09-23 MED ORDER — ISOSORB DINITRATE-HYDRALAZINE 20-37.5 MG PO TABS
2.0000 | ORAL_TABLET | Freq: Three times a day (TID) | ORAL | Status: DC
  Administered 2023-09-23 – 2023-09-29 (×17): 2 via ORAL
  Filled 2023-09-23 (×19): qty 2

## 2023-09-23 MED ORDER — QUETIAPINE FUMARATE 25 MG PO TABS
25.0000 mg | ORAL_TABLET | Freq: Every day | ORAL | Status: DC
  Administered 2023-09-23: 25 mg via ORAL
  Filled 2023-09-23: qty 1

## 2023-09-23 MED ORDER — SODIUM CHLORIDE 0.9 % IV SOLN
1.0000 g | Freq: Two times a day (BID) | INTRAVENOUS | Status: DC
  Administered 2023-09-23 – 2023-09-28 (×10): 1 g via INTRAVENOUS
  Filled 2023-09-23 (×10): qty 20

## 2023-09-23 NOTE — Progress Notes (Signed)
 [facility]                                                                               [place], is a 78 y.o. male, DOB - 07/04/1945, FAO:[id number] Admit date - 09/18/2023    Outpatient Primary MD for the patient is Cory [name], Cory [name]., MD  LOS - 5  days    Brief summary   Cory [name] is a 78 y.o. male with a PMH significant for DM, CKD, colon cancer in splenic flexure, a flutter on Eliquis , HTN, previous seizures secondary to hyperglycemia, previous encephalopathy, BKA in 2022, previous CVA who lives at home by himself and was found down by caregiver after unknown time.  He was found to have hematoma to left forehead therefore level 1 trauma activated. GCS 5 on arrival with pill rolling motions noted in BUEs. Intubated for airway protection. Workup with CT head, chest, abd/pelvis negative for traumatic injuries. Labs noted for glucose 437, BUN/ sCr 26/ 2.25, t. Bili 1.3, albumin 3.4, CK 621, lactic 2.2, INR 1.3, H/H 13.9/ 42.  5/12 Admitted, intubated and sedated 5/13 EEG neg 5/14 extubated. No sz on eeg . He was transferred to [facility] on 5/16.      Assessment & Plan    Assessment and Plan:  Acute metabolic encephalopathy  in the setting of prior CVA  From DKA , AKI and lactic acidosis.  UDS is negative. CT head negative. Ammonia level negative.  spot EEG 5/12> severe diffuse encephalopathy, no seizures/ epileptiform discharges. No sz captured on cEEG through 5/14  Slowly improving.  As per RN pt has bouts of delirium.  He is alert and oriented to person and place only. He was able to tell me the name and his birthday, and where he is .    Unwitnessed fall with left scalp hematoma.  Monitor for worsening symptoms.    Acute respiratory failure with hypoxia/ sepsis ? From aspiration.  Currently on Meropenam to complete the course.  Blood cultures pending.    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   Acute systolic CHF Echo showed LVEF of  25% to 30 % .  ? Stress cardiomyopathy.  On coreg  25 mg BID and Bidil  20/37.5 TID.   Holding the ARB/Entresto and spironolactone given AKI.    Atrial flutter On eliquis  5 mg BID.    Hyperlipidemia Holding statin for rhabdomyolysis.     Anemia of acute illness. / iron  deficiency anemia.  Microcytic , hemoglobin around 9.8  Iron  supplementation on board.   Type 2 DM with hyperglycemia CBG (last 3)  Recent Labs    09/23/23 0017 09/23/23 0343 09/23/23 0804  GLUCAP 239* 209* 209*   Poorly controlled. Patient insulin  increased to 20 units.  Currently on SSI.  A1c is   RN Pressure Injury Documentation: Pressure Injury 09/18/23 Sacrum Medial Stage 3 -  Full thickness tissue loss. Subcutaneous fat may be visible but bone, tendon or muscle are NOT exposed. Stg 3 (Active)  09/18/23 1439  Location: Sacrum  Location Orientation: Medial  Staging: Stage 3 -  Full thickness tissue loss. Subcutaneous fat may be visible but bone, tendon or muscle  are NOT exposed.  Wound Description (Comments): Stg 3  Present on Admission: Yes  Dressing Type Foam - Lift dressing to assess site every shift 09/23/23 0300  Foam dressing in place. Local wound care.   Malnutrition Type:  Nutrition Problem: Inadequate oral intake Etiology: acute illness   Malnutrition Characteristics:  Signs/Symptoms: NPO status   Nutrition Interventions:  Interventions: Ensure Enlive (each supplement provides 350kcal and 20 grams of protein), Other (Comment) (feeding assistance)  Estimated body mass index is 25.21 kg/m as calculated from the following:   Height as of this encounter: 5\' 11"  (1.803 m).   Weight as of this encounter: 82 kg.  Code Status: full code.  DVT Prophylaxis:  Place and maintain sequential compression device Start: 09/18/23 1549 apixaban  (ELIQUIS ) tablet 5 mg   Level of Care: Level of care: Progressive Family Communication: none at bedside.   Disposition Plan:     Remains inpatient  appropriate:  pending clinical improvement.    Procedures:  EEG Extubation.   Consultants:   Cardiology PCCM.   Antimicrobials:   Anti-infectives (From admission, onward)    Start     Dose/Rate Route Frequency Ordered Stop   09/22/23 1800  meropenem  (MERREM ) 500 mg in sodium chloride  0.9 % 100 mL IVPB        500 mg 200 mL/hr over 30 Minutes Intravenous Every 12 hours 09/22/23 1647     09/22/23 0930  ceFEPIme  (MAXIPIME ) 2 g in sodium chloride  0.9 % 100 mL IVPB  Status:  Discontinued        2 g 200 mL/hr over 30 Minutes Intravenous Every 24 hours 09/21/23 1048 09/22/23 1647   09/21/23 0930  ceFEPIme  (MAXIPIME ) 2 g in sodium chloride  0.9 % 100 mL IVPB  Status:  Discontinued        2 g 200 mL/hr over 30 Minutes Intravenous Every 24 hours 09/21/23 0834 09/21/23 1048   09/19/23 1015  cefTRIAXone  (ROCEPHIN ) 2 g in sodium chloride  0.9 % 100 mL IVPB  Status:  Discontinued        2 g 200 mL/hr over 30 Minutes Intravenous Every 24 hours 09/19/23 0923 09/21/23 0834        Medications  Scheduled Meds:  apixaban   5 mg Oral BID   carvedilol   12.5 mg Oral BID WC   Chlorhexidine  Gluconate Cloth  6 each Topical Daily   feeding supplement  237 mL Oral BID BM   ferrous sulfate   325 mg Oral BID WC   guaiFENesin   600 mg Oral Q12H   insulin  aspart  0-6 Units Subcutaneous TID WC   insulin  glargine-yfgn  20 Units Subcutaneous Daily   ipratropium  0.5 mg Nebulization BID   isosorbide -hydrALAZINE   1 tablet Oral TID   leptospermum manuka honey  1 Application Topical Daily   pantoprazole   40 mg Oral Daily   sodium chloride  HYPERTONIC  4 mL Nebulization BID   Continuous Infusions:  meropenem  (MERREM ) IV 500 mg (09/22/23 1726)   PRN Meds:.acetaminophen  (TYLENOL ) oral liquid 160 mg/5 mL, metoprolol  tartrate, mouth rinse, polyethylene glycol    Subjective:   Cory [name] was seen and examined today.  He is alert and oriented to person and place only. He was able to tell me the name and his  birthday, and where he is .  He appears comfortable, denies any chest pain or sob.  Objective:   Vitals:   09/23/23 0500 09/23/23 0600 09/23/23 0700 09/23/23 0806  BP: (!) 135/98 119/80 (!) 154/103   Pulse: Aaron Aas)  104 92 96   Resp: 18 18 17    Temp:    97.6 F (36.4 C)  TempSrc:    Oral  SpO2: 98% 97% 95%   Weight: 82 kg     Height:        Intake/Output Summary (Last 24 hours) at 09/23/2023 0840 Last data filed at 09/23/2023 0200 Gross per 24 hour  Intake 200 ml  Output 1850 ml  Net -1650 ml   Filed Weights   09/21/23 1125 09/22/23 0500 09/23/23 0500  Weight: 87.1 kg 87.5 kg 82 kg     Exam General exam: Appears calm and comfortable  Respiratory system: Clear to auscultation. Respiratory effort normal. Cardiovascular system: S1 & S2 heard, RRR. Gastrointestinal system: Abdomen is nondistended, soft and nontender. Central nervous system: Alert and oriented. No focal neurological deficits. Extremities: right BKA.  Skin: No rashes, Psychiatry: calm and comfortable.    Data Reviewed:  I have personally reviewed following labs and imaging studies   CBC Lab Results  Component Value Date   WBC 6.1 09/23/2023   RBC 3.80 (L) 09/23/2023   HGB 9.8 (L) 09/23/2023   HCT 29.8 (L) 09/23/2023   MCV 78.4 (L) 09/23/2023   MCH 25.8 (L) 09/23/2023   PLT 181 09/23/2023   MCHC 32.9 09/23/2023   RDW 14.9 09/23/2023     Last metabolic panel Lab Results  Component Value Date   NA 142 09/23/2023   K 3.5 09/23/2023   CL 108 09/23/2023   CO2 25 09/23/2023   BUN 32 (H) 09/23/2023   CREATININE 2.32 (H) 09/23/2023   GLUCOSE 235 (H) 09/23/2023   GFRNONAA 28 (L) 09/23/2023   CALCIUM 8.2 (L) 09/23/2023   PHOS 3.7 09/20/2023   PROT 5.8 (L) 09/19/2023   ALBUMIN 2.4 (L) 09/19/2023   BILITOT 0.7 09/19/2023   ALKPHOS 56 09/19/2023   AST 38 09/19/2023   ALT 21 09/19/2023   ANIONGAP 9 09/23/2023    CBG (last 3)  Recent Labs    09/23/23 0017 09/23/23 0343 09/23/23 0804  GLUCAP  239* 209* 209*      Coagulation Profile: Recent Labs  Lab 09/18/23 1038  INR 1.3*     Radiology Studies: No results found.     Feliciana Horn M.D. [facility] 09/23/2023, 8:40 AM  Available via Epic secure chat 7am-7pm After 7 pm, please refer to night coverage provider listed on amion.

## 2023-09-23 NOTE — Plan of Care (Signed)

## 2023-09-23 NOTE — Progress Notes (Signed)
 Progress Note  Patient Name: Cory [name] Date of Encounter: 09/23/2023  Primary Cardiologist: Arun K Thukkani, MD  Subjective   Continues to be confused.  Denies having any symptoms.  Inpatient Medications    Scheduled Meds:  apixaban   5 mg Oral BID   carvedilol   12.5 mg Oral BID WC   Chlorhexidine  Gluconate Cloth  6 each Topical Daily   feeding supplement  237 mL Oral BID BM   ferrous sulfate   325 mg Oral BID WC   guaiFENesin   600 mg Oral Q12H   insulin  aspart  0-6 Units Subcutaneous TID WC   insulin  glargine-yfgn  20 Units Subcutaneous Daily   ipratropium  0.5 mg Nebulization BID   isosorbide -hydrALAZINE   1 tablet Oral TID   leptospermum manuka honey  1 Application Topical Daily   pantoprazole   40 mg Oral Daily   Continuous Infusions:  meropenem  (MERREM ) IV 500 mg (09/22/23 1726)   PRN Meds: acetaminophen  (TYLENOL ) oral liquid 160 mg/5 mL, metoprolol  tartrate, mouth rinse, polyethylene glycol   Vital Signs    Vitals:   09/23/23 0600 09/23/23 0700 09/23/23 0806 09/23/23 1049  BP: 119/80 (!) 154/103    Pulse: 92 96    Resp: 18 17    Temp:   97.6 F (36.4 C)   TempSrc:   Oral   SpO2: 97% 95%  94%  Weight:      Height:        Intake/Output Summary (Last 24 hours) at 09/23/2023 1103 Last data filed at 09/23/2023 0200 Gross per 24 hour  Intake 200 ml  Output 1850 ml  Net -1650 ml   Filed Weights   09/21/23 1125 09/22/23 0500 09/23/23 0500  Weight: 87.1 kg 87.5 kg 82 kg    Telemetry     Personally reviewed.  Atrial flutter with intermittent RVR last night.  ECG    Not performed today.  Physical Exam   GEN: No acute distress.   Neck: No JVD. Cardiac: RRR, no murmur, rub, or gallop.  Respiratory: Nonlabored. Clear to auscultation bilaterally. GI: Soft, nontender, bowel sounds present. MS: No edema; No deformity. Neuro:  Nonfocal. Psych: Confused  Labs    Chemistry Recent Labs  Lab 09/18/23 1038 09/18/23 1206 09/19/23 0627  09/19/23 1657 09/21/23 1717 09/22/23 0445 09/23/23 0255  NA 138   < > 142   < > 145 145 142  K 4.0   < > 2.6*   < > 4.2 3.7 3.5  CL 99   < > 109   < > 111 111 108  CO2 23   < > 24   < > 24 24 25   GLUCOSE 437*   < > 72   < > 389* 233* 235*  BUN 26*   < > 29*   < > 46* 39* 32*  CREATININE 2.25*   < > 2.67*   < > 2.69* 2.46* 2.32*  CALCIUM 9.5   < > 8.6*   < > 8.7* 8.7* 8.2*  PROT 7.9  --  5.8*  --   --   --   --   ALBUMIN 3.4*  --  2.4*  --   --   --   --   AST 29  --  38  --   --   --   --   ALT 17  --  21  --   --   --   --   ALKPHOS 82  --  56  --   --   --   --  BILITOT 1.3*  --  0.7  --   --   --   --   GFRNONAA 29*   < > 24*   < > 24* 26* 28*  ANIONGAP 16*   < > 9   < > 10 10 9    < > = values in this interval not displayed.     Hematology Recent Labs  Lab 09/21/23 0211 09/22/23 0445 09/23/23 0255  WBC 7.4 5.7 6.1  RBC 3.89* 4.25 3.80*  HGB 10.2* 10.7* 9.8*  HCT 30.5* 33.5* 29.8*  MCV 78.4* 78.8* 78.4*  MCH 26.2 25.2* 25.8*  MCHC 33.4 31.9 32.9  RDW 15.2 15.1 14.9  PLT 132* 157 181    Cardiac Enzymes Recent Labs  Lab 09/18/23 1547 09/19/23 0910 09/19/23 1754  TROPONINIHS 115* 158* 135*    BNPNo results for input(s): "BNP", "PROBNP" in the last 168 hours.   DDimerNo results for input(s): "DDIMER" in the last 168 hours.   Radiology    No results found.   Assessment & Plan   Acute systolic heart failure: LVEF 25 to 30% on admission, likely stress-induced from sepsis. 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 Continue carvedilol  25 mg twice daily, BiDil  20-37.5 mg 3 times daily. Increase BiDil  dose, 2 tablets 3 times daily. Not a candidate for SGLT2 inhibitors due to amputation.  Avoiding ACE/ARB/Arni/MRA due to CKD stage IIIb.  Persistent atrial flutter: Intermittent RVR in the last 24 hours.  Continue carvedilol  25 mg twice daily and Eliquis  5 mg twice daily.  AKI on CKD stage IIIb: Serum creatinine  improving.  Acute metabolic encephalopathy: Continues to be confused.  Management per primary team.  Sepsis: On antibiotics per primary team.   CRITICAL CARE Performed by: Kennyth Pean Janavia Rottman   Total critical care time: 30 minutes  Critical care time was exclusive of separately billable procedures and treating other patients.  Critical care was necessary to treat or prevent imminent or life-threatening deterioration.  Critical care was time spent personally by me on the following activities: development of treatment plan with patient and/or surrogate as well as nursing, discussions with consultants, evaluation of patient's response to treatment, examination of patient, obtaining history from patient or surrogate, ordering and performing treatments and interventions, ordering and review of laboratory studies, ordering and review of radiographic studies, pulse oximetry and re-evaluation of patient's condition.   Signed, Lasalle Pointer, MD  09/23/2023, 11:03 AM

## 2023-09-23 NOTE — Progress Notes (Signed)
 PHARMACY NOTE:  ANTIMICROBIAL RENAL DOSAGE ADJUSTMENT  Current antimicrobial regimen includes a mismatch between antimicrobial dosage and estimated renal function.  As per policy approved by the Pharmacy & Therapeutics and Medical Executive Committees, the antimicrobial dosage will be adjusted accordingly.  Current antimicrobial dosage:  Merrem  500 IV every 12 horus  Indication: ESBL EColi and Serratia Pneumonia  Renal Function: Improving  Estimated Creatinine Clearance: 28.9 mL/min (A) (by C-G formula based on SCr of 2.32 mg/dL (H)). []      On intermittent HD, scheduled: []      On CRRT    Antimicrobial dosage has been changed to:  Merrem  1g IV every 12 hours  Additional comments:   Thank you for allowing pharmacy to be a part of this patient's care.  Lenard Quam, PharmD, BCPS, BCCCP Please refer to [facility] for [facility] numbers 09/23/2023 2:37 PM

## 2023-09-23 NOTE — Progress Notes (Addendum)
 Inpatient Rehab Admissions:  Inpatient Rehab Consult received.  Attempted to meet with patient at the bedside for rehabilitation assessment and to discuss goals and expectations of an inpatient rehab admission.  Pt appeared confused. NSG advised to contact pt's family. Spoke with pt's niece [name] on the telephone. Discussed average length of stay, insurance authorization requirement and discharge home after completion of CIR. [name] acknowledged understanding. [name] is supportive of pt pursuing CIR. [name] confirmed that pt would have intermittent support after discharge. Will discuss dispo with physiatrist to determine if CIR remains an appropriate rehab venue for pt. Will continue to follow.  Discussed pt's dispo with Dr. Rayleen Cal. Will follow pt's progress with therapy and confusion to help determine if CIR remains the appropriate rehab venue.  Signed: Artemus Larsen, MS, CCC-SLP Admissions Coordinator [phone]

## 2023-09-24 DIAGNOSIS — I5181 Takotsubo syndrome: Secondary | ICD-10-CM | POA: Diagnosis not present

## 2023-09-24 DIAGNOSIS — I5021 Acute systolic (congestive) heart failure: Secondary | ICD-10-CM

## 2023-09-24 DIAGNOSIS — N179 Acute kidney failure, unspecified: Secondary | ICD-10-CM

## 2023-09-24 DIAGNOSIS — I4892 Unspecified atrial flutter: Secondary | ICD-10-CM | POA: Diagnosis not present

## 2023-09-24 DIAGNOSIS — A419 Sepsis, unspecified organism: Secondary | ICD-10-CM

## 2023-09-24 DIAGNOSIS — G9340 Encephalopathy, unspecified: Secondary | ICD-10-CM | POA: Diagnosis not present

## 2023-09-24 LAB — GLUCOSE, CAPILLARY
Glucose-Capillary: 124 mg/dL — ABNORMAL HIGH (ref 70–99)
Glucose-Capillary: 133 mg/dL — ABNORMAL HIGH (ref 70–99)
Glucose-Capillary: 144 mg/dL — ABNORMAL HIGH (ref 70–99)
Glucose-Capillary: 154 mg/dL — ABNORMAL HIGH (ref 70–99)
Glucose-Capillary: 191 mg/dL — ABNORMAL HIGH (ref 70–99)

## 2023-09-24 LAB — BASIC METABOLIC PANEL WITH GFR
Anion gap: 9 (ref 5–15)
BUN: 18 mg/dL (ref 8–23)
CO2: 21 mmol/L — ABNORMAL LOW (ref 22–32)
Calcium: 7.8 mg/dL — ABNORMAL LOW (ref 8.9–10.3)
Chloride: 109 mmol/L (ref 98–111)
Creatinine, Ser: 1.8 mg/dL — ABNORMAL HIGH (ref 0.61–1.24)
GFR, Estimated: 39 mL/min — ABNORMAL LOW (ref >60)
Glucose, Bld: 134 mg/dL — ABNORMAL HIGH (ref 70–99)
Potassium: 3 mmol/L — ABNORMAL LOW (ref 3.5–5.1)
Sodium: 139 mmol/L (ref 135–145)

## 2023-09-24 LAB — MAGNESIUM: Magnesium: 2 mg/dL (ref 1.7–2.4)

## 2023-09-24 MED ORDER — POTASSIUM CHLORIDE CRYS ER 20 MEQ PO TBCR
40.0000 meq | EXTENDED_RELEASE_TABLET | Freq: Two times a day (BID) | ORAL | Status: AC
  Administered 2023-09-24 (×2): 40 meq via ORAL
  Filled 2023-09-24 (×2): qty 2

## 2023-09-24 MED ORDER — ORAL CARE MOUTH RINSE: 15.0000 mL | OROMUCOSAL | Status: DC | PRN

## 2023-09-24 NOTE — Progress Notes (Signed)
 [facility]                                                                               [place], is a 78 y.o. male, DOB - 07/26/1945, AOZ:[id number] Admit date - 09/18/2023    Outpatient Primary MD for the patient is Yvonnie Heritage, Lavonia Powers., MD  LOS - 6  days    Brief summary   Cory [name] is a 78 y.o. male with a PMH significant for DM, CKD, colon cancer in splenic flexure, a flutter on Eliquis , HTN, previous seizures secondary to hyperglycemia, previous encephalopathy, BKA in 2022, previous CVA who lives at home by himself and was found down by caregiver after unknown time.  He was found to have hematoma to left forehead therefore level 1 trauma activated. GCS 5 on arrival with pill rolling motions noted in BUEs. Intubated for airway protection. Workup with CT head, chest, abd/pelvis negative for traumatic injuries. Labs noted for glucose 437, BUN/ sCr 26/ 2.25, t. Bili 1.3, albumin 3.4, CK 621, lactic 2.2, INR 1.3, H/H 13.9/ 42.  5/12 Admitted, intubated and sedated 5/13 EEG neg 5/14 extubated. No sz on eeg . He was transferred to [facility] on 5/16.      Assessment & Plan    Assessment and Plan:  Acute metabolic encephalopathy  in the setting of prior CVA  From DKA , AKI and lactic acidosis.  UDS is negative. CT head negative. Ammonia level negative.  spot EEG 5/12> severe diffuse encephalopathy, no seizures/ epileptiform discharges. No sz captured on cEEG through 5/14  Mental status slowly improving.     Unwitnessed fall with left scalp hematoma.  Monitor for worsening symptoms.    Acute respiratory failure with hypoxia/ sepsis ? From aspiration.  Currently on Meropenam to complete the course.  Blood cultures pending.    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 Creatinine at 1.8 today.   Hypokalemia Replaced. Repeat levels in am.   Acute systolic CHF Echo showed LVEF of 25% to 30 % .  ? Stress cardiomyopathy.  On coreg  12.5 mg BID and  Bidil  20/37.5 TID.   Holding the ARB/Entresto and spironolactone given AKI.  Cardiology on board.    Atrial flutter On eliquis  5 mg BID.    Hyperlipidemia Holding statin for rhabdomyolysis.     Anemia of acute illness. / iron  deficiency anemia.  Microcytic , hemoglobin around 9.8  Iron  supplementation on board.   Type 2 DM with hyperglycemia CBG (last 3)  Recent Labs    09/24/23 0451 09/24/23 0858 09/24/23 1104  GLUCAP 154* 144* 133*   Poorly controlled. Patient insulin  increased to 20 units.  Currently on SSI.  A1c is 11.9  RN Pressure Injury Documentation: Pressure Injury 09/18/23 Sacrum Medial Stage 3 -  Full thickness tissue loss. Subcutaneous fat may be visible but bone, tendon or muscle are NOT exposed. Stg 3 (Active)  09/18/23 1439  Location: Sacrum  Location Orientation: Medial  Staging: Stage 3 -  Full thickness tissue loss. Subcutaneous fat may be visible but bone, tendon or muscle are NOT exposed.  Wound Description (Comments): Stg 3  Present on Admission: Yes  Dressing Type  Foam - Lift dressing to assess site every shift 09/23/23 2036  Foam dressing in place. Local wound care.   Malnutrition Type:  Nutrition Problem: Inadequate oral intake Etiology: acute illness   Malnutrition Characteristics:  Signs/Symptoms: NPO status   Nutrition Interventions:  Interventions: Ensure Enlive (each supplement provides 350kcal and 20 grams of protein), Other (Comment) (feeding assistance)  Estimated body mass index is 24.75 kg/m as calculated from the following:   Height as of this encounter: 5\' 11"  (1.803 m).   Weight as of this encounter: 80.5 kg.  Code Status: full code.  DVT Prophylaxis:  Place and maintain sequential compression device Start: 09/18/23 1549 apixaban  (ELIQUIS ) tablet 5 mg   Level of Care: Level of care: Progressive Family Communication: none at bedside.   Disposition Plan:     Remains inpatient appropriate:  pending clinical  improvement.    Procedures:  EEG Extubation.   Consultants:   Cardiology PCCM.   Antimicrobials:   Anti-infectives (From admission, onward)    Start     Dose/Rate Route Frequency Ordered Stop   09/23/23 2200  meropenem  (MERREM ) 1 g in sodium chloride  0.9 % 100 mL IVPB        1 g 200 mL/hr over 30 Minutes Intravenous Every 12 hours 09/23/23 1436     09/22/23 1800  meropenem  (MERREM ) 500 mg in sodium chloride  0.9 % 100 mL IVPB  Status:  Discontinued        500 mg 200 mL/hr over 30 Minutes Intravenous Every 12 hours 09/22/23 1647 09/23/23 1436   09/22/23 0930  ceFEPIme  (MAXIPIME ) 2 g in sodium chloride  0.9 % 100 mL IVPB  Status:  Discontinued        2 g 200 mL/hr over 30 Minutes Intravenous Every 24 hours 09/21/23 1048 09/22/23 1647   09/21/23 0930  ceFEPIme  (MAXIPIME ) 2 g in sodium chloride  0.9 % 100 mL IVPB  Status:  Discontinued        2 g 200 mL/hr over 30 Minutes Intravenous Every 24 hours 09/21/23 0834 09/21/23 1048   09/19/23 1015  cefTRIAXone  (ROCEPHIN ) 2 g in sodium chloride  0.9 % 100 mL IVPB  Status:  Discontinued        2 g 200 mL/hr over 30 Minutes Intravenous Every 24 hours 09/19/23 0923 09/21/23 0834        Medications  Scheduled Meds:  apixaban   5 mg Oral BID   carvedilol   12.5 mg Oral BID WC   Chlorhexidine  Gluconate Cloth  6 each Topical Daily   feeding supplement  237 mL Oral BID BM   ferrous sulfate   325 mg Oral BID WC   guaiFENesin   600 mg Oral Q12H   insulin  aspart  0-6 Units Subcutaneous TID WC   insulin  glargine-yfgn  20 Units Subcutaneous Daily   ipratropium  0.5 mg Nebulization BID   isosorbide -hydrALAZINE   2 tablet Oral TID   leptospermum manuka honey  1 Application Topical Daily   pantoprazole   40 mg Oral Daily   potassium chloride   40 mEq Oral BID   QUEtiapine  25 mg Oral QHS   Continuous Infusions:  meropenem  (MERREM ) IV 1 g (09/24/23 1102)   PRN Meds:.acetaminophen  (TYLENOL ) oral liquid 160 mg/5 mL, metoprolol  tartrate, mouth rinse,  polyethylene glycol    Subjective:   Ayodeji Keimig was seen and examined today. No new complaints.  Objective:   Vitals:   09/24/23 0752 09/24/23 0752 09/24/23 0906 09/24/23 1113  BP:  (!) 141/98  (!) 153/91  Pulse:  Aaron Aas)  108  93  Resp:  16  18  Temp:  98.1 F (36.7 C)  99.3 F (37.4 C)  TempSrc:  Axillary  Axillary  SpO2:  99% 98% 98%  Weight: 80.5 kg     Height:        Intake/Output Summary (Last 24 hours) at 09/24/2023 1124 Last data filed at 09/24/2023 0756 Gross per 24 hour  Intake 450 ml  Output 200 ml  Net 250 ml   Filed Weights   09/22/23 0500 09/23/23 0500 09/24/23 0752  Weight: 87.5 kg 82 kg 80.5 kg     Exam General exam: Appears calm and comfortable  Respiratory system: Clear to auscultation. Respiratory effort normal. Cardiovascular system: S1 & S2 heard, RRR. No JVD, Gastrointestinal system: Abdomen is nondistended, soft and nontender.  Central nervous system: Alert and oriented to person.  Extremities: right BKA.  Skin: No rashes,  Psychiatry: CALM.    Data Reviewed:  I have personally reviewed following labs and imaging studies   CBC Lab Results  Component Value Date   WBC 6.1 09/23/2023   RBC 3.80 (L) 09/23/2023   HGB 9.8 (L) 09/23/2023   HCT 29.8 (L) 09/23/2023   MCV 78.4 (L) 09/23/2023   MCH 25.8 (L) 09/23/2023   PLT 181 09/23/2023   MCHC 32.9 09/23/2023   RDW 14.9 09/23/2023     Last metabolic panel Lab Results  Component Value Date   NA 139 09/24/2023   K 3.0 (L) 09/24/2023   CL 109 09/24/2023   CO2 21 (L) 09/24/2023   BUN 18 09/24/2023   CREATININE 1.80 (H) 09/24/2023   GLUCOSE 134 (H) 09/24/2023   GFRNONAA 39 (L) 09/24/2023   CALCIUM 7.8 (L) 09/24/2023   PHOS 3.7 09/20/2023   PROT 5.8 (L) 09/19/2023   ALBUMIN 2.4 (L) 09/19/2023   BILITOT 0.7 09/19/2023   ALKPHOS 56 09/19/2023   AST 38 09/19/2023   ALT 21 09/19/2023   ANIONGAP 9 09/24/2023    CBG (last 3)  Recent Labs    09/24/23 0451 09/24/23 0858  09/24/23 1104  GLUCAP 154* 144* 133*      Coagulation Profile: Recent Labs  Lab 09/18/23 1038  INR 1.3*     Radiology Studies: No results found.     Feliciana Horn M.D. [facility] 09/24/2023, 11:24 AM  Available via Epic secure chat 7am-7pm After 7 pm, please refer to night coverage provider listed on amion.

## 2023-09-24 NOTE — Progress Notes (Signed)
 Progress Note  Patient Name: Cory [name] Date of Encounter: 09/24/2023  Primary Cardiologist: Arun K Thukkani, MD  Subjective   Continues to be confused.  No symptoms.  Inpatient Medications    Scheduled Meds:  apixaban   5 mg Oral BID   carvedilol   12.5 mg Oral BID WC   Chlorhexidine  Gluconate Cloth  6 each Topical Daily   feeding supplement  237 mL Oral BID BM   ferrous sulfate   325 mg Oral BID WC   guaiFENesin   600 mg Oral Q12H   insulin  aspart  0-6 Units Subcutaneous TID WC   insulin  glargine-yfgn  20 Units Subcutaneous Daily   ipratropium  0.5 mg Nebulization BID   isosorbide -hydrALAZINE   2 tablet Oral TID   leptospermum manuka honey  1 Application Topical Daily   pantoprazole   40 mg Oral Daily   potassium chloride   40 mEq Oral BID   Continuous Infusions:  meropenem  (MERREM ) IV 1 g (09/24/23 1102)   PRN Meds: acetaminophen  (TYLENOL ) oral liquid 160 mg/5 mL, metoprolol  tartrate, mouth rinse, polyethylene glycol   Vital Signs    Vitals:   09/24/23 0752 09/24/23 0752 09/24/23 0906 09/24/23 1113  BP:  (!) 141/98  (!) 153/91  Pulse:  (!) 108  93  Resp:  16  18  Temp:  98.1 F (36.7 C)  99.3 F (37.4 C)  TempSrc:  Axillary  Axillary  SpO2:  99% 98% 98%  Weight: 80.5 kg     Height:        Intake/Output Summary (Last 24 hours) at 09/24/2023 1210 Last data filed at 09/24/2023 0756 Gross per 24 hour  Intake 450 ml  Output 200 ml  Net 250 ml   Filed Weights   09/22/23 0500 09/23/23 0500 09/24/23 0752  Weight: 87.5 kg 82 kg 80.5 kg    Telemetry     Personally reviewed.  Atrial flutter with HR ranging from 80 to 100s.  ECG    Not performed today.  Physical Exam   GEN: No acute distress.   Neck: No JVD. Cardiac: RRR, no murmur, rub, or gallop.  Respiratory: Nonlabored. Clear to auscultation bilaterally. GI: Soft, nontender, bowel sounds present. MS: No edema; No deformity. Neuro:  Nonfocal. Psych: Confused  Labs    Chemistry Recent Labs   Lab 09/18/23 1038 09/18/23 1206 09/19/23 1610 09/19/23 1657 09/22/23 0445 09/23/23 0255 09/24/23 0925  NA 138   < > 142   < > 145 142 139  K 4.0   < > 2.6*   < > 3.7 3.5 3.0*  CL 99   < > 109   < > 111 108 109  CO2 23   < > 24   < > 24 25 21*  GLUCOSE 437*   < > 72   < > 233* 235* 134*  BUN 26*   < > 29*   < > 39* 32* 18  CREATININE 2.25*   < > 2.67*   < > 2.46* 2.32* 1.80*  CALCIUM 9.5   < > 8.6*   < > 8.7* 8.2* 7.8*  PROT 7.9  --  5.8*  --   --   --   --   ALBUMIN 3.4*  --  2.4*  --   --   --   --   AST 29  --  38  --   --   --   --   ALT 17  --  21  --   --   --   --  ALKPHOS 82  --  56  --   --   --   --   BILITOT 1.3*  --  0.7  --   --   --   --   GFRNONAA 29*   < > 24*   < > 26* 28* 39*  ANIONGAP 16*   < > 9   < > 10 9 9    < > = values in this interval not displayed.     Hematology Recent Labs  Lab 09/21/23 0211 09/22/23 0445 09/23/23 0255  WBC 7.4 5.7 6.1  RBC 3.89* 4.25 3.80*  HGB 10.2* 10.7* 9.8*  HCT 30.5* 33.5* 29.8*  MCV 78.4* 78.8* 78.4*  MCH 26.2 25.2* 25.8*  MCHC 33.4 31.9 32.9  RDW 15.2 15.1 14.9  PLT 132* 157 181    Cardiac Enzymes Recent Labs  Lab 09/18/23 1547 09/19/23 0910 09/19/23 1754  TROPONINIHS 115* 158* 135*    BNPNo results for input(s): "BNP", "PROBNP" in the last 168 hours.   DDimerNo results for input(s): "DDIMER" in the last 168 hours.   Radiology    No results found.   Assessment & Plan   Acute systolic heart failure: LVEF 25 to 30% on admission, likely stress-induced from sepsis. 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  Currently on carvedilol  12.5 mg twice daily (dose decreased from yesterday), BiDil  20-37.5 mg 3 times daily (2 tablets).  Not a candidate for SGLT2 inhibitors due to amputation.Avoiding ACE/ARB/Arni/MRA due to CKD stage IIIb.  Persistent atrial flutter: Telemetry reviewed, continues to be in atrial flutter, HR ranging from 80 to 100s.  Continue  carvedilol  12.5 mg twice daily and Eliquis  5 mg twice daily.  Lenient HR control in the setting of infection.  Goal HR less than 120 bpm.  AKI on CKD stage IIIb: Serum creatinine improving.  Acute metabolic encephalopathy: Continues to be confused, management per primary team.  Sepsis: On antibiotics per primary team.   Signed, Lasalle Pointer, MD  09/24/2023, 12:10 PM

## 2023-09-25 DIAGNOSIS — R5381 Other malaise: Secondary | ICD-10-CM | POA: Diagnosis not present

## 2023-09-25 DIAGNOSIS — I5020 Unspecified systolic (congestive) heart failure: Secondary | ICD-10-CM | POA: Diagnosis not present

## 2023-09-25 DIAGNOSIS — I5021 Acute systolic (congestive) heart failure: Secondary | ICD-10-CM | POA: Diagnosis not present

## 2023-09-25 DIAGNOSIS — I5181 Takotsubo syndrome: Secondary | ICD-10-CM | POA: Diagnosis not present

## 2023-09-25 DIAGNOSIS — G9340 Encephalopathy, unspecified: Secondary | ICD-10-CM | POA: Diagnosis not present

## 2023-09-25 DIAGNOSIS — I4892 Unspecified atrial flutter: Secondary | ICD-10-CM | POA: Diagnosis not present

## 2023-09-25 LAB — GLUCOSE, CAPILLARY
Glucose-Capillary: 351 mg/dL — ABNORMAL HIGH (ref 70–99)
Glucose-Capillary: 312 mg/dL — ABNORMAL HIGH (ref 70–99)
Glucose-Capillary: 351 mg/dL — ABNORMAL HIGH (ref 70–99)

## 2023-09-25 LAB — CULTURE, BLOOD (ROUTINE X 2)
Culture: NO GROWTH
Culture: NO GROWTH

## 2023-09-25 LAB — BASIC METABOLIC PANEL WITH GFR
Anion gap: 6 (ref 5–15)
BUN: 21 mg/dL (ref 8–23)
CO2: 23 mmol/L (ref 22–32)
Calcium: 8 mg/dL — ABNORMAL LOW (ref 8.9–10.3)
Chloride: 112 mmol/L — ABNORMAL HIGH (ref 98–111)
Creatinine, Ser: 1.97 mg/dL — ABNORMAL HIGH (ref 0.61–1.24)
GFR, Estimated: 35 mL/min — ABNORMAL LOW (ref >60)
Glucose, Bld: 128 mg/dL — ABNORMAL HIGH (ref 70–99)
Potassium: 3.9 mmol/L (ref 3.5–5.1)
Sodium: 141 mmol/L (ref 135–145)

## 2023-09-25 LAB — CBC WITH DIFFERENTIAL/PLATELET
Abs Immature Granulocytes: 0.06 10*3/uL (ref 0.00–0.07)
Basophils Absolute: 0 10*3/uL (ref 0.0–0.1)
Basophils Relative: 1 %
Eosinophils Absolute: 0.4 10*3/uL (ref 0.0–0.5)
Eosinophils Relative: 6 %
HCT: 32 % — ABNORMAL LOW (ref 39.0–52.0)
Hemoglobin: 10.6 g/dL — ABNORMAL LOW (ref 13.0–17.0)
Immature Granulocytes: 1 %
Lymphocytes Relative: 21 %
Lymphs Abs: 1.3 10*3/uL (ref 0.7–4.0)
MCH: 25.7 pg — ABNORMAL LOW (ref 26.0–34.0)
MCHC: 33.1 g/dL (ref 30.0–36.0)
MCV: 77.5 fL — ABNORMAL LOW (ref 80.0–100.0)
Monocytes Absolute: 0.8 10*3/uL (ref 0.1–1.0)
Monocytes Relative: 13 %
Neutro Abs: 3.6 10*3/uL (ref 1.7–7.7)
Neutrophils Relative %: 58 %
Platelets: 239 10*3/uL (ref 150–400)
RBC: 4.13 MIL/uL — ABNORMAL LOW (ref 4.22–5.81)
RDW: 14.6 % (ref 11.5–15.5)
WBC: 6.1 10*3/uL (ref 4.0–10.5)
nRBC: 0 % (ref 0.0–0.2)

## 2023-09-25 MED ORDER — ORAL CARE MOUTH RINSE: 15.0000 mL | OROMUCOSAL | Status: DC | PRN

## 2023-09-25 MED ORDER — CARVEDILOL 12.5 MG PO TABS
25.0000 mg | ORAL_TABLET | Freq: Two times a day (BID) | ORAL | Status: DC
  Administered 2023-09-25 – 2023-09-28 (×6): 25 mg via ORAL
  Filled 2023-09-25 (×7): qty 2

## 2023-09-25 NOTE — Progress Notes (Signed)
 Yellow MEWS triggered during ambulation (two points for HR). PT has been A. Fib/A. Flutter today. Had been given his morning meds (including beta blocker) shortly before activity. Once session was completed HR decreased with rest. PT appeared in no acute distress. PT's Coreg  dose adjusted by Dr. Akula. Follow up V/S showed a green MEWS. No further interventions.     09/25/23 1251  Assess: MEWS Score  Temp 99.6 F (37.6 C)  BP (!) 121/57  MAP (mmHg) 68  Pulse Rate (!) 140  ECG Heart Rate (!) 143  Resp 19  SpO2 99 %  O2 Device Room Air  Patient Activity (if Appropriate) Ambulating  Assess: MEWS Score  MEWS Temp 0  MEWS Systolic 0  MEWS Pulse 3  MEWS RR 0  MEWS LOC 0  MEWS Score 3  MEWS Score Color Yellow  Assess: if the MEWS score is Yellow or Red  Were vital signs accurate and taken at a resting state? No, vital signs rechecked  Does the patient meet 2 or more of the SIRS criteria? No  Notify: Charge Nurse/RN  Name of Charge Nurse/RN Notified Ty, RN  Assess: SIRS CRITERIA  SIRS Temperature  0  SIRS Respirations  0  SIRS Pulse 1  SIRS WBC 0  SIRS Score Sum  1

## 2023-09-25 NOTE — Consult Note (Signed)
 Physical Medicine and Rehabilitation Consult Reason for Consult: Impaired functional mobility Referring Physician: Nichole Barker   HPI: Cory [name] is a 78 y.o. male with a history of diabetes, chronic kidney disease, colon cancer, a flutter on Eliquis , prior right BKA who was found down on 09/18/2023 by his caregiver.  He had a hematoma on his left forehead.  GCS was 5 on arrival.  Patient was intubated.  CT of the head chest and pelvis were negative for traumatic injuries.  Glucose was elevated at 437.  CK was 621.  Creatinine was 2.25.  EEG was negative.  Patient was treated empirically for aspiration pneumonia.  He is fluid resuscitated.  Patient has extubated on 09/20/2023.  This felt that the patient was suffering from metabolic encephalopathy related to DKA and acute kidney injury.  UDS was negative.  Patient was last up with therapy on Friday and was max assist for bed mobility, mod assist for sit to stand transfers, still somewhat lethargic.  Gait was not yet attempted.  Prior to admission patient was independent with a wheelchair sometimes using a prosthesis.  Home is wheelchair accessible.  He was modified independent at the wheelchair level.  He lives in a 1 level home alone.  He has a PCA who is available intermittently.    Home: Home Living Family/patient expects to be discharged to:: Private residence Living Arrangements: Alone Available Help at Discharge: Personal care attendant, Family, Available PRN/intermittently Type of Home: Apartment Home Access: Level entry Home Layout: One level Bathroom Shower/Tub: Engineer, manufacturing systems: Standard Bathroom Accessibility: Yes Home Equipment: Rollator (4 wheels), Wheelchair - manual, Tub bench, BSC/3in1 Additional Comments: pt does not drive. Aid comes 2x week for 4 hours to assist with cleaning etc. Neice takes pt to get food.  Functional History: Prior Function Prior Level of Function : Independent/Modified  Independent Mobility Comments: uses prosthetic most of time but occasionally mobilizes with w/c. Apartment is accessible. ADLs Comments: mod I PTA other than driving. Functional Status:  Mobility: Bed Mobility Overal bed mobility: Needs Assistance Bed Mobility: Sit to Supine Supine to sit: Mod assist Sit to supine: Max assist, +2 for physical assistance General bed mobility comments: Needed assist for LEs and for lowering trunk Transfers Overall transfer level: Needs assistance Equipment used: 2 person hand held assist Transfers: Sit to/from Stand, Bed to chair/wheelchair/BSC Sit to Stand: Mod assist, +2 physical assistance Bed to/from chair/wheelchair/BSC transfer type:: Squat pivot Squat pivot transfers: Mod assist, +2 physical assistance General transfer comment: +2 assist required due to pt still lethargic. Pt also with fatigue after sitting up large part of day. Pt is accustomed to getting up with prosthetic which was not in room.      ADL: ADL Overall ADL's : Needs assistance/impaired Eating/Feeding: Moderate assistance, Sitting Eating/Feeding Details (indicate cue type and reason): Pt on modified diet needing cues to slow down, take small bites and sips and min assist to keep food on the fork. Grooming: Oral care, Moderate assistance, Sitting Upper Body Bathing: Minimal assistance, Sitting Lower Body Bathing: Maximal assistance, Sitting/lateral leans, Cueing for compensatory techniques Upper Body Dressing : Minimal assistance, Sitting Lower Body Dressing: Maximal assistance, Sitting/lateral leans, Cueing for compensatory techniques Toilet Transfer: Moderate assistance, Squat-pivot, BSC/3in1 Toileting- Clothing Manipulation and Hygiene: Maximal assistance, Sitting/lateral lean, Cueing for compensatory techniques Functional mobility during ADLs: Moderate assistance General ADL Comments: Pt appears much improved from yesteday per chart but continues to need assist for all adls.  Pt's LE prosthetic not here  but should be here tomorrow.  Cognition: Cognition Orientation Level: Oriented X4 Cognition Arousal: Lethargic Behavior During Therapy: Flat affect   Review of Systems  Unable to perform ROS: Mental acuity   Past Medical History:  Diagnosis Date   Atrial flutter (HCC)    BPH (benign prostatic hyperplasia)    CKD (chronic kidney disease), stage III (HCC)    Colon cancer (HCC)    DM (diabetes mellitus), type 2 (HCC)    HLD (hyperlipidemia)    HTN (hypertension)    Hx of right BKA (HCC)    Prostate CA (HCC)    Prostate CA (HCC)    History reviewed. No pertinent surgical history. History reviewed. No pertinent family history. Social History:  reports that he has quit smoking. His smoking use included cigarettes. He has never used smokeless tobacco. He reports that he does not use drugs. No history on file for alcohol use. Allergies: No Known Allergies Medications Prior to Admission  Medication Sig Dispense Refill   acetaminophen  (TYLENOL ) 325 MG tablet Take 650 mg by mouth every 6 (six) hours as needed for mild pain (pain score 1-3) or moderate pain (pain score 4-6).     apixaban  (ELIQUIS ) 5 MG TABS tablet Take 5 mg by mouth 2 (two) times daily.     atorvastatin  (LIPITOR) 20 MG tablet Take 20 mg by mouth at bedtime.     carvedilol  (COREG ) 25 MG tablet Take 25 mg by mouth 2 (two) times daily.     insulin  lispro (HUMALOG) 100 UNIT/ML injection Inject 0-9 Units into the skin with breakfast, with lunch, and with evening meal. Inject 0-9 Units into the skin 3 (three) times daily with meals.  CBG < 70: Implement Hypoglycemia Standing Orders and refer to Hypoglycemia Standing Orders sidebar report  CBG 70 - 120: 0 units  CBG 121 - 150: 1 unit  CBG 151 - 200: 2 units  CBG 201 - 250: 3 units  CBG 251 - 300: 5 units  CBG 301 - 350: 7 units  CBG 351 - 400: 9 units  CBG > 400: call MD and obtain STAT lab verification     LANTUS  SOLOSTAR 100 UNIT/ML Solostar  Pen Inject 30 Units into the skin at bedtime.     pantoprazole  (PROTONIX ) 40 MG tablet Take 40 mg by mouth 2 (two) times daily.     potassium chloride  (KLOR-CON ) 10 MEQ tablet Take 20 mEq by mouth daily.     sertraline (ZOLOFT) 50 MG tablet Take 50 mg by mouth daily.     tamsulosin  (FLOMAX ) 0.4 MG CAPS capsule Take 0.4 mg by mouth daily after supper.     telmisartan  (MICARDIS ) 40 MG tablet Take 40 mg by mouth daily.     torsemide (DEMADEX) 20 MG tablet Take 20 mg by mouth daily.     Vitamin D, Ergocalciferol, (DRISDOL) 1.25 MG (50000 UNIT) CAPS capsule Take 50,000 Units by mouth once a week.     Continuous Glucose Sensor (DEXCOM G6 SENSOR) MISC Inject 1 Device into the skin See admin instructions. Change device every 10 days.     INVOKANA 300 MG TABS tablet Take 300 mg by mouth daily. (Patient not taking: Reported on 09/19/2023)     KERENDIA 10 MG TABS Take 1 tablet by mouth daily. (Patient not taking: Reported on 09/19/2023)     vitamin B-12 (CYANOCOBALAMIN ) 500 MCG tablet Take 500 mcg by mouth daily. (Patient not taking: Reported on 09/19/2023)       Blood pressure Aaron Aas)  149/99, pulse 98, temperature 98.2 F (36.8 C), temperature source Oral, resp. rate 16, height 5\' 11"  (1.803 m), weight 80.5 kg, SpO2 97%. Physical Exam Constitutional:      General: He is not in acute distress. HENT:     Head: Normocephalic.     Right Ear: External ear normal.     Left Ear: External ear normal.     Nose: Nose normal.     Mouth/Throat:     Mouth: Mucous membranes are moist.  Eyes:     Extraocular Movements: Extraocular movements intact.     Pupils: Pupils are equal, round, and reactive to light.  Cardiovascular:     Rate and Rhythm: Normal rate.  Pulmonary:     Effort: Pulmonary effort is normal.  Abdominal:     Palpations: Abdomen is soft.  Genitourinary:    Comments: Ext urinary cath Musculoskeletal:        General: No swelling or tenderness.     Cervical back: Normal range of motion.  Skin:     General: Skin is warm and dry.  Neurological:     Mental Status: He is alert.     Comments: Pt is alert. Very delayed. Oriented to person, hospital, month/day/year. Slow to process (may be cultural component). Follows basic commands. CN exam non-focal. MMT: BUE 4/5 prox to distal. RLE: 3/5 HF, KE, LLE: 3/5 HF, KE and 4/5 ADF/PF. Stocking glove sensory loss LLE to ankle. DTRs 1+, no abnl resting tone.   Psychiatric:     Comments: Pt flat but cooperative     Results for orders placed or performed during the hospital encounter of 09/18/23 (from the past 24 hours)  Glucose, capillary     Status: Abnormal   Collection Time: 09/24/23  4:23 PM  Result Value Ref Range   Glucose-Capillary 124 (H) 70 - 99 mg/dL   Comment 1 Notify RN   CBC with Differential/Platelet     Status: Abnormal   Collection Time: 09/25/23  6:55 AM  Result Value Ref Range   WBC 6.1 4.0 - 10.5 K/uL   RBC 4.13 (L) 4.22 - 5.81 MIL/uL   Hemoglobin 10.6 (L) 13.0 - 17.0 g/dL   HCT 95.6 (L) 21.3 - 08.6 %   MCV 77.5 (L) 80.0 - 100.0 fL   MCH 25.7 (L) 26.0 - 34.0 pg   MCHC 33.1 30.0 - 36.0 g/dL   RDW 57.8 46.9 - 62.9 %   Platelets 239 150 - 400 K/uL   nRBC 0.0 0.0 - 0.2 %   Neutrophils Relative % 58 %   Neutro Abs 3.6 1.7 - 7.7 K/uL   Lymphocytes Relative 21 %   Lymphs Abs 1.3 0.7 - 4.0 K/uL   Monocytes Relative 13 %   Monocytes Absolute 0.8 0.1 - 1.0 K/uL   Eosinophils Relative 6 %   Eosinophils Absolute 0.4 0.0 - 0.5 K/uL   Basophils Relative 1 %   Basophils Absolute 0.0 0.0 - 0.1 K/uL   Immature Granulocytes 1 %   Abs Immature Granulocytes 0.06 0.00 - 0.07 K/uL  Basic metabolic panel     Status: Abnormal   Collection Time: 09/25/23  6:55 AM  Result Value Ref Range   Sodium 141 135 - 145 mmol/L   Potassium 3.9 3.5 - 5.1 mmol/L   Chloride 112 (H) 98 - 111 mmol/L   CO2 23 22 - 32 mmol/L   Glucose, Bld 128 (H) 70 - 99 mg/dL   BUN 21 8 - 23 mg/dL  Creatinine, Ser 1.97 (H) 0.61 - 1.24 mg/dL   Calcium  8.0 (L) 8.9  - 10.3 mg/dL   GFR, Estimated 35 (L) >60 mL/min   Anion gap 6 5 - 15   No results found.  Assessment/Plan: Diagnosis: 78 year old male with metabolic encephalopathy in the setting of acute respiratory failure and a prior CVA and right BKA. Does the need for close, 24 hr/day medical supervision in concert with the patient's rehab needs make it unreasonable for this patient to be served in a less intensive setting? Yes Co-Morbidities requiring supervision/potential complications:  - Acute on chronic kidney disease -Acute systolic congestive heart failure with ejection fraction of 25 to 30% -Atrial flutter -Poorly controlled diabetes -Chronic anemia Due to bladder management, bowel management, safety, skin/wound care, disease management, medication administration, pain management, and patient education, does the patient require 24 hr/day rehab nursing? Yes Does the patient require coordinated care of a physician, rehab nurse, therapy disciplines of PT, OT, SLP to address physical and functional deficits in the context of the above medical diagnosis(es)? Yes Addressing deficits in the following areas: balance, endurance, locomotion, strength, transferring, bowel/bladder control, bathing, dressing, feeding, grooming, toileting, cognition, and psychosocial support Can the patient actively participate in an intensive therapy program of at least 3 hrs of therapy per day at least 5 days per week? Yes The potential for patient to make measurable gains while on inpatient rehab is excellent Anticipated functional outcomes upon discharge from inpatient rehab are supervision and min assist  with PT, supervision and min assist with OT, modified independent and supervision with SLP. Estimated rehab length of stay to reach the above functional goals is: 11-15 days Anticipated discharge destination: Home Overall Rehab/Functional Prognosis: excellent  POST ACUTE RECOMMENDATIONS: This patient's condition is  appropriate for continued rehabilitative care in the following setting: see below, inpatient rehab Patient has agreed to participate in recommended program. Yes Note that insurance prior authorization may be required for reimbursement for recommended care.  Comment: Pt hasn't seen therapy since 5/26 but appears to have excellent potential to make gains on inpatient rehab. He will not go home mod I initially, however. He has a PCA apparently. Is there anyone else who can provide assistance when he gets home?Rehab Admissions Coordinator to follow up.      I have personally performed a face to face diagnostic evaluation of this patient. Additionally, I have examined the patient's medical record including any pertinent labs and radiographic images.    Thanks,  Rawland Caddy, MD 09/25/2023

## 2023-09-25 NOTE — Progress Notes (Signed)
 Physical Therapy Treatment Patient Details Name: Cory [name] MRN: [id number] DOB: 1945-10-13 Today's Date: 09/25/2023   History of Present Illness Pt is a 78 yo male found down by his aid and found to have severe encephalopathy, DKA, CRF. CT with no large findings. PMH: sz d/o, RBKA 2022, aflutter, CKD, colon CA, prostate, CA, DM, HTN, CVA, R pupil fixed adn dialated chronically.    PT Comments  Pt tolerated treatment well today. Pt today was able to transfer to [facility] with +2 Min A using RW. Further mobility and ambulation deferred due to bowel incontinence. Pt cognition appears to have improved compared to previous sessions however is still noted with slow processing and some safety awareness deficits. No change in DC/DME recs at this time. PT will continue to follow.     If plan is discharge home, recommend the following: A little help with bathing/dressing/bathroom;Assistance with cooking/housework;Assist for transportation;Help with stairs or ramp for entrance   Can travel by private vehicle        Equipment Recommendations  None recommended by PT    Recommendations for Other Services       Precautions / Restrictions Precautions Precautions: Fall;Other (comment) Recall of Precautions/Restrictions: Impaired Required Braces or Orthoses: Other Brace Other Brace: RLE prosthetic Restrictions Weight Bearing Restrictions Per Provider Order: No     Mobility  Bed Mobility Overal bed mobility: Needs Assistance Bed Mobility: Supine to Sit     Supine to sit: Supervision     General bed mobility comments: Increased time however no physical assistance needed.    Transfers Overall transfer level: Needs assistance Equipment used: Rolling walker (2 wheels) Transfers: Sit to/from Stand, Bed to chair/wheelchair/BSC Sit to Stand: +2 physical assistance, Min assist, Mod assist   Step pivot transfers: +2 safety/equipment, Min assist       General transfer comment: Pt able to  transfer to [facility] with +2 Min A for safety.    Ambulation/Gait               General Gait Details: Ambulation deferred due to bowel incontinence   Stairs             Wheelchair Mobility     Tilt Bed    Modified Rankin (Stroke Patients Only)       Balance Overall balance assessment: Needs assistance Sitting-balance support: Feet supported Sitting balance-Leahy Scale: Fair     Standing balance support: During functional activity, Reliant on assistive device for balance, Bilateral upper extremity supported Standing balance-Leahy Scale: Poor Standing balance comment: reliant on RW                            Communication Communication Communication: Impaired Factors Affecting Communication: Hearing impaired  Cognition Arousal: Alert Behavior During Therapy: Flat affect   PT - Cognitive impairments: Problem solving, Safety/Judgement, Sequencing                       PT - Cognition Comments: Pt more alert today however noted with slow processing Following commands: Intact      Cueing Cueing Techniques: Verbal cues  Exercises      General Comments General comments (skin integrity, edema, etc.): VSS      Pertinent Vitals/Pain Pain Assessment Pain Assessment: No/denies pain    Home Living                          Prior Function  PT Goals (current goals can now be found in the care plan section) Progress towards PT goals: Progressing toward goals    Frequency    Min 2X/week      PT Plan      Co-evaluation              AM-PAC PT "6 Clicks" Mobility   Outcome Measure  Help needed turning from your back to your side while in a flat bed without using bedrails?: A Little Help needed moving from lying on your back to sitting on the side of a flat bed without using bedrails?: A Little Help needed moving to and from a bed to a chair (including a wheelchair)?: A Little Help needed standing up  from a chair using your arms (e.g., wheelchair or bedside chair)?: A Little Help needed to walk in hospital room?: A Lot Help needed climbing 3-5 steps with a railing? : Total 6 Click Score: 15    End of Session Equipment Utilized During Treatment: Gait belt Activity Tolerance: Patient tolerated treatment well Patient left: in bed;with call bell/phone within reach;with bed alarm set Nurse Communication: Mobility status PT Visit Diagnosis: Muscle weakness (generalized) (M62.81)     Time: 4696-2952 PT Time Calculation (min) (ACUTE ONLY): 38 min  Charges:    $Therapeutic Activity: 38-52 mins PT General Charges $$ ACUTE PT VISIT: 1 Visit                     Rodgers Clack, PT, DPT Acute Rehab Services [phone]    Thaddius Manes 09/25/2023, 4:35 PM

## 2023-09-25 NOTE — Progress Notes (Addendum)
 Inpatient Rehab Admissions Coordinator:  Saw pt at bedside. Appeared less confused than previous meeting. Discussed with pt that would be following therapy progress to help determine if he would be able to tolerate the intensity of CIR and also the likelihood that pt could reach Mod I goals as needed given his dispo.  Will continue to follow.   1605: Spoke with pt's niece [name] regarding the recommendations from the physiatrist consult. She again confirmed that family will not be able to provide 24/7 support for pt after discharge. She is agreeable to discuss SNF with TOC. TOC made aware. AC will sign off.    Artemus Larsen, MS, CCC-SLP Admissions Coordinator [phone]

## 2023-09-25 NOTE — NC FL2 (Signed)
 [place]  MEDICAID FL2 LEVEL OF CARE FORM     IDENTIFICATION  Patient Name: Cory [name] Birthdate: 1945-11-07 Sex: male Admission Date (Current Location): 09/18/2023  [facility] and [place] Number:  Producer, television/film/video and Address:  [facility], [address], [place], [place] [address]      Provider Number: [id number]  Attending Physician Name and Address:  Feliciana Horn, MD  Relative Name and Phone Number:       Current Level of Care: Hospital Recommended Level of Care: Skilled Nursing Facility Prior Approval Number:    Date Approved/Denied:   PASRR Number: [id number]  Discharge Plan: SNF    Current Diagnoses: Patient Active Problem List   Diagnosis Date Noted   HFrEF (heart failure with reduced ejection fraction) (HCC) 09/25/2023   Iron  deficiency anemia 09/21/2023   Left heart failure (HCC) 09/21/2023   Left ventricular failure (HCC) 09/21/2023   Pressure injury of skin 09/21/2023   Acute encephalopathy 09/18/2023    Orientation RESPIRATION BLADDER Height & Weight     Self, Time, Situation, Place  Normal Continent Weight: 177 lb 7.5 oz (80.5 kg) Height:  5\' 11"  (180.3 cm)  BEHAVIORAL SYMPTOMS/MOOD NEUROLOGICAL BOWEL NUTRITION STATUS      Incontinent Diet (heart healthy/carb modified)  AMBULATORY STATUS COMMUNICATION OF NEEDS Skin   Extensive Assist Verbally PU Stage and Appropriate Care     PU Stage 3 Dressing: BID (sacrum, foam dressing)                 Personal Care Assistance Level of Assistance  Bathing, Feeding, Dressing Bathing Assistance: Maximum assistance Feeding assistance: Limited assistance Dressing Assistance: Maximum assistance     Functional Limitations Info  Speech     Speech Info: Impaired (delayed responses)    SPECIAL CARE FACTORS FREQUENCY  PT (By licensed PT), OT (By licensed OT)     PT Frequency: 5x/wk OT Frequency: 5x/wk            Contractures Contractures Info: Not present     Additional Factors Info  Code Status, Allergies, Insulin  Sliding Scale, Isolation Precautions Code Status Info: Full Allergies Info: NKA   Insulin  Sliding Scale Info: see DC summary Isolation Precautions Info: Contact precautions: ESBL     Current Medications (09/25/2023):  This is the current hospital active medication list Current Facility-Administered Medications  Medication Dose Route Frequency Provider Last Rate Last Admin   acetaminophen  (TYLENOL ) 160 MG/5ML solution 650 mg  650 mg Oral Q4H PRN Utomwen, Adesuwa, [name]   650 mg at 09/24/23 1058   apixaban  (ELIQUIS ) tablet 5 mg  5 mg Oral BID Utomwen, Adesuwa, [name]   5 mg at 09/25/23 0946   carvedilol  (COREG ) tablet 25 mg  25 mg Oral BID WC Johnie Nailer B, NP       Chlorhexidine  Gluconate Cloth 2 % PADS 6 each  6 each Topical Daily Olalere, Adewale A, MD   6 each at 09/25/23 0949   feeding supplement (ENSURE ENLIVE / ENSURE PLUS) liquid 237 mL  237 mL Oral BID BM Albustami, Omar M, MD   237 mL at 09/25/23 1402   ferrous sulfate  tablet 325 mg  325 mg Oral BID WC Utomwen, Adesuwa, [name]   325 mg at 09/25/23 0946   guaiFENesin  tablet 600 mg  600 mg Oral Q12H Utomwen, Adesuwa, [name]   600 mg at 09/25/23 0947   insulin  aspart (novoLOG ) injection 0-6 Units  0-6 Units Subcutaneous TID WC Ree Candy, MD  5 Units at 09/25/23 1258   insulin  glargine-yfgn (SEMGLEE ) injection 20 Units  20 Units Subcutaneous Daily Ree Candy, MD   20 Units at 09/25/23 0947   ipratropium (ATROVENT ) nebulizer solution 0.5 mg  0.5 mg Nebulization BID Anderson, Chelsey L, MD   0.5 mg at 09/25/23 0830   isosorbide -hydrALAZINE  (BIDIL ) 20-37.5 MG per tablet 2 tablet  2 tablet Oral TID Mallipeddi, Vishnu P, MD   2 tablet at 09/25/23 0946   leptospermum manuka honey (MEDIHONEY) paste 1 Application  1 Application Topical Daily Olalere, Adewale A, MD   1 Application at 09/25/23 0947   meropenem  (MERREM ) 1 g in sodium chloride  0.9 % 100 mL IVPB  1 g  Intravenous Q12H Millen, Jessica B, [name] 200 mL/hr at 09/25/23 0949 1 g at 09/25/23 1610   metoprolol  tartrate (LOPRESSOR ) injection 2.5 mg  2.5 mg Intravenous Q4H PRN Ogan, Okoronkwo U, MD       Oral care mouth rinse  15 mL Mouth Rinse PRN Albustami, Linna Richard, MD       Oral care mouth rinse  15 mL Mouth Rinse PRN Feliciana Horn, MD       Oral care mouth rinse  15 mL Mouth Rinse PRN Akula, Vijaya, MD       pantoprazole  (PROTONIX ) EC tablet 40 mg  40 mg Oral Daily Utomwen, Adesuwa, [name]   40 mg at 09/25/23 0946   polyethylene glycol (MIRALAX  / GLYCOLAX ) packet 17 g  17 g Oral Daily PRN Utomwen, Adesuwa, [name]         Discharge Medications: Please see discharge summary for a list of discharge medications.  Relevant Imaging Results:  Relevant Lab Results:   Additional Information SS#: [id number]  [name], [place]

## 2023-09-25 NOTE — Progress Notes (Addendum)
 Patient Name: Elise Gladden Date of Encounter: 09/25/2023 [facility] Cardiologist: Arun K Thukkani, MD   Interval Summary  .    Sitting up in bed, no complaints this morning. Alert and oriented this morning.  Vital Signs .    Vitals:   09/24/23 2355 09/25/23 0346 09/25/23 0751 09/25/23 0830  BP: 123/87 130/82 (!) 149/99   Pulse: 90 (!) 130 98   Resp:  18 16   Temp: 97.9 F (36.6 C) 97.9 F (36.6 C) 98.2 F (36.8 C)   TempSrc: Oral Oral Oral   SpO2: 100% 100% 99% 97%  Weight:      Height:        Intake/Output Summary (Last 24 hours) at 09/25/2023 1047 Last data filed at 09/25/2023 0751 Gross per 24 hour  Intake 320 ml  Output 800 ml  Net -480 ml      09/24/2023    7:52 AM 09/23/2023    5:00 AM 09/22/2023    5:00 AM  Last 3 Weights  Weight (lbs) 177 lb 7.5 oz 180 lb 12.4 oz 192 lb 14.4 oz  Weight (kg) 80.5 kg 82 kg 87.5 kg      Telemetry/ECG    Atrial flutter RVR 130-140s - Personally Reviewed  Physical Exam .    GEN: No acute distress.   Neck: No JVD Cardiac: Tachycardic, no murmurs, rubs, or gallops.  Respiratory: Clear to auscultation bilaterally. GI: Soft, nontender, non-distended  MS: R BKA  Assessment & Plan .     78 y.o. 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 Gaynell Keeler.   Acute HFrEF -- Echo 5/13 with LVEF of 25 to 30%, global hypokinesis, mildly reduced RV, no significant valvular disease.  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 -- GDMT: Limited in the setting of CKD, currently on carvedilol  12.5 mg twice daily, BiDil  3 times daily.  Would avoid SGLT2, ACE/ARB in the setting of CKD stage III  Persistent atrial flutter -- Continues to be in atrial flutter, rates elevated today in the  130-140 range. -- Further increase carvedilol  to 25 mg twice daily -- Continue Eliquis  5 mg twice daily  Sepsis Metabolic encephalopathy DKA --Improving, management per primary  Hyperlipidemia -- Statin remains on hold secondary to rhabdomyolysis  Per Primary Diabetes  Anemia CKD IIIb Hx of prostate/colon Ca  For questions or updates, please contact [facility] Please consult [email or web address] for contact info under        Signed, Johnie Nailer, NP   Patient seen, examined. Available data reviewed. Agree with findings, assessment, and plan as outlined by Johnie Nailer, NP. On my exam: Vitals:   09/25/23 0830 09/25/23 1251  BP:  (!) 121/57  Pulse:  (!) 140  Resp:  19  Temp:  99.6 F (37.6 C)  SpO2: 97% 99%  Pt is alert and oriented, NAD HEENT: normal Neck: JVP - normal, carotids 2+= Lungs: CTA bilaterally CV: tachycardic and regular without murmur or gallop Abd: soft, NT, Positive BS, no hepatomegaly Ext: right BKA, left leg without edema Skin: warm/dry no rash  Tele: atrial flutter with 2:1 conduction, HR 140 bpm  LVEF 25-30% by echo  Available data reviewed. Suspect reduced LVEF secondary to combination of sepsis and tachycardia-mediated. Agree with increased beta-blockade (carvedilol  25 mg BID).  May need to add oral amiodarone  for additional rate control if he remains persistently tachycardic. Agree with recommendations above for heart failure GDMT.    Arnoldo Lapping, M.D. 09/25/2023 1:28 PM

## 2023-09-25 NOTE — TOC Progression Note (Signed)
 Transition of Care [facility]) - Progression Note    Patient Details  Name: Cory [name] MRN: [id number] Date of Birth: 1945-09-20  Transition of Care [facility]) CM/SW Contact  [name], [place] Phone Number: 09/25/2023, 4:26 PM  Clinical Narrative:   CSW updated by Rehab Admissions that patient's family unable to provide 24/7 assistance, agreeable to SNF placement. CSW completed referral and faxed out, will follow with bed offers.    Expected Discharge Plan: Skilled Nursing Facility Barriers to Discharge: English as a second language teacher, Continued Medical Work up  Expected Discharge Plan and Services                                               Social Determinants of Health (SDOH) Interventions SDOH Screenings   Food Insecurity: No Food Insecurity (09/18/2023)  Housing: Low Risk  (09/18/2023)  Transportation Needs: No Transportation Needs (09/18/2023)  Utilities: Not At Risk (09/18/2023)  Social Connections: Patient Unable To Answer (09/18/2023)  Tobacco Use: Medium Risk (09/18/2023)    Readmission Risk Interventions     No data to display

## 2023-09-25 NOTE — Progress Notes (Signed)
 [facility]                                                                               [name], is a 78 y.o. male, DOB - 12/18/1945, ZOX:[id number] Admit date - 09/18/2023    Outpatient Primary MD for the patient is Cory [name], Cory [name]., MD  LOS - 7  days    Brief summary   Cory [name] is a 78 y.o. male with a PMH significant for DM, CKD, colon cancer in splenic flexure, a flutter on Eliquis , HTN, previous seizures secondary to hyperglycemia, previous encephalopathy, BKA in 2022, previous CVA who lives at home by himself and was found down by caregiver after unknown time.  He was found to have hematoma to left forehead therefore level 1 trauma activated. GCS 5 on arrival with pill rolling motions noted in BUEs. Intubated for airway protection. Workup with CT head, chest, abd/pelvis negative for traumatic injuries. Labs noted for glucose 437, BUN/ sCr 26/ 2.25, t. Bili 1.3, albumin 3.4, CK 621, lactic 2.2, INR 1.3, H/H 13.9/ 42.  5/12 Admitted, intubated and sedated 5/13 EEG neg 5/14 extubated. No sz on eeg . He was transferred to [facility] on [facility]16.      Assessment & Plan    Assessment and Plan:  Acute metabolic encephalopathy  in the setting of prior CVA  From DKA , AKI and lactic acidosis.  UDS is negative. CT head negative. Ammonia level negative.  spot EEG 5/12> severe diffuse encephalopathy, no seizures/ epileptiform discharges. No sz captured on cEEG through 5/14  Mental status slowly improving. He is more alert today and interacting.    Unwitnessed fall with left scalp hematoma.  Pain control.    Acute respiratory failure with hypoxia/ sepsis ? From aspiration.  Currently on Meropenam to complete the course.  Blood cultures pending and negative so far.    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 Creatinine at 1.8 today.   Hypokalemia Replaced. Repeat levels wnl.   Acute systolic CHF Echo showed LVEF of 25% to 30 % .  ? Stress  cardiomyopathy.  On coreg  12.5 mg BID and Bidil  20/37.5 TID.   Holding the ARB/Entresto and spironolactone given AKI.  Cardiology on board.  Would avoid SGLT2, ACE/ARB in the setting of CKD stage III    Atrial flutter On eliquis  5 mg BID.  Increase coreg  to 25 mg BID.    Hyperlipidemia Holding statin for rhabdomyolysis.     Anemia of acute illness. / iron  deficiency anemia.  Microcytic , hemoglobin around 9.8  Iron  supplementation on board.   Type 2 DM with hyperglycemia CBG (last 3)  Recent Labs    09/24/23 1104 09/24/23 1623 09/25/23 1242  GLUCAP 133* 124* 351*   Poorly controlled. Patient insulin  increased to 20 units.  Currently on SSI.  A1c is 11.9  RN Pressure Injury Documentation: Pressure Injury 09/18/23 Sacrum Medial Stage 3 -  Full thickness tissue loss. Subcutaneous fat may be visible but bone, tendon or muscle are NOT exposed. Stg 3 (Active)  09/18/23 1439  Location: Sacrum  Location Orientation: Medial  Staging: Stage 3 -  Full thickness tissue loss. Subcutaneous  fat may be visible but bone, tendon or muscle are NOT exposed.  Wound Description (Comments): Stg 3  Present on Admission: Yes  Dressing Type Foam - Lift dressing to assess site every shift 09/25/23 0751  Foam dressing in place. Local wound care.   Malnutrition Type:  Nutrition Problem: Inadequate oral intake Etiology: acute illness   Malnutrition Characteristics:  Signs/Symptoms: NPO status   Nutrition Interventions:  Interventions: Ensure Enlive (each supplement provides 350kcal and 20 grams of protein), Other (Comment) (feeding assistance)  Estimated body mass index is 24.75 kg/m as calculated from the following:   Height as of this encounter: 5\' 11"  (1.803 m).   Weight as of this encounter: 80.5 kg.  Code Status: full code.  DVT Prophylaxis:  Place and maintain sequential compression device Start: 09/18/23 1549 apixaban  (ELIQUIS ) tablet 5 mg   Level of Care: Level of care:  Progressive Family Communication: none at bedside.   Disposition Plan:     Remains inpatient appropriate:  pending CIR.   Procedures:  EEG Extubation.   Consultants:   Cardiology PCCM.   Antimicrobials:   Anti-infectives (From admission, onward)    Start     Dose/Rate Route Frequency Ordered Stop   09/23/23 2200  meropenem  (MERREM ) 1 g in sodium chloride  0.9 % 100 mL IVPB        1 g 200 mL/hr over 30 Minutes Intravenous Every 12 hours 09/23/23 1436     09/22/23 1800  meropenem  (MERREM ) 500 mg in sodium chloride  0.9 % 100 mL IVPB  Status:  Discontinued        500 mg 200 mL/hr over 30 Minutes Intravenous Every 12 hours 09/22/23 1647 09/23/23 1436   09/22/23 0930  ceFEPIme  (MAXIPIME ) 2 g in sodium chloride  0.9 % 100 mL IVPB  Status:  Discontinued        2 g 200 mL/hr over 30 Minutes Intravenous Every 24 hours 09/21/23 1048 09/22/23 1647   09/21/23 0930  ceFEPIme  (MAXIPIME ) 2 g in sodium chloride  0.9 % 100 mL IVPB  Status:  Discontinued        2 g 200 mL/hr over 30 Minutes Intravenous Every 24 hours 09/21/23 0834 09/21/23 1048   09/19/23 1015  cefTRIAXone  (ROCEPHIN ) 2 g in sodium chloride  0.9 % 100 mL IVPB  Status:  Discontinued        2 g 200 mL/hr over 30 Minutes Intravenous Every 24 hours 09/19/23 0923 09/21/23 0834        Medications  Scheduled Meds:  apixaban   5 mg Oral BID   carvedilol   25 mg Oral BID WC   Chlorhexidine  Gluconate Cloth  6 each Topical Daily   feeding supplement  237 mL Oral BID BM   ferrous sulfate   325 mg Oral BID WC   guaiFENesin   600 mg Oral Q12H   insulin  aspart  0-6 Units Subcutaneous TID WC   insulin  glargine-yfgn  20 Units Subcutaneous Daily   ipratropium  0.5 mg Nebulization BID   isosorbide -hydrALAZINE   2 tablet Oral TID   leptospermum manuka honey  1 Application Topical Daily   pantoprazole   40 mg Oral Daily   Continuous Infusions:  meropenem  (MERREM ) IV 1 g (09/25/23 0949)   PRN Meds:.acetaminophen  (TYLENOL ) oral liquid 160 mg/5  mL, metoprolol  tartrate, mouth rinse, mouth rinse, mouth rinse, polyethylene glycol    Subjective:   Cory [name] was seen and examined today. No  new complaints.  Objective:   Vitals:   09/25/23 0346 09/25/23 0751 09/25/23 0830 09/25/23  1251  BP: 130/82 (!) 149/99  (!) 121/57  Pulse: (!) 130 98  (!) 140  Resp: 18 16  19   Temp: 97.9 F (36.6 C) 98.2 F (36.8 C)  99.6 F (37.6 C)  TempSrc: Oral Oral  Oral  SpO2: 100% 99% 97% 99%  Weight:      Height:        Intake/Output Summary (Last 24 hours) at 09/25/2023 1511 Last data filed at 09/25/2023 0751 Gross per 24 hour  Intake 220 ml  Output 800 ml  Net -580 ml   Filed Weights   09/22/23 0500 09/23/23 0500 09/24/23 0752  Weight: 87.5 kg 82 kg 80.5 kg     Exam General exam: Appears calm and comfortable  Respiratory system: Clear to auscultation. Respiratory effort normal. Cardiovascular system: S1 & S2 heard, RRR. No JVD, Gastrointestinal system: Abdomen is nondistended, soft and nontender. Central nervous system: Alert and oriented.  Extremities: right BKA.  Skin: No rashes, Psychiatry:  Mood & affect appropriate.    Data Reviewed:  I have personally reviewed following labs and imaging studies   CBC Lab Results  Component Value Date   WBC 6.1 09/25/2023   RBC 4.13 (L) 09/25/2023   HGB 10.6 (L) 09/25/2023   HCT 32.0 (L) 09/25/2023   MCV 77.5 (L) 09/25/2023   MCH 25.7 (L) 09/25/2023   PLT 239 09/25/2023   MCHC 33.1 09/25/2023   RDW 14.6 09/25/2023   LYMPHSABS 1.3 09/25/2023   MONOABS 0.8 09/25/2023   EOSABS 0.4 09/25/2023   BASOSABS 0.0 09/25/2023     Last metabolic panel Lab Results  Component Value Date   NA 141 09/25/2023   K 3.9 09/25/2023   CL 112 (H) 09/25/2023   CO2 23 09/25/2023   BUN 21 09/25/2023   CREATININE 1.97 (H) 09/25/2023   GLUCOSE 128 (H) 09/25/2023   GFRNONAA 35 (L) 09/25/2023   CALCIUM  8.0 (L) 09/25/2023   PHOS 3.7 09/20/2023   PROT 5.8 (L) 09/19/2023   ALBUMIN 2.4 (L)  09/19/2023   BILITOT 0.7 09/19/2023   ALKPHOS 56 09/19/2023   AST 38 09/19/2023   ALT 21 09/19/2023   ANIONGAP 6 09/25/2023    CBG (last 3)  Recent Labs    09/24/23 1104 09/24/23 1623 09/25/23 1242  GLUCAP 133* 124* 351*      Coagulation Profile: No results for input(s): "INR", "PROTIME" in the last 168 hours.    Radiology Studies: No results found.     Feliciana Horn M.D. [facility] 09/25/2023, 3:11 PM  Available via Epic secure chat 7am-7pm After 7 pm, please refer to night coverage provider listed on amion.

## 2023-09-26 DIAGNOSIS — I4892 Unspecified atrial flutter: Secondary | ICD-10-CM | POA: Diagnosis not present

## 2023-09-26 DIAGNOSIS — I5020 Unspecified systolic (congestive) heart failure: Secondary | ICD-10-CM | POA: Diagnosis not present

## 2023-09-26 DIAGNOSIS — G9340 Encephalopathy, unspecified: Secondary | ICD-10-CM | POA: Diagnosis not present

## 2023-09-26 DIAGNOSIS — I5181 Takotsubo syndrome: Secondary | ICD-10-CM | POA: Diagnosis not present

## 2023-09-26 DIAGNOSIS — I5021 Acute systolic (congestive) heart failure: Secondary | ICD-10-CM | POA: Diagnosis not present

## 2023-09-26 LAB — GLUCOSE, CAPILLARY
Glucose-Capillary: 170 mg/dL — ABNORMAL HIGH (ref 70–99)
Glucose-Capillary: 168 mg/dL — ABNORMAL HIGH (ref 70–99)
Glucose-Capillary: 148 mg/dL — ABNORMAL HIGH (ref 70–99)
Glucose-Capillary: 280 mg/dL — ABNORMAL HIGH (ref 70–99)
Glucose-Capillary: 357 mg/dL — ABNORMAL HIGH (ref 70–99)

## 2023-09-26 LAB — CK: Total CK: 150 U/L (ref 49–397)

## 2023-09-26 MED ORDER — AMLODIPINE BESYLATE 5 MG PO TABS
5.0000 mg | ORAL_TABLET | Freq: Every day | ORAL | Status: DC
  Administered 2023-09-26 – 2023-09-29 (×4): 5 mg via ORAL
  Filled 2023-09-26 (×4): qty 1

## 2023-09-26 MED ORDER — INSULIN ASPART 100 UNIT/ML IJ SOLN
0.0000 [IU] | Freq: Every day | INTRAMUSCULAR | Status: DC
  Administered 2023-09-26: 5 [IU] via SUBCUTANEOUS
  Administered 2023-09-27: 2 [IU] via SUBCUTANEOUS

## 2023-09-26 NOTE — Progress Notes (Signed)
 Speech Language Pathology Treatment: Dysphagia  Patient Details Name: Cory [name] MRN: [id number] DOB: 17-Aug-1945 Today's Date: 09/26/2023 Time: 2130-8657 SLP Time Calculation (min) (ACUTE ONLY): 14 min  Assessment / Plan / Recommendation Clinical Impression  Pt had been on Dys 3 texture and MD has since upgraded to regular. Pt states sometimes the food is hard to masticate depending on how the meats are cooked. There was no pocketed food from breakfast eaten earlier this morning. He masticated graham cracker timely without residue. One delayed throat clear with sips thin via straw not concerning for decreased airway protection. After session SLP reentered room making sure bed alarm was set and found pt eating graham crackers in reclined position. SLP positioned upright and encouraged position with all po's. Recommend continue regular texture, thin liquids, pills whole in applesauce or with thin if tolerating with nursing. No further ST needed.    HPI HPI: This is a 78 yo black male with a significant PMH including DM, CKD, colon cancer in splenic flexure, a flutter on Eliquis , HTN, previous seizures secondary to hyperglycemia, previous encephalopathy, BKA in 2022, previous CVA who apparently lives at home by himself and was found down from a presumed unwitnessed fall by his aid when she came to the house.  Intubated 5/12-5/14. 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      SLP Plan  All goals met;Discharge SLP treatment due to (comment)      Recommendations for follow up therapy are one component of a multi-disciplinary discharge planning process, led by the attending physician.  Recommendations may be updated based on patient status, additional functional criteria and insurance authorization.    Recommendations  Diet recommendations: Regular;Thin  liquid Liquids provided via: Straw;Cup Medication Administration: Whole meds with puree Supervision: Patient able to self feed Compensations: Slow rate;Small sips/bites Postural Changes and/or Swallow Maneuvers: Seated upright 90 degrees                  Oral care BID   Intermittent Supervision/Assistance Dysphagia, unspecified (R13.10)     All goals met;Discharge SLP treatment due to (comment)     [name]  09/26/2023, 10:58 AM

## 2023-09-26 NOTE — Progress Notes (Signed)
 [facility] night cross cover note:   I was notified by the patient's RN  that at bedtime CBG is 357 .  I subsequently added nightly sliding scale insulin  coverage.   Camelia Cavalier, DO Hospitalist

## 2023-09-26 NOTE — Progress Notes (Signed)
 [facility]                                                                               [place], is a 78 y.o. male, DOB - 1945-06-11, AOZ:[id number] Admit date - 09/18/2023    Outpatient Primary MD for the patient is Yvonnie Heritage, Lavonia Powers., MD  LOS - 8  days    Brief summary   Cory [name] is a 78 y.o. male with a PMH significant for DM, CKD, colon cancer in splenic flexure, a flutter on Eliquis , HTN, previous seizures secondary to hyperglycemia, previous encephalopathy, BKA in 2022, previous CVA who lives at home by himself and was found down by caregiver after unknown time.  He was found to have hematoma to left forehead therefore level 1 trauma activated. GCS 5 on arrival with pill rolling motions noted in BUEs. Intubated for airway protection. Workup with CT head, chest, abd/pelvis negative for traumatic injuries. Labs noted for glucose 437, BUN/ sCr 26/ 2.25, t. Bili 1.3, albumin 3.4, CK 621, lactic 2.2, INR 1.3, H/H 13.9/ 42.  5/12 Admitted, intubated and sedated 5/13 EEG neg 5/14 extubated. No sz on eeg . He was transferred to [facility] on 5/16.   currently waiting for SNF Placement.    Assessment & Plan    Assessment and Plan:  Acute metabolic encephalopathy  in the setting of prior CVA  From DKA , AKI and lactic acidosis.  UDS is negative. CT head negative. Ammonia level negative.  spot EEG 5/12> severe diffuse encephalopathy, no seizures/ epileptiform discharges. No sz captured on cEEG through 5/14  He appears to be back to baseline.     Unwitnessed fall with left scalp hematoma.  Pain control.    Acute respiratory failure with hypoxia/ sepsis ? From aspiration.  Currently on Meropenam to complete the course.  Blood cultures pending and negative so far.    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 Creatinine at baseline.   Hypokalemia Replaced. Repeat levels wnl.   Acute systolic CHF Echo showed LVEF of 25% to 30 % .  ? Stress  cardiomyopathy.  On coreg  12.5 mg BID and Bidil  20/37.5 TID.   Holding the ARB/Entresto and spironolactone given AKI.  Cardiology on board.  Would avoid SGLT2, ACE/ARB in the setting of CKD stage III    Atrial flutter On eliquis  5 mg BID.  Increase coreg  to 25 mg BID.    Hyperlipidemia Holding statin for rhabdomyolysis.     Anemia of acute illness. / iron  deficiency anemia.  Microcytic , hemoglobin around 9.8  Iron  supplementation on board.   Hypertension Suboptimal.  Restarted home meds and added low dose norvasc  5 mg daily.   Type 2 DM with hyperglycemia CBG (last 3)  Recent Labs    09/26/23 0613 09/26/23 1144 09/26/23 1307  GLUCAP 170* 168* 148*   Better controlled CBG'S/  Currently on SSI.  A1c is 11.9  RN Pressure Injury Documentation: Pressure Injury 09/18/23 Sacrum Medial Stage 3 -  Full thickness tissue loss. Subcutaneous fat may be visible but bone, tendon or muscle are NOT exposed. Stg 3 (Active)  09/18/23 1439  Location: Sacrum  Location Orientation:  Medial  Staging: Stage 3 -  Full thickness tissue loss. Subcutaneous fat may be visible but bone, tendon or muscle are NOT exposed.  Wound Description (Comments): Stg 3  Present on Admission: Yes  Dressing Type Foam - Lift dressing to assess site every shift 09/26/23 0000  Foam dressing in place. Local wound care.   Malnutrition Type:  Nutrition Problem: Inadequate oral intake Etiology: acute illness   Malnutrition Characteristics:  Signs/Symptoms: NPO status   Nutrition Interventions:  Interventions: Ensure Enlive (each supplement provides 350kcal and 20 grams of protein), Other (Comment) (feeding assistance)  Estimated body mass index is 24.75 kg/m as calculated from the following:   Height as of this encounter: 5\' 11"  (1.803 m).   Weight as of this encounter: 80.5 kg.  Code Status: full code.  DVT Prophylaxis:  Place and maintain sequential compression device Start: 09/18/23  1549 apixaban  (ELIQUIS ) tablet 5 mg   Level of Care: Level of care: Progressive Family Communication: none at bedside.   Disposition Plan:     Remains inpatient appropriate:  pending CIR/snf   Procedures:  EEG Extubation.   Consultants:   Cardiology PCCM.   Antimicrobials:   Anti-infectives (From admission, onward)    Start     Dose/Rate Route Frequency Ordered Stop   09/23/23 2200  meropenem  (MERREM ) 1 g in sodium chloride  0.9 % 100 mL IVPB        1 g 200 mL/hr over 30 Minutes Intravenous Every 12 hours 09/23/23 1436     09/22/23 1800  meropenem  (MERREM ) 500 mg in sodium chloride  0.9 % 100 mL IVPB  Status:  Discontinued        500 mg 200 mL/hr over 30 Minutes Intravenous Every 12 hours 09/22/23 1647 09/23/23 1436   09/22/23 0930  ceFEPIme  (MAXIPIME ) 2 g in sodium chloride  0.9 % 100 mL IVPB  Status:  Discontinued        2 g 200 mL/hr over 30 Minutes Intravenous Every 24 hours 09/21/23 1048 09/22/23 1647   09/21/23 0930  ceFEPIme  (MAXIPIME ) 2 g in sodium chloride  0.9 % 100 mL IVPB  Status:  Discontinued        2 g 200 mL/hr over 30 Minutes Intravenous Every 24 hours 09/21/23 0834 09/21/23 1048   09/19/23 1015  cefTRIAXone  (ROCEPHIN ) 2 g in sodium chloride  0.9 % 100 mL IVPB  Status:  Discontinued        2 g 200 mL/hr over 30 Minutes Intravenous Every 24 hours 09/19/23 0923 09/21/23 0834        Medications  Scheduled Meds:  amLODipine   5 mg Oral Daily   apixaban   5 mg Oral BID   carvedilol   25 mg Oral BID WC   Chlorhexidine  Gluconate Cloth  6 each Topical Daily   feeding supplement  237 mL Oral BID BM   ferrous sulfate   325 mg Oral BID WC   guaiFENesin   600 mg Oral Q12H   insulin  aspart  0-6 Units Subcutaneous TID WC   insulin  glargine-yfgn  20 Units Subcutaneous Daily   ipratropium  0.5 mg Nebulization BID   isosorbide -hydrALAZINE   2 tablet Oral TID   leptospermum manuka honey  1 Application Topical Daily   pantoprazole   40 mg Oral Daily   Continuous  Infusions:  meropenem  (MERREM ) IV 1 g (09/26/23 0939)   PRN Meds:.acetaminophen  (TYLENOL ) oral liquid 160 mg/5 mL, metoprolol  tartrate, mouth rinse, mouth rinse, mouth rinse, polyethylene glycol    Subjective:   Cory [name] was seen and  examined today. No  new complaints.  Objective:   Vitals:   09/25/23 2341 09/26/23 0406 09/26/23 0932 09/26/23 1142  BP: 123/80 127/86 (!) 156/95 (!) 166/151  Pulse: 81 80 91 64  Resp:  18 16 16   Temp: 98.1 F (36.7 C) 98.1 F (36.7 C) 98.6 F (37 C) 98 F (36.7 C)  TempSrc: Oral Oral Oral Oral  SpO2: 99% 100% 100% 100%  Weight:      Height:        Intake/Output Summary (Last 24 hours) at 09/26/2023 1443 Last data filed at 09/26/2023 1153 Gross per 24 hour  Intake 430.68 ml  Output 1050 ml  Net -619.32 ml   Filed Weights   09/22/23 0500 09/23/23 0500 09/24/23 0752  Weight: 87.5 kg 82 kg 80.5 kg     Exam General exam: Appears calm and comfortable  Respiratory system: Clear to auscultation. Respiratory effort normal. Cardiovascular system: S1 & S2 heard, RRR. No JVD,  Gastrointestinal system: Abdomen is nondistended, soft and nontender.  Central nervous system: Alert and oriented. No focal neurological deficits. Extremities: right BKA.  Skin: No rashes, lesions or ulcers Psychiatry:  Mood & affect appropriate.     Data Reviewed:  I have personally reviewed following labs and imaging studies   CBC Lab Results  Component Value Date   WBC 6.1 09/25/2023   RBC 4.13 (L) 09/25/2023   HGB 10.6 (L) 09/25/2023   HCT 32.0 (L) 09/25/2023   MCV 77.5 (L) 09/25/2023   MCH 25.7 (L) 09/25/2023   PLT 239 09/25/2023   MCHC 33.1 09/25/2023   RDW 14.6 09/25/2023   LYMPHSABS 1.3 09/25/2023   MONOABS 0.8 09/25/2023   EOSABS 0.4 09/25/2023   BASOSABS 0.0 09/25/2023     Last metabolic panel Lab Results  Component Value Date   NA 141 09/25/2023   K 3.9 09/25/2023   CL 112 (H) 09/25/2023   CO2 23 09/25/2023   BUN 21 09/25/2023    CREATININE 1.97 (H) 09/25/2023   GLUCOSE 128 (H) 09/25/2023   GFRNONAA 35 (L) 09/25/2023   CALCIUM  8.0 (L) 09/25/2023   PHOS 3.7 09/20/2023   PROT 5.8 (L) 09/19/2023   ALBUMIN 2.4 (L) 09/19/2023   BILITOT 0.7 09/19/2023   ALKPHOS 56 09/19/2023   AST 38 09/19/2023   ALT 21 09/19/2023   ANIONGAP 6 09/25/2023    CBG (last 3)  Recent Labs    09/26/23 0613 09/26/23 1144 09/26/23 1307  GLUCAP 170* 168* 148*      Coagulation Profile: No results for input(s): "INR", "PROTIME" in the last 168 hours.    Radiology Studies: No results found.     Feliciana Horn M.D. [facility] 09/26/2023, 2:43 PM  Available via Epic secure chat 7am-7pm After 7 pm, please refer to night coverage provider listed on amion.

## 2023-09-26 NOTE — Progress Notes (Signed)
 [facility] night cross cover note:   I was notified by the patient's RN that this patient, who is in atrial fibrillation, with sustained heart rates in the 70s to 80s is exhibiting occasional decrease in heart rate to the 40s, which is transient to baseline heart rate range, as above.  RN also conveys a 3.14 sec pause identified on telemetry.   Other vital signs appear stable, including afebrile, blood pressure checked at this time is reported to be 128/88; respiratory rate 20, and oxygen saturation 100% on room air.  The patient is reported to be asleep at the time of the transition decrease in heart rates.   Appropriate for chart review, it appears that there was interval increase in creatinine per this morning's labs, noting patient currently on coreg .  Will repeat BMP in the morning, including for further monitoring of renal fxn trend and potassium level, and will also check magnesium  level as well as CBC.    Camelia Cavalier, DO Hospitalist

## 2023-09-26 NOTE — Inpatient Diabetes Management (Signed)
 Inpatient Diabetes Program Recommendations  AACE/ADA: New Consensus Statement on Inpatient Glycemic Control (2015)  Target Ranges:  Prepandial:   less than 140 mg/dL      Peak postprandial:   less than 180 mg/dL (1-2 hours)      Critically ill patients:  140 - 180 mg/dL   Lab Results  Component Value Date   GLUCAP 170 (H) 09/26/2023   HGBA1C 11.9 (H) 09/19/2023    Review of Glycemic Control  Latest Reference Range & Units 09/24/23 08:58 09/24/23 11:04 09/24/23 16:23 09/25/23 12:42 09/25/23 16:05 09/25/23 21:44 09/26/23 06:13  Glucose-Capillary 70 - 99 mg/dL 147 (H) 829 (H) 562 (H) 351 (H) 312 (H) 351 (H) 170 (H)  (H): Data is abnormally high  Diabetes history: DM 2 Outpatient Diabetes medications:  Invokana 300 mg daily- not taking Dexcom sensor Lantus  30 units daily Current orders for Inpatient glycemic control:  Novolog  0-6 units q 4 hours Semglee  20 units daily  Inpatient Diabetes Program Recommendations:    Postprandials elevated.  Please consider:  Novolog  3 units [facility] if he eats at least 50%.  Thank you, Hays Lipschutz, MSN, CDCES Diabetes Coordinator Inpatient Diabetes Program [phone] (team pager from 8a-5p)

## 2023-09-26 NOTE — TOC Progression Note (Signed)
 Transition of Care [facility]) - Progression Note    Patient Details  Name: Cory [name] MRN: [id number] Date of Birth: November 16, 1945  [facility] of [facility] CM/SW Contact  [name], [place] Phone Number: 09/26/2023, 3:55 PM  Clinical Narrative:   CSW spoke with niece, [name], to discuss bed offers for SNF. CSW provided offers and sent in a text message for [name] to review. CSW also answered questions about long term concerns and how to find help if the patient is not safe to return to his apartment alone, as she is the only local family and it is a challenge for her with her own family and health issues to continue to support the patient. The patient's family is not local and does not assist. CSW answered questions and provided information, [name] was appreciative. CSW to follow.    Expected Discharge Plan: Skilled Nursing Facility Barriers to Discharge: English as a second language teacher, Continued Medical Work up  Expected Discharge Plan and Services                                               Social Determinants of Health (SDOH) Interventions SDOH Screenings   Food Insecurity: No Food Insecurity (09/18/2023)  Housing: Low Risk  (09/18/2023)  Transportation Needs: No Transportation Needs (09/18/2023)  Utilities: Not At Risk (09/18/2023)  Social Connections: Patient Unable To Answer (09/18/2023)  Tobacco Use: Medium Risk (09/18/2023)    Readmission Risk Interventions     No data to display

## 2023-09-26 NOTE — Progress Notes (Signed)
   Patient Name: Cory [name] Date of Encounter: 09/26/2023 [facility] Cardiologist: Arun K Thukkani, MD   Interval Summary  .    No chest pain or shortness of breath.  Vital Signs .    Vitals:   09/25/23 2341 09/26/23 0406 09/26/23 0932 09/26/23 1142  BP: 123/80 127/86 (!) 156/95 (!) 166/151  Pulse: 81 80 91 64  Resp:  18 16 16   Temp: 98.1 F (36.7 C) 98.1 F (36.7 C) 98.6 F (37 C) 98 F (36.7 C)  TempSrc: Oral Oral Oral Oral  SpO2: 99% 100% 100% 100%  Weight:      Height:        Intake/Output Summary (Last 24 hours) at 09/26/2023 1313 Last data filed at 09/26/2023 1153 Gross per 24 hour  Intake 430.68 ml  Output 1050 ml  Net -619.32 ml      09/24/2023    7:52 AM 09/23/2023    5:00 AM 09/22/2023    5:00 AM  Last 3 Weights  Weight (lbs) 177 lb 7.5 oz 180 lb 12.4 oz 192 lb 14.4 oz  Weight (kg) 80.5 kg 82 kg 87.5 kg      Telemetry/ECG    Atrial flutter with variable block, heart rate in the 80s - Personally Reviewed  Physical Exam .   GEN: No acute distress.   Neck: No JVD Cardiac: Irregularly irregular, no murmurs, rubs, or gallops.  Respiratory: Clear to auscultation bilaterally. GI: Soft, nontender, non-distended  MS: No edema  Assessment & Plan .     1.  Acute HFrEF: LVEF 25 to 30% with global hypokinesis, mild RV dysfunction, and no significant valvular disease.  Felt to possibly have stress-induced cardiomyopathy secondary to sepsis versus tachycardia mediated with atrial flutter with RVR.  GDMT is limited in the setting of the patient's chronic kidney disease.  Would continue carvedilol  and BiDil . 2.  Persistent atrial flutter: Heart rate now much better controlled.  Continue carvedilol  and apixaban .  The patient appears clinically stable from a cardiac perspective.  He has been evaluated by physical medicine and rehab and is pending [facility] inpatient rehab transfer if his insurance is approved.  No new cardiac recommendations today.  Will sign  off.  Please call if any questions arise.  Will arrange outpatient cardiology follow-up.  For questions or updates, please contact [facility] Please consult [email or web address] for contact info under        Signed, Arnoldo Lapping, MD

## 2023-09-26 NOTE — Plan of Care (Signed)
  Problem: Clinical Measurements: Goal: Ability to maintain clinical measurements within normal limits will improve Outcome: Progressing   Problem: Activity: Goal: Risk for activity intolerance will decrease Outcome: Progressing   Problem: Nutrition: Goal: Adequate nutrition will be maintained Outcome: Progressing   Problem: Fluid Volume: Goal: Ability to maintain a balanced intake and output will improve Outcome: Progressing

## 2023-09-27 DIAGNOSIS — I4892 Unspecified atrial flutter: Secondary | ICD-10-CM | POA: Diagnosis not present

## 2023-09-27 DIAGNOSIS — I5021 Acute systolic (congestive) heart failure: Secondary | ICD-10-CM | POA: Diagnosis not present

## 2023-09-27 DIAGNOSIS — I5181 Takotsubo syndrome: Secondary | ICD-10-CM | POA: Diagnosis not present

## 2023-09-27 DIAGNOSIS — G9340 Encephalopathy, unspecified: Secondary | ICD-10-CM | POA: Diagnosis not present

## 2023-09-27 LAB — GLUCOSE, CAPILLARY
Glucose-Capillary: 78 mg/dL (ref 70–99)
Glucose-Capillary: 131 mg/dL — ABNORMAL HIGH (ref 70–99)
Glucose-Capillary: 201 mg/dL — ABNORMAL HIGH (ref 70–99)
Glucose-Capillary: 234 mg/dL — ABNORMAL HIGH (ref 70–99)
Glucose-Capillary: 249 mg/dL — ABNORMAL HIGH (ref 70–99)

## 2023-09-27 LAB — CBC
HCT: 29.5 % — ABNORMAL LOW (ref 39.0–52.0)
Hemoglobin: 10 g/dL — ABNORMAL LOW (ref 13.0–17.0)
MCH: 25.8 pg — ABNORMAL LOW (ref 26.0–34.0)
MCHC: 33.9 g/dL (ref 30.0–36.0)
MCV: 76.2 fL — ABNORMAL LOW (ref 80.0–100.0)
Platelets: 293 10*3/uL (ref 150–400)
RBC: 3.87 MIL/uL — ABNORMAL LOW (ref 4.22–5.81)
RDW: 14.7 % (ref 11.5–15.5)
WBC: 4.6 10*3/uL (ref 4.0–10.5)
nRBC: 0 % (ref 0.0–0.2)

## 2023-09-27 LAB — BASIC METABOLIC PANEL WITH GFR
Anion gap: 6 (ref 5–15)
BUN: 24 mg/dL — ABNORMAL HIGH (ref 8–23)
CO2: 22 mmol/L (ref 22–32)
Calcium: 7.8 mg/dL — ABNORMAL LOW (ref 8.9–10.3)
Chloride: 109 mmol/L (ref 98–111)
Creatinine, Ser: 1.89 mg/dL — ABNORMAL HIGH (ref 0.61–1.24)
GFR, Estimated: 36 mL/min — ABNORMAL LOW (ref >60)
Glucose, Bld: 76 mg/dL (ref 70–99)
Potassium: 3.5 mmol/L (ref 3.5–5.1)
Sodium: 137 mmol/L (ref 135–145)

## 2023-09-27 LAB — MAGNESIUM: Magnesium: 2 mg/dL (ref 1.7–2.4)

## 2023-09-27 MED ORDER — GUAIFENESIN ER 600 MG PO TB12
600.0000 mg | ORAL_TABLET | Freq: Two times a day (BID) | ORAL | Status: DC
  Administered 2023-09-27 – 2023-09-29 (×4): 600 mg via ORAL
  Filled 2023-09-27 (×4): qty 1

## 2023-09-27 MED ORDER — INSULIN ASPART 100 UNIT/ML IJ SOLN
2.0000 [IU] | Freq: Three times a day (TID) | INTRAMUSCULAR | Status: DC
Start: 2023-09-28 — End: 2023-09-28
  Administered 2023-09-28: 2 [IU] via SUBCUTANEOUS

## 2023-09-27 MED ORDER — ATORVASTATIN CALCIUM 10 MG PO TABS
20.0000 mg | ORAL_TABLET | Freq: Every day | ORAL | Status: DC
  Administered 2023-09-27 – 2023-09-29 (×3): 20 mg via ORAL
  Filled 2023-09-27 (×3): qty 2

## 2023-09-27 MED ORDER — SERTRALINE HCL 50 MG PO TABS
50.0000 mg | ORAL_TABLET | Freq: Every day | ORAL | Status: DC
Start: 2023-09-27 — End: 2023-09-29
  Administered 2023-09-27 – 2023-09-29 (×3): 50 mg via ORAL
  Filled 2023-09-27 (×3): qty 1

## 2023-09-27 MED ORDER — TAMSULOSIN HCL 0.4 MG PO CAPS
0.4000 mg | ORAL_CAPSULE | Freq: Every day | ORAL | Status: DC
  Administered 2023-09-27 – 2023-09-28 (×2): 0.4 mg via ORAL
  Filled 2023-09-27 (×2): qty 1

## 2023-09-27 MED ORDER — INSULIN ASPART 100 UNIT/ML IJ SOLN: 3.0000 [IU] | Freq: Three times a day (TID) | INTRAMUSCULAR | Status: DC

## 2023-09-27 MED ORDER — IPRATROPIUM BROMIDE 0.02 % IN SOLN: 0.5000 mg | Freq: Four times a day (QID) | RESPIRATORY_TRACT | Status: DC | PRN

## 2023-09-27 MED ORDER — GUAIFENESIN ER 600 MG PO TB12
600.0000 mg | ORAL_TABLET | Freq: Two times a day (BID) | ORAL | Status: DC
  Filled 2023-09-27: qty 1

## 2023-09-27 NOTE — Progress Notes (Signed)
 Heart Failure Navigator Progress Note  Assessed for Heart & Vascular TOC clinic readiness.  Pending discharge to CIR. [facility] appt scheduled for 6/5.   Navigator available for reassessment of patient.   Jerilyn Monte, PharmD, BCPS Heart Failure Stewardship Pharmacist Phone [phone]

## 2023-09-27 NOTE — Progress Notes (Signed)
 Occupational Therapy Treatment Patient Details Name: Cory [name] MRN: [id number] DOB: 02-Feb-1946 Today's Date: 09/27/2023   History of present illness Pt is a 78 yo male found down by his aid and found to have severe encephalopathy, DKA, CRF. CT with no large findings. PMH: sz d/o, RBKA 2022, aflutter, CKD, colon CA, prostate, CA, DM, HTN, CVA, R pupil fixed adn dialated chronically.   OT comments  Pt more alert and oriented today, able to complete simple commands and ADLs with mod A to CGA during session. Pt also ambulatory in room with min A to CGA +2 for safety, HR remains in afib, pt needing verbal/gestural cues for safety. OT to continue to progress pt as able. DC plans updated to skilled rehab <3hrs of therapy as pt has no support in the immediate area, pt also concerned of how he can go about paying bills.       If plan is discharge home, recommend the following:  Assistance with cooking/housework;Direct supervision/assist for medications management;Direct supervision/assist for financial management;Assist for transportation;Supervision due to cognitive status;A lot of help with bathing/dressing/bathroom   Equipment Recommendations  None recommended by OT    Recommendations for Other Services      Precautions / Restrictions Precautions Precautions: Fall;Other (comment) Recall of Precautions/Restrictions: Impaired Required Braces or Orthoses: Other Brace Other Brace: RLE prosthetic Restrictions Weight Bearing Restrictions Per Provider Order: No       Mobility Bed Mobility Overal bed mobility: Needs Assistance Bed Mobility: Supine to Sit     Supine to sit: Supervision     General bed mobility comments: Increased time however no physical assistance needed. Left seated EOB with alarm on and tray in front of him    Transfers Overall transfer level: Needs assistance Equipment used: Rolling walker (2 wheels) Transfers: Sit to/from Stand Sit to Stand: Contact guard  assist, Min assist           General transfer comment: Min A on initial sit to stand. Cues for hand placement and sequencing. Min A to power up and steady. Performed a second STS with cues for better body position and use of anterior lean to perfrom STS with CGA. verbal cues for controlled descent     Balance Overall balance assessment: Needs assistance Sitting-balance support: Feet supported Sitting balance-Leahy Scale: Fair     Standing balance support: During functional activity, Reliant on assistive device for balance, Bilateral upper extremity supported Standing balance-Leahy Scale: Poor Standing balance comment: reliant on RW                           ADL either performed or assessed with clinical judgement   ADL   Eating/Feeding: Sitting;Set up   Grooming: Sitting;Wash/dry hands;Set up       Lower Body Bathing: Sitting/lateral leans;Contact guard assist       Lower Body Dressing: Sitting/lateral leans;Moderate assistance (don L shoe)               Functional mobility during ADLs: Minimal assistance;Rolling walker (2 wheels) (CGA +2 to intermittent min A)      Extremity/Trunk Assessment              Vision       Perception     Praxis     Communication Communication Communication: Impaired Factors Affecting Communication: Hearing impaired   Cognition  Cueing      Exercises      Shoulder Instructions       General Comments HR up to 150 bpm with actiity, RN made aware. Pt also in afib throughout session fluctuating from 98 to 138 normally with activity    Pertinent Vitals/ Pain          Home Living                                          Prior Functioning/Environment              Frequency  Min 2X/week        Progress Toward Goals  OT Goals(current goals can now be found in the care plan section)  Progress towards OT goals:  Progressing toward goals  Acute Rehab OT Goals OT Goal Formulation: With patient/family Time For Goal Achievement: 10/06/23 Potential to Achieve Goals: Good  Plan      Co-evaluation      Reason for Co-Treatment: Other (comment) (progress gait)   OT goals addressed during session: ADL's and self-care      AM-PAC OT "6 Clicks" Daily Activity     Outcome Measure   Help from another person eating meals?: A Little Help from another person taking care of personal grooming?: A Little Help from another person toileting, which includes using toliet, bedpan, or urinal?: A Lot Help from another person bathing (including washing, rinsing, drying)?: A Little Help from another person to put on and taking off regular upper body clothing?: A Lot Help from another person to put on and taking off regular lower body clothing?: A Lot 6 Click Score: 15    End of Session Equipment Utilized During Treatment: Rolling walker (2 wheels);Gait belt  OT Visit Diagnosis: Unsteadiness on feet (R26.81);Other abnormalities of gait and mobility (R26.89);Other symptoms and signs involving cognitive function   Activity Tolerance Patient tolerated treatment well   Patient Left in bed;with call bell/phone within reach;Other (comment);with bed alarm set (sitting EOB, alarm set to exit mode)   Nurse Communication Mobility status        Time: 1610-9604 OT Time Calculation (min): 22 min  Charges: OT General Charges $OT Visit: 1 Visit  09/27/2023  AB, OTR/L  Acute Rehabilitation Services  Office: [phone]   Cory [name] 09/27/2023, 5:55 PM

## 2023-09-27 NOTE — TOC Progression Note (Signed)
 Transition of Care [facility]) - Progression Note    Patient Details  Name: Cory [name] MRN: [id number] Date of Birth: [birth year]  Transition of Care [facility]) CM/SW Contact  [name], [place] Phone Number: 09/27/2023, 4:08 PM  Clinical Narrative:   CSW spoke with niece, [name], to discuss SNF choices. [name]. CSW confirmed bed availability with [facility], will initiate insurance authorization once therapy updates are in; assigned to see patient today. CSW to follow.    Expected Discharge Plan: Skilled Nursing Facility Barriers to Discharge: English as a second language teacher, Continued Medical Work up  Expected Discharge Plan and Services                                               Social Determinants of Health (SDOH) Interventions SDOH Screenings   Food Insecurity: No Food Insecurity (09/18/2023)  Housing: Low Risk  (09/18/2023)  Transportation Needs: No Transportation Needs (09/18/2023)  Utilities: Not At Risk (09/18/2023)  Social Connections: Patient Unable To Answer (09/18/2023)  Tobacco Use: Medium Risk (09/18/2023)    Readmission Risk Interventions     No data to display

## 2023-09-27 NOTE — Inpatient Diabetes Management (Signed)
 Inpatient Diabetes Program Recommendations  AACE/ADA: New Consensus Statement on Inpatient Glycemic Control (2015)  Target Ranges:  Prepandial:   less than 140 mg/dL      Peak postprandial:   less than 180 mg/dL (1-2 hours)      Critically ill patients:  140 - 180 mg/dL   Lab Results  Component Value Date   GLUCAP 78 09/27/2023   HGBA1C 11.9 (H) 09/19/2023    Review of Glycemic Control  Latest Reference Range & Units 09/26/23 06:13 09/26/23 11:44 09/26/23 13:07 09/26/23 16:11 09/26/23 20:11 09/27/23 06:22  Glucose-Capillary 70 - 99 mg/dL 784 (H) 696 (H) 295 (H) 280 (H) 357 (H) 78  (H): Data is abnormally high  Diabetes history: DM 2 Outpatient Diabetes medications:  Invokana 300 mg daily- not taking Dexcom sensor Lantus  30 units daily Current orders for Inpatient glycemic control:  Novolog  0-6 units q 4 hours Semglee  20 units daily   Inpatient Diabetes Program Recommendations:     Postprandials elevated.  Please consider:   Novolog  3 units [facility] if he eats at least 50%.   Thank you, Hays Lipschutz, MSN, CDCES Diabetes Coordinator Inpatient Diabetes Program [phone] (team pager from 8a-5p)

## 2023-09-27 NOTE — Progress Notes (Signed)
 [facility]                                                                               [place], is a 78 y.o. male, DOB - 1946-01-27, ZOX:[id number] Admit date - 09/18/2023    Outpatient Primary MD for the patient is Yvonnie Heritage, Lavonia Powers., MD  LOS - 9  days    Brief summary   Cory [name] is a 78 y.o. male with a PMH significant for DM, CKD, colon cancer in splenic flexure, a flutter on Eliquis , HTN, previous seizures secondary to hyperglycemia, previous encephalopathy, BKA in 2022, previous CVA who lives at home by himself and was found down by caregiver after unknown time.  He was found to have hematoma to left forehead therefore level 1 trauma activated. GCS 5 on arrival with pill rolling motions noted in BUEs. Intubated for airway protection. Workup with CT head, chest, abd/pelvis negative for traumatic injuries. Labs noted for glucose 437, BUN/ sCr 26/ 2.25, t. Bili 1.3, albumin 3.4, CK 621, lactic 2.2, INR 1.3, H/H 13.9/ 42.  5/12 Admitted, intubated and sedated 5/13 EEG neg 5/14 extubated. No sz on eeg . He was transferred to [facility] on 5/16.   currently waiting for SNF Placement.    Assessment & Plan    Assessment and Plan:  Acute metabolic encephalopathy  in the setting of prior CVA  From DKA , AKI and lactic acidosis.  UDS is negative. CT head negative. Ammonia level negative.  spot EEG 5/12> severe diffuse encephalopathy, no seizures/ epileptiform discharges. No sz captured on cEEG through 5/14  He appears to be back to baseline.  Therapy eval recommending SNF.     Unwitnessed fall with left scalp hematoma.  Pain control.    Acute respiratory failure with hypoxia/ sepsis ? From aspiration.  Completed the course of antibiotics.  Patient is on RA and denies any sob.    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   Hypokalemia Replaced. Repeat levels wnl.   Acute systolic CHF Echo showed LVEF of 25% to 30 % .  ?  Stress cardiomyopathy.  On coreg  12.5 mg BID, which was increased to 25 mg BID.and Bidil  20/37.5 TID.   Holding the ARB/Entresto and spironolactone given AKI.  Cardiology on board.  Would avoid SGLT2, ACE/ARB in the setting of CKD stage III    Atrial flutter On eliquis  5 mg BID.  On Coreg  25 mg BID.    Hyperlipidemia Restart statin today .     Anemia of acute illness. / iron  deficiency anemia.  Microcytic , hemoglobin around 9.8  Iron  supplementation on board.   Hypertension BP parameters are optimal.   Type 2 DM with hyperglycemia CBG (last 3)  Recent Labs    09/26/23 2011 09/27/23 0622 09/27/23 1139  GLUCAP 357* 78 131*   Continue with Semglee  20 units daily, and SSI. 2 units of novolog  added to the regimen.  A1c is 11.9  RN Pressure Injury Documentation: Pressure Injury 09/18/23 Sacrum Medial Stage 3 -  Full thickness tissue loss. Subcutaneous fat may be visible but bone, tendon or muscle are NOT exposed. Stg  3 (Active)  09/18/23 1439  Location: Sacrum  Location Orientation: Medial  Staging: Stage 3 -  Full thickness tissue loss. Subcutaneous fat may be visible but bone, tendon or muscle are NOT exposed.  Wound Description (Comments): Stg 3  Present on Admission: Yes  Dressing Type Foam - Lift dressing to assess site every shift 09/26/23 2030  Foam dressing in place. Local wound care.   Malnutrition Type:  Nutrition Problem: Inadequate oral intake Etiology: acute illness   Malnutrition Characteristics:  Signs/Symptoms: NPO status   Nutrition Interventions:  Interventions: Ensure Enlive (each supplement provides 350kcal and 20 grams of protein), Other (Comment) (feeding assistance)  Estimated body mass index is 26.01 kg/m as calculated from the following:   Height as of this encounter: 5\' 11"  (1.803 m).   Weight as of this encounter: 84.6 kg.  Code Status: full code.  DVT Prophylaxis:  Place and maintain sequential compression device Start:  09/18/23 1549 apixaban  (ELIQUIS ) tablet 5 mg   Level of Care: Level of care: Progressive Family Communication: none at bedside.   Disposition Plan:     Remains inpatient appropriate:  medically stable for discharge. For SNF.  Procedures:  EEG Extubation.   Consultants:   Cardiology PCCM.   Antimicrobials:   Anti-infectives (From admission, onward)    Start     Dose/Rate Route Frequency Ordered Stop   09/23/23 2200  meropenem  (MERREM ) 1 g in sodium chloride  0.9 % 100 mL IVPB        1 g 200 mL/hr over 30 Minutes Intravenous Every 12 hours 09/23/23 1436     09/22/23 1800  meropenem  (MERREM ) 500 mg in sodium chloride  0.9 % 100 mL IVPB  Status:  Discontinued        500 mg 200 mL/hr over 30 Minutes Intravenous Every 12 hours 09/22/23 1647 09/23/23 1436   09/22/23 0930  ceFEPIme  (MAXIPIME ) 2 g in sodium chloride  0.9 % 100 mL IVPB  Status:  Discontinued        2 g 200 mL/hr over 30 Minutes Intravenous Every 24 hours 09/21/23 1048 09/22/23 1647   09/21/23 0930  ceFEPIme  (MAXIPIME ) 2 g in sodium chloride  0.9 % 100 mL IVPB  Status:  Discontinued        2 g 200 mL/hr over 30 Minutes Intravenous Every 24 hours 09/21/23 0834 09/21/23 1048   09/19/23 1015  cefTRIAXone  (ROCEPHIN ) 2 g in sodium chloride  0.9 % 100 mL IVPB  Status:  Discontinued        2 g 200 mL/hr over 30 Minutes Intravenous Every 24 hours 09/19/23 0923 09/21/23 0834        Medications  Scheduled Meds:  amLODipine   5 mg Oral Daily   apixaban   5 mg Oral BID   carvedilol   25 mg Oral BID WC   Chlorhexidine  Gluconate Cloth  6 each Topical Daily   feeding supplement  237 mL Oral BID BM   ferrous sulfate   325 mg Oral BID WC   guaiFENesin   600 mg Oral Q12H   insulin  aspart  0-5 Units Subcutaneous QHS   insulin  aspart  0-6 Units Subcutaneous TID WC   insulin  aspart  3 Units Subcutaneous TID WC   insulin  glargine-yfgn  20 Units Subcutaneous Daily   isosorbide -hydrALAZINE   2 tablet Oral TID   leptospermum manuka honey  1  Application Topical Daily   pantoprazole   40 mg Oral Daily   sertraline  50 mg Oral Daily   tamsulosin   0.4 mg Oral QPC supper  Continuous Infusions:  meropenem  (MERREM ) IV 1 g (09/27/23 0918)   PRN Meds:.acetaminophen  (TYLENOL ) oral liquid 160 mg/5 mL, ipratropium, metoprolol  tartrate, mouth rinse, mouth rinse, mouth rinse, polyethylene glycol    Subjective:   Cory [name] was seen and examined today. No new complaints.  Objective:   Vitals:   09/27/23 0747 09/27/23 0831 09/27/23 0914 09/27/23 1142  BP: (!) 157/98 (!) 156/91 133/69 127/89  Pulse: (!) 138 68 (!) 49 (!) 46  Resp: 20 16 16 20   Temp: 98.1 F (36.7 C) 98.1 F (36.7 C)  98.2 F (36.8 C)  TempSrc: Oral Oral  Oral  SpO2: 99% 99% 99% 99%  Weight:      Height:        Intake/Output Summary (Last 24 hours) at 09/27/2023 1249 Last data filed at 09/27/2023 0915 Gross per 24 hour  Intake 840 ml  Output 950 ml  Net -110 ml   Filed Weights   09/23/23 0500 09/24/23 0752 09/27/23 0500  Weight: 82 kg 80.5 kg 84.6 kg     Exam General exam: Appears calm and comfortable  Respiratory system: Clear to auscultation. Respiratory effort normal. Cardiovascular system: S1 & S2 heard, RRR. No JVD, Gastrointestinal system: Abdomen is nondistended, soft and nontender.  Central nervous system: Alert and oriented. Extremities: right BKA.  Skin: No rashes,  Psychiatry: . Mood & affect appropriate.     Data Reviewed:  I have personally reviewed following labs and imaging studies   CBC Lab Results  Component Value Date   WBC 4.6 09/27/2023   RBC 3.87 (L) 09/27/2023   HGB 10.0 (L) 09/27/2023   HCT 29.5 (L) 09/27/2023   MCV 76.2 (L) 09/27/2023   MCH 25.8 (L) 09/27/2023   PLT 293 09/27/2023   MCHC 33.9 09/27/2023   RDW 14.7 09/27/2023   LYMPHSABS 1.3 09/25/2023   MONOABS 0.8 09/25/2023   EOSABS 0.4 09/25/2023   BASOSABS 0.0 09/25/2023     Last metabolic panel Lab Results  Component Value Date   NA 137  09/27/2023   K 3.5 09/27/2023   CL 109 09/27/2023   CO2 22 09/27/2023   BUN 24 (H) 09/27/2023   CREATININE 1.89 (H) 09/27/2023   GLUCOSE 76 09/27/2023   GFRNONAA 36 (L) 09/27/2023   CALCIUM  7.8 (L) 09/27/2023   PHOS 3.7 09/20/2023   PROT 5.8 (L) 09/19/2023   ALBUMIN 2.4 (L) 09/19/2023   BILITOT 0.7 09/19/2023   ALKPHOS 56 09/19/2023   AST 38 09/19/2023   ALT 21 09/19/2023   ANIONGAP 6 09/27/2023    CBG (last 3)  Recent Labs    09/26/23 2011 09/27/23 0622 09/27/23 1139  GLUCAP 357* 78 131*      Coagulation Profile: No results for input(s): "INR", "PROTIME" in the last 168 hours.    Radiology Studies: No results found.     Feliciana Horn M.D. [facility] 09/27/2023, 12:49 PM  Available via Epic secure chat 7am-7pm After 7 pm, please refer to night coverage provider listed on amion.

## 2023-09-27 NOTE — Plan of Care (Signed)
   Problem: Activity: Goal: Risk for activity intolerance will decrease Outcome: Progressing   Problem: Nutrition: Goal: Adequate nutrition will be maintained Outcome: Progressing

## 2023-09-27 NOTE — Progress Notes (Signed)
 Physical Therapy Treatment Patient Details Name: Cory [name] MRN: [id number] DOB: 1946-01-26 Today's Date: 09/27/2023   History of Present Illness Pt is a 78 yo male found down by his aid and found to have severe encephalopathy, DKA, CRF. CT with no large findings. PMH: sz d/o, RBKA 2022, aflutter, CKD, colon CA, prostate, CA, DM, HTN, CVA, R pupil fixed adn dialated chronically.    PT Comments  Pt tolerated treatment well today. Co-treat with OT. Pt able to ambulate to door and back with steppage gait pattern using RW. +2 for safety. Further ambulation deferred due to HR increasing to 151 while ambulating. DC recs updated to SNF as family stated that they cannot provide 24/7 support upon DC. PT will continue to follow.     If plan is discharge home, recommend the following: A little help with bathing/dressing/bathroom;Assistance with cooking/housework;Assist for transportation;Help with stairs or ramp for entrance   Can travel by private vehicle     No  Equipment Recommendations  None recommended by PT    Recommendations for Other Services       Precautions / Restrictions Precautions Precautions: Fall;Other (comment) Recall of Precautions/Restrictions: Impaired Other Brace: RLE prosthetic Restrictions Weight Bearing Restrictions Per Provider Order: No     Mobility  Bed Mobility Overal bed mobility: Needs Assistance Bed Mobility: Supine to Sit     Supine to sit: Supervision     General bed mobility comments: Increased time however no physical assistance needed. Left seated EOB.    Transfers Overall transfer level: Needs assistance Equipment used: Rolling walker (2 wheels) Transfers: Sit to/from Stand Sit to Stand: Contact guard assist, Min assist           General transfer comment: Min A on initial sit to stand. Cues for hand placement and sequencing. Min A to power up and steady.    Ambulation/Gait Ambulation/Gait assistance: Contact guard assist, +2  safety/equipment, Min assist Gait Distance (Feet): 20 Feet Assistive device: Rolling walker (2 wheels) Gait Pattern/deviations: Trunk flexed, Decreased stride length, Step-through pattern, Steppage Gait velocity: decreased     General Gait Details: Pt able to ambulate to door and back with steppage gait pattern using RW. +2 for safety. Further ambulation deferred due to HR increasing to 151 while ambulating.   Stairs             Wheelchair Mobility     Tilt Bed    Modified Rankin (Stroke Patients Only)       Balance Overall balance assessment: Needs assistance Sitting-balance support: Feet supported Sitting balance-Leahy Scale: Fair     Standing balance support: During functional activity, Reliant on assistive device for balance, Bilateral upper extremity supported Standing balance-Leahy Scale: Poor Standing balance comment: reliant on RW                            Communication Communication Communication: Impaired Factors Affecting Communication: Hearing impaired  Cognition Arousal: Alert Behavior During Therapy: Flat affect   PT - Cognitive impairments: Problem solving, Safety/Judgement, Sequencing                       PT - Cognition Comments: Pt more alert today however noted with slow processing Following commands: Intact      Cueing Cueing Techniques: Verbal cues  Exercises      General Comments General comments (skin integrity, edema, etc.): HR up to 151. RN made aware.      Pertinent  Vitals/Pain Pain Assessment Pain Assessment: No/denies pain    Home Living                          Prior Function            PT Goals (current goals can now be found in the care plan section) Progress towards PT goals: Progressing toward goals    Frequency    Min 2X/week      PT Plan      Co-evaluation              AM-PAC PT "6 Clicks" Mobility   Outcome Measure  Help needed turning from your back to  your side while in a flat bed without using bedrails?: A Little Help needed moving from lying on your back to sitting on the side of a flat bed without using bedrails?: A Little Help needed moving to and from a bed to a chair (including a wheelchair)?: A Little Help needed standing up from a chair using your arms (e.g., wheelchair or bedside chair)?: A Little Help needed to walk in hospital room?: A Lot Help needed climbing 3-5 steps with a railing? : Total 6 Click Score: 15    End of Session Equipment Utilized During Treatment: Gait belt Activity Tolerance: Patient tolerated treatment well Patient left: in bed;with call bell/phone within reach;with bed alarm set (Seated EOB) Nurse Communication: Mobility status PT Visit Diagnosis: Muscle weakness (generalized) (M62.81)     Time: 1610-9604 PT Time Calculation (min) (ACUTE ONLY): 26 min  Charges:    $Gait Training: 8-22 mins PT General Charges $$ ACUTE PT VISIT: 1 Visit                     Cory [name], PT, DPT Acute Rehab Services [phone]    Cory [name] 09/27/2023, 4:50 PM

## 2023-09-27 NOTE — Plan of Care (Signed)
  Problem: Clinical Measurements: Goal: Ability to maintain clinical measurements within normal limits will improve Outcome: Progressing   Problem: Safety: Goal: Ability to remain free from injury will improve Outcome: Progressing   Problem: Skin Integrity: Goal: Risk for impaired skin integrity will decrease Outcome: Progressing   Problem: Metabolic: Goal: Ability to maintain appropriate glucose levels will improve Outcome: Progressing   Problem: Nutritional: Goal: Maintenance of adequate nutrition will improve Outcome: Progressing   Problem: Skin Integrity: Goal: Risk for impaired skin integrity will decrease Outcome: Progressing

## 2023-09-28 DIAGNOSIS — G9340 Encephalopathy, unspecified: Secondary | ICD-10-CM | POA: Diagnosis not present

## 2023-09-28 LAB — BASIC METABOLIC PANEL WITH GFR
Anion gap: 7 (ref 5–15)
BUN: 20 mg/dL (ref 8–23)
CO2: 20 mmol/L — ABNORMAL LOW (ref 22–32)
Calcium: 8.1 mg/dL — ABNORMAL LOW (ref 8.9–10.3)
Chloride: 110 mmol/L (ref 98–111)
Creatinine, Ser: 1.69 mg/dL — ABNORMAL HIGH (ref 0.61–1.24)
GFR, Estimated: 42 mL/min — ABNORMAL LOW (ref >60)
Glucose, Bld: 179 mg/dL — ABNORMAL HIGH (ref 70–99)
Potassium: 3.8 mmol/L (ref 3.5–5.1)
Sodium: 137 mmol/L (ref 135–145)

## 2023-09-28 LAB — GLUCOSE, CAPILLARY
Glucose-Capillary: 105 mg/dL — ABNORMAL HIGH (ref 70–99)
Glucose-Capillary: 176 mg/dL — ABNORMAL HIGH (ref 70–99)
Glucose-Capillary: 128 mg/dL — ABNORMAL HIGH (ref 70–99)
Glucose-Capillary: 145 mg/dL — ABNORMAL HIGH (ref 70–99)

## 2023-09-28 MED ORDER — INSULIN ASPART 100 UNIT/ML IJ SOLN
3.0000 [IU] | Freq: Three times a day (TID) | INTRAMUSCULAR | Status: DC
  Administered 2023-09-28 (×2): 3 [IU] via SUBCUTANEOUS

## 2023-09-28 NOTE — Plan of Care (Signed)
   Problem: Activity: Goal: Risk for activity intolerance will decrease Outcome: Progressing   Problem: Nutrition: Goal: Adequate nutrition will be maintained Outcome: Progressing

## 2023-09-28 NOTE — Progress Notes (Signed)
 [facility] PROGRESS NOTE    Progress Note  [name]  VZD:[id number] DOB: 1946/02/09 DOA: 09/18/2023 PCP: Janese Medicine., MD     Brief Narrative:   Cory [name] is an 78 y.o. 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  Found to have a hematoma of the left frontal forehead, GCS of 5 on arrival PCCM consulted intubated for airway protection CT of the head chest abdomen and pelvis negative for trauma, was noted to have blood glucose of 400 serum creatinine of 2.5 T. bili 1.3 CK of 600 lactic acid of 2.2.  EEG negative on admission extubated on 09/20/2022 transferred to [facility] on 09/22/2022.  Awaiting placement to skilled nursing facility.   Assessment/Plan:   Acute metabolic encephalopathy in the setting of DKA: UDS is negative CT of the head was negative. EEG showed no evidence of seizures. He appears to be back to baseline physical therapy evaluated the patient awaiting skilled nursing facility placement. PT evaluated the patient probably need skilled nursing facility.  Unwitnessed fall with left scalp hematoma: Pain is controlled.  Acute respiratory failure with hypoxia likely due to sepsis probable aspiration: He has completed an 8-day course of IV meropenem  will discontinue today.  Acute kidney injury on chronic kidney disease stage IIIb: Creatinine returning to baseline with conservative management. Unknown baseline creatinine.  Hypokalemia: Repleted now improved.  Acute systolic heart failure: With a last EF show on 25 to 30%, question stress cardiomyopathy. Was started on Coreg  Coreg  and BiDil . Blood pressure is elevated but improved. Basic metabolic panel is pending this morning,  if creatinine continues to improve can consider ARB. At home he is on Micardis .  Atrial flutter: On Eliquis  and Coreg  now in sinus rhythm.  Hyperlipidemia: Continue statins.  Normocytic anemia/iron  deficiency anemia: Continue iron  supplementation. Will need cancer screening as an outpatient.  Essential hypertension: See heart failure as above continue titrate antihypertensive medication.  Diabetes mellitus type 2 with hyperglycemia: With an A1c of 12.  Currently on long-acting insulin  plus sliding scale we will go ahead and increase meal coverage.  Sacral decubitus ulcer stage III present on admission: RN Pressure Injury Documentation: Pressure Injury 09/18/23 Sacrum Medial Stage 3 -  Full thickness tissue loss. Subcutaneous fat may be visible but bone, tendon or muscle are NOT exposed. Stg 3 (Active)  09/18/23 1439  Location: Sacrum  Location Orientation: Medial  Staging: Stage 3 -  Full thickness tissue loss. Subcutaneous fat may be visible but bone, tendon or muscle are NOT exposed.  Wound Description (Comments): Stg 3  Present on Admission: Yes  Dressing Type Foam - Lift dressing to assess site every shift 09/26/23 2030     DVT prophylaxis: lovenox  Family Communication:none Status is: Inpatient Remains inpatient appropriate because: Awaiting skilled nursing facility placement.    Code Status:     Code Status Orders  (From admission, onward)           Start     Ordered   09/18/23 1202  Full code  Continuous       Question:  By:  Answer:  Consent: discussion documented in EHR   09/18/23 1203           Code Status History  This patient has a current code status but no historical code status.         IV Access:   Peripheral IV   Procedures and diagnostic studies:   No results found.   Medical Consultants:   None.   Subjective:    Blayton Huttner feels better no complaints  Objective:    Vitals:   09/27/23 2357 09/28/23 0500  09/28/23 0757 09/28/23 1009  BP: (!) 144/87 (!) 144/84 (!) 136/91 (!) 163/80  Pulse: 87 77 89 90  Resp: 18 18 18    Temp: 97.7 F (36.5 C) 98.2 F (36.8 C) 98.2 F (36.8 C)   TempSrc: Oral Axillary Oral   SpO2: 100% 100% 100%   Weight:  89.6 kg    Height:       SpO2: 100 % O2 Flow Rate (L/min): 1 L/min FiO2 (%): 40 %   Intake/Output Summary (Last 24 hours) at 09/28/2023 1023 Last data filed at 09/28/2023 1008 Gross per 24 hour  Intake 760 ml  Output 800 ml  Net -40 ml   Filed Weights   09/24/23 0752 09/27/23 0500 09/28/23 0500  Weight: 80.5 kg 84.6 kg 89.6 kg    Exam: General exam: In no acute distress. Respiratory system: Good air movement and clear to auscultation. Cardiovascular system: S1 & S2 heard, RRR. No JVD. Gastrointestinal system: Abdomen is nondistended, soft and nontender.  Extremities: Left BKA Skin: No rashes, lesions or ulcers Psychiatry: Judgement and insight appear normal. Mood & affect appropriate.    Data Reviewed:    Labs: Basic Metabolic Panel: Recent Labs  Lab 09/22/23 0445 09/23/23 0255 09/24/23 0925 09/25/23 0655 09/27/23 0524  NA 145 142 139 141 137  K 3.7 3.5 3.0* 3.9 3.5  CL 111 108 109 112* 109  CO2 24 25 21* 23 22  GLUCOSE 233* 235* 134* 128* 76  BUN 39* 32* 18 21 24*  CREATININE 2.46* 2.32* 1.80* 1.97* 1.89*  CALCIUM  8.7* 8.2* 7.8* 8.0* 7.8*  MG 2.1  --  2.0  --  2.0   GFR Estimated Creatinine Clearance: 35.4 mL/min (A) (by C-G formula based on SCr of 1.89 mg/dL (H)). Liver Function Tests: No results for input(s): "AST", "ALT", "ALKPHOS", "BILITOT", "PROT", "ALBUMIN" in the last 168 hours. No results for input(s): "LIPASE", "AMYLASE" in the last 168 hours. No results for input(s): "AMMONIA" in the last 168 hours. Coagulation profile No results for input(s): "INR", "PROTIME" in the last 168 hours. COVID-19 Labs  No results for input(s): "DDIMER", "FERRITIN", "LDH", "CRP" in the last 72 hours.  No results found for:  "SARSCOV2NAA"  CBC: Recent Labs  Lab 09/22/23 0445 09/23/23 0255 09/25/23 0655 09/27/23 0524  WBC 5.7 6.1 6.1 4.6  NEUTROABS  --   --  3.6  --   HGB 10.7* 9.8* 10.6* 10.0*  HCT 33.5* 29.8* 32.0* 29.5*  MCV 78.8* 78.4* 77.5* 76.2*  PLT 157 181 239 293   Cardiac Enzymes: Recent Labs  Lab 09/22/23 0445 09/26/23 0615  CKTOTAL 184 150   BNP (last 3 results) No results for input(s): "PROBNP" in the last 8760 hours. CBG: Recent Labs  Lab 09/27/23 1139 09/27/23 1610 09/27/23 2107 09/27/23 2239 09/28/23 0606  GLUCAP 131* 201* 234* 249* 105*   D-Dimer: No results for input(s): "DDIMER" in the last 72 hours. Hgb A1c: No results for input(s): "HGBA1C" in the last 72 hours. Lipid Profile: No results for input(s): "CHOL", "HDL", "LDLCALC", "TRIG", "CHOLHDL", "LDLDIRECT" in the last 72 hours. Thyroid function studies:  No results for input(s): "TSH", "T4TOTAL", "T3FREE", "THYROIDAB" in the last 72 hours.  Invalid input(s): "FREET3" Anemia work up: No results for input(s): "VITAMINB12", "FOLATE", "FERRITIN", "TIBC", "IRON ", "RETICCTPCT" in the last 72 hours. Sepsis Labs: Recent Labs  Lab 09/22/23 0445 09/23/23 0255 09/25/23 0655 09/27/23 0524  WBC 5.7 6.1 6.1 4.6   Microbiology Recent Results (from the past 240 hours)  MRSA Next Gen by PCR, Nasal     Status: None   Collection Time: 09/18/23 12:01 PM   Specimen: Nasal Mucosa; Nasal Swab  Result Value Ref Range Status   MRSA by PCR Next Gen NOT DETECTED NOT DETECTED Final    Comment: (NOTE) The GeneXpert MRSA Assay (FDA approved for NASAL specimens only), is one component of a comprehensive MRSA colonization surveillance program. It is not intended to diagnose MRSA infection nor to guide or monitor treatment for MRSA infections. Test performance is not FDA approved in patients less than 35 years old. Performed at [facility], [address] [place], [place] [address]   Respiratory (~20 pathogens) panel by  PCR     Status: None   Collection Time: 09/18/23  4:01 PM   Specimen: Nasopharyngeal Swab; Respiratory  Result Value Ref Range Status   Adenovirus NOT DETECTED NOT DETECTED Final   Coronavirus 229E NOT DETECTED NOT DETECTED Final    Comment: (NOTE) The Coronavirus on the Respiratory Panel, DOES NOT test for the novel  Coronavirus (2019 nCoV)    Coronavirus HKU1 NOT DETECTED NOT DETECTED Final   Coronavirus NL63 NOT DETECTED NOT DETECTED Final   Coronavirus OC43 NOT DETECTED NOT DETECTED Final   Metapneumovirus NOT DETECTED NOT DETECTED Final   Rhinovirus / Enterovirus NOT DETECTED NOT DETECTED Final   Influenza A NOT DETECTED NOT DETECTED Final   Influenza B NOT DETECTED NOT DETECTED Final   Parainfluenza Virus 1 NOT DETECTED NOT DETECTED Final   Parainfluenza Virus 2 NOT DETECTED NOT DETECTED Final   Parainfluenza Virus 3 NOT DETECTED NOT DETECTED Final   Parainfluenza Virus 4 NOT DETECTED NOT DETECTED Final   Respiratory Syncytial Virus NOT DETECTED NOT DETECTED Final   Bordetella pertussis NOT DETECTED NOT DETECTED Final   Bordetella Parapertussis NOT DETECTED NOT DETECTED Final   Chlamydophila pneumoniae NOT DETECTED NOT DETECTED Final   Mycoplasma pneumoniae NOT DETECTED NOT DETECTED Final    Comment: Performed at [facility], [address], [place], [place] [address]  Culture, Respiratory w Gram Stain     Status: None   Collection Time: 09/19/23  9:53 AM   Specimen: Tracheal Aspirate; Respiratory  Result Value Ref Range Status   Specimen Description TRACHEAL ASPIRATE  Final   Special Requests NONE  Final   Gram Stain   Final    ABUNDANT WBC PRESENT, PREDOMINANTLY PMN FEW GRAM NEGATIVE RODS RARE GRAM POSITIVE COCCI Performed at [facility], [address], [place], [place] [address]    Culture   Final    ABUNDANT SERRATIA MARCESCENS MODERATE ESCHERICHIA COLI Confirmed Extended Spectrum Beta-Lactamase Producer (ESBL).  In bloodstream infections from ESBL  organisms, carbapenems are preferred over piperacillin /tazobactam. They are shown to have a lower risk of mortality.    Report Status 09/22/2023 FINAL  Final   Organism ID, Bacteria SERRATIA MARCESCENS  Final   Organism ID, Bacteria ESCHERICHIA COLI  Final      Susceptibility   Escherichia coli - MIC*    AMPICILLIN  >=32 RESISTANT Resistant     CEFEPIME  >=32 RESISTANT Resistant  CEFTAZIDIME RESISTANT Resistant     CEFTRIAXONE  >=64 RESISTANT Resistant     CIPROFLOXACIN >=4 RESISTANT Resistant     GENTAMICIN <=1 SENSITIVE Sensitive     IMIPENEM <=0.25 SENSITIVE Sensitive     TRIMETH /SULFA  >=320 RESISTANT Resistant     AMPICILLIN /SULBACTAM >=32 RESISTANT Resistant     PIP/TAZO <=4 SENSITIVE Sensitive ug/mL    * MODERATE ESCHERICHIA COLI   Serratia marcescens - MIC*    CEFEPIME  <=0.12 SENSITIVE Sensitive     CEFTAZIDIME <=1 SENSITIVE Sensitive     CEFTRIAXONE  <=0.25 SENSITIVE Sensitive     CIPROFLOXACIN <=0.25 SENSITIVE Sensitive     GENTAMICIN <=1 SENSITIVE Sensitive     TRIMETH /SULFA  <=20 SENSITIVE Sensitive     * ABUNDANT SERRATIA MARCESCENS  Culture, blood (Routine X 2) w Reflex to ID Panel     Status: None   Collection Time: 09/20/23  9:25 AM   Specimen: BLOOD LEFT HAND  Result Value Ref Range Status   Specimen Description BLOOD LEFT HAND  Final   Special Requests   Final    BOTTLES DRAWN AEROBIC AND ANAEROBIC Blood Culture results may not be optimal due to an inadequate volume of blood received in culture bottles   Culture   Final    NO GROWTH 5 DAYS Performed at [facility], [address], [place], [place] [address]    Report Status 09/25/2023 FINAL  Final  Culture, blood (Routine X 2) w Reflex to ID Panel     Status: None   Collection Time: 09/20/23  9:30 AM   Specimen: BLOOD LEFT ARM  Result Value Ref Range Status   Specimen Description BLOOD LEFT ARM  Final   Special Requests   Final    BOTTLES DRAWN AEROBIC AND ANAEROBIC Blood Culture results may not be  optimal due to an inadequate volume of blood received in culture bottles   Culture   Final    NO GROWTH 5 DAYS Performed at [facility], [address] [place], [place] [address]    Report Status 09/25/2023 FINAL  Final     Medications:    amLODipine   5 mg Oral Daily   apixaban   5 mg Oral BID   atorvastatin   20 mg Oral Daily   carvedilol   25 mg Oral BID WC   Chlorhexidine  Gluconate Cloth  6 each Topical Daily   feeding supplement  237 mL Oral BID BM   ferrous sulfate   325 mg Oral BID WC   guaiFENesin   600 mg Oral BID   insulin  aspart  0-5 Units Subcutaneous QHS   insulin  aspart  0-6 Units Subcutaneous TID WC   insulin  aspart  2 Units Subcutaneous TID WC   insulin  glargine-yfgn  20 Units Subcutaneous Daily   isosorbide -hydrALAZINE   2 tablet Oral TID   leptospermum manuka honey  1 Application Topical Daily   pantoprazole   40 mg Oral Daily   sertraline  50 mg Oral Daily   tamsulosin   0.4 mg Oral QPC supper   Continuous Infusions:  meropenem  (MERREM ) IV 1 g (09/28/23 1015)      LOS: 10 days   Macdonald Savoy  [facility]  09/28/2023, 10:23 AM

## 2023-09-28 NOTE — TOC Progression Note (Signed)
 Transition of Care [facility]) - Progression Note    Patient Details  Name: Cory [name] MRN: [id number] Date of Birth: April 26, 1946  [facility] CM/SW Contact  [name], [place] Phone Number: 09/28/2023, 3:53 PM  Clinical Narrative:   Patient's insurance remains pending at this time. CSW provided update to niece, [name]. CSW to follow.    Expected Discharge Plan: Skilled Nursing Facility Barriers to Discharge: English as a second language teacher, Continued Medical Work up  Expected Discharge Plan and Services                                               Social Determinants of Health (SDOH) Interventions SDOH Screenings   Food Insecurity: No Food Insecurity (09/18/2023)  Housing: Low Risk  (09/18/2023)  Transportation Needs: No Transportation Needs (09/18/2023)  Utilities: Not At Risk (09/18/2023)  Social Connections: Patient Unable To Answer (09/18/2023)  Tobacco Use: Medium Risk (09/18/2023)    Readmission Risk Interventions     No data to display

## 2023-09-28 NOTE — Plan of Care (Signed)

## 2023-09-29 ENCOUNTER — Encounter: Payer: Self-pay | Admitting: Internal Medicine

## 2023-09-29 ENCOUNTER — Encounter: Payer: Self-pay | Admitting: Pulmonary Disease

## 2023-09-29 DIAGNOSIS — I5020 Unspecified systolic (congestive) heart failure: Secondary | ICD-10-CM | POA: Diagnosis not present

## 2023-09-29 DIAGNOSIS — S0990XA Unspecified injury of head, initial encounter: Secondary | ICD-10-CM | POA: Diagnosis not present

## 2023-09-29 DIAGNOSIS — G9340 Encephalopathy, unspecified: Secondary | ICD-10-CM | POA: Diagnosis not present

## 2023-09-29 LAB — BASIC METABOLIC PANEL WITH GFR
Anion gap: 14 (ref 5–15)
BUN: 21 mg/dL (ref 8–23)
CO2: 15 mmol/L — ABNORMAL LOW (ref 22–32)
Calcium: 8 mg/dL — ABNORMAL LOW (ref 8.9–10.3)
Chloride: 109 mmol/L (ref 98–111)
Creatinine, Ser: 1.81 mg/dL — ABNORMAL HIGH (ref 0.61–1.24)
GFR, Estimated: 38 mL/min — ABNORMAL LOW (ref >60)
Glucose, Bld: 70 mg/dL (ref 70–99)
Potassium: 3.8 mmol/L (ref 3.5–5.1)
Sodium: 138 mmol/L (ref 135–145)

## 2023-09-29 LAB — GLUCOSE, CAPILLARY
Glucose-Capillary: 79 mg/dL (ref 70–99)
Glucose-Capillary: 128 mg/dL — ABNORMAL HIGH (ref 70–99)

## 2023-09-29 MED ORDER — ISOSORB DINITRATE-HYDRALAZINE 20-37.5 MG PO TABS: 2.0000 | ORAL_TABLET | Freq: Three times a day (TID) | ORAL | Status: DC

## 2023-09-29 MED ORDER — AMLODIPINE BESYLATE 5 MG PO TABS: 5.0000 mg | ORAL_TABLET | Freq: Every day | ORAL | Status: AC

## 2023-09-29 MED ORDER — INSULIN ASPART 100 UNIT/ML IJ SOLN: 2.0000 [IU] | Freq: Three times a day (TID) | INTRAMUSCULAR | Status: DC

## 2023-09-29 NOTE — TOC Transition Note (Signed)
 Transition of Care [facility]) - Discharge Note   Patient Details  Name: Cory [name] MRN: [id number] Date of Birth: 1946/04/29  Transition [facility] Care [facility]) CM/SW Contact:  Cory Fam, LCSW Phone Number: 09/29/2023, 11:35 AM   Clinical Narrative:   Patient received insurance approval to admit to SNF, CSW confirmed [facility] has a bed available today. CSW updated MD, patient stable to discharge today. CSW sent discharge information, confirmed receipt. CSW updated niece, Cory [name], about discharge. Transport arranged with [facility] for next available.  Nurse to call report to [phone], Room 1006A.    Final next level of care: Skilled Nursing Facility Barriers to Discharge: Barriers Resolved   Patient Goals and CMS Choice            Discharge Placement              Patient [facility] bed at: [facility] Patient to be transferred to facility by: [facility] Name of family member notified: Cory [name] Patient and family notified of of transfer: 09/29/23  Discharge Plan and Services Additional resources added to the After Visit Summary for                                       Social Drivers of Health (SDOH) Interventions SDOH Screenings   Food Insecurity: No Food Insecurity (09/18/2023)  Housing: Low Risk  (09/18/2023)  Transportation Needs: No Transportation Needs (09/18/2023)  Utilities: Not At Risk (09/18/2023)  Social Connections: Patient Unable To Answer (09/18/2023)  Tobacco Use: Medium Risk (09/18/2023)     Readmission Risk Interventions     No data to display

## 2023-09-29 NOTE — Progress Notes (Signed)
 Occupational Therapy Treatment Patient Details Name: Cory [name] MRN: [id number] DOB: 07-28-1945 Today's Date: 09/29/2023   History of present illness Pt is a 78 yo male found down by his aid and found to have severe encephalopathy, DKA, CRF. CT with no large findings. PMH: sz d/o, RBKA 2022, aflutter, CKD, colon CA, prostate, CA, DM, HTN, CVA, R pupil fixed adn dialated chronically.   OT comments  Pt progressing with balance, able to ambulate to sink with CGA + RW today although it is a slow gait speed. Pt also performing seated bathing with setup at EOB. His cognition also seems to be improving, notified charge RN that pt room phone is not working. OT to continue to progress pt as able. DC plans remain appropriate for snf.       If plan is discharge home, recommend the following:  Assistance with cooking/housework;Direct supervision/assist for medications management;Direct supervision/assist for financial management;Assist for transportation;Supervision due to cognitive status;A lot of help with bathing/dressing/bathroom   Equipment Recommendations  None recommended by OT    Recommendations for Other Services      Precautions / Restrictions Precautions Precautions: Fall;Other (comment) Recall of Precautions/Restrictions: Intact Required Braces or Orthoses: Other Brace Other Brace: RLE prosthetic Restrictions Weight Bearing Restrictions Per Provider Order: No       Mobility Bed Mobility               General bed mobility comments: Pt sitting EOB on arrival, left sitting in recliner    Transfers Overall transfer level: Needs assistance Equipment used: Rolling walker (2 wheels) Transfers: Sit to/from Stand Sit to Stand: Contact guard assist           General transfer comment: CGA STS from EOB, good hand placement.     Balance Overall balance assessment: Needs assistance Sitting-balance support: Feet supported Sitting balance-Leahy Scale: Fair     Standing  balance support: During functional activity, Reliant on assistive device for balance, Bilateral upper extremity supported Standing balance-Leahy Scale: Poor Standing balance comment: reliant on RW                           ADL either performed or assessed with clinical judgement   ADL       Grooming: Standing;Contact guard assist;Oral care;Applying deodorant   Upper Body Bathing: Sitting;Set up   Lower Body Bathing: Set up;Sitting/lateral leans                       Functional mobility during ADLs: Rolling walker (2 wheels);Contact guard assist      Extremity/Trunk Assessment              Vision       Perception     Praxis     Communication Communication Communication: Impaired Factors Affecting Communication: Hearing impaired   Cognition Arousal: Alert Behavior During Therapy: WFL for tasks assessed/performed Cognition: Cognition impaired           Executive functioning impairment (select all impairments): Problem solving OT - Cognition Comments: Pt with improving cognition                 Following commands: Intact        Cueing   Cueing Techniques: Verbal cues  Exercises      Shoulder Instructions       General Comments HR monitor leads loose during activity. HR at end of sessoin noted to be 110 bpm  Pertinent Vitals/ Pain       Pain Assessment Pain Assessment: No/denies pain  Home Living                                          Prior Functioning/Environment              Frequency  Min 2X/week        Progress Toward Goals  OT Goals(current goals can now be found in the care plan section)  Progress towards OT goals: Progressing toward goals  Acute Rehab OT Goals OT Goal Formulation: With patient/family Time For Goal Achievement: 10/06/23 Potential to Achieve Goals: Good  Plan      Co-evaluation                 AM-PAC OT "6 Clicks" Daily Activity     Outcome  Measure   Help from another person eating meals?: A Little Help from another person taking care of personal grooming?: A Little Help from another person toileting, which includes using toliet, bedpan, or urinal?: A Lot Help from another person bathing (including washing, rinsing, drying)?: A Little Help from another person to put on and taking off regular upper body clothing?: A Little Help from another person to put on and taking off regular lower body clothing?: A Little 6 Click Score: 17    End of Session Equipment Utilized During Treatment: Rolling walker (2 wheels);Gait belt  OT Visit Diagnosis: Unsteadiness on feet (R26.81);Other abnormalities of gait and mobility (R26.89);Other symptoms and signs involving cognitive function   Activity Tolerance Patient tolerated treatment well   Patient Left with call bell/phone within reach;in chair   Nurse Communication Mobility status        Time: 1610-9604 OT Time Calculation (min): 30 min  Charges: OT General Charges $OT Visit: 1 Visit OT Treatments $Self Care/Home Management : 8-22 mins $Therapeutic Activity: 8-22 mins  09/29/2023  AB, OTR/L  Acute Rehabilitation Services  Office: [phone]   [name] 09/29/2023, 1:16 PM

## 2023-09-29 NOTE — Progress Notes (Signed)
 PT Cancellation Note  Patient Details Name: Cory [name] MRN: [id number] DOB: December 03, 1945   Cancelled Treatment:    Reason Eval/Treat Not Completed: Patient at procedure or test/unavailable;Patient declined, no reason specified (Pt initially working with OT in am, on second attempt pt declined due to he wanted to eat. Will continue to follow up as able and appropriate.)  Cory [name], DPT, CLT  Acute Rehabilitation Services Office: [phone] (Secure chat preferred)   Cory [name] 09/29/2023, 12:23 PM

## 2023-09-29 NOTE — Discharge Summary (Signed)
 Physician Discharge Summary  Hilton UJW:[id number] DOB: 02/04/1946 DOA: 09/18/2023  PCP: Janese Medicine., MD  Admit date: 09/18/2023 Discharge date: 09/29/2023  Admitted From: Home Disposition:  SNF  Recommendations for Outpatient Follow-up:  Follow up with PCP in 1-2 weeks Please obtain BMP/CBC in one week  Home Health:No Equipment/Devices:None  Discharge Condition:Stable CODE STATUS:Full Diet recommendation: Heart Healthy   Brief/Interim Summary: 78 y.o. 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  Found to have a hematoma of the left frontal forehead, GCS of 5 on arrival PCCM consulted intubated for airway protection CT of the head chest abdomen and pelvis negative for trauma, was noted to have blood glucose of 400 serum creatinine of 2.5 T. bili 1.3 CK of 600 lactic acid of 2.2.  EEG negative on admission extubated on 09/20/2022 transferred to [facility] on 09/22/2022.  Awaiting insurance authorization.   Discharge Diagnoses:  Principal Problem:   Acute encephalopathy Active Problems:   Pressure injury of skin   HFrEF (heart failure with reduced ejection fraction) (HCC)  Acute metabolic encephalopathy likely due to DKA: UDS was negative CT of the head was negative. EEG showed no evidence of seizures he was started on IV insulin  and IV fluids his DKA resolved. Physical therapy evaluated the patient recommended skilled nursing facility.  Unwitnessed fall with left scalp hematoma: Now resolved. Treated conservatively.  Acute respiratory failure with hypoxia leading to sepsis in the setting of aspiration pneumonia: Completed course of antibiotics in house.  Acute kidney injury on chronic kidney  disease stage IIIb: He resolved with IV fluids his creatinine returned to baseline.  Hypokalemia: Replete orally now resolved.  Acute systolic heart failure: With a last 2D echo showed EF of 30% question stress cardiomyopathy. Cardiology was consulted he was started on Coreg  and BiDil  his blood pressure remains well-controlled. He will follow-up with cardiology as an outpatient and see if he is a candidate for ARB as an outpatient.  Atrial flutter: He was started on Coreg  converted to sinus rhythm continue Eliquis .  Hyperlipidemia: Continue statins.   Normocytic anemia/iron  deficiency anemia: Continue iron  supplementation. Will need cancer screening as an outpatient.   Essential hypertension: Continue ARB and Coreg  blood pressure is well-controlled. Had an episode of bradycardia is down during sleep remains asymptomatic.   Diabetes mellitus type 2 with hyperglycemia: With an A1c of 12.   He is not taking a chronic will continue long-acting insulin  plus sliding scale as an outpatient. He came in in DKA likely due to noncompliance.   Discharge Instructions  Discharge Instructions     AMB Referral VBCI Care Management   Complete by: As directed    Diabetes. Takes Lantus /Humalog at home however A1C=11.9%. Needs improved control.  He states he is taking medications.  I also talked to his niece who helps care for him.   Expected date of contact: Emergent - 3 Days   Service: Pharmacy   Pharmacy Service For:  Disease Management Medication Adherence     Disease states to manage: Diabetes   Amb Referral to Intravenous Iron  Therapy   Complete by: As directed    You have been referred to [facility]fusion team for IV Iron  Infusions. The infusion pharmacy team will reach  out to you with appointment information.    Primary Diagnosis Code for IV Iron : D50.9 - Iron  deficiency Anemia   Secondary diagnosis code for IV iron : I50.xx - Heart failure related dx codes   Diet - low sodium  heart healthy   Complete by: As directed    Increase activity slowly   Complete by: As directed    No wound care   Complete by: As directed       Allergies as of 09/29/2023   No Known Allergies      Medication List     STOP taking these medications    Invokana 300 MG Tabs tablet Generic drug: canagliflozin   Kerendia 10 MG Tabs Generic drug: Finerenone   telmisartan  40 MG tablet Commonly known as: MICARDIS    vitamin B-12 500 MCG tablet Commonly known as: CYANOCOBALAMIN        TAKE these medications    acetaminophen  325 MG tablet Commonly known as: TYLENOL  Take 650 mg by mouth every 6 (six) hours as needed for mild pain (pain score 1-3) or moderate pain (pain score 4-6).   amLODipine  5 MG tablet Commonly known as: NORVASC  Take 1 tablet (5 mg total) by mouth daily. Start taking on: Sep 30, 2023   atorvastatin  20 MG tablet Commonly known as: LIPITOR Take 20 mg by mouth at bedtime.   carvedilol  25 MG tablet Commonly known as: COREG  Take 25 mg by mouth 2 (two) times daily.   Dexcom G6 Sensor Misc Inject 1 Device into the skin See admin instructions. Change device every 10 days.   Eliquis  5 MG Tabs tablet Generic drug: apixaban  Take 5 mg by mouth 2 (two) times daily.   insulin  lispro 100 UNIT/ML injection Commonly known as: HUMALOG Inject 0-9 Units into the skin with breakfast, with lunch, and with evening meal. Inject 0-9 Units into the skin 3 (three) times daily with meals.  CBG < 70: Implement Hypoglycemia Standing Orders and refer to Hypoglycemia Standing Orders sidebar report  CBG 70 - 120: 0 units  CBG 121 - 150: 1 unit  CBG 151 - 200: 2 units  CBG 201 - 250: 3 units  CBG 251 - 300: 5 units  CBG 301 - 350: 7 units  CBG 351 - 400: 9 units  CBG > 400: call MD and obtain STAT lab verification   isosorbide -hydrALAZINE  20-37.5 MG tablet Commonly known as: BIDIL  Take 2 tablets by mouth 3 (three) times daily.   Lantus  SoloStar 100 UNIT/ML Solostar  Pen Generic drug: insulin  glargine Inject 30 Units into the skin at bedtime.   pantoprazole  40 MG tablet Commonly known as: PROTONIX  Take 40 mg by mouth 2 (two) times daily.   potassium chloride  10 MEQ tablet Commonly known as: KLOR-CON  Take 20 mEq by mouth daily.   sertraline 50 MG tablet Commonly known as: ZOLOFT Take 50 mg by mouth daily.   tamsulosin  0.4 MG Caps capsule Commonly known as: FLOMAX  Take 0.4 mg by mouth daily after supper.   torsemide 20 MG tablet Commonly known as: DEMADEX Take 20 mg by mouth daily.   Vitamin D (Ergocalciferol) 1.25 MG (50000 UNIT) Caps capsule Commonly known as: DRISDOL Take 50,000 Units by mouth once a week.        No Known Allergies  Consultations: PCCM   Procedures/Studies: DG Chest 1 View Result Date: 09/20/2023 CLINICAL DATA:  Acute hypoxic respiratory failure EXAM: CHEST  1 VIEW COMPARISON:  09/18/2023 FINDINGS: Endotracheal tube tip 1.9 cm above the carina. Nasogastric tube  terminates in the stomach. Atheromatous vascular calcifications of the thoracic aorta. Thoracic spondylosis. Heart size within normal limits. The lungs appear clear. IMPRESSION: 1. Endotracheal tube tip 1.9 cm above the carina. 2. Nasogastric tube terminates in the stomach. 3. Thoracic spondylosis. Electronically Signed   By: Freida Jes M.D.   On: 09/20/2023 14:39   Overnight EEG with video Result Date: 09/20/2023 Arleene Lack, MD     09/21/2023 10:12 AM Patient Name: Navi Erber MRN: [id number] Epilepsy Attending: Arleene Lack Referring Physician/Provider: Imogene Mana, NP Duration: 09/19/2023 1917 to 09/20/2023 1917 Patient history: 78yo M with h/o seizures came with fall. EEG to evaluate for seizure  Level of alertness: comatose  AEDs during EEG study: propofol , LEV  Technical aspects: This EEG study was done with scalp electrodes positioned according to the 10-20 International system of electrode placement. Electrical activity was reviewed  with band pass filter of 1-70Hz , sensitivity of 7 uV/mm, display speed of 35mm/sec with a 60Hz  notched filter applied as appropriate. EEG data were recorded continuously and digitally stored.  Video monitoring was available and reviewed as appropriate.  Description: EEG showed continuous generalized 3-5hz  theta- delta slowing. Hyperventilation and photic stimulation were not performed.    ABNORMALITY - Continuous slow, generalized  IMPRESSION: This study is suggestive of severe diffuse encephalopathy. No seizures or epileptiform discharges were seen throughout the recording.   Priyanka O Yadav    US  RENAL Result Date: 09/19/2023 CLINICAL DATA:  Acute kidney injury EXAM: RENAL / URINARY TRACT ULTRASOUND COMPLETE COMPARISON:  CT 09/18/2023 FINDINGS: Right Kidney: Renal measurements: 12 x 4.1 x 4.4 cm = volume: 112.5 mL. Patchy areas of increased echogenicity. No mass or hydronephrosis Left Kidney: Renal measurements: 9.6 x 4.6 x 3.8 cm = volume: 87.8 mL. Patchy areas of increased cortical echogenicity. No mass or hydronephrosis Bladder: Decompressed by Foley catheter Other: None. IMPRESSION: No hydronephrosis. Patchy areas of increased cortical echogenicity bilaterally consistent with medical renal disease. Electronically Signed   By: Esmeralda Hedge M.D.   On: 09/19/2023 19:02   ECHOCARDIOGRAM COMPLETE Result Date: 09/19/2023    ECHOCARDIOGRAM REPORT   Patient Name:   Burhan Barham Date of Exam: 09/19/2023 Medical Rec #:  [id number]    Height:       71.0 in Accession #:    [id number]   Weight:       176.4 lb Date of Birth:  May 17, 1945    BSA:          1.999 m Patient Age:    77 years     BP:           137/100 mmHg Patient Gender: M            HR:           128 bpm. Exam Location:  Inpatient Procedure: 2D Echo, Cardiac Doppler, Color Doppler and Intracardiac            Opacification Agent (Both Spectral and Color Flow Doppler were            utilized during procedure). Indications:    Acute Respiratory Distress   History:        Patient has no prior history of Echocardiogram examinations.                 Arrythmias:Atrial Flutter; Risk Factors:Hypertension.  Sonographer:    Willey Harrier Referring Phys: Madelynn Schilder, M IMPRESSIONS  1. Left ventricular ejection fraction, by estimation, is 25 to 30%. The left ventricle has severely  decreased function. The left ventricle demonstrates global hypokinesis. There is mild concentric left ventricular hypertrophy. Left ventricular diastolic  parameters are indeterminate.  2. Right ventricular systolic function is mildly reduced. The right ventricular size is normal.  3. The mitral valve is normal in structure. No evidence of mitral valve regurgitation. No evidence of mitral stenosis.  4. The aortic valve was not well visualized. Aortic valve regurgitation is not visualized. No aortic stenosis is present.  5. The inferior vena cava is normal in size with <50% respiratory variability, suggesting right atrial pressure of 8 mmHg. Comparison(s): No prior Echocardiogram. FINDINGS  Left Ventricle: Left ventricular ejection fraction, by estimation, is 25 to 30%. The left ventricle has severely decreased function. The left ventricle demonstrates global hypokinesis. Definity  contrast agent was given IV to delineate the left ventricular endocardial borders. Strain was performed and the global longitudinal strain is indeterminate. The left ventricular internal cavity size was normal in size. There is mild concentric left ventricular hypertrophy. Left ventricular diastolic parameters are indeterminate. Right Ventricle: The right ventricular size is normal. No increase in right ventricular wall thickness. Right ventricular systolic function is mildly reduced. Left Atrium: Left atrial size was normal in size. Right Atrium: Right atrial size was normal in size. Pericardium: There is no evidence of pericardial effusion. Mitral Valve: The mitral valve is normal in structure. No evidence of mitral  valve regurgitation. No evidence of mitral valve stenosis. MV peak gradient, 3.7 mmHg. The mean mitral valve gradient is 2.0 mmHg. Tricuspid Valve: The tricuspid valve is normal in structure. Tricuspid valve regurgitation is not demonstrated. No evidence of tricuspid stenosis. Aortic Valve: The aortic valve was not well visualized. Aortic valve regurgitation is not visualized. No aortic stenosis is present. Aortic valve peak gradient measures 4.6 mmHg. Pulmonic Valve: The pulmonic valve was normal in structure. Pulmonic valve regurgitation is not visualized. No evidence of pulmonic stenosis. Aorta: The aortic root and ascending aorta are structurally normal, with no evidence of dilitation. Venous: The inferior vena cava is normal in size with less than 50% respiratory variability, suggesting right atrial pressure of 8 mmHg. IAS/Shunts: There is right bowing of the interatrial septum, suggestive of elevated left atrial pressure. The atrial septum is grossly normal. Additional Comments: 3D was performed not requiring image post processing on an independent workstation and was indeterminate.  LEFT VENTRICLE PLAX 2D LVIDd:         4.10 cm LVIDs:         3.60 cm LV PW:         1.20 cm LV IVS:        1.20 cm LVOT diam:     2.20 cm LV SV:         36 LV SV Index:   18 LVOT Area:     3.80 cm  RIGHT VENTRICLE          IVC RV Basal diam:  3.70 cm  IVC diam: 1.70 cm LEFT ATRIUM             Index        RIGHT ATRIUM           Index LA Vol (A2C):   35.9 ml 17.96 ml/m  RA Area:     14.40 cm LA Vol (A4C):   24.7 ml 12.36 ml/m  RA Volume:   32.40 ml  16.21 ml/m LA Biplane Vol: 32.4 ml 16.21 ml/m  AORTIC VALVE AV Area (Vmax): 2.78 cm AV Vmax:  107.50 cm/s AV Peak Grad:   4.6 mmHg LVOT Vmax:      78.73 cm/s LVOT Vmean:     49.933 cm/s LVOT VTI:       0.096 m  AORTA Ao Root diam: 3.30 cm Ao Asc diam:  3.20 cm MITRAL VALVE MV Area (PHT): 3.84 cm    SHUNTS MV Area VTI:   3.40 cm    Systemic VTI:  0.10 m MV Peak grad:  3.7  mmHg    Systemic Diam: 2.20 cm MV Mean grad:  2.0 mmHg MV Vmax:       0.96 m/s MV Vmean:      67.3 cm/s MV Decel Time: 198 msec MV E velocity: 92.03 cm/s Gloriann Larger MD Electronically signed by Gloriann Larger MD Signature Date/Time: 09/19/2023/6:21:12 PM    Final    DG Abd Portable 1V Result Date: 09/18/2023 CLINICAL DATA:  OG tube placement EXAM: PORTABLE ABDOMEN - 1 VIEW COMPARISON:  05/09/2023 FINDINGS: Enteric tube tip is looped within the proximal stomach, tip position beneath the left hemidiaphragm in the gastric cardia region. Nonobstructed gas pattern. Right femoral catheter with tip overlying the right aspect of L5. Excreted contrast in the renal collecting systems. IMPRESSION: Enteric tube tip looped within the proximal stomach, tip in the gastric cardia region. Electronically Signed   By: Esmeralda Hedge M.D.   On: 09/18/2023 19:16   EEG adult Result Date: 09/18/2023 Arleene Lack, MD     09/18/2023  6:25 PM Patient Name: Autry Prust MRN: [id number] Epilepsy Attending: Arleene Lack Referring Physician/Provider: Pinkey Brier, NP Date: 09/18/2023 Duration: 23.56 mins Patient history: 79yo M with h/o seizures came with fall. EEG to evaluate for seizure Level of alertness: comatose AEDs during EEG study: propofol  Technical aspects: This EEG study was done with scalp electrodes positioned according to the 10-20 International system of electrode placement. Electrical activity was reviewed with band pass filter of 1-70Hz , sensitivity of 7 uV/mm, display speed of 51mm/sec with a 60Hz  notched filter applied as appropriate. EEG data were recorded continuously and digitally stored.  Video monitoring was available and reviewed as appropriate. Description: EEG showed continuous generalized 2-3 Hz delta slowing. Hyperventilation and photic stimulation were not performed.   ABNORMALITY - Continuous slow, generalized IMPRESSION: This study is suggestive of severe diffuse encephalopathy. No  seizures or epileptiform discharges were seen throughout the recording. Arleene Lack   DG Chest Portable 1 View Result Date: 09/18/2023 CLINICAL DATA:  Post intubation EXAM: PORTABLE CHEST 1 VIEW COMPARISON:  Sep 18, 2023 FINDINGS: The heart size and mediastinal contours are within normal limits. Both lungs are clear. The visualized skeletal structures are unremarkable. Comparison with prior examination there is no change in the position of the endotracheal tube the tip of which remains 2 cm from carina. The nasogastric tube remains in anatomic position the tip in the stomach. IMPRESSION: *No acute cardiopulmonary process. *Endotracheal tube and nasogastric tube in anatomic position. Electronically Signed   By: Fredrich Jefferson M.D.   On: 09/18/2023 11:47   CT CHEST ABDOMEN PELVIS W CONTRAST Addendum Date: 09/18/2023 ADDENDUM REPORT: 09/18/2023 11:44 ADDENDUM: Critical Value/emergent results were called by telephone at the time of interpretation on 09/18/2023 at 11:30 am to Dr. Aniceto Barley, who verbally acknowledged these results. Electronically Signed   By: Beula Brunswick M.D.   On: 09/18/2023 11:44   Result Date: 09/18/2023 CLINICAL DATA:  Polytrauma, blunt. Unwitnessed fall. History of sigmoid colon cancer. * Tracking Code: BO * EXAM: CT CHEST,  ABDOMEN, AND PELVIS WITH CONTRAST TECHNIQUE: Multidetector CT imaging of the chest, abdomen and pelvis was performed following the standard protocol during bolus administration of intravenous contrast. RADIATION DOSE REDUCTION: This exam was performed according to the departmental dose-optimization program which includes automated exposure control, adjustment of the mA and/or kV according to patient size and/or use of iterative reconstruction technique. CONTRAST:  75mL OMNIPAQUE  IOHEXOL  350 MG/ML SOLN COMPARISON:  PET-CT scan from 04/19/2023. FINDINGS: CT CHEST FINDINGS Cardiovascular: Normal cardiac size. No pericardial effusion. No aortic aneurysm. Mediastinum/Nodes:  Visualized thyroid gland appears grossly unremarkable. No solid / cystic mediastinal masses. The esophagus is nondistended precluding optimal assessment. An enteric tube noted with its tip in the cardia/fundus of the stomach. No axillary, mediastinal or hilar lymphadenopathy by size criteria. Lungs/Pleura: The central tracheo-bronchial tree is patent. There are dependent changes in bilateral lungs. No mass or consolidation. No pleural effusion or pneumothorax. No suspicious lung nodules. Musculoskeletal: The visualized soft tissues of the chest wall are grossly unremarkable. No suspicious osseous lesions. There are mild to moderate multilevel degenerative changes in the visualized spine. CT ABDOMEN PELVIS FINDINGS Hepatobiliary: The liver is normal in size. Non-cirrhotic configuration. No suspicious mass. These is mild diffuse hepatic steatosis. No intrahepatic or extrahepatic bile duct dilation. No calcified gallstones. Normal gallbladder wall thickness. No pericholecystic inflammatory changes. Pancreas: Unremarkable. No pancreatic ductal dilatation or surrounding inflammatory changes. Spleen: Within normal limits. No focal lesion. Adrenals/Urinary Tract: Adrenal glands are unremarkable. Persistent fetal lobulations noted in bilateral kidneys. No suspicious mass. No hydroureteronephrosis or nephroureterolithiasis. Unremarkable urinary bladder. Stomach/Bowel: No disproportionate dilation of the small or large bowel loops. The appendix was not visualized; however there is no acute inflammatory process in the right lower quadrant. 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 Vascular/Lymphatic: No ascites or pneumoperitoneum. No abdominal or pelvic lymphadenopathy, by size criteria. No aneurysmal dilation of the major  abdominal arteries. There are mild peripheral atherosclerotic vascular calcifications of the aorta and its major branches. Reproductive: Normal size prostate. Symmetric seminal vesicles. Other: There are small fat containing umbilical and left inguinal hernias. There are hypoattenuating areas in the linear configuration in the left iliacus/iliopsoas muscles, nonspecific but can be seen with bursitis. The fluid collection does not exhibit hyperattenuating walls to suggest abscess. Correlate clinically to determine the need for additional imaging. The soft tissues and abdominal wall are otherwise unremarkable. Musculoskeletal: No suspicious osseous lesions. There are mild - moderate multilevel degenerative changes in the visualized spine. IMPRESSION: 1. No acute traumatic injury to the chest, abdomen or pelvis. 2. 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 3. Hypoattenuating areas in the left iliacus/iliopsoas muscle, as described above. 4. Multiple other nonacute observations, as described above. Electronically Signed: By: Beula Brunswick M.D. On: 09/18/2023 11:15   CT HEAD WO CONTRAST Result Date: 09/18/2023 CLINICAL DATA:  Provided history: Head trauma, moderate/severe. Polytrauma, blunt. EXAM: CT HEAD WITHOUT CONTRAST CT CERVICAL SPINE WITHOUT CONTRAST TECHNIQUE: Multidetector CT imaging of the head and cervical spine was performed following the standard protocol without intravenous contrast. Multiplanar CT image reconstructions of the cervical spine were also generated. RADIATION DOSE REDUCTION: This exam was performed according to the departmental dose-optimization program which includes automated exposure control, adjustment of the mA and/or kV according to patient size and/or use  of iterative reconstruction technique. COMPARISON:  Brain MRI 05/09/2023. Head CT 05/09/2023. Cervical spine  CT 05/09/2023. FINDINGS: CT HEAD FINDINGS Brain: Generalized cerebral atrophy. Small chronic cortical/subcortical infarcts again noted within the right frontal lobe. Background mild patchy and ill-defined hypoattenuation within the cerebral white matter, nonspecific but compatible with chronic small vessel ischemic diseaase. Small chronic infarcts within the thalami. There is no acute intracranial hemorrhage. No acute demarcated cortical infarct. No extra-axial fluid collection. No evidence of an intracranial mass. No midline shift. Vascular: No hyperdense vessel.  Atherosclerotic calcifications. Skull: No calvarial fracture or aggressive osseous lesion. Sinuses/Orbits: No mass or acute finding within the imaged orbits. No significant paranasal sinus disease. Other: Left frontoparietal and temporal scalp hematoma. CT CERVICAL SPINE FINDINGS Alignment: Nonspecific straightening of the expected cervical lordosis. Slight C7-T1 grade 1 anterolisthesis. Skull base and vertebrae: The basion-dental and atlanto-dental intervals are maintained.No evidence of acute fracture to the cervical spine. Soft tissues and spinal canal: No prevertebral fluid or swelling. No visible canal hematoma. Disc levels: Cervical spondylosis with multilevel disc space narrowing, disc bulges/central disc protrusions, endplate osteophytes, uncovertebral hypertrophy and facet arthropathy. Bridging ventral osteophytes at C2-C3, C3-C4, C4-C5, C5-C6 and C7-T1. Osseous fusion present elsewhere across the disc spaces at C3-C4, C4-C5 and C5-C6. No appreciable high-grade spinal canal stenosis. Mild bony neural foraminal narrowing on the right at C6-C7 due to uncovertebral hypertrophy. Upper chest: No consolidation within the imaged lung apices. No visible pneumothorax. Other: Partially imaged support tubes. No evidence of an acute intracranial abnormality. No acute cervical spine fracture. These results were called by telephone at the time of  interpretation on 09/18/2023 at 11:22 am to provider PA Cherlyn Cornet, who verbally acknowledged these results. IMPRESSION: 1. No evidence of an acute intracranial abnormality. 2. Left-sided scalp hematoma. 3. 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 CT cervical spine: 1. No evidence of an acute cervical spine fracture. 2. Nonspecific straightening of the expected cervical lordosis. 3. Mild C7-T1 grade 1 anterolisthesis. 4. Cervical spondylosis and multilevel vertebral ankylosis, as described. Electronically Signed   By: Bascom Lily D.O.   On: 09/18/2023 11:23   CT CERVICAL SPINE WO CONTRAST Result Date: 09/18/2023 CLINICAL DATA:  Provided history: Head trauma, moderate/severe. Polytrauma, blunt. EXAM: CT HEAD WITHOUT CONTRAST CT CERVICAL SPINE WITHOUT CONTRAST TECHNIQUE: Multidetector CT imaging of the head and cervical spine was performed following the standard protocol without intravenous contrast. Multiplanar CT image reconstructions of the cervical spine were also generated. RADIATION DOSE REDUCTION: This exam was performed according to the departmental dose-optimization program which includes automated exposure control, adjustment of the mA and/or kV according to patient size and/or use of iterative reconstruction technique. COMPARISON:  Brain MRI 05/09/2023. Head CT 05/09/2023. Cervical spine CT 05/09/2023. FINDINGS: CT HEAD FINDINGS Brain: Generalized cerebral atrophy. Small chronic cortical/subcortical infarcts again noted within the right frontal lobe. Background mild patchy and ill-defined hypoattenuation within the cerebral white matter, nonspecific but compatible with chronic small vessel ischemic diseaase. Small chronic infarcts within the thalami. There is no acute intracranial hemorrhage. No acute demarcated cortical infarct. No extra-axial fluid collection. No evidence of an intracranial mass. No midline shift. Vascular: No hyperdense vessel.   Atherosclerotic calcifications. Skull: No calvarial fracture or aggressive osseous lesion. Sinuses/Orbits: No mass or acute finding within the imaged orbits. No significant paranasal sinus disease. Other: Left frontoparietal and temporal scalp hematoma. CT CERVICAL SPINE FINDINGS Alignment: Nonspecific straightening of the expected cervical lordosis. Slight C7-T1 grade 1 anterolisthesis. Skull base and  vertebrae: The basion-dental and atlanto-dental intervals are maintained.No evidence of acute fracture to the cervical spine. Soft tissues and spinal canal: No prevertebral fluid or swelling. No visible canal hematoma. Disc levels: Cervical spondylosis with multilevel disc space narrowing, disc bulges/central disc protrusions, endplate osteophytes, uncovertebral hypertrophy and facet arthropathy. Bridging ventral osteophytes at C2-C3, C3-C4, C4-C5, C5-C6 and C7-T1. Osseous fusion present elsewhere across the disc spaces at C3-C4, C4-C5 and C5-C6. No appreciable high-grade spinal canal stenosis. Mild bony neural foraminal narrowing on the right at C6-C7 due to uncovertebral hypertrophy. Upper chest: No consolidation within the imaged lung apices. No visible pneumothorax. Other: Partially imaged support tubes. No evidence of an acute intracranial abnormality. No acute cervical spine fracture. These results were called by telephone at the time of interpretation on 09/18/2023 at 11:22 am to provider PA Cherlyn Cornet, who verbally acknowledged these results. IMPRESSION: 1. No evidence of an acute intracranial abnormality. 2. Left-sided scalp hematoma. 3. 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 CT cervical spine: 1. No evidence of an acute cervical spine fracture. 2. Nonspecific straightening of the expected cervical lordosis. 3. Mild C7-T1 grade 1 anterolisthesis. 4. Cervical spondylosis and multilevel vertebral ankylosis, as described. Electronically Signed   By: Bascom Lily D.O.   On: 09/18/2023 11:23   DG Pelvis Portable Result Date: 09/18/2023 CLINICAL DATA:  Trauma. EXAM: PORTABLE PELVIS 1-2 VIEWS COMPARISON:  None Available. FINDINGS: Pelvis is intact with normal and symmetric sacroiliac joints. No acute fracture or dislocation. No aggressive osseous lesion. Visualized sacral arcuate lines are unremarkable. Unremarkable symphysis pubis. There are mild degenerative changes of bilateral hip joints without significant joint space narrowing. Osteophytosis of the superior acetabulum. No radiopaque foreign bodies. IMPRESSION: *No acute osseous abnormality of the pelvis. Electronically Signed   By: Beula Brunswick M.D.   On: 09/18/2023 11:17   DG Chest Port 1 View Result Date: 09/18/2023 CLINICAL DATA:  Trauma. EXAM: PORTABLE CHEST 1 VIEW COMPARISON:  05/11/2023. FINDINGS: Low lung volume. Bilateral lung fields are clear. Bilateral costophrenic angles are clear. Normal cardio-mediastinal silhouette. No acute osseous abnormalities. The soft tissues are within normal limits. Endotracheal tube is seen with its tip approximately 2-2.5 cm above the carina. Enteric tube is seen coursing below the left hemidiaphragm, curling within the proximal stomach with its tip and side hole overlying the fundal region of the stomach. IMPRESSION: No active disease. Support apparatus, as described above. Electronically Signed   By: Beula Brunswick M.D.   On: 09/18/2023 11:00     Subjective: No complaints  Discharge Exam: Vitals:   09/29/23 0337 09/29/23 0742  BP: 132/71 129/77  Pulse: 82 67  Resp: 18 18  Temp: 98.2 F (36.8 C) 98.8 F (37.1 C)  SpO2: 100% 100%   Vitals:   09/28/23 2331 09/29/23 0018 09/29/23 0337 09/29/23 0742  BP: 137/75 121/83 132/71 129/77  Pulse: 82 72 82 67  Resp: 18 18 18 18   Temp: 98.5 F (36.9 C) 98.1 F (36.7 C) 98.2 F (36.8 C) 98.8 F (37.1 C)  TempSrc: Oral Oral Axillary Oral  SpO2: 100% 100% 100% 100%  Weight:      Height:         General: Pt is alert, awake, not in acute distress Cardiovascular: RRR, S1/S2 +, no rubs, no gallops Respiratory: CTA bilaterally, no wheezing, no rhonchi Abdominal: Soft, NT, ND, bowel sounds + Extremities: no edema, no cyanosis    The results of significant diagnostics from this hospitalization (including imaging,  microbiology, ancillary and laboratory) are listed below for reference.     Microbiology: Recent Results (from the past 240 hours)  Culture, blood (Routine X 2) w Reflex to ID Panel     Status: None   Collection Time: 09/20/23  9:25 AM   Specimen: BLOOD LEFT HAND  Result Value Ref Range Status   Specimen Description BLOOD LEFT HAND  Final   Special Requests   Final    BOTTLES DRAWN AEROBIC AND ANAEROBIC Blood Culture results may not be optimal due to an inadequate volume of blood received in culture bottles   Culture   Final    NO GROWTH 5 DAYS Performed at [name], [address] [place], [place] [address]    Report Status 09/25/2023 FINAL  Final  Culture, blood (Routine X 2) w Reflex to ID Panel     Status: None   Collection Time: 09/20/23  9:30 AM   Specimen: BLOOD LEFT ARM  Result Value Ref Range Status   Specimen Description BLOOD LEFT ARM  Final   Special Requests   Final    BOTTLES DRAWN AEROBIC AND ANAEROBIC Blood Culture results may not be optimal due to an inadequate volume of blood received in culture bottles   Culture   Final    NO GROWTH 5 DAYS Performed at [facility], [address], [place], [place] [address]    Report Status 09/25/2023 FINAL  Final     Labs: BNP (last 3 results) No results for input(s): "BNP" in the last 8760 hours. Basic Metabolic Panel: Recent Labs  Lab 09/24/23 0925 09/25/23 0655 09/27/23 0524 09/28/23 1159 09/29/23 0655  NA 139 141 137 137 138  K 3.0* 3.9 3.5 3.8 3.8  CL 109 112* 109 110 109  CO2 21* 23 22 20* 15*  GLUCOSE 134* 128* 76 179* 70  BUN 18 21 24* 20 21  CREATININE 1.80* 1.97*  1.89* 1.69* 1.81*  CALCIUM  7.8* 8.0* 7.8* 8.1* 8.0*  MG 2.0  --  2.0  --   --    Liver Function Tests: No results for input(s): "AST", "ALT", "ALKPHOS", "BILITOT", "PROT", "ALBUMIN" in the last 168 hours. No results for input(s): "LIPASE", "AMYLASE" in the last 168 hours. No results for input(s): "AMMONIA" in the last 168 hours. CBC: Recent Labs  Lab 09/23/23 0255 09/25/23 0655 09/27/23 0524  WBC 6.1 6.1 4.6  NEUTROABS  --  3.6  --   HGB 9.8* 10.6* 10.0*  HCT 29.8* 32.0* 29.5*  MCV 78.4* 77.5* 76.2*  PLT 181 239 293   Cardiac Enzymes: Recent Labs  Lab 09/26/23 0615  CKTOTAL 150   BNP: Invalid input(s): "POCBNP" CBG: Recent Labs  Lab 09/28/23 0606 09/28/23 1136 09/28/23 1644 09/28/23 2105 09/29/23 0911  GLUCAP 105* 176* 128* 145* 79   D-Dimer No results for input(s): "DDIMER" in the last 72 hours. Hgb A1c No results for input(s): "HGBA1C" in the last 72 hours. Lipid Profile No results for input(s): "CHOL", "HDL", "LDLCALC", "TRIG", "CHOLHDL", "LDLDIRECT" in the last 72 hours. Thyroid function studies No results for input(s): "TSH", "T4TOTAL", "T3FREE", "THYROIDAB" in the last 72 hours.  Invalid input(s): "FREET3" Anemia work up No results for input(s): "VITAMINB12", "FOLATE", "FERRITIN", "TIBC", "IRON ", "RETICCTPCT" in the last 72 hours. Urinalysis    Component Value Date/Time   COLORURINE YELLOW 09/18/2023 1123   APPEARANCEUR CLOUDY (A) 09/18/2023 1123   LABSPEC 1.021 09/18/2023 1123   PHURINE 5.0 09/18/2023 1123   GLUCOSEU >=500 (A) 09/18/2023  1123   HGBUR MODERATE (A) 09/18/2023 1123   BILIRUBINUR NEGATIVE 09/18/2023 1123   KETONESUR 5 (A) 09/18/2023 1123   PROTEINUR >=300 (A) 09/18/2023 1123   NITRITE NEGATIVE 09/18/2023 1123   LEUKOCYTESUR NEGATIVE 09/18/2023 1123   Sepsis Labs Recent Labs  Lab 09/23/23 0255 09/25/23 0655 09/27/23 0524  WBC 6.1 6.1 4.6   Microbiology Recent Results (from the past 240 hours)  Culture, blood (Routine X 2)  w Reflex to ID Panel     Status: None   Collection Time: 09/20/23  9:25 AM   Specimen: BLOOD LEFT HAND  Result Value Ref Range Status   Specimen Description BLOOD LEFT HAND  Final   Special Requests   Final    BOTTLES DRAWN AEROBIC AND ANAEROBIC Blood Culture results may not be optimal due to an inadequate volume of blood received in culture bottles   Culture   Final    NO GROWTH 5 DAYS Performed at [facility], [address], [place], [place] [address]    Report Status 09/25/2023 FINAL  Final  Culture, blood (Routine X 2) w Reflex to ID Panel     Status: None   Collection Time: 09/20/23  9:30 AM   Specimen: BLOOD LEFT ARM  Result Value Ref Range Status   Specimen Description BLOOD LEFT ARM  Final   Special Requests   Final    BOTTLES DRAWN AEROBIC AND ANAEROBIC Blood Culture results may not be optimal due to an inadequate volume of blood received in culture bottles   Culture   Final    NO GROWTH 5 DAYS Performed at [facility], [address] [place], [place] [address]    Report Status 09/25/2023 FINAL  Final     Time coordinating discharge: Over 35 minutes  SIGNED:   Macdonald Savoy, MD  [facility] 09/29/2023, 10:42 AM Pager   If 7PM-7AM, please contact night-coverage [email or web address] Password [email or web address]

## 2023-09-29 NOTE — Progress Notes (Signed)
 History of atrial flutter Sinus bradycardia with pauses Patient's RN reported that patient has almost 4 seconds of sinus pauses and heart rate goes down to low as 40s during sleep. Recommending to hold the Coreg . -Please reach out to on-call cardiology after 7 AM to discuss if it is safe to continue Coreg  or need to treat with alternative.  Lucella Pommier, MD [facility] 09/29/2023, 5:10 AM

## 2023-09-29 NOTE — Progress Notes (Signed)
 [facility] PROGRESS NOTE    Progress Note  Cory [name]  QMV:[id number] DOB: 07-08-1945 DOA: 09/18/2023 PCP: Janese Medicine., MD     Brief Narrative:   Cory [name] is an 78 y.o. 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  Found to have a hematoma of the left frontal forehead, GCS of 5 on arrival PCCM consulted intubated for airway protection CT of the head chest abdomen and pelvis negative for trauma, was noted to have blood glucose of 400 serum creatinine of 2.5 T. bili 1.3 CK of 600 lactic acid of 2.2.  EEG negative on admission extubated on 09/20/2022 transferred to [facility] on 09/22/2022.  Awaiting insurance authorization.   Assessment/Plan:   Acute metabolic encephalopathy in the setting of DKA: UDS is negative CT of the head was negative. EEG showed no evidence of seizures. He appears to be back to baseline physical therapy evaluated the patient awaiting skilled nursing facility placement. Awaiting skilled nursing facility placement.  Mainly awaiting for insurance authorization.  Unwitnessed fall with left scalp hematoma: Pain is controlled.  Acute respiratory failure with hypoxia likely due to sepsis probable aspiration: He has completed an 8-day course of IV meropenem  will discontinue today.  Acute kidney injury on chronic kidney disease stage IIIb: Creatinine returning to baseline with conservative management. Unknown baseline creatinine.  Hypokalemia: Repleted now improved.  Acute systolic heart failure: With a last EF show on 25 to 30%, question stress cardiomyopathy. Was started on Coreg  Coreg  and BiDil . Creatinine has remained stable. Blood pressure is relatively  well-controlled on current treatment we will continue current management.  Atrial flutter: On Eliquis  and Coreg  now in sinus rhythm.  Hyperlipidemia: Continue statins.  Normocytic anemia/iron  deficiency anemia: Continue iron  supplementation. Will need cancer screening as an outpatient.  Essential hypertension: Continue ARB and Coreg  blood pressure is well-controlled. Had an episode of bradycardia is down during sleep remains asymptomatic.  Diabetes mellitus type 2 with hyperglycemia: With an A1c of 12.   Well-controlled continue long-acting insulin  plus sliding scale.  Sacral decubitus ulcer stage III present on admission: RN Pressure Injury Documentation: Pressure Injury 09/18/23 Sacrum Medial Stage 3 -  Full thickness tissue loss. Subcutaneous fat may be visible but bone, tendon or muscle are NOT exposed. Stg 3 (Active)  09/18/23 1439  Location: Sacrum  Location Orientation: Medial  Staging: Stage 3 -  Full thickness tissue loss. Subcutaneous fat may be visible but bone, tendon or muscle are NOT exposed.  Wound Description (Comments): Stg 3  Present on Admission: Yes  Dressing Type Foam - Lift dressing to assess site every shift 09/28/23 2015     DVT prophylaxis: lovenox  Family Communication:none Status is: Inpatient Remains inpatient appropriate because: Awaiting skilled nursing facility placement.    Code Status:     Code Status Orders  (From admission, onward)           Start     Ordered   09/18/23 1202  Full code  Continuous       Question:  By:  Answer:  Consent: discussion documented in EHR   09/18/23 1203           Code Status History     This patient has a current code  status but no historical code status.         IV Access:   Peripheral IV   Procedures and diagnostic studies:   No results found.   Medical Consultants:   None.   Subjective:    [facility] no complaints.  Objective:    Vitals:   09/28/23 2331  09/29/23 0018 09/29/23 0337 09/29/23 0742  BP: 137/75 121/83 132/71 129/77  Pulse: 82 72 82 67  Resp: 18 18 18 18   Temp: 98.5 F (36.9 C) 98.1 F (36.7 C) 98.2 F (36.8 C) 98.8 F (37.1 C)  TempSrc: Oral Oral Axillary Oral  SpO2: 100% 100% 100% 100%  Weight:      Height:       SpO2: 100 % O2 Flow Rate (L/min): 1 L/min FiO2 (%): 40 %   Intake/Output Summary (Last 24 hours) at 09/29/2023 0952 Last data filed at 09/28/2023 1852 Gross per 24 hour  Intake 175 ml  Output 1100 ml  Net -925 ml   Filed Weights   09/24/23 0752 09/27/23 0500 09/28/23 0500  Weight: 80.5 kg 84.6 kg 89.6 kg    Exam: General exam: In no acute distress. Respiratory system: Good air movement and clear to auscultation. Cardiovascular system: S1 & S2 heard, RRR. No JVD. Gastrointestinal system: Abdomen is nondistended, soft and nontender.  Extremities: No pedal edema. Skin: No rashes, lesions or ulcers Psychiatry: Judgement and insight appear normal. Mood & affect appropriate.  Data Reviewed:    Labs: Basic Metabolic Panel: Recent Labs  Lab 09/24/23 0925 09/25/23 0655 09/27/23 0524 09/28/23 1159 09/29/23 0655  NA 139 141 137 137 138  K 3.0* 3.9 3.5 3.8 3.8  CL 109 112* 109 110 109  CO2 21* 23 22 20* 15*  GLUCOSE 134* 128* 76 179* 70  BUN 18 21 24* 20 21  CREATININE 1.80* 1.97* 1.89* 1.69* 1.81*  CALCIUM  7.8* 8.0* 7.8* 8.1* 8.0*  MG 2.0  --  2.0  --   --    GFR Estimated Creatinine Clearance: 37 mL/min (A) (by C-G formula based on SCr of 1.81 mg/dL (H)). Liver Function Tests: No results for input(s): "AST", "ALT", "ALKPHOS", "BILITOT", "PROT", "ALBUMIN" in the last 168 hours. No results for input(s): "LIPASE", "AMYLASE" in the last 168 hours. No results for input(s): "AMMONIA" in the last 168 hours. Coagulation profile No results for input(s): "INR", "PROTIME" in the last 168 hours. COVID-19 Labs  No results for input(s): "DDIMER", "FERRITIN", "LDH", "CRP" in the last 72 hours.  No  results found for: "SARSCOV2NAA"  CBC: Recent Labs  Lab 09/23/23 0255 09/25/23 0655 09/27/23 0524  WBC 6.1 6.1 4.6  NEUTROABS  --  3.6  --   HGB 9.8* 10.6* 10.0*  HCT 29.8* 32.0* 29.5*  MCV 78.4* 77.5* 76.2*  PLT 181 239 293   Cardiac Enzymes: Recent Labs  Lab 09/26/23 0615  CKTOTAL 150   BNP (last 3 results) No results for input(s): "PROBNP" in the last 8760 hours. CBG: Recent Labs  Lab 09/28/23 0606 09/28/23 1136 09/28/23 1644 09/28/23 2105 09/29/23 0911  GLUCAP 105* 176* 128* 145* 79   D-Dimer: No results for input(s): "DDIMER" in the last 72 hours. Hgb A1c: No results for input(s): "HGBA1C" in the last 72 hours. Lipid Profile: No results for input(s): "CHOL", "HDL", "LDLCALC", "TRIG", "CHOLHDL", "LDLDIRECT" in the last 72 hours. Thyroid function studies: No results for input(s): "TSH", "T4TOTAL", "T3FREE", "THYROIDAB" in the last 72 hours.  Invalid input(s): "FREET3" Anemia work up: No results  for input(s): "VITAMINB12", "FOLATE", "FERRITIN", "TIBC", "IRON ", "RETICCTPCT" in the last 72 hours. Sepsis Labs: Recent Labs  Lab 09/23/23 0255 09/25/23 0655 09/27/23 0524  WBC 6.1 6.1 4.6   Microbiology Recent Results (from the past 240 hours)  Culture, Respiratory w Gram Stain     Status: None   Collection Time: 09/19/23  9:53 AM   Specimen: Tracheal Aspirate; Respiratory  Result Value Ref Range Status   Specimen Description TRACHEAL ASPIRATE  Final   Special Requests NONE  Final   Gram Stain   Final    ABUNDANT WBC PRESENT, PREDOMINANTLY PMN FEW GRAM NEGATIVE RODS RARE GRAM POSITIVE COCCI Performed at [facility], [address] [place], [place] [address]    Culture   Final    ABUNDANT SERRATIA MARCESCENS MODERATE ESCHERICHIA COLI Confirmed Extended Spectrum Beta-Lactamase Producer (ESBL).  In bloodstream infections from ESBL organisms, carbapenems are preferred over piperacillin /tazobactam. They are shown to have a lower risk of mortality.     Report Status 09/22/2023 FINAL  Final   Organism ID, Bacteria SERRATIA MARCESCENS  Final   Organism ID, Bacteria ESCHERICHIA COLI  Final      Susceptibility   Escherichia coli - MIC*    AMPICILLIN  >=32 RESISTANT Resistant     CEFEPIME  >=32 RESISTANT Resistant     CEFTAZIDIME RESISTANT Resistant     CEFTRIAXONE  >=64 RESISTANT Resistant     CIPROFLOXACIN >=4 RESISTANT Resistant     GENTAMICIN <=1 SENSITIVE Sensitive     IMIPENEM <=0.25 SENSITIVE Sensitive     TRIMETH /SULFA  >=320 RESISTANT Resistant     AMPICILLIN /SULBACTAM >=32 RESISTANT Resistant     PIP/TAZO <=4 SENSITIVE Sensitive ug/mL    * MODERATE ESCHERICHIA COLI   Serratia marcescens - MIC*    CEFEPIME  <=0.12 SENSITIVE Sensitive     CEFTAZIDIME <=1 SENSITIVE Sensitive     CEFTRIAXONE  <=0.25 SENSITIVE Sensitive     CIPROFLOXACIN <=0.25 SENSITIVE Sensitive     GENTAMICIN <=1 SENSITIVE Sensitive     TRIMETH /SULFA  <=20 SENSITIVE Sensitive     * ABUNDANT SERRATIA MARCESCENS  Culture, blood (Routine X 2) w Reflex to ID Panel     Status: None   Collection Time: 09/20/23  9:25 AM   Specimen: BLOOD LEFT HAND  Result Value Ref Range Status   Specimen Description BLOOD LEFT HAND  Final   Special Requests   Final    BOTTLES DRAWN AEROBIC AND ANAEROBIC Blood Culture results may not be optimal due to an inadequate volume of blood received in culture bottles   Culture   Final    NO GROWTH 5 DAYS Performed at [facility], [address], [place], [place] [address]    Report Status 09/25/2023 FINAL  Final  Culture, blood (Routine X 2) w Reflex to ID Panel     Status: None   Collection Time: 09/20/23  9:30 AM   Specimen: BLOOD LEFT ARM  Result Value Ref Range Status   Specimen Description BLOOD LEFT ARM  Final   Special Requests   Final    BOTTLES DRAWN AEROBIC AND ANAEROBIC Blood Culture results may not be optimal due to an inadequate volume of blood received in culture bottles   Culture   Final    NO GROWTH 5  DAYS Performed at [facility] Lab, [address], [place], [place] [address]    Report Status 09/25/2023 FINAL  Final     Medications:    amLODipine   5 mg Oral Daily   apixaban   5 mg Oral BID   atorvastatin   20 mg Oral Daily   feeding supplement  237 mL Oral BID BM   ferrous sulfate   325 mg Oral BID WC   guaiFENesin   600 mg Oral BID   insulin  aspart  0-5 Units Subcutaneous QHS   insulin  aspart  0-6 Units Subcutaneous TID WC   insulin  aspart  3 Units Subcutaneous TID WC   insulin  glargine-yfgn  20 Units Subcutaneous Daily   isosorbide -hydrALAZINE   2 tablet Oral TID   leptospermum manuka honey  1 Application Topical Daily   pantoprazole   40 mg Oral Daily   sertraline  50 mg Oral Daily   tamsulosin   0.4 mg Oral QPC supper   Continuous Infusions:      LOS: 11 days   Macdonald Savoy  [facility]  09/29/2023, 9:52 AM

## 2023-10-09 ENCOUNTER — Ambulatory Visit (INDEPENDENT_AMBULATORY_CARE_PROVIDER_SITE_OTHER)

## 2023-10-09 VITALS — BP 150/80 | HR 64 | Temp 98.0°F | Resp 20 | Ht 65.0 in | Wt 195.0 lb

## 2023-10-09 DIAGNOSIS — D509 Iron deficiency anemia, unspecified: Secondary | ICD-10-CM

## 2023-10-09 DIAGNOSIS — I501 Left ventricular failure: Secondary | ICD-10-CM

## 2023-10-09 MED ORDER — IRON SUCROSE 20 MG/ML IV SOLN
200.0000 mg | Freq: Once | INTRAVENOUS | Status: AC
  Administered 2023-10-09: 200 mg via INTRAVENOUS
  Filled 2023-10-09: qty 10

## 2023-10-09 NOTE — Progress Notes (Signed)
 Diagnosis: Acute Anemia  Provider:  Chilton Greathouse MD  Procedure: IV Push  IV Type: Peripheral, IV Location: L Antecubital  Venofer (Iron Sucrose), Dose: 200 mg  Post Infusion IV Care: Observation period completed and Peripheral IV Discontinued  Discharge: Condition: Good, Destination: Home . AVS Provided  Performed by:  Nat Math, RN

## 2023-10-11 ENCOUNTER — Ambulatory Visit

## 2023-10-11 VITALS — BP 139/67 | HR 68 | Temp 98.1°F | Resp 14 | Ht 71.0 in | Wt 195.0 lb

## 2023-10-11 DIAGNOSIS — D509 Iron deficiency anemia, unspecified: Secondary | ICD-10-CM | POA: Diagnosis not present

## 2023-10-11 DIAGNOSIS — I501 Left ventricular failure: Secondary | ICD-10-CM

## 2023-10-11 MED ORDER — IRON SUCROSE 20 MG/ML IV SOLN
200.0000 mg | Freq: Once | INTRAVENOUS | Status: AC
  Administered 2023-10-11: 200 mg via INTRAVENOUS
  Filled 2023-10-11: qty 10

## 2023-10-11 NOTE — Progress Notes (Addendum)
 Cardiology Office Note    Patient Name: Cory [name] Date of Encounter: 10/11/2023  Primary Care Provider:  Janese Medicine., MD Primary Cardiologist:  Kyra Phy, MD Primary Electrophysiologist: None   Past Medical History    Past Medical History:  Diagnosis Date   Atrial flutter (HCC)    BPH (benign prostatic hyperplasia)    CKD (chronic kidney disease), stage III (HCC)    Colon cancer (HCC)    Diabetes mellitus without complication (HCC)    DM (diabetes mellitus), type 2 (HCC)    HLD (hyperlipidemia)    HTN (hypertension)    Hx of right BKA (HCC)    Hyperlipidemia    Hypertension    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   Prostate CA (HCC)    Prostate CA (HCC)    Sepsis (HCC) 11/27/2020    History of Present Illness  Cory [name] is a 78 y.o. 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  Cory [name] was seen initially in 12/28/22 when he was admitted with failure to thrive and developed AF with RVR.  He was treated with IV Cardizem  and Eliquis  5 mg.  It was felt that AF was secondary to severe dehydration and failure to thrive.  He was noted to have elevated troponins secondary to demand ischemia.  2D echo was completed showing EF of 60 to 65% with moderate LVH.  He was seen on 08/18/2023 for follow-up and preoperative clearance.  He was evaluated by his PCP and found to have elevated BP and was started on hydralazine .  During visit his BP was elevated and Micardis  40 mg was started with no SGLT2 due to elevated UTI risk.  He was being considered for colon resection and underwent further risk stratification with 2D echo and coronary CTA.  2D echo was completed showing EF of 55 to 60% with no RWMA and mild LVH and Myoview  was completed that showed no evidence of ischemia and was low risk.  Cory [name]  was seen in the ED on 09/18/2023 after suffering a fall.  He was found unresponsive and was intubated and underwent CT of the head that was negative for traumatic injury.  EEG was completed showing diffuse encephalopathy with no indication of seizures and underwent 2D echo that showed decreased EF of 25-30% with diffuse hypokinesis.  He was found covered and feces and vomit and was treated for aspiration pneumonia.  He was found to be in atrial flutter with elevations in heart rate during intubation and weaning process.  He was found to likely be in DKA due to noncompliance.  He had AKI and Viola Greulich was held during admission.  He was continued on carvedilol  and BiDil  with well-controlled BP.  He was started on Coreg  and converted to sinus rhythm and continued on Eliquis .  He was discharged to an SNF on 09/29/2023.  Cory [name] presents today for posthospital follow-up.In May 2025, he was admitted to the hospital after being found unresponsive following a fall. He experienced hypoglycemia and was diagnosed with encephalopathy. Tests ruled out a seizure, but an echocardiogram indicated decreased heart function. He was treated for suspected pneumonia after vomiting and possible aspiration. During this hospitalization, he experienced a recurrence of atrial fibrillation. He was started on Bidil  and carvedilol  and was discharged to a skilled nursing facility on Sep 29, 2023.He uses Lantus  and Humalog for diabetes management, though there is some confusion about his insulin  regimen. He acknowledges potential medication mix-ups and is unsure if he took insulin  on an empty stomach, which may have contributed to his recent health issues. He is currently unable to monitor his blood sugar at home due to expired equipment but is monitored at the facility. He wants to return home but acknowledges the need for assistance, particularly with medication management. He has family support, including a twin sister and nieces, one of whom  is his power of attorney. He feels he may be inconveniencing them but recognizes the importance of having someone to help prevent future health issues. No chest pain or awareness of fast heartbeats. He reports a single episode of shortness of breath but no ongoing issues. Patient denies dyspnea, PND, orthopnea, nausea, vomiting, dizziness, syncope, edema, weight gain, or early satiety.  Discussed the use of AI scribe software for clinical note transcription with the patient, who gave verbal consent to proceed.  History of Present Illness   Review of Systems  Please see the history of present illness.    All other systems reviewed and are otherwise negative except as noted above.  Physical Exam     Wt Readings from Last 3 Encounters:  10/11/23 195 lb (88.5 kg)  10/09/23 195 lb (88.5 kg)  09/28/23 197 lb 8.5 oz (89.6 kg)   ZO:XWRUE were no vitals filed for this visit.,There is no height or weight on file to calculate BMI. GEN: Well nourished, well developed in no acute distress Neck: No JVD; No carotid bruits Pulmonary: Clear to auscultation without rales, wheezing or rhonchi  Cardiovascular: Normal rate. Regular rhythm. Normal S1. Normal S2.   Murmurs: There is no murmur.  ABDOMEN: Soft, non-tender, non-distended EXTREMITIES:  No edema; No deformity   EKG/LABS/ Recent Cardiac Studies   ECG personally reviewed by me today -none completed today  Risk Assessment/Calculations:    CHA2DS2-VASc Score = 6   This indicates a 9.7% annual risk of stroke. The patient's score is based upon: CHF History: 1 HTN History: 1 Diabetes History: 1 Stroke History: 0 Vascular Disease History: 1 Age Score: 2 Gender Score: 0         Lab Results  Component Value Date   WBC 4.6 09/27/2023   HGB 10.0 (L) 09/27/2023   HCT 29.5 (L) 09/27/2023   MCV 76.2 (L) 09/27/2023   PLT 293 09/27/2023   Lab Results  Component Value Date   CREATININE 1.81 (H) 09/29/2023   BUN 21 09/29/2023   NA 138  09/29/2023   K 3.8 09/29/2023   CL 109 09/29/2023   CO2 15 (L) 09/29/2023   Lab Results  Component Value Date   CHOL 106 09/20/2023   HDL 45 09/20/2023   LDLCALC 39 09/20/2023   TRIG 110 09/20/2023   CHOLHDL 2.4 09/20/2023    Lab Results  Component Value Date   HGBA1C 11.9 (H) 09/19/2023   Assessment & Plan    Assessment and Plan Assessment & Plan Atrial fibrillation with RVR Recurrent atrial fibrillation with rapid ventricular response, previously treated with Cardizem  and Eliquis . Recent episode likely secondary to stress and acute illness. Heart rhythm has returned to normal. - Continue Eliquis  twice daily.  Hypertension Hypertension well-controlled with carvedilol  and Bidil . Blood pressure is stable. - Continue carvedilol  twice daily. - Continue Bidil .  Diabetes mellitus with possible ketoacidosis Diabetes mellitus with a recent episode of possible ketoacidosis leading to unresponsiveness. Unclear if insulin   administration errors contributed. Blood sugar monitoring is essential. - Ensure blood sugar monitoring system is functional before discharge. - Ensure assistance with medication administration.  Wheezing Mild wheezing noted on examination, but no signs of fluid or consolidation in the lungs. Pneumonia has improved. - Continue current medications.    1.  HFmrEF: - Patient appeared euvolemic on exam with no complaints of shortness of breath. - Continue current GDMT with BiDil  20-37.5, Demadex 20 mg, carvedilol  25 mg twice daily - No SGLT2 added due to history of UTI -Low sodium diet, fluid restriction <2L, and daily weights encouraged. Educated to contact our office for weight gain of 2 lbs overnight or 5 lbs in one week.  -We will plan to repeat 2D echo in 3 months.  2.  Essential hypertension: - Patient's blood pressure today was 132/70 - Continue Norvasc  5 mg, carvedilol  25 mg twice daily, BiDil  20-37.5 mg 3 times daily, Micardis  to 40 mg daily  3.   PAF: -Recurrent atrial fibrillation with rapid ventricular response, previously treated with Cardizem  and Eliquis . Recent episode likely secondary to stress and acute illness. Heart rhythm has returned to normal. -Continue carvedilol  25 mg twice daily - Continue Eliquis  twice daily.  4.  DM type II: - Patient's last hemoglobin A1c was 11.9 - Continue current treatment plan with insulin  per PCP - Patient not a candidate for SGLT2 due to history of UTI.   5. Wheezing: Mild wheezing noted on examination, but no signs of fluid or consolidation in the lungs. Pneumonia has improved. - Continue current medications.  6. Preoperative Clearance:  The patient affirms he has been doing well without any new cardiac symptoms. They are able to achieve 4 METS without cardiac limitations. Therefore, based on ACC/AHA guidelines, the patient would be at acceptable risk for the planned procedure without further cardiovascular testing. The patient was advised that if he develops new symptoms prior to surgery to contact our office to arrange for a follow-up visit, and he verbalized understanding.     Disposition: Follow-up with Arun K Thukkani, MD or APP in 3 months    Signed, Francene Ing, Retha Cast, NP 10/11/2023, 2:34 PM [facility]

## 2023-10-11 NOTE — Progress Notes (Signed)
 Diagnosis: Iron Deficiency Anemia  Provider:  Chilton Greathouse MD  Procedure: IV Push  IV Type: Peripheral, IV Location: L Antecubital  Venofer (Iron Sucrose), Dose: 200 mg  Post Infusion IV Care: Observation period completed and Peripheral IV Discontinued  Discharge: Condition: Good, Destination: Home . AVS Provided  Performed by:  Rico Ala, LPN

## 2023-10-12 ENCOUNTER — Encounter: Payer: Self-pay | Admitting: Nurse Practitioner

## 2023-10-12 ENCOUNTER — Ambulatory Visit: Attending: Nurse Practitioner | Admitting: Nurse Practitioner

## 2023-10-12 VITALS — BP 132/70 | HR 66 | Ht 71.0 in | Wt 197.0 lb

## 2023-10-12 DIAGNOSIS — E1122 Type 2 diabetes mellitus with diabetic chronic kidney disease: Secondary | ICD-10-CM

## 2023-10-12 DIAGNOSIS — I483 Typical atrial flutter: Secondary | ICD-10-CM

## 2023-10-12 DIAGNOSIS — E1169 Type 2 diabetes mellitus with other specified complication: Secondary | ICD-10-CM

## 2023-10-12 DIAGNOSIS — I5020 Unspecified systolic (congestive) heart failure: Secondary | ICD-10-CM

## 2023-10-12 DIAGNOSIS — E118 Type 2 diabetes mellitus with unspecified complications: Secondary | ICD-10-CM

## 2023-10-12 DIAGNOSIS — Z794 Long term (current) use of insulin: Secondary | ICD-10-CM

## 2023-10-12 DIAGNOSIS — N183 Chronic kidney disease, stage 3 unspecified: Secondary | ICD-10-CM

## 2023-10-12 DIAGNOSIS — E785 Hyperlipidemia, unspecified: Secondary | ICD-10-CM

## 2023-10-12 NOTE — Patient Instructions (Signed)
 Medication Instructions:  Your physician recommends that you continue on your current medications as directed. Please refer to the Current Medication list given to you today.  *If you need a refill on your cardiac medications before your next appointment, please call your pharmacy*   Follow-Up: At [facility], you and your health needs are our priority.  As part of our continuing mission to provide you with exceptional heart care, our providers are all part of one team.  This team includes your primary Cardiologist (physician) and Advanced Practice Providers or APPs (Physician Assistants and Nurse Practitioners) who all work together to provide you with the care you need, when you need it.  Your next appointment:   3 month(s)  Provider:   Charles Connor, NP        We recommend signing up for the patient portal called "MyChart".  Sign up information is provided on this After Visit Summary.  MyChart is used to connect with patients for Virtual Visits (Telemedicine).  Patients are able to view lab/test results, encounter notes, upcoming appointments, etc.  Non-urgent messages can be sent to your provider as well.   To learn more about what you can do with MyChart, go to [email or web address].

## 2023-10-13 ENCOUNTER — Ambulatory Visit

## 2023-10-13 VITALS — BP 145/86 | HR 65 | Temp 97.8°F | Resp 16 | Ht 71.0 in | Wt 197.0 lb

## 2023-10-13 DIAGNOSIS — I501 Left ventricular failure: Secondary | ICD-10-CM

## 2023-10-13 DIAGNOSIS — D509 Iron deficiency anemia, unspecified: Secondary | ICD-10-CM

## 2023-10-13 MED ORDER — IRON SUCROSE 20 MG/ML IV SOLN
200.0000 mg | Freq: Once | INTRAVENOUS | Status: AC
  Administered 2023-10-13: 200 mg via INTRAVENOUS
  Filled 2023-10-13: qty 10

## 2023-10-13 NOTE — Progress Notes (Signed)
 Diagnosis: Iron Deficiency Anemia  Provider:  Chilton Greathouse MD  Procedure: IV Push  IV Type: Peripheral, IV Location: L Antecubital  Venofer (Iron Sucrose), Dose: 200 mg  Post Infusion IV Care: Observation period completed and Peripheral IV Discontinued  Discharge: Condition: Good, Destination: Home . AVS Provided  Performed by:  Rico Ala, LPN

## 2023-10-17 ENCOUNTER — Telehealth: Payer: Self-pay

## 2023-10-17 ENCOUNTER — Ambulatory Visit

## 2023-10-17 VITALS — BP 149/78 | HR 62 | Temp 98.0°F | Resp 18 | Ht 71.0 in | Wt 190.6 lb

## 2023-10-17 DIAGNOSIS — I501 Left ventricular failure: Secondary | ICD-10-CM

## 2023-10-17 DIAGNOSIS — D509 Iron deficiency anemia, unspecified: Secondary | ICD-10-CM | POA: Diagnosis not present

## 2023-10-17 MED ORDER — IRON SUCROSE 20 MG/ML IV SOLN
200.0000 mg | Freq: Once | INTRAVENOUS | Status: AC
  Administered 2023-10-17: 200 mg via INTRAVENOUS
  Filled 2023-10-17: qty 10

## 2023-10-17 NOTE — Telephone Encounter (Signed)
.     Pre-operative Risk Assessment    Patient Name: Cory [name]  DOB: 1945/10/30 MRN: [id number]   Date of last office visit: 10/12/23 Date of next office visit: 01/12/24   Request for Surgical Clearance    Procedure:  Colon Cancer Robotic Colon Resection  Date of Surgery:  Clearance TBD                                Surgeon:  Candyce Champagne  Surgeon's Group or Practice Name:  [facility] Phone number:  [phone] Fax number:  [phone]   Type of Clearance Requested:   - Medical  - Pharmacy:  Hold Apixaban  (Eliquis )     Type of Anesthesia:  General    Additional requests/questions:    Gardiner Jumper   10/17/2023, 2:44 PM

## 2023-10-17 NOTE — Progress Notes (Signed)
 Diagnosis: Iron Deficiency Anemia  Provider:  Chilton Greathouse MD  Procedure: IV Push  IV Type: Peripheral, IV Location: L Antecubital  Venofer (Iron Sucrose), Dose: 200 mg  Post Infusion IV Care: Observation period completed and Peripheral IV Discontinued  Discharge: Condition: Good, Destination: Home . AVS Provided  Performed by:  Rico Ala, LPN

## 2023-10-19 ENCOUNTER — Inpatient Hospital Stay (HOSPITAL_COMMUNITY)

## 2023-10-19 ENCOUNTER — Encounter (HOSPITAL_COMMUNITY): Payer: Self-pay

## 2023-10-19 ENCOUNTER — Other Ambulatory Visit: Payer: Self-pay

## 2023-10-19 ENCOUNTER — Ambulatory Visit

## 2023-10-19 ENCOUNTER — Emergency Department (HOSPITAL_COMMUNITY)

## 2023-10-19 ENCOUNTER — Inpatient Hospital Stay (HOSPITAL_COMMUNITY)
Admission: EM | Admit: 2023-10-19 | Discharge: 2023-10-21 | DRG: 064 | Disposition: A | Discharge status: Home Health | Attending: Internal Medicine | Admitting: Internal Medicine

## 2023-10-19 DIAGNOSIS — I129 Hypertensive chronic kidney disease with stage 1 through stage 4 chronic kidney disease, or unspecified chronic kidney disease: Secondary | ICD-10-CM | POA: Diagnosis not present

## 2023-10-19 DIAGNOSIS — I6381 Other cerebral infarction due to occlusion or stenosis of small artery: Principal | ICD-10-CM | POA: Diagnosis present

## 2023-10-19 DIAGNOSIS — Z89511 Acquired absence of right leg below knee: Secondary | ICD-10-CM

## 2023-10-19 DIAGNOSIS — D631 Anemia in chronic kidney disease: Secondary | ICD-10-CM | POA: Diagnosis present

## 2023-10-19 DIAGNOSIS — I4892 Unspecified atrial flutter: Secondary | ICD-10-CM | POA: Diagnosis present

## 2023-10-19 DIAGNOSIS — I5022 Chronic systolic (congestive) heart failure: Secondary | ICD-10-CM | POA: Diagnosis present

## 2023-10-19 DIAGNOSIS — E162 Hypoglycemia, unspecified: Secondary | ICD-10-CM | POA: Diagnosis not present

## 2023-10-19 DIAGNOSIS — Z91013 Allergy to seafood: Secondary | ICD-10-CM

## 2023-10-19 DIAGNOSIS — I4819 Other persistent atrial fibrillation: Secondary | ICD-10-CM | POA: Diagnosis present

## 2023-10-19 DIAGNOSIS — E1122 Type 2 diabetes mellitus with diabetic chronic kidney disease: Secondary | ICD-10-CM | POA: Diagnosis present

## 2023-10-19 DIAGNOSIS — I5020 Unspecified systolic (congestive) heart failure: Secondary | ICD-10-CM | POA: Diagnosis present

## 2023-10-19 DIAGNOSIS — E876 Hypokalemia: Secondary | ICD-10-CM | POA: Diagnosis present

## 2023-10-19 DIAGNOSIS — Z79899 Other long term (current) drug therapy: Secondary | ICD-10-CM

## 2023-10-19 DIAGNOSIS — E1169 Type 2 diabetes mellitus with other specified complication: Secondary | ICD-10-CM | POA: Diagnosis present

## 2023-10-19 DIAGNOSIS — R29700 NIHSS score 0: Secondary | ICD-10-CM | POA: Diagnosis present

## 2023-10-19 DIAGNOSIS — E785 Hyperlipidemia, unspecified: Secondary | ICD-10-CM | POA: Diagnosis present

## 2023-10-19 DIAGNOSIS — E1165 Type 2 diabetes mellitus with hyperglycemia: Secondary | ICD-10-CM | POA: Diagnosis present

## 2023-10-19 DIAGNOSIS — E11649 Type 2 diabetes mellitus with hypoglycemia without coma: Secondary | ICD-10-CM | POA: Diagnosis present

## 2023-10-19 DIAGNOSIS — R471 Dysarthria and anarthria: Secondary | ICD-10-CM | POA: Diagnosis present

## 2023-10-19 DIAGNOSIS — N1832 Chronic kidney disease, stage 3b: Secondary | ICD-10-CM | POA: Diagnosis present

## 2023-10-19 DIAGNOSIS — I10 Essential (primary) hypertension: Secondary | ICD-10-CM | POA: Diagnosis present

## 2023-10-19 DIAGNOSIS — N400 Enlarged prostate without lower urinary tract symptoms: Secondary | ICD-10-CM | POA: Diagnosis present

## 2023-10-19 DIAGNOSIS — N184 Chronic kidney disease, stage 4 (severe): Secondary | ICD-10-CM | POA: Diagnosis present

## 2023-10-19 DIAGNOSIS — R55 Syncope and collapse: Secondary | ICD-10-CM

## 2023-10-19 DIAGNOSIS — I130 Hypertensive heart and chronic kidney disease with heart failure and stage 1 through stage 4 chronic kidney disease, or unspecified chronic kidney disease: Secondary | ICD-10-CM | POA: Diagnosis present

## 2023-10-19 DIAGNOSIS — Z794 Long term (current) use of insulin: Secondary | ICD-10-CM | POA: Diagnosis not present

## 2023-10-19 DIAGNOSIS — N183 Chronic kidney disease, stage 3 unspecified: Secondary | ICD-10-CM

## 2023-10-19 DIAGNOSIS — I454 Nonspecific intraventricular block: Secondary | ICD-10-CM | POA: Diagnosis present

## 2023-10-19 DIAGNOSIS — Z85038 Personal history of other malignant neoplasm of large intestine: Secondary | ICD-10-CM

## 2023-10-19 DIAGNOSIS — Z7901 Long term (current) use of anticoagulants: Secondary | ICD-10-CM

## 2023-10-19 DIAGNOSIS — Z91014 Allergy to mammalian meats: Secondary | ICD-10-CM

## 2023-10-19 DIAGNOSIS — I639 Cerebral infarction, unspecified: Secondary | ICD-10-CM | POA: Diagnosis not present

## 2023-10-19 DIAGNOSIS — Z8546 Personal history of malignant neoplasm of prostate: Secondary | ICD-10-CM

## 2023-10-19 DIAGNOSIS — E1151 Type 2 diabetes mellitus with diabetic peripheral angiopathy without gangrene: Secondary | ICD-10-CM | POA: Diagnosis not present

## 2023-10-19 DIAGNOSIS — I4430 Unspecified atrioventricular block: Secondary | ICD-10-CM | POA: Diagnosis present

## 2023-10-19 DIAGNOSIS — D696 Thrombocytopenia, unspecified: Secondary | ICD-10-CM | POA: Diagnosis present

## 2023-10-19 DIAGNOSIS — R4182 Altered mental status, unspecified: Secondary | ICD-10-CM | POA: Diagnosis present

## 2023-10-19 DIAGNOSIS — Z87891 Personal history of nicotine dependence: Secondary | ICD-10-CM

## 2023-10-19 DIAGNOSIS — T68XXXA Hypothermia, initial encounter: Secondary | ICD-10-CM

## 2023-10-19 DIAGNOSIS — G9341 Metabolic encephalopathy: Secondary | ICD-10-CM | POA: Diagnosis present

## 2023-10-19 DIAGNOSIS — Z833 Family history of diabetes mellitus: Secondary | ICD-10-CM

## 2023-10-19 DIAGNOSIS — Z7985 Long-term (current) use of injectable non-insulin antidiabetic drugs: Secondary | ICD-10-CM

## 2023-10-19 DIAGNOSIS — Z8249 Family history of ischemic heart disease and other diseases of the circulatory system: Secondary | ICD-10-CM

## 2023-10-19 DIAGNOSIS — Z888 Allergy status to other drugs, medicaments and biological substances status: Secondary | ICD-10-CM

## 2023-10-19 DIAGNOSIS — I4891 Unspecified atrial fibrillation: Secondary | ICD-10-CM | POA: Diagnosis not present

## 2023-10-19 DIAGNOSIS — Z803 Family history of malignant neoplasm of breast: Secondary | ICD-10-CM

## 2023-10-19 LAB — COMPREHENSIVE METABOLIC PANEL WITH GFR
ALT: 13 U/L (ref 0–44)
AST: 17 U/L (ref 15–41)
Albumin: 2.6 g/dL — ABNORMAL LOW (ref 3.5–5.0)
Alkaline Phosphatase: 63 U/L (ref 38–126)
Anion gap: 7 (ref 5–15)
BUN: 14 mg/dL (ref 8–23)
CO2: 26 mmol/L (ref 22–32)
Calcium: 8.1 mg/dL — ABNORMAL LOW (ref 8.9–10.3)
Chloride: 104 mmol/L (ref 98–111)
Creatinine, Ser: 1.95 mg/dL — ABNORMAL HIGH (ref 0.61–1.24)
GFR, Estimated: 35 mL/min — ABNORMAL LOW (ref >60)
Glucose, Bld: 165 mg/dL — ABNORMAL HIGH (ref 70–99)
Potassium: 2.8 mmol/L — ABNORMAL LOW (ref 3.5–5.1)
Sodium: 137 mmol/L (ref 135–145)
Total Bilirubin: 0.5 mg/dL (ref 0.0–1.2)
Total Protein: 6.3 g/dL — ABNORMAL LOW (ref 6.5–8.1)

## 2023-10-19 LAB — URINALYSIS, ROUTINE W REFLEX MICROSCOPIC
Bilirubin Urine: NEGATIVE
Glucose, UA: NEGATIVE mg/dL
Hgb urine dipstick: NEGATIVE
Ketones, ur: NEGATIVE mg/dL
Nitrite: NEGATIVE
Protein, ur: 100 mg/dL — AB
Specific Gravity, Urine: 1.005 (ref 1.005–1.030)
pH: 7 (ref 5.0–8.0)

## 2023-10-19 LAB — TSH: TSH: 0.688 u[IU]/mL (ref 0.350–4.500)

## 2023-10-19 LAB — ETHANOL: Alcohol, Ethyl (B): <15 mg/dL (ref <15)

## 2023-10-19 LAB — CBC WITH DIFFERENTIAL/PLATELET
Abs Immature Granulocytes: 0.02 10*3/uL (ref 0.00–0.07)
Basophils Absolute: 0 10*3/uL (ref 0.0–0.1)
Basophils Relative: 0 %
Eosinophils Absolute: 0.2 10*3/uL (ref 0.0–0.5)
Eosinophils Relative: 4 %
HCT: 34.8 % — ABNORMAL LOW (ref 39.0–52.0)
Hemoglobin: 11.3 g/dL — ABNORMAL LOW (ref 13.0–17.0)
Immature Granulocytes: 0 %
Lymphocytes Relative: 16 %
Lymphs Abs: 0.8 10*3/uL (ref 0.7–4.0)
MCH: 26.3 pg (ref 26.0–34.0)
MCHC: 32.5 g/dL (ref 30.0–36.0)
MCV: 80.9 fL (ref 80.0–100.0)
Monocytes Absolute: 0.4 10*3/uL (ref 0.1–1.0)
Monocytes Relative: 8 %
Neutro Abs: 3.7 10*3/uL (ref 1.7–7.7)
Neutrophils Relative %: 72 %
Platelets: 146 10*3/uL — ABNORMAL LOW (ref 150–400)
RBC: 4.3 MIL/uL (ref 4.22–5.81)
RDW: 17.3 % — ABNORMAL HIGH (ref 11.5–15.5)
WBC: 5.1 10*3/uL (ref 4.0–10.5)
nRBC: 0 % (ref 0.0–0.2)

## 2023-10-19 LAB — CBG MONITORING, ED
Glucose-Capillary: 133 mg/dL — ABNORMAL HIGH (ref 70–99)
Glucose-Capillary: 139 mg/dL — ABNORMAL HIGH (ref 70–99)

## 2023-10-19 LAB — T4, FREE: Free T4: 1.01 ng/dL (ref 0.61–1.12)

## 2023-10-19 LAB — HEMOGLOBIN A1C
Hgb A1c MFr Bld: 9.6 % — ABNORMAL HIGH (ref 4.8–5.6)
Mean Plasma Glucose: 228.82 mg/dL

## 2023-10-19 LAB — TROPONIN I (HIGH SENSITIVITY)
Troponin I (High Sensitivity): 26 ng/L — ABNORMAL HIGH (ref <18)
Troponin I (High Sensitivity): 29 ng/L — ABNORMAL HIGH (ref <18)

## 2023-10-19 LAB — PROCALCITONIN: Procalcitonin: 1.91 ng/mL

## 2023-10-19 LAB — CK: Total CK: 137 U/L (ref 49–397)

## 2023-10-19 LAB — GLUCOSE, CAPILLARY: Glucose-Capillary: 211 mg/dL — ABNORMAL HIGH (ref 70–99)

## 2023-10-19 MED ORDER — POTASSIUM CHLORIDE 10 MEQ/100ML IV SOLN
10.0000 meq | INTRAVENOUS | Status: AC
  Administered 2023-10-19 (×2): 10 meq via INTRAVENOUS
  Filled 2023-10-19 (×2): qty 100

## 2023-10-19 MED ORDER — ACETAMINOPHEN 325 MG PO TABS
650.0000 mg | ORAL_TABLET | Freq: Four times a day (QID) | ORAL | Status: DC | PRN
Start: 2023-10-19 — End: 2023-10-21

## 2023-10-19 MED ORDER — ONDANSETRON HCL 4 MG/2ML IJ SOLN: 4.0000 mg | Freq: Four times a day (QID) | INTRAMUSCULAR | Status: DC | PRN

## 2023-10-19 MED ORDER — PANTOPRAZOLE SODIUM 40 MG IV SOLR
40.0000 mg | Freq: Two times a day (BID) | INTRAVENOUS | Status: DC
  Administered 2023-10-19 – 2023-10-21 (×4): 40 mg via INTRAVENOUS
  Filled 2023-10-19 (×4): qty 10

## 2023-10-19 MED ORDER — STROKE: EARLY STAGES OF RECOVERY BOOK
Freq: Once | Status: AC
  Filled 2023-10-19: qty 1

## 2023-10-19 MED ORDER — ONDANSETRON HCL 4 MG PO TABS: 4.0000 mg | ORAL_TABLET | Freq: Four times a day (QID) | ORAL | Status: DC | PRN

## 2023-10-19 MED ORDER — INSULIN ASPART 100 UNIT/ML IJ SOLN
0.0000 [IU] | Freq: Three times a day (TID) | INTRAMUSCULAR | Status: DC
  Administered 2023-10-20: 5 [IU] via SUBCUTANEOUS
  Administered 2023-10-20 – 2023-10-21 (×2): 3 [IU] via SUBCUTANEOUS

## 2023-10-19 MED ORDER — POLYETHYLENE GLYCOL 3350 17 G PO PACK: 17.0000 g | PACK | Freq: Every day | ORAL | Status: DC | PRN

## 2023-10-19 MED ORDER — CARVEDILOL 25 MG PO TABS
25.0000 mg | ORAL_TABLET | Freq: Two times a day (BID) | ORAL | Status: DC
  Administered 2023-10-19: 25 mg via ORAL
  Filled 2023-10-19: qty 2

## 2023-10-19 MED ORDER — TORSEMIDE 20 MG PO TABS
10.0000 mg | ORAL_TABLET | Freq: Two times a day (BID) | ORAL | Status: DC
  Administered 2023-10-19: 10 mg via ORAL
  Filled 2023-10-19: qty 1

## 2023-10-19 MED ORDER — SODIUM CHLORIDE 0.9% FLUSH
3.0000 mL | Freq: Two times a day (BID) | INTRAVENOUS | Status: DC
  Administered 2023-10-19 – 2023-10-20 (×2): 5 mL via INTRAVENOUS
  Administered 2023-10-21: 10 mL via INTRAVENOUS

## 2023-10-19 MED ORDER — TAMSULOSIN HCL 0.4 MG PO CAPS
0.4000 mg | ORAL_CAPSULE | Freq: Every day | ORAL | Status: DC
  Administered 2023-10-19: 0.4 mg via ORAL
  Filled 2023-10-19: qty 1

## 2023-10-19 MED ORDER — LACTATED RINGERS IV BOLUS
500.0000 mL | Freq: Once | INTRAVENOUS | Status: AC
  Administered 2023-10-19: 500 mL via INTRAVENOUS

## 2023-10-19 MED ORDER — ISOSORB DINITRATE-HYDRALAZINE 20-37.5 MG PO TABS
2.0000 | ORAL_TABLET | Freq: Three times a day (TID) | ORAL | Status: DC
  Administered 2023-10-19: 2 via ORAL
  Filled 2023-10-19: qty 2

## 2023-10-19 MED ORDER — APIXABAN 5 MG PO TABS
5.0000 mg | ORAL_TABLET | Freq: Two times a day (BID) | ORAL | Status: DC
  Administered 2023-10-19: 5 mg via ORAL
  Filled 2023-10-19: qty 1

## 2023-10-19 MED ORDER — SODIUM CHLORIDE 0.9% FLUSH: 3.0000 mL | INTRAVENOUS | Status: DC | PRN

## 2023-10-19 MED ORDER — SERTRALINE HCL 50 MG PO TABS
50.0000 mg | ORAL_TABLET | Freq: Every day | ORAL | Status: DC
  Administered 2023-10-19 – 2023-10-21 (×3): 50 mg via ORAL
  Filled 2023-10-19 (×3): qty 1

## 2023-10-19 MED ORDER — ACETAMINOPHEN 650 MG RE SUPP: 650.0000 mg | Freq: Four times a day (QID) | RECTAL | Status: DC | PRN

## 2023-10-19 MED ORDER — POTASSIUM CHLORIDE CRYS ER 20 MEQ PO TBCR
40.0000 meq | EXTENDED_RELEASE_TABLET | Freq: Once | ORAL | Status: AC
  Administered 2023-10-19: 40 meq via ORAL
  Filled 2023-10-19: qty 2

## 2023-10-19 MED ORDER — SODIUM CHLORIDE 0.9% FLUSH
3.0000 mL | Freq: Two times a day (BID) | INTRAVENOUS | Status: DC
  Administered 2023-10-19 – 2023-10-21 (×4): 3 mL via INTRAVENOUS

## 2023-10-19 NOTE — ED Triage Notes (Signed)
 PT BIB [facility] from [facility] after facility found PT unresponsive around 0700 in bed. PT states he woke up around 0500 and went back to sleep, and was was witnessed well at 2100 last night. CBG 37, given IM glucagon followed by of D10. R Foot amputation. PT Aox4. Inequal pupils aligns with prev visit. [facility] VS: Bp 144/78, HR 60, O2 97% on RA, RR 14

## 2023-10-19 NOTE — ED Notes (Signed)
 Patient transported to CT

## 2023-10-19 NOTE — ED Provider Notes (Signed)
 [place] EMERGENCY DEPARTMENT AT [facility] Provider Note   CSN: [id number] Arrival date & time: 10/19/23  5621     Patient presents with: Hypoglycemia   Cory [name] is a 78 y.o. male.   78 year old male with past medical history of diabetes, chronic kidney disease, and atrial fibrillation presenting to the emergency department today with altered mental status.  The patient was apparently unresponsive this morning.  He was apparently diaphoretic as well.  They checked his blood sugar at his nursing facility and his blood sugar was in the 30s.  The patient received dextrose  on the way to the emergency department and has had improvement in his mental status.  The patient states that he went to bed last night around 9 PM.  Reports that he thinks that he woke up around 5 or 6 AM and was feeling normal.  He denies any associated chest pain or any other complaints at this time and states that he is feeling back to his baseline.  He does have some dysarthria but states that he thinks that this is normal for him.   Hypoglycemia Associated symptoms: sweats        Prior to Admission medications   Medication Sig Start Date End Date Taking? Authorizing Provider  acetaminophen  (TYLENOL ) 325 MG tablet Take 2 tablets (650 mg total) by mouth every 6 (six) hours as needed for mild pain, headache or fever (or Fever >/= 101). 12/07/20  Yes Gonfa, Taye T, MD  amLODipine  (NORVASC ) 5 MG tablet Take 1 tablet (5 mg total) by mouth daily. 09/30/23  Yes Macdonald Savoy, MD  apixaban  (ELIQUIS ) 5 MG TABS tablet Take 5 mg by mouth 2 (two) times daily.   Yes [provider]  atorvastatin  (LIPITOR) 20 MG tablet Take 20 mg by mouth at bedtime. 06/27/23  Yes [provider]  carvedilol  (COREG ) 25 MG tablet Take 25 mg by mouth 2 (two) times daily with a meal. 03/16/23  Yes [provider]  insulin  lispro (HUMALOG) 100 UNIT/ML injection Inject 0-12 Units into the skin with  breakfast, with lunch, and with evening meal.   Yes [provider]  isosorbide -hydrALAZINE  (BIDIL ) 20-37.5 MG tablet Take 2 tablets by mouth 3 (three) times daily. 09/29/23  Yes Macdonald Savoy, MD  LANTUS  SOLOSTAR 100 UNIT/ML Solostar Pen Inject 33 Units into the skin at bedtime. 11/11/20  Yes [provider]  pantoprazole  (PROTONIX ) 40 MG tablet Take 40 mg by mouth 2 (two) times daily. 06/09/23  Yes [provider]  Potassium Chloride  ER 20 MEQ TBCR Take 20 mEq by mouth daily. 08/03/23  Yes [provider]  sertraline  (ZOLOFT ) 50 MG tablet Take 50 mg by mouth daily. 09/18/23  Yes [provider]  tamsulosin  (FLOMAX ) 0.4 MG CAPS capsule Take 0.4 mg by mouth daily after supper. 06/27/23  Yes [provider]  torsemide (DEMADEX) 20 MG tablet Take 20 mg by mouth daily. 09/05/23  Yes [provider]  TRULICITY  0.75 MG/0.5ML SOAJ Inject 0.75 mg into the skin once a week. Tuesday's 10/10/23  Yes [provider]  Vitamin D, Ergocalciferol, (DRISDOL) 1.25 MG (50000 UNIT) CAPS capsule Take 50,000 Units by mouth once a week. 04/05/23  Yes [provider]  polyethylene glycol (MIRALAX  / GLYCOLAX ) 17 g packet Take 17 g by mouth daily as needed. Patient not taking: Reported on 10/19/2023 05/22/23   Elgergawy, Ardia Kraft, MD  telmisartan  (MICARDIS ) 40 MG tablet Take 1 tablet (40 mg total) by mouth  daily. Patient not taking: Reported on 10/19/2023 08/18/23   Thukkani, Arun K, MD  furosemide  (LASIX ) 20 MG tablet Take 1-2 tablets in the morning for your ankle and foot swelling. Patient not taking: Reported on 11/18/2020 11/12/20 11/27/20  Afton Albright, MD    Allergies: Patient has no known allergies.    Review of Systems  Constitutional:  Positive for diaphoresis.  All other systems reviewed and are negative.   Updated Vital Signs BP (!) 162/74   Pulse 84   Temp (!) 95.8 F (35.4 C) (Oral)   Resp 13   Ht 5' 11 (1.803 m)   Wt 86.2 kg    SpO2 100%   BMI 26.50 kg/m   Physical Exam Vitals and nursing note reviewed.   Gen: NAD Eyes: The patient's right pupil is 4 mm and asymmetrical with minimal reactivity, left pupil is 2 mm and reactive, EOMI HEENT: no oropharyngeal swelling Neck: trachea midline Resp: clear to auscultation bilaterally Card: RRR, no murmurs, rubs, or gallops Abd: nontender, nondistended Extremities: no calf tenderness, no edema Vascular: 2+ radial pulses bilaterally, 2+ DP pulses bilaterally Neuro: Cranial nerves intact, equal strength and sensation throughout bilateral upper and lower extremities with no dysmetria on finger-to-nose testing, mild dysarthria noted, no aphasia noted Skin: no rashes Psyc: acting appropriately   (all labs ordered are listed, but only abnormal results are displayed) Labs Reviewed  CBC WITH DIFFERENTIAL/PLATELET - Abnormal; Notable for the following components:      Result Value   Hemoglobin 11.3 (*)    HCT 34.8 (*)    RDW 17.3 (*)    Platelets 146 (*)    All other components within normal limits  COMPREHENSIVE METABOLIC PANEL WITH GFR - Abnormal; Notable for the following components:   Potassium 2.8 (*)    Glucose, Bld 165 (*)    Creatinine, Ser 1.95 (*)    Calcium  8.1 (*)    Total Protein 6.3 (*)    Albumin 2.6 (*)    GFR, Estimated 35 (*)    All other components within normal limits  URINALYSIS, ROUTINE W REFLEX MICROSCOPIC - Abnormal; Notable for the following components:   APPearance HAZY (*)    Protein, ur 100 (*)    Leukocytes,Ua TRACE (*)    Bacteria, UA RARE (*)    All other components within normal limits  CBG MONITORING, ED - Abnormal; Notable for the following components:   Glucose-Capillary 133 (*)    All other components within normal limits  CBG MONITORING, ED - Abnormal; Notable for the following components:   Glucose-Capillary 139 (*)    All other components within normal limits  CULTURE, BLOOD (ROUTINE X 2)  CULTURE, BLOOD (ROUTINE X  2)  PROCALCITONIN  CK  TROPONIN I (HIGH SENSITIVITY)    EKG: None  Radiology: CT Head Wo Contrast Result Date: 10/19/2023 CLINICAL DATA:  Head trauma, minor (Age >= 65y). EXAM: CT HEAD WITHOUT CONTRAST TECHNIQUE: Contiguous axial images were obtained from the base of the skull through the vertex without intravenous contrast. RADIATION DOSE REDUCTION: This exam was performed according to the departmental dose-optimization program which includes automated exposure control, adjustment of the mA and/or kV according to patient size and/or use of iterative reconstruction technique. COMPARISON:  Head CT and MRI 05/09/2023 FINDINGS: Brain: There is no evidence of an acute infarct, intracranial hemorrhage, mass, midline shift, or extra-axial fluid collection. There is moderate cerebral atrophy. Patchy hypodensities in the cerebral white matter bilaterally are unchanged and nonspecific but compatible with  moderate chronic small vessel ischemic disease. 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 Vascular: Calcified atherosclerosis at the skull base. No hyperdense vessel. Skull: No fracture or suspicious lesion. Sinuses/Orbits: Mild mucosal thickening in the ethmoid sinuses. No significant mastoid fluid. Bilateral cataract extraction. Other: None. IMPRESSION: 1. No evidence of acute intracranial abnormality. 2. Moderate chronic small vessel ischemic disease. Electronically Signed   By: Aundra Lee M.D.   On: 10/19/2023 10:11     Procedures   Medications Ordered in the ED  potassium chloride  10 mEq in 100 mL IVPB (10 mEq Intravenous New Bag/Given 10/19/23 1201)  potassium chloride  SA (KLOR-CON  M) CR tablet 40 mEq (40 mEq Oral Given 10/19/23 1157)  lactated ringers  bolus 500 mL (500 mLs Intravenous New Bag/Given 10/19/23 1159)                                    Medical Decision Making 78 year old male with past medical history of diabetes, chronic kidney disease, and  atrial fibrillation presenting to the emergency department today with altered mental status found to be hypoglycemic as an outpatient.  His mental status has improved with dextrose  here.  I will further evaluate him here with basic labs to evaluate for anemia or electrolyte abnormalities.  Will keep an eye on his blood sugar here.  He does have some anisocoria on exam.  Reports that he has had surgery on his eye so suspect this is likely cause.  Will obtain CT scan to eval for intracranial hemorrhage or mass lesion.  Suspicion for CVA is low at this time.  He does have some mild dysarthria but states that this is chronic.  The patient's potassium is low and he is given potassium here.  He remained mildly hypothermic.  Calls placed to hospitalist service for further observation given this finding.  He still does have a mild dysarthria which is unclear if this that is baseline.  He will be admitted for further evaluation management.  Urine at this point is equivocal.  Hospitalist requested cultures and procalcitonin which is ordered.   Amount and/or Complexity of Data Reviewed Labs: ordered. Radiology: ordered.  Risk Prescription drug management. Decision regarding hospitalization.        Final diagnoses:  Hypoglycemia  Hypokalemia  Hypothermia, initial encounter    ED Discharge Orders     None          Carin Charleston, MD 10/19/23 1245

## 2023-10-19 NOTE — Progress Notes (Signed)
 Patient admitted to room 6E. VS stable and free from pain. Patient oriented to room and call bell in reach.

## 2023-10-19 NOTE — Consult Note (Signed)
 NEUROLOGY CONSULT NOTE   Date of service: October 19, 2023 Patient Name: Cory [name] MRN:  [id number] DOB:  [birth year] Chief Complaint: Altered mental status Requesting Provider: Lavanda Porter, MD  History of Present Illness  Itzel Lowrimore is a 78 y.o. 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  Received dextrose  on the way to the hospital and mentation improved. Admitted for further workup.  MRI brain done as a part of AMS workup that revealed strokes for which neurology was consulted. Patient does not complain of unilateral symptoms at this time.  He does not have a good recollection of what brought him to the hospital but remembers waking up this morning sometime around before 5 AM and the next thing that happened was that he was halfway to the hospital in the ambulance.   LKW: On or around 5 AM Modified rankin score: 3-Moderate disability-requires help but walks WITHOUT assistance, maybe 4 IV Thrombolysis: Nonfocal examination at the time of presentation, hypoglycemia EVT: Nonfocal examination  NIHSS components Score: Comment  1a Level of Conscious 0[x]  1[]  2[]  3[]      1b LOC Questions 0[x]  1[]  2[]       1c LOC Commands 0[x]  1[]  2[]       2 Best Gaze 0[x]  1[]  2[]       3 Visual 0[x]  1[]  2[]  3[]      4 Facial Palsy 0[x]  1[]  2[]  3[]      5a Motor Arm - left 0[x]  1[]  2[]  3[]  4[]  UN[]    5b Motor Arm - Right 0[x]  1[]  2[]  3[]  4[]  UN[]    6a Motor Leg - Left 0[x]  1[]  2[]  3[]  4[]  UN[]    6b Motor Leg - Right 0[x]  1[]  2[]  3[]  4[]  UN[]    7 Limb Ataxia 0[x]  1[]  2[]  UN[]      8 Sensory 0[x]  1[]  2[]  UN[]      9 Best Language 0[x]  1[]  2[]  3[]      10 Dysarthria 0[x]  1[]  2[]  UN[]      11 Extinct. 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   Prostate CA (HCC)    Prostate CA (HCC)    Sepsis (HCC) 11/27/2020    Past Surgical History:  Procedure Laterality Date   AMPUTATION Right 12/04/2020   Procedure: RIGHT BELOW KNEE AMPUTATION;  Surgeon: Timothy Ford, MD;  Location: [facility] OR;  Service: Orthopedics;  Laterality: Right;   APPENDECTOMY     APPLICATION OF WOUND VAC Right 12/04/2020   Procedure: APPLICATION OF WOUND VAC;  Surgeon: Timothy Ford, MD;  Location: [facility] OR;  Service: Orthopedics;  Laterality: Right;    Family  History: Family History  Problem Relation Age of Onset   Diabetes Mother    Hypertension Mother    Diabetes Father    Hypertension Father    Cancer Sister 80       breast cancer    Social History  reports that he has quit smoking. His smoking use included cigarettes. He has never used smokeless tobacco. He reports that he does not drink alcohol and does not use drugs.  Allergies  Allergen Reactions   Caffeine    Fish Allergy    Pork-Derived Products    Shellfish Allergy     Medications   Current Facility-Administered Medications:    acetaminophen  (TYLENOL ) tablet 650 mg, 650 mg, Oral, Q6H PRN **OR** acetaminophen  (TYLENOL ) suppository 650 mg, 650 mg, Rectal, Q6H PRN, Lavanda Porter, MD   [START ON 10/20/2023] insulin  aspart (novoLOG ) injection 0-15 Units, 0-15 Units, Subcutaneous, TID WC, Patel, Ekta V, MD   ondansetron  (ZOFRAN ) tablet 4 mg, 4 mg, Oral, Q6H  PRN **OR** ondansetron  (ZOFRAN ) injection 4 mg, 4 mg, Intravenous, Q6H PRN, Lavanda Porter, MD   pantoprazole  (PROTONIX ) injection 40 mg, 40 mg, Intravenous, Q12H, Patel, Ekta V, MD   polyethylene glycol (MIRALAX  / GLYCOLAX ) packet 17 g, 17 g, Oral, Daily PRN, Lavanda Porter, MD   sertraline  (ZOLOFT ) tablet 50 mg, 50 mg, Oral, Daily, Brunilda Capra V, MD, 50 mg at 2023/11/16 1433   sodium chloride  flush (NS) 0.9 % injection 3 mL, 3 mL, Intravenous, Q12H, Brunilda Capra V, MD, 3 mL at 11-16-2023 1341   sodium chloride  flush (NS) 0.9 % injection 3-10 mL, 3-10 mL, Intravenous, Q12H, Brunilda Capra V, MD, 5 mL at 11/16/23 1341   sodium chloride  flush (NS) 0.9 % injection 3-10 mL, 3-10 mL, Intravenous, PRN, Lavanda Porter, MD  Vitals   Vitals:   2023/11/16 1335 November 16, 2023 1457 2023-11-16 1722 11/16/23 2048  BP: (!) 144/102 (!) 160/103 (!) 165/122 119/64  Pulse: 72 84 69 71  Resp: 16 16  (!) 21  Temp: 97.7 F (36.5 C) 97.7 F (36.5 C) 97.7 F (36.5 C) 98.4 F (36.9 C)  TempSrc: Oral Oral Oral Oral  SpO2: 100% 99% 100% 99%  Weight:    85.2 kg  Height:        Body mass index is 26.2 kg/m.   Physical Exam  General: Well-developed well-nourished HEENT: Normocephalic/atraumatic CVS: Regular rate rhythm Respiratory: Breathing well saturating normally on room air Extremities: Right BKA. Neurological exam Awake alert oriented x 3 No dysarthria Aphasia Cranial nerves II to XII intact Motor examination with no drift in any of the 4 extremities Sensation intact light touch without extinction No gross dysmetria noted   Labs/Imaging/Neurodiagnostic studies   CBC:  Recent Labs  Lab November 16, 2023 0910  WBC 5.1  NEUTROABS 3.7  HGB 11.3*  HCT 34.8*  MCV 80.9  PLT 146*   Basic Metabolic Panel:  Lab Results  Component Value Date   NA 137 11/16/2023   K 2.8 (L) 11/16/2023   CO2 26 11/16/2023   GLUCOSE 165 (H) 11/16/2023   BUN 14 Nov 16, 2023   CREATININE 1.95 (H) 11/16/23   CALCIUM  8.1 (L) November 16, 2023    GFRNONAA 35 (L) 11-16-2023   Lipid Panel:  Lab Results  Component Value Date   LDLCALC 39 09/20/2023   HgbA1c:  Lab Results  Component Value Date   HGBA1C 9.6 (H) November 16, 2023   Urine Drug Screen:     Component Value Date/Time   LABOPIA NONE DETECTED 09/18/2023 1406  COCAINSCRNUR NONE DETECTED 09/18/2023 1406   COCAINSCRNUR Negative 05/10/2023 1145   LABBENZ NONE DETECTED 09/18/2023 1406   AMPHETMU NONE DETECTED 09/18/2023 1406   THCU NONE DETECTED 09/18/2023 1406   LABBARB NONE DETECTED 09/18/2023 1406    Alcohol Level     Component Value Date/Time   ETH <15 10/19/2023 2020   INR  Lab Results  Component Value Date   INR 1.3 (H) 09/18/2023   APTT  Lab Results  Component Value Date   APTT 37 (H) 05/18/2023   CT Head without contrast(Personally reviewed): No acute findings  MRI Brain(Personally reviewed): Small volume acute left parietal infarcts  2D echo 09/19/2023-LVEF 25 to 30%, global hypokinesis of the left ventricle.  LA normal in size.  There is right bowing of the interatrial septum suggestive of elevated left atrial pressure.  The atrial septum is grossly normal.  Carotid Dopplers unremarkable  ASSESSMENT   Sal Spratley is a 78 y.o. male past history of diabetes, CKD stage III, colon cancer, atrial fibrillation on Eliquis , prior history of seizures secondary to hyperglycemia, BKA on the right in 2022, prior history of stroke, brought in for altered mental status in the setting of hypoglycemia.  MRI revealed left parietal infarct. Etiology likely cardioembolic versus concomitant small vessel disease due to risk factors  Impression: Acute ischemic strokes  RECOMMENDATIONS  Frequent neurochecks Telemetry He had already received Eliquis  for the day.  Hold Eliquis  for a day or so before resuming it.  1 to make sure that there is no hemorrhagic transformation in the areas of the acute stroke. For now aspirin should be okay-can start tomorrow seen he has  already received Eliquis  for today. High intensity statin for goal LDL less than 70 Check A1c-goal should be less than 7. Echocardiogram was done within the last month-no need to repeat Already has had carotid Dopplers done.  For the head imaging evaluation, MRA head without contrast. PT OT  Speech therapy N.p.o. until cleared by bedside swallow evaluation Allow for permissive hypertension-treat only if systolic blood pressure greater than 220.  Received his home blood pressure medications prior to the neurological consultation.  Okay to hold antihypertensives for now. Stroke order set has been ordered Plan was discussed with Dr. Lydia Sams Stroke team to follow  ______________________________________________________________________    Signed, Tona Francis, MD [facility]

## 2023-10-19 NOTE — H&P (Addendum)
 History and Physical    PatientAmerigo [name] QMV:[id number] DOB: Nov 03, 1945 DOA: 10/19/2023 DOS: the patient was seen and examined on 10/19/2023 PCP: Janese Medicine., MD  Patient coming from: SNF Chief complaint: Chief Complaint  Patient presents with   Hypoglycemia   HPI:  Waino Mounsey is a 78 y.o. 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 HR on telemetry is in A-flutter.  Chart review shows patient was seen for hospital follow-up by cardiology in May 2025 where he had a similar presentation of being found unresponsive found to be hypoglycemic and encephalopathic.   ED Course: Pt in ed at bedside  is alert and oriented and no distress.  Vital signs in the ED were notable for the following:  Vitals:   10/19/23 1335 10/19/23 1457 10/19/23 1722 10/19/23 2048  BP: (!) 144/102 (!) 160/103 (!) 165/122 119/64  Pulse: 72 84 69 71  Temp: 97.7 F (36.5 C) 97.7 F (36.5 C) 97.7 F (36.5 C) 98.4 F (36.9 C)  Resp: 16 16  (!) 21  Height:      Weight:    85.2 kg  SpO2: 100% 99% 100% 99%  TempSrc: Oral Oral Oral Oral  BMI (Calculated):    26.21  >>ED evaluation thus far shows: -CBC showing hemoglobin of 11.3 hematocrit of 34.8 RDW of 17.3 and platelets of 146. - CMP showing potassium 2.8 glucose 165 serum creatinine 1.95 EGFR of 35, total protein 6.3 albumin of 2.6. -Urinalysis is showing hazy urine with 100 protein trace leukocytes and rare bacteria. - Blood cultures collected in the ED. - CPK ordered. - Procalcitonin/prolactin both ordered. -Hemoglobin A1c ordered today is 9.6. - EKG ordered.  >>While in the ED patient received the following: Half a liter of LR, potassium chloride  replaced in the  ED, head CT, lab work.  Review of Systems  All other systems reviewed and are negative.  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 His smoking use included cigarettes. He has never used smokeless tobacco. He reports that he does not drink alcohol and does not use drugs. Allergies  Allergen Reactions   Caffeine    Fish Allergy    Pork-Derived Products    Shellfish Allergy  Family History  Problem Relation Age of Onset   Diabetes Mother    Hypertension Mother    Diabetes Father    Hypertension Father    Cancer Sister 21       breast cancer   Prior to Admission medications   Medication Sig Start Date End Date Taking? Authorizing Provider  acetaminophen  (TYLENOL ) 325 MG tablet Take 2 tablets (650 mg total) by mouth every 6 (six) hours as needed for mild pain, headache or fever (or Fever >/= 101). 12/07/20  Yes Gonfa, Taye T, MD  amLODipine  (NORVASC ) 5 MG tablet Take 1 tablet (5 mg total) by mouth daily. 09/30/23  Yes Macdonald Savoy, MD  apixaban  (ELIQUIS ) 5 MG TABS tablet Take 5 mg by mouth 2 (two)  times daily.   Yes [provider]  atorvastatin  (LIPITOR) 20 MG tablet Take 20 mg by mouth at bedtime. 06/27/23  Yes [provider]  carvedilol  (COREG ) 25 MG tablet Take 25 mg by mouth 2 (two) times daily with a meal. 03/16/23  Yes [provider]  insulin  lispro (HUMALOG) 100 UNIT/ML injection Inject 0-12 Units into the skin with breakfast, with lunch, and with evening meal.   Yes [provider]  isosorbide -hydrALAZINE  (BIDIL ) 20-37.5 MG tablet Take 2 tablets by mouth 3 (three) times daily. 09/29/23  Yes Macdonald Savoy, MD  LANTUS  SOLOSTAR 100 UNIT/ML Solostar Pen Inject 33 Units into the skin at bedtime. 11/11/20  Yes [provider]  pantoprazole  (PROTONIX ) 40 MG tablet Take 40 mg by mouth 2 (two) times daily. 06/09/23  Yes [provider]  Potassium Chloride  ER 20 MEQ TBCR Take 20 mEq by mouth daily. 08/03/23  Yes [provider]  sertraline  (ZOLOFT ) 50 MG tablet Take 50 mg by mouth daily. 09/18/23  Yes [provider]  tamsulosin  (FLOMAX ) 0.4 MG CAPS capsule Take 0.4 mg by mouth daily after supper. 06/27/23  Yes [provider]  torsemide (DEMADEX) 20 MG tablet Take 20 mg by mouth daily. 09/05/23  Yes [provider]  TRULICITY  0.75 MG/0.5ML SOAJ Inject 0.75 mg into the skin once a week. Tuesday's 10/10/23  Yes [provider]  Vitamin D, Ergocalciferol, (DRISDOL) 1.25 MG (50000 UNIT) CAPS capsule Take 50,000 Units by mouth once a week. 04/05/23  Yes [provider]  polyethylene glycol (MIRALAX  / GLYCOLAX ) 17 g packet Take 17 g by mouth daily as needed. Patient not taking: Reported on 10/19/2023 05/22/23   Elgergawy, Ardia Kraft, MD  telmisartan  (MICARDIS ) 40 MG tablet Take 1 tablet (40 mg total) by mouth daily. Patient not taking: Reported on 10/19/2023 08/18/23   Thukkani, Arun K, MD  furosemide  (LASIX ) 20 MG tablet Take 1-2 tablets in the morning for your ankle and foot swelling. Patient not  taking: Reported on 11/18/2020 11/12/20 11/27/20  Afton Albright, MD                                                                                 Vitals:   10/19/23 1335 10/19/23 1457 10/19/23 1722 10/19/23 2048  BP: (!) 144/102 (!) 160/103 (!) 165/122 119/64  Pulse: 72 84 69 71  Resp: 16 16  (!) 21  Temp:  97.7 F (36.5 C) 97.7 F (36.5 C) 97.7 F (36.5 C) 98.4 F (36.9 C)  TempSrc: Oral Oral Oral Oral  SpO2: 100% 99% 100% 99%  Weight:    85.2 kg  Height:       Physical Exam Vitals and nursing note reviewed.  Constitutional:      General: He is not in acute distress. HENT:     Head: Normocephalic and atraumatic.     Right Ear: Hearing and external ear normal.     Left Ear: Hearing and external ear normal.     Nose: No nasal deformity.     Mouth/Throat:     Lips: Pink.     Mouth: Mucous membranes are dry.   Eyes:     General: Lids are normal.     Extraocular Movements: Extraocular movements intact.     Pupils: Pupils are unequal.     Right eye: Pupil is sluggish.     Left eye: Pupil is sluggish.    Cardiovascular:     Rate and Rhythm: Normal rate. Rhythm irregular.     Heart sounds: Normal heart sounds.  Pulmonary:     Effort: Pulmonary effort is normal.     Breath sounds: Examination of the right-lower field reveals wheezing. Examination of the left-lower field reveals wheezing. Wheezing present.     Comments: Mild basilar wheezing.   Abdominal:     General: Bowel sounds are normal. There is no distension.     Palpations: Abdomen is soft. There is no mass.     Tenderness: There is no abdominal tenderness.   Musculoskeletal:     Right lower leg: No edema.     Left lower leg: No edema.       Legs:     Amputation Right Lower Extremity: Right leg is amputated below knee.   Skin:    General: Skin is warm.   Neurological:     General: No focal deficit present.     Mental Status: He is alert and oriented to person, place, and time.     Cranial Nerves: Cranial  nerves 2-12 are intact.     Comments: Anisocoria right eye is  3 mm  left eye  2 mm.  In Trauma MD note pt had anisocoria documented.    Psychiatric:        Speech: Speech normal.     Labs on Admission: I have personally reviewed following labs and imaging studies CBC: Recent Labs  Lab 10/19/23 0910  WBC 5.1  NEUTROABS 3.7  HGB 11.3*  HCT 34.8*  MCV 80.9  PLT 146*   Basic Metabolic Panel: Recent Labs  Lab 10/19/23 0910  NA 137  K 2.8*  CL 104  CO2 26  GLUCOSE 165*  BUN 14  CREATININE 1.95*  CALCIUM  8.1*   GFR: Estimated Creatinine Clearance: 34.3 mL/min (A) (by C-G formula based on SCr of 1.95 mg/dL (H)). Liver Function Tests: Recent Labs  Lab 10/19/23 0910  AST 17  ALT 13  ALKPHOS 63  BILITOT 0.5  PROT 6.3*  ALBUMIN 2.6*   No results for input(s): LIPASE, AMYLASE in the last 168 hours. No results for input(s): AMMONIA in the last 168 hours. Coagulation Profile: No results for input(s): INR, PROTIME in the last 168 hours. Cardiac Enzymes: Recent Labs  Lab 10/19/23 1244  CKTOTAL 137   BNP (last 3 results) No results for input(s): PROBNP in the last 8760 hours. HbA1C: Recent Labs    10/19/23 1536  HGBA1C  9.6*   CBG: Recent Labs  Lab 10/19/23 0837 10/19/23 0943  GLUCAP 133* 139*   Lipid Profile: No results for input(s): CHOL, HDL, LDLCALC, TRIG, CHOLHDL, LDLDIRECT in the last 72 hours. Thyroid Function Tests: Recent Labs    10/19/23 1244 10/19/23 1301  TSH  --  0.688  FREET4 1.01  --    Anemia Panel: No results for input(s): VITAMINB12, FOLATE, FERRITIN, TIBC, IRON , RETICCTPCT in the last 72 hours. Urine analysis:    Component Value Date/Time   COLORURINE YELLOW 10/19/2023 0841   APPEARANCEUR HAZY (A) 10/19/2023 0841   LABSPEC 1.005 10/19/2023 0841   PHURINE 7.0 10/19/2023 0841   GLUCOSEU NEGATIVE 10/19/2023 0841   HGBUR NEGATIVE 10/19/2023 0841   BILIRUBINUR NEGATIVE 10/19/2023 0841    KETONESUR NEGATIVE 10/19/2023 0841   PROTEINUR 100 (A) 10/19/2023 0841   NITRITE NEGATIVE 10/19/2023 0841   LEUKOCYTESUR TRACE (A) 10/19/2023 0841   Radiological Exams on Admission: DG Chest 1 View Result Date: 10/19/2023 CLINICAL DATA:  Altered mental status EXAM: CHEST  1 VIEW COMPARISON:  09/20/2023 FINDINGS: Heart and mediastinal contours are within normal limits. No focal opacities or effusions. No acute bony abnormality. IMPRESSION: No active disease. Electronically Signed   By: Janeece Mechanic M.D.   On: 10/19/2023 20:32   MR BRAIN WO CONTRAST Result Date: 10/19/2023 CLINICAL DATA:  Mental status change, unknown cause. Syncope/presyncope, cerebrovascular cause suspected. EXAM: MRI HEAD WITHOUT CONTRAST TECHNIQUE: Multiplanar, multiecho pulse sequences of the brain and surrounding structures were obtained without intravenous contrast. COMPARISON:  Head CT 10/19/2023 and MRI 05/09/2023 FINDINGS: Brain: There are several subcentimeter acute infarcts involving white matter and cortex in the left parietal lobe. A chronic subcortical infarct is again noted anteriorly in the right frontal lobe, and there is also an unchanged tiny chronic cortical infarct in the left superior frontal gyrus. Patchy to confluent T2 hyperintensities in the cerebral white matter bilaterally are similar to the prior MRI and are nonspecific but compatible with moderate chronic small vessel ischemic disease. There are chronic lacunar infarcts in the thalami and cerebellum. There is moderate cerebral atrophy. Chronic microhemorrhages are present in the the deep gray nuclei as well as more peripheral portions of both cerebral hemispheres have increased in number from the prior MRI and are nonspecific but may be related to chronic hypertension and chronic ischemia. No mass, midline shift, or extra-axial fluid collection is identified. Vascular: Major intracranial vascular flow voids are preserved. Skull and upper cervical spine: No  suspicious marrow lesion. 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 Minimal mucosal thickening in the paranasal sinuses. No significant mastoid fluid. Other: None. IMPRESSION: 1. Small acute left parietal infarcts. 2. Moderate chronic small vessel ischemic disease. Electronically Signed   By: Aundra Lee M.D.   On: 10/19/2023 16:51   VAS US  CAROTID Result Date: 10/19/2023 Carotid Arterial Duplex Study Patient Name:  Jermon Boschee  Date of Exam:   10/19/2023 Medical Rec #: [id number]     Accession #:    [id number] Date of Birth: 08-Apr-1946     Patient Gender: M Patient Age:   52 years Exam Location:  [facility] Procedure:      VAS US  CAROTID Referring Phys: Adeola Dennen --------------------------------------------------------------------------------  Indications:      Syncope. Risk Factors:     Hypertension, hyperlipidemia, Diabetes, past history of                   smoking. Other Factors:  CKD, Afib, CHF,. Comparison Study: Previous exam on 02/18/2023 was WNL Performing Technologist: Arlyce Berger RVT, RDMS  Examination Guidelines: A complete evaluation includes B-mode imaging, spectral Doppler, color Doppler, and power Doppler as needed of all accessible portions of each vessel. Bilateral testing is considered an integral part of a complete examination. Limited examinations for reoccurring indications may be performed as noted.  Right Carotid Findings: +----------+--------+--------+--------+------------------+--------+           PSV cm/sEDV cm/sStenosisPlaque DescriptionComments +----------+--------+--------+--------+------------------+--------+ CCA Prox  60      11                                         +----------+--------+--------+--------+------------------+--------+ CCA Distal37      6                                          +----------+--------+--------+--------+------------------+--------+ ICA Prox  31      9                                           +----------+--------+--------+--------+------------------+--------+ ICA Distal33      8                                          +----------+--------+--------+--------+------------------+--------+ ECA       51      0                                          +----------+--------+--------+--------+------------------+--------+ +----------+--------+-------+----------------+-------------------+           PSV cm/sEDV cmsDescribe        Arm Pressure (mmHG) +----------+--------+-------+----------------+-------------------+ Subclavian70             Multiphasic, WNL                    +----------+--------+-------+----------------+-------------------+ +---------+--------+--+--------+-+---------+ VertebralPSV cm/s29EDV cm/s9Antegrade +---------+--------+--+--------+-+---------+  Left Carotid Findings: +----------+--------+--------+--------+------------------+--------+           PSV cm/sEDV cm/sStenosisPlaque DescriptionComments +----------+--------+--------+--------+------------------+--------+ CCA Prox  80      12                                         +----------+--------+--------+--------+------------------+--------+ CCA Distal47      10                                         +----------+--------+--------+--------+------------------+--------+ ICA Prox  36      10                                         +----------+--------+--------+--------+------------------+--------+ ICA Distal40      13                                         +----------+--------+--------+--------+------------------+--------+  ECA       43      0                                          +----------+--------+--------+--------+------------------+--------+ +----------+--------+--------+----------------+-------------------+           PSV cm/sEDV cm/sDescribe        Arm Pressure (mmHG)  +----------+--------+--------+----------------+-------------------+ [id number]             Multiphasic, WNL                    +----------+--------+--------+----------------+-------------------+ +---------+--------+--+--------+-+---------+ VertebralPSV cm/s19EDV cm/s4Antegrade +---------+--------+--+--------+-+---------+   Summary: Right Carotid: The extracranial vessels were near-normal with only minimal wall                thickening or plaque. Left Carotid: The extracranial vessels were near-normal with only minimal wall               thickening or plaque. Vertebrals:  Bilateral vertebral arteries demonstrate antegrade flow. Subclavians: Normal flow hemodynamics were seen in bilateral subclavian              arteries. *See table(s) above for measurements and observations.     Preliminary    CT Head Wo Contrast Result Date: 10/19/2023 CLINICAL DATA:  Head trauma, minor (Age >= 65y). EXAM: CT HEAD WITHOUT CONTRAST TECHNIQUE: Contiguous axial images were obtained from the base of the skull through the vertex without intravenous contrast. RADIATION DOSE REDUCTION: This exam was performed according to the departmental dose-optimization program which includes automated exposure control, adjustment of the mA and/or kV according to patient size and/or use of iterative reconstruction technique. COMPARISON:  Head CT and MRI 05/09/2023 FINDINGS: Brain: There is no evidence of an acute infarct, intracranial hemorrhage, mass, midline shift, or extra-axial fluid collection. There is moderate cerebral atrophy. Patchy hypodensities in the cerebral white matter bilaterally are unchanged and nonspecific but compatible with moderate chronic small vessel ischemic disease. 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 Vascular: Calcified atherosclerosis at the skull base. No hyperdense vessel. Skull: No fracture or suspicious lesion. Sinuses/Orbits: Mild mucosal thickening in the  ethmoid sinuses. No significant mastoid fluid. Bilateral cataract extraction. Other: None. IMPRESSION: 1. No evidence of acute intracranial abnormality. 2. Moderate chronic small vessel ischemic disease. Electronically Signed   By: Aundra Lee M.D.   On: 10/19/2023 10:11   Data Reviewed: Relevant notes from primary care and specialist visits, past discharge summaries as available in EHR, including Care Everywhere . Prior diagnostic testing as pertinent to current admission diagnoses, Updated medications and problem lists for reconciliation .ED course, including vitals, labs, imaging, treatment and response to treatment,Triage notes, nursing and pharmacy notes and ED provider's notes.Notable results as noted in HPI.Discussed case with EDMD/ ED APP/ or Specialty MD on call and as needed.  Assessment & Plan  >>AMS: Suspect from metabolic as pt was hypoglycemia.  D/d include infectious or cardiac etiology and we will follow.  EKG was pending. EKG shows a flutter with variable AV block QTc is 456, similar to previous EKG.   >> A flutter: Currently patient's currently I have held his Eliquis , Coreg , amlodipine , Micardis , BiDil , to prevent hypotension  from now since positive results of MRI and have spoken to pt about  positive results.  Cardiology consult in am per am team.    >> Acute CVA:  Patient presented today with altered mental status unresponsiveness found to be hypoglycemic initially admitted for metabolic encephalopathy however I ordered an MRI because of his anisocoria and also his history of CVA.  MRI returned positive in the evening showing small acute left parietal infarct and moderate chronic small vessel ischemic disease.  EKG done at bedside by floor nurse was similar to his previous 1 showing atrial flutter with a QTc of 456.  Have discussed case with neurology  Dr. Evalee Hila and plan to hold bp meds / eliquis  and monitor.  Aspiration fall precautions PT OT speech therapy.   >>DM  II: Patient's diabetes is poorly controlled as last A1c was 11.9, I am not certain how much of this is due to patient's understanding of his treatment plan with making him compliant with the regimen. Suspect patient will do well with long-acting insulin  twice a day with the Januvia coverage along with low-dose metformin 3 times daily.  Currently due to patient's hypoglycemia I have held diabetes treatment plan, and started patient on POCT for every 2 hours x 24 hours will defer to a.m. team to start patient on glycemic protocol. Plan changed and glycemic protocol started.     >> Essential hypertension: Vitals:   10/19/23 0900 10/19/23 1335 10/19/23 1457 10/19/23 1722  BP: (!) 162/74 (!) 144/102 (!) 160/103 (!) 165/122   10/19/23 2048  BP: 119/64  Will hold all meds for now to allow for some degree of permissive hypertension with systolic goals in 160s unless otherwise specified by neurology.   >> CKD stage IIIb: Lab Results  Component Value Date   CREATININE 1.95 (H) 10/19/2023   CREATININE 1.81 (H) 09/29/2023   CREATININE 1.69 (H) 09/28/2023  Avoid contrast and renally dose medications.   >> Anemia/ Thrombocytopenia: CBC yesterday showing anemia with a hemoglobin of 11.3.  Normal white count, normal MCV, RDW 17.3.  Overall his hemoglobin is stable will defer to PCP for additional investigation as patient has a history of colon cancer. Thrombocytopenia noted, no history of alcohol abuse reported, levels are pending.  Chart review shows thrombocytopenia has been only intermittent.  LFTs within normal limits no history of cirrhosis.    Latest Ref Rng & Units 10/19/2023    9:10 AM 09/27/2023    5:24 AM 09/25/2023    6:55 AM  CBC  WBC 4.0 - 10.5 K/uL 5.1  4.6  6.1   Hemoglobin 13.0 - 17.0 g/dL 16.1  09.6  04.5   Hematocrit 39.0 - 52.0 % 34.8  29.5  32.0   Platelets 150 - 400 K/uL 146  293  239     >>Elevated PCL: Suspect from stroke, wheezing in both bases as minimal and do not  suspect pneumonia chest x-ray is negative.  If patient gets worse or has a fever or white count cough shortness of breath will consider repeat imaging for possible aspiration pneumonia.   DVT prophylaxis:  Eliquis  given currently held Consults:  Neurology Advance Care Planning:    Code Status: Full Code   Family Communication:  None Disposition Plan:  Home Severity of Illness: The appropriate patient status for this patient is INPATIENT. Inpatient status is judged to be reasonable and necessary in order to provide the required intensity of service to ensure the patient's safety. The patient's presenting symptoms, physical exam findings, and initial radiographic and laboratory data in the context of their chronic comorbidities is felt to place them at high risk for further clinical deterioration. Furthermore, it is not anticipated  that the patient will be medically stable for discharge from the hospital within 2 midnights of admission.   * I certify that at the point of admission it is my clinical judgment that the patient will require inpatient hospital care spanning beyond 2 midnights from the point of admission due to high intensity of service, high risk for further deterioration and high frequency of surveillance required.*  Unresulted Labs (From admission, onward)     Start     Ordered   10/19/23 2009  Rapid urine drug screen (hospital performed)  Add-on,   AD        10/19/23 2008   10/19/23 2009  Ethanol  Add-on,   AD        10/19/23 2008   10/19/23 1249  Prolactin  Add-on,   AD        10/19/23 1249   10/19/23 1243  Blood culture (routine x 2)  BLOOD CULTURE X 2,   R      10/19/23 1243            Meds ordered this encounter  Medications   potassium chloride  SA (KLOR-CON  M) CR tablet 40 mEq   potassium chloride  10 mEq in 100 mL IVPB   lactated ringers  bolus 500 mL   DISCONTD: isosorbide -hydrALAZINE  (BIDIL ) 20-37.5 MG per tablet 2 tablet   DISCONTD: carvedilol  (COREG )  tablet 25 mg   DISCONTD: torsemide (DEMADEX) tablet 10 mg   sertraline  (ZOLOFT ) tablet 50 mg   DISCONTD: apixaban  (ELIQUIS ) tablet 5 mg   DISCONTD: tamsulosin  (FLOMAX ) capsule 0.4 mg   sodium chloride  flush (NS) 0.9 % injection 3 mL   OR Linked Order Group    acetaminophen  (TYLENOL ) tablet 650 mg    acetaminophen  (TYLENOL ) suppository 650 mg   polyethylene glycol (MIRALAX  / GLYCOLAX ) packet 17 g   OR Linked Order Group    ondansetron  (ZOFRAN ) tablet 4 mg    ondansetron  (ZOFRAN ) injection 4 mg   sodium chloride  flush (NS) 0.9 % injection 3-10 mL   sodium chloride  flush (NS) 0.9 % injection 3-10 mL   pantoprazole  (PROTONIX ) injection 40 mg   insulin  aspart (novoLOG ) injection 0-15 Units    Correction coverage::   Moderate (average weight, post-op)    CBG < 70::   implement hypoglycemia protocol    CBG 70 - 120::   0 units    CBG 121 - 150::   2 units    CBG 151 - 200::   3 units    CBG 201 - 250::   5 units    CBG 251 - 300::   8 units    CBG 301 - 350::   11 units    CBG 351 - 400::   15 units    CBG > 400:   call MD and obtain STAT lab verification     Orders Placed This Encounter  Procedures   Blood culture (routine x 2)   CT Head Wo Contrast   MR BRAIN WO CONTRAST   DG Chest 1 View   CBC with Differential   Comprehensive metabolic panel   Urinalysis, Routine w reflex microscopic -Urine, Clean Catch   Procalcitonin   CK   Prolactin   TSH   T4, free   Hemoglobin A1c   Rapid urine drug screen (hospital performed)   Ethanol   Diet Carb Modified Fluid consistency: Thin; Room service appropriate? Yes   Cardiac monitoring   Maintain IV access   Vital signs   Orthostatic vital  signs on admission   Notify physician (specify)   Refer to Sidebar Report Refer to ICU, Med-Surg, Progressive, and Step-Down Mobility Protocol Sidebars   Daily weights   If patient diabetic or glucose greater than 140 notify physician for Sliding Scale Insulin  Orders   Intake and output   Neuro  checks   Apply Syncope Care Plan   Initiate Oral Care Protocol   Initiate Carrier Fluid Protocol   Swallow screen   TED hose   Apply Diabetes Mellitus Care Plan   STAT CBG when hypoglycemia is suspected. If treated, recheck every 15 minutes after each treatment until CBG >/= 70 mg/dl   Refer to Hypoglycemia Protocol Sidebar Report for treatment of CBG < 70 mg/dl   No HS correction Insulin    Full code   Consult to hospitalist   Consult to Transition of Care   Consult to neurology Consult Timeframe: ROUTINE - requires response within 24 hours; Reason for Consult? acute cva.   Contact precautions   OT eval and treat   PT eval and treat   Pulse oximetry with vital signs   Pulse oximetry, continuous   SLP eval and treat Reason for evaluation: .Swallowing evaluation (BSE, MBS and/or diet order as indicated)   CBG monitoring, ED   CBG monitoring, ED   EKG 12-Lead   EKG 12-Lead   Insert peripheral IV   Insert saline lock   Admit to Inpatient (patient's expected length of stay will be greater than 2 midnights or inpatient only procedure)   Admit to Inpatient (patient's expected length of stay will be greater than 2 midnights or inpatient only procedure)   VAS US  CAROTID    Author: Lavanda Porter, MD 12 pm- 8 pm. [facility]. 10/19/2023 9:04 PM >>Please note for any concern,or critical results after hours past 8pm please contact the [facility] floor coverage provider from 7 PM- 7 AM. For on call review [email or web address], username [email or web address] and PW: your phone number<<

## 2023-10-19 NOTE — Progress Notes (Signed)
 Carotid duplex has been completed.   Results can be found under chart review under CV PROC. 10/19/2023 4:47 PM Kashawn Dirr RVT, RDMS

## 2023-10-20 ENCOUNTER — Inpatient Hospital Stay (HOSPITAL_COMMUNITY)

## 2023-10-20 DIAGNOSIS — I639 Cerebral infarction, unspecified: Secondary | ICD-10-CM

## 2023-10-20 DIAGNOSIS — N1832 Chronic kidney disease, stage 3b: Secondary | ICD-10-CM

## 2023-10-20 DIAGNOSIS — E1122 Type 2 diabetes mellitus with diabetic chronic kidney disease: Secondary | ICD-10-CM

## 2023-10-20 DIAGNOSIS — I4891 Unspecified atrial fibrillation: Secondary | ICD-10-CM | POA: Diagnosis not present

## 2023-10-20 DIAGNOSIS — E162 Hypoglycemia, unspecified: Secondary | ICD-10-CM | POA: Diagnosis not present

## 2023-10-20 DIAGNOSIS — E785 Hyperlipidemia, unspecified: Secondary | ICD-10-CM

## 2023-10-20 DIAGNOSIS — E1151 Type 2 diabetes mellitus with diabetic peripheral angiopathy without gangrene: Secondary | ICD-10-CM

## 2023-10-20 DIAGNOSIS — R55 Syncope and collapse: Secondary | ICD-10-CM | POA: Diagnosis not present

## 2023-10-20 DIAGNOSIS — I129 Hypertensive chronic kidney disease with stage 1 through stage 4 chronic kidney disease, or unspecified chronic kidney disease: Secondary | ICD-10-CM

## 2023-10-20 LAB — GLUCOSE, CAPILLARY
Glucose-Capillary: 98 mg/dL (ref 70–99)
Glucose-Capillary: 93 mg/dL (ref 70–99)
Glucose-Capillary: 161 mg/dL — ABNORMAL HIGH (ref 70–99)
Glucose-Capillary: 224 mg/dL — ABNORMAL HIGH (ref 70–99)
Glucose-Capillary: 127 mg/dL — ABNORMAL HIGH (ref 70–99)

## 2023-10-20 LAB — LIPID PANEL
Cholesterol: 116 mg/dL (ref 0–200)
HDL: 42 mg/dL (ref >40)
LDL Cholesterol: 54 mg/dL (ref 0–99)
Total CHOL/HDL Ratio: 2.8 ratio
Triglycerides: 98 mg/dL (ref <150)
VLDL: 20 mg/dL (ref 0–40)

## 2023-10-20 LAB — BASIC METABOLIC PANEL WITH GFR
Anion gap: 9 (ref 5–15)
BUN: 15 mg/dL (ref 8–23)
CO2: 23 mmol/L (ref 22–32)
Calcium: 8.3 mg/dL — ABNORMAL LOW (ref 8.9–10.3)
Chloride: 103 mmol/L (ref 98–111)
Creatinine, Ser: 1.76 mg/dL — ABNORMAL HIGH (ref 0.61–1.24)
GFR, Estimated: 40 mL/min — ABNORMAL LOW (ref >60)
Glucose, Bld: 177 mg/dL — ABNORMAL HIGH (ref 70–99)
Potassium: 3.6 mmol/L (ref 3.5–5.1)
Sodium: 135 mmol/L (ref 135–145)

## 2023-10-20 MED ORDER — LIVING WELL WITH DIABETES BOOK
Freq: Once | Status: AC
  Filled 2023-10-20: qty 1

## 2023-10-20 MED ORDER — ATORVASTATIN CALCIUM 10 MG PO TABS
20.0000 mg | ORAL_TABLET | Freq: Every day | ORAL | Status: DC
  Administered 2023-10-20 – 2023-10-21 (×2): 20 mg via ORAL
  Filled 2023-10-20 (×2): qty 2

## 2023-10-20 MED ORDER — POTASSIUM CHLORIDE CRYS ER 20 MEQ PO TBCR
40.0000 meq | EXTENDED_RELEASE_TABLET | ORAL | Status: AC
  Administered 2023-10-20 (×2): 40 meq via ORAL
  Filled 2023-10-20 (×2): qty 2

## 2023-10-20 MED ORDER — APIXABAN 5 MG PO TABS
5.0000 mg | ORAL_TABLET | Freq: Two times a day (BID) | ORAL | Status: DC
  Administered 2023-10-20 – 2023-10-21 (×2): 5 mg via ORAL
  Filled 2023-10-20 (×2): qty 1

## 2023-10-20 NOTE — Evaluation (Signed)
 Speech Language Pathology Evaluation Patient Details Name: Cory [name] MRN: [id number] DOB: 08/24/1945 Today's Date: 10/20/2023 Time: 1027-2536 SLP Time Calculation (min) (ACUTE ONLY): 8 min  Problem List:  Patient Active Problem List   Diagnosis Date Noted   Syncope and collapse 10/19/2023   HFrEF (heart failure with reduced ejection fraction) (HCC) 09/25/2023   Iron  deficiency anemia 09/21/2023   Left heart failure (HCC) 09/21/2023   Left ventricular failure (HCC) 09/21/2023   Pressure injury of skin 09/21/2023   Acute encephalopathy 09/18/2023   CKD stage 3b, GFR 30-44 ml/min (HCC) 08/14/2023   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   IDA (iron  deficiency anemia) 08/14/2023   Decubitus ulcer of sacral region, stage 2 (HCC) 08/14/2023   H/O urinary incontinence 08/14/2023   AMS (altered mental status) 05/09/2023   Chronic anticoagulation 05/08/2023   History of prostate cancer 04/26/2023   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   Renal insufficiency 04/26/2023   Cancer of splenic flexure (HCC) 04/19/2023   Syncope 02/16/2023   Atrial flutter with rapid ventricular response (HCC) 12/27/2022   UTI (urinary tract infection) 12/27/2022   Acute metabolic encephalopathy 12/27/2022   Elevated troponin 12/27/2022   Elevated lipase 12/27/2022   Generalized weakness 12/27/2022   Type 2 diabetes mellitus with chronic kidney disease, with long-term current use of insulin  (HCC) 12/26/2022   AKI (acute kidney injury) (HCC) 12/26/2022   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/02/2021   Moderate protein-calorie malnutrition (HCC)    Septic shock (HCC) 11/27/2020   Overweight (BMI 25.0-29.9) 11/27/2020   Hyperlipidemia 11/26/2020   Hyperglycemia 11/26/2020   Hypertension 11/26/2020   Past Medical History:  Past Medical History:  Diagnosis Date   Atrial flutter (HCC)    BPH (benign prostatic hyperplasia)    CKD (chronic kidney  disease), stage III (HCC)    Colon cancer (HCC)    Diabetes mellitus without complication (HCC)    DM (diabetes mellitus), type 2 (HCC)    HLD (hyperlipidemia)    HTN (hypertension)    Hx of right BKA (HCC)    Hyperlipidemia    Hypertension    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 MRI  MRI returned positive showing small acute left parietal infarct and moderate chronic small vessel ischemic disease. BSE 09/2023 recommended Dys 3/thin due to prolonged mastication. Diet texture was upgraded to regular by MD and SLP followed pt and recommended continuing regular/thin.   Assessment / Plan / Recommendation Clinical Impression  Pt's speech is intelligible and may need to repeat himself if not understood given a  slight accent. Language is adequate and follwed 3 step command without difficulty. Able to verbally express himself without difficulty. He was given the memory subtest ot the Cognistat and scored within normal range. Verbal problem solving appropriate. Pt states he has not noticed a change in his speech-language-cognition. No further ST  needed.    SLP Assessment  SLP Recommendation/Assessment: Patient does not need any further Speech Language Pathology Services SLP Visit Diagnosis: Cognitive communication deficit (R41.841)     Assistance Recommended at Discharge  None  Functional Status Assessment Patient has not had a recent decline in their functional status  Frequency and Duration           SLP Evaluation Cognition  Overall Cognitive Status: Within Functional Limits for tasks assessed Arousal/Alertness: Awake/alert Orientation Level: Oriented to situation (did not ask other orientation questions) Attention: Sustained Sustained Attention: Appears intact Memory: Appears intact (scored in average range on Cognistat memory subtest) Awareness: Appears intact Problem Solving: Appears intact Safety/Judgment: Appears intact       Comprehension  Auditory Comprehension Overall Auditory Comprehension: Appears within functional limits for tasks assessed Visual Recognition/Discrimination Discrimination: Not tested Reading Comprehension Reading Status: Not tested    Expression Expression Primary Mode of Expression: Verbal Verbal Expression Overall Verbal Expression: Appears within functional limits for tasks assessed Initiation: No impairment Level of Generative/Spontaneous Verbalization: Conversation Repetition:  (NT) Naming: No impairment Pragmatics: No impairment Written Expression Written Expression: Not tested   Oral / Motor  Oral Motor/Sensory Function Overall Oral Motor/Sensory Function: Within functional limits Motor Speech Overall Motor Speech: Appears within functional limits for tasks assessed Respiration: Within functional limits Phonation: Normal Resonance: Within functional limits Articulation: Within functional limitis Intelligibility: Intelligible Motor Planning: Within functional limits Motor Speech Errors: Not applicable            [name] 10/20/2023, 9:28 AM

## 2023-10-20 NOTE — Inpatient Diabetes Management (Signed)
 Inpatient Diabetes Program Recommendations  AACE/ADA: New Consensus Statement on Inpatient Glycemic Control (2015)  Target Ranges:  Prepandial:   less than 140 mg/dL      Peak postprandial:   less than 180 mg/dL (1-2 hours)      Critically ill patients:  140 - 180 mg/dL   Lab Results  Component Value Date   GLUCAP 161 (H) 10/20/2023   HGBA1C 9.6 (H) 10/19/2023    Attempted to speak with patient regarding DM management and A1C of 9.6%.  He tells me he does not want to be disturbed at this time.  Ordered the [facility] booklet.    Thank you, Hays Lipschutz, MSN, CDCES Diabetes Coordinator Inpatient Diabetes Program [phone] (team pager from 8a-5p)

## 2023-10-20 NOTE — Hospital Course (Addendum)
 78 yo M with 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  Had recently been admitted for lethargy, found to have DKA.  Discharged to SNF.  Was preparing to discharge from SNF when he was found unresponsive, transported to ER where he was hypoglycemic and hypotensive.  MRI brain obtained that showed acute infarct.  Neurology consulted.

## 2023-10-20 NOTE — Progress Notes (Signed)
 STROKE TEAM PROGRESS NOTE   SUBJECTIVE (INTERVAL HISTORY) No family is at the bedside.  Overall his condition is completely resolved. He is sitting in chair for lunch, he had good appetite. No neuro deficit. He had RLE BKA. No complaint.    OBJECTIVE Temp:  [97.7 F (36.5 C)-98.6 F (37 C)] 98.6 F (37 C) (06/13 1623) Pulse Rate:  [66-71] 67 (06/13 1623) Cardiac Rhythm: Atrial flutter;Bundle branch block (06/13 0800) Resp:  [16-21] 16 (06/13 1623) BP: (117-165)/(64-122) 154/89 (06/13 1623) SpO2:  [99 %-100 %] 99 % (06/13 1623) Weight:  [85.2 kg-85.6 kg] 85.6 kg (06/13 0514)  Recent Labs  Lab 10/19/23 2128 10/20/23 0522 10/20/23 0836 10/20/23 1201 10/20/23 1621  GLUCAP 211* 98 93 161* 224*   Recent Labs  Lab 10/19/23 0910  NA 137  K 2.8*  CL 104  CO2 26  GLUCOSE 165*  BUN 14  CREATININE 1.95*  CALCIUM  8.1*   Recent Labs  Lab 10/19/23 0910  AST 17  ALT 13  ALKPHOS 63  BILITOT 0.5  PROT 6.3*  ALBUMIN 2.6*   Recent Labs  Lab 10/19/23 0910  WBC 5.1  NEUTROABS 3.7  HGB 11.3*  HCT 34.8*  MCV 80.9  PLT 146*   Recent Labs  Lab 10/19/23 1244  CKTOTAL 137   No results for input(s): LABPROT, INR in the last 72 hours. Recent Labs    10/19/23 0841  COLORURINE YELLOW  LABSPEC 1.005  PHURINE 7.0  GLUCOSEU NEGATIVE  HGBUR NEGATIVE  BILIRUBINUR NEGATIVE  KETONESUR NEGATIVE  PROTEINUR 100*  NITRITE NEGATIVE  LEUKOCYTESUR TRACE*       Component Value Date/Time   CHOL 116 10/20/2023 0500   CHOL 151 08/28/2023 1438   TRIG 98 10/20/2023 0500   HDL 42 10/20/2023 0500   HDL 55 08/28/2023 1438   CHOLHDL 2.8 10/20/2023 0500   VLDL 20 10/20/2023 0500   LDLCALC 54 10/20/2023 0500   LDLCALC 65 08/28/2023 1438   Lab Results  Component Value Date   HGBA1C 9.6 (H) 10/19/2023      Component Value Date/Time   LABOPIA NONE DETECTED 09/18/2023 1406   COCAINSCRNUR NONE DETECTED 09/18/2023 1406   COCAINSCRNUR Negative 05/10/2023 1145   LABBENZ NONE  DETECTED 09/18/2023 1406   AMPHETMU NONE DETECTED 09/18/2023 1406   THCU NONE DETECTED 09/18/2023 1406   LABBARB NONE DETECTED 09/18/2023 1406    Recent Labs  Lab 10/19/23 2020  ETH <15    I have personally reviewed the radiological images below and agree with the radiology interpretations.  MR ANGIO HEAD WO CONTRAST Result Date: 10/20/2023 CLINICAL DATA:  Follow-up examination for stroke. EXAM: MRA HEAD WITHOUT CONTRAST TECHNIQUE: Angiographic images of the Circle of Willis were acquired using MRA technique without intravenous contrast. COMPARISON:  Prior brain MRI from 10/19/23 FINDINGS: Anterior circulation: Both internal carotid arteries are patent to the termini without stenosis or other abnormality. A1 segments, anterior communicating artery complex, and anterior cerebral arteries widely patent. No M1 stenosis or occlusion. Distal MCA branches perfused and symmetric. Posterior circulation: Both V4 segments patent without stenosis. Right vertebral artery dominant. Right PICA patent. Left PICA not seen. Basilar patent without stenosis. Superior cerebral arteries patent bilaterally. Right PCA supplied via the basilar. Fetal type origin of the left PCA. Both PCAs patent without stenosis. Anatomic variants: Fetal type left PCA. Dominant right vertebral artery. Other: No intracranial aneurysm. IMPRESSION: Normal intracranial MRA. Electronically Signed   By: Virgia Griffins M.D.   On: 10/20/2023 01:10   DG  Chest 1 View Result Date: 10/19/2023 CLINICAL DATA:  Altered mental status EXAM: CHEST  1 VIEW COMPARISON:  09/20/2023 FINDINGS: Heart and mediastinal contours are within normal limits. No focal opacities or effusions. No acute bony abnormality. IMPRESSION: No active disease. Electronically Signed   By: Janeece Mechanic M.D.   On: 10/19/2023 20:32   MR BRAIN WO CONTRAST Result Date: 10/19/2023 CLINICAL DATA:  Mental status change, unknown cause. Syncope/presyncope, cerebrovascular cause  suspected. EXAM: MRI HEAD WITHOUT CONTRAST TECHNIQUE: Multiplanar, multiecho pulse sequences of the brain and surrounding structures were obtained without intravenous contrast. COMPARISON:  Head CT 10/19/2023 and MRI 05/09/2023 FINDINGS: Brain: There are several subcentimeter acute infarcts involving white matter and cortex in the left parietal lobe. A chronic subcortical infarct is again noted anteriorly in the right frontal lobe, and there is also an unchanged tiny chronic cortical infarct in the left superior frontal gyrus. Patchy to confluent T2 hyperintensities in the cerebral white matter bilaterally are similar to the prior MRI and are nonspecific but compatible with moderate chronic small vessel ischemic disease. There are chronic lacunar infarcts in the thalami and cerebellum. There is moderate cerebral atrophy. Chronic microhemorrhages are present in the the deep gray nuclei as well as more peripheral portions of both cerebral hemispheres have increased in number from the prior MRI and are nonspecific but may be related to chronic hypertension and chronic ischemia. No mass, midline shift, or extra-axial fluid collection is identified. Vascular: Major intracranial vascular flow voids are preserved. Skull and upper cervical spine: No suspicious marrow lesion. 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 Minimal mucosal thickening in the paranasal sinuses. No significant mastoid fluid. Other: None. IMPRESSION: 1. Small acute left parietal infarcts. 2. Moderate chronic small vessel ischemic disease. Electronically Signed   By: Aundra Lee M.D.   On: 10/19/2023 16:51   VAS US  CAROTID Result Date: 10/19/2023 Carotid Arterial Duplex Study Patient Name:  Cory [name]  Date of Exam:   10/19/2023 Medical Rec #: [id number]     Accession #:    [id number] Date of Birth: 03-Sep-1945     Patient Gender: M Patient Age:   78 years Exam Location:  [facility] Procedure:      VAS US  CAROTID Referring Phys: EKTA PATEL --------------------------------------------------------------------------------  Indications:      Syncope. Risk Factors:     Hypertension, hyperlipidemia, Diabetes, past history of                   smoking. Other Factors:    CKD, Afib, CHF,. Comparison Study: Previous exam on 02/18/2023 was WNL Performing Technologist: Arlyce Berger RVT, RDMS  Examination Guidelines: A complete evaluation includes B-mode imaging, spectral Doppler, color Doppler, and power Doppler as needed of all accessible portions of each vessel. Bilateral testing is considered an integral part of a complete examination. Limited examinations for reoccurring indications may be performed as noted.  Right Carotid Findings: +----------+--------+--------+--------+------------------+--------+           PSV cm/sEDV cm/sStenosisPlaque DescriptionComments +----------+--------+--------+--------+------------------+--------+ CCA Prox  60      11                                         +----------+--------+--------+--------+------------------+--------+ CCA Distal37      6                                          +----------+--------+--------+--------+------------------+--------+  ICA Prox  31      9                                          +----------+--------+--------+--------+------------------+--------+ ICA Distal33      8                                          +----------+--------+--------+--------+------------------+--------+ ECA       51      0                                          +----------+--------+--------+--------+------------------+--------+ +----------+--------+-------+----------------+-------------------+           PSV cm/sEDV cmsDescribe        Arm Pressure (mmHG) +----------+--------+-------+----------------+-------------------+ Subclavian70             Multiphasic, WNL                     +----------+--------+-------+----------------+-------------------+ +---------+--------+--+--------+-+---------+ VertebralPSV cm/s29EDV cm/s9Antegrade +---------+--------+--+--------+-+---------+  Left Carotid Findings: +----------+--------+--------+--------+------------------+--------+           PSV cm/sEDV cm/sStenosisPlaque DescriptionComments +----------+--------+--------+--------+------------------+--------+ CCA Prox  80      12                                         +----------+--------+--------+--------+------------------+--------+ CCA Distal47      10                                         +----------+--------+--------+--------+------------------+--------+ ICA Prox  36      10                                         +----------+--------+--------+--------+------------------+--------+ ICA Distal40      13                                         +----------+--------+--------+--------+------------------+--------+ ECA       43      0                                          +----------+--------+--------+--------+------------------+--------+ +----------+--------+--------+----------------+-------------------+           PSV cm/sEDV cm/sDescribe        Arm Pressure (mmHG) +----------+--------+--------+----------------+-------------------+ [id number]             Multiphasic, WNL                    +----------+--------+--------+----------------+-------------------+ +---------+--------+--+--------+-+---------+ VertebralPSV cm/s19EDV cm/s4Antegrade +---------+--------+--+--------+-+---------+   Summary: Right Carotid: The extracranial vessels were near-normal with only minimal wall                thickening or plaque. Left Carotid: The  extracranial vessels were near-normal with only minimal wall               thickening or plaque. Vertebrals:  Bilateral vertebral arteries demonstrate antegrade flow. Subclavians: Normal flow hemodynamics were seen in  bilateral subclavian              arteries. *See table(s) above for measurements and observations.     Preliminary    CT Head Wo Contrast Result Date: 10/19/2023 CLINICAL DATA:  Head trauma, minor (Age >= 65y). EXAM: CT HEAD WITHOUT CONTRAST TECHNIQUE: Contiguous axial images were obtained from the base of the skull through the vertex without intravenous contrast. RADIATION DOSE REDUCTION: This exam was performed according to the departmental dose-optimization program which includes automated exposure control, adjustment of the mA and/or kV according to patient size and/or use of iterative reconstruction technique. COMPARISON:  Head CT and MRI 05/09/2023 FINDINGS: Brain: There is no evidence of an acute infarct, intracranial hemorrhage, mass, midline shift, or extra-axial fluid collection. There is moderate cerebral atrophy. Patchy hypodensities in the cerebral white matter bilaterally are unchanged and nonspecific but compatible with moderate chronic small vessel ischemic disease. 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 Vascular: Calcified atherosclerosis at the skull base. No hyperdense vessel. Skull: No fracture or suspicious lesion. Sinuses/Orbits: Mild mucosal thickening in the ethmoid sinuses. No significant mastoid fluid. Bilateral cataract extraction. Other: None. IMPRESSION: 1. No evidence of acute intracranial abnormality. 2. Moderate chronic small vessel ischemic disease. Electronically Signed   By: Aundra Lee M.D.   On: 10/19/2023 10:11     PHYSICAL EXAM  Temp:  [97.7 F (36.5 C)-98.6 F (37 C)] 98.6 F (37 C) (06/13 1623) Pulse Rate:  [66-71] 67 (06/13 1623) Resp:  [16-21] 16 (06/13 1623) BP: (117-165)/(64-122) 154/89 (06/13 1623) SpO2:  [99 %-100 %] 99 % (06/13 1623) Weight:  [85.2 kg-85.6 kg] 85.6 kg (06/13 0514)  General - Well nourished, well developed, in no apparent distress.  Ophthalmologic - fundi not visualized due to  noncooperation.  Cardiovascular - irregularly irregular heart rate and rhythm.  Mental Status -  Level of arousal and orientation to time, place, and person were intact. Language including expression, naming, repetition, comprehension was assessed and found intact. Fund of Knowledge was assessed and was intact.  Cranial Nerves II - XII - II - Visual field intact OU. III, IV, VI - Extraocular movements intact. V - Facial sensation intact bilaterally. VII - Facial movement intact bilaterally. VIII - Hearing & vestibular intact bilaterally. X - Palate elevates symmetrically. XI - Chin turning & shoulder shrug intact bilaterally. XII - Tongue protrusion intact.  Motor Strength - The patient's strength was normal in all extremities and pronator drift was absent except RLE BKA.  Bulk was normal and fasciculations were absent.   Motor Tone - Muscle tone was assessed at the neck and appendages and was normal.  Reflexes - The patient's reflexes were symmetrical in all extremities and he had no pathological reflexes.  Sensory - Light touch, temperature/pinprick were assessed and were symmetrical.    Coordination - The patient had normal movements in the hands with no ataxia or dysmetria.  Tremor was absent.  Gait and Station - deferred.   ASSESSMENT/PLAN Mr. Cory [name] is a 78 y.o. male with history of hypertension, hyperlipidemia, diabetes, CKD 3, colon cancer, A-fib on Eliquis , seizure in the setting of hyperglycemia, DKA right lower extremity 2022 due to osteomyelitis admitted for altered mental status, lethargy and  unresponsiveness.  Found to have hypothermia and hypoglycemia, glucose 30s, received dextrose  injection and symptoms improved.  No TNK given due to symptom improving.  Diabetes, uncontrolled Hyperglycemia With EMS glucose 30s Symptoms much resolved after dextrose  HgbA1c 9.6 goal < 7.0 Controlled CBG monitoring SSI DM education and close PCP follow up  Stroke,  incidental finding: 3 small left periventricular infarct, likely small vessel disease in the setting of hypoglycemia CT no acute abnormality MRI brain small acute left parietal infarcts MRA normal Carotid Doppler unremarkable 2D Echo EF 25 to 30% in 09/2023 LDL 54 HgbA1c 9.6 Eliquis  for VTE prophylaxis Eliquis  5 mg twice daily prior to admission, now on home Eliquis  5 mg twice daily.  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 Please call with questions. Pt will follow up with stroke clinic NP at [facility] in about 4 weeks. Thanks for the consult.   Consuelo Denmark, MD PhD Stroke Neurology 10/20/2023 4:51 PM    To contact Stroke Continuity provider, please refer to [email or web address]. After hours, contact General Neurology

## 2023-10-20 NOTE — Evaluation (Signed)
 Clinical/Bedside Swallow Evaluation Patient Details  Name: Cory [name] MRN: [id number] Date of Birth: 19-Jan-1946  Today's Date: 10/20/2023 Time: SLP Start Time (ACUTE ONLY): 0857 SLP Stop Time (ACUTE ONLY): 0908 SLP Time Calculation (min) (ACUTE ONLY): 11 min  Past Medical History:  Past Medical History:  Diagnosis Date   Atrial flutter (HCC)    BPH (benign prostatic hyperplasia)    CKD (chronic kidney disease), stage III (HCC)    Colon cancer (HCC)    Diabetes mellitus without complication (HCC)    DM (diabetes mellitus), type 2 (HCC)    HLD (hyperlipidemia)    HTN (hypertension)    Hx of right BKA (HCC)    Hyperlipidemia    Hypertension    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 MRI  MRI returned positive showing small acute left parietal infarct and moderate chronic small vessel ischemic disease. BSE 09/2023 recommended Dys 3/thin due to prolonged mastication. Diet texture was upgraded to regular by MD and SLP followed pt and recommended continuing regular/thin.    Assessment / Plan / Recommendation  Clinical Impression  Pt  demonstrated improvements in swallow, alertness and awareness compared to prior assessment last month. His vocal quality is clear, strong cough and no oromotor deficits. He denies recent dysphagia. Orally he masticated, transited regular texture timely without residue. Consecutive sips water via straw consumed without s/s aspiration over multiple trials. Recommend initiate regular texture, thin liquids, pills with thin and no further ST needed. SLP Visit Diagnosis: Dysphagia, unspecified (R13.10)    Aspiration Risk  No limitations    Diet Recommendation Regular;Thin liquid    Liquid Administration via: Cup;Straw Medication Administration: Whole meds with puree Supervision: Patient able to self feed Compensations: Slow rate;Small sips/bites Postural Changes: Seated upright at 90 degrees    Other  Recommendations Oral Care Recommendations: Oral care BID     Assistance Recommended at Discharge    Functional Status Assessment    Frequency and Duration            Prognosis        Swallow Study   General Date of Onset: 10/20/23 HPI: Cory [name] is a 78 y.o. 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 MRI  MRI returned positive showing small acute left parietal infarct and moderate chronic small vessel ischemic disease. BSE 09/2023 recommended Dys 3/thin due to prolonged mastication.  Diet texture was upgraded to regular by MD and SLP followed pt and recommended continuing regular/thin. Type of Study: Bedside Swallow Evaluation Previous Swallow Assessment:  (see HPI) Diet Prior to this Study: NPO Temperature Spikes Noted: No Respiratory Status: Room air History of Recent Intubation: No Behavior/Cognition:  Alert;Cooperative;Pleasant mood Oral Cavity Assessment: Within Functional Limits Oral Care Completed by SLP: No Oral Cavity - Dentition: Missing dentition (missing several posterior) Vision: Functional for self-feeding Self-Feeding Abilities: Able to feed self Patient Positioning: Upright in bed Baseline Vocal Quality: Normal Volitional Cough: Strong Volitional Swallow: Able to elicit    Oral/Motor/Sensory Function Overall Oral Motor/Sensory Function: Within functional limits   Ice Chips Ice chips: Not tested   Thin Liquid Thin Liquid: Within functional limits Presentation: Cup;Straw    Nectar Thick Nectar Thick Liquid: Not tested   Honey Thick Honey Thick Liquid: Not tested   Puree Puree: Within functional limits   Solid     Solid: Within functional limits      Cory [name] 10/20/2023,9:19 AM

## 2023-10-20 NOTE — TOC CAGE-AID Note (Signed)
 Transition of Care [facility]) - CAGE-AID Screening   Patient Details  Name: Cory [name] MRN: [id number] Date of Birth: 12/14/1945  [facility]) CM/SW Contact:    [name], LCSW Phone Number: 10/20/2023, 9:13 AM   Clinical Narrative:    CAGE-AID Screening:    Have You Ever Felt You Ought to Cut Down on Your Drinking or Drug Use?: No Have People Annoyed You By [employer] Your Drinking Or Drug Use?: No Have You Felt Bad Or Guilty About Your Drinking Or Drug Use?: No Have You Ever Had a Drink or Used Drugs First Thing In The Morning to Steady Your Nerves or to Get Rid of a Hangover?: No CAGE-AID Score: 0  Substance Abuse Education Offered: No

## 2023-10-20 NOTE — Plan of Care (Signed)
  Problem: Education: Goal: Knowledge of condition and prescribed therapy will improve Outcome: Progressing   Problem: Cardiac: Goal: Will achieve and/or maintain adequate cardiac output Outcome: Progressing   Problem: Physical Regulation: Goal: Complications related to the disease process, condition or treatment will be avoided or minimized Outcome: Progressing   Problem: Education: Goal: Knowledge of General Education information will improve Description: Including pain rating scale, medication(s)/side effects and non-pharmacologic comfort measures Outcome: Progressing   Problem: Health Behavior/Discharge Planning: Goal: Ability to manage health-related needs will improve Outcome: Progressing   Problem: Clinical Measurements: Goal: Ability to maintain clinical measurements within normal limits will improve Outcome: Progressing Goal: Will remain free from infection Outcome: Progressing Goal: Diagnostic test results will improve Outcome: Progressing Goal: Respiratory complications will improve Outcome: Progressing Goal: Cardiovascular complication will be avoided Outcome: Progressing   Problem: Activity: Goal: Risk for activity intolerance will decrease Outcome: Progressing   Problem: Nutrition: Goal: Adequate nutrition will be maintained Outcome: Progressing   Problem: Coping: Goal: Level of anxiety will decrease Outcome: Progressing   Problem: Elimination: Goal: Will not experience complications related to bowel motility Outcome: Progressing Goal: Will not experience complications related to urinary retention Outcome: Progressing   Problem: Pain Managment: Goal: General experience of comfort will improve and/or be controlled Outcome: Progressing   Problem: Safety: Goal: Ability to remain free from injury will improve Outcome: Progressing   Problem: Skin Integrity: Goal: Risk for impaired skin integrity will decrease Outcome: Progressing   Problem:  Education: Goal: Ability to describe self-care measures that may prevent or decrease complications (Diabetes Survival Skills Education) will improve Outcome: Progressing Goal: Individualized Educational Video(s) Outcome: Progressing   Problem: Coping: Goal: Ability to adjust to condition or change in health will improve Outcome: Progressing   Problem: Fluid Volume: Goal: Ability to maintain a balanced intake and output will improve Outcome: Progressing   Problem: Health Behavior/Discharge Planning: Goal: Ability to identify and utilize available resources and services will improve Outcome: Progressing Goal: Ability to manage health-related needs will improve Outcome: Progressing   Problem: Metabolic: Goal: Ability to maintain appropriate glucose levels will improve Outcome: Progressing   Problem: Nutritional: Goal: Maintenance of adequate nutrition will improve Outcome: Progressing Goal: Progress toward achieving an optimal weight will improve Outcome: Progressing   Problem: Skin Integrity: Goal: Risk for impaired skin integrity will decrease Outcome: Progressing   Problem: Tissue Perfusion: Goal: Adequacy of tissue perfusion will improve Outcome: Progressing   Problem: Education: Goal: Knowledge of disease or condition will improve Outcome: Progressing Goal: Knowledge of secondary prevention will improve (MUST DOCUMENT ALL) Outcome: Progressing Goal: Knowledge of patient specific risk factors will improve (DELETE if not current risk factor) Outcome: Progressing   Problem: Ischemic Stroke/TIA Tissue Perfusion: Goal: Complications of ischemic stroke/TIA will be minimized Outcome: Progressing   Problem: Coping: Goal: Will verbalize positive feelings about self Outcome: Progressing Goal: Will identify appropriate support needs Outcome: Progressing   Problem: Health Behavior/Discharge Planning: Goal: Ability to manage health-related needs will improve Outcome:  Progressing Goal: Goals will be collaboratively established with patient/family Outcome: Progressing   Problem: Self-Care: Goal: Ability to participate in self-care as condition permits will improve Outcome: Progressing Goal: Verbalization of feelings and concerns over difficulty with self-care will improve Outcome: Progressing Goal: Ability to communicate needs accurately will improve Outcome: Progressing   Problem: Nutrition: Goal: Risk of aspiration will decrease Outcome: Progressing Goal: Dietary intake will improve Outcome: Progressing

## 2023-10-20 NOTE — TOC Initial Note (Addendum)
 Transition of Care [facility]) - Initial/Assessment Note    Patient Details  Name: Cory [name] MRN: [id number] Date of Birth: [birth year]  [facility] CM/SW Contact:    Carmon Christen, LCSWA Phone Number: 10/20/2023, 1:43 PM  Clinical Narrative:                  CSW received consult for possible SNF placement at time of discharge. CSW spoke with patient and patients niece [name] regarding PT recommendation of SNF placement at time of discharge. Patient reports PTA he comes from [facility] short term.Patient expressed understanding of PT recommendation patient request for CSW to check with [facility] to see how many rehab days he has left or if he is in copay days before making final decision. Patients niece [name] informed CSW she is okay with whatever patient decides in regards to SNF vrs. Going home.CSW Lvm for Star with [facility]. CSW awaiting call back.  Update- Star with [facility] informed CSW that patient has used 22 of rehab days and is in copay days. Copay a day is 214 and facility would need 14 days of pay upfront. CSW informed patient. Patient has decided that he would like to return home when  medically ready for dc. All questions answered. No further questions reported at this time.   Update- CSW followed up with patients niece [name]. [name] is going to come to hospital this evening and speak with him in regards to dc plan. CSW tomorrow to follow up with patient and [name] patients niece tomorrow to confirm dc plan, SNF vrs. Home.  Update- [name] patients niece would like for patient to return home with hh services through [facility] with RN,PT,OT,and Aide. Patient also in agreement.Orders requested to MD. Patients niece [name] informed CSW that she can pick patient up tomorrow if medically ready for dc.       Patient Goals and CMS Choice            Expected Discharge Plan and Services                                              Prior Living  Arrangements/Services                       Activities of Daily Living   ADL Screening (condition at time of admission) Independently performs ADLs?: Yes (appropriate for developmental age) Is the patient deaf or have difficulty hearing?: No Does the patient have difficulty seeing, even when wearing glasses/contacts?: No Does the patient have difficulty concentrating, remembering, or making decisions?: No  Permission Sought/Granted                  Emotional Assessment              Admission diagnosis:  Syncope and collapse [R55] Hypokalemia [E87.6] Hypoglycemia [E16.2] Hypothermia, initial encounter [T68.XXXA] Patient Active Problem List   Diagnosis Date Noted   Syncope and collapse 10/19/2023   HFrEF (heart failure with reduced ejection fraction) (HCC) 09/25/2023   Iron  deficiency anemia 09/21/2023   Left heart failure (HCC) 09/21/2023   Left ventricular failure (HCC) 09/21/2023   Pressure injury of skin 09/21/2023   Acute encephalopathy 09/18/2023   CKD stage 3b, GFR 30-44 ml/min (HCC) 08/14/2023   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   IDA (iron  deficiency anemia) 08/14/2023  Decubitus ulcer of sacral region, stage 2 (HCC) 08/14/2023   H/O urinary incontinence 08/14/2023   AMS (altered mental status) 05/09/2023   Chronic anticoagulation 05/08/2023   History of prostate cancer 04/26/2023   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   Renal insufficiency 04/26/2023   Cancer of splenic flexure (HCC) 04/19/2023   Syncope 02/16/2023   Atrial flutter with rapid ventricular response (HCC) 12/27/2022   UTI (urinary tract infection) 12/27/2022   Acute metabolic encephalopathy 12/27/2022   Elevated troponin 12/27/2022   Elevated lipase 12/27/2022   Generalized weakness 12/27/2022   Type 2 diabetes mellitus with chronic kidney disease, with long-term current use of insulin  (HCC) 12/26/2022   AKI (acute kidney injury)  (HCC) 12/26/2022   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/02/2021   Moderate protein-calorie malnutrition (HCC)    Septic shock (HCC) 11/27/2020   Overweight (BMI 25.0-29.9) 11/27/2020   Hyperlipidemia 11/26/2020   Hyperglycemia 11/26/2020   Hypertension 11/26/2020   PCP:  Janese Medicine., MD Pharmacy:   [facility] [phone] - [place], [place] - [address] [address] Phone: [phone] Fax: [phone]     Social Drivers of Health (SDOH) Social History: SDOH Screenings   Food Insecurity: No Food Insecurity (10/19/2023)  Housing: Low Risk  (10/19/2023)  Transportation Needs: No Transportation Needs (10/19/2023)  Utilities: Patient Unable To Answer (10/19/2023)  Depression (PHQ2-9): Low Risk  (10/09/2023)  Social Connections: Moderately Isolated (10/19/2023)  Tobacco Use: Medium Risk (10/19/2023)   SDOH Interventions:     Readmission Risk Interventions     No data to display

## 2023-10-20 NOTE — Evaluation (Signed)
 Occupational Therapy Evaluation Patient Details Name: Cory [name] MRN: [id number] DOB: 02-Nov-1945 Today's Date: 10/20/2023   History of Present Illness   Pt is 78 year old presented to ED 6/12 for lethargy and unresponsiveness, hypothermia and hypoglycemia.HR on telemetry in A-flutter encephalopathy likely due to hypoglycemia MRI showing small acute left parietal infarct and moderate chronic small vessel ischemic disease.MWU:[id number] mellitus type 2, chronic kidney disease stage III, colon cancer in the splenic flexure, atrial flutter on Eliquis , previous history of seizures secondary to hyperglycemia, 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 Pt lives alone, has inconsistent assistance at home, PLOF mod I with prosthetic, has aide 1X/wk for cleaning. Pt currently at set up/CGA level for ADLs and mobility with RW without prosthetic, able to ambulate around room 25 feet, stood up for 10 minutes while on phone with SW for DC plan with RW for support. Pt overall doing well, but due to lack of support at home and recent SNF placement, would benefit from return to postacute rehab <3hrs/day to return to mod I level prior to return home. If Pt refuses, HHOT recommended. Pt has all DME needed at home needed once at mod I level. Will continue to see acutely to progress as able.      If plan is discharge home, recommend the following:   A little help with walking and/or transfers;A little help with bathing/dressing/bathroom;Assistance with cooking/housework;Assist for transportation;Help with stairs or ramp for entrance     Functional Status Assessment   Patient has had a recent decline in their functional status and demonstrates the ability to make significant improvements in function in a reasonable and predictable amount of time.     Equipment Recommendations   None recommended by OT     Recommendations for Other  Services         Precautions/Restrictions   Precautions Precautions: Fall Recall of Precautions/Restrictions: Intact Required Braces or Orthoses: Other Brace Other Brace: RLE prosthetic Restrictions Weight Bearing Restrictions Per Provider Order: No     Mobility Bed Mobility               General bed mobility comments: in recliner    Transfers Overall transfer level: Needs assistance Equipment used: Rolling walker (2 wheels) Transfers: Sit to/from Stand, Bed to chair/wheelchair/BSC Sit to Stand: Contact guard assist     Step pivot transfers: Contact guard assist     General transfer comment: CGA for safety      Balance Overall balance assessment: Needs assistance Sitting-balance support: No upper extremity supported, Feet supported Sitting balance-Leahy Scale: Fair     Standing balance support: Single extremity supported, During functional activity Standing balance-Leahy Scale: Poor Standing balance comment: able to stand with 1 hand supported by RW, but not able to tolerate challenge                           ADL either performed or assessed with clinical judgement   ADL Overall ADL's : Needs assistance/impaired Eating/Feeding: Independent   Grooming: Contact guard assist;Sitting;Standing   Upper Body Bathing: Set up;Sitting   Lower Body Bathing: Set up;With adaptive equipment;Sitting/lateral leans   Upper Body Dressing : Set up;Sitting   Lower Body Dressing: Set up;With adaptive equipment;Sitting/lateral leans;Sit to/from stand   Toilet Transfer: Contact guard assist;Rolling walker (2 wheels)   Toileting- Clothing Manipulation and Hygiene: Set up;Sitting/lateral lean;Sit to/from stand  Functional mobility during ADLs: Contact guard assist;Rolling walker (2 wheels) General ADL Comments: Pt doing well, set up/CGA for ADLs, uses adaptive equipement for L sock/shoe at baseline, able to stand with RW with 1 hand supported without  LOB.     Vision Baseline Vision/History: 1 Wears glasses Ability to See in Adequate Light: 0 Adequate       Perception         Praxis         Pertinent Vitals/Pain Pain Assessment Pain Assessment: No/denies pain     Extremity/Trunk Assessment Upper Extremity Assessment Upper Extremity Assessment: Overall WFL for tasks assessed;RUE deficits/detail;LUE deficits/detail RUE Deficits / Details: B hand numbness, mild, but occasionally gets worse, B hand stiffness but overall functional RUE Sensation: decreased light touch RUE Coordination: WNL LUE Deficits / Details: B hand numbness, mild, but occasionally gets worse, B hand stiffness but overall functional LUE Sensation: decreased light touch LUE Coordination: WNL   Lower Extremity Assessment Lower Extremity Assessment: Defer to PT evaluation       Communication Communication Communication: Impaired Factors Affecting Communication: Hearing impaired   Cognition Arousal: Alert Behavior During Therapy: WFL for tasks assessed/performed Cognition: No apparent impairments             OT - Cognition Comments: A/ox4, pleasant and conversational, no apparent impairments                 Following commands: Intact       Cueing  General Comments   Cueing Techniques: Verbal cues      Exercises     Shoulder Instructions      Home Living Family/patient expects to be discharged to:: Private residence Living Arrangements: Alone Available Help at Discharge: Personal care attendant;Family;Available PRN/intermittently Type of Home: Apartment Home Access: Level entry     Home Layout: One level     Bathroom Shower/Tub: Chief Strategy Officer: Standard Bathroom Accessibility: Yes How Accessible: Accessible via wheelchair Home Equipment: Rollator (4 wheels);Wheelchair - manual;Tub bench;BSC/3in1;Grab bars - tub/shower;Hand held shower head   Additional Comments: aide comes 1x/wk does laundry and  cleans, niece drives to grocery store and dr's appointments      Prior Functioning/Environment Prior Level of Function : Independent/Modified Independent             Mobility Comments: uses prosthetic most of time but occasionally mobilizes with w/c. Apartment is accessible. ADLs Comments: niece assists with finanaces and transportation and occasionally provides meals    OT Problem List: Impaired balance (sitting and/or standing);Decreased cognition;Decreased safety awareness;Decreased knowledge of use of DME or AE   OT Treatment/Interventions: Self-care/ADL training;DME and/or AE instruction;Therapeutic activities;Balance training      OT Goals(Current goals can be found in the care plan section)   Acute Rehab OT Goals Patient Stated Goal: to improve independence with ADLs, return home OT Goal Formulation: With patient Time For Goal Achievement: 11/03/23 Potential to Achieve Goals: Good   OT Frequency:  Min 2X/week    Co-evaluation              AM-PAC OT 6 Clicks Daily Activity     Outcome Measure Help from another person eating meals?: None Help from another person taking care of personal grooming?: A Little Help from another person toileting, which includes using toliet, bedpan, or urinal?: A Little Help from another person bathing (including washing, rinsing, drying)?: A Little Help from another person to put on and taking off regular upper body clothing?: A Little Help from  another person to put on and taking off regular lower body clothing?: A Little 6 Click Score: 19   End of Session Equipment Utilized During Treatment: Gait belt;Rolling walker (2 wheels) Nurse Communication: Mobility status  Activity Tolerance: Patient tolerated treatment well Patient left: in chair;with call bell/phone within reach;with chair alarm set  OT Visit Diagnosis: Unsteadiness on feet (R26.81);Other abnormalities of gait and mobility (R26.89);Other symptoms and signs  involving cognitive function                Time: 3664-4034 OT Time Calculation (min): 33 min Charges:  OT General Charges $OT Visit: 1 Visit OT Evaluation $OT Eval Low Complexity: 1 Low OT Treatments $Self Care/Home Management : 8-22 mins  [address] OTR/L   Scherry Curtis 10/20/2023, 2:02 PM

## 2023-10-20 NOTE — Progress Notes (Signed)
  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  Had recently been admitted for lethargy, found to have DKA.  Discharged to SNF.  Was preparing to discharge from SNF when he was found unresponsive, transported to ER where he was hypoglycemic and hypotensive.  MRI brain obtained that showed acute infarct.  Neurology consulted.     Assessment and Plan: Acute metabolic encephalopathy due to hypoglycemia Resolved and normal - Low-dose sliding scale only - Hold Lantus , Trulicity   Acute stroke -Permissive hypertension - Hold aspirin and Eliquis  per neurology - Obtain MRA head - Consult neurology - Resume home Lipitor  Atrial flutter Rate controlled - Hold carvedilol  for now - Hold Eliquis  until cleared by neurology  Diabetes Glucose low on admission, relatively low here - Hold Trulicity , glargine - Sliding scale corrections only  Hypertension Blood pressure low normal off meds - Hold amlodipine , carvedilol , BiDil , torsemide , telmisartan   CKD stage IIIb Cr stable relative to baseline  Anemia of chronic kidney disease Thrombocytopenia Stable  Hypokalemia - Supplement potassium       Subjective: Patient feels okay, no focal weakness, numbness, confusion.     Physical Exam: BP (!) 129/98 (BP Location: Left Arm)   Pulse 67   Temp 98 F (36.7 C) (Oral)   Resp 16   Ht 5' 11 (1.803 m)   Wt 85.6 kg   SpO2 99%   BMI 26.32 kg/m   Adult male, lying in bed, interactive and appropriate RRR, no murmurs, no peripheral edema Respiratory normal, lungs clear without rales or wheezes Face symmetric, speech fluent, oriented x 3, recall seems intact Cranial nerves normal Upper extremity  strength 5/5 and symmetric, lower extremity strength 5/5 and symmetric, some mild subjective weakness of the ankle dorsiflexion, and unsteadiness with standing with physical therapy    Data Reviewed: BMP shows hypokalemia, stable Ckd CBC shows mild anemia MRI brain shows acute stroke, left parietal lobe MRA head shows no significant disease Carotid US  normal  Family Communication:     Disposition: Status is: Inpatient         Author: Ephriam Hashimoto, MD 10/20/2023 3:00 PM  For on call review [email or web address].

## 2023-10-20 NOTE — Telephone Encounter (Signed)
   Patient Name: Cory [name]  DOB: 1945-05-24 MRN: [id number]  Primary Cardiologist: Arun K Thukkani, MD  Chart reviewed as part of pre-operative protocol coverage. Patient was seen by Charles Connor NP on 10/12/23. Per his office note, The patient affirms he has been doing well without any new cardiac symptoms. They are able to achieve 4 METS without cardiac limitations. Therefore, based on ACC/AHA guidelines, the patient would be at acceptable risk for the planned procedure without further cardiovascular testing. The patient was advised that if he develops new symptoms prior to surgery to contact our office to arrange for a follow-up visit, and he verbalized understanding.  Eliquis  recommendations were discussed with PharmD. Per office protocol, patient can hold Eliquis  for 3 days prior to procedure. Plan to resume eliquis  once adequate hemostasis has been achieved.   I will route to requesting team to service as preop eval and remove from the preop pool    Debria Fang, PA-C 10/20/2023, 7:47 AM

## 2023-10-20 NOTE — Evaluation (Signed)
 Physical Therapy Evaluation Patient Details Name: Cory [name] MRN: [id number] DOB: Aug 15, 1945 Today's Date: 10/20/2023  History of Present Illness  Pt is 78 year old presented to ED 6/12 for lethargy and unresponsiveness, hypothermia and hypoglycemia.HR on telemetry in A-flutter encephalopathy likely due to hypoglycemia MRI showing small acute left parietal infarct and moderate chronic small vessel ischemic disease.ZHY:QMVHQION mellitus type 2, chronic kidney disease stage III, colon cancer in the splenic flexure, atrial flutter on Eliquis , previous history of seizures secondary to hyperglycemia, BKA in 2022 previous history of CVA  Clinical Impression  PTA pt at [facility] from prior hospitalization in May 2025. Pt is A&O x4 but has poor awareness of his medical condition. Evaluation of mobility limited due to lack of R LE prosthetic, but with HOB elevated pt is able to come to EoB and with CGA and elevated bed pt able to come to standing at EOB with RW and hop pivot from bed to chair. Pt lives alone and family can not provide 24/7 care. PT currently recommending return to [facility] to finish rehab prior to return to his home. PT will continue to follow acutely.        If plan is discharge home, recommend the following: A little help with walking and/or transfers;A little help with bathing/dressing/bathroom;Assistance with cooking/housework;Direct supervision/assist for medications management;Direct supervision/assist for financial management;Assist for transportation   Can travel by private vehicle   Yes    Equipment Recommendations None recommended by PT     Functional Status Assessment Patient has had a recent decline in their functional status and demonstrates the ability to make significant improvements in function in a reasonable and predictable amount of time.     Precautions / Restrictions Precautions Precautions: Fall (R BKA) Recall of Precautions/Restrictions:  Intact Required Braces or Orthoses: Other Brace Other Brace: RLE prosthetic Restrictions Weight Bearing Restrictions Per Provider Order: No      Mobility  Bed Mobility Overal bed mobility: Modified Independent Bed Mobility: Supine to Sit     Supine to sit: HOB elevated, Used rails     General bed mobility comments: use of bed rails to pull to EOB, no assist required    Transfers Overall transfer level: Needs assistance Equipment used: Rolling walker (2 wheels) Transfers: Sit to/from Stand Sit to Stand: Contact guard assist, From elevated surface   Step pivot transfers: Contact guard assist Squat pivot transfers: Contact guard assist     General transfer comment: with bed elevated pt requiring cuing for proper hand placement, pt requires increased time and effort to come to standing, pt able to take hopping steps to pivot to recliner on his L.         Balance Overall balance assessment: Needs assistance Sitting-balance support: Feet supported, No upper extremity supported Sitting balance-Leahy Scale: Fair     Standing balance support: During functional activity, Reliant on assistive device for balance, Bilateral upper extremity supported Standing balance-Leahy Scale: Poor Standing balance comment: needs RW support                             Pertinent Vitals/Pain Pain Assessment Pain Assessment: No/denies pain (reports L ankle has hurt recently)    Home Living Family/patient expects to be discharged to:: Private residence Living Arrangements: Alone Available Help at Discharge:  (periodic help) Type of Home: Apartment Home Access: Level entry       Home Layout: One level Home Equipment: Rollator (4 wheels);Wheelchair -  manual;Tub bench;BSC/3in1;Grab bars - tub/shower;Hand held shower head Additional Comments: aide comes 1x/wk does laundry and cleans, niece drives to grocery store and dr's appointments    Prior Function Prior Level of  Function : Independent/Modified Independent             Mobility Comments: uses prosthetic most of time but occasionally mobilizes with w/c. Apartment is accessible. ADLs Comments: neice assists with finanaces and transportation and occasionally provides meals     Extremity/Trunk Assessment   Upper Extremity Assessment Upper Extremity Assessment: Defer to OT evaluation    Lower Extremity Assessment Lower Extremity Assessment: LLE deficits/detail LLE Deficits / Details: pt with numbness and tingling in ankle foot and toes, pt report gradual onset, sometime quite painful when needing to hop without prosthetic, ankle lacking active dorsiflexion has PRM LLE Sensation: decreased light touch LLE Coordination: WNL    Cervical / Trunk Assessment Cervical / Trunk Assessment: Normal  Communication   Communication Communication: Impaired Factors Affecting Communication: Hearing impaired (accent from [place])    Cognition Arousal: Alert Behavior During Therapy: WFL for tasks assessed/performed   PT - Cognitive impairments: History of cognitive impairments                       PT - Cognition Comments: A&ox4, appropriate today Following commands: Intact       Cueing Cueing Techniques: Verbal cues     General Comments General comments (skin integrity, edema, etc.): VSS on RA, prosthetic not present so only able to perform pivot to recliner        Assessment/Plan    PT Assessment Patient needs continued PT services  PT Problem List Decreased strength;Decreased range of motion;Decreased activity tolerance;Decreased balance;Decreased mobility;Decreased coordination;Decreased safety awareness;Impaired sensation;Pain       PT Treatment Interventions DME instruction;Gait training;Functional mobility training;Therapeutic activities;Therapeutic exercise;Balance training;Cognitive remediation;Patient/family education;Wheelchair mobility training    PT Goals (Current goals  can be found in the Care Plan section)  Acute Rehab PT Goals Patient Stated Goal: to go home PT Goal Formulation: With patient Time For Goal Achievement: 11/03/23 Potential to Achieve Goals: Good    Frequency Min 2X/week     Co-evaluation PT/OT/SLP Co-Evaluation/Treatment:  (+2 to progress mobility, prosthesis not present)             AM-PAC PT 6 Clicks Mobility  Outcome Measure Help needed turning from your back to your side while in a flat bed without using bedrails?: A Little Help needed moving from lying on your back to sitting on the side of a flat bed without using bedrails?: A Little Help needed moving to and from a bed to a chair (including a wheelchair)?: A Little Help needed standing up from a chair using your arms (e.g., wheelchair or bedside chair)?: A Little Help needed to walk in hospital room?: A Lot Help needed climbing 3-5 steps with a railing? : Total 6 Click Score: 15    End of Session Equipment Utilized During Treatment: Gait belt Activity Tolerance: Patient tolerated treatment well Patient left: in chair;with call bell/phone within reach;with chair alarm set Nurse Communication: Mobility status PT Visit Diagnosis: Unsteadiness on feet (R26.81);Muscle weakness (generalized) (M62.81);Difficulty in walking, not elsewhere classified (R26.2);Other abnormalities of gait and mobility (R26.89);Repeated falls (R29.6);Other symptoms and signs involving the nervous system (R29.898)    Time: 2952-8413 PT Time Calculation (min) (ACUTE ONLY): 46 min   Charges:   PT Evaluation $PT Eval Moderate Complexity: 1 Mod PT Treatments $Gait Training: 8-22 mins $Therapeutic  Activity: 8-22 mins PT General Charges $$ ACUTE PT VISIT: 1 Visit         Cory [name] B. Jewel Mortimer PT, DPT Acute Rehabilitation Services Please use secure chat or  Call Office [phone]   Verlie Glisson [facility] 10/20/2023, 11:22 AM

## 2023-10-20 NOTE — Telephone Encounter (Signed)
 Patient with diagnosis of A flutter on Eliquis  for anticoagulation. Patient currently admitted.  Procedure: Colon Cancer Robotic Colon Resection  Date of procedure: TBD   CHA2DS2-VASc Score = 6  This indicates a 9.7% annual risk of stroke. The patient's score is based upon: CHF History: 1 HTN History: 1 Diabetes History: 1 Stroke History: 0 Vascular Disease History: 1 Age Score: 2 Gender Score: 0    CrCl 39 ml/min Platelet count 146K    Per office protocol, patient can hold Eliquis  for 3 days prior to procedure.    **This guidance is not considered finalized until pre-operative APP has relayed final recommendations.**

## 2023-10-21 DIAGNOSIS — R55 Syncope and collapse: Secondary | ICD-10-CM | POA: Diagnosis not present

## 2023-10-21 LAB — CBC
HCT: 31.3 % — ABNORMAL LOW (ref 39.0–52.0)
Hemoglobin: 10 g/dL — ABNORMAL LOW (ref 13.0–17.0)
MCH: 25.9 pg — ABNORMAL LOW (ref 26.0–34.0)
MCHC: 31.9 g/dL (ref 30.0–36.0)
MCV: 81.1 fL (ref 80.0–100.0)
Platelets: 140 10*3/uL — ABNORMAL LOW (ref 150–400)
RBC: 3.86 MIL/uL — ABNORMAL LOW (ref 4.22–5.81)
RDW: 17.8 % — ABNORMAL HIGH (ref 11.5–15.5)
WBC: 4.1 10*3/uL (ref 4.0–10.5)
nRBC: 0 % (ref 0.0–0.2)

## 2023-10-21 LAB — GLUCOSE, CAPILLARY
Glucose-Capillary: 155 mg/dL — ABNORMAL HIGH (ref 70–99)
Glucose-Capillary: 161 mg/dL — ABNORMAL HIGH (ref 70–99)

## 2023-10-21 LAB — BASIC METABOLIC PANEL WITH GFR
Anion gap: 10 (ref 5–15)
BUN: 16 mg/dL (ref 8–23)
CO2: 22 mmol/L (ref 22–32)
Calcium: 8.3 mg/dL — ABNORMAL LOW (ref 8.9–10.3)
Chloride: 104 mmol/L (ref 98–111)
Creatinine, Ser: 1.86 mg/dL — ABNORMAL HIGH (ref 0.61–1.24)
GFR, Estimated: 37 mL/min — ABNORMAL LOW (ref >60)
Glucose, Bld: 168 mg/dL — ABNORMAL HIGH (ref 70–99)
Potassium: 4 mmol/L (ref 3.5–5.1)
Sodium: 136 mmol/L (ref 135–145)

## 2023-10-21 LAB — PROLACTIN: Prolactin: 16.6 ng/mL (ref 3.6–25.2)

## 2023-10-21 LAB — MAGNESIUM: Magnesium: 1.8 mg/dL (ref 1.7–2.4)

## 2023-10-21 MED ORDER — LANTUS SOLOSTAR 100 UNIT/ML ~~LOC~~ SOPN
15.0000 [IU] | PEN_INJECTOR | Freq: Every day | SUBCUTANEOUS | Status: AC
Start: 2023-10-21 — End: ?

## 2023-10-21 NOTE — Progress Notes (Signed)
 DISCHARGE NOTE HOME Cory [name] to be discharged Home per MD order. Discussed prescriptions and follow up appointments with the patient. Prescriptions given to patient; medication list explained in detail. Patient verbalized understanding.  Skin clean, dry and intact without evidence of skin break down, no evidence of skin tears noted. IV catheter discontinued intact. Site without signs and symptoms of complications. Dressing and pressure applied. Pt denies pain at the site currently. No complaints noted.  Patient free of lines, drains, and wounds.   An After Visit Summary (AVS) was printed and given to the patient. Taken to Discharge Lounge to wait for ride. Patient will be escorted via wheelchair, and discharged home via private auto.  Tonda Francisco, RN

## 2023-10-21 NOTE — Progress Notes (Signed)
 LM for both nieces regarding discharge today

## 2023-10-21 NOTE — Progress Notes (Signed)
 RNCM confirmed with Stevens Eland at [facility] services are scheduled.

## 2023-10-21 NOTE — Discharge Summary (Signed)
 Physician Discharge Summary   Patient: Cory [name] MRN: [id number] DOB: [birth year]  Admit date:     10/19/2023  Discharge date: 10/21/23  Discharge Physician: Ephriam Hashimoto   PCP: Janese Medicine., MD     Recommendations at discharge:  Follow up with Neurology Dr. Christiane Cowing in 6 weeks for stroke Follow up with PCP Dr. Yvonnie Heritage in 1 week for hypertension and diabetes Dr. Yvonnie Heritage: BP meds and insulin  temporarily reduced at discharge due to low normal BP and low glucose; please adjust as appropriate     Discharge Diagnoses: Principal Problem:   Acute periventricular stroke, likely small vessel source in setting of hypoglycemia Active Problems:   Acute metabolic encephalopathy due to hypoglycemia   Hypertension   Type 2 diabetes mellitus with chronic kidney disease, with long-term current use of insulin  (HCC)   Atrial flutter with rapid ventricular response (HCC)   CKD stage 3b, GFR 30-44 ml/min (HCC)   HFrEF (heart failure with reduced ejection fraction) (HCC)   Anemia of CKD   Hypokalemia    Hospital Course: 78 yo M with 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  Had recently been admitted for lethargy, found to have DKA.  Discharged to SNF.  Was preparing to discharge from SNF when he was found unresponsive, transported to ER where he was hypoglycemic and hypotensive.  MRI brain obtained that showed acute infarct.  Neurology consulted.     Stroke Possibly incidental finding, possibly due to small vessel disease from hypoglycemia.  Neurology consulted, recommended continuation of Eliquis , atorvastatin  20 mg, better diabetes control.  Neurology follow up in 6 weeks.    Acute metabolic encephalopathy due to hypoglycemia Patient was unresponsive the morning of admission, found to have blood sugar 30s, given dextrose  by EMS and improved.    Diabetes with  hypoglycemia Hypoglycemic on admission.  Discharged on reduced dose insulin .  Recommend close PCP follow-up for adjustment.  Hypertension BP low normal in the hospital.  Telmisartan  stopped, amlodipine , torsemide  and carvedilol  continued, and Pyrtle held at discharge.  Recommend close follow-up with PCP.               [place] [employer]  Controlled Substances Registry was reviewed for this patient prior to discharge.  Consultants: Neurology Procedures performed:  MRI brain MRA head Carotid US    Disposition: Home health Diet recommendation:  Discharge Diet Orders (From admission, onward)     Start     Ordered   10/21/23 0000  Diet - low sodium heart healthy        10/21/23 0910             DISCHARGE MEDICATION: Allergies as of 10/21/2023       Reactions   Pork-derived Products    Shellfish Allergy         Medication List     PAUSE taking these medications    isosorbide -hydrALAZINE  20-37.5 MG tablet Wait to take this until your doctor or other care provider tells you to start again. Commonly known as: BIDIL  Take 2 tablets by mouth 3 (three) times daily.       STOP taking these medications    telmisartan  40 MG tablet Commonly known as: Micardis        TAKE these medications    acetaminophen  325 MG tablet Commonly known as: TYLENOL  Take 2 tablets (650 mg total) by mouth every 6 (six) hours as needed for mild pain,  headache or fever (or Fever >/= 101).   amLODipine  5 MG tablet Commonly known as: NORVASC  Take 1 tablet (5 mg total) by mouth daily.   atorvastatin  20 MG tablet Commonly known as: LIPITOR Take 20 mg by mouth at bedtime.   carvedilol  25 MG tablet Commonly known as: COREG  Take 25 mg by mouth 2 (two) times daily with a meal.   Eliquis  5 MG Tabs tablet Generic drug: apixaban  Take 5 mg by mouth 2 (two) times daily.   insulin  lispro 100 UNIT/ML injection Commonly known as: HUMALOG Inject 0-12 Units into the skin with  breakfast, with lunch, and with evening meal.   Lantus  SoloStar 100 UNIT/ML Solostar Pen Generic drug: insulin  glargine Inject 15 Units into the skin at bedtime. What changed: how much to take   pantoprazole  40 MG tablet Commonly known as: PROTONIX  Take 40 mg by mouth 2 (two) times daily.   polyethylene glycol 17 g packet Commonly known as: MIRALAX  / GLYCOLAX  Take 17 g by mouth daily as needed.   Potassium Chloride  ER 20 MEQ Tbcr Take 20 mEq by mouth daily.   sertraline  50 MG tablet Commonly known as: ZOLOFT  Take 50 mg by mouth daily.   tamsulosin  0.4 MG Caps capsule Commonly known as: FLOMAX  Take 0.4 mg by mouth daily after supper.   torsemide  20 MG tablet Commonly known as: DEMADEX  Take 20 mg by mouth daily.   Trulicity  0.75 MG/0.5ML Soaj Generic drug: Dulaglutide  Inject 0.75 mg into the skin once a week. Tuesday's   Vitamin D (Ergocalciferol) 1.25 MG (50000 UNIT) Caps capsule Commonly known as: DRISDOL Take 50,000 Units by mouth once a week.        Follow-up Information     [facility] Follow up.   Specialty: Home Health Services Why: Registered Nurse, Physical Therapy, Occupational Therapy, Aide-office to call with visit times. Contact information: [address] [place] [address] [phone]         [facility]. Schedule an appointment as soon as possible for a visit in 1 month(s).   Specialty: Neurology Why: stroke clinic Contact information: [address] [place]  [address] [phone]        Janese Medicine., MD. Schedule an appointment as soon as possible for a visit in 2 week(s).   Specialty: Family Medicine Contact information: [address] [place] [address] [phone]                 Discharge Instructions     Ambulatory referral to Neurology   Complete by: As directed    Follow up with stroke clinic NP at [facility]  in about 4-6 weeks. Thanks.   Diet - low sodium heart healthy   Complete by: As directed    Discharge instructions   Complete by: As directed    **IMPORTANT DISCHARGE INSTRUCTIONS**   From Dr. Darlyn Eke: You were admitted for low blood sugar and found here to have a stroke  For the stroke, you had an MRI (imaging of the brain and a separate MRI of the blood vessels) The MRI showed the stroke but no severe blood vessel disease that needed special treatment You also had an ultrasound of the neck arteries, that also showed no significant problems  Our stroke specialist, Dr. Christiane Cowing, feels that the stroke was likely from low blood sugar and low blood pressure.  For that reason: REDUCE your Lantus  (long-acting insulin ) for now  HOLD (do not take) your hydralazine -isosorbide  for now STOP telmisartan   Go see your primary doctor in 1 week and ask them if you should increase/Resume these medicines  Resume your other home medicines  Go see Dr. [facility]e Cow[facility] at [facility] in 6 weeks   Increase activity slowly   Complete by: As directed    No wound care   Complete by: As directed        Discharge Exam: Filed Weights   10/19/23 2048 10/20/23 0514 10/21/23 0545  Weight: 85.2 kg 85.6 kg 85.9 kg    General: Pt is alert, awake, not in acute distress Cardiovascular: RRR, nl S1-S2, no murmurs appreciated.   No LE edema.   Respiratory: Normal respiratory rate and rhythm.  CTAB without rales or wheezes. Abdominal: Abdomen soft and non-tender.  No distension or HSM.   Neuro/Psych: Strength symmetric in upper and lower extremities.  Judgment and insight appear normal.  Chronic left foot drop.  Right BKA.   Condition at discharge: fair  The results of significant diagnostics from this hospitalization (including imaging, microbiology, ancillary and laboratory) are listed below for reference.   Imaging Studies: VAS US  CAROTID Result Date: 10/21/2023 Carotid Arterial Duplex Study Patient Name:   Rhylan Vadala  Date of Exam:   10/19/2023 Medical Rec #: [id number]     Accession #:    [id number] Date of Birth: 29-Apr-1946     Patient Gender: M Patient Age:   69 years Exam Location:  [facility] Procedure:      VAS US  CAROTID Referring Phys: EKTA PATEL --------------------------------------------------------------------------------  Indications:      Syncope. Risk Factors:     Hypertension, hyperlipidemia, Diabetes, past history of                   smoking. Other Factors:    CKD, Afib, CHF,. Comparison Study: Previous exam on 02/18/2023 was WNL Performing Technologist: Arlyce Berger RVT, RDMS  Examination Guidelines: A complete evaluation includes B-mode imaging, spectral Doppler, color Doppler, and power Doppler as needed of all accessible portions of each vessel. Bilateral testing is considered an integral part of a complete examination. Limited examinations for reoccurring indications may be performed as noted.  Right Carotid Findings: +----------+--------+--------+--------+------------------+--------+           PSV cm/sEDV cm/sStenosisPlaque DescriptionComments +----------+--------+--------+--------+------------------+--------+ CCA Prox  60      11                                         +----------+--------+--------+--------+------------------+--------+ CCA Distal37      6                                          +----------+--------+--------+--------+------------------+--------+ ICA Prox  31      9                                          +----------+--------+--------+--------+------------------+--------+ ICA Distal33      8                                          +----------+--------+--------+--------+------------------+--------+  ECA       51      0                                          +----------+--------+--------+--------+------------------+--------+ +----------+--------+-------+----------------+-------------------+           PSV cm/sEDV cmsDescribe         Arm Pressure (mmHG) +----------+--------+-------+----------------+-------------------+ Subclavian70             Multiphasic, WNL                    +----------+--------+-------+----------------+-------------------+ +---------+--------+--+--------+-+---------+ VertebralPSV cm/s29EDV cm/s9Antegrade +---------+--------+--+--------+-+---------+  Left Carotid Findings: +----------+--------+--------+--------+------------------+--------+           PSV cm/sEDV cm/sStenosisPlaque DescriptionComments +----------+--------+--------+--------+------------------+--------+ CCA Prox  80      12                                         +----------+--------+--------+--------+------------------+--------+ CCA Distal47      10                                         +----------+--------+--------+--------+------------------+--------+ ICA Prox  36      10                                         +----------+--------+--------+--------+------------------+--------+ ICA Distal40      13                                         +----------+--------+--------+--------+------------------+--------+ ECA       43      0                                          +----------+--------+--------+--------+------------------+--------+ +----------+--------+--------+----------------+-------------------+           PSV cm/sEDV cm/sDescribe        Arm Pressure (mmHG) +----------+--------+--------+----------------+-------------------+ [id number]             Multiphasic, WNL                    +----------+--------+--------+----------------+-------------------+ +---------+--------+--+--------+-+---------+ VertebralPSV cm/s19EDV cm/s4Antegrade +---------+--------+--+--------+-+---------+   Summary: Right Carotid: The extracranial vessels were near-normal with only minimal wall                thickening or plaque. Left Carotid: The extracranial vessels were near-normal with only minimal  wall               thickening or plaque. Vertebrals:  Bilateral vertebral arteries demonstrate antegrade flow. Subclavians: Normal flow hemodynamics were seen in bilateral subclavian              arteries. *See table(s) above for measurements and observations.  Electronically signed by Genny Kid MD on 10/21/2023 at 10:58:08 AM.    Final    MR ANGIO HEAD WO CONTRAST Result Date: 10/20/2023 CLINICAL DATA:  Follow-up examination for stroke. EXAM: MRA HEAD WITHOUT  CONTRAST TECHNIQUE: Angiographic images of the Circle of Willis were acquired using MRA technique without intravenous contrast. COMPARISON:  Prior brain MRI from 10/19/23 FINDINGS: Anterior circulation: Both internal carotid arteries are patent to the termini without stenosis or other abnormality. A1 segments, anterior communicating artery complex, and anterior cerebral arteries widely patent. No M1 stenosis or occlusion. Distal MCA branches perfused and symmetric. Posterior circulation: Both V4 segments patent without stenosis. Right vertebral artery dominant. Right PICA patent. Left PICA not seen. Basilar patent without stenosis. Superior cerebral arteries patent bilaterally. Right PCA supplied via the basilar. Fetal type origin of the left PCA. Both PCAs patent without stenosis. Anatomic variants: Fetal type left PCA. Dominant right vertebral artery. Other: No intracranial aneurysm. IMPRESSION: Normal intracranial MRA. Electronically Signed   By: Virgia Griffins M.D.   On: 10/20/2023 01:10   DG Chest 1 View Result Date: 10/19/2023 CLINICAL DATA:  Altered mental status EXAM: CHEST  1 VIEW COMPARISON:  09/20/2023 FINDINGS: Heart and mediastinal contours are within normal limits. No focal opacities or effusions. No acute bony abnormality. IMPRESSION: No active disease. Electronically Signed   By: Janeece Mechanic M.D.   On: 10/19/2023 20:32   MR BRAIN WO CONTRAST Result Date: 10/19/2023 CLINICAL DATA:  Mental status change, unknown cause.  Syncope/presyncope, cerebrovascular cause suspected. EXAM: MRI HEAD WITHOUT CONTRAST TECHNIQUE: Multiplanar, multiecho pulse sequences of the brain and surrounding structures were obtained without intravenous contrast. COMPARISON:  Head CT 10/19/2023 and MRI 05/09/2023 FINDINGS: Brain: There are several subcentimeter acute infarcts involving white matter and cortex in the left parietal lobe. A chronic subcortical infarct is again noted anteriorly in the right frontal lobe, and there is also an unchanged tiny chronic cortical infarct in the left superior frontal gyrus. Patchy to confluent T2 hyperintensities in the cerebral white matter bilaterally are similar to the prior MRI and are nonspecific but compatible with moderate chronic small vessel ischemic disease. There are chronic lacunar infarcts in the thalami and cerebellum. There is moderate cerebral atrophy. Chronic microhemorrhages are present in the the deep gray nuclei as well as more peripheral portions of both cerebral hemispheres have increased in number from the prior MRI and are nonspecific but may be related to chronic hypertension and chronic ischemia. No mass, midline shift, or extra-axial fluid collection is identified. Vascular: Major intracranial vascular flow voids are preserved. Skull and upper cervical spine: No suspicious marrow lesion. 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 Minimal mucosal thickening in the paranasal sinuses. No significant mastoid fluid. Other: None. IMPRESSION: 1. Small acute left parietal infarcts. 2. Moderate chronic small vessel ischemic disease. Electronically Signed   By: Aundra Lee M.D.   On: 10/19/2023 16:51   CT Head Wo Contrast Result Date: 10/19/2023 CLINICAL DATA:  Head trauma, minor (Age >= 65y). EXAM: CT HEAD WITHOUT CONTRAST TECHNIQUE: Contiguous axial images were obtained from the base of the skull through the vertex without  intravenous contrast. RADIATION DOSE REDUCTION: This exam was performed according to the departmental dose-optimization program which includes automated exposure control, adjustment of the mA and/or kV according to patient size and/or use of iterative reconstruction technique. COMPARISON:  Head CT and MRI 05/09/2023 FINDINGS: Brain: There is no evidence of an acute infarct, intracranial hemorrhage, mass, midline shift, or extra-axial fluid collection. There is moderate cerebral atrophy. Patchy hypodensities in the cerebral white matter bilaterally are unchanged and nonspecific but compatible with moderate chronic small vessel ischemic disease. A chronic cortical infarct is again noted  anteriorly in the right frontal lobe. Vascular: Calcified atherosclerosis at the skull base. No hyperdense vessel. Skull: No fracture or suspicious lesion. Sinuses/Orbits: Mild mucosal thickening in the ethmoid sinuses. No significant mastoid fluid. Bilateral cataract extraction. Other: None. IMPRESSION: 1. No evidence of acute intracranial abnormality. 2. Moderate chronic small vessel ischemic disease. Electronically Signed   By: Aundra Lee M.D.   On: 10/19/2023 10:11    Microbiology: Results for orders placed or performed during the hospital encounter of 10/19/23  Blood culture (routine x 2)     Status: None (Preliminary result)   Collection Time: 10/19/23 12:43 PM   Specimen: BLOOD  Result Value Ref Range Status   Specimen Description BLOOD RIGHT ANTECUBITAL  Final   Special Requests   Final    AEROBIC BOTTLE ONLY Blood Culture results may not be optimal due to an inadequate volume of blood received in culture bottles   Culture   Final    NO GROWTH 2 DAYS Performed at [facility], [address], [place], [place] [address]    Report Status PENDING  Incomplete  Blood culture (routine x 2)     Status: None (Preliminary result)   Collection Time: 10/19/23 12:48 PM   Specimen: BLOOD RIGHT HAND  Result Value  Ref Range Status   Specimen Description BLOOD RIGHT HAND  Final   Special Requests   Final    AEROBIC BOTTLE ONLY Blood Culture results may not be optimal due to an inadequate volume of blood received in culture bottles   Culture   Final    NO GROWTH 2 DAYS Performed at [facility], [address] [place], [place] [address]    Report Status PENDING  Incomplete    Labs: CBC: Recent Labs  Lab 10/19/23 0910 10/21/23 0417  WBC 5.1 4.1  NEUTROABS 3.7  --   HGB 11.3* 10.0*  HCT 34.8* 31.3*  MCV 80.9 81.1  PLT 146* 140*   Basic Metabolic Panel: Recent Labs  Lab 10/19/23 0910 10/20/23 1759 10/21/23 0417  NA 137 135 136  K 2.8* 3.6 4.0  CL 104 103 104  CO2 26 23 22   GLUCOSE 165* 177* 168*  BUN 14 15 16   CREATININE 1.95* 1.76* 1.86*  CALCIUM  8.1* 8.3* 8.3*  MG  --   --  1.8   Liver Function Tests: Recent Labs  Lab 10/19/23 0910  AST 17  ALT 13  ALKPHOS 63  BILITOT 0.5  PROT 6.3*  ALBUMIN 2.6*   CBG: Recent Labs  Lab 10/20/23 1201 10/20/23 1621 10/20/23 2102 10/21/23 0547 10/21/23 0732  GLUCAP 161* 224* 127* 155* 161*    Discharge time spent: approximately 45 minutes spent on discharge counseling, evaluation of patient on day of discharge, and coordination of discharge planning with nursing, social work, pharmacy and case management  Signed: Ephriam Hashimoto, MD [facility] 10/21/2023

## 2023-10-24 LAB — CULTURE, BLOOD (ROUTINE X 2)
Culture: NO GROWTH
Culture: NO GROWTH

## 2023-10-25 ENCOUNTER — Ambulatory Visit

## 2023-10-25 VITALS — BP 165/82 | HR 60 | Temp 98.6°F | Resp 18 | Ht 71.0 in | Wt 189.0 lb

## 2023-10-25 DIAGNOSIS — D509 Iron deficiency anemia, unspecified: Secondary | ICD-10-CM

## 2023-10-25 DIAGNOSIS — I501 Left ventricular failure: Secondary | ICD-10-CM

## 2023-10-25 MED ORDER — IRON SUCROSE 20 MG/ML IV SOLN
200.0000 mg | Freq: Once | INTRAVENOUS | Status: AC
  Administered 2023-10-25: 200 mg via INTRAVENOUS
  Filled 2023-10-25: qty 10

## 2023-10-25 NOTE — Progress Notes (Signed)
 Diagnosis: Iron  Deficiency Anemia  Provider:  Praveen Mannam MD  Procedure: IV Push  IV Type: Peripheral, IV Location: L Forearm  Venofer  (Iron  Sucrose), Dose: 200 mg  Post Infusion IV Care: Observation period completed  Discharge: Condition: Good, Destination: Home . AVS Provided  Performed by:  Rachelle Bue, RN

## 2023-11-21 ENCOUNTER — Encounter: Payer: Self-pay | Admitting: Neurology

## 2023-11-21 ENCOUNTER — Ambulatory Visit: Admitting: Neurology

## 2023-11-21 VITALS — BP 136/74 | Ht 71.0 in | Wt 183.5 lb

## 2023-11-21 DIAGNOSIS — I639 Cerebral infarction, unspecified: Secondary | ICD-10-CM | POA: Diagnosis not present

## 2023-11-21 DIAGNOSIS — I501 Left ventricular failure: Secondary | ICD-10-CM

## 2023-11-21 DIAGNOSIS — Z794 Long term (current) use of insulin: Secondary | ICD-10-CM

## 2023-11-21 DIAGNOSIS — E1122 Type 2 diabetes mellitus with diabetic chronic kidney disease: Secondary | ICD-10-CM | POA: Diagnosis not present

## 2023-11-21 NOTE — Addendum Note (Signed)
 Addended by: GAYLAND LAURAINE PARAS on: 11/21/2023 02:13 PM   Modules accepted: Level of Service

## 2023-11-21 NOTE — Patient Instructions (Addendum)
 Recommend being evaluated for the GI upset, call your primary care doctor to give update, may consider going to the ER or urgent care after discussing with your primary care  Continue Eliquis  for treatment of AFIB  Strict management of vascular risk factors with a goal BP less than 130/90, A1c less than 7.0, LDL less than 70 for secondary stroke prevention  Follow up here as needed

## 2023-11-21 NOTE — Progress Notes (Signed)
 Patient: Cory [name] Date of Birth: 03-23-1946  Reason for Visit: Stroke Clinic Follow Up  History from: Patient Primary Neurologist: Cory [name]   ASSESSMENT AND PLAN 78 y.o. year old male with stroke incidental finding, small acute left parietal infarcts in the setting of altered mental status with hypothermia and hypoglycemia (in the 30's).  History of A-fib on Eliquis .  A1c 9.6.  Vascular risk factors: HTN, HLD, A-fib. Denies any residual deficits.   - Experiencing GI upset, nausea since yesterday.  Does not appear to be feeling well, vomited in the office today. Refused to go to the ER at my recommendation.  Advised to contact primary care doctor, likely may need to be evaluated in the ER given history of uncontrolled DM.  - From stroke standpoint, does not appear to have any residual deficits - Remains on Eliquis  with history of A-fib.  Advised compliance with medications - Continue close follow-up with primary care strict management of vascular risk factors with a goal BP less than 130/90, A1c less than 7.0, LDL less than 70 for secondary stroke prevention. Discussed close collaboration with PCP, as he lives alone, I would want to ensure he is able to care for himself and his affairs. He took Cory [name] here today alone.  - Return here on an as needed basis  HISTORY OF PRESENT ILLNESS: Today 11/21/23 Here alone today. Feeling nauseated today. Doesn't recall a lot of details about stroke admission. Had recently been discharged from [facility]. Remains on Eliquis . After last visit, was discharged home alone, PT and OT are coming out. He took Cory [name] here today. He thinks back to normal. Glucose running in the mid 100's. BP 136/74 today. Has seen PCP. Since yesterday has been having nausea, had vomiting yesterday. Claims he is taking all his medication. Feels nauseated today. Denies any weakness or residual deficits after recent discharge.   HISTORY  Admitted in June 2025 for altered mental status.   Found to have hypothermia and hypoglycemia.  Glucose was in the 30s.  Symptoms improved after dextrose .  A1c 9.6.  Incidental stroke discovered.  3 small left periventricular infarcts likely due to small vessel disease in the setting of hypoglycemia.  History of A-fib on Eliquis .  Lipitor 20 mg daily was added.  Also has history of seizure in the setting of hyperglycemia.  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  EEG showed diffuse encephalopathy.  Also admitted January 2025 for encephalopathy with AKI, COVID, hyperglycemia was intubated.  -CT head no acute abnormality - MRI of the brain small acute left parietal infarcts, moderate chronic small vessel ischemic disease  - MRA head and neck normal - Carotid Doppler unremarkable - 2D echo EF 25 to 30% in May 2025 - LDL 54 - A1c 9.6 - Eliquis  5 mg twice daily prior to admission, continued at discharge  REVIEW OF SYSTEMS: Out of a complete 14 system review of symptoms, the patient complains only of the following symptoms, and all other reviewed systems are negative.  See HPI  ALLERGIES: Allergies  Allergen Reactions   Pork-Derived Products    Shellfish Allergy     HOME MEDICATIONS: Outpatient Medications Prior to Visit  Medication Sig Dispense Refill   acetaminophen  (TYLENOL ) 325 MG tablet Take 2 tablets (650 mg total) by mouth every 6 (six) hours as needed for mild pain, headache or fever (or Fever >/= 101).     amLODipine  (NORVASC ) 5  MG tablet Take 1 tablet (5 mg total) by mouth daily.     apixaban  (ELIQUIS ) 5 MG TABS tablet Take 5 mg by mouth 2 (two) times daily.     carvedilol  (COREG ) 25 MG tablet Take 25 mg by mouth 2 (two) times daily with a meal.     insulin  lispro (HUMALOG) 100 UNIT/ML injection Inject 0-12 Units into the skin with breakfast, with lunch, and with evening meal.     LANTUS  SOLOSTAR 100 UNIT/ML Solostar Pen Inject  15 Units into the skin at bedtime.     polyethylene glycol (MIRALAX  / GLYCOLAX ) 17 g packet Take 17 g by mouth daily as needed.     Potassium Chloride  ER 20 MEQ TBCR Take 20 mEq by mouth daily.     sertraline  (ZOLOFT ) 50 MG tablet Take 50 mg by mouth daily.     tamsulosin  (FLOMAX ) 0.4 MG CAPS capsule Take 0.4 mg by mouth daily after supper.     telmisartan  (MICARDIS ) 40 MG tablet Take 40 mg by mouth daily.     TRULICITY  0.75 MG/0.5ML SOAJ Inject 0.75 mg into the skin once a week. Tuesday's     Vitamin D, Ergocalciferol, (DRISDOL) 1.25 MG (50000 UNIT) CAPS capsule Take 50,000 Units by mouth once a week.     atorvastatin  (LIPITOR) 20 MG tablet Take 20 mg by mouth at bedtime. (Patient not taking: Reported on 11/21/2023)     isosorbide -hydrALAZINE  (BIDIL ) 20-37.5 MG tablet Take 2 tablets by mouth 3 (three) times daily. (Patient not taking: Reported on 11/21/2023)     pantoprazole  (PROTONIX ) 40 MG tablet Take 40 mg by mouth 2 (two) times daily. (Patient not taking: Reported on 11/21/2023)     torsemide  (DEMADEX ) 20 MG tablet Take 20 mg by mouth daily. (Patient not taking: Reported on 11/21/2023)     No facility-administered medications prior to visit.    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   Prostate CA (HCC)    Prostate CA (HCC)    Sepsis (HCC) 11/27/2020    PAST SURGICAL HISTORY: Past Surgical History:  Procedure Laterality Date   AMPUTATION Right 12/04/2020   Procedure: RIGHT BELOW KNEE AMPUTATION;  Surgeon: Harden Cory GAILS, MD;  Location: [facility];  Service: Orthopedics;  Laterality: Right;    APPENDECTOMY     APPLICATION OF WOUND VAC Right 12/04/2020   Procedure: APPLICATION OF WOUND VAC;  Surgeon: Harden Cory GAILS, MD;  Location: [facility] OR;  Service: Orthopedics;  Laterality: Right;    FAMILY HISTORY: Family History  Problem Relation Age of Onset   Diabetes Mother    Hypertension Mother    Diabetes Father    Hypertension Father    Cancer Sister 32       breast cancer    SOCIAL HISTORY: Social History   Socioeconomic History   Marital status: Married    Spouse name: Not on file   Number of children: 6   Years of education: Not on file   Highest education level: Not on file  Occupational  History   Not on file  Tobacco Use   Smoking status: Former    Types: Cigarettes   Smokeless tobacco: Never  Vaping Use   Vaping status: Never Used  Substance and Sexual Activity   Alcohol use: Never   Drug use: Never   Sexual activity: Not Currently  Other Topics Concern   Not on file  Social History Narrative   ** Merged History Encounter ** Originally from [place].     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      Lived in [place]  [phone].   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   Right handed   Caffeine-none   Social Drivers of Health   Financial Resource Strain: Not on file  Food Insecurity: No Food Insecurity (10/19/2023)   Hunger Vital Sign    Worried About Running Out of Food in the Last Year: Never true    Ran Out of Food in the Last Year: Never true  Transportation Needs: No Transportation Needs (10/19/2023)   PRAPARE - Administrator, Civil Service (Medical): No    Lack of Transportation (Non-Medical): No  Physical Activity: Not on file  Stress: Not on file  Social Connections: Moderately Isolated (10/19/2023)   Social Connection and Isolation Panel    Frequency of Communication with Friends and Family: Once a week    Frequency of Social Gatherings with Friends and Family: Once a week    Attends  Religious Services: 1 to 4 times per year    Active Member of [employer] or Organizations: No    Attends Banker Meetings: 1 to 4 times per year    Marital Status: Never married  Intimate Partner Violence: Not At Risk (10/19/2023)   Humiliation, Afraid, Rape, and Kick questionnaire    Fear of Current or Ex-Partner: No    Emotionally Abused: No    Physically Abused: No    Sexually Abused: No   PHYSICAL EXAM  Vitals:   11/21/23 1004  BP: 136/74  Weight: 183 lb 8 oz (83.2 kg)  Height: 5' 11 (1.803 m)   Body mass index is 25.59 kg/m.  Generalized: Well developed, in no acute distress, looks to not be feeling well, holding emesis bag  Neurological examination  Mentation: Alert oriented to time, place, history taking. Follows all commands speech and language fluent Cranial nerve II-XII: Pupils were equal round reactive to light. Extraocular movements were full, visual field were full on confrontational test. Facial sensation and strength were normal.  Head turning and shoulder shrug  were normal and symmetric. Motor: The motor testing reveals 5 over 5 strength of all 4 extremities, with right BKA Sensory: Sensory testing is intact to soft touch on all 4 extremities. No evidence of extinction is noted.  Coordination: Cerebellar testing reveals good finger-nose-finger bilaterally  Gait and station: Gait is wide-based, cautious, relies on rolling walker, right BKA Reflexes: Deep tendon reflexes are symmetric but decreased  DIAGNOSTIC DATA (LABS, IMAGING, TESTING) - I reviewed patient records, labs, notes, testing and imaging myself where available.  Lab Results  Component Value Date   WBC 4.1 10/21/2023   HGB 10.0 (L) 10/21/2023   HCT 31.3 (L) 10/21/2023   MCV 81.1 10/21/2023   PLT 140 (L) 10/21/2023      Component Value Date/Time   NA 136 10/21/2023 0417   NA 138 08/28/2023 1438   K 4.0 10/21/2023 0417   CL  104 10/21/2023 0417   CO2 22 10/21/2023 0417   GLUCOSE 168  (H) 10/21/2023 0417   BUN 16 10/21/2023 0417   BUN 27 08/28/2023 1438   CREATININE 1.86 (H) 10/21/2023 0417   CALCIUM  8.3 (L) 10/21/2023 0417   PROT 6.3 (L) 10/19/2023 0910   PROT 6.3 08/28/2023 1438   ALBUMIN 2.6 (L) 10/19/2023 0910   ALBUMIN 3.4 (L) 08/28/2023 1438   AST 17 10/19/2023 0910   ALT 13 10/19/2023 0910   ALKPHOS 63 10/19/2023 0910   BILITOT 0.5 10/19/2023 0910   BILITOT <0.2 08/28/2023 1438   GFRNONAA 37 (L) 10/21/2023 0417   Lab Results  Component Value Date   CHOL 116 10/20/2023   HDL 42 10/20/2023   LDLCALC 54 10/20/2023   TRIG 98 10/20/2023   CHOLHDL 2.8 10/20/2023   Lab Results  Component Value Date   HGBA1C 9.6 (H) 10/19/2023   Lab Results  Component Value Date   VITAMINB12 382 09/21/2023   Lab Results  Component Value Date   TSH 0.688 10/19/2023   Lauraine Born, AGNP-C, DNP 11/21/2023, 10:53 AM [facility] [address], [address] [place], [place] [address] [phone]

## 2023-11-23 ENCOUNTER — Inpatient Hospital Stay (HOSPITAL_COMMUNITY)
Admission: EM | Admit: 2023-11-23 | Discharge: 2023-11-26 | DRG: 683 | Disposition: A | Discharge status: Home / Self Care | Attending: Family Medicine | Admitting: Family Medicine

## 2023-11-23 ENCOUNTER — Other Ambulatory Visit: Payer: Self-pay

## 2023-11-23 ENCOUNTER — Emergency Department (HOSPITAL_COMMUNITY)

## 2023-11-23 ENCOUNTER — Encounter (HOSPITAL_COMMUNITY): Payer: Self-pay | Admitting: Internal Medicine

## 2023-11-23 DIAGNOSIS — F32A Depression, unspecified: Secondary | ICD-10-CM | POA: Diagnosis present

## 2023-11-23 DIAGNOSIS — Z803 Family history of malignant neoplasm of breast: Secondary | ICD-10-CM

## 2023-11-23 DIAGNOSIS — Z89511 Acquired absence of right leg below knee: Secondary | ICD-10-CM

## 2023-11-23 DIAGNOSIS — E785 Hyperlipidemia, unspecified: Secondary | ICD-10-CM | POA: Diagnosis present

## 2023-11-23 DIAGNOSIS — Z8249 Family history of ischemic heart disease and other diseases of the circulatory system: Secondary | ICD-10-CM

## 2023-11-23 DIAGNOSIS — M7989 Other specified soft tissue disorders: Secondary | ICD-10-CM | POA: Diagnosis present

## 2023-11-23 DIAGNOSIS — Z91014 Allergy to mammalian meats: Secondary | ICD-10-CM

## 2023-11-23 DIAGNOSIS — Z87891 Personal history of nicotine dependence: Secondary | ICD-10-CM

## 2023-11-23 DIAGNOSIS — N400 Enlarged prostate without lower urinary tract symptoms: Secondary | ICD-10-CM | POA: Diagnosis present

## 2023-11-23 DIAGNOSIS — Z8546 Personal history of malignant neoplasm of prostate: Secondary | ICD-10-CM

## 2023-11-23 DIAGNOSIS — Z7985 Long-term (current) use of injectable non-insulin antidiabetic drugs: Secondary | ICD-10-CM

## 2023-11-23 DIAGNOSIS — N179 Acute kidney failure, unspecified: Secondary | ICD-10-CM | POA: Diagnosis not present

## 2023-11-23 DIAGNOSIS — Z7901 Long term (current) use of anticoagulants: Secondary | ICD-10-CM

## 2023-11-23 DIAGNOSIS — K219 Gastro-esophageal reflux disease without esophagitis: Secondary | ICD-10-CM | POA: Diagnosis present

## 2023-11-23 DIAGNOSIS — K5900 Constipation, unspecified: Secondary | ICD-10-CM | POA: Diagnosis present

## 2023-11-23 DIAGNOSIS — Z794 Long term (current) use of insulin: Secondary | ICD-10-CM

## 2023-11-23 DIAGNOSIS — N1831 Chronic kidney disease, stage 3a: Secondary | ICD-10-CM | POA: Diagnosis present

## 2023-11-23 DIAGNOSIS — E1122 Type 2 diabetes mellitus with diabetic chronic kidney disease: Secondary | ICD-10-CM | POA: Diagnosis present

## 2023-11-23 DIAGNOSIS — I129 Hypertensive chronic kidney disease with stage 1 through stage 4 chronic kidney disease, or unspecified chronic kidney disease: Secondary | ICD-10-CM | POA: Diagnosis present

## 2023-11-23 DIAGNOSIS — R112 Nausea with vomiting, unspecified: Secondary | ICD-10-CM | POA: Diagnosis not present

## 2023-11-23 DIAGNOSIS — Z79899 Other long term (current) drug therapy: Secondary | ICD-10-CM

## 2023-11-23 DIAGNOSIS — I4892 Unspecified atrial flutter: Secondary | ICD-10-CM | POA: Diagnosis present

## 2023-11-23 DIAGNOSIS — Z91013 Allergy to seafood: Secondary | ICD-10-CM

## 2023-11-23 DIAGNOSIS — Z91018 Allergy to other foods: Secondary | ICD-10-CM

## 2023-11-23 DIAGNOSIS — Z833 Family history of diabetes mellitus: Secondary | ICD-10-CM

## 2023-11-23 DIAGNOSIS — C189 Malignant neoplasm of colon, unspecified: Secondary | ICD-10-CM | POA: Diagnosis present

## 2023-11-23 DIAGNOSIS — E875 Hyperkalemia: Secondary | ICD-10-CM | POA: Diagnosis present

## 2023-11-23 LAB — CBC
HCT: 36.5 % — ABNORMAL LOW (ref 39.0–52.0)
Hemoglobin: 11.9 g/dL — ABNORMAL LOW (ref 13.0–17.0)
MCH: 26.6 pg (ref 26.0–34.0)
MCHC: 32.6 g/dL (ref 30.0–36.0)
MCV: 81.7 fL (ref 80.0–100.0)
Platelets: 188 K/uL (ref 150–400)
RBC: 4.47 MIL/uL (ref 4.22–5.81)
RDW: 16.8 % — ABNORMAL HIGH (ref 11.5–15.5)
WBC: 4 K/uL (ref 4.0–10.5)
nRBC: 0 % (ref 0.0–0.2)

## 2023-11-23 LAB — CBG MONITORING, ED: Glucose-Capillary: 100 mg/dL — ABNORMAL HIGH (ref 70–99)

## 2023-11-23 LAB — GLUCOSE, CAPILLARY
Glucose-Capillary: 159 mg/dL — ABNORMAL HIGH (ref 70–99)
Glucose-Capillary: 283 mg/dL — ABNORMAL HIGH (ref 70–99)

## 2023-11-23 LAB — COMPREHENSIVE METABOLIC PANEL WITH GFR
ALT: 15 U/L (ref 0–44)
AST: 16 U/L (ref 15–41)
Albumin: 3.6 g/dL (ref 3.5–5.0)
Alkaline Phosphatase: 64 U/L (ref 38–126)
Anion gap: 10 (ref 5–15)
BUN: 42 mg/dL — ABNORMAL HIGH (ref 8–23)
CO2: 25 mmol/L (ref 22–32)
Calcium: 9.1 mg/dL (ref 8.9–10.3)
Chloride: 98 mmol/L (ref 98–111)
Creatinine, Ser: 3.06 mg/dL — ABNORMAL HIGH (ref 0.61–1.24)
GFR, Estimated: 20 mL/min — ABNORMAL LOW (ref >60)
Glucose, Bld: 96 mg/dL (ref 70–99)
Potassium: 5.3 mmol/L — ABNORMAL HIGH (ref 3.5–5.1)
Sodium: 133 mmol/L — ABNORMAL LOW (ref 135–145)
Total Bilirubin: 0.7 mg/dL (ref 0.0–1.2)
Total Protein: 7.4 g/dL (ref 6.5–8.1)

## 2023-11-23 LAB — LIPASE, BLOOD: Lipase: 28 U/L (ref 11–51)

## 2023-11-23 MED ORDER — CARVEDILOL 25 MG PO TABS
25.0000 mg | ORAL_TABLET | Freq: Two times a day (BID) | ORAL | Status: DC
  Administered 2023-11-23 – 2023-11-26 (×6): 25 mg via ORAL
  Filled 2023-11-23 (×6): qty 1

## 2023-11-23 MED ORDER — ATORVASTATIN CALCIUM 20 MG PO TABS
20.0000 mg | ORAL_TABLET | Freq: Every day | ORAL | Status: DC
  Administered 2023-11-23 – 2023-11-25 (×3): 20 mg via ORAL
  Filled 2023-11-23 (×3): qty 1

## 2023-11-23 MED ORDER — SODIUM CHLORIDE 0.9 % IV SOLN: INTRAVENOUS | Status: AC

## 2023-11-23 MED ORDER — ONDANSETRON HCL 4 MG/2ML IJ SOLN
4.0000 mg | Freq: Once | INTRAMUSCULAR | Status: AC
  Administered 2023-11-23: 4 mg via INTRAVENOUS
  Filled 2023-11-23: qty 2

## 2023-11-23 MED ORDER — INSULIN ASPART 100 UNIT/ML IJ SOLN
0.0000 [IU] | Freq: Every day | INTRAMUSCULAR | Status: DC
  Administered 2023-11-23: 3 [IU] via SUBCUTANEOUS
  Administered 2023-11-25: 2 [IU] via SUBCUTANEOUS

## 2023-11-23 MED ORDER — APIXABAN 2.5 MG PO TABS
2.5000 mg | ORAL_TABLET | Freq: Two times a day (BID) | ORAL | Status: DC
  Administered 2023-11-23 – 2023-11-26 (×6): 2.5 mg via ORAL
  Filled 2023-11-23 (×6): qty 1

## 2023-11-23 MED ORDER — POLYETHYLENE GLYCOL 3350 17 G PO PACK: 17.0000 g | PACK | Freq: Every day | ORAL | Status: DC | PRN

## 2023-11-23 MED ORDER — ALBUTEROL SULFATE (2.5 MG/3ML) 0.083% IN NEBU: 2.5000 mg | INHALATION_SOLUTION | RESPIRATORY_TRACT | Status: DC | PRN

## 2023-11-23 MED ORDER — SERTRALINE HCL 50 MG PO TABS
50.0000 mg | ORAL_TABLET | Freq: Every day | ORAL | Status: DC
  Administered 2023-11-24 – 2023-11-26 (×3): 50 mg via ORAL
  Filled 2023-11-23 (×3): qty 1

## 2023-11-23 MED ORDER — TRAZODONE HCL 50 MG PO TABS: 25.0000 mg | ORAL_TABLET | Freq: Every evening | ORAL | Status: DC | PRN

## 2023-11-23 MED ORDER — ACETAMINOPHEN 650 MG RE SUPP: 650.0000 mg | Freq: Four times a day (QID) | RECTAL | Status: DC | PRN

## 2023-11-23 MED ORDER — SODIUM CHLORIDE 0.9 % IV SOLN: INTRAVENOUS | Status: DC

## 2023-11-23 MED ORDER — TAMSULOSIN HCL 0.4 MG PO CAPS
0.4000 mg | ORAL_CAPSULE | Freq: Every day | ORAL | Status: DC
  Administered 2023-11-23 – 2023-11-25 (×3): 0.4 mg via ORAL
  Filled 2023-11-23 (×3): qty 1

## 2023-11-23 MED ORDER — ONDANSETRON HCL 4 MG PO TABS: 4.0000 mg | ORAL_TABLET | Freq: Four times a day (QID) | ORAL | Status: DC | PRN

## 2023-11-23 MED ORDER — PANTOPRAZOLE SODIUM 40 MG PO TBEC
40.0000 mg | DELAYED_RELEASE_TABLET | Freq: Two times a day (BID) | ORAL | Status: DC
  Administered 2023-11-23 – 2023-11-26 (×6): 40 mg via ORAL
  Filled 2023-11-23 (×6): qty 1

## 2023-11-23 MED ORDER — INSULIN GLARGINE-YFGN 100 UNIT/ML ~~LOC~~ SOLN: 15.0000 [IU] | Freq: Every day | SUBCUTANEOUS | Status: DC

## 2023-11-23 MED ORDER — ACETAMINOPHEN 325 MG PO TABS: 650.0000 mg | ORAL_TABLET | Freq: Four times a day (QID) | ORAL | Status: DC | PRN

## 2023-11-23 MED ORDER — LACTATED RINGERS IV BOLUS
1000.0000 mL | Freq: Once | INTRAVENOUS | Status: AC
  Administered 2023-11-23: 1000 mL via INTRAVENOUS

## 2023-11-23 MED ORDER — ONDANSETRON HCL 4 MG/2ML IJ SOLN: 4.0000 mg | Freq: Four times a day (QID) | INTRAMUSCULAR | Status: DC | PRN

## 2023-11-23 MED ORDER — INSULIN ASPART 100 UNIT/ML IJ SOLN
0.0000 [IU] | Freq: Three times a day (TID) | INTRAMUSCULAR | Status: DC
  Administered 2023-11-23 – 2023-11-24 (×2): 3 [IU] via SUBCUTANEOUS
  Administered 2023-11-24: 2 [IU] via SUBCUTANEOUS
  Administered 2023-11-24: 3 [IU] via SUBCUTANEOUS
  Administered 2023-11-25: 8 [IU] via SUBCUTANEOUS
  Administered 2023-11-25: 3 [IU] via SUBCUTANEOUS
  Administered 2023-11-26: 5 [IU] via SUBCUTANEOUS
  Administered 2023-11-26: 3 [IU] via SUBCUTANEOUS

## 2023-11-23 NOTE — Plan of Care (Signed)
  Problem: Education: Goal: Ability to describe self-care measures that may prevent or decrease complications (Diabetes Survival Skills Education) will improve 11/23/2023 1651 by Derk Pump, RN Outcome: Progressing 11/23/2023 1651 by Derk Pump, RN Outcome: Progressing Goal: Individualized Educational Video(s) Outcome: Progressing   Problem: Coping: Goal: Ability to adjust to condition or change in health will improve Outcome: Progressing   Problem: Fluid Volume: Goal: Ability to maintain a balanced intake and output will improve Outcome: Progressing   Problem: Health Behavior/Discharge Planning: Goal: Ability to identify and utilize available resources and services will improve Outcome: Progressing Goal: Ability to manage health-related needs will improve Outcome: Progressing   Problem: Metabolic: Goal: Ability to maintain appropriate glucose levels will improve Outcome: Progressing   Problem: Nutritional: Goal: Maintenance of adequate nutrition will improve Outcome: Progressing Goal: Progress toward achieving an optimal weight will improve Outcome: Progressing   Problem: Skin Integrity: Goal: Risk for impaired skin integrity will decrease Outcome: Progressing   Problem: Tissue Perfusion: Goal: Adequacy of tissue perfusion will improve Outcome: Progressing   Problem: Education: Goal: Knowledge of General Education information will improve Description: Including pain rating scale, medication(s)/side effects and non-pharmacologic comfort measures Outcome: Progressing   Problem: Health Behavior/Discharge Planning: Goal: Ability to manage health-related needs will improve Outcome: Progressing   Problem: Clinical Measurements: Goal: Ability to maintain clinical measurements within normal limits will improve Outcome: Progressing Goal: Will remain free from infection Outcome: Progressing Goal: Diagnostic test results will improve Outcome: Progressing Goal:  Respiratory complications will improve Outcome: Progressing Goal: Cardiovascular complication will be avoided Outcome: Progressing   Problem: Activity: Goal: Risk for activity intolerance will decrease Outcome: Progressing   Problem: Nutrition: Goal: Adequate nutrition will be maintained Outcome: Progressing   Problem: Coping: Goal: Level of anxiety will decrease Outcome: Progressing   Problem: Elimination: Goal: Will not experience complications related to bowel motility Outcome: Progressing Goal: Will not experience complications related to urinary retention Outcome: Progressing   Problem: Pain Managment: Goal: General experience of comfort will improve and/or be controlled Outcome: Progressing   Problem: Safety: Goal: Ability to remain free from injury will improve Outcome: Progressing   Problem: Skin Integrity: Goal: Risk for impaired skin integrity will decrease Outcome: Progressing

## 2023-11-23 NOTE — H&P (Signed)
 History and Physical  Cory [name] FMW:[id number] DOB: November 03, 1945 DOA: 11/23/2023  PCP: Jerrye Lamar Cory [name]   Chief Complaint: Vomiting  HPI: Cory [name] is a 78 y.o. 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  Patient states he was in his usual state of health until 2 days ago, when he had an episode of vomiting, he has had reduced oral intake since that time.  He was overall doing well yesterday, but today he had another episode of emesis and came to the ER for evaluation.  He denies any fevers, abdominal pain, or diarrhea.  He had a large bowel movement yesterday, says that his bowels typically alternate between constipation and loose movements.  Evaluation in the emergency department is relatively benign, though he was noted to have some acute on chronic kidney injury.  He was given a liter of fluid, and hospitalist admission was requested.  Review of Systems: Please see HPI for pertinent positives and negatives. A complete 10 system review of systems are otherwise negative.  Past Medical History:  Diagnosis Date   Atrial flutter (HCC)    BPH (benign prostatic hyperplasia)    CKD (chronic kidney disease), stage III (HCC)    Colon cancer (HCC)    Diabetes mellitus without complication (HCC)    DM (diabetes mellitus), type 2 (HCC)    HLD (hyperlipidemia)    HTN (hypertension)    Hx of right BKA (HCC)    Hyperlipidemia    Hypertension    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   Prostate CA (HCC)    Prostate CA (HCC)    Sepsis (HCC) 11/27/2020   Past Surgical History:  Procedure Laterality Date   AMPUTATION Right 12/04/2020   Procedure: RIGHT BELOW KNEE AMPUTATION;  Surgeon: [name], [name];  Location: [facility] OR;  Service: Orthopedics;  Laterality: Right;    APPENDECTOMY     APPLICATION OF WOUND VAC Right 12/04/2020   Procedure: APPLICATION OF WOUND VAC;  Surgeon: [name], [name];  Location: [facility] OR;  Service: Orthopedics;  Laterality: Right;   Social History:  reports that he has quit smoking. His smoking use included cigarettes. He has never used smokeless tobacco. He reports that he does not drink alcohol and does not use drugs.  Allergies  Allergen Reactions   Fish Allergy Other (See Comments)    No scaled fish!!   Pork-Derived Products Other (See Comments)    Will not eat pork and religious reasons   Shellfish Allergy Other (See Comments)    Will not eat shellfish   Pumpkin Seed Oil Rash    Family History  Problem Relation Age of Onset   Diabetes Mother    Hypertension Mother    Diabetes Father    Hypertension Father    Cancer Sister 79       breast cancer     Prior to Admission medications   Medication Sig Start Date End Date Taking? Authorizing Provider  apixaban  (ELIQUIS ) 5 MG TABS tablet Take 5 mg by mouth 2 (two) times daily.   Yes Provider, Historical, [name]  acetaminophen  (TYLENOL ) 325 MG tablet Take 2 tablets (650 mg total) by mouth every 6 (six) hours as needed for mild pain, headache or fever (or Fever >/= 101). 12/07/20   Gonfa, Taye T, [name]  amLODipine  (NORVASC ) 5  MG tablet Take 1 tablet (5 mg total) by mouth daily. 09/30/23   Odell Celinda Balo, [name]  atorvastatin  (LIPITOR) 20 MG tablet Take 20 mg by mouth at bedtime. 06/27/23   Provider, Historical, [name]  carvedilol  (COREG ) 25 MG tablet Take 25 mg by mouth 2 (two) times daily with a meal. 03/16/23   Provider, Historical, [name]  insulin  lispro (HUMALOG) 100 UNIT/ML injection Inject 0-12 Units into the skin with breakfast, with lunch, and with evening meal.    Provider, Historical, [name]  isosorbide -hydrALAZINE  (BIDIL ) 20-37.5 MG tablet Take 2 tablets by mouth 3 (three) times daily. Patient not taking: Reported on 11/21/2023 09/29/23   Odell Celinda Balo, [name]  LANTUS  SOLOSTAR 100  UNIT/ML Solostar Pen Inject 15 Units into the skin at bedtime. 10/21/23   Danford, Lonni SQUIBB, [name]  pantoprazole  (PROTONIX ) 40 MG tablet Take 40 mg by mouth 2 (two) times daily. 06/09/23   Provider, Historical, [name]  polyethylene glycol (MIRALAX  / GLYCOLAX ) 17 g packet Take 17 g by mouth daily as needed. 05/22/23   Elgergawy, Brayton RAMAN, [name]  Potassium Chloride  ER 20 MEQ TBCR Take 20 mEq by mouth daily. 08/03/23   Provider, Historical, [name]  sertraline  (ZOLOFT ) 50 MG tablet Take 50 mg by mouth daily. 09/18/23   Provider, Historical, [name]  tamsulosin  (FLOMAX ) 0.4 MG CAPS capsule Take 0.4 mg by mouth daily after supper. 06/27/23   Provider, Historical, [name]  telmisartan  (MICARDIS ) 40 MG tablet Take 40 mg by mouth daily. 11/15/23   Provider, Historical, [name]  torsemide  (DEMADEX ) 20 MG tablet Take 20 mg by mouth daily. 09/05/23   Provider, Historical, [name]  TRULICITY  0.75 MG/0.5ML SOAJ Inject 0.75 mg into the skin once a week. Tuesday's 10/10/23   Provider, Historical, [name]  Vitamin D, Ergocalciferol, (DRISDOL) 1.25 MG (50000 UNIT) CAPS capsule Take 50,000 Units by mouth once a week. 04/05/23   Provider, Historical, [name]  furosemide  (LASIX ) 20 MG tablet Take 1-2 tablets in the morning for your ankle and foot swelling. Patient not taking: Reported on 11/18/2020 11/12/20 11/27/20  Rolinda Rogue, [name]    Physical Exam: BP 125/77 (BP Location: Right Arm)   Pulse 62   Temp 98 F (36.7 C) (Oral)   Resp 20   SpO2 100%  General:  Alert, oriented, calm, in no acute distress, resting comfortably Cardiovascular: RRR, no murmurs or rubs, no peripheral edema  Respiratory: clear to auscultation bilaterally, no wheezes, no crackles  Abdomen: soft, nontender, nondistended, normal bowel tones heard  Skin: dry, no rashes  Musculoskeletal: no joint effusions, normal range of motion, status post right BKA Psychiatric: appropriate affect, normal speech  Neurologic: extraocular muscles intact, clear speech, moving all extremities with intact  sensorium         Labs on Admission:  Basic Metabolic Panel: Recent Labs  Lab 11/23/23 1217  NA 133*  K 5.3*  CL 98  CO2 25  GLUCOSE 96  BUN 42*  CREATININE 3.06*  CALCIUM  9.1   Liver Function Tests: Recent Labs  Lab 11/23/23 1217  AST 16  ALT 15  ALKPHOS 64  BILITOT 0.7  PROT 7.4  ALBUMIN 3.6   Recent Labs  Lab 11/23/23 1217  LIPASE 28   No results for input(s): AMMONIA in the last 168 hours. CBC: Recent Labs  Lab 11/23/23 1217  WBC 4.0  HGB 11.9*  HCT 36.5*  MCV 81.7  PLT 188   Cardiac Enzymes: No results for input(s): CKTOTAL, CKMB, CKMBINDEX, TROPONINI in the last 168 hours. BNP (last  3 results) No results for input(s): BNP in the last 8760 hours.  ProBNP (last 3 results) No results for input(s): PROBNP in the last 8760 hours.  CBG: Recent Labs  Lab 11/23/23 1224  GLUCAP 100*    Radiological Exams on Admission: CT ABDOMEN PELVIS WO CONTRAST Result Date: 11/23/2023 CLINICAL DATA:  Bowel obstruction, nausea: History of sigmoid colon cancer. EXAM: CT ABDOMEN AND PELVIS WITHOUT CONTRAST TECHNIQUE: Multidetector CT imaging of the abdomen and pelvis was performed following the standard protocol without IV contrast. RADIATION DOSE REDUCTION: This exam was performed according to the departmental dose-optimization program which includes automated exposure control, adjustment of the mA and/or kV according to patient size and/or use of iterative reconstruction technique. COMPARISON:  CT chest abdomen pelvis Sep 18, 2023, abdomen radiograph Sep 18 2023. FINDINGS: Evaluation is suboptimal given lack of contrast. No punctate calcifications in the left kidney lower Lower chest: Calcified nodule in left lower lobe perifissural suggestive of prior granulomatous disease. Dendritic ossifications in bilateral lung base subpleural likely due to chronic aspiration. Left thyroid lobe subcentimeter nodule. Endotracheal tube in proper position. Normal splenic  Hepatobiliary: Left hepatic lobe few hypodense lesions are stable to prior. Gallbladder appears unremarkable. No biliary dilatation Pancreas: Pancreas is normal in appearance. No pancreatic ductal dilatation. Spleen: Normal size multiple hypodense. Hypodense splenic lesion measuring 1.7 cm, stable to prior. Smaller subcentimeter lesions in lower pole not conspicuous on current noncontrast CT. Adrenals/Urinary Tract: Punctate calcifications in left kidney lower pole likely vascular. No hydronephrosis. Bilateral adrenal glands are within normal limits. Bladder is under distended and slightly thick wall. Stomach/Bowel: Normal stomach. Interval removal of enteric tube. Bowel loops including sigmoid colon appear unremarkable and within normal limits for noncontrast CT without evidence of abnormal dilation or suspicious mass or bowel obstruction. Previously seen wall thickening of the sigmoid colon is not visualized on current exam. Appendix is normal. Vascular/Lymphatic: Atherosclerotic calcifications of aorta and iliac arteries. No aneurysm. No suspicious lymphadenopathy. No ascites. Reproductive: Prostatomegaly.  Normal seminal vesicles. Other: Small fat containing umbilical hernia A tiny left inguinal fat containing hernia. Musculoskeletal: No suspicious osseous metastasis. Degenerative changes of the spine and bilateral hip joints. IMPRESSION: No bowel obstruction.Normal noncontrast CT appearance of bowel loops including sigmoid colon. No suspicious findings to suggest nodal or distant metastasis. Redemonstration of stable hepatic and splenic hypodense lesions incompletely assessed on current noncontrast CT, likely representing cysts or hemangiomas. Electronically Signed   By: Megan  Zare M.D.   On: 11/23/2023 14:16   Assessment/Plan Cory [name] is a 78 y.o. male with medical history significant for atrial flutter on Eliquis , CKD stage III, insulin -dependent type 2 diabetes, hypertension, history of right BKA due  to calcaneal osteomyelitis being admitted to the hospital with AKI as a result of vomiting.   AKI superimposed on CKD stage III-likely due to volume loss from vomiting. -Observation admission -Gentle IV fluids -Avoid nephrotoxins, including diuretics  Hyperkalemia-very mild, likely due to AKI as well as potassium supplementation at home for his diuretics -Check EKG  Vomiting-unclear etiology, no obstruction or other acute findings on CT scan, likely viral -Supportive care with gentle IV fluids, nausea medication as needed -Patient is currently hungry, will allow diet as tolerated  Insulin -dependent type 2 diabetes-last hemoglobin A1c 9.6 which is gradually improving -Carb modified diet -Continue home Lantus  15 units nightly -Moderate dose sliding scale  Atrial flutter-continue Eliquis   Hyperlipidemia-Lipitor  Depression-Zoloft   Constipation-MiraLAX  as needed  GERD-Protonix     Code Status: Full Code  Consults called: None  Admission status: Observation  Time spent: 48 minutes  Legna Mausolf Cory Gail [name] [facility] Pager [phone]  If 7PM-7AM, please contact night-coverage [email or web address] Password [email or web address]  11/23/2023, 4:00 PM

## 2023-11-23 NOTE — ED Provider Notes (Signed)
 [place] EMERGENCY DEPARTMENT AT [facility] Provider Note   CSN: [id number] Arrival date & time: 11/23/23  1146     Patient presents with: Nausea   Cory [name] is a 78 y.o. male.   HPI Patient presents with nausea and vomiting.  Has had for around 3 days.  States had passed little blood in the stool.  History of colon cancer.  History of chronic kidney disease.  States has not been able to eat anything.  Colon cancer has not been treated.   Past Medical History:  Diagnosis Date   Atrial flutter (HCC)    BPH (benign prostatic hyperplasia)    CKD (chronic kidney disease), stage III (HCC)    Colon cancer (HCC)    Diabetes mellitus without complication (HCC)    DM (diabetes mellitus), type 2 (HCC)    HLD (hyperlipidemia)    HTN (hypertension)    Hx of right BKA (HCC)    Hyperlipidemia    Hypertension    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   Prostate CA (HCC)    Prostate CA (HCC)    Sepsis (HCC) 11/27/2020    Prior to Admission medications   Medication Sig Start Date End Date Taking? Authorizing Provider  acetaminophen  (TYLENOL ) 325 MG tablet Take 2 tablets (650 mg total) by mouth every 6 (six) hours as needed for mild pain, headache or fever (or Fever >/= 101). 12/07/20   Gonfa, Taye T, MD  amLODipine  (NORVASC ) 5 MG tablet Take 1 tablet (5 mg total) by mouth daily. 09/30/23   Odell Celinda Balo, MD  apixaban  (ELIQUIS ) 5 MG TABS tablet Take 5 mg by mouth 2 (two) times daily.    [provider]  atorvastatin  (LIPITOR) 20 MG tablet Take 20 mg by mouth at bedtime. 06/27/23   [provider]  carvedilol  (COREG ) 25 MG tablet Take 25 mg by mouth 2 (two) times daily with a meal. 03/16/23   [provider]  insulin  lispro (HUMALOG) 100 UNIT/ML injection Inject 0-12 Units into the skin with breakfast, with lunch, and with evening meal.    [provider]  isosorbide -hydrALAZINE  (BIDIL ) 20-37.5 MG tablet Take 2 tablets by mouth 3  (three) times daily. Patient not taking: Reported on 11/21/2023 09/29/23   Odell Celinda Balo, MD  LANTUS  SOLOSTAR 100 UNIT/ML Solostar Pen Inject 15 Units into the skin at bedtime. 10/21/23   Danford, Lonni SQUIBB, MD  pantoprazole  (PROTONIX ) 40 MG tablet Take 40 mg by mouth 2 (two) times daily. 06/09/23   [provider]  polyethylene glycol (MIRALAX  / GLYCOLAX ) 17 g packet Take 17 g by mouth daily as needed. 05/22/23   Elgergawy, Brayton RAMAN, MD  Potassium Chloride  ER 20 MEQ TBCR Take 20 mEq by mouth daily. 08/03/23   [provider]  sertraline  (ZOLOFT ) 50 MG tablet Take 50 mg by mouth daily. 09/18/23   [provider]  tamsulosin  (FLOMAX ) 0.4 MG CAPS capsule Take 0.4 mg by mouth daily after supper. 06/27/23   [provider]  telmisartan  (MICARDIS ) 40 MG tablet Take 40 mg by mouth daily. 11/15/23   [provider]  torsemide  (DEMADEX ) 20 MG tablet Take 20 mg by mouth daily. 09/05/23   [provider]  TRULICITY  0.75 MG/0.5ML SOAJ Inject 0.75 mg into the skin once a week. Tuesday's 10/10/23   [provider]  Vitamin D, Ergocalciferol, (DRISDOL) 1.25 MG (50000 UNIT) CAPS capsule Take 50,000 Units by mouth once a week. 04/05/23   [provider]  furosemide  (LASIX ) 20 MG tablet Take 1-2 tablets in the morning for your ankle and foot swelling. Patient not taking: Reported on 11/18/2020 11/12/20 11/27/20  Rolinda Rogue, MD    Allergies: Pork-derived products and Shellfish allergy    Review of Systems  Updated Vital Signs BP 125/77 (BP Location: Right Arm)   Pulse 62   Temp 98 F (36.7 C) (Oral)   Resp 20   SpO2 100%   Physical Exam Vitals and nursing note reviewed.  Cardiovascular:     Rate and Rhythm: Normal rate and regular rhythm.  Pulmonary:     Breath sounds: No wheezing.  Abdominal:     Comments: Some fullness on the left side of abdomen.  Skin:    Capillary Refill: Capillary refill takes less than 2 seconds.   Neurological:     Mental Status: He is alert and oriented to person, place, and time.     (all labs ordered are listed, but only abnormal results are displayed) Labs Reviewed  COMPREHENSIVE METABOLIC PANEL WITH GFR - Abnormal; Notable for the following components:      Result Value   Sodium 133 (*)    Potassium 5.3 (*)    BUN 42 (*)    Creatinine, Ser 3.06 (*)    GFR, Estimated 20 (*)    All other components within normal limits  CBC - Abnormal; Notable for the following components:   Hemoglobin 11.9 (*)    HCT 36.5 (*)    RDW 16.8 (*)    All other components within normal limits  CBG MONITORING, ED - Abnormal; Notable for the following components:   Glucose-Capillary 100 (*)    All other components within normal limits  LIPASE, BLOOD  URINALYSIS, ROUTINE W REFLEX MICROSCOPIC    EKG: None  Radiology: CT ABDOMEN PELVIS WO CONTRAST Result Date: 11/23/2023 CLINICAL DATA:  Bowel obstruction, nausea: History of sigmoid colon cancer. EXAM: CT ABDOMEN AND PELVIS WITHOUT CONTRAST TECHNIQUE: Multidetector CT imaging of the abdomen and pelvis was performed following the standard protocol without IV contrast. RADIATION DOSE REDUCTION: This exam was performed according to the departmental dose-optimization program which includes automated exposure control, adjustment of the mA and/or kV according to patient size and/or use of iterative reconstruction technique. COMPARISON:  CT chest abdomen pelvis Sep 18, 2023, abdomen radiograph Sep 18 2023. FINDINGS: Evaluation is suboptimal given lack of contrast. No punctate calcifications in the left kidney lower Lower chest: Calcified nodule in left lower lobe perifissural suggestive of prior granulomatous disease. Dendritic ossifications in bilateral lung base subpleural likely due to chronic aspiration. Left thyroid lobe subcentimeter nodule. Endotracheal tube in proper position. Normal splenic Hepatobiliary: Left hepatic lobe few hypodense lesions are  stable to prior. Gallbladder appears unremarkable. No biliary dilatation Pancreas: Pancreas is normal in appearance. No pancreatic ductal dilatation. Spleen: Normal size multiple hypodense. Hypodense splenic lesion measuring 1.7 cm, stable to prior. Smaller subcentimeter lesions in lower pole not conspicuous on current noncontrast CT. Adrenals/Urinary Tract: Punctate calcifications in left kidney lower pole likely vascular. No hydronephrosis. Bilateral adrenal glands are within normal limits. Bladder is under distended and slightly thick wall. Stomach/Bowel: Normal stomach. Interval removal of enteric tube. Bowel loops including sigmoid colon appear unremarkable and within normal limits for noncontrast CT without evidence of abnormal dilation or suspicious mass or bowel obstruction. Previously seen wall thickening of the sigmoid colon is not visualized on current exam. Appendix is normal. Vascular/Lymphatic: Atherosclerotic calcifications of aorta and iliac arteries. No aneurysm. No suspicious lymphadenopathy. No  ascites. Reproductive: Prostatomegaly.  Normal seminal vesicles. Other: Small fat containing umbilical hernia A tiny left inguinal fat containing hernia. Musculoskeletal: No suspicious osseous metastasis. Degenerative changes of the spine and bilateral hip joints. IMPRESSION: No bowel obstruction.Normal noncontrast CT appearance of bowel loops including sigmoid colon. No suspicious findings to suggest nodal or distant metastasis. Redemonstration of stable hepatic and splenic hypodense lesions incompletely assessed on current noncontrast CT, likely representing cysts or hemangiomas. Electronically Signed   By: Megan  Zare M.D.   On: 11/23/2023 14:16     Procedures   Medications Ordered in the ED  lactated ringers  bolus 1,000 mL (1,000 mLs Intravenous New Bag/Given 11/23/23 1400)  ondansetron  (ZOFRAN ) injection 4 mg (4 mg Intravenous Given 11/23/23 1400)                                    Medical  Decision Making Amount and/or Complexity of Data Reviewed Labs: ordered. Radiology: ordered.  Risk Prescription drug management. Decision regarding hospitalization.   With nausea and vomiting.  Decreased bowel movements.  History of colon cancer.  Differential diagnosis does include causes such as gastroenteritis, but also obstruction prickly with his colon cancer.  Creatinine now increased from under 2 to now up to 3.  Also BUN up to 40.  Potassium is mildly increased.  Reviewed recent discharge note.  Will get CT scan to evaluate for obstruction.  Will give fluids and antiemetics.  Will require admission to hospital.  CT scan does not show obstruction.  Feeling somewhat better and requesting some oral intake.  However with the acute kidney injury I feel would benefit from admission to the hospital.  Will discuss with hospitalist      Final diagnoses:  AKI (acute kidney injury) [facility])    ED Discharge Orders     None          Patsey Lot, MD 11/23/23 1514

## 2023-11-23 NOTE — ED Triage Notes (Signed)
 Patient in today from home via EMS reporting ongoing nausea. 1 episode of vomiting yesterday.

## 2023-11-24 DIAGNOSIS — N179 Acute kidney failure, unspecified: Secondary | ICD-10-CM | POA: Diagnosis not present

## 2023-11-24 LAB — URINALYSIS, ROUTINE W REFLEX MICROSCOPIC
Bilirubin Urine: NEGATIVE
Glucose, UA: 50 mg/dL — AB
Hgb urine dipstick: NEGATIVE
Ketones, ur: NEGATIVE mg/dL
Nitrite: NEGATIVE
Protein, ur: 100 mg/dL — AB
Specific Gravity, Urine: 1.011 (ref 1.005–1.030)
pH: 7 (ref 5.0–8.0)

## 2023-11-24 LAB — CBC
HCT: 32.8 % — ABNORMAL LOW (ref 39.0–52.0)
Hemoglobin: 10.8 g/dL — ABNORMAL LOW (ref 13.0–17.0)
MCH: 26.9 pg (ref 26.0–34.0)
MCHC: 32.9 g/dL (ref 30.0–36.0)
MCV: 81.8 fL (ref 80.0–100.0)
Platelets: 174 K/uL (ref 150–400)
RBC: 4.01 MIL/uL — ABNORMAL LOW (ref 4.22–5.81)
RDW: 16.6 % — ABNORMAL HIGH (ref 11.5–15.5)
WBC: 3.7 K/uL — ABNORMAL LOW (ref 4.0–10.5)
nRBC: 0 % (ref 0.0–0.2)

## 2023-11-24 LAB — BASIC METABOLIC PANEL WITH GFR
Anion gap: 9 (ref 5–15)
BUN: 36 mg/dL — ABNORMAL HIGH (ref 8–23)
CO2: 23 mmol/L (ref 22–32)
Calcium: 9 mg/dL (ref 8.9–10.3)
Chloride: 103 mmol/L (ref 98–111)
Creatinine, Ser: 2.22 mg/dL — ABNORMAL HIGH (ref 0.61–1.24)
GFR, Estimated: 30 mL/min — ABNORMAL LOW (ref >60)
Glucose, Bld: 179 mg/dL — ABNORMAL HIGH (ref 70–99)
Potassium: 5.1 mmol/L (ref 3.5–5.1)
Sodium: 135 mmol/L (ref 135–145)

## 2023-11-24 LAB — GLUCOSE, CAPILLARY
Glucose-Capillary: 152 mg/dL — ABNORMAL HIGH (ref 70–99)
Glucose-Capillary: 162 mg/dL — ABNORMAL HIGH (ref 70–99)
Glucose-Capillary: 144 mg/dL — ABNORMAL HIGH (ref 70–99)
Glucose-Capillary: 107 mg/dL — ABNORMAL HIGH (ref 70–99)

## 2023-11-24 NOTE — TOC Initial Note (Signed)
 Transition of Care [facility]) - Initial/Assessment Note    Patient Details  Name: Cory [name] MRN: [id number] Date of Birth: 1945/06/19  [facility]) CM/SW Contact:    [name], RN Phone Number: 11/24/2023, 3:24 PM  Clinical Narrative:  d/c plan home. R BKA. Has own transport home(uber).                 Expected Discharge Plan: Home/Self Care Barriers to Discharge: Continued Medical Work up   Patient Goals and CMS Choice Patient states their goals for this hospitalization and ongoing recovery are:: Home CMS Medicare.gov Compare Post Acute Care list provided to:: Patient Choice offered to / list presented to : Patient [place] ownership interest in [facility].provided to:: Patient    Expected Discharge Plan and Services   Discharge Planning Services: CM Consult Post Acute Care Choice: Resumption of Svcs/PTA Provider Living arrangements for the past 2 months: Single Family Home                                      Prior Living Arrangements/Services Living arrangements for the past 2 months: Single Family Home Lives with:: Self              Current home services: DME (rw;w/c)    Activities of Daily Living   ADL Screening (condition at time of admission) Independently performs ADLs?: Yes (appropriate for developmental age) Is the patient deaf or have difficulty hearing?: No Does the patient have difficulty seeing, even when wearing glasses/contacts?: No Does the patient have difficulty concentrating, remembering, or making decisions?: No  Permission Sought/Granted                  Emotional Assessment              Admission diagnosis:  AKI (acute kidney injury) (HCC) [N17.9] Patient Active Problem List   Diagnosis Date Noted   Stroke (HCC) 11/21/2023   Syncope and collapse 10/19/2023   HFrEF (heart failure with reduced ejection fraction) (HCC) 09/25/2023   Iron  deficiency anemia 09/21/2023   Left heart failure  (HCC) 09/21/2023   Left ventricular failure (HCC) 09/21/2023   Pressure injury of skin 09/21/2023   Acute encephalopathy 09/18/2023   CKD stage 3b, GFR 30-44 ml/min (HCC) 08/14/2023   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   IDA (iron  deficiency anemia) 08/14/2023   Decubitus ulcer of sacral region, stage 2 (HCC) 08/14/2023   H/O urinary incontinence 08/14/2023   AMS (altered mental status) 05/09/2023   Chronic anticoagulation 05/08/2023   History of prostate cancer 04/26/2023   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   Renal insufficiency 04/26/2023   Cancer of splenic flexure (HCC) 04/19/2023   Syncope 02/16/2023   Atrial flutter with rapid ventricular response (HCC) 12/27/2022   UTI (urinary tract infection) 12/27/2022   Acute metabolic encephalopathy 12/27/2022   Elevated troponin 12/27/2022   Elevated lipase 12/27/2022   Generalized weakness 12/27/2022   Type 2 diabetes mellitus with chronic kidney disease, with long-term current use of insulin  (HCC) 12/26/2022   AKI (acute kidney injury) (HCC) 12/26/2022   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/02/2021   Moderate protein-calorie malnutrition (HCC)    Septic shock (HCC) 11/27/2020   Overweight (BMI 25.0-29.9) 11/27/2020   Hyperlipidemia 11/26/2020   Hyperglycemia 11/26/2020   Hypertension 11/26/2020   PCP:  Cory Lamar HERO  [name]., MD Pharmacy:   [facility] [address] [place] - [address] [place] [address] Phone: [phone] Fax: [phone]     Social Drivers of Health (SDOH) Social History: SDOH Screenings   Food Insecurity: Food Insecurity Present (11/23/2023)  Housing: High Risk (11/23/2023)  Transportation Needs: No Transportation Needs (11/23/2023)  Utilities: At Risk (11/23/2023)  Depression (PHQ2-9): Low Risk  (10/09/2023)  Social Connections: Moderately Integrated (11/23/2023)  Recent Concern: Social Connections -  Moderately Isolated (10/19/2023)  Tobacco Use: Medium Risk (11/23/2023)   SDOH Interventions:     Readmission Risk Interventions     No data to display

## 2023-11-24 NOTE — Progress Notes (Signed)
 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 Patient states he was in his usual state of health until 2 days ago, when he had developed vomiting, he has had reduced oral intake since that time.    Evaluation in the emergency department is relatively benign, though he was noted to have some acute on chronic kidney injury.  Patient was admitted for further evaluation.  Assessment & Plan:   Principal Problem:   AKI (acute kidney injury) (HCC)  AKI on CKD stage IIIa; Likely due to volume loss from vomiting. Baseline serum creatinine remains between 1.6-1.8 Creatinine on admission > 3.06. Continue gentle fluid resuscitation. Avoid nephrotoxins, including diuretics. Serum creatinine improving.  3.06 > 2.22   Hyperkalemia: Likely due to AKI as well as potassium supplementation at home for his diuretics. EKG shows atrial flutter.  Rate controlled. Continue to monitor serum potassium.  Nausea and Vomiting : Unclear etiology, no obstruction or other acute findings on CT scan, likely viral. Continue Supportive care with gentle IV fluids, nausea medication as needed. Clear liquid diet and advance as tolerated.   Insulin -dependent type 2 diabetes: Last hemoglobin A1c 9.6 which is gradually improving. Carb modified diet. Continue home Lantus  15 units nightly Moderate dose sliding scale.   Atrial flutter: Continue Eliquis . HR well controlled.   Hyperlipidemia: Continue Lipitor   Depression: Continue Zoloft    Constipation: Continue MiraLAX  as needed   GERD: Continue Protonix     DVT prophylaxis: Eliquis  Code Status:Full  code Family Communication: No family at bed side. Disposition Plan:    Status is: Observation The patient remains OBS appropriate and will d/c before 2 midnights.   Admitted for intractable nausea and vomiting leading to acute kidney injury which is now improving with IV hydration.  Consultants:  None  Procedures: None  Antimicrobials:  Anti-infectives (From admission, onward)    None       Subjective: Patient was seen and examined at bedside.  Overnight events noted. Patient was sitting comfortably on the chair,  feeling much improved . He still reports having some nausea but vomiting has improved.  Objective: Vitals:   11/23/23 1959 11/24/23 0015 11/24/23 0352 11/24/23 0803  BP: (!) 144/77 124/82 122/79 122/73  Pulse: 62 64 61 65  Resp: 16 17 17 16   Temp: (!) 97.4 F (36.3 C) 98.3 F (36.8 C) 97.9 F (36.6 C) 97.9 F (36.6 C)  TempSrc: Oral Oral Oral Oral  SpO2: 100% 100% 100% 100%  Weight:      Height:        Intake/Output Summary (Last 24 hours) at 11/24/2023 1036 Last data filed at 11/24/2023 9187 Gross per 24 hour  Intake 1128.65 ml  Output 700 ml  Net 428.65 ml   Filed Weights   11/23/23 1630  Weight: 83.3 kg    Examination:  General exam: Appears calm and comfortable, not in any acute distress. Respiratory system: Clear to auscultation. Respiratory effort normal.  RR 16 Cardiovascular system: S1 & S2 heard, RRR. No JVD, murmurs, rubs, gallops or clicks.  Gastrointestinal system: Abdomen is non distended, soft and non tender. Normal bowel sounds heard. Central nervous system: Alert and oriented x 3.  No focal neurological deficits. Extremities: Right BKA , No edema, No cyanosis , No clubbing Skin: No rashes, lesions or ulcers Psychiatry: Judgement and insight appear normal. Mood & affect appropriate.     Data Reviewed: I have personally reviewed following labs and imaging studies  CBC: Recent Labs  Lab 11/23/23 1217 11/24/23 0456  WBC 4.0  3.7*  HGB 11.9* 10.8*  HCT 36.5* 32.8*  MCV 81.7 81.8  PLT 188 174   Basic Metabolic Panel: Recent Labs  Lab 11/23/23 1217 11/24/23 0456  NA 133* 135  K 5.3* 5.1  CL 98 103  CO2 25 23  GLUCOSE 96 179*  BUN 42* 36*  CREATININE 3.06* 2.22*  CALCIUM  9.1 9.0   GFR: Estimated Creatinine Clearance: 30.2 mL/min (A) (by C-G formula based on SCr of 2.22 mg/dL (H)). Liver Function Tests: Recent Labs  Lab 11/23/23 1217  AST 16  ALT 15  ALKPHOS 64  BILITOT 0.7  PROT 7.4  ALBUMIN 3.6   Recent Labs  Lab 11/23/23 1217  LIPASE 28   No results for input(s): AMMONIA in the last 168 hours. Coagulation Profile: No results for input(s): INR, PROTIME in the last 168 hours. Cardiac Enzymes: No results for input(s): CKTOTAL, CKMB, CKMBINDEX, TROPONINI in the last 168 hours. BNP (last 3 results) No results for input(s): PROBNP in the last 8760 hours. HbA1C: No results for input(s): HGBA1C in the last 72 hours. CBG: Recent Labs  Lab 11/23/23 1224 11/23/23 1759 11/23/23 2020 11/24/23 0739  GLUCAP 100* 159* 283* 152*   Lipid Profile: No results for input(s): CHOL, HDL, LDLCALC, TRIG, CHOLHDL, LDLDIRECT in the last 72 hours. Thyroid Function Tests: No results for input(s): TSH, T4TOTAL, FREET4, T3FREE, THYROIDAB in the last 72 hours. Anemia Panel: No results for input(s): VITAMINB12, FOLATE, FERRITIN, TIBC, IRON , RETICCTPCT in the last 72 hours. Sepsis Labs: No results for input(s): PROCALCITON, LATICACIDVEN in the last 168 hours.  No results found for this or any previous visit (from the past 240 hours).   Radiology Studies: CT ABDOMEN PELVIS WO CONTRAST Result Date: 11/23/2023 CLINICAL DATA:  Bowel obstruction, nausea: History of sigmoid colon cancer. EXAM: CT ABDOMEN AND PELVIS WITHOUT CONTRAST TECHNIQUE: Multidetector CT imaging of the abdomen and pelvis was performed following the standard protocol without IV  contrast. RADIATION DOSE REDUCTION: This exam was performed according to the departmental dose-optimization program which includes automated exposure control, adjustment of the mA and/or kV according to patient size and/or use of iterative reconstruction technique. COMPARISON:  CT chest abdomen pelvis Sep 18, 2023, abdomen radiograph Sep 18 2023. FINDINGS: Evaluation is suboptimal given lack of contrast. No punctate calcifications in the left kidney lower Lower chest: Calcified nodule in left lower lobe perifissural suggestive of prior granulomatous disease. Dendritic ossifications in bilateral lung base subpleural likely due to chronic aspiration. Left thyroid lobe subcentimeter nodule. Endotracheal tube in proper position. Normal splenic Hepatobiliary: Left hepatic lobe few hypodense lesions are stable to prior. Gallbladder appears unremarkable. No biliary dilatation Pancreas: Pancreas is normal in appearance. No pancreatic ductal dilatation. Spleen: Normal size multiple hypodense. Hypodense splenic lesion measuring 1.7 cm, stable to prior. Smaller subcentimeter lesions in lower pole not conspicuous on current noncontrast CT. Adrenals/Urinary Tract: Punctate calcifications in left kidney lower pole likely vascular. No hydronephrosis. Bilateral adrenal glands are within normal limits. Bladder is under distended and slightly thick wall. Stomach/Bowel: Normal stomach. Interval removal of enteric tube. Bowel loops including sigmoid colon appear unremarkable and within normal limits for noncontrast CT without evidence  of abnormal dilation or suspicious mass or bowel obstruction. Previously seen wall thickening of the sigmoid colon is not visualized on current exam. Appendix is normal. Vascular/Lymphatic: Atherosclerotic calcifications of aorta and iliac arteries. No aneurysm. No suspicious lymphadenopathy. No ascites. Reproductive: Prostatomegaly.  Normal seminal vesicles. Other: Small fat containing umbilical hernia A  tiny left inguinal fat containing hernia. Musculoskeletal: No suspicious osseous metastasis. Degenerative changes of the spine and bilateral hip joints. IMPRESSION: No bowel obstruction.Normal noncontrast CT appearance of bowel loops including sigmoid colon. No suspicious findings to suggest nodal or distant metastasis. Redemonstration of stable hepatic and splenic hypodense lesions incompletely assessed on current noncontrast CT, likely representing cysts or hemangiomas. Electronically Signed   By: Megan  Zare M.D.   On: 11/23/2023 14:16   Scheduled Meds:  apixaban   2.5 mg Oral BID   atorvastatin   20 mg Oral QHS   carvedilol   25 mg Oral BID WC   insulin  aspart  0-15 Units Subcutaneous TID WC   insulin  aspart  0-5 Units Subcutaneous QHS   pantoprazole   40 mg Oral BID   sertraline   50 mg Oral Daily   tamsulosin   0.4 mg Oral QPC supper   Continuous Infusions:  sodium chloride  50 mL/hr at 11/24/23 0415     LOS: 0 days    Time spent: 50 mins    Darcel Dawley, MD [facility]   If 7PM-7AM, please contact night-coverage

## 2023-11-24 NOTE — Care Management Obs Status (Signed)
 MEDICARE OBSERVATION STATUS NOTIFICATION   Patient Details  Name: Cory [name] MRN: [id number] Date of Birth: January 21, 1946   Medicare Observation Status Notification Given:  Yes    [name], RN 11/24/2023, 3:22 PM

## 2023-11-24 NOTE — Progress Notes (Signed)
 Mobility Specialist - Progress Note   11/24/23 0900  Mobility  Activity Ambulated with assistance in hallway  Level of Assistance Standby assist, set-up cues, supervision of patient - no hands on  Assistive Device Front wheel walker  Distance Ambulated (ft) 500 ft  Range of Motion/Exercises Active  Activity Response Tolerated well  Mobility Referral Yes  Mobility visit 1 Mobility  Mobility Specialist Start Time (ACUTE ONLY) 0835  Mobility Specialist Stop Time (ACUTE ONLY) 0900  Mobility Specialist Time Calculation (min) (ACUTE ONLY) 25 min   Pt was found in bed and agreeable to ambulate. No complaints with session. At EOS returned to recliner chair with all needs met. Call bell in reach.  Erminio Leos,  Mobility Specialist Can be reached via Secure Chat

## 2023-11-25 DIAGNOSIS — E1122 Type 2 diabetes mellitus with diabetic chronic kidney disease: Secondary | ICD-10-CM | POA: Diagnosis present

## 2023-11-25 DIAGNOSIS — E785 Hyperlipidemia, unspecified: Secondary | ICD-10-CM | POA: Diagnosis present

## 2023-11-25 DIAGNOSIS — Z91014 Allergy to mammalian meats: Secondary | ICD-10-CM | POA: Diagnosis not present

## 2023-11-25 DIAGNOSIS — I129 Hypertensive chronic kidney disease with stage 1 through stage 4 chronic kidney disease, or unspecified chronic kidney disease: Secondary | ICD-10-CM | POA: Diagnosis present

## 2023-11-25 DIAGNOSIS — K5900 Constipation, unspecified: Secondary | ICD-10-CM | POA: Diagnosis present

## 2023-11-25 DIAGNOSIS — Z91013 Allergy to seafood: Secondary | ICD-10-CM | POA: Diagnosis not present

## 2023-11-25 DIAGNOSIS — N400 Enlarged prostate without lower urinary tract symptoms: Secondary | ICD-10-CM | POA: Diagnosis present

## 2023-11-25 DIAGNOSIS — Z87891 Personal history of nicotine dependence: Secondary | ICD-10-CM | POA: Diagnosis not present

## 2023-11-25 DIAGNOSIS — Z89511 Acquired absence of right leg below knee: Secondary | ICD-10-CM | POA: Diagnosis not present

## 2023-11-25 DIAGNOSIS — Z8249 Family history of ischemic heart disease and other diseases of the circulatory system: Secondary | ICD-10-CM | POA: Diagnosis not present

## 2023-11-25 DIAGNOSIS — C189 Malignant neoplasm of colon, unspecified: Secondary | ICD-10-CM | POA: Diagnosis present

## 2023-11-25 DIAGNOSIS — Z794 Long term (current) use of insulin: Secondary | ICD-10-CM | POA: Diagnosis not present

## 2023-11-25 DIAGNOSIS — N1831 Chronic kidney disease, stage 3a: Secondary | ICD-10-CM | POA: Diagnosis present

## 2023-11-25 DIAGNOSIS — Z7985 Long-term (current) use of injectable non-insulin antidiabetic drugs: Secondary | ICD-10-CM | POA: Diagnosis not present

## 2023-11-25 DIAGNOSIS — Z833 Family history of diabetes mellitus: Secondary | ICD-10-CM | POA: Diagnosis not present

## 2023-11-25 DIAGNOSIS — Z803 Family history of malignant neoplasm of breast: Secondary | ICD-10-CM | POA: Diagnosis not present

## 2023-11-25 DIAGNOSIS — I4892 Unspecified atrial flutter: Secondary | ICD-10-CM | POA: Diagnosis present

## 2023-11-25 DIAGNOSIS — Z7901 Long term (current) use of anticoagulants: Secondary | ICD-10-CM | POA: Diagnosis not present

## 2023-11-25 DIAGNOSIS — K219 Gastro-esophageal reflux disease without esophagitis: Secondary | ICD-10-CM | POA: Diagnosis present

## 2023-11-25 DIAGNOSIS — Z79899 Other long term (current) drug therapy: Secondary | ICD-10-CM | POA: Diagnosis not present

## 2023-11-25 DIAGNOSIS — E875 Hyperkalemia: Secondary | ICD-10-CM | POA: Diagnosis present

## 2023-11-25 DIAGNOSIS — F32A Depression, unspecified: Secondary | ICD-10-CM | POA: Diagnosis present

## 2023-11-25 DIAGNOSIS — Z8546 Personal history of malignant neoplasm of prostate: Secondary | ICD-10-CM | POA: Diagnosis not present

## 2023-11-25 DIAGNOSIS — R112 Nausea with vomiting, unspecified: Secondary | ICD-10-CM | POA: Diagnosis present

## 2023-11-25 DIAGNOSIS — N179 Acute kidney failure, unspecified: Secondary | ICD-10-CM | POA: Diagnosis present

## 2023-11-25 LAB — BASIC METABOLIC PANEL WITH GFR
Anion gap: 9 (ref 5–15)
BUN: 34 mg/dL — ABNORMAL HIGH (ref 8–23)
CO2: 23 mmol/L (ref 22–32)
Calcium: 9 mg/dL (ref 8.9–10.3)
Chloride: 101 mmol/L (ref 98–111)
Creatinine, Ser: 2.56 mg/dL — ABNORMAL HIGH (ref 0.61–1.24)
GFR, Estimated: 25 mL/min — ABNORMAL LOW (ref >60)
Glucose, Bld: 176 mg/dL — ABNORMAL HIGH (ref 70–99)
Potassium: 4.6 mmol/L (ref 3.5–5.1)
Sodium: 133 mmol/L — ABNORMAL LOW (ref 135–145)

## 2023-11-25 LAB — CBC
HCT: 34.2 % — ABNORMAL LOW (ref 39.0–52.0)
Hemoglobin: 11.1 g/dL — ABNORMAL LOW (ref 13.0–17.0)
MCH: 26.7 pg (ref 26.0–34.0)
MCHC: 32.5 g/dL (ref 30.0–36.0)
MCV: 82.2 fL (ref 80.0–100.0)
Platelets: 173 K/uL (ref 150–400)
RBC: 4.16 MIL/uL — ABNORMAL LOW (ref 4.22–5.81)
RDW: 16.6 % — ABNORMAL HIGH (ref 11.5–15.5)
WBC: 3.8 K/uL — ABNORMAL LOW (ref 4.0–10.5)
nRBC: 0 % (ref 0.0–0.2)

## 2023-11-25 LAB — GLUCOSE, CAPILLARY
Glucose-Capillary: 268 mg/dL — ABNORMAL HIGH (ref 70–99)
Glucose-Capillary: 93 mg/dL (ref 70–99)
Glucose-Capillary: 179 mg/dL — ABNORMAL HIGH (ref 70–99)
Glucose-Capillary: 224 mg/dL — ABNORMAL HIGH (ref 70–99)

## 2023-11-25 LAB — PHOSPHORUS: Phosphorus: 4.4 mg/dL (ref 2.5–4.6)

## 2023-11-25 LAB — MAGNESIUM: Magnesium: 2 mg/dL (ref 1.7–2.4)

## 2023-11-25 MED ORDER — SODIUM CHLORIDE 0.9 % IV SOLN: INTRAVENOUS | Status: DC

## 2023-11-25 NOTE — Progress Notes (Signed)
 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 Patient states he was in his usual state of health until 2 days ago, when he had developed vomiting, he has had reduced oral intake since that time.    Evaluation in the emergency department is relatively benign, though he was noted to have some acute on chronic kidney injury.  Patient was admitted for further evaluation.  Assessment & Plan:   Principal Problem:   AKI (acute kidney injury) (HCC)  AKI on CKD stage IIIa; Likely due to volume loss from vomiting. Baseline serum creatinine remains between 1.6-1.8 Creatinine on admission > 3.06. Serum creatinine improving.  3.06 > 2.22> slightly up today 2.56 Continue gentle fluid resuscitation. Avoid nephrotoxins, including diuretics.   Hyperkalemia: > Resolved. Likely due to AKI as well as potassium supplementation at home for his diuretics. EKG shows atrial flutter.  Rate controlled. Continue to monitor serum potassium.  Nausea and Vomiting : Unclear etiology, no obstruction or other acute findings on CT scan, likely viral. Continue Supportive care with gentle IV fluids, nausea medication as needed. Clear liquid diet and advance as tolerated.   Insulin -dependent type 2 diabetes: Last hemoglobin A1c 9.6 which is gradually improving. Carb modified diet. Continue home Lantus  15 units nightly Moderate dose sliding scale.   Atrial flutter: Continue Eliquis . HR well controlled.   Hyperlipidemia: Continue Lipitor   Depression: Continue Zoloft    Constipation: Continue MiraLAX  as needed   GERD: Continue Protonix .   DVT  prophylaxis: Eliquis  Code Status:Full code Family Communication: No family at bed side. Disposition Plan:   Status is: Inpatient Remains inpatient appropriate because:    Admitted for intractable nausea and vomiting leading to acute kidney injury which is now improving with IV hydration.  Consultants:  None  Procedures: None  Antimicrobials:  Anti-infectives (From admission, onward)    None       Subjective: Patient was seen and examined at bedside. Overnight events noted. Patient was sitting comfortably on the chair,  feeling much improved . He still reports having some nausea but vomiting has improved.  Objective: Vitals:   11/24/23 2031 11/24/23 2210 11/25/23 0508 11/25/23 0800  BP: 127/82  124/86   Pulse: 62  64   Resp: 18 20 18 16   Temp: 98.4 F (36.9 C)  98.2 F (36.8 C)   TempSrc: Oral  Oral   SpO2: 100%  100% 100%  Weight:      Height:        Intake/Output Summary (Last 24 hours) at 11/25/2023 1322 Last data filed at 11/25/2023 0500 Gross per 24 hour  Intake 360 ml  Output 1400 ml  Net -1040 ml   Filed Weights   11/23/23 1630  Weight: 83.3 kg    Examination:  General exam: Appears calm and comfortable, not in any acute distress. Respiratory system: Clear to auscultation. Respiratory effort normal.  RR 15 Cardiovascular system: S1 & S2 heard, RRR. No JVD, murmurs, rubs, gallops or clicks.  Gastrointestinal system: Abdomen is non distended, soft and non tender. Normal bowel sounds heard. Central nervous system: Alert and oriented x 3. No focal neurological deficits. Extremities: Right BKA , No edema, No  cyanosis , No clubbing Skin: No rashes, lesions or ulcers Psychiatry: Judgement and insight appear normal. Mood & affect appropriate.   Data Reviewed: I have personally reviewed following labs and imaging studies  CBC: Recent Labs  Lab 11/23/23 1217 11/24/23 0456 11/25/23 0538  WBC 4.0 3.7* 3.8*  HGB 11.9* 10.8* 11.1*  HCT 36.5* 32.8*  34.2*  MCV 81.7 81.8 82.2  PLT 188 174 173   Basic Metabolic Panel: Recent Labs  Lab 11/23/23 1217 11/24/23 0456 11/25/23 0538  NA 133* 135 133*  K 5.3* 5.1 4.6  CL 98 103 101  CO2 25 23 23   GLUCOSE 96 179* 176*  BUN 42* 36* 34*  CREATININE 3.06* 2.22* 2.56*  CALCIUM  9.1 9.0 9.0  MG  --   --  2.0  PHOS  --   --  4.4   GFR: Estimated Creatinine Clearance: 26.1 mL/min (A) (by C-G formula based on SCr of 2.56 mg/dL (H)). Liver Function Tests: Recent Labs  Lab 11/23/23 1217  AST 16  ALT 15  ALKPHOS 64  BILITOT 0.7  PROT 7.4  ALBUMIN 3.6   Recent Labs  Lab 11/23/23 1217  LIPASE 28   No results for input(s): AMMONIA in the last 168 hours. Coagulation Profile: No results for input(s): INR, PROTIME in the last 168 hours. Cardiac Enzymes: No results for input(s): CKTOTAL, CKMB, CKMBINDEX, TROPONINI in the last 168 hours. BNP (last 3 results) No results for input(s): PROBNP in the last 8760 hours. HbA1C: No results for input(s): HGBA1C in the last 72 hours. CBG: Recent Labs  Lab 11/24/23 1150 11/24/23 1609 11/24/23 2032 11/25/23 0805 11/25/23 1127  GLUCAP 162* 144* 107* 268* 93   Lipid Profile: No results for input(s): CHOL, HDL, LDLCALC, TRIG, CHOLHDL, LDLDIRECT in the last 72 hours. Thyroid Function Tests: No results for input(s): TSH, T4TOTAL, FREET4, T3FREE, THYROIDAB in the last 72 hours. Anemia Panel: No results for input(s): VITAMINB12, FOLATE, FERRITIN, TIBC, IRON , RETICCTPCT in the last 72 hours. Sepsis Labs: No results for input(s): PROCALCITON, LATICACIDVEN in the last 168 hours.  No results found for this or any previous visit (from the past 240 hours).   Radiology Studies: No results found.  Scheduled Meds:  apixaban   2.5 mg Oral BID   atorvastatin   20 mg Oral QHS   carvedilol   25 mg Oral BID WC   insulin  aspart  0-15 Units Subcutaneous TID WC   insulin  aspart  0-5 Units  Subcutaneous QHS   pantoprazole   40 mg Oral BID   sertraline   50 mg Oral Daily   tamsulosin   0.4 mg Oral QPC supper   Continuous Infusions:     LOS: 0 days    Time spent: 35 mins    Darcel Dawley, MD [facility]   If 7PM-7AM, please contact night-coverage

## 2023-11-25 NOTE — Progress Notes (Signed)
 Mobility Specialist - Progress Note   11/25/23 0942  Mobility  Activity Transferred from bed to chair  Level of Assistance Standby assist, set-up cues, supervision of patient - no hands on  Assistive Device Front wheel walker  Distance Ambulated (ft) 10 ft  Range of Motion/Exercises Active  Activity Response Tolerated well  Mobility Referral Yes  Mobility visit 1 Mobility  Mobility Specialist Start Time (ACUTE ONLY) 0935  Mobility Specialist Stop Time (ACUTE ONLY) [id number]  Mobility Specialist Time Calculation (min) (ACUTE ONLY) 7 min   Pt was found in bed and agreeable to mobilize. C/o being hungry. Pt breakfast arrived and agreeable to transfer to recliner chair and ambulate at later time. Was left on recliner chair with all needs met.   Erminio Leos,  Mobility Specialist Can be reached via Secure Chat

## 2023-11-26 DIAGNOSIS — N179 Acute kidney failure, unspecified: Secondary | ICD-10-CM | POA: Diagnosis not present

## 2023-11-26 LAB — BASIC METABOLIC PANEL WITH GFR
Anion gap: 9 (ref 5–15)
BUN: 30 mg/dL — ABNORMAL HIGH (ref 8–23)
CO2: 22 mmol/L (ref 22–32)
Calcium: 8.8 mg/dL — ABNORMAL LOW (ref 8.9–10.3)
Chloride: 103 mmol/L (ref 98–111)
Creatinine, Ser: 2.25 mg/dL — ABNORMAL HIGH (ref 0.61–1.24)
GFR, Estimated: 29 mL/min — ABNORMAL LOW (ref >60)
Glucose, Bld: 185 mg/dL — ABNORMAL HIGH (ref 70–99)
Potassium: 4.3 mmol/L (ref 3.5–5.1)
Sodium: 134 mmol/L — ABNORMAL LOW (ref 135–145)

## 2023-11-26 LAB — CBC
HCT: 31.3 % — ABNORMAL LOW (ref 39.0–52.0)
Hemoglobin: 10.4 g/dL — ABNORMAL LOW (ref 13.0–17.0)
MCH: 26.7 pg (ref 26.0–34.0)
MCHC: 33.2 g/dL (ref 30.0–36.0)
MCV: 80.3 fL (ref 80.0–100.0)
Platelets: 162 K/uL (ref 150–400)
RBC: 3.9 MIL/uL — ABNORMAL LOW (ref 4.22–5.81)
RDW: 16.2 % — ABNORMAL HIGH (ref 11.5–15.5)
WBC: 3.5 K/uL — ABNORMAL LOW (ref 4.0–10.5)
nRBC: 0 % (ref 0.0–0.2)

## 2023-11-26 LAB — GLUCOSE, CAPILLARY
Glucose-Capillary: 188 mg/dL — ABNORMAL HIGH (ref 70–99)
Glucose-Capillary: 247 mg/dL — ABNORMAL HIGH (ref 70–99)

## 2023-11-26 NOTE — TOC Transition Note (Signed)
 Transition of Care [facility]) - Discharge Note   Patient Details  Name: Cory [name] MRN: [id number] Date of Birth: 1946/05/09  Transition of Care [facility]) CM/SW Contact:  Sonda Manuella Quill, RN Phone Number: 11/26/2023, 10:15 AM   Clinical Narrative:    D/C orders received; no TOC needs.   Final next level of care: Home/Self Care Barriers to Discharge: No Barriers Identified   Patient Goals and CMS Choice Patient states their goals for this hospitalization and ongoing recovery are:: Home CMS Medicare.gov Compare Post Acute Care list provided to:: Patient Choice offered to / list presented to : Patient [place] ownership interest in [facility].provided to:: Patient    Discharge Placement                       Discharge Plan and Services Additional resources added to the After Visit Summary for     Discharge Planning Services: CM Consult Post Acute Care Choice: Resumption of Svcs/PTA Provider                               Social Drivers of Health (SDOH) Interventions SDOH Screenings   Food Insecurity: Food Insecurity Present (11/23/2023)  Housing: High Risk (11/23/2023)  Transportation Needs: No Transportation Needs (11/23/2023)  Utilities: At Risk (11/23/2023)  Depression (PHQ2-9): Low Risk  (10/09/2023)  Social Connections: Moderately Integrated (11/23/2023)  Recent Concern: Social Connections - Moderately Isolated (10/19/2023)  Tobacco Use: Medium Risk (11/23/2023)     Readmission Risk Interventions     No data to display

## 2023-11-26 NOTE — Discharge Instructions (Signed)
 Advised to follow-up with primary care physician in 1 week. Advised to follow-up with nephrology for AKI on CKD. Advised PCP to resume losartan  once renal functions improved. Advised to drink more fluids as required.

## 2023-11-26 NOTE — Discharge Summary (Signed)
 Physician Discharge Summary  [place] FMW:[id number] DOB: 14-Mar-1946 DOA: 11/23/2023  PCP: [name], MD  Admit date: 11/23/2023  Discharge date: 11/26/2023  Admitted From: Home  Disposition:  Home  Recommendations for Outpatient Follow-up:  Follow up with PCP in 1-2 weeks. Please obtain BMP/CBC in one week. Advised to follow-up with Nephrology for AKI on CKD. Advised PCP to resume losartan  once renal functions improved. Advised to drink more fluids as required.  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 Patient states he was in his usual state of health until 2 days ago, when he had developed vomiting, he has had reduced oral intake since that time. Evaluation in the emergency department is relatively benign, though he was noted to have some acute on chronic kidney injury.  Patient was admitted for further evaluation. Patient was continued on IV fluid resuscitation.Serum creatinine slowly improving.  Nausea and vomiting has improved.  Patient tolerated carb modified diet.  Losartan  On hold. Advised patient to follow-up with PCP to resume losartan  once renal functions improved.  Patient is being discharged home.  Discharge Diagnoses:  Principal Problem:   AKI (acute kidney injury) (HCC)  AKI on CKD stage IIIa; Likely due to volume loss from vomiting. Baseline serum creatinine remains between 1.6-1.8 Creatinine on admission > 3.06. Serum creatinine improving.  3.06 > 2.22> slightly up today 2.56 > 2.21 Continue gentle fluid resuscitation. Avoid nephrotoxins, including  diuretics. Follow-up nephrology outpatient.   Hyperkalemia: > Resolved. Likely due to AKI as well as potassium supplementation at home for his diuretics. EKG shows atrial flutter.  Rate controlled. Continue to monitor serum potassium.   Nausea and Vomiting : Unclear etiology, no obstruction or other acute findings on CT scan, likely viral. Continue Supportive care with gentle IV fluids, nausea medication as needed. Clear liquid diet and advance as tolerated. Nausea vomiting resolved, tolerated soft carb modified diet.   Insulin -dependent type 2 diabetes: Last hemoglobin A1c 9.6 which is gradually improving. Carb modified diet. Continue home Lantus  15 units nightly Moderate dose sliding scale.   Atrial flutter: Continue Eliquis . HR well controlled.   Hyperlipidemia: Continue Lipitor   Depression: Continue Zoloft    Constipation: Continue MiraLAX  as needed   GERD: Continue Protonix .    Discharge Instructions  Discharge Instructions     Call MD for:  difficulty breathing, headache or visual disturbances   Complete by: As directed    Call MD for:  persistant dizziness or light-headedness   Complete by: As directed    Call MD for:  persistant nausea and vomiting   Complete by: As directed    Diet - low sodium heart healthy   Complete by: As directed    Diet general   Complete by: As directed    Discharge instructions   Complete by: As directed    Advised to follow-up with primary care physician in 1 week. Advised to follow-up with nephrology for AKI on CKD. Advised PCP to resume losartan  once renal functions improved. Advised to drink more fluids as required.   Increase activity slowly   Complete by: As directed       Allergies as of 11/26/2023  Reactions   Fish Allergy Other (See Comments)   No scaled fish!!   Pork-derived Products Other (See Comments)   Will not eat pork and religious reasons   Shellfish Allergy Other (See Comments)   Will not eat  shellfish   Pumpkin Seed Oil Rash        Medication List     STOP taking these medications    isosorbide -hydrALAZINE  20-37.5 MG tablet Commonly known as: BIDIL    telmisartan  40 MG tablet Commonly known as: MICARDIS        TAKE these medications    acetaminophen  325 MG tablet Commonly known as: TYLENOL  Take 2 tablets (650 mg total) by mouth every 6 (six) hours as needed for mild pain, headache or fever (or Fever >/= 101).   amLODipine  5 MG tablet Commonly known as: NORVASC  Take 1 tablet (5 mg total) by mouth daily.   atorvastatin  20 MG tablet Commonly known as: LIPITOR Take 20 mg by mouth at bedtime.   carvedilol  25 MG tablet Commonly known as: COREG  Take 25 mg by mouth 2 (two) times daily with a meal.   Eliquis  5 MG Tabs tablet Generic drug: apixaban  Take 5 mg by mouth 2 (two) times daily.   insulin  lispro 100 UNIT/ML injection Commonly known as: HUMALOG Inject 0-12 Units into the skin with breakfast, with lunch, and with evening meal.   Lantus  SoloStar 100 UNIT/ML Solostar Pen Generic drug: insulin  glargine Inject 15 Units into the skin at bedtime.   pantoprazole  40 MG tablet Commonly known as: PROTONIX  Take 40 mg by mouth 2 (two) times daily.   polyethylene glycol 17 g packet Commonly known as: MIRALAX  / GLYCOLAX  Take 17 g by mouth daily as needed.   Potassium Chloride  ER 20 MEQ Tbcr Take 20 mEq by mouth daily.   sertraline  50 MG tablet Commonly known as: ZOLOFT  Take 50 mg by mouth daily.   tamsulosin  0.4 MG Caps capsule Commonly known as: FLOMAX  Take 0.4 mg by mouth daily after supper.   torsemide  20 MG tablet Commonly known as: DEMADEX  Take 20 mg by mouth daily.   Trulicity  0.75 MG/0.5ML Soaj Generic drug: Dulaglutide  Inject 0.75 mg into the skin once a week. Tuesday's   Vitamin D (Ergocalciferol) 1.25 MG (50000 UNIT) Caps capsule Commonly known as: DRISDOL Take 50,000 Units by mouth once a week.        Follow-up Information      [name], MD Follow up in 1 week(s).   Specialty: Family Medicine Contact information: [address] [place] [address] [phone]         [place] Follow up in 1 week(s).   Contact information: [address] [place] [address] [phone]                Allergies  Allergen Reactions   Fish Allergy Other (See Comments)    No scaled fish!!   Pork-Derived Products Other (See Comments)    Will not eat pork and religious reasons   Shellfish Allergy Other (See Comments)    Will not eat shellfish   Pumpkin Seed Oil Rash    Consultations: None   Procedures/Studies: CT ABDOMEN PELVIS WO CONTRAST Result Date: 11/23/2023 CLINICAL DATA:  Bowel obstruction, nausea: History of sigmoid colon cancer. EXAM: CT ABDOMEN AND PELVIS WITHOUT CONTRAST TECHNIQUE: Multidetector CT imaging of the abdomen and pelvis was performed following the standard protocol without IV contrast. RADIATION DOSE REDUCTION: This exam was performed according to the departmental dose-optimization  program which includes automated exposure control, adjustment of the mA and/or kV according to patient size and/or use of iterative reconstruction technique. COMPARISON:  CT chest abdomen pelvis Sep 18, 2023, abdomen radiograph Sep 18 2023. FINDINGS: Evaluation is suboptimal given lack of contrast. No punctate calcifications in the left kidney lower Lower chest: Calcified nodule in left lower lobe perifissural suggestive of prior granulomatous disease. Dendritic ossifications in bilateral lung base subpleural likely due to chronic aspiration. Left thyroid lobe subcentimeter nodule. Endotracheal tube in proper position. Normal splenic Hepatobiliary: Left hepatic lobe few hypodense lesions are stable to prior. Gallbladder appears unremarkable. No biliary dilatation Pancreas: Pancreas is normal in appearance. No pancreatic ductal dilatation. Spleen: Normal size multiple hypodense. Hypodense  splenic lesion measuring 1.7 cm, stable to prior. Smaller subcentimeter lesions in lower pole not conspicuous on current noncontrast CT. Adrenals/Urinary Tract: Punctate calcifications in left kidney lower pole likely vascular. No hydronephrosis. Bilateral adrenal glands are within normal limits. Bladder is under distended and slightly thick wall. Stomach/Bowel: Normal stomach. Interval removal of enteric tube. Bowel loops including sigmoid colon appear unremarkable and within normal limits for noncontrast CT without evidence of abnormal dilation or suspicious mass or bowel obstruction. Previously seen wall thickening of the sigmoid colon is not visualized on current exam. Appendix is normal. Vascular/Lymphatic: Atherosclerotic calcifications of aorta and iliac arteries. No aneurysm. No suspicious lymphadenopathy. No ascites. Reproductive: Prostatomegaly.  Normal seminal vesicles. Other: Small fat containing umbilical hernia A tiny left inguinal fat containing hernia. Musculoskeletal: No suspicious osseous metastasis. Degenerative changes of the spine and bilateral hip joints. IMPRESSION: No bowel obstruction.Normal noncontrast CT appearance of bowel loops including sigmoid colon. No suspicious findings to suggest nodal or distant metastasis. Redemonstration of stable hepatic and splenic hypodense lesions incompletely assessed on current noncontrast CT, likely representing cysts or hemangiomas. Electronically Signed   By: Megan  Zare M.D.   On: 11/23/2023 14:16     Subjective: Patient was seen and examined at bedside.  Overnight events noted. Patient reports feeling much improved,  renal functions are improving, He wants to be discharged.  Discharge Exam: Vitals:   11/26/23 0451 11/26/23 1204  BP: (!) 140/85 120/82  Pulse: (!) 58 62  Resp: 15 17  Temp: 97.8 F (36.6 C) 98.3 F (36.8 C)  SpO2: 100% 100%   Vitals:   11/25/23 1416 11/25/23 2034 11/26/23 0451 11/26/23 1204  BP: 115/72 126/82 (!)  140/85 120/82  Pulse: 63 65 (!) 58 62  Resp: 16 17 15 17   Temp: 98.7 F (37.1 C) 98.4 F (36.9 C) 97.8 F (36.6 C) 98.3 F (36.8 C)  TempSrc: Oral Oral Oral Oral  SpO2: 100% 100% 100% 100%  Weight:      Height:        General: Pt is alert, awake, not in acute distress Cardiovascular: RRR, S1/S2 +, no rubs, no gallops Respiratory: CTA bilaterally, no wheezing, no rhonchi Abdominal: Soft, NT, ND, bowel sounds + Extremities: no edema, no cyanosis    The results of significant diagnostics from this hospitalization (including imaging, microbiology, ancillary and laboratory) are listed below for reference.     Microbiology: No results found for this or any previous visit (from the past 240 hours).   Labs: BNP (last 3 results) No results for input(s): BNP in the last 8760 hours. Basic Metabolic Panel: Recent Labs  Lab 11/23/23 1217 11/24/23 0456 11/25/23 0538 11/26/23 0524  NA 133* 135 133* 134*  K 5.3* 5.1 4.6 4.3  CL 98 103 101 103  CO2 25 23 23 22   GLUCOSE 96 179* 176* 185*  BUN 42* 36* 34* 30*  CREATININE 3.06* 2.22* 2.56* 2.25*  CALCIUM  9.1 9.0 9.0 8.8*  MG  --   --  2.0  --   PHOS  --   --  4.4  --    Liver Function Tests: Recent Labs  Lab 11/23/23 1217  AST 16  ALT 15  ALKPHOS 64  BILITOT 0.7  PROT 7.4  ALBUMIN 3.6   Recent Labs  Lab 11/23/23 1217  LIPASE 28   No results for input(s): AMMONIA in the last 168 hours. CBC: Recent Labs  Lab 11/23/23 1217 11/24/23 0456 11/25/23 0538 11/26/23 0524  WBC 4.0 3.7* 3.8* 3.5*  HGB 11.9* 10.8* 11.1* 10.4*  HCT 36.5* 32.8* 34.2* 31.3*  MCV 81.7 81.8 82.2 80.3  PLT 188 174 173 162   Cardiac Enzymes: No results for input(s): CKTOTAL, CKMB, CKMBINDEX, TROPONINI in the last 168 hours. BNP: Invalid input(s): POCBNP CBG: Recent Labs  Lab 11/25/23 1127 11/25/23 1603 11/25/23 2115 11/26/23 0728 11/26/23 1203  GLUCAP 93 179* 224* 188* 247*   D-Dimer No results for input(s):  DDIMER in the last 72 hours. Hgb A1c No results for input(s): HGBA1C in the last 72 hours. Lipid Profile No results for input(s): CHOL, HDL, LDLCALC, TRIG, CHOLHDL, LDLDIRECT in the last 72 hours. Thyroid function studies No results for input(s): TSH, T4TOTAL, T3FREE, THYROIDAB in the last 72 hours.  Invalid input(s): FREET3 Anemia work up No results for input(s): VITAMINB12, FOLATE, FERRITIN, TIBC, IRON , RETICCTPCT in the last 72 hours. Urinalysis    Component Value Date/Time   COLORURINE STRAW (A) 11/24/2023 0428   APPEARANCEUR CLEAR 11/24/2023 0428   LABSPEC 1.011 11/24/2023 0428   PHURINE 7.0 11/24/2023 0428   GLUCOSEU 50 (A) 11/24/2023 0428   HGBUR NEGATIVE 11/24/2023 0428   BILIRUBINUR NEGATIVE 11/24/2023 0428   KETONESUR NEGATIVE 11/24/2023 0428   PROTEINUR 100 (A) 11/24/2023 0428   NITRITE NEGATIVE 11/24/2023 0428   LEUKOCYTESUR TRACE (A) 11/24/2023 0428   Sepsis Labs Recent Labs  Lab 11/23/23 1217 11/24/23 0456 11/25/23 0538 11/26/23 0524  WBC 4.0 3.7* 3.8* 3.5*   Microbiology No results found for this or any previous visit (from the past 240 hours).   Time coordinating discharge: Over 30 minutes  SIGNED:   Darcel Dawley, MD  [facility] 11/26/2023, 3:13 PM Pager   If 7PM-7AM, please contact night-coverage

## 2023-11-28 ENCOUNTER — Telehealth: Payer: Self-pay

## 2023-11-28 ENCOUNTER — Telehealth: Payer: Self-pay | Admitting: Neurology

## 2023-11-28 NOTE — Telephone Encounter (Signed)
 [facility] called and stated that they needed surgical clearance. I stated I have not received the paperwork and requested them to fax it over

## 2023-11-28 NOTE — Telephone Encounter (Signed)
 Received urgent clearance from [facility] neurosurgery paperwork for colon cancer robotic colon resection under general anesthesia.  Requesting neurology clearance.  He is interested in surgery ASAP.  I recently saw him for initial stroke follow-up.  History of incidental stroke finding, small acute left parietal infarcts in the setting of altered mental status with hypothermia and hypoglycemia in the 30s.  History of A-fib on Eliquis .  A1c 9.6  When I saw him in the office last week, he vomited in the office, I advised him to go to the ER.  He ended up in the ER 2 days later with AKI, creatinine 3.06. He is now at home.

## 2023-11-29 ENCOUNTER — Telehealth: Payer: Self-pay | Admitting: Neurology

## 2023-11-29 NOTE — Telephone Encounter (Signed)
 I called the patient to discuss surgical clearance from [facility] surgery. I left a message and asked him to call back.

## 2023-11-29 NOTE — Telephone Encounter (Signed)
 I spoke with patient's niece, [name].  I called the patient and he directed me to call [name].  Patient was supposed to have colon resection several months ago but has been delayed due to multiple hospitalizations.  Would like to pursue surgery ASAP.  Advised we will sign off on clearance, generally recommend holding procedures 3 to 6 months post stroke, but in this case since cancer is concerned inclined to recommend earlier clearance within acceptable periprocedural risk of TIA/stroke if patient is willing.

## 2023-11-29 NOTE — Telephone Encounter (Signed)
 Clearance letter faxed to [facility]

## 2023-12-29 ENCOUNTER — Ambulatory Visit: Payer: Self-pay | Admitting: Surgery

## 2023-12-29 DIAGNOSIS — R739 Hyperglycemia, unspecified: Secondary | ICD-10-CM

## 2023-12-29 NOTE — Progress Notes (Signed)
 Sent message, via epic in basket, requesting orders in epic from [facility] adviser.

## 2024-01-01 ENCOUNTER — Encounter (HOSPITAL_BASED_OUTPATIENT_CLINIC_OR_DEPARTMENT_OTHER): Payer: Self-pay | Admitting: Student in an Organized Health Care Education/Training Program

## 2024-01-01 ENCOUNTER — Other Ambulatory Visit: Payer: Self-pay

## 2024-01-01 ENCOUNTER — Emergency Department (HOSPITAL_BASED_OUTPATIENT_CLINIC_OR_DEPARTMENT_OTHER)
Admission: EM | Admit: 2024-01-01 | Discharge: 2024-01-02 | Disposition: A | Discharge status: Home / Self Care | Attending: Emergency Medicine | Admitting: Emergency Medicine

## 2024-01-01 DIAGNOSIS — E1165 Type 2 diabetes mellitus with hyperglycemia: Secondary | ICD-10-CM | POA: Diagnosis not present

## 2024-01-01 DIAGNOSIS — E875 Hyperkalemia: Secondary | ICD-10-CM | POA: Insufficient documentation

## 2024-01-01 DIAGNOSIS — I130 Hypertensive heart and chronic kidney disease with heart failure and stage 1 through stage 4 chronic kidney disease, or unspecified chronic kidney disease: Secondary | ICD-10-CM | POA: Diagnosis not present

## 2024-01-01 DIAGNOSIS — Z79899 Other long term (current) drug therapy: Secondary | ICD-10-CM | POA: Insufficient documentation

## 2024-01-01 DIAGNOSIS — R195 Other fecal abnormalities: Secondary | ICD-10-CM | POA: Insufficient documentation

## 2024-01-01 DIAGNOSIS — Z859 Personal history of malignant neoplasm, unspecified: Secondary | ICD-10-CM | POA: Insufficient documentation

## 2024-01-01 DIAGNOSIS — I509 Heart failure, unspecified: Secondary | ICD-10-CM | POA: Diagnosis not present

## 2024-01-01 DIAGNOSIS — Z794 Long term (current) use of insulin: Secondary | ICD-10-CM | POA: Insufficient documentation

## 2024-01-01 DIAGNOSIS — R7989 Other specified abnormal findings of blood chemistry: Secondary | ICD-10-CM | POA: Diagnosis present

## 2024-01-01 DIAGNOSIS — N189 Chronic kidney disease, unspecified: Secondary | ICD-10-CM | POA: Insufficient documentation

## 2024-01-01 DIAGNOSIS — D649 Anemia, unspecified: Secondary | ICD-10-CM | POA: Insufficient documentation

## 2024-01-01 DIAGNOSIS — N179 Acute kidney failure, unspecified: Secondary | ICD-10-CM | POA: Diagnosis present

## 2024-01-01 DIAGNOSIS — E1122 Type 2 diabetes mellitus with diabetic chronic kidney disease: Secondary | ICD-10-CM | POA: Insufficient documentation

## 2024-01-01 DIAGNOSIS — Z7901 Long term (current) use of anticoagulants: Secondary | ICD-10-CM | POA: Insufficient documentation

## 2024-01-01 LAB — BASIC METABOLIC PANEL WITH GFR
Anion gap: 11 (ref 5–15)
BUN: 46 mg/dL — ABNORMAL HIGH (ref 8–23)
CO2: 21 mmol/L — ABNORMAL LOW (ref 22–32)
Calcium: 8.7 mg/dL — ABNORMAL LOW (ref 8.9–10.3)
Chloride: 99 mmol/L (ref 98–111)
Creatinine, Ser: 2.92 mg/dL — ABNORMAL HIGH (ref 0.61–1.24)
GFR, Estimated: 22 mL/min — ABNORMAL LOW (ref >60)
Glucose, Bld: 275 mg/dL — ABNORMAL HIGH (ref 70–99)
Potassium: 5.6 mmol/L — ABNORMAL HIGH (ref 3.5–5.1)
Sodium: 132 mmol/L — ABNORMAL LOW (ref 135–145)

## 2024-01-01 LAB — OCCULT BLOOD X 1 CARD TO LAB, STOOL: Fecal Occult Bld: POSITIVE — AB

## 2024-01-01 LAB — CBC
HCT: 29.9 % — ABNORMAL LOW (ref 39.0–52.0)
Hemoglobin: 10 g/dL — ABNORMAL LOW (ref 13.0–17.0)
MCH: 27.2 pg (ref 26.0–34.0)
MCHC: 33.4 g/dL (ref 30.0–36.0)
MCV: 81.5 fL (ref 80.0–100.0)
Platelets: 177 K/uL (ref 150–400)
RBC: 3.67 MIL/uL — ABNORMAL LOW (ref 4.22–5.81)
RDW: 15.6 % — ABNORMAL HIGH (ref 11.5–15.5)
WBC: 3.6 K/uL — ABNORMAL LOW (ref 4.0–10.5)
nRBC: 0 % (ref 0.0–0.2)

## 2024-01-01 LAB — POTASSIUM: Potassium: 5.6 mmol/L — ABNORMAL HIGH (ref 3.5–5.1)

## 2024-01-01 LAB — CBG MONITORING, ED: Glucose-Capillary: 363 mg/dL — ABNORMAL HIGH (ref 70–99)

## 2024-01-01 MED ORDER — TAMSULOSIN HCL 0.4 MG PO CAPS: 0.4000 mg | ORAL_CAPSULE | Freq: Every day | ORAL | Status: DC

## 2024-01-01 MED ORDER — POLYETHYLENE GLYCOL 3350 17 G PO PACK: 17.0000 g | PACK | Freq: Every day | ORAL | Status: DC | PRN

## 2024-01-01 MED ORDER — APIXABAN 2.5 MG PO TABS
5.0000 mg | ORAL_TABLET | Freq: Two times a day (BID) | ORAL | Status: DC
  Administered 2024-01-01: 5 mg via ORAL
  Filled 2024-01-01: qty 2

## 2024-01-01 MED ORDER — PANTOPRAZOLE SODIUM 40 MG PO TBEC
40.0000 mg | DELAYED_RELEASE_TABLET | Freq: Two times a day (BID) | ORAL | Status: DC
  Administered 2024-01-01: 40 mg via ORAL
  Filled 2024-01-01: qty 1

## 2024-01-01 MED ORDER — TORSEMIDE 20 MG PO TABS: 20.0000 mg | ORAL_TABLET | Freq: Every day | ORAL | Status: DC

## 2024-01-01 MED ORDER — INSULIN LISPRO 100 UNIT/ML IJ SOLN: 0.0000 [IU] | Freq: Three times a day (TID) | INTRAMUSCULAR | Status: DC

## 2024-01-01 MED ORDER — LACTATED RINGERS IV BOLUS
250.0000 mL | Freq: Once | INTRAVENOUS | Status: AC
  Administered 2024-01-01: 250 mL via INTRAVENOUS

## 2024-01-01 MED ORDER — INSULIN GLARGINE-YFGN 100 UNIT/ML ~~LOC~~ SOLN
15.0000 [IU] | Freq: Every day | SUBCUTANEOUS | Status: DC
  Administered 2024-01-01: 15 [IU] via SUBCUTANEOUS
  Filled 2024-01-01: qty 150

## 2024-01-01 MED ORDER — SODIUM ZIRCONIUM CYCLOSILICATE 10 G PO PACK
10.0000 g | PACK | Freq: Once | ORAL | Status: AC
  Administered 2024-01-01: 10 g via ORAL
  Filled 2024-01-01: qty 1

## 2024-01-01 MED ORDER — INSULIN ASPART 100 UNIT/ML IJ SOLN: 0.0000 [IU] | Freq: Three times a day (TID) | INTRAMUSCULAR | Status: DC

## 2024-01-01 MED ORDER — SERTRALINE HCL 25 MG PO TABS: 50.0000 mg | ORAL_TABLET | Freq: Every day | ORAL | Status: DC

## 2024-01-01 MED ORDER — AMLODIPINE BESYLATE 5 MG PO TABS: 5.0000 mg | ORAL_TABLET | Freq: Every day | ORAL | Status: DC

## 2024-01-01 MED ORDER — CARVEDILOL 12.5 MG PO TABS
25.0000 mg | ORAL_TABLET | Freq: Two times a day (BID) | ORAL | Status: DC
  Administered 2024-01-02: 25 mg via ORAL
  Filled 2024-01-01: qty 2

## 2024-01-01 NOTE — ED Triage Notes (Signed)
 Pt POV from home, was seen yesterday for for pre-surgery checkup (colon cancer, colectomy scheduled 9/20), told to come to ED due to elevated kidney function. Endorses normal urinary patterns, denies SOB or any other sx.

## 2024-01-01 NOTE — ED Provider Notes (Cosign Needed Addendum)
 [place] EMERGENCY DEPARTMENT AT [facility] Provider Note   CSN: [id number] Arrival date & time: 01/01/24  1650     Patient presents with: Abnormal Lab   Cory [name] is a 78 y.o. male who presents to the emergency department due to abnormal labs.  Patient was previously seen by primary care for routine labs and labs were noted to be abnormal.  Patient was told to come to the emergency department due to elevated kidney function on labs.  Patient states that he is having normal urinary patterns.  Patient also appreciates some possible blood in his stool, denies bright red blood stating that it is more jelly-like and states that this has been ongoing for quite some time. Past medical history significant for hyperlipidemia, hypertension, chronic kidney disease, type 2 diabetes, acute kidney injury, atrial flutter, acute metabolic encephalopathy, cancer, heart failure, stroke, etc. Of note patient has upcoming colectomy for colon cancer scheduled, pre-op is this upcoming Friday 8/29.     Abnormal Lab      Prior to Admission medications   Medication Sig Start Date End Date Taking? Authorizing Provider  acetaminophen  (TYLENOL ) 325 MG tablet Take 2 tablets (650 mg total) by mouth every 6 (six) hours as needed for mild pain, headache or fever (or Fever >/= 101). 12/07/20   Gonfa, Taye T, MD  amLODipine  (NORVASC ) 5 MG tablet Take 1 tablet (5 mg total) by mouth daily. 09/30/23   Odell Celinda Balo, MD  apixaban  (ELIQUIS ) 5 MG TABS tablet Take 5 mg by mouth 2 (two) times daily.    [provider]  atorvastatin  (LIPITOR) 20 MG tablet Take 20 mg by mouth at bedtime. 06/27/23   [provider]  carvedilol  (COREG ) 25 MG tablet Take 25 mg by mouth 2 (two) times daily with a meal. 03/16/23   [provider]  insulin  lispro (HUMALOG ) 100 UNIT/ML injection Inject 0-12 Units into the skin with breakfast, with lunch, and with evening meal.    [provider]   LANTUS  SOLOSTAR 100 UNIT/ML Solostar Pen Inject 15 Units into the skin at bedtime. 10/21/23   Danford, Lonni SQUIBB, MD  pantoprazole  (PROTONIX ) 40 MG tablet Take 40 mg by mouth 2 (two) times daily. 06/09/23   [provider]  polyethylene glycol (MIRALAX  / GLYCOLAX ) 17 g packet Take 17 g by mouth daily as needed. 05/22/23   Elgergawy, Brayton RAMAN, MD  Potassium Chloride  ER 20 MEQ TBCR Take 20 mEq by mouth daily. 08/03/23   [provider]  sertraline  (ZOLOFT ) 50 MG tablet Take 50 mg by mouth daily. 09/18/23   [provider]  tamsulosin  (FLOMAX ) 0.4 MG CAPS capsule Take 0.4 mg by mouth daily after supper. 06/27/23   [provider]  torsemide  (DEMADEX ) 20 MG tablet Take 20 mg by mouth daily. 09/05/23   [provider]  TRULICITY  0.75 MG/0.5ML SOAJ Inject 0.75 mg into the skin once a week. Tuesday's 10/10/23   [provider]  Vitamin D, Ergocalciferol, (DRISDOL) 1.25 MG (50000 UNIT) CAPS capsule Take 50,000 Units by mouth once a week. 04/05/23   [provider]  furosemide  (LASIX ) 20 MG tablet Take 1-2 tablets in the morning for your ankle and foot swelling. Patient not taking: Reported on 11/18/2020 11/12/20 11/27/20  Rolinda Rogue, MD    Allergies: Fish allergy, Pork-derived products, Shellfish allergy, and Pumpkin seed oil    Review of Systems  Gastrointestinal:  Positive for blood in stool.    Updated Vital Signs BP 121/73  Pulse 61   Temp 97.7 F (36.5 C) (Oral)   Resp 18   Ht 5' 11 (1.803 m)   Wt 82.6 kg   SpO2 99%   BMI 25.38 kg/m   Physical Exam Vitals and nursing note reviewed.  Constitutional:      General: He is awake. He is not in acute distress.    Appearance: Normal appearance. He is not ill-appearing, toxic-appearing or diaphoretic.  HENT:     Head: Normocephalic and atraumatic.  Eyes:     General: No scleral icterus. Cardiovascular:     Rate and Rhythm: Rhythm irregular.  Pulmonary:     Effort: Pulmonary  effort is normal. No respiratory distress.     Breath sounds: No wheezing, rhonchi or rales.  Abdominal:     Tenderness: There is no right CVA tenderness or left CVA tenderness.  Musculoskeletal:        General: Normal range of motion.     Right lower leg: No edema.     Left lower leg: No edema.  Skin:    General: Skin is warm.     Capillary Refill: Capillary refill takes less than 2 seconds.  Neurological:     General: No focal deficit present.     Mental Status: He is alert and oriented to person, place, and time.  Psychiatric:        Mood and Affect: Mood normal.        Behavior: Behavior normal. Behavior is cooperative.     (all labs ordered are listed, but only abnormal results are displayed) Labs Reviewed  CBC - Abnormal; Notable for the following components:      Result Value   WBC 3.6 (*)    RBC 3.67 (*)    Hemoglobin 10.0 (*)    HCT 29.9 (*)    RDW 15.6 (*)    All other components within normal limits  BASIC METABOLIC PANEL WITH GFR - Abnormal; Notable for the following components:   Sodium 132 (*)    Potassium 5.6 (*)    CO2 21 (*)    Glucose, Bld 275 (*)    BUN 46 (*)    Creatinine, Ser 2.92 (*)    Calcium  8.7 (*)    GFR, Estimated 22 (*)    All other components within normal limits  OCCULT BLOOD X 1 CARD TO LAB, STOOL - Abnormal; Notable for the following components:   Fecal Occult Bld POSITIVE (*)    All other components within normal limits  POTASSIUM - Abnormal; Notable for the following components:   Potassium 5.6 (*)    All other components within normal limits  CBG MONITORING, ED - Abnormal; Notable for the following components:   Glucose-Capillary 363 (*)    All other components within normal limits    EKG: EKG Interpretation Date/Time:  Monday January 01 2024 19:17:26 EDT Ventricular Rate:  60 PR Interval:    QRS Duration:  150 QT Interval:  402 QTC Calculation: 402 R Axis:   0  Text Interpretation: Atrial flutter 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   Procedures   Medications Ordered in the ED  amLODipine  (NORVASC ) tablet 5 mg (5 mg Oral Not Given 01/01/24 2032)  apixaban  (ELIQUIS ) tablet 5 mg (5 mg Oral Given 01/01/24 2152)  carvedilol  (COREG ) tablet 25 mg (has no administration in time range)  insulin  glargine-yfgn (SEMGLEE )  injection 15 Units (15 Units Subcutaneous Given 01/01/24 2152)  pantoprazole  (PROTONIX ) EC tablet 40 mg (40 mg Oral Given 01/01/24 2152)  polyethylene glycol (MIRALAX  / GLYCOLAX ) packet 17 g (has no administration in time range)  sertraline  (ZOLOFT ) tablet 50 mg (50 mg Oral Not Given 01/01/24 2033)  tamsulosin  (FLOMAX ) capsule 0.4 mg (0 mg Oral Hold 01/01/24 2034)  insulin  aspart (novoLOG ) injection 0-12 Units (has no administration in time range)  lactated ringers  bolus 250 mL (0 mLs Intravenous Stopped 01/01/24 2006)  sodium zirconium cyclosilicate  (LOKELMA ) packet 10 g (10 g Oral Given 01/01/24 1923)                                    Medical Decision Making Amount and/or Complexity of Data Reviewed Labs: ordered.  Risk OTC drugs. Prescription drug management. Decision regarding hospitalization.   Patient presents to the ED for concern of abnormal labs, patient states he is currently asymptomatic, this involves an extensive number of treatment options, and is a complaint that carries with it a high risk of complications and morbidity.  The differential diagnosis includes AKI, acute renal failure, hyperkalemia, EKG changes related to hyperkalemia, obstructive kidney stone, etc.   Co morbidities that complicate the patient evaluation  hyperlipidemia, hypertension, chronic kidney disease, type 2 diabetes, acute kidney injury, atrial flutter, acute metabolic encephalopathy, cancer, heart failure, stroke   Additional history obtained:  Reviewed last echo which  showed EF of 25-30%, reviewed prior admissions for AKI most recent 1 month ago, baseline creatinine seems to be 1.7-1.8   Lab Tests:  I Ordered, and personally interpreted labs.  The pertinent results include: CBC remarkable for hemoglobin of 10, BMP remarkable for sodium of 132, potassium of 5.6, glucose of 275, creatinine of 2.92, calcium  of 8.7, estimated GFR of 22, fecal occult blood positive   Cardiac Monitoring:  The patient was maintained on a cardiac monitor.  I personally viewed and interpreted the cardiac monitored which showed an underlying rhythm of: A flutter   Medicines ordered and prescription drug management:  I ordered medication including fluids, Lokelma  for AKI, hyperkalemia Reevaluation of the patient after these medicines showed that the patient stayed the same I have reviewed the patients home medicines and have made adjustments as needed   Test Considered:  None   Critical Interventions:  None   Consultations Obtained:  I requested consultation with the hospitalist and discussed lab and imaging findings as well as pertinent plan - they recommend: Admission for ongoing diagnosis and treatment   Problem List / ED Course:  79 year old male, vital signs stable, was seen by surgery for preop for colectomy due to colon cancer and noted to have elevated kidney function, recommended emergency department evaluation On physical exam patient overall well-appearing, currently denying medical complaint other than blood in stool which has been ongoing for months, no urinary symptoms Lab workup significant for hemoglobin of 10, potassium of 5.6, creatinine 2.92, estimated GFR 22 Will get stool sample for Hemoccult card however I do not believe this will change management as patient has known colorectal cancer and this is presumably where blood is coming from Due to acute kidney injury, small bolus of lactated Ringer 's ordered due to patient's cardiac history, Lokelma   also ordered Plan to admit for AKI, EKG ordered due to hyperkalemia, showed A-flutter - Hx of same Consulted with hospitalist Dr. Lenon who agrees with admission for patient, initially wanted insulin   and albuterol  given to patient as well as calcium , I consulted with attending in the ED Dr. Jerrol and he agrees with holding this for now but restarting patient home medications Home meds ordered At time of my shift ending patient still roomed at [facility] pending transfer to hospital, patient stable at this time   Reevaluation:  After the interventions noted above, I reevaluated the patient and found that they have :stayed the same   Social Determinants of Health:  none   Dispostion:  After consideration of the diagnostic results and the patients response to treatment, I feel that the patent would benefit from mission the hospital for ongoing diagnosis and treatment.     Final diagnoses:  Acute kidney injury (HCC)  Hyperkalemia  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)  Anemia, unspecified type  Occult blood in stools    ED Discharge Orders     None          Janetta Terrall FALCON, PA-C 01/01/24 2306    Janetta Terrall FALCON, [place] 01/02/24 1206    Jerrol Agent, MD 01/03/24 1343

## 2024-01-02 LAB — CBC
HCT: 32.2 % — ABNORMAL LOW (ref 39.0–52.0)
Hemoglobin: 10.7 g/dL — ABNORMAL LOW (ref 13.0–17.0)
MCH: 26.9 pg (ref 26.0–34.0)
MCHC: 33.2 g/dL (ref 30.0–36.0)
MCV: 80.9 fL (ref 80.0–100.0)
Platelets: 165 K/uL (ref 150–400)
RBC: 3.98 MIL/uL — ABNORMAL LOW (ref 4.22–5.81)
RDW: 15.3 % (ref 11.5–15.5)
WBC: 4.3 K/uL (ref 4.0–10.5)
nRBC: 0 % (ref 0.0–0.2)

## 2024-01-02 LAB — BASIC METABOLIC PANEL WITH GFR
Anion gap: 10 (ref 5–15)
BUN: 39 mg/dL — ABNORMAL HIGH (ref 8–23)
CO2: 21 mmol/L — ABNORMAL LOW (ref 22–32)
Calcium: 9.3 mg/dL (ref 8.9–10.3)
Chloride: 100 mmol/L (ref 98–111)
Creatinine, Ser: 2.53 mg/dL — ABNORMAL HIGH (ref 0.61–1.24)
GFR, Estimated: 26 mL/min — ABNORMAL LOW (ref >60)
Glucose, Bld: 212 mg/dL — ABNORMAL HIGH (ref 70–99)
Potassium: 4.8 mmol/L (ref 3.5–5.1)
Sodium: 130 mmol/L — ABNORMAL LOW (ref 135–145)

## 2024-01-02 LAB — CBG MONITORING, ED
Glucose-Capillary: 240 mg/dL — ABNORMAL HIGH (ref 70–99)
Glucose-Capillary: 209 mg/dL — ABNORMAL HIGH (ref 70–99)
Glucose-Capillary: 183 mg/dL — ABNORMAL HIGH (ref 70–99)

## 2024-01-02 MED ORDER — INSULIN ASPART 100 UNIT/ML IJ SOLN: 0.0000 [IU] | Freq: Every day | INTRAMUSCULAR | Status: DC

## 2024-01-02 MED ORDER — INSULIN ASPART 100 UNIT/ML IJ SOLN
0.0000 [IU] | Freq: Three times a day (TID) | INTRAMUSCULAR | Status: DC
  Administered 2024-01-02: 3 [IU] via SUBCUTANEOUS

## 2024-01-02 NOTE — Discharge Instructions (Addendum)
 You were seen in the Emergency Department for high potassium and elevated kidney function There was blood in your stool We kept you here overnight for over 16 hours The potassium corrected and your hemoglobin was stable You should follow-up with your primary care doctor later this week Hold your potassium supplement at home and do not take this until you are instructed to resume it Continue taking all previous prescribed medications Return to the Emergency Department for severe bleeding or any other concerns

## 2024-01-02 NOTE — ED Provider Notes (Signed)
 I was asked by our hospitalist team to evaluate this patient.  He had previously been admitted for AKI, hyperkalemia, hyperglycemia and GI bleeding.  There are limited beds available and he has been here for over 16 hours.  In this time his potassium is corrected and hemoglobin has been stable.  He is able to eat and drink here without issue.  Would be appropriate for close outpatient follow-up.  Will instruct to hold potassium supplement.  Patient is in agreement with this plan.  I have let the hospitalist team know   Pamella Ozell LABOR, DO 01/02/24 [id number]

## 2024-01-02 NOTE — ED Notes (Signed)
 Spoke with ED attending. States he is cancelling admission and patient is to be discharged.

## 2024-01-02 NOTE — ED Notes (Signed)
 Patient provided with Baked Chicken [employer] meal per patient request. Patient instructed to allow several minutes for food to cool before eating.

## 2024-01-02 NOTE — ED Notes (Signed)
 Lauren with cl called to d/c transport

## 2024-01-04 NOTE — Progress Notes (Signed)
 COVID Vaccine received:  []  No [x]  Yes Date of any COVID positive Test in last 90 days: no PCP - Dr. Lamar Saba Cardiologist - Dr. Lurena Red  Chest x-ray - 10/19/23 Epic EKG -  01/03/24 Epic Stress Test - 09/14/23 Epic ECHO - 09/19/23 Epic Cardiac Cath -   Bowel Prep - [x]  No  []   Yes ______  Pacemaker / ICD device [x]  No []  Yes   Spinal Cord Stimulator:[x]  No []  Yes       History of Sleep Apnea? [x]  No []  Yes   CPAP used?- []  No []  Yes    Does the patient monitor blood sugar?          []  No [x]  Yes  []  N/A  Patient has: []  NO Hx DM   []  Pre-DM                 []  DM1  [x]   DM2 Does patient have a [employer] or Dexacom? []  No [x]  Yes   Fasting Blood Sugar Ranges- 130's Checks Blood Sugar ___4__ times a day  GLP1 agonist / usual dose - no GLP1 instructions:  SGLT-2 inhibitors / usual dose - no SGLT-2 instructions:   Blood Thinner / Instructions:Eliquis   last dose to be 01/14/24 Aspirin Instructions:  Comments:   Activity level: Patient is able  to climb a flight of stairs without difficulty; [x]  No CP  [x]  No SOB,   Patient can  perform ADLs without assistance.   Anesthesia review: HTN, DM, A-fib, Stroke, on Eliquis   Patient denies shortness of breath, fever, cough and chest pain at PAT appointment.  Patient verbalized understanding and agreement to the Pre-Surgical Instructions that were given to them at this PAT appointment. Patient was also educated of the need to review these PAT instructions again prior to his/her surgery.I reviewed the appropriate phone numbers to call if they have any and questions or concerns.

## 2024-01-04 NOTE — Patient Instructions (Addendum)
 SURGICAL WAITING ROOM VISITATION  Patients having surgery or a procedure may have no more than 2 support people in the waiting area - these visitors may rotate.    Children under the age of 49 must have an adult with them who is not the patient.  Visitors with respiratory illnesses are discouraged from visiting and should remain at home.  If the patient needs to stay at the hospital during part of their recovery, the visitor guidelines for inpatient rooms apply. Pre-op nurse will coordinate an appropriate time for 1 support person to accompany patient in pre-op.  This support person may not rotate.    Please refer to the [facility] website for the visitor guidelines for Inpatients (after your surgery is over and you are in a regular room).       Your procedure is scheduled on: 01/17/24   Report to [facility] Main Entrance    Report to admitting at 10 AM   Call this number if you have problems the morning of surgery [phone]   Do not eat food :After Midnight.   After Midnight you may have the following liquids until 9:15 AM DAY OF SURGERY  Water Non-Citrus Juices (without pulp, NO RED-Apple, White grape, White cranberry) Black Coffee (NO MILK/CREAM OR CREAMERS, sugar ok)  Clear Tea (NO MILK/CREAM OR CREAMERS, sugar ok) regular and decaf                             Plain Jell-O (NO RED)                                           Fruit ices (not with fruit pulp, NO RED)                                     Popsicles (NO RED)                                                               Sports drinks like Gatorade (NO RED)              Drink G2 drinks AT 10:00 PM the night before surgery.        The day of surgery:  Drink ONE (1) Pre-Surgery  G2 at 9:15 AM the morning of surgery. Drink in one sitting. Do not sip.  This drink was given to you during your hospital  pre-op appointment visit. Nothing else to drink after completing the  Pre-Surgery G2.          If you  have questions, please contact your surgeon's office.   FOLLOW BOWEL PREP AND ANY ADDITIONAL PRE OP INSTRUCTIONS YOU RECEIVED FROM YOUR SURGEON'S OFFICE!!!     Oral Hygiene is also important to reduce your risk of infection.                                    Remember - BRUSH YOUR TEETH THE MORNING OF SURGERY WITH YOUR REGULAR  TOOTHPASTE  DENTURES WILL BE REMOVED PRIOR TO SURGERY PLEASE DO NOT APPLY Poly grip OR ADHESIVES!!!   Do NOT smoke after Midnight   Stop all vitamins and herbal supplements 7 days before surgery.   Take these medicines the morning of surgery with A SIP OF WATER: amlodipine , atorvastatin , carvedilol , pantoprazole , Zoloft , tamsulosin , Tylenol  if needed.               Do not take Demadex (torsemide ) the morning of surgery.  DO NOT TAKE ANY ORAL DIABETIC MEDICATIONS DAY OF YOUR SURGERY Hold Trulicity  for at least 7 days prior to surgery.             You may not have any metal on your body including hair pins, jewelry, and body piercing             Do not wear make-up, lotions, powders, perfumes/cologne, or deodorant              Men may shave face and neck.   Do not bring valuables to the hospital. [facility] IS NOT             RESPONSIBLE   FOR VALUABLES.   Contacts, glasses, dentures or bridgework may not be worn into surgery.   Bring small overnight bag day of surgery.   DO NOT BRING YOUR HOME MEDICATIONS TO THE HOSPITAL. PHARMACY WILL DISPENSE MEDICATIONS LISTED ON YOUR MEDICATION LIST TO YOU DURING YOUR ADMISSION IN THE HOSPITAL!    Patients discharged on the day of surgery will not be allowed to drive home.  Someone NEEDS to stay with you for the first 24 hours after anesthesia.   Special Instructions: Bring a copy of your healthcare power of attorney and living will documents the day of surgery if you haven't scanned them before.              Please read over the following fact sheets you were given: IF YOU HAVE QUESTIONS ABOUT YOUR PRE-OP  INSTRUCTIONS PLEASE CALL [phone] [name]   If you received a COVID test during your pre-op visit  it is requested that you wear a mask when out in public, stay away from anyone that may not be feeling well and notify your surgeon if you develop symptoms. If you test positive for Covid or have been in contact with anyone that has tested positive in the last 10 days please notify you surgeon.    [place] - Preparing for Surgery Before surgery, you can play an important role.  Because skin is not sterile, your skin needs to be as free of germs as possible.  You can reduce the number of germs on your skin by washing with CHG (chlorahexidine gluconate) soap before surgery.  CHG is an antiseptic cleaner which kills germs and bonds with the skin to continue killing germs even after washing. Please DO NOT use if you have an allergy to CHG or antibacterial soaps.  If your skin becomes reddened/irritated stop using the CHG and inform your nurse when you arrive at Short Stay. Do not shave (including legs and underarms) for at least 48 hours prior to the first CHG shower.  You may shave your face/neck.  Please follow these instructions carefully:  1.  Shower with CHG Soap the night before surgery and the  morning of surgery.  2.  If you choose to wash your hair, wash your hair first as usual with your normal  shampoo.  3.  After you shampoo, rinse your hair  and body thoroughly to remove the shampoo.                             4.  Use CHG as you would any other liquid soap.  You can apply chg directly to the skin and wash.  Gently with a scrungie or clean washcloth.  5.  Apply the CHG Soap to your body ONLY FROM THE NECK DOWN.   Do   not use on face/ open                           Wound or open sores. Avoid contact with eyes, ears mouth and   genitals (private parts).                       Wash face,  Genitals (private parts) with your normal soap.             6.  Wash thoroughly, paying special attention  to the area where your    surgery  will be performed.  7.  Thoroughly rinse your body with warm water from the neck down.  8.  DO NOT shower/wash with your normal soap after using and rinsing off the CHG Soap.                9.  Pat yourself dry with a clean towel.            10.  Wear clean pajamas.            11.  Place clean sheets on your bed the night of your first shower and do not  sleep with pets. Day of Surgery : Do not apply any lotions/deodorants the morning of surgery.  Please wear clean clothes to the hospital/surgery center.  FAILURE TO FOLLOW THESE INSTRUCTIONS MAY RESULT IN THE CANCELLATION OF YOUR SURGERY  INCENTIVE SPIROMETER  An incentive spirometer is a tool that can help keep your lungs clear and active. This tool measures how well you are filling your lungs with each breath. Taking long deep breaths may help reverse or decrease the chance of developing breathing (pulmonary) problems (especially infection) following: A long period of time when you are unable to move or be active. BEFORE THE PROCEDURE  If the spirometer includes an indicator to show your best effort, your nurse or respiratory therapist will set it to a desired goal. If possible, sit up straight or lean slightly forward. Try not to slouch. Hold the incentive spirometer in an upright position. INSTRUCTIONS FOR USE  Sit on the edge of your bed if possible, or sit up as far as you can in bed or on a chair. Hold the incentive spirometer in an upright position. Breathe out normally. Place the mouthpiece in your mouth and seal your lips tightly around it. Breathe in slowly and as deeply as possible, raising the piston or the ball toward the top of the column. Hold your breath for 3-5 seconds or for as long as possible. Allow the piston or ball to fall to the bottom of the column. Remove the mouthpiece from your mouth and breathe out normally. Rest for a few seconds and repeat Steps 1 through 7 at least 10  times every 1-2 hours when you are awake. Take your time and take a few normal breaths between deep breaths. The spirometer may include an indicator to show your  best effort. Use the indicator as a goal to work toward during each repetition. After each set of 10 deep breaths, practice coughing to be sure your lungs are clear. If you have an incision (the cut made at the time of surgery), support your incision when coughing by placing a pillow or rolled up towels firmly against it. Once you are able to get out of bed, walk around indoors and cough well. You may stop using the incentive spirometer when instructed by your caregiver.  RISKS AND COMPLICATIONS Take your time so you do not get dizzy or light-headed. If you are in pain, you may need to take or ask for pain medication before doing incentive spirometry. It is harder to take a deep breath if you are having pain. AFTER USE Rest and breathe slowly and easily. It can be helpful to keep track of a log of your progress. Your caregiver can provide you with a simple table to help with this. If you are using the spirometer at home, follow these instructions: SEEK MEDICAL CARE IF:  You are having difficultly using the spirometer. You have trouble using the spirometer as often as instructed. Your pain medication is not giving enough relief while using the spirometer. You develop fever of 100.5 F (38.1 C) or higher. SEEK IMMEDIATE MEDICAL CARE IF:  You cough up bloody sputum that had not been present before. You develop fever of 102 F (38.9 C) or greater. You develop worsening pain at or near the incision site. MAKE SURE YOU:  Understand these instructions. Will watch your condition. Will get help right away if you are not doing well or get worse.   WHAT IS A BLOOD TRANSFUSION? Blood Transfusion Information  A transfusion is the replacement of blood or some of its parts. Blood is made up of multiple cells which provide different  functions. Red blood cells carry oxygen and are used for blood loss replacement. White blood cells fight against infection. Platelets control bleeding. Plasma helps clot blood. Other blood products are available for specialized needs, such as hemophilia or other clotting disorders. BEFORE THE TRANSFUSION  Who gives blood for transfusions?  Healthy volunteers who are fully evaluated to make sure their blood is safe. This is blood bank blood. Transfusion therapy is the safest it has ever been in the practice of medicine. Before blood is taken from a donor, a complete history is taken to make sure that person has no history of diseases nor engages in risky social behavior (examples are intravenous drug use or sexual activity with multiple partners). The donor's travel history is screened to minimize risk of transmitting infections, such as malaria. The donated blood is tested for signs of infectious diseases, such as HIV and hepatitis. The blood is then tested to be sure it is compatible with you in order to minimize the chance of a transfusion reaction. If you or a relative donates blood, this is often done in anticipation of surgery and is not appropriate for emergency situations. It takes many days to process the donated blood. RISKS AND COMPLICATIONS Although transfusion therapy is very safe and saves many lives, the main dangers of transfusion include:  Getting an infectious disease. Developing a transfusion reaction. This is an allergic reaction to something in the blood you were given. Every precaution is taken to prevent this. The decision to have a blood transfusion has been considered carefully by your caregiver before blood is given. Blood is not given unless the benefits outweigh the risks.  AFTER THE TRANSFUSION Right after receiving a blood transfusion, you will usually feel much better and more energetic. This is especially true if your red blood cells have gotten low (anemic). The  transfusion raises the level of the red blood cells which carry oxygen, and this usually causes an energy increase. The nurse administering the transfusion will monitor you carefully for complications. HOME CARE INSTRUCTIONS  No special instructions are needed after a transfusion. You may find your energy is better. Speak with your caregiver about any limitations on activity for underlying diseases you may have. SEEK MEDICAL CARE IF:  Your condition is not improving after your transfusion. You develop redness or irritation at the intravenous (IV) site. SEEK IMMEDIATE MEDICAL CARE IF:  Any of the following symptoms occur over the next 12 hours: Shaking chills. You have a temperature by mouth above 102 F (38.9 C), not controlled by medicine. Chest, back, or muscle pain. People around you feel you are not acting correctly or are confused. Shortness of breath or difficulty breathing. Dizziness and fainting. You get a rash or develop hives. You have a decrease in urine output. Your urine turns a dark color or changes to pink, red, or brown. Any of the following symptoms occur over the next 10 days: You have a temperature by mouth above 102 F (38.9 C), not controlled by medicine. Shortness of breath. Weakness after normal activity. The white part of the eye turns yellow (jaundice). You have a decrease in the amount of urine or are urinating less often. Your urine turns a dark color or changes to pink, red, or brown. Document Released: 04/22/2000 Document Revised: 07/18/2011 Document Reviewed: 12/10/2007 [facility] Patient Information 2014 [place], [place].How to Manage Your Diabetes Before and After Surgery  Why is it important to control my blood sugar before and after surgery? Improving blood sugar levels before and after surgery helps healing and can limit problems. A way of improving blood sugar control is eating a healthy diet by:  Eating less sugar and carbohydrates  Increasing  activity/exercise  Talking with your doctor about reaching your blood sugar goals High blood sugars (greater than 180 mg/dL) can raise your risk of infections and slow your recovery, so you will need to focus on controlling your diabetes during the weeks before surgery. Make sure that the doctor who takes care of your diabetes knows about your planned surgery including the date and location.  How do I manage my blood sugar before surgery? Check your blood sugar at least 4 times a day, starting 2 days before surgery, to make sure that the level is not too high or low. Check your blood sugar the morning of your surgery when you wake up and every 2 hours until you get to the Short Stay unit. If your blood sugar is less than 70 mg/dL, you will need to treat for low blood sugar: Do not take insulin . Treat a low blood sugar (less than 70 mg/dL) with  cup of clear juice (cranberry or apple), 4 glucose tablets, OR glucose gel. Recheck blood sugar in 15 minutes after treatment (to make sure it is greater than 70 mg/dL). If your blood sugar is not greater than 70 mg/dL on recheck, call [phone] for further instructions. Report your blood sugar to the short stay nurse when you get to Short Stay.  If you are admitted to the hospital after surgery: Your blood sugar will be checked by the staff and you will probably be given insulin  after surgery (instead  of oral diabetes medicines) to make sure you have good blood sugar levels. The goal for blood sugar control after surgery is 80-180 mg/dL.   WHAT DO I DO ABOUT MY DIABETES MEDICATION?  Do not take oral diabetes medicines (pills) the morning of surgery.  THE NIGHT BEFORE SURGERY, take only half of Lantus  insulin  dose at bedtime.       THE MORNING OF SURGERY, if blood suger is >220 take half of Humalog  insulin  but if less than 220 do not take any morning insulin .  DO NOT TAKE THE FOLLOWING 7 DAYS PRIOR TO SURGERY: Ozempic, Wegovy, Rybelsus  (Semaglutide), Byetta (exenatide), Bydureon (exenatide ER), Victoza, Saxenda (liraglutide), or Trulicity  (dulaglutide ) Mounjaro (Tirzepatide) Adlyxin (Lixisenatide), Polyethylene Glycol Loxenatide.  Patient Signature:  Date:   Nurse Signature:  Date:

## 2024-01-05 ENCOUNTER — Encounter (HOSPITAL_COMMUNITY): Payer: Self-pay

## 2024-01-05 ENCOUNTER — Other Ambulatory Visit: Payer: Self-pay

## 2024-01-05 ENCOUNTER — Encounter (HOSPITAL_COMMUNITY)
Admission: RE | Admit: 2024-01-05 | Discharge: 2024-01-05 | Disposition: A | Discharge status: Home / Self Care | Source: Ambulatory Visit | Attending: Surgery | Admitting: Surgery

## 2024-01-05 VITALS — BP 118/81 | HR 66 | Temp 97.5°F | Resp 16 | Ht 71.0 in | Wt 185.0 lb

## 2024-01-05 DIAGNOSIS — Z01812 Encounter for preprocedural laboratory examination: Secondary | ICD-10-CM | POA: Diagnosis present

## 2024-01-05 DIAGNOSIS — E1122 Type 2 diabetes mellitus with diabetic chronic kidney disease: Secondary | ICD-10-CM | POA: Insufficient documentation

## 2024-01-05 DIAGNOSIS — Z01818 Encounter for other preprocedural examination: Secondary | ICD-10-CM

## 2024-01-05 DIAGNOSIS — R739 Hyperglycemia, unspecified: Secondary | ICD-10-CM

## 2024-01-05 DIAGNOSIS — Z794 Long term (current) use of insulin: Secondary | ICD-10-CM | POA: Diagnosis not present

## 2024-01-05 HISTORY — DX: Depression, unspecified: F32.A

## 2024-01-05 HISTORY — DX: Unspecified osteoarthritis, unspecified site: M19.90

## 2024-01-05 HISTORY — DX: Cardiac arrhythmia, unspecified: I49.9

## 2024-01-05 HISTORY — DX: Cerebral infarction, unspecified: I63.9

## 2024-01-05 LAB — TYPE AND SCREEN
ABO/RH(D): O POS
Antibody Screen: NEGATIVE

## 2024-01-05 LAB — GLUCOSE, CAPILLARY: Glucose-Capillary: 137 mg/dL — ABNORMAL HIGH (ref 70–99)

## 2024-01-06 LAB — HEMOGLOBIN A1C
Hgb A1c MFr Bld: 10.5 % — ABNORMAL HIGH (ref 4.8–5.6)
Mean Plasma Glucose: 255 mg/dL

## 2024-01-09 NOTE — Progress Notes (Signed)
 Request sent to Dr. CANDIE Schultze to review pt's HgbA1C.

## 2024-01-11 ENCOUNTER — Telehealth: Payer: Self-pay | Admitting: Nurse Practitioner

## 2024-01-11 NOTE — Telephone Encounter (Signed)
 Left message for patient, the appointment tomorrow is a follow up and they were concerned about his statis at that time. Explained it maybe a good idea to come in just to make sure everything is okay prior to surgery. Surgical clearance has already been given. Patient was given our phone number to call to cancel his appointment if he decides not to come.

## 2024-01-11 NOTE — Telephone Encounter (Signed)
 Patient wants to confirm if he needs the appointment tomorrow as he is having surgery on Wednesday, 9/10.

## 2024-01-11 NOTE — Progress Notes (Deleted)
 Cardiology Office Note    Patient Name: Cory [name] Date of Encounter: 01/11/2024  Primary Care Provider:  [name], MD Primary Cardiologist:  Lurena MARLA Red, MD Primary Electrophysiologist: None   Past Medical History    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   Prostate CA (HCC)    Prostate CA (HCC)    Sepsis (HCC) 11/27/2020   Stroke (HCC)     History of Present Illness  Cory [name] is a 78 y.o. 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  Mr. Cory [name] was last seen on 10/12/2023 for posthospital follow-up.  He developed unresponsiveness and was treated for hypoglycemia as well as aspiration pneumonia.  He also experienced episode of AF with RVR.  He was euvolemic on examination and was last seen in the ED on 01/01/2024 with complaint of abnormal labs.  He also endorsed some blood in his stool and was scheduled for colectomy for colon cancer on 01/05/2024.  He was found to have acute kidney injury and EKG was completed to rule out hyperkalemia with fluid resuscitation completed Lokelma  ordered.  He was discharged and instructed to hold potassium supplement and follow-up outpatient.  He is currently scheduled for a colectomy on 01/17/2024.  Patient denies chest pain, palpitations, dyspnea, PND, orthopnea, nausea, vomiting, dizziness, syncope, edema, weight gain, or early satiety.   Discussed the  use of AI scribe software for clinical note transcription with the patient, who gave verbal consent to proceed.  History of Present Illness    ***Notes:   Review of Systems  Please see the history of present illness.    All other systems reviewed and are otherwise negative except as noted above.  Physical Exam    Wt Readings from Last 3 Encounters:  01/05/24 185 lb (83.9 kg)  01/01/24 182 lb (82.6 kg)  11/23/23 183 lb 8.5 oz (83.3 kg)   CD:Uyzmz were no vitals filed for this visit.,There is no height or weight on file to calculate BMI. GEN: Well nourished, well developed in no acute distress Neck: No JVD; No carotid bruits Pulmonary: Clear to auscultation without rales, wheezing or rhonchi  Cardiovascular: Normal rate. Regular rhythm. Normal S1. Normal S2.   Murmurs: There is no murmur.  ABDOMEN: Soft, non-tender, non-distended EXTREMITIES:  No edema; No deformity   EKG/LABS/ Recent Cardiac Studies   ECG personally reviewed by me today - ***  Risk Assessment/Calculations:   {Does this patient have ATRIAL FIBRILLATION?[phone]}      Lab Results  Component Value Date   WBC 4.3 01/02/2024   HGB 10.7 (L) 01/02/2024   HCT 32.2 (L) 01/02/2024   MCV 80.9 01/02/2024   PLT 165 01/02/2024   Lab Results  Component Value Date   CREATININE 2.53 (H) 01/02/2024   BUN 39 (H) 01/02/2024  NA 130 (L) 01/02/2024   K 4.8 01/02/2024   CL 100 01/02/2024   CO2 21 (L) 01/02/2024   Lab Results  Component Value Date   CHOL 116 10/20/2023   HDL 42 10/20/2023   LDLCALC 54 10/20/2023   TRIG 98 10/20/2023   CHOLHDL 2.8 10/20/2023    Lab Results  Component Value Date   HGBA1C 10.5 (H) 01/05/2024   Assessment & Plan    Assessment and Plan Assessment & Plan     1. HFmrEF:   2.  Preop clearance  3.PAF:   4.Essential hypertension: - Patient's blood pressure today was  5.  Hyperkalemia      Disposition: Follow-up with Arun K Thukkani, MD or APP in *** months {Are  you ordering a CV Procedure (e.g. stress test, cath, DCCV, TEE, etc)?   Press F2        :[id number]}   Signed, Wyn Raddle, Jackee Shove, NP 01/11/2024, 1:16 PM [facility]

## 2024-01-12 ENCOUNTER — Ambulatory Visit: Attending: Nurse Practitioner | Admitting: Nurse Practitioner

## 2024-01-12 DIAGNOSIS — I483 Typical atrial flutter: Secondary | ICD-10-CM

## 2024-01-12 DIAGNOSIS — E875 Hyperkalemia: Secondary | ICD-10-CM

## 2024-01-12 DIAGNOSIS — I5020 Unspecified systolic (congestive) heart failure: Secondary | ICD-10-CM

## 2024-01-12 DIAGNOSIS — E1159 Type 2 diabetes mellitus with other circulatory complications: Secondary | ICD-10-CM

## 2024-01-12 DIAGNOSIS — Z01810 Encounter for preprocedural cardiovascular examination: Secondary | ICD-10-CM

## 2024-01-15 NOTE — Progress Notes (Signed)
 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 Standing Sheldon.   In May 2025, he was admitted to the hospital after being found unresponsive following a fall. He experienced hypoglycemia and was diagnosed with encephalopathy. Tests ruled out a seizure, but an echocardiogram indicated decreased heart function. He was treated for suspected pneumonia after vomiting and possible aspiration. During this hospitalization, he experienced a recurrence of atrial fibrillation. He was started on Bidil  and carvedilol  and was discharged to a skilled nursing facility on Sep 29, 2023.   Pt seen by cardiology 10/12/2023. Euvolemic on exam. Per preoperative evaluation 10/20/2023, Chart reviewed as part of pre-operative protocol coverage. Patient was seen by Jackee Alberts NP on 10/12/23. Per his office note, The patient affirms he has been doing well without any new cardiac symptoms. They are able to achieve 4 METS without cardiac limitations. Therefore, based on ACC/AHA guidelines, the patient would be at acceptable risk for the planned procedure without further cardiovascular testing. The patient was advised that if he develops new symptoms prior to surgery to contact our office to arrange for a follow-up visit, and he verbalized understanding.   Eliquis  recommendations were  discussed with PharmD. Per office protocol, patient can hold Eliquis  for 3 days prior to procedure. Plan to resume eliquis  once adequate hemostasis has been achieved.   Pt was sent to the ED by PCP 8/25 due to hyperkalemia.  Initially, plan was for admission, but due to limited beds and correction of potassium decision was made not to admit.  Advised to hold potassium supplementation. Will recheck labs DOS.  VS: BP 118/81   Pulse 66   Temp (!) 36.4 C (Oral)   Resp 16   Ht 5' 11 (1.803 m)   Wt 83.9 kg   SpO2 100%   BMI 25.80 kg/m   PROVIDERS: [name], MD is PCP   Cardiologist - Dr. Lurena Red  LABS: Labs reviewed: Acceptable for surgery. (all labs ordered are listed, but only abnormal results are displayed)  Labs Reviewed  GLUCOSE, CAPILLARY - Abnormal; Notable for the following components:      Result Value   Glucose-Capillary 137 (*)    All other components within normal limits  HEMOGLOBIN A1C - Abnormal; Notable for the following components:   Hgb A1c MFr Bld 10.5 (*)    All other components within normal limits  TYPE AND SCREEN     IMAGES:   EKG:   CV: Echo 09/19/23 1. Left ventricular ejection fraction, by estimation, is 25 to 30%. The  left ventricle has severely decreased function. The left ventricle  demonstrates global hypokinesis. There is mild concentric left ventricular  hypertrophy. Left ventricular diastolic   parameters are indeterminate.   2. Right ventricular systolic function is mildly reduced. The right  ventricular size is normal.  3. The mitral valve is normal in structure. No evidence of mitral valve  regurgitation. No evidence of mitral stenosis.   4. The aortic valve was not well visualized. Aortic valve regurgitation  is not visualized. No aortic stenosis is present.   5. The inferior vena cava is normal in size with <50% respiratory  variability, suggesting right atrial pressure of 8 mmHg.   Myocardial Perfusion  09/14/2023   The study is normal. The study is low risk.   No ST deviation was noted.   Left ventricular function is normal. Nuclear stress EF: 58%. The left ventricular ejection fraction is normal (55-65%). End diastolic cavity size is normal.   CT images were obtained for attenuation correction and were examined for the presence of coronary calcium  when appropriate.   Coronary calcium  was present on the attenuation correction CT images. Mild coronary calcifications were present. Coronary calcifications were present in the left anterior descending artery distribution(s).   Prior study not available for comparison. 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   Prostate CA (HCC)    Prostate CA (HCC)    Sepsis (HCC) 11/27/2020   Stroke [facility])     Past Surgical History:  Procedure Laterality Date   AMPUTATION Right 12/04/2020   Procedure: RIGHT BELOW KNEE AMPUTATION;  Surgeon: [name], MD;  Location: [facility] OR;  Service: Orthopedics;  Laterality: Right;   APPENDECTOMY     APPLICATION OF WOUND VAC Right 12/04/2020   Procedure: APPLICATION OF WOUND VAC;  Surgeon: [name], MD;  Location: [facility];  Service: Orthopedics;  Laterality: Right;   MINOR HEMORRHOIDECTOMY      MEDICATIONS:  acetaminophen  (TYLENOL ) 325 MG tablet   amLODipine  (NORVASC ) 5 MG tablet   apixaban  (ELIQUIS ) 5 MG TABS tablet   atorvastatin  (LIPITOR) 20 MG tablet   BISACODYL  5 MG EC tablet   carvedilol  (COREG ) 25 MG tablet   Dulaglutide  1.5 MG/0.5ML SOAJ   insulin  lispro (HUMALOG ) 100 UNIT/ML injection    LANTUS  SOLOSTAR 100 UNIT/ML Solostar Pen   ondansetron  (ZOFRAN ) 4 MG tablet   pantoprazole  (PROTONIX ) 40 MG tablet   potassium chloride  (KLOR-CON ) 10 MEQ tablet   sertraline  (ZOLOFT ) 50 MG tablet   spironolactone  (ALDACTONE ) 100 MG tablet   tamsulosin  (FLOMAX ) 0.4 MG CAPS capsule   telmisartan  (MICARDIS ) 40 MG tablet   torsemide  (DEMADEX ) 20 MG tablet   Vitamin D , Ergocalciferol , (DRISDOL ) 1.25 MG (50000 UNIT) CAPS capsule   No current facility-administered medications for this encounter.     Harlene Hoots Ward, PA-C [facility] Pre-Surgical Testing [phone]

## 2024-01-17 ENCOUNTER — Other Ambulatory Visit: Payer: Self-pay

## 2024-01-17 ENCOUNTER — Inpatient Hospital Stay (HOSPITAL_COMMUNITY): Payer: Self-pay | Admitting: Physician Assistant

## 2024-01-17 ENCOUNTER — Encounter (HOSPITAL_COMMUNITY)
Admission: RE | Disposition: A | Discharge status: Home Health | Payer: Self-pay | Source: Home / Self Care | Attending: Surgery

## 2024-01-17 ENCOUNTER — Encounter (HOSPITAL_COMMUNITY): Payer: Self-pay | Admitting: Surgery

## 2024-01-17 ENCOUNTER — Inpatient Hospital Stay (HOSPITAL_COMMUNITY): Payer: Self-pay

## 2024-01-17 ENCOUNTER — Inpatient Hospital Stay (HOSPITAL_COMMUNITY)
Admission: RE | Admit: 2024-01-17 | Discharge: 2024-01-24 | DRG: 330 | Disposition: A | Discharge status: Home Health | Attending: Surgery | Admitting: Surgery

## 2024-01-17 DIAGNOSIS — D500 Iron deficiency anemia secondary to blood loss (chronic): Secondary | ICD-10-CM | POA: Diagnosis present

## 2024-01-17 DIAGNOSIS — I130 Hypertensive heart and chronic kidney disease with heart failure and stage 1 through stage 4 chronic kidney disease, or unspecified chronic kidney disease: Secondary | ICD-10-CM | POA: Diagnosis not present

## 2024-01-17 DIAGNOSIS — E785 Hyperlipidemia, unspecified: Secondary | ICD-10-CM | POA: Diagnosis present

## 2024-01-17 DIAGNOSIS — I482 Chronic atrial fibrillation, unspecified: Secondary | ICD-10-CM | POA: Diagnosis present

## 2024-01-17 DIAGNOSIS — N184 Chronic kidney disease, stage 4 (severe): Secondary | ICD-10-CM | POA: Diagnosis present

## 2024-01-17 DIAGNOSIS — Z87891 Personal history of nicotine dependence: Secondary | ICD-10-CM

## 2024-01-17 DIAGNOSIS — F32A Depression, unspecified: Secondary | ICD-10-CM | POA: Diagnosis present

## 2024-01-17 DIAGNOSIS — Z8546 Personal history of malignant neoplasm of prostate: Secondary | ICD-10-CM | POA: Diagnosis not present

## 2024-01-17 DIAGNOSIS — E1165 Type 2 diabetes mellitus with hyperglycemia: Secondary | ICD-10-CM | POA: Diagnosis present

## 2024-01-17 DIAGNOSIS — Z6825 Body mass index (BMI) 25.0-25.9, adult: Secondary | ICD-10-CM

## 2024-01-17 DIAGNOSIS — E663 Overweight: Secondary | ICD-10-CM | POA: Diagnosis present

## 2024-01-17 DIAGNOSIS — Z7901 Long term (current) use of anticoagulants: Secondary | ICD-10-CM

## 2024-01-17 DIAGNOSIS — L89152 Pressure ulcer of sacral region, stage 2: Secondary | ICD-10-CM | POA: Diagnosis present

## 2024-01-17 DIAGNOSIS — Z91013 Allergy to seafood: Secondary | ICD-10-CM

## 2024-01-17 DIAGNOSIS — Z8616 Personal history of COVID-19: Secondary | ICD-10-CM | POA: Diagnosis not present

## 2024-01-17 DIAGNOSIS — C187 Malignant neoplasm of sigmoid colon: Secondary | ICD-10-CM

## 2024-01-17 DIAGNOSIS — I5020 Unspecified systolic (congestive) heart failure: Secondary | ICD-10-CM

## 2024-01-17 DIAGNOSIS — Z794 Long term (current) use of insulin: Secondary | ICD-10-CM | POA: Diagnosis not present

## 2024-01-17 DIAGNOSIS — Y842 Radiological procedure and radiotherapy as the cause of abnormal reaction of the patient, or of later complication, without mention of misadventure at the time of the procedure: Secondary | ICD-10-CM | POA: Diagnosis present

## 2024-01-17 DIAGNOSIS — Z923 Personal history of irradiation: Secondary | ICD-10-CM

## 2024-01-17 DIAGNOSIS — E1151 Type 2 diabetes mellitus with diabetic peripheral angiopathy without gangrene: Secondary | ICD-10-CM | POA: Diagnosis present

## 2024-01-17 DIAGNOSIS — C188 Malignant neoplasm of overlapping sites of colon: Secondary | ICD-10-CM | POA: Diagnosis present

## 2024-01-17 DIAGNOSIS — I483 Typical atrial flutter: Secondary | ICD-10-CM | POA: Diagnosis present

## 2024-01-17 DIAGNOSIS — Z8673 Personal history of transient ischemic attack (TIA), and cerebral infarction without residual deficits: Secondary | ICD-10-CM

## 2024-01-17 DIAGNOSIS — E11649 Type 2 diabetes mellitus with hypoglycemia without coma: Secondary | ICD-10-CM | POA: Diagnosis not present

## 2024-01-17 DIAGNOSIS — E16A2 Hypoglycemia level 2: Secondary | ICD-10-CM | POA: Diagnosis not present

## 2024-01-17 DIAGNOSIS — Z91014 Allergy to mammalian meats: Secondary | ICD-10-CM

## 2024-01-17 DIAGNOSIS — N1832 Chronic kidney disease, stage 3b: Secondary | ICD-10-CM | POA: Diagnosis not present

## 2024-01-17 DIAGNOSIS — Z91018 Allergy to other foods: Secondary | ICD-10-CM

## 2024-01-17 DIAGNOSIS — N3041 Irradiation cystitis with hematuria: Secondary | ICD-10-CM | POA: Diagnosis present

## 2024-01-17 DIAGNOSIS — M1990 Unspecified osteoarthritis, unspecified site: Secondary | ICD-10-CM | POA: Diagnosis present

## 2024-01-17 DIAGNOSIS — K219 Gastro-esophageal reflux disease without esophagitis: Secondary | ICD-10-CM | POA: Diagnosis present

## 2024-01-17 DIAGNOSIS — C185 Malignant neoplasm of splenic flexure: Secondary | ICD-10-CM

## 2024-01-17 DIAGNOSIS — D509 Iron deficiency anemia, unspecified: Secondary | ICD-10-CM | POA: Diagnosis present

## 2024-01-17 DIAGNOSIS — Z8249 Family history of ischemic heart disease and other diseases of the circulatory system: Secondary | ICD-10-CM | POA: Diagnosis not present

## 2024-01-17 DIAGNOSIS — E1122 Type 2 diabetes mellitus with diabetic chronic kidney disease: Secondary | ICD-10-CM | POA: Diagnosis present

## 2024-01-17 DIAGNOSIS — Z9713 Presence of artificial right leg (complete) (partial): Secondary | ICD-10-CM | POA: Diagnosis not present

## 2024-01-17 DIAGNOSIS — N400 Enlarged prostate without lower urinary tract symptoms: Secondary | ICD-10-CM | POA: Diagnosis present

## 2024-01-17 DIAGNOSIS — Z89511 Acquired absence of right leg below knee: Secondary | ICD-10-CM | POA: Diagnosis not present

## 2024-01-17 DIAGNOSIS — I10 Essential (primary) hypertension: Secondary | ICD-10-CM | POA: Diagnosis present

## 2024-01-17 DIAGNOSIS — Z803 Family history of malignant neoplasm of breast: Secondary | ICD-10-CM

## 2024-01-17 DIAGNOSIS — Z833 Family history of diabetes mellitus: Secondary | ICD-10-CM

## 2024-01-17 DIAGNOSIS — Z87440 Personal history of urinary (tract) infections: Secondary | ICD-10-CM

## 2024-01-17 DIAGNOSIS — Z79899 Other long term (current) drug therapy: Secondary | ICD-10-CM

## 2024-01-17 DIAGNOSIS — C189 Malignant neoplasm of colon, unspecified: Principal | ICD-10-CM | POA: Insufficient documentation

## 2024-01-17 DIAGNOSIS — I4892 Unspecified atrial flutter: Secondary | ICD-10-CM | POA: Diagnosis present

## 2024-01-17 HISTORY — PX: FLEXIBLE SIGMOIDOSCOPY: SHX5431

## 2024-01-17 LAB — GLUCOSE, CAPILLARY
Glucose-Capillary: 187 mg/dL — ABNORMAL HIGH (ref 70–99)
Glucose-Capillary: 250 mg/dL — ABNORMAL HIGH (ref 70–99)
Glucose-Capillary: 256 mg/dL — ABNORMAL HIGH (ref 70–99)
Glucose-Capillary: 258 mg/dL — ABNORMAL HIGH (ref 70–99)
Glucose-Capillary: 278 mg/dL — ABNORMAL HIGH (ref 70–99)
Glucose-Capillary: 99 mg/dL (ref 70–99)

## 2024-01-17 SURGERY — COLECTOMY, PARTIAL, ROBOT-ASSISTED, LAPAROSCOPIC
Anesthesia: General

## 2024-01-17 MED ORDER — FENTANYL CITRATE (PF) 100 MCG/2ML IJ SOLN
INTRAMUSCULAR | Status: DC | PRN
  Administered 2024-01-17: 100 ug via INTRAVENOUS
  Administered 2024-01-17: 25 ug via INTRAVENOUS
  Administered 2024-01-17: 50 ug via INTRAVENOUS

## 2024-01-17 MED ORDER — MELATONIN 3 MG PO TABS
3.0000 mg | ORAL_TABLET | Freq: Every evening | ORAL | Status: DC | PRN
  Administered 2024-01-18: 3 mg via ORAL
  Filled 2024-01-17: qty 1

## 2024-01-17 MED ORDER — FENTANYL CITRATE (PF) 100 MCG/2ML IJ SOLN
INTRAMUSCULAR | Status: AC
  Filled 2024-01-17: qty 2

## 2024-01-17 MED ORDER — GLUCERNA SHAKE PO LIQD
237.0000 mL | Freq: Two times a day (BID) | ORAL | Status: DC
  Administered 2024-01-18 – 2024-01-23 (×4): 237 mL via ORAL
  Filled 2024-01-17 (×15): qty 237

## 2024-01-17 MED ORDER — CHLORHEXIDINE GLUCONATE 0.12 % MT SOLN
15.0000 mL | Freq: Once | OROMUCOSAL | Status: AC
  Administered 2024-01-17: 15 mL via OROMUCOSAL

## 2024-01-17 MED ORDER — ENOXAPARIN SODIUM 40 MG/0.4ML IJ SOSY
40.0000 mg | PREFILLED_SYRINGE | Freq: Once | INTRAMUSCULAR | Status: AC
  Administered 2024-01-17: 40 mg via SUBCUTANEOUS
  Filled 2024-01-17: qty 0.4

## 2024-01-17 MED ORDER — METRONIDAZOLE 500 MG PO TABS: 1000.0000 mg | ORAL_TABLET | ORAL | Status: DC

## 2024-01-17 MED ORDER — EPHEDRINE SULFATE-NACL 50-0.9 MG/10ML-% IV SOSY
PREFILLED_SYRINGE | INTRAVENOUS | Status: DC | PRN
  Administered 2024-01-17: 15 mg via INTRAVENOUS
  Administered 2024-01-17: 5 mg via INTRAVENOUS
  Administered 2024-01-17: 10 mg via INTRAVENOUS

## 2024-01-17 MED ORDER — ONDANSETRON HCL 4 MG/2ML IJ SOLN
4.0000 mg | Freq: Four times a day (QID) | INTRAMUSCULAR | Status: DC | PRN
  Administered 2024-01-18: 4 mg via INTRAVENOUS
  Filled 2024-01-17: qty 2

## 2024-01-17 MED ORDER — METOPROLOL TARTRATE 5 MG/5ML IV SOLN: 5.0000 mg | Freq: Four times a day (QID) | INTRAVENOUS | Status: DC | PRN

## 2024-01-17 MED ORDER — ATORVASTATIN CALCIUM 20 MG PO TABS
20.0000 mg | ORAL_TABLET | Freq: Every day | ORAL | Status: DC
  Administered 2024-01-18 – 2024-01-23 (×6): 20 mg via ORAL
  Filled 2024-01-17 (×6): qty 1

## 2024-01-17 MED ORDER — HYDROMORPHONE HCL 1 MG/ML IJ SOLN
0.5000 mg | INTRAMUSCULAR | Status: DC | PRN
  Administered 2024-01-17 – 2024-01-18 (×2): 1 mg via INTRAVENOUS
  Filled 2024-01-17 (×2): qty 1

## 2024-01-17 MED ORDER — PROPOFOL 10 MG/ML IV BOLUS
INTRAVENOUS | Status: AC
  Filled 2024-01-17: qty 20

## 2024-01-17 MED ORDER — 0.9 % SODIUM CHLORIDE (POUR BTL) OPTIME
TOPICAL | Status: DC | PRN
  Administered 2024-01-17: 2000 mL

## 2024-01-17 MED ORDER — NOREPINEPHRINE 4 MG/250ML-% IV SOLN
0.0000 ug/min | INTRAVENOUS | Status: DC
  Filled 2024-01-17: qty 250

## 2024-01-17 MED ORDER — ALVIMOPAN 12 MG PO CAPS
12.0000 mg | ORAL_CAPSULE | ORAL | Status: AC
  Administered 2024-01-17: 12 mg via ORAL
  Filled 2024-01-17: qty 1

## 2024-01-17 MED ORDER — BUPIVACAINE-EPINEPHRINE (PF) 0.25% -1:200000 IJ SOLN
INTRAMUSCULAR | Status: AC
  Filled 2024-01-17: qty 60

## 2024-01-17 MED ORDER — ENOXAPARIN SODIUM 30 MG/0.3ML IJ SOSY
30.0000 mg | PREFILLED_SYRINGE | INTRAMUSCULAR | Status: DC
  Administered 2024-01-18: 30 mg via SUBCUTANEOUS
  Filled 2024-01-17 (×2): qty 0.3

## 2024-01-17 MED ORDER — SUGAMMADEX SODIUM 200 MG/2ML IV SOLN
INTRAVENOUS | Status: DC | PRN
Start: 2024-01-17 — End: 2024-01-17
  Administered 2024-01-17: 200 mg via INTRAVENOUS

## 2024-01-17 MED ORDER — KCL IN DEXTROSE-NACL 20-5-0.45 MEQ/L-%-% IV SOLN
INTRAVENOUS | Status: DC
  Filled 2024-01-17: qty 1000

## 2024-01-17 MED ORDER — INSULIN ASPART 100 UNIT/ML IJ SOLN: 0.0000 [IU] | INTRAMUSCULAR | Status: DC | PRN

## 2024-01-17 MED ORDER — ALUM & MAG HYDROXIDE-SIMETH 200-200-20 MG/5ML PO SUSP: 30.0000 mL | Freq: Four times a day (QID) | ORAL | Status: DC | PRN

## 2024-01-17 MED ORDER — VITAMIN D (ERGOCALCIFEROL) 1.25 MG (50000 UNIT) PO CAPS
50000.0000 [IU] | ORAL_CAPSULE | ORAL | Status: DC
  Administered 2024-01-21: 50000 [IU] via ORAL
  Filled 2024-01-17 (×2): qty 1

## 2024-01-17 MED ORDER — BUPIVACAINE-EPINEPHRINE (PF) 0.25% -1:200000 IJ SOLN
INTRAMUSCULAR | Status: DC | PRN
  Administered 2024-01-17: 60 mL

## 2024-01-17 MED ORDER — TORSEMIDE 20 MG PO TABS
20.0000 mg | ORAL_TABLET | Freq: Every day | ORAL | Status: DC
  Administered 2024-01-17 – 2024-01-24 (×8): 20 mg via ORAL
  Filled 2024-01-17 (×8): qty 1

## 2024-01-17 MED ORDER — PANTOPRAZOLE SODIUM 40 MG PO TBEC
40.0000 mg | DELAYED_RELEASE_TABLET | Freq: Every day | ORAL | Status: DC
Start: 2024-01-18 — End: 2024-01-24
  Administered 2024-01-18 – 2024-01-24 (×7): 40 mg via ORAL
  Filled 2024-01-17 (×7): qty 1

## 2024-01-17 MED ORDER — TRAMADOL HCL 50 MG PO TABS
50.0000 mg | ORAL_TABLET | Freq: Two times a day (BID) | ORAL | Status: DC | PRN
  Administered 2024-01-19: 100 mg via ORAL
  Filled 2024-01-17: qty 2

## 2024-01-17 MED ORDER — TAMSULOSIN HCL 0.4 MG PO CAPS
0.4000 mg | ORAL_CAPSULE | Freq: Every day | ORAL | Status: DC
Start: 2024-01-18 — End: 2024-01-24
  Administered 2024-01-18 – 2024-01-24 (×7): 0.4 mg via ORAL
  Filled 2024-01-17 (×7): qty 1

## 2024-01-17 MED ORDER — ORAL CARE MOUTH RINSE: 15.0000 mL | Freq: Once | OROMUCOSAL | Status: AC

## 2024-01-17 MED ORDER — SALINE SPRAY 0.65 % NA SOLN: 1.0000 | Freq: Four times a day (QID) | NASAL | Status: DC | PRN

## 2024-01-17 MED ORDER — PROPOFOL 10 MG/ML IV BOLUS
INTRAVENOUS | Status: DC | PRN
  Administered 2024-01-17: 100 mg via INTRAVENOUS

## 2024-01-17 MED ORDER — PHENOL 1.4 % MT LIQD: 2.0000 | OROMUCOSAL | Status: DC | PRN

## 2024-01-17 MED ORDER — SODIUM CHLORIDE 0.9 % IV SOLN: Freq: Three times a day (TID) | INTRAVENOUS | Status: AC | PRN

## 2024-01-17 MED ORDER — MAGIC MOUTHWASH: 15.0000 mL | Freq: Four times a day (QID) | ORAL | Status: DC | PRN

## 2024-01-17 MED ORDER — NAPHAZOLINE-GLYCERIN 0.012-0.25 % OP SOLN: 1.0000 [drp] | Freq: Four times a day (QID) | OPHTHALMIC | Status: DC | PRN

## 2024-01-17 MED ORDER — GABAPENTIN 100 MG PO CAPS
200.0000 mg | ORAL_CAPSULE | ORAL | Status: AC
  Administered 2024-01-17: 200 mg via ORAL
  Filled 2024-01-17: qty 2

## 2024-01-17 MED ORDER — ACETAMINOPHEN 500 MG PO TABS
1000.0000 mg | ORAL_TABLET | Freq: Four times a day (QID) | ORAL | Status: DC
  Administered 2024-01-17 – 2024-01-22 (×15): 1000 mg via ORAL
  Filled 2024-01-17 (×20): qty 2

## 2024-01-17 MED ORDER — HYDRALAZINE HCL 20 MG/ML IJ SOLN: 10.0000 mg | INTRAMUSCULAR | Status: DC | PRN

## 2024-01-17 MED ORDER — MENTHOL 3 MG MT LOZG: 1.0000 | LOZENGE | OROMUCOSAL | Status: DC | PRN

## 2024-01-17 MED ORDER — PROCHLORPERAZINE MALEATE 10 MG PO TABS: 10.0000 mg | ORAL_TABLET | Freq: Four times a day (QID) | ORAL | Status: DC | PRN

## 2024-01-17 MED ORDER — ENSURE PRE-SURGERY PO LIQD: 592.0000 mL | Freq: Once | ORAL | Status: DC

## 2024-01-17 MED ORDER — PHENYLEPHRINE 80 MCG/ML (10ML) SYRINGE FOR IV PUSH (FOR BLOOD PRESSURE SUPPORT)
PREFILLED_SYRINGE | INTRAVENOUS | Status: DC | PRN
Start: 2024-01-17 — End: 2024-01-17
  Administered 2024-01-17: 80 ug via INTRAVENOUS
  Administered 2024-01-17: 120 ug via INTRAVENOUS
  Administered 2024-01-17 (×2): 160 ug via INTRAVENOUS
  Administered 2024-01-17 (×2): 80 ug via INTRAVENOUS

## 2024-01-17 MED ORDER — BISACODYL 5 MG PO TBEC: 20.0000 mg | DELAYED_RELEASE_TABLET | Freq: Once | ORAL | Status: DC

## 2024-01-17 MED ORDER — ALBUMIN HUMAN 5 % IV SOLN: INTRAVENOUS | Status: DC | PRN

## 2024-01-17 MED ORDER — CEFOTETAN DISODIUM 2 G IJ SOLR
2.0000 g | Freq: Two times a day (BID) | INTRAMUSCULAR | Status: AC
  Administered 2024-01-17: 2 g via INTRAVENOUS
  Filled 2024-01-17: qty 2

## 2024-01-17 MED ORDER — ONDANSETRON HCL 4 MG PO TABS: 4.0000 mg | ORAL_TABLET | Freq: Four times a day (QID) | ORAL | Status: DC | PRN

## 2024-01-17 MED ORDER — DIPHENHYDRAMINE HCL 12.5 MG/5ML PO ELIX: 12.5000 mg | ORAL_SOLUTION | Freq: Four times a day (QID) | ORAL | Status: DC | PRN

## 2024-01-17 MED ORDER — SPIRONOLACTONE 100 MG PO TABS
100.0000 mg | ORAL_TABLET | Freq: Every day | ORAL | Status: DC
  Administered 2024-01-17 – 2024-01-23 (×7): 100 mg via ORAL
  Filled 2024-01-17 (×7): qty 1
  Filled 2024-01-17: qty 4
  Filled 2024-01-17: qty 1

## 2024-01-17 MED ORDER — ACETAMINOPHEN 500 MG PO TABS
1000.0000 mg | ORAL_TABLET | ORAL | Status: AC
  Administered 2024-01-17: 1000 mg via ORAL
  Filled 2024-01-17: qty 2

## 2024-01-17 MED ORDER — POLYETHYLENE GLYCOL 3350 17 GM/SCOOP PO POWD: 238.0000 g | Freq: Once | ORAL | Status: DC

## 2024-01-17 MED ORDER — METOPROLOL TARTRATE 5 MG/5ML IV SOLN: 5.0000 mg | INTRAVENOUS | Status: DC | PRN

## 2024-01-17 MED ORDER — PHENYLEPHRINE 80 MCG/ML (10ML) SYRINGE FOR IV PUSH (FOR BLOOD PRESSURE SUPPORT)
PREFILLED_SYRINGE | INTRAVENOUS | Status: AC
  Filled 2024-01-17: qty 20

## 2024-01-17 MED ORDER — SODIUM CHLORIDE 0.9% FLUSH
3.0000 mL | Freq: Two times a day (BID) | INTRAVENOUS | Status: DC
  Administered 2024-01-17 – 2024-01-24 (×14): 3 mL via INTRAVENOUS

## 2024-01-17 MED ORDER — EPHEDRINE SULFATE (PRESSORS) 50 MG/ML IJ SOLN
INTRAMUSCULAR | Status: DC | PRN
  Administered 2024-01-17: 5 mg via INTRAVENOUS
  Administered 2024-01-17 (×2): 10 mg via INTRAVENOUS
  Administered 2024-01-17 (×2): 5 mg via INTRAVENOUS

## 2024-01-17 MED ORDER — NEOMYCIN SULFATE 500 MG PO TABS: 1000.0000 mg | ORAL_TABLET | ORAL | Status: DC

## 2024-01-17 MED ORDER — INSULIN ASPART 100 UNIT/ML IJ SOLN
0.0000 [IU] | INTRAMUSCULAR | Status: DC
  Administered 2024-01-17 – 2024-01-18 (×3): 5 [IU] via SUBCUTANEOUS

## 2024-01-17 MED ORDER — INSULIN LISPRO 100 UNIT/ML IJ SOLN
0.0000 [IU] | Freq: Three times a day (TID) | INTRAMUSCULAR | Status: DC
Start: 2024-01-17 — End: 2024-01-17

## 2024-01-17 MED ORDER — ALVIMOPAN 12 MG PO CAPS
12.0000 mg | ORAL_CAPSULE | Freq: Two times a day (BID) | ORAL | Status: DC
  Administered 2024-01-18 – 2024-01-19 (×3): 12 mg via ORAL
  Filled 2024-01-17 (×4): qty 1

## 2024-01-17 MED ORDER — SODIUM CHLORIDE 0.9% FLUSH: 3.0000 mL | INTRAVENOUS | Status: DC | PRN

## 2024-01-17 MED ORDER — HYDROMORPHONE HCL 1 MG/ML IJ SOLN
0.2500 mg | INTRAMUSCULAR | Status: DC | PRN
  Administered 2024-01-17: 0.5 mg via INTRAVENOUS

## 2024-01-17 MED ORDER — CARVEDILOL 25 MG PO TABS
25.0000 mg | ORAL_TABLET | Freq: Two times a day (BID) | ORAL | Status: DC
  Administered 2024-01-17 – 2024-01-24 (×15): 25 mg via ORAL
  Filled 2024-01-17 (×15): qty 1

## 2024-01-17 MED ORDER — DROPERIDOL 2.5 MG/ML IJ SOLN: 0.6250 mg | Freq: Once | INTRAMUSCULAR | Status: DC | PRN

## 2024-01-17 MED ORDER — HYDROMORPHONE HCL 1 MG/ML IJ SOLN
INTRAMUSCULAR | Status: AC
  Filled 2024-01-17: qty 1

## 2024-01-17 MED ORDER — ONDANSETRON HCL 4 MG/2ML IJ SOLN
INTRAMUSCULAR | Status: DC | PRN
Start: 2024-01-17 — End: 2024-01-17
  Administered 2024-01-17: 4 mg via INTRAVENOUS

## 2024-01-17 MED ORDER — GABAPENTIN 100 MG PO CAPS
200.0000 mg | ORAL_CAPSULE | Freq: Every day | ORAL | Status: DC
  Administered 2024-01-17 – 2024-01-23 (×7): 200 mg via ORAL
  Filled 2024-01-17 (×7): qty 2

## 2024-01-17 MED ORDER — INDOCYANINE GREEN 25 MG IV SOLR
INTRAVENOUS | Status: DC | PRN
Start: 2024-01-17 — End: 2024-01-17
  Administered 2024-01-17: 7.5 mg via INTRAVENOUS

## 2024-01-17 MED ORDER — PROCHLORPERAZINE EDISYLATE 10 MG/2ML IJ SOLN: 5.0000 mg | Freq: Four times a day (QID) | INTRAMUSCULAR | Status: DC | PRN

## 2024-01-17 MED ORDER — SERTRALINE HCL 50 MG PO TABS
50.0000 mg | ORAL_TABLET | Freq: Every day | ORAL | Status: DC
  Administered 2024-01-18 – 2024-01-24 (×7): 50 mg via ORAL
  Filled 2024-01-17 (×7): qty 1

## 2024-01-17 MED ORDER — LACTATED RINGERS IV SOLN: INTRAVENOUS | Status: DC

## 2024-01-17 MED ORDER — CALCIUM POLYCARBOPHIL 625 MG PO TABS
625.0000 mg | ORAL_TABLET | Freq: Two times a day (BID) | ORAL | Status: DC
  Administered 2024-01-17 – 2024-01-24 (×14): 625 mg via ORAL
  Filled 2024-01-17 (×15): qty 1

## 2024-01-17 MED ORDER — ENSURE PRE-SURGERY PO LIQD: 296.0000 mL | Freq: Once | ORAL | Status: DC

## 2024-01-17 MED ORDER — DIPHENHYDRAMINE HCL 50 MG/ML IJ SOLN: 12.5000 mg | Freq: Four times a day (QID) | INTRAMUSCULAR | Status: DC | PRN

## 2024-01-17 MED ORDER — SODIUM CHLORIDE 0.9 % IV SOLN
2.0000 g | INTRAVENOUS | Status: AC
  Administered 2024-01-17: 2 g via INTRAVENOUS
  Filled 2024-01-17: qty 2

## 2024-01-17 MED ORDER — AMLODIPINE BESYLATE 5 MG PO TABS
5.0000 mg | ORAL_TABLET | Freq: Every day | ORAL | Status: DC
Start: 2024-01-18 — End: 2024-01-21
  Administered 2024-01-19 – 2024-01-21 (×2): 5 mg via ORAL
  Filled 2024-01-17 (×4): qty 1

## 2024-01-17 MED ORDER — ROCURONIUM BROMIDE 100 MG/10ML IV SOLN
INTRAVENOUS | Status: DC | PRN
  Administered 2024-01-17: 60 mg via INTRAVENOUS
  Administered 2024-01-17: 30 mg via INTRAVENOUS

## 2024-01-17 MED ORDER — SIMETHICONE 80 MG PO CHEW: 40.0000 mg | CHEWABLE_TABLET | Freq: Four times a day (QID) | ORAL | Status: DC | PRN

## 2024-01-17 MED ORDER — LIDOCAINE HCL (CARDIAC) PF 100 MG/5ML IV SOSY
PREFILLED_SYRINGE | INTRAVENOUS | Status: DC | PRN
  Administered 2024-01-17: 100 mg via INTRAVENOUS

## 2024-01-17 MED ORDER — SODIUM CHLORIDE 0.9 % IV SOLN: 250.0000 mL | INTRAVENOUS | Status: DC | PRN

## 2024-01-17 SURGICAL SUPPLY — 87 items
BAG COUNTER SPONGE SURGICOUNT (BAG) ×1 IMPLANT
BLADE EXTENDED COATED 6.5IN (ELECTRODE) IMPLANT
CANNULA REDUCER 12-8 DVNC XI (CANNULA) IMPLANT
CATH COUDE 5CC RIBBED (CATHETERS) IMPLANT
CELLS DAT CNTRL 66122 CELL SVR (MISCELLANEOUS) IMPLANT
CHLORAPREP W/TINT 26 (MISCELLANEOUS) IMPLANT
CLIP APPLIE 5 13 M/L LIGAMAX5 (MISCELLANEOUS) IMPLANT
CLIP APPLIE ROT 10 11.4 M/L (STAPLE) IMPLANT
COVER SURGICAL LIGHT HANDLE (MISCELLANEOUS) ×2 IMPLANT
COVER TIP SHEARS 8 DVNC (MISCELLANEOUS) ×1 IMPLANT
DEFOGGER SCOPE WARM SEASHARP (MISCELLANEOUS) ×1 IMPLANT
DEVICE TROCAR PUNCTURE CLOSURE (ENDOMECHANICALS) IMPLANT
DRAIN CHANNEL 19F RND (DRAIN) IMPLANT
DRAPE ARM DVNC X/XI (DISPOSABLE) ×4 IMPLANT
DRAPE COLUMN DVNC XI (DISPOSABLE) ×1 IMPLANT
DRAPE CV SPLIT W-CLR ANES SCRN (DRAPES) ×1 IMPLANT
DRAPE PERI GROIN 82X75IN TIB (DRAPES) ×1 IMPLANT
DRAPE SURG IRRIG POUCH 19X23 (DRAPES) ×1 IMPLANT
DRIVER NDL LRG 8 DVNC XI (INSTRUMENTS) ×1 IMPLANT
DRIVER NDLE LRG 8 DVNC XI (INSTRUMENTS) IMPLANT
DRSG OPSITE POSTOP 4X10 (GAUZE/BANDAGES/DRESSINGS) IMPLANT
DRSG OPSITE POSTOP 4X6 (GAUZE/BANDAGES/DRESSINGS) IMPLANT
DRSG OPSITE POSTOP 4X8 (GAUZE/BANDAGES/DRESSINGS) IMPLANT
DRSG TEGADERM 2-3/8X2-3/4 SM (GAUZE/BANDAGES/DRESSINGS) ×5 IMPLANT
DRSG TEGADERM 4X4.75 (GAUZE/BANDAGES/DRESSINGS) IMPLANT
ELECT PENCIL ROCKER SW 15FT (MISCELLANEOUS) ×1 IMPLANT
ELECT REM PT RETURN 15FT ADLT (MISCELLANEOUS) ×1 IMPLANT
ENDOLOOP SUT PDS II 0 18 (SUTURE) IMPLANT
EVACUATOR SILICONE 100CC (DRAIN) IMPLANT
GAUZE SPONGE 2X2 8PLY STRL LF (GAUZE/BANDAGES/DRESSINGS) ×1 IMPLANT
GLOVE ECLIPSE 8.0 STRL XLNG CF (GLOVE) ×3 IMPLANT
GLOVE INDICATOR 8.0 STRL GRN (GLOVE) ×3 IMPLANT
GOWN SRG XL LVL 4 BRTHBL STRL (GOWNS) ×1 IMPLANT
GOWN STRL REUS W/ TWL XL LVL3 (GOWN DISPOSABLE) ×4 IMPLANT
GRASPER SUT TROCAR 14GX15 (MISCELLANEOUS) IMPLANT
GRASPER TIP-UP FEN DVNC XI (INSTRUMENTS) ×1 IMPLANT
HOLDER FOLEY CATH W/STRAP (MISCELLANEOUS) ×1 IMPLANT
IRRIGATION SUCT STRKRFLW 2 WTP (MISCELLANEOUS) ×1 IMPLANT
KIT PROCEDURE DVNC SI (MISCELLANEOUS) IMPLANT
KIT SIGMOIDOSCOPE (SET/KITS/TRAYS/PACK) IMPLANT
KIT TURNOVER KIT A (KITS) ×1 IMPLANT
NDL INSUFFLATION 14GA 120MM (NEEDLE) ×1 IMPLANT
NDL SPNL 20GX3.5 QUINCKE YW (NEEDLE) ×1 IMPLANT
NEEDLE INSUFFLATION 14GA 120MM (NEEDLE) ×1 IMPLANT
NEEDLE SPNL 20GX3.5 QUINCKE YW (NEEDLE) ×1 IMPLANT
PACK COLON (CUSTOM PROCEDURE TRAY) ×1 IMPLANT
PAD POSITIONING PINK XL (MISCELLANEOUS) ×1 IMPLANT
PROTECTOR NERVE ULNAR (MISCELLANEOUS) ×2 IMPLANT
RELOAD STAPLE 45 3.5 BLU DVNC (STAPLE) IMPLANT
RELOAD STAPLE 45 4.3 GRN DVNC (STAPLE) IMPLANT
RELOAD STAPLE 60 2.5 WHT DVNC (STAPLE) IMPLANT
RELOAD STAPLE 60 3.5 BLU DVNC (STAPLE) IMPLANT
RELOAD STAPLE 60 4.3 GRN DVNC (STAPLE) IMPLANT
RETRACTOR WND ALEXIS 18 MED (MISCELLANEOUS) IMPLANT
SCISSORS LAP 5X35 DISP (ENDOMECHANICALS) ×1 IMPLANT
SCISSORS MNPLR CVD DVNC XI (INSTRUMENTS) ×1 IMPLANT
SEAL UNIV 5-12 XI (MISCELLANEOUS) ×4 IMPLANT
SEALER VESSEL EXT DVNC XI (MISCELLANEOUS) ×1 IMPLANT
SOLUTION ELECTROSURG ANTI STCK (MISCELLANEOUS) ×1 IMPLANT
SPIKE FLUID TRANSFER (MISCELLANEOUS) ×1 IMPLANT
STAPLER 45 SUREFORM DVNC (STAPLE) IMPLANT
STAPLER 60 SUREFORM DVNC (STAPLE) IMPLANT
STAPLER ECHELON POWER CIR 29 (STAPLE) IMPLANT
STAPLER ECHELON POWER CIR 31 (STAPLE) IMPLANT
STOPCOCK 4 WAY LG BORE MALE ST (IV SETS) ×2 IMPLANT
SURGILUBE 2OZ TUBE FLIPTOP (MISCELLANEOUS) IMPLANT
SUT MNCRL AB 4-0 PS2 18 (SUTURE) ×1 IMPLANT
SUT PDS AB 1 CT1 27 (SUTURE) ×2 IMPLANT
SUT PROLENE 0 CT 2 (SUTURE) IMPLANT
SUT PROLENE 2 0 KS (SUTURE) IMPLANT
SUT PROLENE 2 0 SH DA (SUTURE) IMPLANT
SUT SILK 2 0 SH CR/8 (SUTURE) IMPLANT
SUT SILK 3 0 SH CR/8 (SUTURE) IMPLANT
SUT VIC AB 2-0 SH 18 (SUTURE) ×1 IMPLANT
SUT VIC AB 3-0 SH 18 (SUTURE) IMPLANT
SUT VIC AB 3-0 SH 27XBRD (SUTURE) IMPLANT
SUT VICRYL 0 UR6 27IN ABS (SUTURE) IMPLANT
SUT VLOC 180 2-0 9IN GS21 (SUTURE) IMPLANT
SUTURE V-LC BRB 180 2/0GR6GS22 (SUTURE) IMPLANT
SYR 20ML ECCENTRIC (SYRINGE) ×1 IMPLANT
SYSTEM LAPSCP GELPORT 120MM (MISCELLANEOUS) IMPLANT
SYSTEM WOUND ALEXIS 18CM MED (MISCELLANEOUS) ×1 IMPLANT
TAPE UMBILICAL 1/8 X36 TWILL (MISCELLANEOUS) ×1 IMPLANT
TRAY FOLEY MTR SLVR 16FR STAT (SET/KITS/TRAYS/PACK) ×1 IMPLANT
TROCAR ADV FIXATION 5X100MM (TROCAR) ×1 IMPLANT
TUBING CONNECTING 10 (TUBING) ×2 IMPLANT
TUBING INSUFFLATION 10FT LAP (TUBING) ×1 IMPLANT

## 2024-01-17 NOTE — Assessment & Plan Note (Signed)
 Continue Zoloft 

## 2024-01-17 NOTE — Assessment & Plan Note (Addendum)
-   Outpatient regimen: On amlodipine , Coreg , telmisartan , torsemide , spironolactone  - await med rec to clarify bidil

## 2024-01-17 NOTE — Assessment & Plan Note (Addendum)
-   hx OM with R BKA 2022

## 2024-01-17 NOTE — Assessment & Plan Note (Signed)
-   Complicates overall prognosis and care - Body mass index is 25.8 kg/m.

## 2024-01-17 NOTE — H&P (Signed)
 01/17/2024     Cory [name] is a 78 y.o. male  who is seen today as an office consultation  at the request of DrRONITA Cao  for evaluation of descending colon cancer.   History of Present Illness:   78 year old male with numerous health issues.  He is originally from [place] in [place] but lived in [place] for many decades.  Relocated to [place]  I believe in 2018.  He lives by himself but has family nearby.  He lives in [place].  Gets a lot of primary care through [facility] but sometimes [facility] and sometimes [facility].  Had persistent intermittent rectal bleeding.  Underwent colonoscopy by Dr. Kristie on 3 [facility].  I cannot find the report but biopsy 36 cm from the anal verge noted adenocarcinoma.  Got a PET scan which puts this in the proximal descending colon just distal to the splenic flexure to my view.  No signs of metastatic disease elsewhere CEA 21.6.  Surgical consultation with Dr. Joetta through the [facility] health system/[facility] in early December.  Felt patient would need a colectomy.   Patient then was found unresponsive with seizures and COVID and was in the hospital for 2 weeks and left in mid December to SNF and rehab.:  Health system.  Our group was consulted but no need for emergency surgery and defer back to [facility] surgeon.   I think at that point the surgeon was hesitant to proceed at [facility] and recommended referral to something tertiary.  Looks like they decided to come back to [employer] region. He is getting some home health PT.  He has numerous family members including daughters and nieces around.  Many are nurses.  Saw Dr. Lanny with [facility] oncology in March after all this who recommended surgical consultation with our group.   Patient comes today with his niece, [name].  He is back at home living by himself but they keep tabs on him.  He notes he moves his bowels several times a day now.  He does  not smoke.  He is back to using a walker to help him with his right leg prosthesis.  His sugars are running in the 100s.  He is back on his Eliquis .  Some occasional rectal bleeding but nothing too severe.  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  He does wear a diaper for some occasional urinary incontinence but he admits it does not happen too often.   Uncontrolled diabetes with A1c as high as 14 a year ago but apparently is improved to now in the nines on insulin .  Last A1c in the nines.  History of right BKA.  Usually use a prosthesis and rolling walker.  History of renal insufficiency with baseline creatinine around 2.  The patient's niece recalls a referral from [facility] to see a nephrologist but I do not think that has happened yet strict chronic atrial fibrillation on Eliquis .  Echocardiogram a few months ago showed an EF of 60-65%.  I think his cardiologist is through [facility] but there looks like an appointment to see Dr. Edwyna through the [facility] system.  Had an appendectomy in [place] in 1969.  Prostate cancer treated with radiation only.  Hypertension, hyperlipidemia.     Medical History:       Past Medical History:  Diagnosis Date   Diabetes  mellitus without complication (CMS/HHS-HCC)     History of cancer           Patient Active Problem List  Diagnosis   Cancer of descending colon (CMS/HHS-HCC)   Acquired absence of right leg below knee (CMS/HHS-HCC)   Chronic anticoagulation   Hx of right BKA (CMS/HHS-HCC)   Hypertension   Radiation cystitis   Type 2 diabetes mellitus with chronic kidney disease, with long-term current use of insulin  (CMS/HHS-HCC)   Atrial flutter with rapid ventricular response (CMS/HHS-HCC)   Typical atrial flutter (CMS/HHS-HCC)   Decubitus ulcer of sacral region, stage 2 (CMS/HHS-HCC)   Iron  deficiency anemia due to chronic blood loss           Past  Surgical History:  Procedure Laterality Date   BELOW KNEE LEG AMPUTATION          No Known Allergies         Current Outpatient Medications on File Prior to Visit  Medication Sig Dispense Refill   carvediloL  (COREG ) 25 MG tablet Take 25 mg by mouth       LANTUS  SOLOSTAR U-100 INSULIN  pen injector (concentration 100 units/mL) 45 units by sub-q route.       potassium chloride  (KLOR-CON ) 10 MEQ ER tablet Take 20 mEq by mouth once daily       predniSONE  (DELTASONE ) 20 MG tablet 40 mg by oral route.       sertraline  (ZOLOFT ) 50 MG tablet Take 50 mg by mouth once daily       spironolactone  (ALDACTONE ) 100 MG tablet         tamsulosin  (FLOMAX ) 0.4 mg capsule          No current facility-administered medications on file prior to visit.           Family History  Problem Relation Age of Onset   High blood pressure (Hypertension) Mother     Diabetes Sister     Breast cancer Sister        Social History       Tobacco Use  Smoking Status Never  Smokeless Tobacco Never      Social History        Socioeconomic History   Marital status: Married  Tobacco Use   Smoking status: Never   Smokeless tobacco: Never  Substance and Sexual Activity   Alcohol use: Not Currently   Drug use: Never    Social Drivers of Health        Food Insecurity: Patient Unable To Answer (05/10/2023)    Received from [facility]    Hunger Vital Sign     Worried About Running Out of Food in the Last Year: Patient unable to answer     Ran Out of Food in the Last Year: Patient unable to answer  Transportation Needs: Patient Unable To Answer (05/10/2023)    Received from [facility]     Lack of Transportation (Medical): Patient unable to answer     Lack of Transportation (Non-Medical): Patient unable to answer  Social Connections: Patient Unable To Answer (05/10/2023)    Received from [facility]    Social Connection and Isolation Panel [NHANES]     Frequency of Communication  with Friends and Family: Patient unable to answer     Frequency of Social Gatherings with Friends and Family: Patient unable to answer     Attends Religious Services: Patient unable to answer     Active Member of Clubs  or Organizations: Patient unable to answer     Attends Club or Organization Meetings: Patient unable to answer     Marital Status: Patient unable to answer  Housing Stability: Unknown (08/14/2023)    Housing Stability Vital Sign     Homeless in the Last Year: No      ############################################################   Review of Systems: A complete review of systems (ROS) was obtained from the patient.   We have reviewed this information and discussed as appropriate with the patient.   See HPI as well for other pertinent ROS.   Constitutional:  No fevers, chills, sweats.  Weight stable Eyes:  No vision changes, No discharge HENT:  No sore throats, nasal drainage Lymph: No neck swelling, No bruising easily Pulmonary:  No cough, productive sputum CV: No orthopnea, PND . No exertional chest/neck/shoulder/arm pain.  Patient can walk 10 minutes gradually.     GI: Splenic flexure colon cancer as noted above.  Otherwise no personal nor family history of GI/colon cancer, inflammatory bowel disease, irritable bowel syndrome, allergy such as Celiac Sprue, dietary/dairy problems, colitis, ulcers nor gastritis.  No recent sick contacts/gastroenteritis.  No travel outside the country.  No changes in diet.   Renal: No UTIs, No hematuria Genital:  No drainage, bleeding, masses Musculoskeletal: No severe joint pain.  Walks with a below the knee prosthesis and uses walker.  Good ROM major joints Skin:  No sores or lesions Heme/Lymph:  No easy bleeding.  No swollen lymph nodes Neuro:  No active seizures.  No facial droop Psych:  No hallucinations.  No agitation   OBJECTIVE       Vitals:    08/14/23 1133  BP: 132/73  Pulse: 71  Temp: 36.1 C (97 F)  SpO2: 99%  Weight:  86.6 kg (191 lb)  Height: 180.3 cm (5' 11)    Body mass index is 26.64 kg/m.   PHYSICAL EXAM:     Constitutional: Not cachectic.  Hygeine adequate.  Vitals signs as above.   Eyes: Wears glasses - vision corrected,Pupils reactive, normal extraocular movements. Sclera nonicteric Neuro: CN II-XII intact.  No major focal sensory defects.  No major motor deficits. Lymph: No head/neck/groin lymphadenopathy Psych:  No severe agitation.  No severe anxiety.  Judgment & insight Adequate, sometimes some pausing and answers but good insight.  Oriented x4, HENT: Normocephalic, Mucus membranes moist.  No thrush.  Hearing: adequate Neck: Supple, No tracheal deviation.  No obvious thyromegaly Chest: No pain to chest wall compression.  Good respiratory excursion.  No audible wheezing CV:  Pulses intact.  irregular.  No major extremity edema Ext: No obvious deformity or contracture.  Edema: Not present.  No cyanosis Skin: No major subcutaneous nodules.  Warm and dry Musculoskeletal: Severe joint rigidity not present.  No obvious clubbing.  No digital petechiae.  Mobility: no assist device moving easily without restrictions   Abdomen:  Flat Soft.   Nondistended.  Nontender.    Hernia: Not present. Diastasis recti: Not present.  No hepatomegaly.  No splenomegaly.   Genital/Pelvic:  Inguinal hernia: Not present.  Inguinal lymph nodes: without lymphadenopathy nor hidradenitis.     Rectal: (Deferred)         ###################################################################   Labs, Imaging and Diagnostic Testing:   Located in 'Care Everywhere' section of Epic EMR chart     PRIOR [facility] CLINIC NOTES:   Located in 'Care Everywhere' section of Epic EMR chart     SURGERY NOTES:   Not applicable  PATHOLOGY:   Located in 'Care Everywhere' section of Epic EMR chart       Assessment and Plan:  DIAGNOSES:   Diagnoses and all orders for this visit:   Cancer of splenic flexure  (CMS/HHS-HCC)   Chronic anticoagulation   Type 2 diabetes mellitus with stage 3 chronic kidney disease, with long-term current use of insulin , unspecified whether stage 3a or 3b CKD (CMS/HHS-HCC)   Typical atrial flutter (CMS/HHS-HCC)   Primary hypertension   Radiation cystitis   Hx of right BKA (CMS/HHS-HCC)   Decubitus ulcer of sacral region, stage 2 (CMS/HHS-HCC)   Iron  deficiency anemia due to chronic blood loss   Other orders -     neomycin  500 mg tablet; Take 2 tablets (1,000 mg total) by mouth 3 (three) times daily for 3 doses SEE BOWEL PREP INSTRUCTIONS: Take 2 tablets at 2pm, 3pm, and 10pm the day prior to your colon operation. -     metroNIDAZOLE  (FLAGYL ) 500 MG tablet; Take 2 tablets (1,000 mg total) by mouth 3 (three) times daily for 3 doses SEE BOWEL PREP INSTRUCTIONS: Take 2 tablets at 2pm, 3pm, and 10pm the day prior to your colon operation. -     polyethylene glycol (MIRALAX ) powder; Take 233.75 g by mouth once for 1 dose Take according to your procedure prep instructions. -     bisacodyL  (DULCOLAX) 5 mg EC tablet; Take 4 tablets (20 mg total) by mouth once daily as needed for Constipation for up to 1 dose -     ondansetron  (ZOFRAN ) 4 MG tablet; Take 1 tablet (4 mg total) by mouth every 8 (eight) hours as needed for Nausea       ASSESSMENT/PLAN   78 year old male with numerous medical issues with a cancer in the proximal descending colon just distal to the splenic flexure.  No evidence of any metastatic disease  on PET scan noron recent repeat CAT scan.  Anemia due to bleeding.   Standard is colonic resection.  He is VERY overdue for this.  Delayed due to pulmonary complications in January, syncope/seizure-like episodes that he seems to have recovered from.  Will plan a robotic partial colon resection.  Initial read seems consistent near splenic flexure per to my view I suspect this is proximal sigmoid flipping up to the left upper quadrant.  We will see.  EEA versus  intracorporeal anastomosis.  Try and avoid a colostomy as long as he is not terrifically malnourished.  Will check nutrition labs.  I do not feel very strongly we need to do aggressive lymph node sampling in a 78 year old, just get rid of the bulky bleeding cancer before it worsens to perforation, obstruction, or hemorrhagic shock.  We will see.  The anatomy & physiology of the digestive tract was discussed.  The pathophysiology of the colon was discussed.  Natural history risks without surgery was discussed.   I feel the risks of no intervention will lead to serious problems that outweigh the operative risks; therefore, I recommended a partial colectomy to remove the pathology.  Minimally invasive (Robotic/Laparoscopic) & open techniques were discussed.    Risks such as bleeding, infection, abscess, leak, reoperation, injury to other organs, need for repair of tissues / organs, possible ostomy, hernia, heart attack, stroke, death, and other risks were discussed.  I noted a good likelihood this will help address the problem.   Goals of post-operative recovery were discussed as well.   Need for adequate nutrition, daily bowel regimen and healthy physical activity, to optimize  recovery was noted as well. We will work to minimize complications.  Educational materials were available as well.  Questions were answered.  The patient and his niece [name] expressed understanding & wish to proceed with surgery.   Patient has numerous health issues and has had some admissions for other reasons.  His diabetes is been a challenge to control but his hyperglycemia is not horrible.  Most likely will have hospitalist internal medicine to help follow the patient and manage with me   FOLLOWUP:  No follow-ups on file.   I have reviewed this patient's available data, including medical history, doctor notes, radiology & other studies, events of note, test results, physical exam, etc as part of my evaluation.   A significant  portion of that time was spent in counseling in discussion of management, need for further testing, need for other medical or surgical input, etc.   Care was provided by me.   Based on my assessment, this care required HIGH - Level 5 level of medical decision making.  08/14/2023     The plan was discussed in detail with the patient today, who expressed understanding & appreciation.  The patient has my contact information, and understands to call me with any additional questions or concerns.  I & my group would be happy to see the patient back sooner if the need arises.   Of note, portions of this report may have been transcribed using voice recognition software. Effort was made to ensure accuracy; however, inadvertent computerized transcription errors with sound-a-like substitutions may be present.   Any transcriptional errors that result from this process are unintentional.   ########################################################     Elspeth KYM Schultze, MD, FACS, MASCRS Esophageal, Gastrointestinal & Colorectal Surgery Robotic and Minimally Invasive Surgery   [facility]  [address] [place], [place] [address] [phone] Fax [phone] Main

## 2024-01-17 NOTE — Assessment & Plan Note (Signed)
 -  Continue Lipitor

## 2024-01-17 NOTE — Assessment & Plan Note (Addendum)
-   on eliquis  PTA; on hold for surgery; resumption per surgery; if prolonged off, can place on heparin  drip  - continue coreg   - PRN lopressor

## 2024-01-17 NOTE — Assessment & Plan Note (Addendum)
-   patient has history of CKD3b. Baseline creat ~ 2.2 - 2.5, eGFR~ 22-25 - stable at baseline  - BMP as needed

## 2024-01-17 NOTE — Op Note (Signed)
 01/17/2024  2:38 PM  PATIENT:  Cory [name]  78 y.o. male  Patient Care Team: [name], MD as PCP - General (Family Medicine) [name], MD as PCP - Cardiology (Cardiology) [name], MD as Referring Physician (Cardiology) [name], MD as Referring Physician (Geriatric Medicine) [name], [name], NP as Nurse Practitioner (Nurse Practitioner) [name], MD as Consulting Physician (Hematology and Oncology) [name], MD as Referring Physician (Surgical Oncology) [name], [name], MD as Consulting Physician (Cardiology) [name], MD (Family Medicine) [name], MD as Consulting Physician (General Surgery) [name], MD as Consulting Physician (Neurology) [name], [name], FNP as WOC Nurse (Nurse Practitioner) [name], [name], FNP as WOC Nurse (Nurse Practitioner)  PRE-OPERATIVE DIAGNOSIS: 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  This assistant was needed for exposure, dissection, suction, tissue approximation, retraction, perception, etc  ANESTHESIA:  General endotracheal intubation anesthesia (GETA) and Regional TRANSVERSUS ABDOMINIS PLANE (TAP) nerve block -BILATERAL for perioperative & postoperative pain control at the level of the transverse abdominis & preperitoneal spaces along the flank at the anterior axillary line, from subcostal ridge to iliac crest under laparoscopic guidance provided with 60 mL of bupivicaine 0.25% with epinephrine   Estimated Blood Loss (EBL):    Total I/O In: 350 [IV Piggyback:350] Out: - .   (See anesthesia record)  Delay start of Pharmacological VTE agent (>24hrs) due to concerns of significant anemia, surgical blood loss, or risk of bleeding?:  no  DRAINS: (None)  SPECIMEN:  Rectosigmoid (open end proximal) and Proximal anastomotic ring (FINAL PROXIMAL MARGIN)  DISPOSITION OF SPECIMEN:  Pathology  COUNTS:  Sponge, needle, & instrument counts CORRECT at the conclusion of the case.      PLAN OF CARE: Admit to inpatient   PATIENT DISPOSITION:  PACU - hemodynamically stable.  INDICATION:    78 year old male with numerous health issues who was found to be anemic with bleeding on anticoagulation for his atrial fibrillation.  Endoscopy done revealing a left-sided colon cancer.  Workup suspicious for descending colon.  Surgery was delayed given other health issues for several months but is more stabilized and cleared.  I recommended segmental resection:  The anatomy & physiology of the digestive tract was discussed.  The pathophysiology was discussed.  Natural history risks without surgery was discussed.   I worked to give an overview of the disease and the frequent need to have multispecialty involvement.  I feel the risks of no intervention will lead to serious problems that outweigh the operative risks; therefore, I recommended a partial colectomy to remove the pathology.  Laparoscopic & open techniques were discussed.   Risks such as bleeding, infection, abscess, leak, reoperation, possible ostomy, hernia, heart attack, death, and other risks were discussed.  I noted a good likelihood this will help address the problem.   Goals of post-operative recovery were discussed as well.  We will work to minimize complications.  Educational materials on the pathology had been given in the office.  Questions were answered.    The patient expressed understanding & wished to proceed with surgery.  OR  FINDINGS:   Patient had proximal sigmoid  colon cancer flopped up into the left upper quadrant.  No obvious metastatic disease on visceral parietal peritoneum or liver.  It is a 31mm EEA anastomosis ( mid descending colon connected to proximal rectum.)  It rests 14 cm from the anal verge by flexible sigmoidoscopy  CASE DATA: Type of patient?: Elective [facility] Private Case Status of Case? Elective Scheduled Infection Present At Time Of Surgery (PATOS)?  NO   DESCRIPTION:   Informed consent was confirmed.  The patient underwent general anaesthesia without difficulty.  The patient was positioned appropriately.  VTE prevention in place.  The patient was clipped, prepped, & draped in a sterile fashion.  Surgical timeout confirmed our plan.  The patient was positioned in reverse Trendelenburg.  Abdominal entry was gained using Varess technique at the left subcostal ridge on the anterior abdominal wall.  No elevated EtCO2 noted.  Port placed.  Camera inspection revealed no injury.  Extra ports were carefully placed under direct laparoscopic visualization.  Could see tattooing in the left side of the colon with a full partial obstructing colon mass.  It mobilized down into the pelvis consistent with a proximal sigmoid colon.  Not the descending colon.  This seemed to correlate with findings on CT scan review by myself and Dr. Teresa.  I reflected the greater omentum and the upper abdomen the small bowel in the upper abdomen.  The patient was carefully positioned.  The [employer] daVinci robot was docked with camera & instruments carefully placed.  I mobilized the rectosigmoid colon & elevated it to put the main pedicle on tension.  I scored the base of peritoneum of the medial side of the mesentery of the elevated left colon from the ligament of Treitz to the mid rectum.   I elevated the sigmoid mesentery and entered into the retro-mesenteric plane. We were able to identify the left ureter and gonadal vessels. We kept those posterior within the  retroperitoneum and elevated the left colon mesentery off that.  Patient's retroperitoneum was somewhat thickened and inflamed so ended up alternating between medial lateral and lateral medial approach.  Freed off peritoneal attachments to the proximal rectum and came cephalad towards the splenic flexure.  Elevated the sigmoid colon off the retroperitoneum to confirm the left ureter and gonadal's in the retroperitoneal natural position.  These were left undisturbed.  Carefully freed off the left colon off Gerota's fascia.  Left kidney injury Rhodus were quite inflamed and irritated.  Fibrotic.  I were able to eventually come up.  Then rotated back to free the midline retroperitoneal structures off the rectosigmoid sigmoid and descending colon.  I did isolate the inferior mesenteric artery (IMA) pedicle but did not ligate it yet.  I continued distally and got into the avascular plane posterior to the mesorectum, sparing the nervi ergentes.. This allowed me to help mobilize the rectum as well by freeing the mesorectum off the sacrum.  I stayed away from the right and left ureters.  I kept the lateral vascular pedicles to the rectum intact.  I skeletonized the lymph nodes off the inferior mesenteric pedicle.  Isolated the inferior mesenteric vein and ligated the IMV just cephalad to the ligament of Treitz for a good high ligation.  I went down to inferior mesenteric artery pedicle and skeletonized its takeoff from the aorta.  I isolated the inferior mesenteric vein off of the ligament of Treitz just cephalad to that as well.  The IMA pedicle was calcified  thick and fibrotic and would not clamp well.  We decided it would be safer to staple the IMA off with a white load stapler after confirming the left ureter was out of the way.  We continued medial to lateral dissection to free the left colon mesentery off the retroperitoneum going up towards the splenic flexure to allow good mobility and protect the colon mesentery.   I mobilized the left colon in a lateral to medial fashion off the retroperitoneum and sidewall attachments along the line of Toldt up towards the splenic flexure to ensure good mobilization of the remaining left colon to reach into the pelvis.   We then focused on mesorectal dissection.  Freed the mesorectum off the presacral plane until I was distal to the concerning region.  Freed off peritoneum on the lateral sidewalls as well and transected the mesentery of the lateral pedicles to get distal to the area of concern.  Came around anteriorly such that I had good circumferential mesorectal excision and a good margin distal to the area of concern.  I chose a region at the descending/sigmoid junction that was soft and easily reached down to the rectal stump.  I skeletonized the mesorectum at the proximal rectum.  The superior hemorrhoidal pedicle was quite thickened as well.  I was able to isolate and transect the superior hemorrhoidal vein well.  However the artery was very thickened.  Ended up transecting this with another white load stapler as well.  We then chose a region for the proximal margin that would reach well for our planned anastomosis (mid descending colon).  Transected the colon mesentery radially to preserve good collateral and marginal artery blood supply.    Given his calcific vessels with chronic kidney disease and fairly controlled diabetes, I wish to make sure there were no concerns of poor perfusion.  To assess vascular perfusion of tissues, we asked anesthesia use intravenous  indocyanine green  (ICG) with IV flush.  I switched to the NIR fluorescence (Firefly mode) imaging window on the daVinci robot platform.  We were able to see good light green visualization of blood vessels with good vascular perfusion of tissues, confirming good tissue perfusion of tissues  planned for anastomosis (mid descending colon and proximal rectum).  We then transected at the distal margin with a robotic  stapler.  Green load.  90% across with first firing and did another load at the inferolateral corner with a second load.  We created an extraction incision through a small Pfannenstiel incision in the suprapubic region.  Placed a wound protector.  I was able to eviscerate the rectosigmoid and descending colon out the wound.   I clamped the colon proximal to this area using a reusable pursestringer device.  Passed a 2-0 Keith needle. I transected at the descending/sigmoid junction with a scalpel. I got healthy bleeding mucosa.  We sent the rectosigmoid colon specimen off to go to pathology.  We sized the colon orifice.  I chose a 31mm EEA anvil stapler system.  I reinforced the prolene pursestring with interrupted silk belt loop sutures.  I placed the anvil to the open end of the proximal remaining colon and closed around it using the pursestring.    We did copious irrigation with crystalloid solution.  Hemostasis was good.  The distal end of the remaining colon easily reached down to the rectal stump, therefore, splenic flexure mobilization was  not needed.      Dr Teresa scrubbed down and did gentle anal dilation and  advanced the EEA stapler up the rectal stump. The spike was brought out at the provimal end of the rectal stump under direct visualization.  I attached the anvil of the proximal colon the spike of the stapler. Anvil was tightened down and held clamped for 60 seconds.  Orientation was confirmed such that there is no twisting of the colon nor small bowel underneath the mesenteric defect.  We did irrigation and inspection of the retroperitoneum and left colon pedicle to note good hemostasis.  No concerning tension.  The EEA stapler was fired and held clamped for 30 seconds. The stapler was released & removed. Blue stitch is in the proximal ring.  Care was taken to ensure no other structures were incorporated within this either.  We noted 2 excellent anastomotic rings.   The colon proximal to the  anastomosis was then gently occluded. The pelvis was filled with sterile irrigation.  Dr Teresa did flexible sigmoidoscopy.  Noted the anastomosis was at 14 cm from the anal verge consistent with the proximal rectum.  Intact anastomosis without active bleeding.  No mucosal ischemia.  There was a negative air leak test. There was no tension of mesentery or bowel at the anastomosis.   Tissues looked viable.  Ureters & bowel uninjured.  Irrigated another liter to confirm good hemostasis in the peritoneal cavity retroperitoneum and pelvis.  The anastomosis looked healthy.  Greater omentum positioned down into the pelvis to help protect the anastomosis.  Endoluminal gas was evacuated.  Ports & wound protector removed.  We changed gloves & redraped the patient per colon SSI prevention protocol.  We aspirated the sterile irrigation.  Hemostasis was good.  Sterile unused instruments were used from this point.  I closed the skin at the port sites using Monocryl stitch and sterile dressing.  We assured hemostasis and the former ostomy wound.  Wound irrigated.  I closed the posterior rectus fascia with 0 Vicryl suture.  Anterior rectus fascia was closed using #1 PDS transversely.  Sterile dressing placed.   Patient is being extubated go to recovery room. I had discussed postop care with the patient in detail the office & in the holding area. Instructions are written. I discussed operative findings, updated the patient's status, discussed probable steps to recovery, and gave postoperative recommendations to the patient's niece, [name] Naegeli.  Recommendations were made.  Questions were answered.  She expressed understanding & appreciation.  Elspeth KYM Schultze, M.D., F.A.C.S. Gastrointestinal and Minimally Invasive Surgery [facility] [address], [address] [place], [place] [address] [phone] Main / Paging

## 2024-01-17 NOTE — Assessment & Plan Note (Addendum)
-   last echo 09/19/23: EF 25 to 30%, global hypokinesis, mild LVH, indeterminate diastolic parameters -Currently no signs/symptoms exacerbation - fluid status to be monitored postop - Follows with cardiology, last seen 10/12/2023 - Maintained outpatient on Coreg , Norvasc , BiDil , telmisartan , torsemide  - no bidil  on med rec; will have pharmacy clarify  - Not a candidate for SGLT2 due to history of UTI per cardiology

## 2024-01-17 NOTE — Assessment & Plan Note (Addendum)
-   Last A1c 10.5% on 01/05/2024 - Lantus  dose increased on 01/21/2024 - continue Lantus  and SSI - Some elevation noted this morning, not sure if ate something out of ordinary as levels had been previously stable for the most part; giving extra NovoLog  to bring back down

## 2024-01-17 NOTE — Anesthesia Postprocedure Evaluation (Signed)
 Anesthesia Post Note  Patient: Laurier Champion  Procedure(s) Performed: COLECTOMY, PARTIAL, ROBOT-ASSISTED, LAPAROSCOPIC SIGMOIDOSCOPY, FLEXIBLE     Patient location during evaluation: PACU Anesthesia Type: General Level of consciousness: awake and alert Pain management: pain level controlled Vital Signs Assessment: post-procedure vital signs reviewed and stable Respiratory status: spontaneous breathing, nonlabored ventilation and respiratory function stable Cardiovascular status: blood pressure returned to baseline Postop Assessment: no apparent nausea or vomiting Anesthetic complications: no   No notable events documented.  Last Vitals:  Vitals:   01/17/24 1515 01/17/24 1530  BP: 136/83 132/88  Pulse: (!) 53 (!) 53  Resp: 14 15  Temp: (!) 35.7 C   SpO2: 100% 99%    Last Pain:  Vitals:   01/17/24 1530  TempSrc:   PainSc: Asleep                 Vertell Row

## 2024-01-17 NOTE — Anesthesia Procedure Notes (Signed)
 Procedure Name: Intubation Date/Time: 01/17/2024 12:58 PM  Performed by: Gladis Honey, CRNAPre-anesthesia Checklist: Patient identified, Emergency Drugs available, Suction available and Patient being monitored Patient Re-evaluated:Patient Re-evaluated prior to induction Oxygen Delivery Method: Circle System Utilized Preoxygenation: Pre-oxygenation with 100% oxygen Induction Type: IV induction Ventilation: Mask ventilation without difficulty Laryngoscope Size: Miller and 2 Grade View: Grade II Tube type: Oral Tube size: 7.5 mm Number of attempts: 1 Airway Equipment and Method: Stylet and Oral airway Placement Confirmation: ETT inserted through vocal cords under direct vision, positive ETCO2 and breath sounds checked- equal and bilateral Tube secured with: Tape Dental Injury: Teeth and Oropharynx as per pre-operative assessment  Comments: Grade 2b view

## 2024-01-17 NOTE — Consult Note (Addendum)
 Hospitalist Consult Note   Cory [name]  FMW:[id number]  DOB: [birth year]  DOA: 01/17/2024  PCP: [name], MD  Requesting provider: Dr. Sheldon Reason for consultation: medical management     History of Present Illness: Cory [name] is a 78 year old male with PMH A-flutter, DM II, recent incidental CVA June 2025, CKD 3B, HTN, HLD, IDA. He was diagnosed with colon cancer in December 2024 and before able to schedule surgery he has had recurrent illnesses, hospitalizations, and recent stroke in June.  He has been able to schedule surgery recently and underwent clearance with neurology prior to surgery. He underwent robotic assisted sigmoid colectomy with anastomosis on 01/17/2024. We are asked to help assist with medical management postop.  Interval History: Patient seen in the PACU still sedated and unable to be awoken.  Assessment and Plan: * Colon cancer [facility]) - seen by Dr. Lanny 07/19/23; initially diagnosed 04/19/23 and had undergone evaluation with [facility] surgery back then but had some hospitalizations preventing surgery then stroke in June but now has finally undergone surgical intervention - per PET in Dec 2024: Hypermetabolic proximal descending colon mass. No evidence of locoregional or distant metastatic disease.  -Underwent surgical resection on 01/17/2024 -Further postop management per general surgery, primary team  Atrial flutter (HCC) - on eliquis  PTA; on hold for surgery - resumption per general surgery; if prolonged off, can place on heparin  drip  - On Coreg  at home for rate control - PRN lopressor  IV added until taking PO again; depending on NPO status, we may also do scheduled lopressor  if necessary  HFrEF (heart failure with reduced ejection fraction) (HCC) - last echo 09/19/23: EF 25 to 30%, global hypokinesis, mild LVH, indeterminate diastolic parameters -Currently no signs/symptoms exacerbation - fluid status to be monitored postop - Follows  with cardiology, last seen 10/12/2023 - Maintained outpatient on Coreg , Norvasc , BiDil , telmisartan , torsemide  - no bidil  on med rec; will have pharmacy clarify  - Not a candidate for SGLT2 due to history of UTI per cardiology  CKD stage 3b, GFR 30-44 ml/min (HCC) - patient has history of CKD3b. 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 given surgery  Type 2 diabetes mellitus with chronic kidney disease, with long-term current use of insulin  (HCC) - Last A1c 10.5% on 01/05/2024 -Continue on SSI and will add back basal insulin  as necessary depending on diet advancement  Overweight (BMI 25.0-29.9) - Complicates overall prognosis and care - Body mass index is 25.8 kg/m.  Hypertension - On amlodipine , Coreg , telmisartan , torsemide , spironolactone  - await med rec to clarify bidil   Hyperlipidemia - On Lipitor at home   Review of Systems: Review of Systems  Unable to perform ROS: Other (still sedated from  anesthesia)    Past Medical History: Past Medical History:  Diagnosis Date   Arthritis    Atrial flutter (HCC)    BPH (benign prostatic hyperplasia)    CKD (chronic kidney disease), stage III (HCC)    Colon cancer (HCC)    Depression    Diabetes mellitus without complication (HCC)    DM (diabetes mellitus),  type 2 (HCC)    Dysrhythmia    HLD (hyperlipidemia)    HTN (hypertension)    Hx of right BKA (HCC)    Hyperlipidemia    Hypertension    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   Prostate CA (HCC)    Prostate CA (HCC)    Sepsis (HCC) 11/27/2020   Stroke [facility])     Past Surgical History: Past Surgical History:  Procedure Laterality Date   AMPUTATION Right 12/04/2020   Procedure: RIGHT BELOW KNEE AMPUTATION;  Surgeon: [name], MD;  Location[facility] OR;  Service: Orthopedics;  Laterality: Right;   APPENDECTOMY     APPLICATION OF WOUND VAC Right 12/04/2020   Procedure: APPLICATION OF WOUND VAC;  Surgeon: [name], MD;  Location: [facility] OR;  Service: Orthopedics;  Laterality: Right;   MINOR HEMORRHOIDECTOMY      Allergies:   Allergies  Allergen Reactions   Fish Allergy Other (See Comments)    No un scaled fish!!   Pork-Derived Products Other (See Comments)    Will not eat pork and religious reasons   Shellfish Allergy Other (See Comments)    Will not eat shellfish   Pumpkin Seed Oil Rash    Social History:  reports that he has quit smoking. His smoking use included cigarettes. He has never used smokeless tobacco. He reports that he does not drink alcohol and does not use drugs.  Family History: Family History  Problem Relation Age of Onset   Diabetes Mother    Hypertension Mother    Diabetes Father    Hypertension Father    Cancer Sister 30       breast cancer    Objective: Physical Exam: Vitals:   01/17/24 1515 01/17/24 1530 01/17/24 1545 01/17/24 1600  BP: 136/83 132/88 (!) 130/90 132/77  Pulse: (!) 53 (!) 53 (!) 46 (!) 46  Resp: 14 15 15 14   Temp: (!) 96.3 F (35.7 C)     TempSrc:      SpO2: 100% 99% 98% 98%  Weight:      Height:       Physical Exam Constitutional:      Comments: Laying in bed sedated and unable to be aroused  HENT:     Head: Normocephalic and atraumatic.     Mouth/Throat:     Mouth: Mucous membranes are dry.  Eyes:      Comments: Right pupil larger compared to left (2 mm versus 1 mm respectively)  Cardiovascular:     Rate and Rhythm: Normal rate. Rhythm irregular.  Pulmonary:     Effort: Pulmonary effort is normal.     Breath sounds: Normal breath sounds.  Abdominal:     General: There is no distension.     Palpations: Abdomen is soft.  Musculoskeletal:     Cervical back: Neck supple.     Comments: Right BKA noted with trace lower extremity edema  Skin:    General: Skin is warm and dry.  Neurological:     Comments: Unable to assess due to neurologic status from anesthesia     Data reviewed:  I  have personally reviewed following labs and imaging studies Results for orders placed or performed during the hospital encounter of 01/17/24 (from the past 48 hours)  Glucose, capillary     Status: None   Collection Time: 01/17/24 10:36 AM  Result Value Ref Range   Glucose-Capillary 99 70 - 99 mg/dL    Comment: Glucose reference range applies only to samples taken after fasting for at least 8 hours.  Glucose, capillary     Status: Abnormal   Collection Time: 01/17/24  2:49 PM  Result Value Ref Range   Glucose-Capillary 187 (H) 70 - 99 mg/dL    Comment: Glucose reference range applies only to samples taken after fasting for at least 8 hours.   Comment 1 Notify RN    Comment 2 Document in Chart     Labs:  CBC: No results for input(s): WBC, NEUTROABS, HGB, HCT, MCV, PLT in the last 168 hours.  Basic Metabolic Panel: No results for input(s): NA, K, CL, CO2, GLUCOSE, BUN, CREATININE, CALCIUM , MG, PHOS in the last 168 hours. GFR Estimated Creatinine Clearance: 26.5 mL/min (A) (by C-G formula based on SCr of 2.53 mg/dL (H)). Liver Function Tests: No results for input(s): AST, ALT, ALKPHOS, BILITOT, PROT, ALBUMIN  in the last 168 hours. No results for input(s): LIPASE, AMYLASE in the last 168 hours. No results for input(s): AMMONIA in the last 168  hours. Coagulation profile No results for input(s): INR, PROTIME in the last 168 hours.  Cardiac Enzymes: No results for input(s): CKTOTAL, CKMB, CKMBINDEX, TROPONINI in the last 168 hours. BNP: Invalid input(s): POCBNP CBG: Recent Labs  Lab 01/17/24 1036 01/17/24 1449  GLUCAP 99 187*   D-Dimer No results for input(s): DDIMER in the last 72 hours. Hgb A1c No results for input(s): HGBA1C in the last 72 hours. Lipid Profile No results for input(s): CHOL, HDL, LDLCALC, TRIG, CHOLHDL, LDLDIRECT in the last 72 hours. Thyroid function studies No results for input(s): TSH, T4TOTAL, T3FREE, THYROIDAB in the last 72 hours.  Invalid input(s): FREET3 Anemia work up No results for input(s): VITAMINB12, FOLATE, FERRITIN, TIBC, IRON , RETICCTPCT in the last 72 hours. Urinalysis    Component Value Date/Time   COLORURINE STRAW (A) 11/24/2023 0428   APPEARANCEUR CLEAR 11/24/2023 0428   LABSPEC 1.011 11/24/2023 0428   PHURINE 7.0 11/24/2023 0428   GLUCOSEU 50 (A) 11/24/2023 0428   HGBUR NEGATIVE 11/24/2023 0428   BILIRUBINUR NEGATIVE 11/24/2023 0428   KETONESUR NEGATIVE 11/24/2023 0428   PROTEINUR 100 (A) 11/24/2023 0428   NITRITE NEGATIVE 11/24/2023 0428   LEUKOCYTESUR TRACE (A) 11/24/2023 0428    Microbiology No results found for this or any previous visit (from the past 240 hours).  Inpatient Medications:   Scheduled Meds: PRN Meds: droperidol , HYDROmorphone  (DILAUDID ) injection, insulin  aspart, metoprolol  tartrate Continuous Infusions:  lactated ringers  10 mL/hr at 01/17/24 1030   norepinephrine  (LEVOPHED ) Adult infusion      Radiological Exams on Admission: No results found.  Thank you for this consultation. [facility] hospitalist team will follow the patient with you.   Time spent: Greater than 50% of the 60 minute visit was spent in counseling/coordination of care for the patient as laid out in the A&P.  Alm Apo  M.D. [facility] 01/17/2024, 4:21 PM Pager: Secure chat

## 2024-01-17 NOTE — Anesthesia Procedure Notes (Signed)
 Arterial Line Insertion Start/End9/02/2024 11:00 AM, 01/17/2024 11:10 AM Performed by: Therisa Doyal CROME, CRNA, CRNA  Patient location: Pre-op. Preanesthetic checklist: patient identified, IV checked, site marked, risks and benefits discussed, surgical consent, monitors and equipment checked, pre-op evaluation, timeout performed and anesthesia consent Lidocaine  1% used for infiltration radial was placed Catheter size: 20 G Hand hygiene performed  and maximum sterile barriers used   Attempts: 1 Procedure performed without using ultrasound guided technique. Following insertion, dressing applied and Biopatch. Post procedure assessment: normal and unchanged  Patient tolerated the procedure well with no immediate complications.

## 2024-01-17 NOTE — Discharge Instructions (Signed)
 SURGERY: POST OP INSTRUCTIONS (Surgery for small bowel obstruction, colon resection, etc)   ######################################################################  EAT Gradually transition to a high fiber diet with a fiber supplement over the next few days after discharge  WALK Walk an hour a day.  Control your pain to do that.    CONTROL PAIN Control pain so that you can walk, sleep, tolerate sneezing/coughing, go up/down stairs.  HAVE A BOWEL MOVEMENT DAILY Keep your bowels regular to avoid problems.  OK to try a laxative to override constipation.  OK to use an antidairrheal to slow down diarrhea.  Call if not better after 2 tries  CALL IF YOU HAVE PROBLEMS/CONCERNS Call if you are still struggling despite following these instructions. Call if you have concerns not answered by these instructions  ######################################################################   DIET Follow a light diet the first few days at home.  Start with a bland diet such as soups, liquids, starchy foods, low fat foods, etc.  If you feel full, bloated, or constipated, stay on a ful liquid or pureed/blenderized diet for a few days until you feel better and no longer constipated. Be sure to drink plenty of fluids every day to avoid getting dehydrated (feeling dizzy, not urinating, etc.). Gradually add a fiber supplement to your diet over the next week.  Gradually get back to a regular solid diet.  Avoid fast food or heavy meals the first week as you are more likely to get nauseated. It is expected for your digestive tract to need a few months to get back to normal.  It is common for your bowel movements and stools to be irregular.  You will have occasional bloating and cramping that should eventually fade away.  Until you are eating solid food normally, off all pain medications, and back to regular activities; your bowels will not be normal. Focus on eating a low-fat, high fiber diet the rest of your life  (See Getting to Good Bowel Health, below).  CARE of your INCISION or WOUND  It is good for closed incisions and even open wounds to be washed every day.  Shower every day.  Short baths are fine.  Wash the incisions and wounds clean with soap & water.    You may leave closed incisions open to air if it is dry.   You may cover the incision with clean gauze & replace it after your daily shower for comfort.  TEGADERM:  You have clear gauze band-aid dressings over your closed incision(s).  Remove the dressings 2 days after surgery = 9/12.    If you have an open wound with a wound vac, see wound vac care instructions.    ACTIVITIES as tolerated Start light daily activities --- self-care, walking, climbing stairs-- beginning the day after surgery.  Gradually increase activities as tolerated.  Control your pain to be active.  Stop when you are tired.  Ideally, walk several times a day, eventually an hour a day.   Most people are back to most day-to-day activities in a few weeks.  It takes 4-8 weeks to get back to unrestricted, intense activity. If you can walk 30 minutes without difficulty, it is safe to try more intense activity such as jogging, treadmill, bicycling, low-impact aerobics, swimming, etc. Save the most intensive and strenuous activity for last (Usually 4-8 weeks after surgery) such as sit-ups, heavy lifting, contact sports, etc.  Refrain from any intense heavy lifting or straining until you are off narcotics for pain control.  You will have off  days, but things should improve week-by-week. DO NOT PUSH THROUGH PAIN.  Let pain be your guide: If it hurts to do something, don't do it.  Pain is your body warning you to avoid that activity for another week until the pain goes down. You may drive when you are no longer taking narcotic prescription pain medication, you can comfortably wear a seatbelt, and you can safely make sudden turns/stops to protect yourself without hesitating due to  pain. You may have sexual intercourse when it is comfortable. If it hurts to do something, stop.   MEDICATIONS Take your usually prescribed home medications unless otherwise directed.    Blood thinners:  You can restart any strong blood thinners after the second postoperative day  for example: COUMADIN (warfarin), XERELTO (rivaroxaban), ELIQUIS  (apixaban ), PLAVIX (clopidigrel), BRILINTA (ticagrelor), EFFIENT (prasugrel), PRADAXA (dabigatran), etc  Continue aspirin before & after surgery..     Some oozing/bleeding the first 1-2 weeks is common but should taper down & be small volume.    If you are passing many large clots or having uncontrolling bleeding, call your surgeon    PAIN CONTROL Pain after surgery or related to activity is often due to strain/injury to muscle, tendon, nerves and/or incisions.  This pain is usually short-term and will improve in a few months.  To help speed the process of healing and to get back to regular activity more quickly, DO THE FOLLOWING THINGS TOGETHER: Increase activity gradually.  DO NOT PUSH THROUGH PAIN Use Ice and/or Heat Try Gentle Massage and/or Stretching Take over the counter pain medication Take Narcotic prescription pain medication for more severe pain  Good pain control = faster recovery.  It is better to take more medicine to be more active than to stay in bed all day to avoid medications.  Increase activity gradually Avoid heavy lifting at first, then increase to lifting as tolerated over the next 6 weeks. Do not "push through" the pain.  Listen to your body and avoid positions and maneuvers than reproduce the pain.  Wait a few days before trying something more intense Walking an hour a day is encouraged to help your body recover faster and more safely.  Start slowly and stop when getting sore.  If you can walk 30 minutes without stopping or pain, you can try more intense activity (running, jogging, aerobics, cycling, swimming,  treadmill, sex, sports, weightlifting, etc.) Remember: If it hurts to do it, then don't do it! Use Ice and/or Heat You will have swelling and bruising around the incisions.  This will take several weeks to resolve. Ice packs or heating pads (6-8 times a day, 30-60 minutes at a time) will help sooth soreness & bruising. Some people prefer to use ice alone, heat alone, or alternate between ice & heat.  Experiment and see what works best for you.  Consider trying ice for the first few days to help decrease swelling and bruising; then, switch to heat to help relax sore spots and speed recovery. Shower every day.  Short baths are fine.  It feels good!  Keep the incisions and wounds clean with soap & water.   Try Gentle Massage and/or Stretching Massage at the area of pain many times a day Stop if you feel pain - do not overdo it Take over the counter pain medication This helps the muscle and nerve tissues become less irritable and calm down faster Choose ONE of the following over-the-counter anti-inflammatory medications: Acetaminophen  500mg  tabs (Tylenol ) 1-2 pills with every meal  and just before bedtime (avoid if you have liver problems or if you have acetaminophen  in you narcotic prescription) Naproxen 220mg  tabs (ex. Aleve, Naprosyn) 1-2 pills twice a day (avoid if you have kidney, stomach, IBD, or bleeding problems) Ibuprofen 200mg  tabs (ex. Advil, Motrin) 3-4 pills with every meal and just before bedtime (avoid if you have kidney, stomach, IBD, or bleeding problems) Take with food/snack several times a day as directed for at least 2 weeks to help keep pain / soreness down & more manageable. Take Narcotic prescription pain medication for more severe pain A prescription for strong pain control is often given to you upon discharge (for example: oxycodone /Percocet, hydrocodone /Norco/Vicodin, or tramadol /Ultram ) Take your pain medication as prescribed. Be mindful that most narcotic prescriptions  contain Tylenol  (acetaminophen ) as well - avoid taking too much Tylenol . If you are having problems/concerns with the prescription medicine (does not control pain, nausea, vomiting, rash, itching, etc.), please call us  [phone] to see if we need to switch you to a different pain medicine that will work better for you and/or control your side effects better. If you need a refill on your pain medication, you must call the office before 4 pm and on weekdays only.  By federal law, prescriptions for narcotics cannot be called into a pharmacy.  They must be filled out on paper & picked up from our office by the patient or authorized caretaker.  Prescriptions cannot be filled after 4 pm nor on weekends.    WHEN TO CALL US  ([phone] Severe uncontrolled or worsening pain  Fever over 101 F (38.5 C) Concerns with the incision: Worsening pain, redness, rash/hives, swelling, bleeding, or drainage Reactions / problems with new medications (itching, rash, hives, nausea, etc.) Nausea and/or vomiting Difficulty urinating Difficulty breathing Worsening fatigue, dizziness, lightheadedness, blurred vision Other concerns If you are not getting better after two weeks or are noticing you are getting worse, contact our office ([phone] for further advice.  We may need to adjust your medications, re-evaluate you in the office, send you to the emergency room, or see what other things we can do to help. The clinic staff is available to answer your questions during regular business hours (8:30am-5pm).  Please don't hesitate to call and ask to speak to one of our nurses for clinical concerns.    A surgeon from [facility] is always on call at the hospitals 24 hours/day If you have a medical emergency, go to the nearest emergency room or call 911.  FOLLOW UP in our office One the day of your discharge from the hospital (or the next business weekday), please call [facility] to set up  or confirm an appointment to see your surgeon in the office for a follow-up appointment.  Usually it is 2-3 weeks after your surgery.   If you have skin staples at your incision(s), let the office know so we can set up a time in the office for the nurse to remove them (usually around 10 days after surgery). Make sure that you call for appointments the day of discharge (or the next business weekday) from the hospital to ensure a convenient appointment time. IF YOU HAVE DISABILITY OR FAMILY LEAVE FORMS, BRING THEM TO THE OFFICE FOR PROCESSING.  DO NOT GIVE THEM TO YOUR DOCTOR.  [facility] [address], [place], [place]  [address] ? [phone] - Main [phone] - Toll Free,  [phone] - Fax [email or web address]  GETTING TO GOOD BOWEL HEALTH. It is expected for your digestive tract to need a few months to get back to normal.  It is common for your bowel movements and stools to be irregular.  You will have occasional bloating and cramping that should eventually fade away.  Until you are eating solid food normally, off all pain medications, and back to regular activities; your bowels will not be normal.   Avoiding constipation The goal: ONE SOFT BOWEL MOVEMENT A DAY!    Drink plenty of fluids.  Choose water first. TAKE A FIBER SUPPLEMENT EVERY DAY THE REST OF YOUR LIFE During your first week back home, gradually add back a fiber supplement every day Experiment which form you can tolerate.   There are many forms such as powders, tablets, wafers, gummies, etc Psyllium bran (Metamucil), methylcellulose (Citrucel), Miralax  or Glycolax , Benefiber, Flax Seed.  Adjust the dose week-by-week (1/2 dose/day to 6 doses a day) until you are moving your bowels 1-2 times a day.  Cut back the dose or try a different fiber product if it is giving you problems such as diarrhea or bloating. Sometimes a laxative is needed to help jump-start bowels if  constipated until the fiber supplement can help regulate your bowels.  If you are tolerating eating & you are farting, it is okay to try a gentle laxative such as double dose MiraLax , prune juice, or Milk of Magnesia.  Avoid using laxatives too often. Stool softeners can sometimes help counteract the constipating effects of narcotic pain medicines.  It can also cause diarrhea, so avoid using for too long. If you are still constipated despite taking fiber daily, eating solids, and a few doses of laxatives, call our office. Controlling diarrhea Try drinking liquids and eating bland foods for a few days to avoid stressing your intestines further. Avoid dairy products (especially milk & ice cream) for a short time.  The intestines often can lose the ability to digest lactose when stressed. Avoid foods that cause gassiness or bloating.  Typical foods include beans and other legumes, cabbage, broccoli, and dairy foods.  Avoid greasy, spicy, fast foods.  Every person has some sensitivity to other foods, so listen to your body and avoid those foods that trigger problems for you. Probiotics (such as active yogurt, Align, etc) may help repopulate the intestines and colon with normal bacteria and calm down a sensitive digestive tract Adding a fiber supplement gradually can help thicken stools by absorbing excess fluid and retrain the intestines to act more normally.  Slowly increase the dose over a few weeks.  Too much fiber too soon can backfire and cause cramping & bloating. It is okay to try and slow down diarrhea with a few doses of antidiarrheal medicines.   Bismuth subsalicylate (ex. Kayopectate, Pepto Bismol) for a few doses can help control diarrhea.  Avoid if pregnant.   Loperamide  (Imodium ) can slow down diarrhea.  Start with one tablet (2mg ) first.  Avoid if you are having fevers or severe pain.  ILEOSTOMY PATIENTS WILL HAVE CHRONIC DIARRHEA since their colon is not in use.    Drink plenty of liquids.   You will need to drink even more glasses of water/liquid a day to avoid getting dehydrated. Record output from your ileostomy.  Expect to empty the bag every 3-4 hours at first.  Most people with a permanent ileostomy empty their bag 4-6 times at the least.   Use antidiarrheal medicine (especially Imodium ) several times a day to avoid getting dehydrated.  Start with a dose at bedtime & breakfast.  Adjust up or down as needed.  Increase antidiarrheal medications as directed to avoid emptying the bag more than 8 times a day (every 3 hours). Work with your wound ostomy nurse to learn care for your ostomy.  See ostomy care instructions. TROUBLESHOOTING IRREGULAR BOWELS 1) Start with a soft & bland diet. No spicy, greasy, or fried foods.  2) Avoid gluten/wheat or dairy products from diet to see if symptoms improve. 3) Miralax  17gm or flax seed mixed in 8oz. water or juice-daily. May use 2-4 times a day as needed. 4) Gas-X, Phazyme, etc. as needed for gas & bloating.  5) Prilosec (omeprazole) over-the-counter as needed 6)  Consider probiotics (Align, Activa, etc) to help calm the bowels down  Call your doctor if you are getting worse or not getting better.  Sometimes further testing (cultures, endoscopy, X-ray studies, CT scans, bloodwork, etc.) may be needed to help diagnose and treat the cause of the diarrhea. [facility] [address], [address], [place], [place]  [address] [phone] - Main.    [phone]  - Toll Free.   [phone] - Fax [email or web address]

## 2024-01-17 NOTE — Assessment & Plan Note (Addendum)
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

## 2024-01-17 NOTE — Assessment & Plan Note (Signed)
-   Baseline hemoglobin around 10 to 11 g/dL - Hgb stable; mild downtrend postop but no obvious signs of bleeding

## 2024-01-17 NOTE — Plan of Care (Signed)
  Problem: Education: Goal: Understanding of discharge needs will improve Outcome: Progressing Goal: Verbalization of understanding of the causes of altered bowel function will improve Outcome: Progressing   Problem: Activity: Goal: Ability to tolerate increased activity will improve Outcome: Progressing   Problem: Bowel/Gastric: Goal: Gastrointestinal status for postoperative course will improve Outcome: Progressing   Problem: Health Behavior/Discharge Planning: Goal: Identification of community resources to assist with postoperative recovery needs will improve Outcome: Progressing

## 2024-01-17 NOTE — Assessment & Plan Note (Signed)
-

## 2024-01-17 NOTE — Anesthesia Preprocedure Evaluation (Addendum)
 Anesthesia Evaluation  Patient identified by MRN, date of birth, ID band Patient awake    Reviewed: Allergy & Precautions, NPO status , Patient's Chart, lab work & pertinent test results, reviewed documented beta blocker date and time   History of Anesthesia Complications Negative for: history of anesthetic complications  Airway Mallampati: II  TM Distance: >3 FB Neck ROM: Full    Dental no notable dental hx.    Pulmonary former smoker   Pulmonary exam normal        Cardiovascular hypertension, Pt. on medications and Pt. 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), Poorly Controlled, Type 2, Insulin  Dependent    Renal/GU Renal InsufficiencyRenal disease     Musculoskeletal  (+) Arthritis ,    Abdominal   Peds  Hematology  (+) Blood dyscrasia, anemia   Anesthesia Other Findings   Reproductive/Obstetrics                              Anesthesia Physical Anesthesia Plan  ASA: 4  Anesthesia Plan: General   Post-op Pain Management: Tylenol  PO (pre-op)*   Induction: Intravenous  PONV Risk Score and Plan: 2 and Treatment may vary due to age or medical condition, Ondansetron  and Dexamethasone   Airway Management Planned: Oral ETT  Additional Equipment: Arterial line  Intra-op Plan:   Post-operative Plan: Extubation in OR  Informed Consent: I have reviewed the patients History and Physical, chart, labs and discussed the procedure including the risks, benefits and alternatives for the proposed anesthesia with the patient or authorized representative who has indicated his/her understanding and  acceptance.     Dental advisory given  Plan Discussed with: CRNA  Anesthesia Plan Comments:          Anesthesia Quick Evaluation

## 2024-01-17 NOTE — Transfer of Care (Signed)
 Immediate Anesthesia Transfer of Care Note  Patient: [facility]  Procedure(s) Performed: COLECTOMY, PARTIAL, ROBOT-ASSISTED, LAPAROSCOPIC SIGMOIDOSCOPY, FLEXIBLE  Patient Location: PACU  Anesthesia Type:General  Level of Consciousness: oriented and drowsy  Airway & Oxygen Therapy: Patient Spontanous Breathing and Patient connected to face mask oxygen  Post-op Assessment: Report given to RN and Post -op Vital signs reviewed and stable  Post vital signs: Reviewed and stable  Last Vitals:  Vitals Value Taken Time  BP 133/106 01/17/24 14:45  Temp    Pulse 74   Resp 15 01/17/24 14:58  SpO2 100 % 01/17/24 14:45  Vitals shown include unfiled device data.  Last Pain:  Vitals:   01/17/24 1445  TempSrc:   PainSc: 0-No pain         Complications: No notable events documented.

## 2024-01-17 NOTE — Consult Note (Signed)
 WOC Nurse requested for preoperative stoma site marking  Discussed surgical procedure and stoma creation with patient.  Explained role of the WOC nurse team.  Provided the patient with educational booklet and provided samples of pouching options.  Answered patient questions. He states he has no questions. He understands he is having a colectomy related to colon cancer.  He understands he may need an ostomy for elimination after surgery.   Examined patient lying, sitting, and standing in order to place the marking in the patient's visual field, away from any creases or abdominal contour issues and within the rectus muscle.   Marked for colostomy in the LUQ  4 cm to the left of the umbilicus and 1 cm below the umbilicus.  Marked for ileostomy in the RUQ  4 cm to the right of the umbilicus and  1 cm above the umbilicus.    Patient's abdomen cleansed with CHG wipes at site markings, allowed to air dry prior to marking.Covered mark with thin film transparent dressing to preserve mark until date of surgery.   WOC Nurse team will follow up with patient after surgery for continue ostomy care and teaching.   Darice Cooley MSN, RN, FNP-BC CWON Wound, Ostomy, Continence Nurse Outpatient [facility] [phone] Work cell phone:  [phone]

## 2024-01-17 NOTE — Assessment & Plan Note (Addendum)
-   seen by Dr. Lanny 07/19/23; initially diagnosed 04/19/23 and had undergone evaluation with [facility] surgery back then but had some hospitalizations preventing surgery then stroke in June but now has finally undergone surgical intervention - per PET in Dec 2024: Hypermetabolic proximal descending colon mass. No evidence of locoregional or distant metastatic disease.  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

## 2024-01-18 ENCOUNTER — Encounter (HOSPITAL_COMMUNITY): Payer: Self-pay | Admitting: Surgery

## 2024-01-18 DIAGNOSIS — I483 Typical atrial flutter: Secondary | ICD-10-CM | POA: Diagnosis not present

## 2024-01-18 DIAGNOSIS — E1122 Type 2 diabetes mellitus with diabetic chronic kidney disease: Secondary | ICD-10-CM | POA: Diagnosis not present

## 2024-01-18 DIAGNOSIS — Z794 Long term (current) use of insulin: Secondary | ICD-10-CM | POA: Diagnosis not present

## 2024-01-18 DIAGNOSIS — C185 Malignant neoplasm of splenic flexure: Secondary | ICD-10-CM | POA: Diagnosis not present

## 2024-01-18 LAB — BASIC METABOLIC PANEL WITH GFR
Anion gap: 13 (ref 5–15)
BUN: 24 mg/dL — ABNORMAL HIGH (ref 8–23)
CO2: 20 mmol/L — ABNORMAL LOW (ref 22–32)
Calcium: 9.1 mg/dL (ref 8.9–10.3)
Chloride: 97 mmol/L — ABNORMAL LOW (ref 98–111)
Creatinine, Ser: 1.84 mg/dL — ABNORMAL HIGH (ref 0.61–1.24)
GFR, Estimated: 38 mL/min — ABNORMAL LOW (ref >60)
Glucose, Bld: 256 mg/dL — ABNORMAL HIGH (ref 70–99)
Potassium: 4.6 mmol/L (ref 3.5–5.1)
Sodium: 131 mmol/L — ABNORMAL LOW (ref 135–145)

## 2024-01-18 LAB — CBC
HCT: 33.8 % — ABNORMAL LOW (ref 39.0–52.0)
Hemoglobin: 10.7 g/dL — ABNORMAL LOW (ref 13.0–17.0)
MCH: 26.6 pg (ref 26.0–34.0)
MCHC: 31.7 g/dL (ref 30.0–36.0)
MCV: 83.9 fL (ref 80.0–100.0)
Platelets: 192 K/uL (ref 150–400)
RBC: 4.03 MIL/uL — ABNORMAL LOW (ref 4.22–5.81)
RDW: 14.6 % (ref 11.5–15.5)
WBC: 7.3 K/uL (ref 4.0–10.5)
nRBC: 0 % (ref 0.0–0.2)

## 2024-01-18 LAB — MAGNESIUM: Magnesium: 2.1 mg/dL (ref 1.7–2.4)

## 2024-01-18 LAB — GLUCOSE, CAPILLARY
Glucose-Capillary: 251 mg/dL — ABNORMAL HIGH (ref 70–99)
Glucose-Capillary: 211 mg/dL — ABNORMAL HIGH (ref 70–99)
Glucose-Capillary: 169 mg/dL — ABNORMAL HIGH (ref 70–99)
Glucose-Capillary: 227 mg/dL — ABNORMAL HIGH (ref 70–99)
Glucose-Capillary: 355 mg/dL — ABNORMAL HIGH (ref 70–99)

## 2024-01-18 LAB — PREALBUMIN: Prealbumin: 19 mg/dL (ref 18–38)

## 2024-01-18 LAB — PHOSPHORUS: Phosphorus: 3.8 mg/dL (ref 2.5–4.6)

## 2024-01-18 MED ORDER — INSULIN GLARGINE 100 UNIT/ML ~~LOC~~ SOLN
15.0000 [IU] | Freq: Every day | SUBCUTANEOUS | Status: DC
  Administered 2024-01-19 – 2024-01-20 (×2): 15 [IU] via SUBCUTANEOUS
  Filled 2024-01-18 (×3): qty 0.15

## 2024-01-18 MED ORDER — INSULIN GLARGINE 100 UNIT/ML ~~LOC~~ SOLN
15.0000 [IU] | Freq: Two times a day (BID) | SUBCUTANEOUS | Status: DC
  Administered 2024-01-18: 15 [IU] via SUBCUTANEOUS
  Filled 2024-01-18 (×2): qty 0.15

## 2024-01-18 MED ORDER — INSULIN ASPART 100 UNIT/ML IJ SOLN
0.0000 [IU] | Freq: Three times a day (TID) | INTRAMUSCULAR | Status: DC
  Administered 2024-01-18: 15 [IU] via SUBCUTANEOUS
  Administered 2024-01-18 (×2): 5 [IU] via SUBCUTANEOUS
  Administered 2024-01-18: 3 [IU] via SUBCUTANEOUS
  Administered 2024-01-19: 11 [IU] via SUBCUTANEOUS
  Administered 2024-01-19 (×2): 8 [IU] via SUBCUTANEOUS
  Administered 2024-01-19 – 2024-01-20 (×2): 3 [IU] via SUBCUTANEOUS
  Administered 2024-01-20: 2 [IU] via SUBCUTANEOUS
  Administered 2024-01-20: 15 [IU] via SUBCUTANEOUS
  Administered 2024-01-20: 8 [IU] via SUBCUTANEOUS
  Administered 2024-01-21 (×2): 5 [IU] via SUBCUTANEOUS
  Administered 2024-01-21: 3 [IU] via SUBCUTANEOUS
  Administered 2024-01-22: 11 [IU] via SUBCUTANEOUS
  Administered 2024-01-22: 2 [IU] via SUBCUTANEOUS

## 2024-01-18 NOTE — Evaluation (Signed)
 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     Clinical Impressions PTA, patient lives at home alone at relatively mod I with use of SPC/RW or wheelchair with R BKA with prosthesis for amb, mod I for BADL's and light meal prep with hired PCA 1x per week for higher level housekeeping. Local niece assists and supports as needed.  Currently, patient presents with deficits outlined below (see OT Problem List for details) most significantly decreased overall activity tolerance and balance with need for LE reach assistance due to abdominal incision. Anticipate with pca and family support, patient will benefit from [facility] services upon discharge from Acute hospital setting. OT to instruct in AE use for LB ADL's but patient reports having all needed DME otherwise. Patient requires continued Acute care hospital level OT services to progress safety and functional performance and allow for discharge.       If plan is discharge home, recommend the following:   A little help with walking and/or transfers;A little help with bathing/dressing/bathroom;Assistance with cooking/housework;Assist for transportation;Help with stairs or ramp for entrance     Functional Status Assessment   Patient has had a recent decline in their functional status and demonstrates the ability to make significant improvements in function in a reasonable and predictable amount of time.     Equipment Recommendations   None recommended by OT      Precautions/Restrictions   Precautions Precautions: Fall;Other (comment) (abdominal incision, baseeline R BKA) Recall of Precautions/Restrictions: Intact Restrictions Weight Bearing  Restrictions Per Provider Order: No     Mobility Bed Mobility Overal bed mobility: Needs Assistance Bed Mobility: Supine to Sit     Supine to sit: Supervision, HOB elevated, Used rails     General bed mobility comments: min cues for hand placement    Transfers Overall transfer level: Needs assistance Equipment used: Rolling walker (2 wheels) Transfers: Sit to/from Stand, Bed to chair/wheelchair/BSC Sit to Stand: Contact guard assist, From elevated surface     Step pivot transfers: Min assist     General transfer comment: use of r prosthetic which patient managed with set up EOB, RW use with increased time for steadyingininitially and short distance amb with RW, min cues for hand placement, needed recliner follow due to sudden loose stool management      Balance Overall balance assessment: Needs assistance Sitting-balance support: Feet supported, No upper extremity supported Sitting balance-Leahy Scale: Good     Standing balance support: Bilateral upper extremity supported Standing balance-Leahy Scale: Fair Standing balance comment: with R prosthetic in place                           ADL either performed or assessed with clinical judgement   ADL Overall ADL's : Needs assistance/impaired Eating/Feeding: Set up;Sitting   Grooming: Wash/dry hands;Wash/dry face;Set up;Sitting   Upper Body Bathing: Set up;Sitting   Lower Body Bathing: Minimal assistance;Sit to/from stand   Upper Body Dressing : Set up;Sitting   Lower Body Dressing: Sit to/from stand;Moderate assistance Lower Body Dressing Details (indicate cue type and reason): including R LE prosthesis management Toilet Transfer: Contact guard assist;Rolling walker (2 wheels)   Toileting- Clothing Manipulation and Hygiene: Minimal assistance;Sit  to/from stand       Functional mobility during ADLs: Contact guard assist;Rolling walker (2 wheels) General ADL Comments: will need reacher trainng for LE  garment management to feet due to abdominal incision     Vision Baseline Vision/History: 0 No visual deficits;1 Wears glasses Ability to See in Adequate Light: 0 Adequate Patient Visual Report: No change from baseline              Pertinent Vitals/Pain Pain Assessment Pain Assessment: No/denies pain     Extremity/Trunk Assessment Upper Extremity Assessment Upper Extremity Assessment: Defer to OT evaluation   Lower Extremity Assessment Lower Extremity Assessment: Overall WFL for tasks assessed   Cervical / Trunk Assessment Cervical / Trunk Assessment: Normal   Communication Communication Communication: No apparent difficulties Factors Affecting Communication: Hearing impaired (mild)   Cognition Arousal: Alert Behavior During Therapy: WFL for tasks assessed/performed Cognition: No apparent impairments                               Following commands: Intact       Cueing  General Comments   Cueing Techniques: Verbal cues  no limitations by pain, needed recliner in hallway due to bloody, loose stool but managed with pads and application of mech underwear with pads           Home Living Family/patient expects to be discharged to:: Private residence Living Arrangements: Alone Available Help at Discharge: Personal care attendant;Family;Available PRN/intermittently Type of Home: Apartment Home Access: Level entry     Home Layout: One level     Bathroom Shower/Tub: Chief Strategy Officer: Standard Bathroom Accessibility: Yes How Accessible: Accessible via wheelchair Home Equipment: Rollator (4 wheels);Wheelchair - manual;Tub bench;BSC/3in1;Grab bars - tub/shower;Hand held shower head   Additional Comments: aide comes 1x/wk does laundry and cleans, niece drives to grocery store and dr's appointments      Prior Functioning/Environment Prior Level of Function : Independent/Modified Independent             Mobility Comments:  uses prosthetic most of time but occasionally mobilizes with w/c. Apartment is accessible. ADLs Comments: niece assists with finanaces and transportation and occasionally provides meals    OT Problem List: Decreased activity tolerance;Impaired balance (sitting and/or standing)   OT Treatment/Interventions: Self-care/ADL training;Therapeutic exercise;Energy conservation;DME and/or AE instruction;Therapeutic activities;Patient/family education;Balance training      OT Goals(Current goals can be found in the care plan section)   Acute Rehab OT Goals Patient Stated Goal: to go home OT Goal Formulation: With patient Time For Goal Achievement: 02/01/24 Potential to Achieve Goals: Good ADL Goals Pt Will Perform Lower Body Bathing: with set-up;sit to/from stand Pt Will Perform Lower Body Dressing: with supervision;with adaptive equipment;sit to/from stand Pt Will Transfer to Toilet: with supervision;ambulating;regular height toilet;grab bars Pt Will Perform Toileting - Clothing Manipulation and hygiene: with supervision;sit to/from stand   OT Frequency:  Min 2X/week    Co-evaluation PT/OT/SLP Co-Evaluation/Treatment: Yes Reason for Co-Treatment: To address functional/ADL transfers PT goals addressed during session: Mobility/safety with mobility OT goals addressed during session: ADL's and self-care      AM-PAC OT 6 Clicks Daily Activity     Outcome Measure Help from another person eating meals?: A Little Help from another person taking care of personal grooming?: A Little Help from another person toileting, which includes using toliet, bedpan, or urinal?: A Little Help from another person bathing (including washing, rinsing, drying)?: A Little Help  from another person to put on and taking off regular upper body clothing?: A Little Help from another person to put on and taking off regular lower body clothing?: A Little 6 Click Score: 18   End of Session Equipment Utilized During  Treatment: Gait belt;Rolling walker (2 wheels) Nurse Communication: Mobility status;Other (comment) (nusing present to assist with bowel cleanup in hallway and aware)  Activity Tolerance: Patient tolerated treatment well Patient left: in chair;with call bell/phone within reach;with chair alarm set  OT Visit Diagnosis: Unsteadiness on feet (R26.81)                Time: 8879-8854 OT Time Calculation (min): 25 min Charges:  OT General Charges $OT Visit: 1 Visit OT Evaluation $OT Eval Low Complexity: 1 Low  Conlan Miceli OT/L Acute Rehabilitation Department  [phone]  01/18/2024, 12:52 PM

## 2024-01-18 NOTE — Evaluation (Addendum)
 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  Clinical Impression  Pt admitted with above diagnosis.  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   Pt currently with functional limitations due to the deficits listed below (see PT Problem List). Pt will benefit from acute skilled PT to increase their independence and safety with mobility to allow discharge.           If plan is discharge home, recommend the following: Help with stairs or ramp for entrance;Assist for transportation   Can travel by private vehicle        Equipment Recommendations None recommended by PT  Recommendations for Other Services       Functional Status Assessment Patient has had a recent decline in their functional status and demonstrates the ability to make significant improvements in function in a reasonable and predictable amount of time.     Precautions / Restrictions Precautions Precautions: Fall Restrictions Weight Bearing Restrictions Per Provider Order: No      Mobility  Bed Mobility Overal bed mobility: Needs Assistance Bed Mobility: Supine to Sit     Supine to sit: Supervision     General bed mobility comments: for safety, set up for prosthesis, pt able to don sitting EOB    Transfers Overall transfer level: Needs assistance Equipment used: Rolling walker (2 wheels) Transfers: Sit to/from Stand Sit to Stand: Supervision, Contact guard assist           General transfer comment: STS from bed and chair. CGA-supervision for safety, pt uses proper hand placement    Ambulation/Gait Ambulation/Gait  assistance: Contact guard assist, Supervision Gait Distance (Feet): 25 Feet Assistive device: Rolling walker (2 wheels) Gait Pattern/deviations: Step-through pattern drop foot on L        General Gait Details: CGA for safety, no LOB; distance ltd by bowel incontinence/serousainguenous exudate d/t abd surgery  Stairs            Wheelchair Mobility     Tilt Bed    Modified Rankin (Stroke Patients Only)       Balance Overall balance assessment: Needs assistance Sitting-balance support: No upper extremity supported, Feet unsupported Sitting balance-Leahy Scale: Good     Standing balance support: No upper extremity supported, Reliant on assistive device for balance, During functional activity Standing balance-Leahy Scale: Fair                               Pertinent Vitals/Pain Pain Assessment Pain Assessment: No/denies pain    Home Living Family/patient expects to be discharged to:: Private residence Living Arrangements: Alone Available Help at Discharge: Personal care attendant;Family;Available PRN/intermittently Type of Home: Apartment Home Access: Level entry       Home Layout: One level Home Equipment: Rollator (4 wheels);Wheelchair - manual;Tub bench;BSC/3in1;Grab bars - tub/shower;Hand held shower head Additional Comments: aide comes 1x/wk does laundry and cleans, niece drives to grocery store and dr's appointments    Prior Function Prior Level of Function : Independent/Modified Independent             Mobility  Comments: uses prosthetic most of time but occasionally mobilizes with w/c. Apartment is accessible. ADLs Comments: niece assists with finanaces and transportation and occasionally provides meals     Extremity/Trunk Assessment   Upper Extremity Assessment Upper Extremity Assessment: Defer to OT evaluation    Lower Extremity Assessment Lower Extremity Assessment: Overall WFL for tasks assessed    Cervical / Trunk  Assessment Cervical / Trunk Assessment: Normal  Communication   Communication Communication: No apparent difficulties Factors Affecting Communication: Hearing impaired (mild)    Cognition Arousal: Alert Behavior During Therapy: WFL for tasks assessed/performed   PT - Cognitive impairments: No apparent impairments                         Following commands: Intact       Cueing Cueing Techniques: Verbal cues     General Comments General comments (skin integrity, edema, etc.): no limitations by pain, needed recliner in hallway due to bloody, loose stool but managed with pads and application of mech underwear with pads    Exercises     Assessment/Plan    PT Assessment Patient needs continued PT services  PT Problem List Decreased strength;Decreased range of motion;Decreased activity tolerance;Decreased mobility;Decreased balance;Decreased knowledge of use of DME       PT Treatment Interventions DME instruction;Functional mobility training;Therapeutic activities;Therapeutic exercise;Gait training;Patient/family education;Balance training    PT Goals (Current goals can be found in the Care Plan section)  Acute Rehab PT Goals PT Goal Formulation: With patient Time For Goal Achievement: 02/01/24 Potential to Achieve Goals: Good    Frequency Min 3X/week     Co-evaluation PT/OT/SLP Co-Evaluation/Treatment: Yes Reason for Co-Treatment: To address functional/ADL transfers PT goals addressed during session: Mobility/safety with mobility OT goals addressed during session: ADL's and self-care       AM-PAC PT 6 Clicks Mobility  Outcome Measure Help needed turning from your back to your side while in a flat bed without using bedrails?: A Little Help needed moving from lying on your back to sitting on the side of a flat bed without using bedrails?: A Little Help needed moving to and from a bed to a chair (including a wheelchair)?: A Little Help needed standing up  from a chair using your arms (e.g., wheelchair or bedside chair)?: A Little Help needed to walk in hospital room?: A Little Help needed climbing 3-5 steps with a railing? : A Little 6 Click Score: 18    End of Session Equipment Utilized During Treatment: Gait belt Activity Tolerance: Patient tolerated treatment well;Treatment limited secondary to medical complications (Comment) Patient left: with call bell/phone within reach;in chair;with chair alarm set Nurse Communication: Mobility status PT Visit Diagnosis: Other abnormalities of gait and mobility (R26.89)    Time: 8880-8856 PT Time Calculation (min) (ACUTE ONLY): 24 min   Charges:   PT Evaluation $PT Eval Low Complexity: 1 Low   PT General Charges $$ ACUTE PT VISIT: 1 Visit         Bertram Haddix, PT  Acute Rehab Dept [facility]) [phone]  01/18/2024   [facility] 01/18/2024, 12:54 PM

## 2024-01-18 NOTE — Progress Notes (Signed)
 01/18/2024  Cory [name] [id number] October 20, 1945  CARE TEAM: PCP: [name], MD  Outpatient Care Team: Patient Care Team: [name], MD as PCP - General (Family Medicine) [name], MD as PCP - Cardiology (Cardiology) [name], MD as Referring Physician (Cardiology) [name], MD as Referring Physician (Geriatric Medicine) [name], [name], NP as Nurse Practitioner (Nurse Practitioner) [name], MD as Consulting Physician (Hematology and Oncology) [name], MD as Referring Physician (Surgical Oncology) [name], [name], MD as Consulting Physician (Cardiology) [name], MD (Family Medicine) [name], MD as Consulting Physician (General Surgery) [name], MD as Consulting Physician (Neurology) [name], [name], FNP as WOC Nurse (Nurse Practitioner) [name], [name], FNP as WOC Nurse (Nurse Practitioner)  Inpatient Treatment Team: Treatment Team:  [name], MD Patsy Lenis, MD [name], [name], FNP [name], RN [name], RN [name], NT [name], OT [name], MD [name], PT [name], RN Purgason, [name], RRT [name], [place] [name], RN   Problem List:   Principal Problem:   Colon cancer [facility]) Active Problems:   Hyperlipidemia   Hypertension   Overweight (BMI 25.0-29.9)   Type 2 diabetes mellitus with chronic kidney disease, with long-term current use of insulin  (HCC)   Atrial flutter (HCC)   CKD stage 3b, GFR 30-44 ml/min (HCC)   IDA (iron  deficiency anemia)   Hx of right BKA (HCC)   HFrEF (heart failure with reduced ejection fraction) (HCC)   Depression   GERD (gastroesophageal reflux disease)   History of CVA (cerebrovascular accident)   01/17/2024  POST-OPERATIVE DIAGNOSIS:  PROXIMAL SIGMOID COLON CANCER   PROCEDURE:   -ROBOTIC SIGMOID COLECTOMY WITH ANASTOMOSIS -INTRAOPERATIVE ASSESSMENT OF TISSUE  VASCULAR PERFUSION USING ICG (indocyanine green ) IMMUNOFLUORESCENCE -TRANSVERSUS ABDOMINIS PLANE (TAP) BLOCK - BILATERAL -FLEXIBLE SIGMOIDOSCOPY   SURGEON:  [name], MD  OR FINDINGS:   Patient had proximal sigmoid colon cancer flopped up into the left upper quadrant.  No obvious metastatic disease on visceral parietal peritoneum or liver.   It is a 31mm EEA anastomosis ( mid descending colon connected to proximal rectum.)  It rests 14 cm from the anal verge by flexible sigmoidoscopy  Assessment [facility] Stay = 1 days) 1 Day Post-Op    Relatively stable    Plan:  ERAS protocol  Follow-up in pathology.  No evidence of metastatic disease   Tolerating liquids.  Advance to soft diet per protocol.  If tolerates then heart diet.  Ideally on 3 E. colorectal surgery floor   poorly controlled diabetes with significant hyperglycemia.  I had on scheduled Lantus .  Will restart, continue SSI & see what medicine thanks.  Stop D5 IV fluids per protocol on postop day 1.  See if diabetic coordinator can help with training since it has been a challenge to help that  Follow hemoglobin closely.  If stable for 48 hours, okay to resume Eliquis  anticoagulation.  That would be postop day 2 = 9/12 = Friday = tomorrow.  -monitor electrolytes & replace as needed  Keep K>4, Mg>2, Phos>3  HTN control  -VTE prophylaxis- SCDs.  Anticoagulation prophyllaxis SQ as appropriate  -mobilize as tolerated to help recovery.  He has a BKA but ambulates well with his prosthesis.  I wrote for therapies to help evaluate him given his numerous hospitalizations and somewhat deconditioned state to see if he needs home health or even skilled nursing  facility.  Enlist therapies in moderate/high risk patients as appropriate  I updated the patient's status to the patient  Recommendations were made.  Questions were answered.  He expressed understanding & appreciation.  -Disposition:  Disposition:  The patient is  from: Home Anticipate discharge to:  Home with Home Health Anticipated Date of Discharge is:  September 12,2025   Barriers to discharge:  Pending Clinical improvement (more likely than not), Therapy assessment & Recommendations pending, Testing result pending, and Consultant clearance & sign off    Patient currently is NOT MEDICALLY STABLE for discharge from the hospital from a surgery standpoint.      I reviewed nursing notes, hospitalist notes, last 24 h vitals and pain scores, last 48 h intake and output, last 24 h labs and trends, and last 24 h imaging results.  I have reviewed this patient's available data, including medical history, events of note, test results, etc as part of my evaluation.   A significant portion of that time was spent in counseling. Care during the described time interval was provided by me.  This care required moderate level of medical decision making.  01/18/2024    Subjective: (Chief complaint)  No major events.    Tolerating liquids.  Denies much pain.  Objective:  Vital signs:  Vitals:   01/17/24 2230 01/18/24 0231 01/18/24 0449 01/18/24 0550  BP: 132/78 (!) 140/89  94/63  Pulse: 63 98  88  Resp: 18 18  18   Temp: 98 F (36.7 C) 98.3 F (36.8 C)  97.9 F (36.6 C)  TempSrc:  Oral  Oral  SpO2: 98% 99%  100%  Weight:   85.9 kg   Height:        Last BM Date : 01/17/24  Intake/Output   Yesterday:  09/10 0701 - 09/11 0700 In: 2514.7 [P.O.:480; I.V.:1584.7; IV Piggyback:450] Out: 8474 [Lmpwz:8524; Blood:50] This shift:  No intake/output data recorded.  Bowel function:  Flatus: No  BM:  No  Drain: (No drain)   Physical Exam:  General: Pt awake/alert in no acute distress Eyes: PERRL, normal EOM.  Sclera clear.  No icterus Neuro: CN II-XII intact w/o focal sensory/motor deficits. Lymph: No head/neck/groin lymphadenopathy Psych:  No delerium/psychosis/paranoia.  Oriented x 4 HENT: Normocephalic, Mucus membranes moist.  No  thrush Neck: Supple, No tracheal deviation.  No obvious thyromegaly Chest: No pain to chest wall compression.  Good respiratory excursion.  No audible wheezing CV:  Pulses intact.  Regular rhythm.  No major extremity edema MS: Normal AROM mjr joints.  No obvious deformity  Abdomen: Soft.  Mildy distended.  Mildly tender at incisions only.  No evidence of peritonitis.  No incarcerated hernias.  Ext:  No deformity.  No mjr edema.  No cyanosis Skin: No petechiae / purpurea.  No major sores.  Warm and dry    Results:   Cultures: No results found for this or any previous visit (from the past 720 hours).  Labs: Results for orders placed or performed during the hospital encounter of 01/17/24 (from the past 48 hours)  Glucose, capillary     Status: None   Collection Time: 01/17/24 10:36 AM  Result Value Ref Range   Glucose-Capillary 99 70 - 99 mg/dL    Comment: Glucose reference range applies only to samples taken after fasting for at least 8 hours.  Glucose, capillary     Status: Abnormal   Collection Time: 01/17/24  2:49 PM  Result Value Ref Range   Glucose-Capillary 187 (H) 70 -  99 mg/dL    Comment: Glucose reference range applies only to samples taken after fasting for at least 8 hours.   Comment 1 Notify RN    Comment 2 Document in Chart   Glucose, capillary     Status: Abnormal   Collection Time: 01/17/24  5:39 PM  Result Value Ref Range   Glucose-Capillary 250 (H) 70 - 99 mg/dL    Comment: Glucose reference range applies only to samples taken after fasting for at least 8 hours.  Glucose, capillary     Status: Abnormal   Collection Time: 01/17/24  8:58 PM  Result Value Ref Range   Glucose-Capillary 256 (H) 70 - 99 mg/dL    Comment: Glucose reference range applies only to samples taken after fasting for at least 8 hours.  Glucose, capillary     Status: Abnormal   Collection Time: 01/17/24 10:27 PM  Result Value Ref Range   Glucose-Capillary 278 (H) 70 - 99 mg/dL    Comment:  Glucose reference range applies only to samples taken after fasting for at least 8 hours.  Glucose, capillary     Status: Abnormal   Collection Time: 01/17/24 11:57 PM  Result Value Ref Range   Glucose-Capillary 258 (H) 70 - 99 mg/dL    Comment: Glucose reference range applies only to samples taken after fasting for at least 8 hours.  Basic metabolic panel     Status: Abnormal   Collection Time: 01/18/24  3:32 AM  Result Value Ref Range   Sodium 131 (L) 135 - 145 mmol/L   Potassium 4.6 3.5 - 5.1 mmol/L   Chloride 97 (L) 98 - 111 mmol/L   CO2 20 (L) 22 - 32 mmol/L   Glucose, Bld 256 (H) 70 - 99 mg/dL    Comment: Glucose reference range applies only to samples taken after fasting for at least 8 hours.   BUN 24 (H) 8 - 23 mg/dL   Creatinine, Ser 8.15 (H) 0.61 - 1.24 mg/dL   Calcium  9.1 8.9 - 10.3 mg/dL   GFR, Estimated 38 (L) >60 mL/min    Comment: (NOTE) Calculated using the CKD-EPI Creatinine Equation (2021)    Anion gap 13 5 - 15    Comment: Performed at [facility], [address] [place], [place] [address]  CBC     Status: Abnormal   Collection Time: 01/18/24  3:32 AM  Result Value Ref Range   WBC 7.3 4.0 - 10.5 K/uL   RBC 4.03 (L) 4.22 - 5.81 MIL/uL   Hemoglobin 10.7 (L) 13.0 - 17.0 g/dL   HCT 66.1 (L) 60.9 - 47.9 %   MCV 83.9 80.0 - 100.0 fL   MCH 26.6 26.0 - 34.0 pg   MCHC 31.7 30.0 - 36.0 g/dL   RDW 85.3 88.4 - 84.4 %   Platelets 192 150 - 400 K/uL   nRBC 0.0 0.0 - 0.2 %    Comment: Performed at [facility], [address], [place], [place] [address]  Magnesium      Status: None   Collection Time: 01/18/24  3:32 AM  Result Value Ref Range   Magnesium  2.1 1.7 - 2.4 mg/dL    Comment: Performed at [facility], [address], [place], [place] [address]  Prealbumin     Status: None   Collection Time: 01/18/24  3:32 AM  Result Value Ref Range   Prealbumin 19 18 - 38 mg/dL    Comment: Performed at [facility], [address], [place], [place] [address]  Phosphorus     Status: None   Collection Time: 01/18/24  3:32 AM  Result Value Ref Range   Phosphorus 3.8 2.5 - 4.6 mg/dL    Comment: Performed at [facility], [address] [place], [place] [address]  Glucose, capillary     Status: Abnormal   Collection Time: 01/18/24  3:56 AM  Result Value Ref Range   Glucose-Capillary 251 (H) 70 - 99 mg/dL    Comment: Glucose reference range applies only to samples taken after fasting for at least 8 hours.    Imaging / Studies: No results found.  Medications / Allergies: per chart  Antibiotics: Anti-infectives (From admission, onward)    Start     Dose/Rate Route Frequency Ordered Stop   01/17/24 2200  cefoTEtan  (CEFOTAN ) 2 g in sodium chloride  0.9 % 100 mL IVPB        2 g 200 mL/hr over 30 Minutes Intravenous Every 12 hours 01/17/24 1725 01/17/24 2157   01/17/24 1400  neomycin  (MYCIFRADIN ) tablet 1,000 mg  Status:  Discontinued       Placed in And Linked Group   1,000 mg Oral 3 times per day 01/17/24 1005 01/17/24 1013   01/17/24 1400  metroNIDAZOLE  (FLAGYL ) tablet 1,000 mg  Status:  Discontinued       Placed in And Linked Group   1,000 mg Oral 3 times per day 01/17/24 1005 01/17/24 1013   01/17/24 1015  cefoTEtan  (CEFOTAN ) 2 g in sodium chloride  0.9 % 100 mL IVPB        2 g 200 mL/hr over 30 Minutes Intravenous On call to O.R. 01/17/24 1005 01/17/24 1730         Note: Portions of this report may have been transcribed using voice recognition software. Every effort was made to ensure accuracy; however, inadvertent computerized transcription errors may be present.   Any transcriptional errors that result from this process are unintentional.    Elspeth KYM Schultze, MD, FACS, MASCRS Esophageal, Gastrointestinal & Colorectal Surgery Robotic and Minimally Invasive Surgery  [facility] [address], [address] [place], [place] [address] [phone] Fax [phone] Main  CONTACT INFORMATION: Weekday (9AM-5PM): Call [facility] main office at [phone] Weeknight (5PM-9AM) or Weekend/Holiday: Check EPIC Web Links tab & use AMION (password  [email or web address]) for General Surgery [facility] coverage  Please, DO NOT use SecureChat  (it is not reliable communication to reach operating surgeons & will lead to a delay in care).   Epic staff messaging available for outptient concerns needing 1-2 business day response.      01/18/2024  7:39 AM

## 2024-01-18 NOTE — TOC Initial Note (Addendum)
 Transition of Care [facility]) - Initial/Assessment Note    Patient Details  Name: Cory [name] MRN: [id number] Date of Birth: 1945-06-20  Transition of Care [facility]) CM/SW Contact:    Alfonse JONELLE Rex, RN Phone Number: 01/18/2024, 2:01 PM  Clinical Narrative:  Call to patient's son, Cory [name], introduced TOC/NCM and review for dc planning, PT recommendation for [facility] PT/OT, Alwin agreeable, no preference. Cory [name] confirmed patient lives alone, has some family that lives locally and assist patient as needed, reports patient's is independent with functional mobility at baseline. Cory [name] requested NCM call family member, Cory [name], who lives locally, to update on dc planning.  NCM called to Cory [name], introduced self and role of TOC/NCM, Cory [name] reports she and her sister live locally but are unable to provide care 24/7 after discharge, requesting short term rehab. Teams chat sent to attending and PT informing of request for ST-SNF.   - 4:18pm Teams chat received from PT/OT, after eval, no recommendation for SNF. NCM call to patient's niece, Cory [name], no answer, vm left information no SNF recommendation from PT, [facility] PT/OT arranged with [facility].                       Patient Goals and CMS Choice            Expected Discharge Plan and Services                                              Prior Living Arrangements/Services                       Activities of Daily Living   ADL Screening (condition at time of admission) Independently performs ADLs?: Yes (appropriate for developmental age) Is the patient deaf or have difficulty hearing?: No Does the patient have difficulty seeing, even when wearing glasses/contacts?: No Does the patient have difficulty concentrating, remembering, or making decisions?: No  Permission Sought/Granted                  Emotional Assessment              Admission diagnosis:  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] Colon cancer (HCC)  [C18.9] Patient Active Problem List   Diagnosis Date Noted   Depression 01/17/2024   GERD (gastroesophageal reflux disease) 01/17/2024   History of CVA (cerebrovascular accident) 01/17/2024   Colon cancer (HCC) 01/17/2024   Stroke (HCC) 11/21/2023   Syncope and collapse 10/19/2023   HFrEF (heart failure with reduced ejection fraction) (HCC) 09/25/2023   Iron  deficiency anemia 09/21/2023   Left heart failure (HCC) 09/21/2023   Left ventricular failure (HCC) 09/21/2023   Pressure injury of skin 09/21/2023   Acute encephalopathy 09/18/2023   CKD stage 3b, GFR 30-44 ml/min (HCC) 08/14/2023   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   IDA (iron  deficiency anemia) 08/14/2023   Decubitus ulcer of sacral region, stage 2 (HCC) 08/14/2023   H/O urinary incontinence 08/14/2023   AMS (altered mental status) 05/09/2023   Chronic anticoagulation 05/08/2023   History of prostate cancer 04/26/2023   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   Renal insufficiency 04/26/2023   Cancer of splenic flexure (HCC) 04/19/2023   Syncope 02/16/2023   Atrial flutter (HCC) 12/27/2022   UTI (urinary tract infection) 12/27/2022   Acute metabolic encephalopathy 12/27/2022  Elevated troponin 12/27/2022   Elevated lipase 12/27/2022   Generalized weakness 12/27/2022   Type 2 diabetes mellitus with chronic kidney disease, with long-term current use of insulin  (HCC) 12/26/2022   AKI (acute kidney injury) (HCC) 12/26/2022   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/02/2021   Moderate protein-calorie malnutrition (HCC)    Septic shock (HCC) 11/27/2020   Overweight (BMI 25.0-29.9) 11/27/2020   Hyperlipidemia 11/26/2020   Hyperglycemia 11/26/2020   Hypertension 11/26/2020   PCP:  [name], MD Pharmacy:   [facility] [phone] - [place], [place] - [address]  [address] Phone: [phone] Fax:  [phone]     Social Drivers of Health (SDOH) Social History: SDOH Screenings   Food Insecurity: Food Insecurity Present (01/17/2024)  Housing: High Risk (01/17/2024)  Transportation Needs: No Transportation Needs (01/17/2024)  Utilities: At Risk (01/17/2024)  Depression (PHQ2-9): Low Risk  (10/09/2023)  Social Connections: Moderately Integrated (01/17/2024)  Recent Concern: Social Connections - Moderately Isolated (10/19/2023)  Tobacco Use: Medium Risk (01/17/2024)   SDOH Interventions:     Readmission Risk Interventions    01/18/2024   12:27 PM  Readmission Risk Prevention Plan  Transportation Screening Complete  Medication Review (RN Care Manager) Complete  PCP or Specialist appointment within 3-5 days of discharge Complete  HRI or Home Care Consult Complete  SW Recovery Care/Counseling Consult Complete  Palliative Care Screening Not Applicable  Skilled Nursing Facility Complete

## 2024-01-18 NOTE — Progress Notes (Signed)
 Consult Progress Note    Cory [name]   FMW:[id number]  DOB: 1946/02/26  DOA: 01/17/2024     1 PCP: [name], MD  Requesting provider: Dr. Sheldon Reason for consultation: medical management   Hospital Course: Cory [name] is a 78 year old male with PMH A-flutter, DM II, recent incidental CVA June 2025, CKD 3B, HTN, HLD, IDA. He was diagnosed with colon cancer in December 2024 and before able to schedule surgery he has had recurrent illnesses, hospitalizations, and recent stroke in June.  He has been able to schedule surgery recently and underwent clearance with neurology prior to surgery. He underwent robotic assisted sigmoid colectomy with anastomosis on 01/17/2024. We are asked to help assist with medical management postop.  Interval History:  More awake and alert today.  Resting comfortably when seen this morning.  Generalized abdominal pain from surgery as expected.  Denies any flatus or bowel movement yet.   Assessment and Plan: * Colon cancer [name]) - seen by Dr. Lanny 07/19/23; initially diagnosed 04/19/23 and had undergone evaluation with [facility] surgery back then but had some hospitalizations preventing surgery then stroke in June but now has finally undergone surgical intervention - per PET in Dec 2024: Hypermetabolic proximal descending colon mass. No evidence of locoregional or distant metastatic disease.  -Underwent surgical resection on 01/17/2024 -Further postop management per general surgery, primary team  Atrial flutter (HCC) - on eliquis  PTA; on hold for surgery; resumption per surgery; if prolonged off, can place on heparin  drip  - continue coreg   - PRN lopressor    HFrEF (heart failure with reduced ejection fraction) (HCC) - last echo 09/19/23: EF 25 to 30%, global hypokinesis, mild LVH, indeterminate diastolic parameters -Currently no signs/symptoms exacerbation - fluid status to be monitored postop - Follows with cardiology, last seen 10/12/2023 - Maintained  outpatient on Coreg , Norvasc , BiDil , telmisartan , torsemide  - no bidil  on med rec; will have pharmacy clarify  - Not a candidate for SGLT2 due to history of UTI per cardiology  Type 2 diabetes mellitus with chronic kidney disease, with long-term current use of insulin  (HCC) - Last A1c 10.5% on 01/05/2024 - Lantus  initially held postop given n.p.o. status and concern for potential hypoglycemia; he has in fact remained hyperglycemic -Lantus  resumed this morning; only on Lantus  daily at home, will decrease frequency back to home regimen - Continue SSI and CBG monitoring  CKD stage 3b, GFR 30-44 ml/min (HCC) - patient has history of CKD3b. 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  Hypertension - On amlodipine , Coreg , telmisartan , torsemide , spironolactone  - await med rec to clarify bidil  - Pressure controlled on current regimen, okay to continue  Hyperlipidemia - Continue Lipitor   Old records reviewed in assessment of  this patient  Antimicrobials:   DVT prophylaxis:  enoxaparin  (LOVENOX ) injection 30 mg Start: 01/18/24 0800 SCD's Start: 01/17/24 1726   Code Status:   Code Status: Full Code  Mobility Assessment (Last 72  Hours)     Mobility Assessment     Row Name 01/18/24 1251 01/18/24 1123 01/18/24 0825 01/17/24 2035 01/17/24 1743   Does the patient have exclusion criteria? -- -- No - Perform mobility assessment No - Perform mobility assessment No - Perform mobility assessment   What is the highest level of mobility based on the mobility assessment? Level 4 (Ambulates with assistance) - Balance while stepping forward/back - Complete Level 4 (Ambulates with assistance) - Balance while stepping forward/back - Complete Level 2 (Chairfast) - Balance while sitting on edge of bed and cannot stand Level 2 (Chairfast) - Balance while sitting on edge of bed and cannot stand  only dangled --   Is the above level different from baseline mobility prior to current illness? -- -- -- Yes - Recommend PT order --     Objective: Blood pressure 119/74, pulse 80, temperature 97.8 F (36.6 C), temperature source Oral, resp. rate 16, height 5' 11 (1.803 m), weight 85.9 kg, SpO2 100%.  Examination:  Physical Exam Constitutional:      General: He is not in acute distress.    Appearance: Normal appearance.  HENT:     Head: Normocephalic and atraumatic.     Mouth/Throat:     Mouth: Mucous membranes are moist.  Eyes:     Extraocular Movements: Extraocular movements intact.  Cardiovascular:     Rate and Rhythm: Normal rate and regular rhythm.  Pulmonary:     Effort: Pulmonary effort is normal. No respiratory distress.     Breath sounds: Normal breath sounds. No wheezing.  Abdominal:     General: Bowel sounds are normal. There is no distension.     Palpations: Abdomen is soft.     Tenderness: There is generalized abdominal tenderness (mild).     Comments: Multiple laparoscopic incisions noted and lower horizontal incision midline  Musculoskeletal:        General: Normal range of motion.     Cervical back: Normal range of motion and neck supple.  Skin:    General: Skin is warm and dry.  Neurological:     General: No  focal deficit present.     Mental Status: He is alert.  Psychiatric:        Mood and Affect: Mood normal.        Behavior: Behavior normal.      Data Reviewed: Results for orders placed or performed during the hospital encounter of 01/17/24 (from the past 24 hours)  Glucose, capillary     Status: Abnormal   Collection Time: 01/17/24  2:49 PM  Result Value Ref Range   Glucose-Capillary 187 (H) 70 - 99 mg/dL   Comment 1 Notify RN    Comment 2 Document in Chart   Glucose, capillary     Status: Abnormal   Collection Time: 01/17/24  5:39 PM  Result Value Ref Range   Glucose-Capillary 250 (H) 70 - 99 mg/dL  Glucose, capillary     Status: Abnormal   Collection Time: 01/17/24  8:58 PM  Result Value Ref Range   Glucose-Capillary 256 (H) 70 - 99 mg/dL  Glucose, capillary     Status: Abnormal   Collection Time: 01/17/24 10:27 PM  Result Value Ref Range   Glucose-Capillary 278 (H) 70 - 99 mg/dL  Glucose, capillary     Status: Abnormal   Collection Time: 01/17/24 11:57 PM  Result Value Ref Range   Glucose-Capillary 258 (H) 70 - 99 mg/dL  Basic metabolic panel     Status: Abnormal   Collection Time: 01/18/24  3:32 AM  Result Value Ref Range   Sodium 131 (L) 135 - 145 mmol/L   Potassium 4.6 3.5 - 5.1 mmol/L   Chloride 97 (L) 98 - 111 mmol/L   CO2 20 (L) 22 - 32 mmol/L   Glucose, Bld 256 (H) 70 - 99 mg/dL   BUN 24 (H) 8 - 23 mg/dL   Creatinine, Ser 8.15 (H) 0.61 - 1.24 mg/dL   Calcium  9.1 8.9 - 10.3 mg/dL   GFR, Estimated 38 (L) >60 mL/min   Anion gap 13 5 - 15  CBC     Status: Abnormal   Collection Time: 01/18/24  3:32 AM  Result Value Ref Range   WBC 7.3 4.0 - 10.5 K/uL   RBC 4.03 (L) 4.22 - 5.81 MIL/uL   Hemoglobin 10.7 (L) 13.0 - 17.0 g/dL   HCT 66.1 (L) 60.9 - 47.9 %   MCV 83.9 80.0 - 100.0 fL   MCH 26.6 26.0 - 34.0 pg   MCHC 31.7 30.0 - 36.0 g/dL   RDW 85.3 88.4 - 84.4 %   Platelets 192 150 - 400 K/uL   nRBC 0.0 0.0 - 0.2 %  Magnesium      Status: None   Collection  Time: 01/18/24  3:32 AM  Result Value Ref Range   Magnesium  2.1 1.7 - 2.4 mg/dL  Prealbumin     Status: None   Collection Time: 01/18/24  3:32 AM  Result Value Ref Range   Prealbumin 19 18 - 38 mg/dL  Phosphorus     Status: None   Collection Time: 01/18/24  3:32 AM  Result Value Ref Range   Phosphorus 3.8 2.5 - 4.6 mg/dL  Glucose, capillary     Status: Abnormal   Collection Time: 01/18/24  3:56 AM  Result Value Ref Range   Glucose-Capillary 251 (H) 70 - 99 mg/dL  Glucose, capillary     Status: Abnormal   Collection Time: 01/18/24  7:55 AM  Result Value Ref Range   Glucose-Capillary 211 (H) 70 - 99 mg/dL  Glucose, capillary     Status: Abnormal   Collection Time: 01/18/24 11:39 AM  Result Value Ref Range   Glucose-Capillary 169 (H) 70 - 99 mg/dL   Comment 1 Notify RN     I have reviewed pertinent nursing notes, vitals, labs, and images as necessary. I have ordered labwork to follow up on as indicated.  I have reviewed the last notes from staff over past 24 hours. I have discussed patient's care plan and test results with nursing staff, CM/SW, and other staff as appropriate.  Time spent: Greater than 50% of the 55 minute visit was spent in counseling/coordination of care for the patient as laid out in the A&P.   LOS: 1 day   Alm Apo, MD [facility] 01/18/2024, 1:30 PM

## 2024-01-19 ENCOUNTER — Ambulatory Visit: Payer: Self-pay | Admitting: Surgery

## 2024-01-19 DIAGNOSIS — C189 Malignant neoplasm of colon, unspecified: Secondary | ICD-10-CM

## 2024-01-19 DIAGNOSIS — I483 Typical atrial flutter: Secondary | ICD-10-CM | POA: Diagnosis not present

## 2024-01-19 DIAGNOSIS — Z794 Long term (current) use of insulin: Secondary | ICD-10-CM | POA: Diagnosis not present

## 2024-01-19 DIAGNOSIS — E1122 Type 2 diabetes mellitus with diabetic chronic kidney disease: Secondary | ICD-10-CM | POA: Diagnosis not present

## 2024-01-19 LAB — GLUCOSE, CAPILLARY
Glucose-Capillary: 153 mg/dL — ABNORMAL HIGH (ref 70–99)
Glucose-Capillary: 346 mg/dL — ABNORMAL HIGH (ref 70–99)
Glucose-Capillary: 266 mg/dL — ABNORMAL HIGH (ref 70–99)
Glucose-Capillary: 266 mg/dL — ABNORMAL HIGH (ref 70–99)

## 2024-01-19 LAB — BASIC METABOLIC PANEL WITH GFR
Anion gap: 13 (ref 5–15)
BUN: 28 mg/dL — ABNORMAL HIGH (ref 8–23)
CO2: 21 mmol/L — ABNORMAL LOW (ref 22–32)
Calcium: 9.1 mg/dL (ref 8.9–10.3)
Chloride: 99 mmol/L (ref 98–111)
Creatinine, Ser: 2.33 mg/dL — ABNORMAL HIGH (ref 0.61–1.24)
GFR, Estimated: 28 mL/min — ABNORMAL LOW (ref >60)
Glucose, Bld: 103 mg/dL — ABNORMAL HIGH (ref 70–99)
Potassium: 4.3 mmol/L (ref 3.5–5.1)
Sodium: 133 mmol/L — ABNORMAL LOW (ref 135–145)

## 2024-01-19 LAB — HEMOGLOBIN: Hemoglobin: 10.2 g/dL — ABNORMAL LOW (ref 13.0–17.0)

## 2024-01-19 MED ORDER — APIXABAN 5 MG PO TABS
5.0000 mg | ORAL_TABLET | Freq: Two times a day (BID) | ORAL | Status: DC
  Administered 2024-01-19 – 2024-01-24 (×11): 5 mg via ORAL
  Filled 2024-01-19 (×11): qty 1

## 2024-01-19 NOTE — Progress Notes (Signed)
 Consult Progress Note    Cory [name]   FMW:[id number]  DOB: May 07, 1946  DOA: 01/17/2024     2 PCP: Cory Lamar [name], MD  Requesting provider: Dr. Sheldon Reason for consultation: medical management   Hospital Course: Cory [name] is a 78 year old male with PMH A-flutter, DM II, recent incidental CVA June 2025, CKD 3B, HTN, HLD, IDA. He was diagnosed with colon cancer in December 2024 and before able to schedule surgery he has had recurrent illnesses, hospitalizations, and recent stroke in June.  He has been able to schedule surgery recently and underwent clearance with neurology prior to surgery. He underwent robotic assisted sigmoid colectomy with anastomosis on 01/17/2024. We are asked to help assist with medical management postop.  Interval History:  Remains awake and alert. Had some mucous stools this am and was incontinent of urine.  Family was endorsing he does not have help at home as he lives alone and there was concern for him returning home alone. After further evaluation with PT/OT, he was recommended for SNF.  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 Lanny 07/19/23; initially diagnosed 04/19/23 and had undergone evaluation with [facility] surgery back then but had some hospitalizations preventing surgery then stroke in June but now has finally undergone surgical intervention - per PET in Dec 2024: Hypermetabolic proximal descending colon mass. No evidence of locoregional or distant metastatic disease.  -Underwent surgical resection on 01/17/2024 - Path has returned back with invasive moderately differentiated adenocarcinoma -Further postop management per general surgery, primary team  Atrial flutter (HCC) - continue eliquis  and coreg  - PRN lopressor    HFrEF (heart failure with reduced ejection fraction) (HCC) - last echo 09/19/23: EF 25 to 30%, global hypokinesis, mild LVH, indeterminate diastolic parameters -Currently no signs/symptoms exacerbation -  fluid status to be monitored postop - Follows with cardiology, last seen 10/12/2023 - Maintained outpatient on Coreg , Norvasc , BiDil , telmisartan , torsemide  - no bidil  on med rec; will have pharmacy clarify  - Not a candidate for SGLT2 due to history of UTI per cardiology  Type 2 diabetes mellitus with chronic kidney disease, with long-term current use of insulin  (HCC) - Last A1c 10.5% on 01/05/2024 - continue Lantus  and SSI  CKD (chronic kidney disease) stage 4, GFR 15-29 ml/min (HCC) - patient has history of CKD3b. 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  Hypertension - On amlodipine , Coreg , telmisartan , torsemide , spironolactone  - await med rec to clarify bidil  - Pressure controlled on current regimen, okay to continue  Hyperlipidemia - Continue Lipitor   Old records reviewed in assessment of this patient  Antimicrobials:   DVT prophylaxis:  SCD's Start: 01/17/24 1726 apixaban  (ELIQUIS ) tablet 5 mg   Code Status:  Code Status: Full Code  Mobility Assessment (Last 72 Hours)     Mobility Assessment     Row Name 01/19/24 1122 01/19/24 1038 01/18/24 2001 01/18/24 1251 01/18/24 1123   Does the patient have  exclusion criteria? No - Perform mobility assessment -- No - Perform mobility assessment -- --   What is the highest level of mobility based on the mobility assessment? Level 4 (Ambulates with assistance) - Balance while stepping forward/back - Complete Level 4 (Ambulates with assistance) - Balance while stepping forward/back - Complete Level 4 (Ambulates with assistance) - Balance while stepping forward/back - Complete Level 4 (Ambulates with assistance) - Balance while stepping forward/back - Complete Level 4 (Ambulates with assistance) - Balance while stepping forward/back - Complete   Is the above level different from baseline mobility prior to current illness? Yes - Recommend PT order -- Yes - Recommend PT order -- --    Row Name 01/18/24 0825 01/17/24 2035 01/17/24 1743       Does the patient have exclusion criteria? No - Perform mobility assessment No - Perform mobility assessment No - Perform mobility assessment     What is the highest level of mobility based on the mobility assessment? Level 2 (Chairfast) - Balance while sitting on edge of bed and cannot stand Level 2 (Chairfast) - Balance while sitting on edge of bed and cannot stand  only dangled --     Is the above level different from baseline mobility prior to current illness? -- Yes - Recommend PT order --       Objective: Blood pressure (!) 153/96, pulse 87, temperature 98.2 F (36.8 C), temperature source Oral, resp. rate 16, height 5' 11 (1.803 m), weight 84.2 kg, SpO2 100%.  Examination:  Physical Exam Constitutional:      General: He is not in acute distress.    Appearance: Normal appearance.  HENT:     Head: Normocephalic and atraumatic.     Mouth/Throat:     Mouth: Mucous membranes are moist.  Eyes:     Extraocular Movements: Extraocular movements intact.  Cardiovascular:     Rate and Rhythm: Normal rate and regular rhythm.  Pulmonary:     Effort: Pulmonary effort is normal. No respiratory distress.     Breath  sounds: Normal breath sounds. No wheezing.  Abdominal:     General: Bowel sounds are normal. There is no distension.     Palpations: Abdomen is soft.     Tenderness: There is generalized abdominal tenderness (mild).     Comments: Multiple laparoscopic incisions noted and lower horizontal incision midline  Musculoskeletal:        General: Normal range of motion.     Cervical back: Normal range of motion and neck supple.  Skin:    General: Skin is warm and dry.  Neurological:     General: No focal deficit present.     Mental Status: He is alert.  Psychiatric:        Mood and Affect: Mood normal.        Behavior: Behavior normal.      Data Reviewed: Results for orders placed or performed during the hospital encounter of 01/17/24 (from the past 24 hours)  Glucose, capillary     Status: Abnormal   Collection Time: 01/18/24  4:20 PM  Result Value Ref Range   Glucose-Capillary 227 (H) 70 - 99 mg/dL  Glucose, capillary     Status: Abnormal   Collection Time: 01/18/24  8:35 PM  Result Value Ref Range   Glucose-Capillary 355 (H) 70 - 99 mg/dL  Basic metabolic panel     Status: Abnormal   Collection Time: 01/19/24  4:31 AM  Result Value Ref Range   Sodium 133 (L) 135 - 145 mmol/L   Potassium 4.3 3.5 - 5.1 mmol/L   Chloride 99 98 - 111 mmol/L   CO2 21 (L) 22 - 32 mmol/L   Glucose, Bld 103 (H) 70 - 99 mg/dL   BUN 28 (H) 8 - 23 mg/dL   Creatinine, Ser 7.66 (H) 0.61 - 1.24 mg/dL   Calcium  9.1 8.9 - 10.3 mg/dL   GFR, Estimated 28 (L) >60 mL/min   Anion gap 13 5 - 15  Hemoglobin     Status: Abnormal   Collection Time: 01/19/24  4:31 AM  Result Value Ref Range   Hemoglobin 10.2 (L) 13.0 - 17.0 g/dL  Glucose, capillary     Status: Abnormal   Collection Time: 01/19/24  8:40 AM  Result Value Ref Range   Glucose-Capillary 153 (H) 70 - 99 mg/dL  Glucose, capillary     Status: Abnormal   Collection Time: 01/19/24 12:31 PM  Result Value Ref Range   Glucose-Capillary 346 (H) 70 - 99 mg/dL     I have reviewed pertinent nursing notes, vitals, labs, and images as necessary. I have ordered labwork to follow up on as indicated.  I have reviewed the last notes from staff over past 24 hours. I have discussed patient's care plan and test results with nursing staff, CM/SW, and other staff as appropriate.  Time spent: Greater than 50% of the 55 minute visit was spent in counseling/coordination of care for the patient as laid out in the A&P.   LOS: 2 days   Alm Apo, MD [facility] 01/19/2024, 2:54 PM

## 2024-01-19 NOTE — Progress Notes (Signed)
 Patient status post sigmoid colectomy for cancer.  Pathology shows pT3pN0 stage II cancer.  49 lymph nodes sampled all negative for cancer.  Most likely can just continue with survival pathway.  Not a great candidate for chemotherapy given his age and numerous comorbidities anyway.  Will continue to follow.  Hopefully home soon.

## 2024-01-19 NOTE — Discharge Summary (Incomplete Revision)
 Physician Discharge Summary    Cory [name] MRN: [id number] DOB/AGE: 77-Dec-1947 = 77 y.o.  Patient Care Team: [name], MD as PCP - General (Family Medicine) [name], MD as PCP - Cardiology (Cardiology) [name], MD as Referring Physician (Cardiology) [name], MD as Referring Physician (Geriatric Medicine) [name], [name], NP as Nurse Practitioner (Nurse Practitioner) [name], MD as Consulting Physician (Hematology and Oncology) [name], MD as Referring Physician (Surgical Oncology) [name], [name], MD as Consulting Physician (Cardiology) [name], MD (Family Medicine) [name], MD as Consulting Physician (General Surgery) [name], MD as Consulting Physician (Neurology) [name], [name], FNP as WOC Nurse (Nurse Practitioner) [name], [name], FNP as WOC Nurse (Nurse Practitioner)  Admit date: 01/17/2024  Discharge date: [facility] Stay = 2 days    Discharge Diagnoses:  Principal Problem:   Colon cancer [facility]) Active Problems:   Hyperlipidemia   Hypertension   Overweight (BMI 25.0-29.9)   Type 2 diabetes mellitus with chronic kidney disease, with long-term current use of insulin  (HCC)   Atrial flutter (HCC)   CKD (chronic kidney disease) stage 4, GFR 15-29 ml/min (HCC)   IDA (iron  deficiency anemia)   History of prostate cancer   Hx of right BKA (HCC)   HFrEF (heart failure with reduced ejection fraction) (HCC)   Depression   GERD (gastroesophageal reflux disease)   History of CVA (cerebrovascular accident)   2 Days Post-Op  01/17/2024  POST-OPERATIVE DIAGNOSIS:   PROXIMAL DESCENDING COLON CANCER  SURGERY:  01/17/2024  Procedure(s): COLECTOMY, PARTIAL, ROBOT-ASSISTED, LAPAROSCOPIC SIGMOIDOSCOPY, FLEXIBLE  SURGEON:    Surgeon(s): [name], MD Teresa Lonni [name], MD  Consults: Case Management / Social Work, Physical Therapy, Occupational Therapy, Pharmacy, Anesthesia, and Internal  Medicine [facility])  Hospital Course:   The patient underwent the surgery above.  Postoperatively, the patient gradually mobilized and advanced to a solid diet.  Pain and other symptoms were treated aggressively.    By the time of discharge, the patient was walking with help well the hallways, eating food, having flatus.  Pain was well-controlled on an oral medications.  Based on meeting discharge criteria and continuing to recover, I felt it was safe for the patient to be discharged from the hospital to further recover with close followup. Postoperative recommendations were discussed in detail.  They are written as well.  Patient has no help at home and apparently family is out of town.  He feels not comfortable to get to the bathroom by himself and has a BKA and numerous health issues.  I recommended skilled nursing facility for the next 2 weeks to help him recover and be a little more independent.    Discharged Condition: fair  Discharge Exam: Blood pressure (!) 153/91, pulse 96, temperature 98.6 F (37 C), temperature source Oral, resp. rate 15, height 5' 11 (1.803 m), weight 84.2 kg, SpO2 99%.  General: Pt awake/alert/oriented x4 in No acute distress Eyes: PERRL, normal EOM.  Sclera clear.  No icterus Neuro: CN II-XII intact w/o focal sensory/motor deficits. Lymph: No head/neck/groin lymphadenopathy Psych:  No delerium/psychosis/paranoia HENT: Normocephalic, Mucus membranes moist.  No thrush Neck: Supple, No tracheal deviation Chest: No chest wall pain w good excursion CV:  Pulses intact.  Regular rhythm MS: Normal AROM mjr joints.  No obvious deformity Abdomen: Soft.  Nondistended.  Nontender.  No evidence of peritonitis.  No incarcerated hernias. Ext:  SCDs BLE.  R BKA.  No mjr edema.  No  cyanosis Skin: No petechiae / purpura   Disposition:    Follow-up Information     [name], MD Follow up on 02/06/2024.   Specialties: General Surgery, Colon and Rectal  Surgery Contact information: [address] [place] [address] [phone]         Care, [facility] Follow up.   Specialty: Home Health Services Why: Home Health :   Physical Therapy Occupational Therapy Contact information: [address] [place] [address] [phone]                 Discharge disposition: 03-Skilled Nursing Facility         Allergies as of 01/19/2024       Reactions   Fish Allergy Other (See Comments)   No un scaled fish!!   Pork-derived Products Other (See Comments)   Will not eat pork and religious reasons; ok to receive Lovenox    Shellfish Allergy Other (See Comments)   Will not eat shellfish   Pumpkin Seed Oil Rash        Medication List     TAKE these medications    acetaminophen  325 MG tablet Commonly known as: TYLENOL  Take 2 tablets (650 mg total) by mouth every 6 (six) hours as needed for mild pain, headache or fever (or Fever >/= 101). What changed:  when to take this reasons to take this   amLODipine  5 MG tablet Commonly known as: NORVASC  Take 1 tablet (5 mg total) by mouth daily.   atorvastatin  20 MG tablet Commonly known as: LIPITOR Take 20 mg by mouth at bedtime.   bisacodyl  5 MG EC tablet Generic drug: bisacodyl  Take 5 mg by mouth daily as needed for moderate constipation.   carvedilol  25 MG tablet Commonly known as: COREG  Take 25 mg by mouth 2 (two) times daily with a meal.   Dulaglutide  1.5 MG/0.5ML Soaj Inject 1.5 mg into the skin once a week. Tuesday's   Eliquis  5 MG Tabs tablet Generic drug: apixaban  Take 5 mg by mouth 2 (two) times daily.   insulin  lispro 100 UNIT/ML injection Commonly known as: HUMALOG  Inject 0-12 Units into the skin with breakfast, with lunch, and with evening meal.   Lantus  SoloStar 100 UNIT/ML Solostar Pen Generic drug: insulin  glargine Inject 15 Units into the skin at bedtime.   ondansetron  4 MG tablet Commonly known as:  ZOFRAN  Take 4 mg by mouth every 8 (eight) hours as needed for nausea or vomiting.   pantoprazole  40 MG tablet Commonly known as: PROTONIX  Take 730 mg by mouth daily.   potassium chloride  10 MEQ tablet Commonly known as: KLOR-CON  Take 20 mEq by mouth daily.   sertraline  50 MG tablet Commonly known as: ZOLOFT  Take 50 mg by mouth daily.   spironolactone  100 MG tablet Commonly known as: ALDACTONE  Take 100 mg by mouth daily.   tamsulosin  0.4 MG Caps capsule Commonly known as: FLOMAX  Take 0.4 mg by mouth daily.   telmisartan  40 MG tablet Commonly known as: MICARDIS  Take 40 mg by mouth daily.   torsemide  20 MG tablet Commonly known as: DEMADEX  Take 20 mg by mouth daily.   Vitamin D  (Ergocalciferol ) 1.25 MG (50000 UNIT) Caps capsule Commonly known as: DRISDOL  Take 50,000 Units by mouth once a week. Sundays        Significant Diagnostic Studies:  Results for orders placed or performed during the hospital encounter of 01/17/24 (from the past 72 hours)  Glucose, capillary  Status: None   Collection Time: 01/17/24 10:36 AM  Result Value Ref Range   Glucose-Capillary 99 70 - 99 mg/dL    Comment: Glucose reference range applies only to samples taken after fasting for at least 8 hours.  Glucose, capillary     Status: Abnormal   Collection Time: 01/17/24  2:49 PM  Result Value Ref Range   Glucose-Capillary 187 (H) 70 - 99 mg/dL    Comment: Glucose reference range applies only to samples taken after fasting for at least 8 hours.   Comment 1 Notify RN    Comment 2 Document in Chart   Glucose, capillary     Status: Abnormal   Collection Time: 01/17/24  5:39 PM  Result Value Ref Range   Glucose-Capillary 250 (H) 70 - 99 mg/dL    Comment: Glucose reference range applies only to samples taken after fasting for at least 8 hours.  Glucose, capillary     Status: Abnormal   Collection Time: 01/17/24  8:58 PM  Result Value Ref Range   Glucose-Capillary 256 (H) 70 - 99 mg/dL     Comment: Glucose reference range applies only to samples taken after fasting for at least 8 hours.  Glucose, capillary     Status: Abnormal   Collection Time: 01/17/24 10:27 PM  Result Value Ref Range   Glucose-Capillary 278 (H) 70 - 99 mg/dL    Comment: Glucose reference range applies only to samples taken after fasting for at least 8 hours.  Glucose, capillary     Status: Abnormal   Collection Time: 01/17/24 11:57 PM  Result Value Ref Range   Glucose-Capillary 258 (H) 70 - 99 mg/dL    Comment: Glucose reference range applies only to samples taken after fasting for at least 8 hours.  Basic metabolic panel     Status: Abnormal   Collection Time: 01/18/24  3:32 AM  Result Value Ref Range   Sodium 131 (L) 135 - 145 mmol/L   Potassium 4.6 3.5 - 5.1 mmol/L   Chloride 97 (L) 98 - 111 mmol/L   CO2 20 (L) 22 - 32 mmol/L   Glucose, Bld 256 (H) 70 - 99 mg/dL    Comment: Glucose reference range applies only to samples taken after fasting for at least 8 hours.   BUN 24 (H) 8 - 23 mg/dL   Creatinine, Ser 8.15 (H) 0.61 - 1.24 mg/dL   Calcium  9.1 8.9 - 10.3 mg/dL   GFR, Estimated 38 (L) >60 mL/min    Comment: (NOTE) Calculated using the CKD-EPI Creatinine Equation (2021)    Anion gap 13 5 - 15    Comment: Performed at [facility], [address] [place], [place] [address]  CBC     Status: Abnormal   Collection Time: 01/18/24  3:32 AM  Result Value Ref Range   WBC 7.3 4.0 - 10.5 K/uL   RBC 4.03 (L) 4.22 - 5.81 MIL/uL   Hemoglobin 10.7 (L) 13.0 - 17.0 g/dL   HCT 66.1 (L) 60.9 - 47.9 %   MCV 83.9 80.0 - 100.0 fL   MCH 26.6 26.0 - 34.0 pg   MCHC 31.7 30.0 - 36.0 g/dL   RDW 85.3 88.4 - 84.4 %   Platelets 192 150 - 400 K/uL   nRBC 0.0 0.0 - 0.2 %    Comment: Performed at [facility], [address] [place], [place] [address]  Magnesium      Status: None   Collection Time: 01/18/24  3:32 AM  Result Value Ref Range   Magnesium  2.1 1.7 - 2.4 mg/dL     Comment: Performed at [facility], [address] [place], [place] [address]  Prealbumin     Status: None   Collection Time: 01/18/24  3:32 AM  Result Value Ref Range   Prealbumin 19 18 - 38 mg/dL    Comment: Performed at [facility], [address], [place], [place] [address]  Phosphorus     Status: None   Collection Time: 01/18/24  3:32 AM  Result Value Ref Range   Phosphorus 3.8 2.5 - 4.6 mg/dL    Comment: Performed at [facility], [address], [place], [place] [address]  Glucose, capillary     Status: Abnormal   Collection Time: 01/18/24  3:56 AM  Result Value Ref Range   Glucose-Capillary 251 (H) 70 - 99 mg/dL    Comment: Glucose reference range applies only to samples taken after fasting for at least 8 hours.  Glucose, capillary     Status: Abnormal   Collection Time: 01/18/24  7:55 AM  Result Value Ref Range   Glucose-Capillary 211 (H) 70 - 99 mg/dL    Comment: Glucose reference range applies only to samples taken after fasting for at least 8 hours.  Glucose, capillary     Status: Abnormal   Collection Time: 01/18/24 11:39 AM  Result Value Ref Range   Glucose-Capillary 169 (H) 70 - 99 mg/dL    Comment: Glucose reference range applies only to samples taken after fasting for at least 8 hours.   Comment 1 Notify RN   Glucose, capillary     Status: Abnormal   Collection Time: 01/18/24  4:20 PM  Result Value Ref Range   Glucose-Capillary 227 (H) 70 - 99 mg/dL    Comment: Glucose reference range applies only to samples taken after fasting for at least 8 hours.  Glucose, capillary     Status: Abnormal   Collection Time: 01/18/24  8:35 PM  Result Value Ref Range   Glucose-Capillary 355 (H) 70 - 99 mg/dL    Comment: Glucose reference range applies only to samples taken after fasting for at least 8 hours.  Basic metabolic panel     Status: Abnormal   Collection Time: 01/19/24  4:31 AM  Result Value Ref Range   Sodium 133 (L) 135  - 145 mmol/L   Potassium 4.3 3.5 - 5.1 mmol/L   Chloride 99 98 - 111 mmol/L   CO2 21 (L) 22 - 32 mmol/L   Glucose, Bld 103 (H) 70 - 99 mg/dL    Comment: Glucose reference range applies only to samples taken after fasting for at least 8 hours.   BUN 28 (H) 8 - 23 mg/dL   Creatinine, Ser 7.66 (H) 0.61 - 1.24 mg/dL   Calcium  9.1 8.9 - 10.3 mg/dL   GFR, Estimated 28 (L) >60 mL/min    Comment: (NOTE) Calculated using the CKD-EPI Creatinine Equation (2021)    Anion gap 13 5 - 15    Comment: Performed at [facility], [address] [place], [place] [address]  Hemoglobin     Status: Abnormal   Collection Time: 01/19/24  4:31 AM  Result Value Ref Range   Hemoglobin 10.2 (L) 13.0 - 17.0 g/dL    Comment: Performed at [facility], [address], [place], [place] [address]    No results found.  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   Prostate CA (HCC)    Prostate CA (HCC)    Sepsis (HCC) 11/27/2020   Stroke [facility])     Past Surgical History:  Procedure Laterality Date   AMPUTATION Right 12/04/2020   Procedure: RIGHT BELOW KNEE AMPUTATION;  Surgeon: [name], MD;  Location: [facility] OR;  Service: Orthopedics;  Laterality: Right;   APPENDECTOMY     APPLICATION OF WOUND VAC Right 12/04/2020   Procedure: APPLICATION OF WOUND VAC;  Surgeon: [name], MD;  Location: [facility] OR;  Service: Orthopedics;  Laterality: Right;   FLEXIBLE SIGMOIDOSCOPY N/A 01/17/2024   Procedure: KINGSTON SIDE;  Surgeon: [name], MD;   Location: [facility] ORS;  Service: General;  Laterality: N/A;   MINOR HEMORRHOIDECTOMY      Social History   Socioeconomic History   Marital status: Married    Spouse name: Not on file   Number of children: 6   Years of education: Not on file   Highest education level: Not on file  Occupational History   Not on file  Tobacco Use   Smoking status: Former    Types: Cigarettes   Smokeless tobacco: Never  Vaping Use   Vaping status: Never Used  Substance and Sexual Activity   Alcohol use: Never   Drug use: Never   Sexual activity: Not Currently  Other Topics Concern   Not on file  Social History Narrative   ** Merged History Encounter ** Originally from [place].     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      Lived in [place]  [phone].   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   Right handed   Caffeine-none   Social Drivers of Health   Financial Resource Strain: Not on file  Food Insecurity: Food Insecurity Present (01/17/2024)   Hunger Vital Sign    Worried About Running Out of Food in the Last Year: Sometimes true    Ran Out of Food in the Last Year: Sometimes true  Transportation Needs: No Transportation Needs (01/17/2024)   PRAPARE - Administrator, Civil Service (Medical): No    Lack of Transportation (Non-Medical): No  Physical Activity: Not on file  Stress: Not on file  Social Connections: Moderately Integrated (01/17/2024)   Social Connection and Isolation Panel    Frequency of Communication with Friends and Family: Once a week    Frequency of Social Gatherings with Friends and Family: Once a week    Attends Religious Services: 1 to 4 times per year    Active Member of [employer] or Organizations: No    Attends Banker Meetings: 1 to 4 times per year    Marital Status: Married  Recent Concern: Social Connections - Moderately Isolated (10/19/2023)   Social Connection and Isolation Panel    Frequency  of Communication with Friends and Family: Once a week  Frequency of Social Gatherings with Friends and Family: Once a week    Attends Religious Services: 1 to 4 times per year    Active Member of [employer] or Organizations: No    Attends Engineer, structural: 1 to 4 times per year    Marital Status: Never married  Intimate Partner Violence: Not At Risk (01/17/2024)   Humiliation, Afraid, Rape, and Kick questionnaire    Fear of Current or Ex-Partner: No    Emotionally Abused: No    Physically Abused: No    Sexually Abused: No    Family History  Problem Relation Age of Onset   Diabetes Mother    Hypertension Mother    Diabetes Father    Hypertension Father    Cancer Sister 52       breast cancer    Current Facility-Administered Medications  Medication Dose Route Frequency Provider Last Rate Last Admin   0.9 %  sodium chloride  infusion   Intravenous Q8H PRN [name], MD       0.9 %  sodium chloride  infusion  250 mL Intravenous PRN Shekira Drummer, Standing, MD       acetaminophen  (TYLENOL ) tablet 1,000 mg  1,000 mg Oral Q6H Marciano Mundt, MD   1,000 mg at 01/18/24 2104   alum & mag hydroxide-simeth (MAALOX/MYLANTA) 200-200-20 MG/5ML suspension 30 mL  30 mL Oral Q6H PRN [name], MD       alvimopan  (ENTEREG ) capsule 12 mg  12 mg Oral BID [name], MD   12 mg at 01/18/24 1002   amLODipine  (NORVASC ) tablet 5 mg  5 mg Oral Daily Bailey Kolbe, Standing, MD       apixaban  (ELIQUIS ) tablet 5 mg  5 mg Oral BID [name], MD       atorvastatin  (LIPITOR) tablet 20 mg  20 mg Oral QHS Jaquavian Firkus, MD   20 mg at 01/18/24 2114   carvedilol  (COREG ) tablet 25 mg  25 mg Oral BID WC [name], MD   25 mg at 01/18/24 1713   diphenhydrAMINE  (BENADRYL ) 12.5 MG/5ML elixir 12.5 mg  12.5 mg Oral Q6H PRN [name], MD       Or   diphenhydrAMINE  (BENADRYL ) injection 12.5 mg  12.5 mg Intravenous Q6H PRN Lehi Phifer, Standing, MD       feeding supplement (GLUCERNA SHAKE) (GLUCERNA SHAKE) liquid 237 mL   237 mL Oral BID BM [name], MD   237 mL at 01/18/24 1303   gabapentin  (NEURONTIN ) capsule 200 mg  200 mg Oral QHS Renner Sebald, MD   200 mg at 01/18/24 2114   hydrALAZINE  (APRESOLINE ) injection 10 mg  10 mg Intravenous Q2H PRN [name], MD       HYDROmorphone  (DILAUDID ) injection 0.5-2 mg  0.5-2 mg Intravenous Q4H PRN [name], MD   1 mg at 01/18/24 0404   insulin  aspart (novoLOG ) injection 0-15 Units  0-15 Units Subcutaneous TID AC & HS Patsy Lenis, MD   15 Units at 01/18/24 2113   insulin  glargine (LANTUS ) injection 15 Units  15 Units Subcutaneous Daily Patsy Lenis, MD       magic mouthwash  15 mL Oral QID PRN [name], MD       melatonin tablet 3 mg  3 mg Oral QHS PRN [name], MD   3 mg at 01/18/24 2114   menthol -cetylpyridinium (CEPACOL) lozenge 3 mg  1 lozenge Oral PRN [name], MD       metoprolol  tartrate (LOPRESSOR ) injection 5 mg  5 mg Intravenous Q6H PRN [name], MD       metoprolol  tartrate (LOPRESSOR ) injection 5 mg  5 mg Intravenous Q2H PRN [name], MD       naphazoline-glycerin  (CLEAR EYES REDNESS) ophth solution 1-2 drop  1-2 drop Both Eyes QID PRN [name], MD       ondansetron  (ZOFRAN ) tablet 4 mg  4 mg Oral Q6H PRN [name], MD       Or   ondansetron  (ZOFRAN ) injection 4 mg  4 mg Intravenous Q6H PRN [name], MD   4 mg at 01/18/24 0403   pantoprazole  (PROTONIX ) EC tablet 40 mg  40 mg Oral Daily [name], MD   40 mg at 01/18/24 1003   phenol (CHLORASEPTIC) mouth spray 2 spray  2 spray Mouth/Throat PRN [name], MD       polycarbophil (FIBERCON) tablet 625 mg  625 mg Oral BID [name], MD   625 mg at 01/18/24 2114   prochlorperazine  (COMPAZINE ) tablet 10 mg  10 mg Oral Q6H PRN [name], MD       Or   prochlorperazine  (COMPAZINE ) injection 5-10 mg  5-10 mg Intravenous Q6H PRN [name], MD       sertraline  (ZOLOFT ) tablet 50 mg  50 mg Oral Daily [name], MD   50 mg at 01/18/24 1002    simethicone  (MYLICON) chewable tablet 40 mg  40 mg Oral Q6H PRN [name], MD       sodium chloride  (OCEAN) 0.65 % nasal spray 1-2 spray  1-2 spray Each Nare Q6H PRN [name], MD       sodium chloride  flush (NS) 0.9 % injection 3 mL  3 mL Intravenous Q12H Kyndra Condron, MD   3 mL at 01/18/24 2114   sodium chloride  flush (NS) 0.9 % injection 3 mL  3 mL Intravenous PRN [name], MD       spironolactone  (ALDACTONE ) tablet 100 mg  100 mg Oral Daily [name], MD   100 mg at 01/18/24 1302   tamsulosin  (FLOMAX ) capsule 0.4 mg  0.4 mg Oral Daily [name], MD   0.4 mg at 01/18/24 1003   torsemide  (DEMADEX ) tablet 20 mg  20 mg Oral Daily [name], MD   20 mg at 01/18/24 1003   traMADol  (ULTRAM ) tablet 50-100 mg  50-100 mg Oral Q12H PRN [name], MD       [START ON 01/21/2024] Vitamin D  (Ergocalciferol ) (DRISDOL ) 1.25 MG (50000 UNIT) capsule 50,000 Units  50,000 Units Oral Weekly [name], MD         Allergies  Allergen Reactions   Fish Allergy Other (See Comments)    No un scaled fish!!   Pork-Derived Products Other (See Comments)    Will not eat pork and religious reasons; ok to receive Lovenox    Shellfish Allergy Other (See Comments)    Will not eat shellfish   Pumpkin Seed Oil Rash    Signed:   [name], MD, FACS, MASCRS Esophageal, Gastrointestinal & Colorectal Surgery Robotic and Minimally Invasive Surgery  [facility] [address] [place], [place] [address] [phone] Fax [phone] Main  CONTACT INFORMATION: Weekday (9AM-5PM): Call [facility] main office at [phone] Weeknight (5PM-9AM) or Weekend/Holiday: Check EPIC Web Links tab & use AMION (password  [email or web address]) for General Surgery [facility] coverage  Please, DO NOT use SecureChat  (it is not reliable communication to reach operating surgeons &  will lead to a delay in care).   Epic staff messaging available for outptient  concerns needing 1-2 business day response.      01/19/2024, 8:13 AM

## 2024-01-19 NOTE — Discharge Summary (Addendum)
 Physician Discharge Summary    Tallen Schnorr MRN: [id number] DOB/AGE: 05/11/1945 = 78 y.o.  Patient Care Team: [name], MD as PCP - General (Family Medicine) [name], MD as PCP - Cardiology (Cardiology) [name], MD as Referring Physician (Cardiology) [name], MD as Referring Physician (Geriatric Medicine) [name], [name], NP as Nurse Practitioner (Nurse Practitioner) [name], MD as Consulting Physician (Hematology and Oncology) [name], MD as Referring Physician (Surgical Oncology) [name], [name], MD as Consulting Physician (Cardiology) [name], MD (Family Medicine) [name], MD as Consulting Physician (General Surgery) [name], MD as Consulting Physician (Neurology) [name], [name], FNP as WOC Nurse (Nurse Practitioner) [name], [name], FNP as WOC Nurse (Nurse Practitioner)  Admit date: 01/17/2024  Discharge date: 01/24/2024   Hospital Stay = 7 days    Discharge Diagnoses:  Principal Problem:   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[facility]) Active Problems:   Hyperlipidemia   Hypertension   Overweight (BMI 25.0-29.9)   Type 2 diabetes mellitus with chronic kidney disease, with long-term current use of insulin  (HCC)   Atrial flutter (HCC)   CKD (chronic kidney disease) stage 4, GFR 15-29 ml/min (HCC)   IDA (iron  deficiency anemia)   History of prostate cancer   Hx of right BKA (HCC)   HFrEF (heart failure with reduced ejection fraction) (HCC)   Depression   GERD (gastroesophageal reflux disease)   History of CVA (cerebrovascular accident)   7 Days Post-Op  01/17/2024  POST-OPERATIVE DIAGNOSIS:  PROXIMAL SIGMOID COLON CANCER   PROCEDURE:   -ROBOTIC SIGMOID COLECTOMY WITH ANASTOMOSIS -INTRAOPERATIVE ASSESSMENT OF TISSUE VASCULAR PERFUSION USING ICG (indocyanine green ) IMMUNOFLUORESCENCE -TRANSVERSUS ABDOMINIS PLANE (TAP) BLOCK - BILATERAL -FLEXIBLE SIGMOIDOSCOPY   SURGEON:  [name], MD  OR FINDINGS:    Patient had proximal sigmoid colon cancer flopped up into the left upper quadrant.  No obvious metastatic disease on visceral parietal peritoneum or liver.   It is a 31mm EEA anastomosis ( mid descending colon connected to proximal rectum.)  It rests 14 cm from the anal verge by flexible sigmoidoscopy  PATHOLOGY: Pathologic Stage Classification (pTNM, AJCC 8th Edition): pT3, pN0  Invasive moderately differentiated adenocarcinoma invading through muscularis propria (pT3) Tumor measures 5.2 x 4.1 x 1.6 cm Tumor arises within a tubular adenoma with high-grade dysplasia Negative for angiolymphatic and perineural invasion Margins free of carcinoma and dysplasia Forty-nine pericolic lymph nodes, negative for metastatic carcinoma (0/49, pN0) Atypical lymphoid follicular hyperplasia (see comment) One additional tubular adenoma   Mismatch Repair Protein (IHC): NORMAL  There is preserved expression of the major MMR proteins. There is a very low probability that microsatellite instability (MSI) is present. However, certain clinically significant MMR protein mutations may result in preservation of nuclear expression. It is recommended that the preservation of protein expression be correlated with molecular based MSI testing.  IHC EXPRESSION RESULTS TEST           RESULT MLH1:          Preserved nuclear expression MSH2:          Preserved nuclear expression MSH6:          Preserved nuclear expression PMS2:          Preserved nuclear expression    Consults: Case Management / Social Work, Physical Therapy, Occupational Therapy, Pharmacy, Anesthesia, and Internal Medicine (Hospitalist)  Hospital Course:   The patient underwent the surgery above.  Postoperatively, the patient gradually mobilized and advanced to a solid diet.  Pain and other symptoms were treated aggressively.  By the time of discharge, the patient was walking with help well the hallways, eating food, having flatus & BMs.   Pain was well-controlled on an oral medications.   Patient had very limited independence.  Initially insisting on going home by himself which I did not feel was a safe discharge plan.  Limited availability of family to help.  Therefore with the help of therapies and case management, patient agreed to short-term skilled nursing facility.  He had been at [facility] before and agreed to that.  Then patient family declined skilled nursing facility and were willing to help take care of at home, so plan was changed again to home health with physical and Occupational Therapy visits and nursing aide.  Discharged Condition: fair but stable (his baseline)  Discharge Exam: Blood pressure 133/75, pulse 65, temperature 98.1 F (36.7 C), temperature source Oral, resp. rate 18, height 5' 11 (1.803 m), weight 82.5 kg, SpO2 99%.  General: Pt awake/alert/oriented x4 in No acute distress Eyes: PERRL, normal EOM.  Sclera clear.  No icterus Neuro: CN II-XII intact w/o focal sensory/motor deficits. Lymph: No head/neck/groin lymphadenopathy Psych:  No delerium/psychosis/paranoia HENT: Normocephalic, Mucus membranes moist.  No thrush Neck: Supple, No tracheal deviation Chest: No chest wall pain w good excursion CV:  Pulses intact.  Regular rhythm MS: Normal AROM mjr joints.  No obvious deformity Abdomen: Soft.  Nondistended.  Nontender.  No evidence of peritonitis.  No incarcerated hernias. Ext:  SCDs BLE.  R BKA.  No mjr edema.  No cyanosis Skin: No petechiae / purpura   Disposition:    Follow-up Information     [name], MD Follow up on 02/06/2024.   Specialties: General Surgery, Colon and Rectal Surgery Contact information: [address] [place] [address] [phone]         [facility], [facility] Follow up.   Specialty: Home Health Services Why: Home Health :   Physical Therapy Occupational Therapy Contact information: [address] [place]  [address] [phone]                 Discharge disposition: 06-Home-Health Care Svc         Allergies as of 01/24/2024       Reactions   Fish Allergy Other (See Comments)   No un scaled fish!!   Pork-derived Products Other (See Comments)   Will not eat pork and religious reasons; ok to receive Lovenox    Shellfish Allergy Other (See Comments)   Will not eat shellfish   Pumpkin Seed Oil Rash        Medication List     STOP taking these medications    potassium chloride  10 MEQ tablet Commonly known as: KLOR-CON    spironolactone  100 MG tablet Commonly known as: ALDACTONE    telmisartan  40 MG tablet Commonly known as: MICARDIS        TAKE these medications    acetaminophen  325 MG tablet Commonly known as: TYLENOL  Take 2 tablets (650 mg total) by mouth every 6 (six) hours as needed for mild pain, headache or fever (or Fever >/= 101). What changed:  when to take this reasons to take this   amLODipine  5 MG tablet Commonly known as: NORVASC  Take 1 tablet (5 mg total) by mouth daily.   atorvastatin  20 MG tablet Commonly known as: LIPITOR Take 20 mg by mouth at bedtime.   bisacodyl  5 MG EC tablet Generic drug: bisacodyl   Take 5 mg by mouth daily as needed for moderate constipation.   carvedilol  25 MG tablet Commonly known as: COREG  Take 25 mg by mouth 2 (two) times daily with a meal.   Dulaglutide  1.5 MG/0.5ML Soaj Inject 1.5 mg into the skin once a week. Tuesday's   Eliquis  5 MG Tabs tablet Generic drug: apixaban  Take 5 mg by mouth 2 (two) times daily.   insulin  lispro 100 UNIT/ML injection Commonly known as: HUMALOG  Inject 0-12 Units into the skin with breakfast, with lunch, and with evening meal.   Lantus  SoloStar 100 UNIT/ML Solostar Pen Generic drug: insulin  glargine Inject 15 Units into the skin at bedtime.   ondansetron  4 MG tablet Commonly known as: ZOFRAN  Take 4 mg by mouth every 8 (eight) hours as needed for nausea or vomiting.    pantoprazole  40 MG tablet Commonly known as: PROTONIX  Take 1 tablet (40 mg total) by mouth daily. What changed: how much to take   sertraline  50 MG tablet Commonly known as: ZOLOFT  Take 50 mg by mouth daily.   tamsulosin  0.4 MG Caps capsule Commonly known as: FLOMAX  Take 0.4 mg by mouth daily.   torsemide  20 MG tablet Commonly known as: DEMADEX  Take 20 mg by mouth daily.   Vitamin D  (Ergocalciferol ) 1.25 MG (50000 UNIT) Caps capsule Commonly known as: DRISDOL  Take 50,000 Units by mouth once a week. Sundays        Significant Diagnostic Studies:  FINAL MICROSCOPIC DIAGNOSIS:  A. RECTOSIGMOID COLON, MASS, PARTIAL COLECTOMY: Invasive moderately differentiated adenocarcinoma invading through muscularis propria (pT3) Tumor measures 5.2 x 4.1 x 1.6 cm Tumor arises within a tubular adenoma with high-grade dysplasia Negative for angiolymphatic and perineural invasion Margins free of carcinoma and dysplasia Forty-nine pericolic lymph nodes, negative for metastatic carcinoma (0/49, pN0) Atypical lymphoid follicular hyperplasia (see comment) One additional tubular adenoma  B. PROXIMAL MARGIN, EXCISION: Benign colon, negative for carcinoma  ONCOLOGY TABLE:  COLON AND RECTUM, CARCINOMA:  Resection  Procedure: Left partial colectomy Tumor Site: Proximal sigmoid colon Tumor Size: 5.2 x 4.1 x 1.6 cm Macroscopic Tumor Perforation: Not identified Macroscopic Evaluation of Mesorectum (required for rectal cancer): Proximal mesorectum complete Histologic Type: Adenocarcinoma Histologic Grade: Moderately differentiated (low-grade) Multiple Primary Sites: Not applicable Tumor Extension: Tumor invades through muscularis propria into pericolic soft tissue Lymphovascular Invasion: Not identified Perineural Invasion: Not identified Treatment Effect: No known presurgical therapy Margins:      Margin Status for Invasive Carcinoma: All margins negative for invasive carcinoma       Distance from Invasive Carcinoma to Radial (Circumferential) Margin: 8.2 cm      Distance from Invasive Carcinoma to Closest Mucosal Margin: 4 cm, proximal (final proximal margin 5 cm)      Margin Status for Non-Invasive Tumor: All margins negative for dysplasia Regional Lymph Nodes:      Number of Lymph Nodes with Tumor: 0      Number of Lymph Nodes Examined: 49 Tumor Deposits: Not identified Distant Metastasis: Not applicable Pathologic Stage Classification (pTNM, AJCC 8th Edition): pT3, pN0 Ancillary Studies: MMR and MSI testing has been ordered, and these results will be issued within addendums. Representative Tumor Block: A5, A6 Comments: Many of lymph nodes exhibit a striking follicular hyperplasia with the majority of the follicles exhibiting normal polarity with tingible-body macrophages.  Within several of the lymph nodes, there is progressive transformation of germinal centers and naked germinal centers.  A formal hematopathology consultation has been requested, the results of which will be issued within an addendum  to this report. (v4.2.0.1)    Damany Eastman DESCRIPTION:  A.  Specimen: Received fresh is rectosigmoid colon, open end proximal. Specimen integrity: Intact Specimen length: 17 cm in length by 4.1 cm in diameter Mesorectal intactness: The most proximal portion of the mesorectal fat is present and intact.  There is a 7.0 cm in length by 0.7 cm in diameter grossly atherosclerotic vessel adjacent to the mesorectum. Tumor location: Located within the central bowel is a tan, centrally ulcerated lesion with heaped up borders. Tumor size: 5.2 x 4.1 cm Percent of bowel circumference involved: 75% Tumor distance to margins:                      Proximal: 4.0 cm                      Distal: 9.2 cm                      Mesenteric (sigmoid and transverse): 8.2 cm Macroscopic extent of tumor invasion: The lesion grossly extends to the serosa and may involve the pericolic  fat. Total presumed lymph nodes: The pericolic fat is palpated and 49 lymph node candidates ranging from 0.1 to 1.6 cm are identified. Extramural satellite tumor nodules: None grossly identified. Mucosal polyp(s): There is a 0.5 x 0.4 x 0.3 cm polypoid nodule confined to the level of the mucosa located 4.5 cm from the lesion, 9.5 cm from the proximal margin, 8 cm from the distal margin, and 9 cm from the mesenteric margin.  No further distinct lesions are grossly identified. Additional findings: The uninvolved mucosa is pink-tan and glistening, without further distinct lesions, and with a wall thickness of 0.6 cm. Block summary: A1 = proximal margin A2 = distal margin A3 = mesenteric margin closest to lesion A4 = vasculature adjacent to mesorectum A5 = lesion to deepest extent A6-A8 = additional lesion, full-thickness A9 = polypoid nodule, entirely A10-A20 = 44 lymph node candidates, whole (4 per block) A21 = 2 lymph node candidates, whole A22 = 2 lymph node candidates, whole A23-A24 = 1 lymph node candidate, serially sectioned  B. Received fresh is a 1.0 cm in length by 2.1 cm in diameter ring of bowel, clinically proximal margin.  The mucosa is tan and glistening, without distinct lesions.  The new margin is submitted in 1 block. MARYSUE, 01/18/2024)   Final Diagnosis performed by Prentice Pitcher, MD.   Electronically signed 01/19/2024 Technical component performed at [facility], [address] [place], [place] [address].  Professional component performed at [employer]. [facility], [address] [place], [place] [address].  Immunohistochemistry Technical component (if applicable) was performed at [facility] [address], [place], [place] [address].   IMMUNOHISTOCHEMISTRY DISCLAIMER (if applicable): Some of these immunohistochemical stains may have been developed and the performance characteristics determine by  [facility]. Some may not have been cleared or approved by the U.S. Food and Drug Administration. The FDA has determined that such clearance or approval is not necessary. This test is used for clinical purposes. It should not be regarded as investigational or for research. This laboratory is certified under the Clinical Laboratory Improvement Amendments of 1988 (CLIA-88) as qualified to perform high complexity clinical laboratory testing.  The controls stained appropriately.   IHC stains are performed on formalin fixed, paraffin embedded tissue using a 3,3diaminobenzidine (DAB) chromogen and Leica Bond Autostainer System. The staining intensity  of the nucleus is score manually and is reported as the percentage of tumor cell nuclei demonstrating specific nuclear staining. The specimens are fixed in 10% Neutral Formalin for at least 6 hours and up to 72hrs. These tests are validated on decalcified tissue. Results should be interpreted with caution given the possibility of false negative results on decalcified specimens. Antibody Clones are as follows ER-clone 34F, PR-clone 16, Ki67- clone MM1. Some of these immunohistochemical stains may have been developed and the performance characteristics determined by [facility].  ADDENDUM:  Mismatch Repair Protein (IHC)  SUMMARY INTERPRETATION: NORMAL  There is preserved expression of the major MMR proteins. There is a very low probability that microsatellite instability (MSI) is present. However, certain clinically significant MMR protein mutations may result in preservation of nuclear expression. It is recommended that the preservation of protein expression be correlated with molecular based MSI testing.  IHC EXPRESSION RESULTS TEST           RESULT MLH1:          Preserved nuclear expression MSH2:          Preserved nuclear expression MSH6:          Preserved nuclear expression PMS2:          Preserved nuclear  expression  References: 1. Guidelines on Genetic Evaluation and Management of Lynch Syndrome: A Consensus Statement by the [place]  Multi-Society Task Force on Colorectal Cancer Gwenn HERO. Louvenia , MD, and other . Am JINNY Bruns 2014; [phone]; doi: 10.1038/ajg.2014.186; published online 27 November 2012 2. Outcomes of screening endometrial cancer patients for Lynch syndrome by patient-administered checklist. Blondie MS, and others. Gynecol Oncol 2013;131(3):619-623. 3. Muir-Torre syndrome (MTS): An update and approach to diagnosis and management. Jenkins EMERSON Rush, MD and others. J Am Acad Dermatol [phone] Some of these immunohistochemical stains may have been developed and the performance characteristics determined by [facility]. Some may not have been cleared or approved by the U.S. Food and Drug Administration. The FDA has determined that such clearance or approval is not necessary. This test is used for clinical purposes. It should not be regarded as investigational or for research. This laboratory is certified under the Clinical Laboratory Improvement Amendments of 1988 (CLIA-88) as qualified to perform high complexity clinical laboratory testing. Testing performed: [facility] [address], [address], [place] , [place] [address]          Addendum #1 performed by Pepper Dutton, MD.   Electronically signed 01/22/2024 Technical component performed at [facility], [address] [place], [place] [address].  Professional component performed at [employer]. [facility], [address], [place], [place] [address].  Immunohistochemistry Technical component (if applicable) was performed at [facility]. [address], [place], [place] [address].   IMMUNOHISTOCHEMISTRY DISCLAIMER (if applicable): Some of these immunohistochemical stains may have been developed and the performance characteristics determine  by [facility]. Some may not have been cleared or approved by the U.S. Food and Drug Administration. The FDA has determined that such clearance or approval is not necessary. This test is used for clinical purposes. It should not be regarded as investigational or for research. This laboratory is certified under the Clinical Laboratory Improvement Amendments of 1988 (CLIA-88) as qualified to perform high complexity clinical laboratory testing.  The controls stained appropriately.   IHC stains are performed on formalin fixed, paraffin embedded tissue using a 3,3diaminobenzidine (DAB) chromogen and Leica Bond Autostainer System. The staining intensity  of the nucleus is score manually and is reported as the percentage of tumor cell nuclei demonstrating specific nuclear staining. The specimens are fixed in 10% Neutral Formalin for at least 6 hours and up to 72hrs. These tests are validated on decalcified tissue. Results should be interpreted with caution given the possibility of false negative results on decalcified specimens. Antibody Clones are as follows ER-clone 81F, PR-clone 16, Ki67- clone MM1. Some of these immunohistochemical stains may have been developed and the performance characteristics determined by [facility].  ADDENDUM:  Mismatch Repair Protein (IHC)  SUMMARY INTERPRETATION: NORMAL  There is preserved expression of the major MMR proteins. There is a very low probability that microsatellite instability (MSI) is present. However, certain clinically significant MMR protein mutations may result in preservation of nuclear expression. It is recommended that the preservation of protein expression be correlated with molecular based MSI testing.  IHC EXPRESSION RESULTS TEST           RESULT MLH1:          Preserved nuclear expression MSH2:          Preserved nuclear expression MSH6:          Preserved nuclear expression PMS2:          Preserved nuclear  expression  References: 1. Guidelines on Genetic Evaluation and Management of Lynch Syndrome: A Consensus Statement by the US  Multi-Society Task Force on Colorectal Cancer Gwenn HERO. Louvenia , MD, and other . Am JINNY Bruns 2014; [phone]; doi: 10.1038/ajg.2014.186; published online 27 November 2012 2. Outcomes of screening endometrial cancer patients for Lynch syndrome by patient-administered checklist. Blondie MS, and others. Gynecol Oncol 2013;131(3):619-623. 3. Muir-Torre syndrome (MTS): An update and approach to diagnosis and management. Jenkins EMERSON Rush, MD and others. J Am Acad Dermatol [phone] Some of these immunohistochemical stains may have been developed and the performance characteristics determined by [facility]. Some may not have been cleared or approved by the U.S. Food and Drug Administration. The FDA has determined that such clearance or approval is not necessary. This test is used for clinical purposes. It should not be regarded as investigational or for research. This laboratory is certified under the Clinical Laboratory Improvement Amendments of 1988 (CLIA-88) as qualified to perform high complexity clinical laboratory testing. Testing performed: [facility] [address], [place] , [place] [address]          Addendum #2 performed by Pepper Dutton, MD.   Electronically signed 01/22/2024 Technical component performed at [facility], [address] [place], [place] [address].  Professional component performed at [employer]. [facility], [address], [place], [place] [address].  Immunohistochemistry Technical component (if applicable) was performed at [facility]. [address], [place], [place] [address].   IMMUNOHISTOCHEMISTRY DISCLAIMER (if applicable): Some of these immunohistochemical stains may have been developed and the performance characteristics determine  by [facility]. Some may not have been cleared or approved by the U.S. Food and Drug Administration. The FDA has determined that such clearance or approval is not necessary. This test is used for clinical purposes. It should not be regarded as investigational or for research. This laboratory is certified under the Clinical Laboratory Improvement Amendments of 1988 (CLIA-88) as qualified to perform high complexity clinical laboratory testing.  The controls stained appropriately.   IHC stains are performed on formalin fixed, paraffin embedded tissue using a 3,3diaminobenzidine (DAB) chromogen and Leica Bond Autostainer System. The staining intensity  of the nucleus is score manually and is reported as the percentage of tumor cell nuclei demonstrating specific nuclear staining. The specimens are fixed in 10% Neutral Formalin for at least 6 hours and up to 72hrs. These tests are validated on decalcified tissue. Results should be interpreted with caution given the possibility of false negative results on decalcified specimens. Antibody Clones are as follows ER-clone 43F, PR-clone 16, Ki67- clone MM1. Some of these immunohistochemical stains may have been developed and the performance characteristics determined by [facility].  ADDENDUM:  The lymph nodes were reviewed in expert consultation with the confirmed finding of extensive reactive follicular hyperplasia, confirmed by immunohistochemical stains on 2 separate blocks (A19 and A22).  The majority of the lymph nodes consist of nodules of CD20 positive B cells that show reactive germinal centers with polarization and tingible body macrophages.  They are also positive for BCL6 and CD10 and appropriately negative for Bcl-2.  There is a well-developed follicular dendritic cell meshwork (CD23.  Ki-67 likewise highlights the polarized germinal centers with appropriate high proliferation rate.  Cyclin D1 is appropriately  negative in the lymphocytes.  CD3 and CD5 highlight background small T cells.  In addition to the follicular hyperplasia there is progressive transformation of the germinal centers.  This finding is typically associated with the aforementioned follicular hyperplasia and follows a reactive stimulus.  It can also be seen in association with immune and autoimmune conditions.  There is an association with nodular lymphocyte predominant Hodgkin's lymphoma and a very small subset of patients (not identified on current specimen       Addendum #3 performed by Mark LeGolvan DO.   Electronically signed 01/23/2024 Technical and / or Professional components performed at [facility], [address] [place], [place] [address].  Immunohistochemistry Technical component (if applicable) was performed at [facility]. [address], [address], [place], [place] [address].   IMMUNOHISTOCHEMISTRY DISCLAIMER (if applicable): Some of these immunohistochemical stains may have been developed and the performance characteristics determine by [facility]. Some may not have been cleared or approved by the U.S. Food and Drug Administration. The FDA has determined that such clearance or approval is not necessary. This test is used for clinical purposes. It should not be regarded as investigational or for research. This laboratory is certified under the Clinical Laboratory Improvement Amendments of 1988 (CLIA-88) as qualified to perform high complexity clinical laboratory testing.  The controls stained appropriately.   IHC stains are performed on formalin fixed, paraffin embedded tissue using a 3,3diaminobenzidine (DAB) chromogen and Leica Bond Autostainer System. The staining intensity of the nucleus is score manually and is reported as the percentage of tumor cell nuclei demonstrating specific nuclear staining. The specimens are fixed in 10% Neutral Formalin for  at least 6 hours and up to 72hrs. These tests are validated on decalcified tissue. Results should be interpreted with caution given the possibility of false negative results on decalcified specimens. Antibody Clones are as follows ER-clone 43F, PR-clone 16, Ki67- clone MM1. Some of these immunohistochemical stains may have been developed and the performance characteristics determined by [facility].   Results for orders placed or performed during the hospital encounter of 01/17/24 (from the past 72 hours)  Glucose, capillary     Status: Abnormal   Collection Time: 01/21/24  4:57 PM  Result Value Ref Range   Glucose-Capillary 213 (H) 70 - 99 mg/dL    Comment: Glucose reference range applies only to samples taken after  fasting for at least 8 hours.  Glucose, capillary     Status: Abnormal   Collection Time: 01/21/24  9:15 PM  Result Value Ref Range   Glucose-Capillary 172 (H) 70 - 99 mg/dL    Comment: Glucose reference range applies only to samples taken after fasting for at least 8 hours.  Potassium     Status: None   Collection Time: 01/22/24  4:13 AM  Result Value Ref Range   Potassium 3.8 3.5 - 5.1 mmol/L    Comment: Performed at [facility], [address] [place], [place] [address]  Creatinine, serum     Status: Abnormal   Collection Time: 01/22/24  4:13 AM  Result Value Ref Range   Creatinine, Ser 2.39 (H) 0.61 - 1.24 mg/dL   GFR, Estimated 27 (L) >60 mL/min    Comment: (NOTE) Calculated using the CKD-EPI Creatinine Equation (2021) Performed at [facility], [address], [place], [place] [address]   Glucose, capillary     Status: Abnormal   Collection Time: 01/22/24  7:34 AM  Result Value Ref Range   Glucose-Capillary 114 (H) 70 - 99 mg/dL    Comment: Glucose reference range applies only to samples taken after fasting for at least 8 hours.  Glucose, capillary     Status: Abnormal   Collection Time: 01/22/24 11:15 AM   Result Value Ref Range   Glucose-Capillary 431 (H) 70 - 99 mg/dL    Comment: Glucose reference range applies only to samples taken after fasting for at least 8 hours.  Glucose, random     Status: Abnormal   Collection Time: 01/22/24 12:20 PM  Result Value Ref Range   Glucose, Bld 304 (H) 70 - 99 mg/dL    Comment: Glucose reference range applies only to samples taken after fasting for at least 8 hours. Performed at [facility], [address], [place], [place] [address]   Glucose, capillary     Status: Abnormal   Collection Time: 01/22/24  1:31 PM  Result Value Ref Range   Glucose-Capillary 302 (H) 70 - 99 mg/dL    Comment: Glucose reference range applies only to samples taken after fasting for at least 8 hours.  Glucose, capillary     Status: Abnormal   Collection Time: 01/22/24  4:12 PM  Result Value Ref Range   Glucose-Capillary 40 (LL) 70 - 99 mg/dL    Comment: Glucose reference range applies only to samples taken after fasting for at least 8 hours.   Comment 1 Notify RN   Glucose, capillary     Status: Abnormal   Collection Time: 01/22/24  4:13 PM  Result Value Ref Range   Glucose-Capillary 47 (L) 70 - 99 mg/dL    Comment: Glucose reference range applies only to samples taken after fasting for at least 8 hours.  Glucose, capillary     Status: Abnormal   Collection Time: 01/22/24  4:54 PM  Result Value Ref Range   Glucose-Capillary 47 (L) 70 - 99 mg/dL    Comment: Glucose reference range applies only to samples taken after fasting for at least 8 hours.  Glucose, capillary     Status: Abnormal   Collection Time: 01/22/24  5:23 PM  Result Value Ref Range   Glucose-Capillary 69 (L) 70 - 99 mg/dL    Comment: Glucose reference range applies only to samples taken after fasting for at least 8 hours.  Glucose, capillary     Status: None  Collection Time: 01/22/24  6:13 PM  Result Value Ref Range   Glucose-Capillary 88 70 - 99 mg/dL    Comment: Glucose  reference range applies only to samples taken after fasting for at least 8 hours.  Glucose, capillary     Status: Abnormal   Collection Time: 01/22/24  9:18 PM  Result Value Ref Range   Glucose-Capillary 131 (H) 70 - 99 mg/dL    Comment: Glucose reference range applies only to samples taken after fasting for at least 8 hours.  Potassium     Status: None   Collection Time: 01/23/24  4:27 AM  Result Value Ref Range   Potassium 4.0 3.5 - 5.1 mmol/L    Comment: Performed at [facility], [address] [place], [place] [address]  Creatinine, serum     Status: Abnormal   Collection Time: 01/23/24  4:27 AM  Result Value Ref Range   Creatinine, Ser 2.75 (H) 0.61 - 1.24 mg/dL   GFR, Estimated 23 (L) >60 mL/min    Comment: (NOTE) Calculated using the CKD-EPI Creatinine Equation (2021) Performed at [facility], [address], [place], [place] [address]   Glucose, capillary     Status: Abnormal   Collection Time: 01/23/24  8:17 AM  Result Value Ref Range   Glucose-Capillary 133 (H) 70 - 99 mg/dL    Comment: Glucose reference range applies only to samples taken after fasting for at least 8 hours.  Glucose, capillary     Status: Abnormal   Collection Time: 01/23/24 11:38 AM  Result Value Ref Range   Glucose-Capillary 243 (H) 70 - 99 mg/dL    Comment: Glucose reference range applies only to samples taken after fasting for at least 8 hours.  Glucose, capillary     Status: Abnormal   Collection Time: 01/23/24  4:28 PM  Result Value Ref Range   Glucose-Capillary 283 (H) 70 - 99 mg/dL    Comment: Glucose reference range applies only to samples taken after fasting for at least 8 hours.  Glucose, capillary     Status: Abnormal   Collection Time: 01/23/24  9:34 PM  Result Value Ref Range   Glucose-Capillary 236 (H) 70 - 99 mg/dL    Comment: Glucose reference range applies only to samples taken after fasting for at least 8 hours.  Creatinine, serum      Status: Abnormal   Collection Time: 01/24/24  4:52 AM  Result Value Ref Range   Creatinine, Ser 2.57 (H) 0.61 - 1.24 mg/dL   GFR, Estimated 25 (L) >60 mL/min    Comment: (NOTE) Calculated using the CKD-EPI Creatinine Equation (2021) Performed at [facility], [address], [place], [place] [address]   Potassium     Status: None   Collection Time: 01/24/24  4:52 AM  Result Value Ref Range   Potassium 3.7 3.5 - 5.1 mmol/L    Comment: Performed at [facility], [address] [place], [place] [address]  Glucose, capillary     Status: None   Collection Time: 01/24/24  7:59 AM  Result Value Ref Range   Glucose-Capillary 77 70 - 99 mg/dL    Comment: Glucose reference range applies only to samples taken after fasting for at least 8 hours.  Basic metabolic panel     Status: Abnormal   Collection Time: 01/24/24  8:24 AM  Result Value Ref Range   Sodium 136 135 - 145 mmol/L   Potassium 3.8 3.5 - 5.1  mmol/L   Chloride 99 98 - 111 mmol/L   CO2 24 22 - 32 mmol/L   Glucose, Bld 78 70 - 99 mg/dL    Comment: Glucose reference range applies only to samples taken after fasting for at least 8 hours.   BUN 40 (H) 8 - 23 mg/dL   Creatinine, Ser 7.39 (H) 0.61 - 1.24 mg/dL   Calcium  9.4 8.9 - 10.3 mg/dL   GFR, Estimated 25 (L) >60 mL/min    Comment: (NOTE) Calculated using the CKD-EPI Creatinine Equation (2021)    Anion gap 13 5 - 15    Comment: Performed at [facility], [address] [place], [place] [address]  Glucose, capillary     Status: None   Collection Time: 01/24/24  8:39 AM  Result Value Ref Range   Glucose-Capillary 99 70 - 99 mg/dL    Comment: Glucose reference range applies only to samples taken after fasting for at least 8 hours.  Glucose, capillary     Status: Abnormal   Collection Time: 01/24/24 12:18 PM  Result Value Ref Range   Glucose-Capillary 235 (H) 70 - 99 mg/dL    Comment: Glucose reference range applies  only to samples taken after fasting for at least 8 hours.    No results found.  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   Prostate CA (HCC)    Prostate CA (HCC)    Sepsis (HCC) 11/27/2020   Stroke [facility])     Past Surgical History:  Procedure Laterality Date   AMPUTATION Right 12/04/2020   Procedure: RIGHT BELOW KNEE AMPUTATION;  Surgeon: [name], MD;  Location: [facility] OR;  Service: Orthopedics;  Laterality: Right;   APPENDECTOMY     APPLICATION OF WOUND VAC Right 12/04/2020   Procedure: APPLICATION OF WOUND VAC;  Surgeon: [name], MD;  Location: [facility] OR;  Service: Orthopedics;  Laterality: Right;   FLEXIBLE SIGMOIDOSCOPY N/A 01/17/2024   Procedure: KINGSTON SIDE;  Surgeon: [name], MD;  Location: [facility] ORS;  Service: General;  Laterality: N/A;   MINOR HEMORRHOIDECTOMY      Social History   Socioeconomic History   Marital status: Married    Spouse name: Not on file   Number of children: 6   Years of education: Not on file   Highest education level: Not on file  Occupational History   Not on file  Tobacco Use   Smoking status: Former    Types: Cigarettes   Smokeless tobacco: Never  Vaping Use   Vaping status: Never Used  Substance and Sexual Activity   Alcohol use: Never   Drug use: Never   Sexual activity: Not Currently  Other Topics Concern   Not on file  Social History Narrative   ** Merged History Encounter ** Originally from [place].     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      Lived in [place]   [phone].  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   Right handed   Caffeine-none   Social Drivers of Health   Financial Resource Strain: Not on file  Food Insecurity: Food Insecurity Present (01/17/2024)   Hunger Vital Sign    Worried About Running Out of Food in the Last Year: Sometimes true    Ran Out of Food in the Last Year: Sometimes true  Transportation Needs: No Transportation Needs (01/17/2024)   PRAPARE - Administrator, Civil Service (Medical): No    Lack of Transportation (Non-Medical): No  Physical Activity: Not on file  Stress: Not on file  Social Connections: Moderately Integrated (01/17/2024)   Social Connection and Isolation Panel    Frequency of Communication with Friends and Family: Once a week    Frequency of Social Gatherings with Friends and Family: Once a week    Attends Religious Services: 1 to 4 times per year    Active Member of [employer] or Organizations: No    Attends Engineer, structural: 1 to 4 times per year    Marital Status: Married  Recent Concern: Social Connections - Moderately Isolated (10/19/2023)   Social Connection and Isolation Panel    Frequency of Communication with Friends and Family: Once a week    Frequency of Social Gatherings with Friends and Family: Once a week    Attends Religious Services: 1 to 4 times per year    Active Member of [employer] or Organizations: No    Attends Engineer, structural: 1 to 4 times per year    Marital Status: Never married  Intimate Partner Violence: Not At Risk (01/17/2024)   Humiliation, Afraid, Rape, and Kick questionnaire    Fear of Current or Ex-Partner: No    Emotionally Abused: No    Physically Abused: No    Sexually Abused: No    Family History  Problem Relation Age of Onset   Diabetes Mother    Hypertension Mother    Diabetes Father    Hypertension Father    Cancer Sister 59       breast cancer    Current Facility-Administered Medications   Medication Dose Route Frequency Provider Last Rate Last Admin   0.9 %  sodium chloride  infusion  250 mL Intravenous PRN [name], MD       acetaminophen  (TYLENOL ) tablet 1,000 mg  1,000 mg Oral Q6H Romir Klimowicz, MD   1,000 mg at 01/22/24 1734   alum & mag hydroxide-simeth (MAALOX/MYLANTA) 200-200-20 MG/5ML suspension 30 mL  30 mL Oral Q6H PRN [name], MD       apixaban  (ELIQUIS ) tablet 5 mg  5 mg Oral BID [name], MD   5 mg at 01/24/24 1017   atorvastatin  (LIPITOR) tablet 20 mg  20 mg Oral QHS Jamian Andujo, MD   20 mg at 01/23/24 2223   carvedilol  (COREG ) tablet 25 mg  25 mg Oral BID WC [name], MD   25 mg at 01/24/24 1017   diphenhydrAMINE  (BENADRYL ) 12.5 MG/5ML elixir 12.5 mg  12.5 mg Oral Q6H PRN [name], MD       Or   diphenhydrAMINE  (BENADRYL ) injection 12.5 mg  12.5 mg Intravenous Q6H PRN Barrington Worley, Standing, MD       feeding supplement (GLUCERNA SHAKE) (GLUCERNA SHAKE) liquid 237 mL  237 mL Oral BID BM [name], MD   237 mL at 01/23/24 1020   gabapentin  (NEURONTIN ) capsule 200 mg  200  mg Oral QHS Laelynn Blizzard, MD   200 mg at 01/23/24 2223   hydrALAZINE  (APRESOLINE ) injection 10 mg  10 mg Intravenous Q2H PRN [name], MD       HYDROmorphone  (DILAUDID ) injection 0.5-2 mg  0.5-2 mg Intravenous Q4H PRN [name], MD   1 mg at 01/18/24 0404   insulin  aspart (novoLOG ) injection 0-9 Units  0-9 Units Subcutaneous TID PC & HS Patsy Lenis, MD   3 Units at 01/23/24 2226   insulin  aspart (novoLOG ) injection 4 Units  4 Units Subcutaneous TID PC Girguis, David, MD   4 Units at 01/23/24 1800   insulin  glargine (LANTUS ) injection 16 Units  16 Units Subcutaneous Daily Regalado, Belkys A, MD       loperamide  (IMODIUM ) capsule 2 mg  2 mg Oral QHS Jullie Arps, MD   2 mg at 01/23/24 2223   loperamide  (IMODIUM ) capsule 2 mg  2 mg Oral Q12H PRN [name], MD       magic mouthwash  15 mL Oral QID PRN [name], MD       melatonin tablet 3 mg  3 mg Oral QHS  PRN [name], MD   3 mg at 01/18/24 2114   menthol -cetylpyridinium (CEPACOL) lozenge 3 mg  1 lozenge Oral PRN [name], MD       metoprolol  tartrate (LOPRESSOR ) injection 5 mg  5 mg Intravenous Q6H PRN [name], MD       metoprolol  tartrate (LOPRESSOR ) injection 5 mg  5 mg Intravenous Q2H PRN [name], MD       naphazoline-glycerin  (CLEAR EYES REDNESS) ophth solution 1-2 drop  1-2 drop Both Eyes QID PRN [name], MD       ondansetron  (ZOFRAN ) tablet 4 mg  4 mg Oral Q6H PRN [name], MD       Or   ondansetron  (ZOFRAN ) injection 4 mg  4 mg Intravenous Q6H PRN [name], MD   4 mg at 01/18/24 0403   pantoprazole  (PROTONIX ) EC tablet 40 mg  40 mg Oral Daily [name], MD   40 mg at 01/24/24 1017   phenol (CHLORASEPTIC) mouth spray 2 spray  2 spray Mouth/Throat PRN [name], MD       polycarbophil (FIBERCON) tablet 625 mg  625 mg Oral BID [name], MD   625 mg at 01/24/24 1017   prochlorperazine  (COMPAZINE ) tablet 10 mg  10 mg Oral Q6H PRN [name], MD       Or   prochlorperazine  (COMPAZINE ) injection 5-10 mg  5-10 mg Intravenous Q6H PRN [name], MD       sertraline  (ZOLOFT ) tablet 50 mg  50 mg Oral Daily [name], MD   50 mg at 01/24/24 1017   simethicone  (MYLICON) chewable tablet 40 mg  40 mg Oral Q6H PRN [name], MD       sodium chloride  (OCEAN) 0.65 % nasal spray 1-2 spray  1-2 spray Each Nare Q6H PRN [name], MD       sodium chloride  flush (NS) 0.9 % injection 3 mL  3 mL Intravenous Q12H Zakeya Junker, MD   3 mL at 01/24/24 1018   sodium chloride  flush (NS) 0.9 % injection 3 mL  3 mL Intravenous PRN [name], MD       tamsulosin  (FLOMAX ) capsule 0.4 mg  0.4 mg Oral Daily [name], MD   0.4 mg at 01/24/24 1016   torsemide  (DEMADEX ) tablet 20 mg  20 mg Oral Daily Jarrius Huaracha,  Elspeth, MD   20 mg at 01/24/24 1017   traMADol  (ULTRAM ) tablet 50-100 mg  50-100 mg Oral Q12H PRN Sheldon Elspeth, MD   100 mg at 01/19/24 2144    Vitamin D  (Ergocalciferol ) (DRISDOL ) 1.25 MG (50000 UNIT) capsule 50,000 Units  50,000 Units Oral Weekly Sheldon Elspeth, MD   50,000 Units at 01/21/24 9071     Allergies  Allergen Reactions   Fish Allergy Other (See Comments)    No un scaled fish!!   Pork-Derived Products Other (See Comments)    Will not eat pork and religious reasons; ok to receive Lovenox    Shellfish Allergy Other (See Comments)    Will not eat shellfish   Pumpkin Seed Oil Rash    Signed:   Elspeth [name], MD, FACS, MASCRS Esophageal, Gastrointestinal & Colorectal Surgery Robotic and Minimally Invasive Surgery  [facility] [address] [place], [place] [address] [phone] Fax [phone] [address]  CONTACT INFORMATION: Weekday (9AM-5PM): Call [facility] main office at [phone] Weeknight (5PM-9AM) or Weekend/Holiday: Check EPIC Web Links tab & use AMION (password  [email or web address]) for General Surgery [facility] coverage  Please, DO NOT use SecureChat  (it is not reliable communication to reach operating surgeons & will lead to a delay in care).   Epic staff messaging available for outptient concerns needing 1-2 business day response.      01/24/2024, 12:36 PM

## 2024-01-19 NOTE — Progress Notes (Signed)
 Occupational Therapy Treatment Patient Details Name: Cory [name] MRN: [id number] DOB: [birth year] Today's Date: 01/19/2024   History of present illness Cory [name] is a 78 year old male with robotic sigmoid colectomy with anastomosis 2* colon CA PMH: R BKA,  A-flutter, DM II, recent incidental CVA June 2025, CKD 3B, HTN, HLD, IDA.   OT comments  Patient was seen for skilled OT session this am. See below for current functional status and treatment. Patient open to all therapy presented. Reports of incontinence and intermittent confusion from medical team and limited home support concerns thus patient will benefit from continued inpatient follow up therapy, <3 hours/day. Patient requires continued Acute care hospital level OT services to progress safety and functional performance and allow for discharge.        If plan is discharge home, recommend the following:  A little help with walking and/or transfers;A little help with bathing/dressing/bathroom;Assistance with cooking/housework;Assist for transportation;Help with stairs or ramp for entrance   Equipment Recommendations  None recommended by OT       Precautions / Restrictions Precautions Precautions: Fall Recall of Precautions/Restrictions: Intact Restrictions Weight Bearing Restrictions Per Provider Order: No       Mobility Bed Mobility Overal bed mobility: Needs Assistance Bed Mobility: Supine to Sit     Supine to sit: Supervision     General bed mobility comments: for safety with use of hospital bed, S and set up for prosthesis, pt able to don sitting EOB, unable to don standard shoe on L    Transfers Overall transfer level: Needs assistance Equipment used: Rolling walker (2 wheels) Transfers: Sit to/from Stand Sit to Stand: Contact guard assist           General transfer comment: min cues for RW placement and safety with bed, recliner and commode transfers     Balance Overall balance assessment: Needs  assistance Sitting-balance support: No upper extremity supported, Feet unsupported Sitting balance-Leahy Scale: Good     Standing balance support: No upper extremity supported, Reliant on assistive device for balance, During functional activity Standing balance-Leahy Scale: Fair Standing balance comment: with R prosthetic in place                           ADL either performed or assessed with clinical judgement   ADL Overall ADL's : Needs assistance/impaired Eating/Feeding: Independent;Sitting   Grooming: Wash/dry hands;Wash/dry face;Sitting;Set up   Upper Body Bathing: Set up;Sitting   Lower Body Bathing: Minimal assistance;Sit to/from stand   Upper Body Dressing : Set up;Sitting   Lower Body Dressing: Minimal assistance;Sit to/from stand Lower Body Dressing Details (indicate cue type and reason): including R Le prosthesis donning, has reacher and demo'd strategies for use for L LE garment s Toilet Transfer: Contact guard assist;Regular Toilet;Grab bars;Rolling walker (2 wheels)   Toileting- Clothing Manipulation and Hygiene: Minimal assistance;Sit to/from stand       Functional mobility during ADLs: Rolling walker (2 wheels);Contact guard assist General ADL Comments: reinforced reacher use for falls prevention for LB garment management    Extremity/Trunk Assessment Upper Extremity Assessment Upper Extremity Assessment: Right hand dominant;Overall [facility] for tasks assessed   Lower Extremity Assessment Lower Extremity Assessment: Defer to PT evaluation        Vision       Perception     Praxis     Communication Communication Communication: No apparent difficulties Factors Affecting Communication: Hearing impaired (mild)   Cognition Arousal: Alert Behavior During Therapy: [facility] for tasks  assessed/performed Cognition: Cognition impaired   Orientation impairments:  (A Ox4) Awareness: Online awareness impaired Memory impairment (select all impairments):  Short-term memory Attention impairment (select first level of impairment): Selective attention Executive functioning impairment (select all impairments): Problem solving OT - Cognition Comments: higher level processing delays noted this visit                 Following commands: Intact        Cueing   Cueing Techniques: Verbal cues        General Comments no skin issues other than abdominal incision intact    Pertinent Vitals/ Pain       Pain Assessment Pain Assessment: Faces Faces Pain Scale: Hurts a little bit Pain Location: abdomen, L flank Pain Descriptors / Indicators: Aching, Discomfort Pain Intervention(s): Monitored during session   Frequency  Min 2X/week        Progress Toward Goals  OT Goals(current goals can now be found in the care plan section)  Progress towards OT goals: Progressing toward goals  Acute Rehab OT Goals Patient Stated Goal: to walk OT Goal Formulation: With patient Time For Goal Achievement: 02/01/24 Potential to Achieve Goals: Good ADL Goals Pt Will Perform Lower Body Bathing: with set-up;sit to/from stand Pt Will Perform Lower Body Dressing: with supervision;with adaptive equipment;sit to/from stand Pt Will Transfer to Toilet: with supervision;ambulating;regular height toilet;grab bars Pt Will Perform Toileting - Clothing Manipulation and hygiene: with supervision;sit to/from stand  Plan      Co-evaluation      Reason for Co-Treatment: To address functional/ADL transfers PT goals addressed during session: Mobility/safety with mobility OT goals addressed during session: ADL's and self-care      AM-PAC OT 6 Clicks Daily Activity     Outcome Measure   Help from another person eating meals?: None Help from another person taking care of personal grooming?: A Little Help from another person toileting, which includes using toliet, bedpan, or urinal?: A Little Help from another person bathing (including washing, rinsing,  drying)?: A Little Help from another person to put on and taking off regular upper body clothing?: A Little Help from another person to put on and taking off regular lower body clothing?: A Little 6 Click Score: 19    End of Session Equipment Utilized During Treatment: Gait belt;Rolling walker (2 wheels)  OT Visit Diagnosis: Unsteadiness on feet (R26.81)   Activity Tolerance Patient tolerated treatment well   Patient Left in chair;with call bell/phone within reach;with chair alarm set   Nurse Communication Mobility status;Other (comment)        Time: 1020-1050 OT Time Calculation (min): 30 min  Charges: OT General Charges $OT Visit: 1 Visit OT Treatments $Self Care/Home Management : 8-22 mins  Jeremaine Maraj OT/L Acute Rehabilitation Department  [phone]  01/19/2024, 11:27 AM

## 2024-01-19 NOTE — Plan of Care (Signed)
  Problem: Education: Goal: Understanding of discharge needs will improve Outcome: Progressing   Problem: Nutritional: Goal: Will attain and maintain optimal nutritional status will improve Outcome: Progressing   Problem: Skin Integrity: Goal: Risk for impaired skin integrity will decrease Outcome: Progressing   Problem: Activity: Goal: Ability to tolerate increased activity will improve Outcome: Not Progressing

## 2024-01-19 NOTE — Care Management Important Message (Signed)
 Important Message  Patient Details IM Letter given to the Patient Name: Cory [name] MRN: [id number] Date of Birth: [birth year]   Important Message Given:  Yes - Medicare IM     [name] 01/19/2024, 9:34 AM

## 2024-01-19 NOTE — Progress Notes (Signed)
 Physical Therapy Treatment Patient Details Name: Cedrick Partain MRN: [id number] DOB: 1946/04/06 Today's Date: 01/19/2024   History of Present Illness Ezariah Nace is a 78 year old male with robotic sigmoid colectomy with anastomosis 2* colon CA PMH: R BKA,  A-flutter, DM II, recent incidental CVA June 2025, CKD 3B, HTN, HLD, IDA.    PT Comments   Pt admitted with above diagnosis.  Pt currently with functional limitations due to the deficits listed below (see PT Problem List). Pt resting in bed when therapist arrived. Pt agreeable to therapy intervention. Pt is S for bed mobility with use of hospital bed and min cues for abdominal precautions, pt required CGA and min cues for transfers with RW, gait tasks in hallway with RW, CGA, recliner close, R prosthesis donned and L foot drop 500 feet with cues and one standing therapeutic rest break. Pt has limited family support and PAC x 1 wk with pt exhibiting difficulty with managing B and B incontinence. Pt left seated in recliner and all needs in place.  Patient will benefit from continued inpatient follow up therapy, <3 hours/day. Pt will benefit from acute skilled PT to increase their independence and safety with mobility to allow discharge.      If plan is discharge home, recommend the following: Help with stairs or ramp for entrance;Assist for transportation;A little help with walking and/or transfers;A little help with bathing/dressing/bathroom;Assistance with cooking/housework   Can travel by private vehicle     Yes  Equipment Recommendations  None recommended by PT    Recommendations for Other Services       Precautions / Restrictions Precautions Precautions: Fall Recall of Precautions/Restrictions: Intact Restrictions Weight Bearing Restrictions Per Provider Order: No     Mobility  Bed Mobility Overal bed mobility: Needs Assistance Bed Mobility: Supine to Sit     Supine to sit: Supervision     General bed mobility comments: for  safety with use of hospital bed, S and set up for prosthesis, pt able to don sitting EOB, unable to don standard shoe on L    Transfers Overall transfer level: Needs assistance Equipment used: Rolling walker (2 wheels) Transfers: Sit to/from Stand Sit to Stand: Contact guard assist           General transfer comment: min cues for RW placement and safety with bed, recliner and commode transfers    Ambulation/Gait Ambulation/Gait assistance: Contact guard assist, +2 safety/equipment (recliner follow due to steppage like gait pattern with L foot drop and R prosthesis) Gait Distance (Feet): 500 Feet (one standing therapeutic rest brake) Assistive device: Rolling walker (2 wheels) Gait Pattern/deviations: Step-through pattern, Decreased dorsiflexion - right, Decreased dorsiflexion - left, Steppage, Trunk flexed Gait velocity: decreased     General Gait Details: CGA for safety, min cues for posture, proper distance from RW and pt demonstrated one episode of poor L foot clearance   Stairs             Wheelchair Mobility     Tilt Bed    Modified Rankin (Stroke Patients Only)       Balance Overall balance assessment: Needs assistance Sitting-balance support: No upper extremity supported, Feet unsupported Sitting balance-Leahy Scale: Good     Standing balance support: No upper extremity supported, Reliant on assistive device for balance, During functional activity Standing balance-Leahy Scale: Fair Standing balance comment: with R prosthetic in place  Communication Communication Communication: No apparent difficulties Factors Affecting Communication: Hearing impaired (mild)  Cognition Arousal: Alert Behavior During Therapy: WFL for tasks assessed/performed   PT - Cognitive impairments: No apparent impairments                         Following commands: Intact      Cueing Cueing Techniques: Verbal cues   Exercises      General Comments        Pertinent Vitals/Pain Pain Assessment Pain Assessment: Faces Faces Pain Scale: Hurts a little bit Pain Location: abdomen, L flank Pain Descriptors / Indicators: Aching, Discomfort Pain Intervention(s): Monitored during session (pt declined CP)    Home Living                          Prior Function            PT Goals (current goals can now be found in the care plan section) Acute Rehab PT Goals PT Goal Formulation: With patient Time For Goal Achievement: 02/01/24 Potential to Achieve Goals: Good Progress towards PT goals: Progressing toward goals    Frequency    Min 3X/week      PT Plan      Co-evaluation PT/OT/SLP Co-Evaluation/Treatment: Yes Reason for Co-Treatment: To address functional/ADL transfers PT goals addressed during session: Mobility/safety with mobility OT goals addressed during session: ADL's and self-care      AM-PAC PT 6 Clicks Mobility   Outcome Measure  Help needed turning from your back to your side while in a flat bed without using bedrails?: A Little Help needed moving from lying on your back to sitting on the side of a flat bed without using bedrails?: A Little Help needed moving to and from a bed to a chair (including a wheelchair)?: A Little Help needed standing up from a chair using your arms (e.g., wheelchair or bedside chair)?: A Little Help needed to walk in hospital room?: A Little Help needed climbing 3-5 steps with a railing? : A Lot 6 Click Score: 17    End of Session Equipment Utilized During Treatment: Gait belt Activity Tolerance: Patient tolerated treatment well;No increased pain Patient left: with call bell/phone within reach;in chair;with chair alarm set Nurse Communication: Mobility status PT Visit Diagnosis: Other abnormalities of gait and mobility (R26.89)     Time: 1020-1053 PT Time Calculation (min) (ACUTE ONLY): 33 min  Charges:    $Gait Training:  8-22 mins PT General Charges $$ ACUTE PT VISIT: 1 Visit                     [place], PT Acute Rehab    [place] VEAR Drone 01/19/2024, 11:05 AM

## 2024-01-19 NOTE — Progress Notes (Signed)
 01/19/2024  Cory [name] [id number] 06-Apr-1946  CARE TEAM: PCP: Cory Lamar Cory [name], MD  Outpatient Care Team: Patient Care Team: Cory Lamar Cory [name], MD as PCP - General (Family Medicine) Cory Lurena POUR, MD as PCP - Cardiology (Cardiology) Cory Norleen [name], MD as Referring Physician (Cardiology) Cory Grayson, MD as Referring Physician (Geriatric Medicine) Cory [name], [name], NP as Nurse Practitioner (Nurse Practitioner) Cory Callander, MD as Consulting Physician (Hematology and Oncology) Cory Anes, MD as Referring Physician (Surgical Oncology) [name], [name], MD as Consulting Physician (Cardiology) Cory Lamar Cory [name], MD (Family Medicine) Cory Standing, MD as Consulting Physician (General Surgery) Cory Isles, MD as Consulting Physician (Neurology) [name], [name], FNP as WOC Nurse (Nurse Practitioner) [name], [name], FNP as WOC Nurse (Nurse Practitioner)  Inpatient Treatment Team: Treatment Team:  Cory Standing, MD Patsy Lenis, MD [name], MD Sebastian Boyer, RN Cory [name], NT Crutchfield, [name], RN Cory Manuella POUR, RN   Problem List:   Principal Problem:   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Cory [name]) Active Problems:   Hyperlipidemia   Hypertension   Overweight (BMI 25.0-29.9)   Type 2 diabetes mellitus with chronic kidney disease, with long-term current use of insulin  (HCC)   Atrial flutter (HCC)   CKD (chronic kidney disease) stage 4, GFR 15-29 ml/min (HCC)   IDA (iron  deficiency anemia)   History of prostate cancer   Hx of right BKA (HCC)   HFrEF (heart failure with reduced ejection fraction) (HCC)   Depression   GERD (gastroesophageal reflux disease)   History of CVA (cerebrovascular accident)   01/17/2024  POST-OPERATIVE DIAGNOSIS:  PROXIMAL SIGMOID COLON CANCER   PROCEDURE:   -ROBOTIC SIGMOID COLECTOMY WITH ANASTOMOSIS -INTRAOPERATIVE ASSESSMENT OF TISSUE VASCULAR PERFUSION USING ICG (indocyanine green ) IMMUNOFLUORESCENCE -TRANSVERSUS  ABDOMINIS PLANE (TAP) BLOCK - BILATERAL -FLEXIBLE SIGMOIDOSCOPY   SURGEON:  [name] [name], MD  OR FINDINGS:   Patient had proximal sigmoid colon cancer flopped up into the left upper quadrant.  No obvious metastatic disease on visceral parietal peritoneum or liver.   It is a 31mm EEA anastomosis ( mid descending colon connected to proximal rectum.)  It rests 14 cm from the anal verge by flexible sigmoidoscopy  Assessment Cory [name] Stay = 2 days) 2 Days Post-Op    Relatively stable    Plan:  ERAS protocol  Follow-up in pathology.  No evidence of metastatic disease   Tolerating diet.  Stable on 3 E. colorectal surgery floor   Poorly controlled diabetes with significant hyperglycemia.  Better controlled on Lantus .  Defer to medicine but looks like current regimen working okay  Hemoglobin stable for 48 hours =  okay to resume Eliquis  anticoagulation.   -monitor electrolytes & replace as needed  Keep K>4, Mg>2, Phos>3  HTN control  -VTE prophylaxis- SCDs.  Anticoagulation prophyllaxis SQ as appropriate  -mobilize as tolerated to help recovery.  He has a BKA but ambulates well with his prosthesis.  I wrote for therapies to help evaluate him given his numerous hospitalizations and somewhat deconditioned state to see if he needs home health or even skilled nursing facility.  Enlist therapies in moderate/high risk patients as appropriate  I updated the patient's status to the patient  Recommendations were made.  Questions were answered.  He expressed understanding & appreciation.  -Disposition: Postop the patient can go to home health but he has difficulty getting to the bathroom by himself.  He lives by himself.  I think this is too much to handle at home with a very  high readmission rate.  Therefore more strongly recommended skilled nursing facility for a few weeks until he is stronger and family can help.  Once that is available he can be discharged   Disposition:  The  patient is from: Home Anticipate discharge to:  Skilled Nursing Facility (SNF) Anticipated Date of Discharge is:  September 12,2025   Barriers to discharge:  Pending Clinical improvement (more likely than not), Therapy assessment & Recommendations pending, Testing result pending, and Consultant clearance & sign off    Patient currently is NOT MEDICALLY STABLE for discharge from the hospital from a surgery standpoint.      I reviewed nursing notes, hospitalist notes, last 24 h vitals and pain scores, last 48 h intake and output, last 24 h labs and trends, and last 24 h imaging results.  I have reviewed this patient's available data, including medical history, events of note, test results, etc as part of my evaluation.   A significant portion of that time was spent in counseling. Care during the described time interval was provided by me.  This care required moderate level of medical decision making.  01/19/2024    Subjective: (Chief complaint) Transferred to floor bed on 3 E.  Improved tolerating solids.  Feels like he is having a bowel movement.  Worried about being by himself at home.  Pain controlled.  Objective:  Vital signs:  Vitals:   01/19/24 0613 01/19/24 0832 01/19/24 0835 01/19/24 1237  BP: (!) 153/91 (!) 165/108 (!) 142/91 (!) 153/96  Pulse: 96 99 90 87  Resp: 15 16  16   Temp: 98.6 F (37 C) 97.9 F (36.6 C)  98.2 F (36.8 C)  TempSrc: Oral   Oral  SpO2: 99% 98%  100%  Weight:      Height:        Last BM Date : 01/18/24  Intake/Output   Yesterday:  09/11 0701 - 09/12 0700 In: 1240 [P.O.:1240] Out: 2500 [Urine:2500] This shift:  Total I/O In: 240 [P.O.:240] Out: 350 [Urine:350]  Bowel function:  Flatus: YES  BM:  YES  Drain: (No drain)   Physical Exam:  General: Pt awake/alert in no acute distress.   Eyes: PERRL, normal EOM.  Sclera clear.  No icterus Neuro: CN II-XII intact w/o focal sensory/motor deficits. Lymph: No head/neck/groin  lymphadenopathy Psych:  No delerium/psychosis/paranoia.  Occasionally mildly confused but redirectable and at best oriented x 4 -this is his baseline HENT: Normocephalic, Mucus membranes moist.  No thrush.  Mumbling which is his baseline. Neck: Supple, No tracheal deviation.  No obvious thyromegaly Chest: No pain to chest wall compression.  Good respiratory excursion.  No audible wheezing CV:  Pulses intact.  Regular rhythm.  No major extremity edema MS: Normal AROM mjr joints.  No obvious deformity  Abdomen: Soft.  Nondistended.  Mildly tender at incisions only.  No evidence of peritonitis.  No incarcerated hernias.  Ext:  No deformity.  No mjr edema.  No cyanosis Skin: No petechiae / purpurea.  No major sores.  Warm and dry    Results:   Cultures: No results found for this or any previous visit (from the past 720 hours).  Labs: Results for orders placed or performed during the hospital encounter of 01/17/24 (from the past 48 hours)  Glucose, capillary     Status: Abnormal   Collection Time: 01/17/24  5:39 PM  Result Value Ref Range   Glucose-Capillary 250 (H) 70 - 99 mg/dL    Comment: Glucose reference range applies  only to samples taken after fasting for at least 8 hours.  Glucose, capillary     Status: Abnormal   Collection Time: 01/17/24  8:58 PM  Result Value Ref Range   Glucose-Capillary 256 (H) 70 - 99 mg/dL    Comment: Glucose reference range applies only to samples taken after fasting for at least 8 hours.  Glucose, capillary     Status: Abnormal   Collection Time: 01/17/24 10:27 PM  Result Value Ref Range   Glucose-Capillary 278 (H) 70 - 99 mg/dL    Comment: Glucose reference range applies only to samples taken after fasting for at least 8 hours.  Glucose, capillary     Status: Abnormal   Collection Time: 01/17/24 11:57 PM  Result Value Ref Range   Glucose-Capillary 258 (H) 70 - 99 mg/dL    Comment: Glucose reference range applies only to samples taken after  fasting for at least 8 hours.  Basic metabolic panel     Status: Abnormal   Collection Time: 01/18/24  3:32 AM  Result Value Ref Range   Sodium 131 (L) 135 - 145 mmol/L   Potassium 4.6 3.5 - 5.1 mmol/L   Chloride 97 (L) 98 - 111 mmol/L   CO2 20 (L) 22 - 32 mmol/L   Glucose, Bld 256 (H) 70 - 99 mg/dL    Comment: Glucose reference range applies only to samples taken after fasting for at least 8 hours.   BUN 24 (H) 8 - 23 mg/dL   Creatinine, Ser 8.15 (H) 0.61 - 1.24 mg/dL   Calcium  9.1 8.9 - 10.3 mg/dL   GFR, Estimated 38 (L) >60 mL/min    Comment: (NOTE) Calculated using the CKD-EPI Creatinine Equation (2021)    Anion gap 13 5 - 15    Comment: Performed at [facility], [address], [place], [place] [address]  CBC     Status: Abnormal   Collection Time: 01/18/24  3:32 AM  Result Value Ref Range   WBC 7.3 4.0 - 10.5 K/uL   RBC 4.03 (L) 4.22 - 5.81 MIL/uL   Hemoglobin 10.7 (L) 13.0 - 17.0 g/dL   HCT 66.1 (L) 60.9 - 47.9 %   MCV 83.9 80.0 - 100.0 fL   MCH 26.6 26.0 - 34.0 pg   MCHC 31.7 30.0 - 36.0 g/dL   RDW 85.3 88.4 - 84.4 %   Platelets 192 150 - 400 K/uL   nRBC 0.0 0.0 - 0.2 %    Comment: Performed at [name], [address] [place], [place] [address]  Magnesium      Status: None   Collection Time: 01/18/24  3:32 AM  Result Value Ref Range   Magnesium  2.1 1.7 - 2.4 mg/dL    Comment: Performed at [facility], [address], [place], [place] [address]  Prealbumin     Status: None   Collection Time: 01/18/24  3:32 AM  Result Value Ref Range   Prealbumin 19 18 - 38 mg/dL    Comment: Performed at [name], [address], [place], [place] [address]  Phosphorus     Status: None   Collection Time: 01/18/24  3:32 AM  Result Value Ref Range   Phosphorus 3.8 2.5 - 4.6 mg/dL    Comment: Performed at [name], [address], [place], [place] [address]  Glucose, capillary     Status:  Abnormal   Collection Time: 01/18/24  3:56 AM  Result Value  Ref Range   Glucose-Capillary 251 (H) 70 - 99 mg/dL    Comment: Glucose reference range applies only to samples taken after fasting for at least 8 hours.  Glucose, capillary     Status: Abnormal   Collection Time: 01/18/24  7:55 AM  Result Value Ref Range   Glucose-Capillary 211 (H) 70 - 99 mg/dL    Comment: Glucose reference range applies only to samples taken after fasting for at least 8 hours.  Glucose, capillary     Status: Abnormal   Collection Time: 01/18/24 11:39 AM  Result Value Ref Range   Glucose-Capillary 169 (H) 70 - 99 mg/dL    Comment: Glucose reference range applies only to samples taken after fasting for at least 8 hours.   Comment 1 Notify RN   Glucose, capillary     Status: Abnormal   Collection Time: 01/18/24  4:20 PM  Result Value Ref Range   Glucose-Capillary 227 (H) 70 - 99 mg/dL    Comment: Glucose reference range applies only to samples taken after fasting for at least 8 hours.  Glucose, capillary     Status: Abnormal   Collection Time: 01/18/24  8:35 PM  Result Value Ref Range   Glucose-Capillary 355 (H) 70 - 99 mg/dL    Comment: Glucose reference range applies only to samples taken after fasting for at least 8 hours.  Basic metabolic panel     Status: Abnormal   Collection Time: 01/19/24  4:31 AM  Result Value Ref Range   Sodium 133 (L) 135 - 145 mmol/L   Potassium 4.3 3.5 - 5.1 mmol/L   Chloride 99 98 - 111 mmol/L   CO2 21 (L) 22 - 32 mmol/L   Glucose, Bld 103 (H) 70 - 99 mg/dL    Comment: Glucose reference range applies only to samples taken after fasting for at least 8 hours.   BUN 28 (H) 8 - 23 mg/dL   Creatinine, Ser 7.66 (H) 0.61 - 1.24 mg/dL   Calcium  9.1 8.9 - 10.3 mg/dL   GFR, Estimated 28 (L) >60 mL/min    Comment: (NOTE) Calculated using the CKD-EPI Creatinine Equation (2021)    Anion gap 13 5 - 15    Comment: Performed at [facility], [address],  [place], [place] [address]  Hemoglobin     Status: Abnormal   Collection Time: 01/19/24  4:31 AM  Result Value Ref Range   Hemoglobin 10.2 (L) 13.0 - 17.0 g/dL    Comment: Performed at [name], [address] [place], [place] [address]  Glucose, capillary     Status: Abnormal   Collection Time: 01/19/24  8:40 AM  Result Value Ref Range   Glucose-Capillary 153 (H) 70 - 99 mg/dL    Comment: Glucose reference range applies only to samples taken after fasting for at least 8 hours.  Glucose, capillary     Status: Abnormal   Collection Time: 01/19/24 12:31 PM  Result Value Ref Range   Glucose-Capillary 346 (H) 70 - 99 mg/dL    Comment: Glucose reference range applies only to samples taken after fasting for at least 8 hours.    Imaging / Studies: No results found.  Medications / Allergies: per chart  Antibiotics: Anti-infectives (From admission, onward)    Start     Dose/Rate Route Frequency Ordered Stop   01/17/24 2200  cefoTEtan  (CEFOTAN ) 2 g in sodium chloride  0.9 % 100 mL IVPB  2 g 200 mL/hr over 30 Minutes Intravenous Every 12 hours 01/17/24 1725 01/18/24 0832   01/17/24 1400  neomycin  (MYCIFRADIN ) tablet 1,000 mg  Status:  Discontinued       Placed in And Linked Group   1,000 mg Oral 3 times per day 01/17/24 1005 01/17/24 1013   01/17/24 1400  metroNIDAZOLE  (FLAGYL ) tablet 1,000 mg  Status:  Discontinued       Placed in And Linked Group   1,000 mg Oral 3 times per day 01/17/24 1005 01/17/24 1013   01/17/24 1015  cefoTEtan  (CEFOTAN ) 2 g in sodium chloride  0.9 % 100 mL IVPB        2 g 200 mL/hr over 30 Minutes Intravenous On call to O.R. 01/17/24 1005 01/17/24 1730         Note: Portions of this report may have been transcribed using voice recognition software. Every effort was made to ensure accuracy; however, inadvertent computerized transcription errors may be present.   Any transcriptional errors that result from this process are  unintentional.    Elspeth KYM Schultze, MD, FACS, MASCRS Esophageal, Gastrointestinal & Colorectal Surgery Robotic and Minimally Invasive Surgery  [facility] [address], [address] [place], [place] [address] [phone] Fax [phone] Main  CONTACT INFORMATION: Weekday (9AM-5PM): Call [facility] main office at [phone] Weeknight (5PM-9AM) or Weekend/Holiday: Check EPIC Web Links tab & use AMION (password  [email or web address]) for General Surgery [facility] coverage  Please, DO NOT use SecureChat  (it is not reliable communication to reach operating surgeons & will lead to a delay in care).   Epic staff messaging available for outptient concerns needing 1-2 business day response.      01/19/2024  4:14 PM

## 2024-01-20 DIAGNOSIS — E1122 Type 2 diabetes mellitus with diabetic chronic kidney disease: Secondary | ICD-10-CM | POA: Diagnosis not present

## 2024-01-20 DIAGNOSIS — Z794 Long term (current) use of insulin: Secondary | ICD-10-CM | POA: Diagnosis not present

## 2024-01-20 DIAGNOSIS — I483 Typical atrial flutter: Secondary | ICD-10-CM | POA: Diagnosis not present

## 2024-01-20 DIAGNOSIS — C189 Malignant neoplasm of colon, unspecified: Secondary | ICD-10-CM | POA: Diagnosis not present

## 2024-01-20 LAB — GLUCOSE, CAPILLARY
Glucose-Capillary: 175 mg/dL — ABNORMAL HIGH (ref 70–99)
Glucose-Capillary: 131 mg/dL — ABNORMAL HIGH (ref 70–99)
Glucose-Capillary: 294 mg/dL — ABNORMAL HIGH (ref 70–99)
Glucose-Capillary: 354 mg/dL — ABNORMAL HIGH (ref 70–99)

## 2024-01-20 LAB — BASIC METABOLIC PANEL WITH GFR
Anion gap: 12 (ref 5–15)
BUN: 24 mg/dL — ABNORMAL HIGH (ref 8–23)
CO2: 23 mmol/L (ref 22–32)
Calcium: 8.9 mg/dL (ref 8.9–10.3)
Chloride: 98 mmol/L (ref 98–111)
Creatinine, Ser: 2.14 mg/dL — ABNORMAL HIGH (ref 0.61–1.24)
GFR, Estimated: 31 mL/min — ABNORMAL LOW (ref >60)
Glucose, Bld: 138 mg/dL — ABNORMAL HIGH (ref 70–99)
Potassium: 4 mmol/L (ref 3.5–5.1)
Sodium: 133 mmol/L — ABNORMAL LOW (ref 135–145)

## 2024-01-20 LAB — HEMOGLOBIN: Hemoglobin: 9 g/dL — ABNORMAL LOW (ref 13.0–17.0)

## 2024-01-20 NOTE — Progress Notes (Signed)
 01/20/2024  Cory [name] [id number] Oct 03, 1945  CARE TEAM: PCP: [name], MD  Outpatient Care Team: Patient Care Team: [name], MD as PCP - General (Family Medicine) [name], MD as PCP - Cardiology (Cardiology) [name], MD as Referring Physician (Cardiology) [name], MD as Referring Physician (Geriatric Medicine) [name], [name], NP as Nurse Practitioner (Nurse Practitioner) [name], MD as Consulting Physician (Hematology and Oncology) [name], MD as Referring Physician (Surgical Oncology) [name], [name], MD as Consulting Physician (Cardiology) [name], MD (Family Medicine) [name], MD as Consulting Physician (General Surgery) [name], MD as Consulting Physician (Neurology) [name], [name], FNP as WOC Nurse (Nurse Practitioner) [name], [name], FNP as WOC Nurse (Nurse Practitioner)  Inpatient Treatment Team: Treatment Team:  [name], MD Patsy Lenis, MD [name], MD Sebastian Boyer, RN [name], PT Perri Tinnie LABOR, NT Duwayne Angeline NOVAK, RN Sonda Manuella POUR, RN   Problem List:   Principal Problem:   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[facility]) Active Problems:   Hyperlipidemia   Hypertension   Overweight (BMI 25.0-29.9)   Type 2 diabetes mellitus with chronic kidney disease, with long-term current use of insulin  (HCC)   Atrial flutter (HCC)   CKD (chronic kidney disease) stage 4, GFR 15-29 ml/min (HCC)   IDA (iron  deficiency anemia)   History of prostate cancer   Hx of right BKA (HCC)   HFrEF (heart failure with reduced ejection fraction) (HCC)   Depression   GERD (gastroesophageal reflux disease)   History of CVA (cerebrovascular accident)   01/17/2024  POST-OPERATIVE DIAGNOSIS:  PROXIMAL SIGMOID COLON CANCER   PROCEDURE:   -ROBOTIC SIGMOID COLECTOMY WITH ANASTOMOSIS -INTRAOPERATIVE ASSESSMENT OF TISSUE VASCULAR PERFUSION USING ICG (indocyanine green )  IMMUNOFLUORESCENCE -TRANSVERSUS ABDOMINIS PLANE (TAP) BLOCK - BILATERAL -FLEXIBLE SIGMOIDOSCOPY   SURGEON:  [name], MD  OR FINDINGS:   Patient had proximal sigmoid colon cancer flopped up into the left upper quadrant.  No obvious metastatic disease on visceral parietal peritoneum or liver.   It is a 31mm EEA anastomosis ( mid descending colon connected to proximal rectum.)  It rests 14 cm from the anal verge by flexible sigmoidoscopy  Assessment [facility] Stay = 3 days) 3 Days Post-Op    Relatively stable    Plan:  ERAS protocol  Follow-up on pathology notes pT3 pN0 adenocarcinoma.  49 lymph nodes sampled negative.  Stage II disease.  Update and told the patient bedside.  I think surgery is all he needs.  Given his advanced age and numerous health issues, would not be a great candidate for post adjuvant chemotherapy or treatment anyway.  I do think it is wise for him to follow-up at the cancer center for survival pathway a colonoscopy in a year.  Hopefully he will be able to be compliant with this.   Tolerating diet.  Poorly controlled diabetes with significant hyperglycemia.  Better controlled on Lantus .  Defer to medicine but looks like current regimen working okay  Hemoglobin stable for 48 hours =  okay to resume Eliquis  anticoagulation.   -monitor electrolytes & replace as needed  Keep K>4, Mg>2, Phos>3  HTN control  -VTE prophylaxis- SCDs.  Anticoagulation prophyllaxis SQ as appropriate  -mobilize as tolerated to help recovery.  He has a BKA but ambulates well with his prosthesis.  I wrote for therapies to help evaluate him given his numerous hospitalizations and somewhat deconditioned state to see if he needs home health or even  skilled nursing facility.  Enlist therapies in moderate/high risk patients as appropriate  I updated the patient's status to the patient  Recommendations were made.  Questions were answered.  He expressed understanding &  appreciation.  -Disposition: The patient feels like he will be fine totally by himself at home.  I noted that is not safe.  He is resistant to skilled nursing facility.  He went from saying he has no one at home to that his wife is coming in from another country and his niece may be able to help as well.  I noted when we no family is here and available and they confirmed that they can take care of him, perhaps he can go home Monday with home health.  Otherwise I strongly recommend skilled nursing facility.  He said he is in God's hands and feels fine to go home by himself.  I tried to encourage him to stay until we have a safer discharge plan. Hopefully he will not leave AMA    Disposition:  The patient is from: Home Anticipate discharge to:  Skilled Nursing Facility (SNF) Anticipated Date of Discharge is:  September 12,2025   Barriers to discharge:  Pending Clinical improvement (more likely than not), Therapy assessment & Recommendations pending, Testing result pending, and Consultant clearance & sign off    Patient currently is NOT MEDICALLY STABLE for discharge from the hospital from a surgery standpoint.      I reviewed nursing notes, hospitalist notes, last 24 h vitals and pain scores, last 48 h intake and output, last 24 h labs and trends, and last 24 h imaging results.  I have reviewed this patient's available data, including medical history, events of note, test results, etc as part of my evaluation.   A significant portion of that time was spent in counseling. Care during the described time interval was provided by me.  This care required moderate level of medical decision making.  01/20/2024    Subjective: (Chief complaint)  No overt major events.  Feels like he is moving around better.  Listening to his Christian services through his phone.  Denies any nausea vomiting or pain.  Repair nursing just outside room  Objective:  Vital signs:  Vitals:   01/19/24 0835  01/19/24 1237 01/19/24 2020 01/20/24 0557  BP: (!) 142/91 (!) 153/96 (!) 144/78 101/66  Pulse: 90 87 82 61  Resp:  16 16 17   Temp:  98.2 F (36.8 C) 98.3 F (36.8 C) 97.8 F (36.6 C)  TempSrc:  Oral Oral Oral  SpO2:  100% 100% 98%  Weight:      Height:        Last BM Date : 01/18/24  Intake/Output   Yesterday:  09/12 0701 - 09/13 0700 In: 1565 [P.O.:1565] Out: 750 [Urine:750] This shift:  No intake/output data recorded.  Bowel function:  Flatus: YES  BM:  YES  Drain: (No drain)   Physical Exam:  General: Pt awake/alert in no acute distress.   Eyes: PERRL, normal EOM.  Sclera clear.  No icterus Neuro: CN II-XII intact w/o focal sensory/motor deficits. Lymph: No head/neck/groin lymphadenopathy Psych:  No delerium/psychosis/paranoia.  Occasionally mildly confused but redirectable and at best oriented x 4 -this is his baseline HENT: Normocephalic, Mucus membranes moist.  No thrush.  Mumbling which is his baseline. Neck: Supple, No tracheal deviation.  No obvious thyromegaly Chest: No pain to chest wall compression.  Good respiratory excursion.  No audible wheezing CV:  Pulses intact.  Regular  rhythm.  No major extremity edema MS: Normal AROM mjr joints.  No obvious deformity  Abdomen: Soft.  Nondistended.  Mildly tender at incisions only.  No evidence of peritonitis.  No incarcerated hernias.  Ext:  No deformity.  No mjr edema.  No cyanosis Skin: No petechiae / purpurea.  No major sores.  Warm and dry    Results:   Cultures: No results found for this or any previous visit (from the past 720 hours).  Labs: Results for orders placed or performed during the hospital encounter of 01/17/24 (from the past 48 hours)  Glucose, capillary     Status: Abnormal   Collection Time: 01/18/24 11:39 AM  Result Value Ref Range   Glucose-Capillary 169 (H) 70 - 99 mg/dL    Comment: Glucose reference range applies only to samples taken after fasting for at least 8 hours.    Comment 1 Notify RN   Glucose, capillary     Status: Abnormal   Collection Time: 01/18/24  4:20 PM  Result Value Ref Range   Glucose-Capillary 227 (H) 70 - 99 mg/dL    Comment: Glucose reference range applies only to samples taken after fasting for at least 8 hours.  Glucose, capillary     Status: Abnormal   Collection Time: 01/18/24  8:35 PM  Result Value Ref Range   Glucose-Capillary 355 (H) 70 - 99 mg/dL    Comment: Glucose reference range applies only to samples taken after fasting for at least 8 hours.  Basic metabolic panel     Status: Abnormal   Collection Time: 01/19/24  4:31 AM  Result Value Ref Range   Sodium 133 (L) 135 - 145 mmol/L   Potassium 4.3 3.5 - 5.1 mmol/L   Chloride 99 98 - 111 mmol/L   CO2 21 (L) 22 - 32 mmol/L   Glucose, Bld 103 (H) 70 - 99 mg/dL    Comment: Glucose reference range applies only to samples taken after fasting for at least 8 hours.   BUN 28 (H) 8 - 23 mg/dL   Creatinine, Ser 7.66 (H) 0.61 - 1.24 mg/dL   Calcium  9.1 8.9 - 10.3 mg/dL   GFR, Estimated 28 (L) >60 mL/min    Comment: (NOTE) Calculated using the CKD-EPI Creatinine Equation (2021)    Anion gap 13 5 - 15    Comment: Performed at [facility], [address] [place], [place] [address]  Hemoglobin     Status: Abnormal   Collection Time: 01/19/24  4:31 AM  Result Value Ref Range   Hemoglobin 10.2 (L) 13.0 - 17.0 g/dL    Comment: Performed at [facility], [address], [place], [place] [address]  Glucose, capillary     Status: Abnormal   Collection Time: 01/19/24  8:40 AM  Result Value Ref Range   Glucose-Capillary 153 (H) 70 - 99 mg/dL    Comment: Glucose reference range applies only to samples taken after fasting for at least 8 hours.  Glucose, capillary     Status: Abnormal   Collection Time: 01/19/24 12:31 PM  Result Value Ref Range   Glucose-Capillary 346 (H) 70 - 99 mg/dL    Comment: Glucose reference range applies only to samples  taken after fasting for at least 8 hours.  Glucose, capillary     Status: Abnormal   Collection Time: 01/19/24  5:26 PM  Result Value Ref Range   Glucose-Capillary 266 (H) 70 - 99 mg/dL    Comment: Glucose reference  range applies only to samples taken after fasting for at least 8 hours.  Glucose, capillary     Status: Abnormal   Collection Time: 01/19/24  8:27 PM  Result Value Ref Range   Glucose-Capillary 266 (H) 70 - 99 mg/dL    Comment: Glucose reference range applies only to samples taken after fasting for at least 8 hours.  Basic metabolic panel     Status: Abnormal   Collection Time: 01/20/24  5:42 AM  Result Value Ref Range   Sodium 133 (L) 135 - 145 mmol/L   Potassium 4.0 3.5 - 5.1 mmol/L   Chloride 98 98 - 111 mmol/L   CO2 23 22 - 32 mmol/L   Glucose, Bld 138 (H) 70 - 99 mg/dL    Comment: Glucose reference range applies only to samples taken after fasting for at least 8 hours.   BUN 24 (H) 8 - 23 mg/dL   Creatinine, Ser 7.85 (H) 0.61 - 1.24 mg/dL   Calcium  8.9 8.9 - 10.3 mg/dL   GFR, Estimated 31 (L) >60 mL/min    Comment: (NOTE) Calculated using the CKD-EPI Creatinine Equation (2021)    Anion gap 12 5 - 15    Comment: Performed at [facility], [address] [place], [place] [address]  Hemoglobin     Status: Abnormal   Collection Time: 01/20/24  5:42 AM  Result Value Ref Range   Hemoglobin 9.0 (L) 13.0 - 17.0 g/dL    Comment: Performed at [facility], [address], [place], [place] [address]  Glucose, capillary     Status: Abnormal   Collection Time: 01/20/24  7:50 AM  Result Value Ref Range   Glucose-Capillary 175 (H) 70 - 99 mg/dL    Comment: Glucose reference range applies only to samples taken after fasting for at least 8 hours.    Imaging / Studies: No results found.  Medications / Allergies: per chart  Antibiotics: Anti-infectives (From admission, onward)    Start     Dose/Rate Route Frequency Ordered Stop    01/17/24 2200  cefoTEtan  (CEFOTAN ) 2 g in sodium chloride  0.9 % 100 mL IVPB        2 g 200 mL/hr over 30 Minutes Intravenous Every 12 hours 01/17/24 1725 01/18/24 0832   01/17/24 1400  neomycin  (MYCIFRADIN ) tablet 1,000 mg  Status:  Discontinued       Placed in And Linked Group   1,000 mg Oral 3 times per day 01/17/24 1005 01/17/24 1013   01/17/24 1400  metroNIDAZOLE  (FLAGYL ) tablet 1,000 mg  Status:  Discontinued       Placed in And Linked Group   1,000 mg Oral 3 times per day 01/17/24 1005 01/17/24 1013   01/17/24 1015  cefoTEtan  (CEFOTAN ) 2 g in sodium chloride  0.9 % 100 mL IVPB        2 g 200 mL/hr over 30 Minutes Intravenous On call to O.R. 01/17/24 1005 01/17/24 1730         Note: Portions of this report may have been transcribed using voice recognition software. Every effort was made to ensure accuracy; however, inadvertent computerized transcription errors may be present.   Any transcriptional errors that result from this process are unintentional.    Elspeth KYM Schultze, MD, FACS, MASCRS Esophageal, Gastrointestinal & Colorectal Surgery Robotic and Minimally Invasive Surgery  [facility] [address], [address] [place], [place] [address] [phone] Fax [phone] Main  CONTACT INFORMATION: Weekday (9AM-5PM): Call [facility] main office at [phone] Weeknight (5PM-9AM) or Weekend/Holiday: Check EPIC Web Links tab & use AMION (password  [email or web address]) for General Surgery [facility] coverage  Please, DO NOT use SecureChat  (it is not reliable communication to reach operating surgeons & will lead to a delay in care).   Epic staff messaging available for outptient concerns needing 1-2 business day response.      01/20/2024  11:01 AM

## 2024-01-20 NOTE — Progress Notes (Signed)
 Consult Progress Note    Cory [name]   FMW:[id number]  DOB: 1946-04-20  DOA: 01/17/2024     3 PCP: [name], MD  Requesting provider: Dr. Sheldon Reason for consultation: medical management   Hospital Course: Mr. Flagg is a 78 year old male with PMH A-flutter, DM II, recent incidental CVA June 2025, CKD 3B, HTN, HLD, IDA. He was diagnosed with colon cancer in December 2024 and before able to schedule surgery he has had recurrent illnesses, hospitalizations, and recent stroke in June.  He has been able to schedule surgery recently and underwent clearance with neurology prior to surgery. He underwent robotic assisted sigmoid colectomy with anastomosis on 01/17/2024. We are asked to help assist with medical management postop.  Interval History:  No events overnight.  Still having some incontinent episodes in the bed.  He is becoming slightly agitated and wanting to go home instead of rehab.  Disposition planning ongoing.  Working better with therapy some, however help at home as the other concern as he lives alone with no support.   Assessment and Plan: * 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[facility]) - seen by Dr. Lanny 07/19/23; initially diagnosed 04/19/23 and had undergone evaluation with [facility] surgery back then but had some hospitalizations preventing surgery then stroke in June but now has finally undergone surgical intervention - per PET in Dec 2024: Hypermetabolic proximal descending colon mass. No evidence of locoregional or distant metastatic disease.  -Underwent surgical resection on 01/17/2024 - Path has returned back with invasive moderately differentiated adenocarcinoma -Further postop management per general surgery, primary team; patient going to be referred to oncology for follow-up - Bowel function has returned and diet has been advanced  Atrial flutter (HCC) - continue eliquis  and coreg  - PRN lopressor    HFrEF (heart failure with reduced ejection fraction) (HCC) - last echo  09/19/23: EF 25 to 30%, global hypokinesis, mild LVH, indeterminate diastolic parameters -Currently no signs/symptoms exacerbation - fluid status to be monitored postop - Follows with cardiology, last seen 10/12/2023 - Maintained outpatient on Coreg , Norvasc , BiDil , telmisartan , torsemide  - Some soft blood pressures today: Currently on amlodipine , Coreg , spironolactone , and torsemide  - no bidil  on med rec; will have pharmacy clarify  - Not a candidate for SGLT2 due to history of UTI per cardiology  Type 2 diabetes mellitus with chronic kidney disease, with long-term current use of insulin  (HCC) - Last A1c 10.5% on 01/05/2024 - continue Lantus  and SSI  CKD (chronic kidney disease) stage 4, GFR 15-29 ml/min (HCC) - patient has history of CKD3b. Baseline creat ~ 2.2 - 2.5, eGFR~ 22-25 - stable at baseline  - BMP as necessary - diet advanced to soft   History of CVA (cerebrovascular accident) - Recent CVA 10/19/2023 with MRI brain showing small acute left parietal infarcts; found incidentally when he was undergoing workup for hypothermia and hypoglycemia during that hospitalization - Continued on Eliquis  and statin at discharge - He has been seen by neurology in follow-up in July 2025  GERD (gastroesophageal reflux disease) - Continue Protonix   Depression - Continue Zoloft   Hx of right BKA (HCC) - hx OM with R BKA 2022  IDA (iron  deficiency anemia) - Baseline hemoglobin around 10 to 11 g/dL - Hgb stable; mild downtrend postop but no obvious signs of bleeding  Overweight (BMI 25.0-29.9) - Complicates overall prognosis and care - Body mass index is 25.8 kg/m.  Hypertension - Outpatient regimen: On amlodipine , Coreg , telmisartan , torsemide , spironolactone  - await med rec to clarify bidil  - Soft blood pressures noted  this morning on current regimen.  Will monitor further over the next 24 hours and make adjustments if necessary  Hyperlipidemia - Continue Lipitor   Old records  reviewed in assessment of this patient  Antimicrobials:   DVT prophylaxis:  SCD's Start: 01/17/24 1726 apixaban  (ELIQUIS ) tablet 5 mg   Code Status:   Code Status: Full Code  Mobility Assessment (Last 72 Hours)     Mobility Assessment     Row Name 01/20/24 1000 01/19/24 2140 01/19/24 1122 01/19/24 1038 01/18/24 2001   Does the patient have exclusion criteria? No - Perform mobility assessment No - Perform mobility assessment No - Perform mobility assessment -- No - Perform mobility assessment   What is the highest level of mobility based on the mobility assessment? -- Level 4 (Ambulates with assistance) - Balance while stepping forward/back - Complete Level 4 (Ambulates with assistance) - Balance while stepping forward/back - Complete Level 4 (Ambulates with assistance) - Balance while stepping forward/back - Complete Level 4 (Ambulates with assistance) - Balance while stepping forward/back - Complete   Is the above level different from baseline mobility prior to current illness? Yes - Recommend PT order Yes - Recommend PT order Yes - Recommend PT order -- Yes - Recommend PT order    Row Name 01/18/24 1251 01/18/24 1123 01/18/24 0825 01/17/24 2035 01/17/24 1743   Does the patient have exclusion criteria? -- -- No - Perform mobility assessment No - Perform mobility assessment No - Perform mobility assessment   What is the highest level of mobility based on the mobility assessment? Level 4 (Ambulates with assistance) - Balance while stepping forward/back - Complete Level 4 (Ambulates with assistance) - Balance while stepping forward/back - Complete Level 2 (Chairfast) - Balance while sitting on edge of bed and cannot stand Level 2 (Chairfast) - Balance while sitting on edge of bed and cannot stand  only dangled --   Is the above level different from baseline mobility prior to current illness? -- -- -- Yes - Recommend PT order --     Objective: Blood pressure (!) 90/56, pulse 70, temperature  97.9 F (36.6 C), temperature source Oral, resp. rate 16, height 5' 11 (1.803 m), weight 84.2 kg, SpO2 98%.  Examination:  Physical Exam Constitutional:      General: He is not in acute distress.    Appearance: Normal appearance.  HENT:     Head: Normocephalic and atraumatic.     Mouth/Throat:     Mouth: Mucous membranes are moist.  Eyes:     Extraocular Movements: Extraocular movements intact.  Cardiovascular:     Rate and Rhythm: Normal rate and regular rhythm.  Pulmonary:     Effort: Pulmonary effort is normal. No respiratory distress.     Breath sounds: Normal breath sounds. No wheezing.  Abdominal:     General: Bowel sounds are normal. There is no distension.     Palpations: Abdomen is soft.     Tenderness: There is generalized abdominal tenderness (mild).     Comments: Multiple laparoscopic incisions noted and lower horizontal incision midline  Musculoskeletal:        General: Normal range of motion.     Cervical back: Normal range of motion and neck supple.  Skin:    General: Skin is warm and dry.  Neurological:     General: No focal deficit present.     Mental Status: He is alert.  Psychiatric:        Mood and Affect: Mood normal.  Behavior: Behavior normal.      Data Reviewed: Results for orders placed or performed during the hospital encounter of 01/17/24 (from the past 24 hours)  Glucose, capillary     Status: Abnormal   Collection Time: 01/19/24  5:26 PM  Result Value Ref Range   Glucose-Capillary 266 (H) 70 - 99 mg/dL  Glucose, capillary     Status: Abnormal   Collection Time: 01/19/24  8:27 PM  Result Value Ref Range   Glucose-Capillary 266 (H) 70 - 99 mg/dL  Basic metabolic panel     Status: Abnormal   Collection Time: 01/20/24  5:42 AM  Result Value Ref Range   Sodium 133 (L) 135 - 145 mmol/L   Potassium 4.0 3.5 - 5.1 mmol/L   Chloride 98 98 - 111 mmol/L   CO2 23 22 - 32 mmol/L   Glucose, Bld 138 (H) 70 - 99 mg/dL   BUN 24 (H) 8 - 23 mg/dL    Creatinine, Ser 7.85 (H) 0.61 - 1.24 mg/dL   Calcium  8.9 8.9 - 10.3 mg/dL   GFR, Estimated 31 (L) >60 mL/min   Anion gap 12 5 - 15  Hemoglobin     Status: Abnormal   Collection Time: 01/20/24  5:42 AM  Result Value Ref Range   Hemoglobin 9.0 (L) 13.0 - 17.0 g/dL  Glucose, capillary     Status: Abnormal   Collection Time: 01/20/24  7:50 AM  Result Value Ref Range   Glucose-Capillary 175 (H) 70 - 99 mg/dL  Glucose, capillary     Status: Abnormal   Collection Time: 01/20/24 12:11 PM  Result Value Ref Range   Glucose-Capillary 131 (H) 70 - 99 mg/dL    I have reviewed pertinent nursing notes, vitals, labs, and images as necessary. I have ordered labwork to follow up on as indicated.  I have reviewed the last notes from staff over past 24 hours. I have discussed patient's care plan and test results with nursing staff, CM/SW, and other staff as appropriate.  Time spent: Greater than 50% of the 55 minute visit was spent in counseling/coordination of care for the patient as laid out in the A&P.   LOS: 3 days   Alm Apo, MD [facility] 01/20/2024, 5:03 PM

## 2024-01-20 NOTE — Progress Notes (Addendum)
 Pt incontinent of urine and stool several times today. Pt also w late visitor today, overheard pt tell his visitor I came here because they gave me a real good rate . . . ?? If pt thinks he's at a hotel, reoriented pt and he now remembers he's at the hospital.

## 2024-01-20 NOTE — Progress Notes (Signed)
 Physical Therapy Treatment Patient Details Name: Cory [name] MRN: [id number] DOB: 1945-09-05 Today's Date: 01/20/2024   History of Present Illness Cory [name] is a 78 year old male with robotic sigmoid colectomy with anastomosis 2* colon CA PMH: R BKA,  A-flutter, DM II, recent incidental CVA June 2025, CKD 3B, HTN, HLD, IDA.    PT Comments  Pt is pleasant and agreeable to mobilize. NT assisting pt to [facility] on arrival. Per nursing staff pt is intermittently confused and has been needing assist d/t bowel incontinence.  Pt is Ox3 at time of PT eval and this session, follows commands consistently.  Pt amb 250' with RW and CGA fade to supervision. Pt is able to don his prosthesis I'ly. MD/general surgery  is recommending SNF.   From PT standpoint pt could d/c home with HHPT, he states he has caregiver 1x/wk to help with household tasks. Will defer disposition to MD and TOC.  Continue PT in acute setting.   If plan is discharge home, recommend the following: Help with stairs or ramp for entrance;Assist for transportation;A little help with walking and/or transfers;A little help with bathing/dressing/bathroom;Assistance with cooking/housework   Can travel by private vehicle     Yes  Equipment Recommendations  None recommended by PT    Recommendations for Other Services       Precautions / Restrictions Precautions Precautions: Fall Recall of Precautions/Restrictions: Intact Restrictions Weight Bearing Restrictions Per Provider Order: No     Mobility  Bed Mobility Overal bed mobility: Needs Assistance Bed Mobility: Supine to Sit     Supine to sit: Modified independent (Device/Increase time)     General bed mobility comments: pt uses rail, no physical assist; HOB at 30 degrees    Transfers Overall transfer level: Needs assistance Equipment used: Rolling walker (2 wheels) Transfers: Sit to/from Stand, Bed to chair/wheelchair/BSC Sit to Stand: Supervision, Contact guard  assist     Squat pivot transfers: Contact guard assist     General transfer comment: pt demonstrates proper hand placement and is able to stand from bed, BSC with CGA to supervision for safety    Ambulation/Gait Ambulation/Gait assistance: Contact guard assist, Supervision Gait Distance (Feet): 350 Feet Assistive device: Rolling walker (2 wheels) Gait Pattern/deviations: Step-through pattern, Decreased dorsiflexion - left, Steppage, Trunk flexed Gait velocity: decreased but functional     General Gait Details: CGA with fade to supervision for safety, intermittent cues for posture, proper distance from RW. no LOB   Stairs             Wheelchair Mobility     Tilt Bed    Modified Rankin (Stroke Patients Only)       Balance     Sitting balance-Leahy Scale: Good     Standing balance support: No upper extremity supported, Reliant on assistive device for balance, During functional activity Standing balance-Leahy Scale: Fair Standing balance comment: with R prosthetic in place                            Communication Communication Communication: No apparent difficulties  Cognition Arousal: Alert Behavior During Therapy: WFL for tasks assessed/performed   PT - Cognitive impairments: No apparent impairments                       PT - Cognition Comments: pt is A&O x3 at time of PT eval. nursing staff reports intermittent Following commands: Intact      Cueing Cueing Techniques:  Verbal cues  Exercises      General Comments        Pertinent Vitals/Pain Pain Assessment Pain Assessment: No/denies pain    Home Living                          Prior Function            PT Goals (current goals can now be found in the care plan section) Acute Rehab PT Goals PT Goal Formulation: With patient Time For Goal Achievement: 02/01/24 Potential to Achieve Goals: Good Progress towards PT goals: Progressing toward goals     Frequency    Min 3X/week      PT Plan      Co-evaluation              AM-PAC PT 6 Clicks Mobility   Outcome Measure  Help needed turning from your back to your side while in a flat bed without using bedrails?: A Little Help needed moving from lying on your back to sitting on the side of a flat bed without using bedrails?: None Help needed moving to and from a bed to a chair (including a wheelchair)?: A Little Help needed standing up from a chair using your arms (e.g., wheelchair or bedside chair)?: A Little Help needed to walk in hospital room?: A Little Help needed climbing 3-5 steps with a railing? : A Little 6 Click Score: 19    End of Session Equipment Utilized During Treatment: Gait belt Activity Tolerance: Patient tolerated treatment well Patient left: in chair;with call bell/phone within reach;with chair alarm set Nurse Communication: Mobility status PT Visit Diagnosis: Other abnormalities of gait and mobility (R26.89)     Time: 8462-8390 PT Time Calculation (min) (ACUTE ONLY): 32 min  Charges:    $Gait Training: 23-37 mins PT General Charges $$ ACUTE PT VISIT: 1 Visit                     Nour Scalise, PT  Acute Rehab Dept [facility]) [phone]  01/20/2024    [facility] 01/20/2024, 4:30 PM

## 2024-01-20 NOTE — TOC Progression Note (Addendum)
 Transition of Care [facility]) - Progression Note    Patient Details  Name: Cory [name] MRN: [id number] Date of Birth: 09/11/1945  Transition of Care [facility]) CM/SW Contact  Sonda Manuella Quill, RN Phone Number: 01/20/2024, 6:10 PM  Clinical Narrative:    [facility] consult for SNF; spoke w/ pt in room; he politely declined SNF; TOC will update Dr Sheldon and Dr Patsy notified via secure chat; [facility] is following.                     Expected Discharge Plan and Services         Expected Discharge Date: 01/19/24                                     Social Drivers of Health (SDOH) Interventions SDOH Screenings   Food Insecurity: Food Insecurity Present (01/17/2024)  Housing: High Risk (01/17/2024)  Transportation Needs: No Transportation Needs (01/17/2024)  Utilities: At Risk (01/17/2024)  Depression (PHQ2-9): Low Risk  (10/09/2023)  Social Connections: Moderately Integrated (01/17/2024)  Recent Concern: Social Connections - Moderately Isolated (10/19/2023)  Tobacco Use: Medium Risk (01/17/2024)    Readmission Risk Interventions    01/18/2024   12:27 PM  Readmission Risk Prevention Plan  Transportation Screening Complete  Medication Review (RN Care Manager) Complete  PCP or Specialist appointment within 3-5 days of discharge Complete  HRI or Home Care Consult Complete  SW Recovery Care/Counseling Consult Complete  Palliative Care Screening Not Applicable  Skilled Nursing Facility Complete

## 2024-01-20 NOTE — Plan of Care (Signed)
  Problem: Bowel/Gastric: Goal: Gastrointestinal status for postoperative course will improve Outcome: Progressing   

## 2024-01-20 NOTE — Plan of Care (Signed)
  Problem: Education: Goal: Understanding of discharge needs will improve Outcome: Progressing Goal: Verbalization of understanding of the causes of altered bowel function will improve Outcome: Progressing   Problem: Activity: Goal: Ability to tolerate increased activity will improve Outcome: Progressing   Problem: Bowel/Gastric: Goal: Gastrointestinal status for postoperative course will improve Outcome: Progressing

## 2024-01-21 DIAGNOSIS — C189 Malignant neoplasm of colon, unspecified: Secondary | ICD-10-CM | POA: Diagnosis not present

## 2024-01-21 DIAGNOSIS — Z794 Long term (current) use of insulin: Secondary | ICD-10-CM | POA: Diagnosis not present

## 2024-01-21 DIAGNOSIS — E1122 Type 2 diabetes mellitus with diabetic chronic kidney disease: Secondary | ICD-10-CM | POA: Diagnosis not present

## 2024-01-21 DIAGNOSIS — I483 Typical atrial flutter: Secondary | ICD-10-CM | POA: Diagnosis not present

## 2024-01-21 LAB — GLUCOSE, CAPILLARY
Glucose-Capillary: 100 mg/dL — ABNORMAL HIGH (ref 70–99)
Glucose-Capillary: 249 mg/dL — ABNORMAL HIGH (ref 70–99)
Glucose-Capillary: 213 mg/dL — ABNORMAL HIGH (ref 70–99)
Glucose-Capillary: 172 mg/dL — ABNORMAL HIGH (ref 70–99)

## 2024-01-21 LAB — HEMOGLOBIN: Hemoglobin: 9.2 g/dL — ABNORMAL LOW (ref 13.0–17.0)

## 2024-01-21 MED ORDER — INSULIN GLARGINE 100 UNIT/ML ~~LOC~~ SOLN
20.0000 [IU] | Freq: Every day | SUBCUTANEOUS | Status: DC
  Filled 2024-01-21: qty 0.2

## 2024-01-21 MED ORDER — INSULIN GLARGINE 100 UNIT/ML ~~LOC~~ SOLN
20.0000 [IU] | Freq: Every day | SUBCUTANEOUS | Status: DC
  Administered 2024-01-21 – 2024-01-23 (×3): 20 [IU] via SUBCUTANEOUS
  Filled 2024-01-21 (×4): qty 0.2

## 2024-01-21 MED ORDER — LOPERAMIDE HCL 2 MG PO CAPS: 2.0000 mg | ORAL_CAPSULE | Freq: Two times a day (BID) | ORAL | Status: DC | PRN

## 2024-01-21 MED ORDER — INSULIN ASPART 100 UNIT/ML IJ SOLN
6.0000 [IU] | Freq: Three times a day (TID) | INTRAMUSCULAR | Status: DC
  Administered 2024-01-21 – 2024-01-22 (×4): 6 [IU] via SUBCUTANEOUS

## 2024-01-21 MED ORDER — LOPERAMIDE HCL 2 MG PO CAPS
2.0000 mg | ORAL_CAPSULE | Freq: Every day | ORAL | Status: DC
  Administered 2024-01-21 – 2024-01-23 (×3): 2 mg via ORAL
  Filled 2024-01-21 (×3): qty 1

## 2024-01-21 NOTE — Plan of Care (Signed)
  Problem: Education: Goal: Understanding of discharge needs will improve Outcome: Progressing Goal: Verbalization of understanding of the causes of altered bowel function will improve Outcome: Progressing   Problem: Activity: Goal: Ability to tolerate increased activity will improve Outcome: Progressing   Problem: Bowel/Gastric: Goal: Gastrointestinal status for postoperative course will improve Outcome: Progressing   Problem: Health Behavior/Discharge Planning: Goal: Identification of community resources to assist with postoperative recovery needs will improve Outcome: Progressing   Problem: Nutritional: Goal: Will attain and maintain optimal nutritional status will improve Outcome: Progressing   Problem: Clinical Measurements: Goal: Postoperative complications will be avoided or minimized Outcome: Progressing   Problem: Respiratory: Goal: Respiratory status will improve Outcome: Progressing   Problem: Skin Integrity: Goal: Will show signs of wound healing Outcome: Progressing   Problem: Education: Goal: Knowledge of General Education information will improve Description: Including pain rating scale, medication(s)/side effects and non-pharmacologic comfort measures Outcome: Progressing   Problem: Health Behavior/Discharge Planning: Goal: Ability to manage health-related needs will improve Outcome: Progressing   Problem: Clinical Measurements: Goal: Ability to maintain clinical measurements within normal limits will improve Outcome: Progressing Goal: Will remain free from infection Outcome: Progressing Goal: Diagnostic test results will improve Outcome: Progressing Goal: Respiratory complications will improve Outcome: Progressing Goal: Cardiovascular complication will be avoided Outcome: Progressing   Problem: Activity: Goal: Risk for activity intolerance will decrease Outcome: Progressing   Problem: Nutrition: Goal: Adequate nutrition will be  maintained Outcome: Progressing   Problem: Coping: Goal: Level of anxiety will decrease Outcome: Progressing   Problem: Elimination: Goal: Will not experience complications related to bowel motility Outcome: Progressing Goal: Will not experience complications related to urinary retention Outcome: Progressing   Problem: Pain Managment: Goal: General experience of comfort will improve and/or be controlled Outcome: Progressing   Problem: Safety: Goal: Ability to remain free from injury will improve Outcome: Progressing   Problem: Skin Integrity: Goal: Risk for impaired skin integrity will decrease Outcome: Progressing   Problem: Education: Goal: Ability to describe self-care measures that may prevent or decrease complications (Diabetes Survival Skills Education) will improve Outcome: Progressing Goal: Individualized Educational Video(s) Outcome: Progressing   Problem: Coping: Goal: Ability to adjust to condition or change in health will improve Outcome: Progressing   Problem: Fluid Volume: Goal: Ability to maintain a balanced intake and output will improve Outcome: Progressing   Problem: Health Behavior/Discharge Planning: Goal: Ability to identify and utilize available resources and services will improve Outcome: Progressing Goal: Ability to manage health-related needs will improve Outcome: Progressing   Problem: Metabolic: Goal: Ability to maintain appropriate glucose levels will improve Outcome: Progressing   Problem: Nutritional: Goal: Maintenance of adequate nutrition will improve Outcome: Progressing Goal: Progress toward achieving an optimal weight will improve Outcome: Progressing   Problem: Skin Integrity: Goal: Risk for impaired skin integrity will decrease Outcome: Progressing   Problem: Tissue Perfusion: Goal: Adequacy of tissue perfusion will improve Outcome: Progressing

## 2024-01-21 NOTE — Progress Notes (Signed)
 01/21/2024  Cory [name] [id number] [birth year]  CARE TEAM: PCP: Cory [place] [name] [name], MD  Outpatient Care Team: Patient Care Team: Cory [place] [name] [name], MD as PCP - General (Family Medicine) Cory Lurena POUR, MD as PCP - Cardiology (Cardiology) Cory Norleen [name], MD as Referring Physician (Cardiology) Cory Grayson, MD as Referring Physician (Geriatric Medicine) Cory [name], [name], NP as Nurse Practitioner (Nurse Practitioner) Cory Callander, MD as Consulting Physician (Hematology and Oncology) Cory Anes, MD as Referring Physician (Surgical Oncology) [name], Cory SAUNDERS, MD as Consulting Physician (Cardiology) Cory [place] [name] [name], MD (Family Medicine) Cory Standing, MD as Consulting Physician (General Surgery) Cory Isles, MD as Consulting Physician (Neurology) [name], Cory LABOR, FNP as WOC Nurse (Nurse Practitioner) [name], Cory LABOR, FNP as WOC Nurse (Nurse Practitioner)  Inpatient Treatment Team: Treatment Team:  Cory Standing, MD Cory Lenis, MD Cory Pinon, MD Cory Cathlean [name], RN Cory [name], NT [place], Cory [name], LCSW Cory Angeline NOVAK, RN Cory [name], [facility]   Problem List:   Principal Problem:   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Cory [name]) Active Problems:   Hyperlipidemia   Hypertension   Overweight (BMI 25.0-29.9)   Type 2 diabetes mellitus with chronic kidney disease, with long-term current use of insulin  (HCC)   Atrial flutter (HCC)   CKD (chronic kidney disease) stage 4, GFR 15-29 ml/min (HCC)   IDA (iron  deficiency anemia)   History of prostate cancer   Hx of right BKA (HCC)   HFrEF (heart failure with reduced ejection fraction) (HCC)   Depression   GERD (gastroesophageal reflux disease)   History of CVA (cerebrovascular accident)   01/17/2024  POST-OPERATIVE DIAGNOSIS:  PROXIMAL SIGMOID COLON CANCER   PROCEDURE:   -ROBOTIC SIGMOID COLECTOMY WITH ANASTOMOSIS -INTRAOPERATIVE ASSESSMENT OF TISSUE VASCULAR PERFUSION USING ICG (indocyanine green )  IMMUNOFLUORESCENCE -TRANSVERSUS ABDOMINIS PLANE (TAP) BLOCK - BILATERAL -FLEXIBLE SIGMOIDOSCOPY   SURGEON:  [name] [name], MD  OR FINDINGS:   Patient had proximal sigmoid colon cancer flopped up into the left upper quadrant.  No obvious metastatic disease on visceral parietal peritoneum or liver.   It is a 31mm EEA anastomosis ( mid descending colon connected to proximal rectum.)  It rests 14 cm from the anal verge by flexible sigmoidoscopy  Assessment [name] Stay = 4 days) 4 Days Post-Op    Relatively stable    Plan:  ERAS protocol  Follow-up on pathology notes pT3 pN0 adenocarcinoma.  49 lymph nodes sampled negative.  Stage II disease.  Update and told the patient bedside.  I think surgery is all he needs.  Given his advanced age and numerous health issues, would not be a great candidate for post adjuvant chemotherapy or treatment anyway.  I do think it is wise for him to follow-up at the cancer center for survival pathway a colonoscopy in a year.  Hopefully he will be able to be compliant with this.   Tolerating diet.  Some loose bowel movements.  Added soluble polycarbophil and nighttime Imodium  to help thicken and slow down stools.  He can have extra loperamide  as needed.  Poorly controlled diabetes with significant hyperglycemia.  Better controlled on Lantus .  Defer to medicine but looks like current regimen working okay  Hemoglobin stable for 48 hours =  okay to resume Eliquis  anticoagulation.  Hemoglobin stable so far.  -monitor electrolytes & replace as needed  Keep K>4, Mg>2, Phos>3  HTN control.  Blood pressure soft.  Wonder if he should hold his amlodipine .  Defer to medicine.  -VTE prophylaxis- SCDs.  Anticoagulation prophyllaxis  SQ as appropriate  -mobilize as tolerated to help recovery.  He has a BKA but claims that he ambulates well with his prosthesis.  I wrote for therapies to help evaluate him given his numerous hospitalizations and somewhat  deconditioned state.  He has told people he can get his prosthesis on by himself and then tells me he can.  Enlist therapies in moderate/high risk patients as appropriate  I updated the patient's status to the patient  Recommendations were made.  Questions were answered.  He expressed understanding & appreciation.  -Disposition: The patient feels like he will be fine totally by himself at home.  I noted that is not safe.  He is resistant to skilled nursing facility.  He went from saying he has no one at home to that his wife is coming in from another country and his niece may be able to help as well.  I noted when family is here and available and they confirmed that they can take care of him, perhaps he can go home Monday 9/15 with home health.  Otherwise I strongly recommend skilled nursing facility.  Hopefully he will not leave AMA    Disposition:  The patient is from: Home Anticipate discharge to:  Skilled Nursing Facility (SNF) Anticipated Date of Discharge is:  September 15,2025   Barriers to discharge:  Pending Clinical improvement (more likely than not), Therapy assessment & Recommendations pending, Testing result pending, and Consultant clearance & sign off    Patient currently is NOT MEDICALLY STABLE for discharge from the [name] from a surgery standpoint.      I reviewed nursing notes, hospitalist notes, last 24 h vitals and pain scores, last 48 h intake and output, last 24 h labs and trends, and last 24 h imaging results.  I have reviewed this patient's available data, including medical history, events of note, test results, etc as part of my evaluation.   A significant portion of that time was spent in counseling. Care during the described time interval was provided by me.  This care required moderate level of medical decision making.  01/21/2024    Subjective: (Chief complaint) Some loose bowel movements.  No nausea or vomiting.  Sometimes  confused.  Objective:  Vital signs:  Vitals:   01/20/24 1418 01/20/24 2004 01/21/24 0500 01/21/24 0533  BP: (!) 90/56 129/80  115/78  Pulse: 70 62  65  Resp: 16 14  16   Temp: 97.9 F (36.6 C) 97.8 F (36.6 C)  98.6 F (37 C)  TempSrc: Oral Oral  Oral  SpO2: 98% 100%  99%  Weight:   82.7 kg   Height:        Last BM Date : 01/18/24  Intake/Output   Yesterday:  09/13 0701 - 09/14 0700 In: 2400 [P.O.:2400] Out: 1100 [Urine:1100] This shift:  Total I/O In: 483 [P.O.:480; I.V.:3] Out: 600 [Urine:600]  Bowel function:  Flatus: YES  BM:  YES  Drain: (No drain)   Physical Exam:  General: Pt resting to awaken awake/alert in no acute distress.   Eyes: PERRL, normal EOM.  Sclera clear.  No icterus Neuro: CN II-XII intact w/o focal sensory/motor deficits. Lymph: No head/neck/groin lymphadenopathy Psych:  No delerium/psychosis/paranoia.  Occasionally mildly confused but redirectable and at best oriented x 4 -this is his baseline HENT: Normocephalic, Mucus membranes moist.  No thrush.  Mumbling which is his baseline. Neck: Supple, No tracheal deviation.  No obvious thyromegaly Chest: No pain to chest wall compression.  Good respiratory excursion.  No audible wheezing CV:  Pulses intact.  Regular rhythm.  No major extremity edema MS: Normal AROM mjr joints.  No obvious deformity  Abdomen: Soft.  Nondistended.  Nontender.  No evidence of peritonitis.  No incarcerated hernias.  Ext:  No deformity.  No mjr edema.  No cyanosis Skin: No petechiae / purpurea.  No major sores.  Warm and dry    Results:   Cultures: No results found for this or any previous visit (from the past 720 hours).  Labs: Results for orders placed or performed during the [name] encounter of 01/17/24 (from the past 48 hours)  Glucose, capillary     Status: Abnormal   Collection Time: 01/19/24 12:31 PM  Result Value Ref Range   Glucose-Capillary 346 (H) 70 - 99 mg/dL    Comment: Glucose reference  range applies only to samples taken after fasting for at least 8 hours.  Glucose, capillary     Status: Abnormal   Collection Time: 01/19/24  5:26 PM  Result Value Ref Range   Glucose-Capillary 266 (H) 70 - 99 mg/dL    Comment: Glucose reference range applies only to samples taken after fasting for at least 8 hours.  Glucose, capillary     Status: Abnormal   Collection Time: 01/19/24  8:27 PM  Result Value Ref Range   Glucose-Capillary 266 (H) 70 - 99 mg/dL    Comment: Glucose reference range applies only to samples taken after fasting for at least 8 hours.  Basic metabolic panel     Status: Abnormal   Collection Time: 01/20/24  5:42 AM  Result Value Ref Range   Sodium 133 (L) 135 - 145 mmol/L   Potassium 4.0 3.5 - 5.1 mmol/L   Chloride 98 98 - 111 mmol/L   CO2 23 22 - 32 mmol/L   Glucose, Bld 138 (H) 70 - 99 mg/dL    Comment: Glucose reference range applies only to samples taken after fasting for at least 8 hours.   BUN 24 (H) 8 - 23 mg/dL   Creatinine, Ser 7.85 (H) 0.61 - 1.24 mg/dL   Calcium  8.9 8.9 - 10.3 mg/dL   GFR, Estimated 31 (L) >60 mL/min    Comment: (NOTE) Calculated using the CKD-EPI Creatinine Equation (2021)    Anion gap 12 5 - 15    Comment: Performed at [facility], [address], [place], [place] [address]  Hemoglobin     Status: Abnormal   Collection Time: 01/20/24  5:42 AM  Result Value Ref Range   Hemoglobin 9.0 (L) 13.0 - 17.0 g/dL    Comment: Performed at [facility], [address], [place], [place] [address]  Glucose, capillary     Status: Abnormal   Collection Time: 01/20/24  7:50 AM  Result Value Ref Range   Glucose-Capillary 175 (H) 70 - 99 mg/dL    Comment: Glucose reference range applies only to samples taken after fasting for at least 8 hours.  Glucose, capillary     Status: Abnormal   Collection Time: 01/20/24 12:11 PM  Result Value Ref Range   Glucose-Capillary 131 (H) 70 - 99 mg/dL    Comment:  Glucose reference range applies only to samples taken after fasting for at least 8 hours.  Glucose, capillary     Status: Abnormal   Collection Time: 01/20/24  5:37 PM  Result Value Ref Range   Glucose-Capillary 294 (H) 70 - 99 mg/dL    Comment: Glucose reference range applies only  to samples taken after fasting for at least 8 hours.  Glucose, capillary     Status: Abnormal   Collection Time: 01/20/24  9:20 PM  Result Value Ref Range   Glucose-Capillary 354 (H) 70 - 99 mg/dL    Comment: Glucose reference range applies only to samples taken after fasting for at least 8 hours.  Hemoglobin     Status: Abnormal   Collection Time: 01/21/24  4:37 AM  Result Value Ref Range   Hemoglobin 9.2 (L) 13.0 - 17.0 g/dL    Comment: Performed at [name], [address], [place], [place] [address]  Glucose, capillary     Status: Abnormal   Collection Time: 01/21/24  7:42 AM  Result Value Ref Range   Glucose-Capillary 100 (H) 70 - 99 mg/dL    Comment: Glucose reference range applies only to samples taken after fasting for at least 8 hours.    Imaging / Studies: No results found.  Medications / Allergies: per chart  Antibiotics: Anti-infectives (From admission, onward)    Start     Dose/Rate Route Frequency Ordered Stop   01/17/24 2200  cefoTEtan  (CEFOTAN ) 2 g in sodium chloride  0.9 % 100 mL IVPB        2 g 200 mL/hr over 30 Minutes Intravenous Every 12 hours 01/17/24 1725 01/18/24 0832   01/17/24 1400  neomycin  (MYCIFRADIN ) tablet 1,000 mg  Status:  Discontinued       Placed in And Linked Group   1,000 mg Oral 3 times per day 01/17/24 1005 01/17/24 1013   01/17/24 1400  metroNIDAZOLE  (FLAGYL ) tablet 1,000 mg  Status:  Discontinued       Placed in And Linked Group   1,000 mg Oral 3 times per day 01/17/24 1005 01/17/24 1013   01/17/24 1015  cefoTEtan  (CEFOTAN ) 2 g in sodium chloride  0.9 % 100 mL IVPB        2 g 200 mL/hr over 30 Minutes Intravenous On call to O.R.  01/17/24 1005 01/17/24 1730         Note: Portions of this report may have been transcribed using voice recognition software. Every effort was made to ensure accuracy; however, inadvertent computerized transcription errors may be present.   Any transcriptional errors that result from this process are unintentional.    Elspeth KYM Schultze, MD, FACS, MASCRS Esophageal, Gastrointestinal & Colorectal Surgery Robotic and Minimally Invasive Surgery  [facility] [address], [address] [place], [place] [address] [phone] Fax [phone] Main  CONTACT INFORMATION: Weekday (9AM-5PM): Call [facility] main office at [phone] Weeknight (5PM-9AM) or Weekend/Holiday: Check EPIC Web Links tab & use AMION (password  [email or web address]) for General Surgery [facility] coverage  Please, DO NOT use SecureChat  (it is not reliable communication to reach operating surgeons & will lead to a delay in care).   Epic staff messaging available for outptient concerns needing 1-2 business day response.      01/21/2024  10:55 AM

## 2024-01-21 NOTE — Progress Notes (Signed)
 Consult Progress Note    Cory [name]   FMW:[id number]  DOB: 1946/02/15  DOA: 01/17/2024     4 PCP: Cory [name]  Requesting provider: Dr. Sheldon Reason for consultation: medical management   Hospital Course: Cory [name] is a 78 year old male with PMH A-flutter, DM II, recent incidental CVA June 2025, CKD 3B, HTN, HLD, IDA. He was diagnosed with colon cancer in December 2024 and before able to schedule surgery he has had recurrent illnesses, hospitalizations, and recent stroke in June.  He has been able to schedule surgery recently and underwent clearance with neurology prior to surgery. He underwent robotic assisted sigmoid colectomy with anastomosis on 01/17/2024. We are asked to help assist with medical management postop.  Interval History:  No events overnight.  Still having some incontinent episodes in the bed of stool.  He's cooperative despite wanting to go home.  Awaiting plan for either family to care for him at home vs going to SNF although he's declining that.   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 Lanny 07/19/23; initially diagnosed 04/19/23 and had undergone evaluation with [facility] surgery back then but had some hospitalizations preventing surgery then stroke in June but now has finally undergone surgical intervention - per PET in Dec 2024: Hypermetabolic proximal descending colon mass. No evidence of locoregional or distant metastatic disease.  -Underwent surgical resection on 01/17/2024 - Path has returned back with invasive moderately differentiated adenocarcinoma -Further postop management per general surgery, primary team; patient going to be referred to oncology for follow-up - Bowel function has returned and diet has been advanced - Having some stool incontinence in bed.  Imodium  added for surgery  Atrial flutter (HCC) - continue eliquis  and coreg  - Remains rate controlled - PRN lopressor    HFrEF (heart failure with reduced  ejection fraction) (HCC) - last echo 09/19/23: EF 25 to 30%, global hypokinesis, mild LVH, indeterminate diastolic parameters -Currently no signs/symptoms exacerbation - fluid status to be monitored postop - Follows with cardiology, last seen 10/12/2023 - Maintained outpatient on Coreg , Norvasc , BiDil , telmisartan , torsemide  - Some soft blood pressures today: Currently on amlodipine , Coreg , spironolactone , and torsemide  - no bidil  on med rec; will have pharmacy clarify  - Not a candidate for SGLT2 due to history of UTI per cardiology  Type 2 diabetes mellitus with chronic kidney disease, with long-term current use of insulin  (HCC) - Last A1c 10.5% on 01/05/2024 - Lantus  dose increased on 01/21/2024 - continue Lantus  and SSI  CKD (chronic kidney disease) stage 4, GFR 15-29 ml/min (HCC) - patient has history of CKD3b. Baseline creat ~ 2.2 - 2.5, eGFR~ 22-25 - stable at baseline  - BMP as necessary - diet advanced to soft   History of CVA (cerebrovascular accident) - Recent CVA 10/19/2023 with MRI brain showing small acute left parietal infarcts; found incidentally when he was undergoing workup for hypothermia and hypoglycemia during that hospitalization - Continued on Eliquis  and statin at discharge - He has been seen by neurology in follow-up in July 2025  GERD (gastroesophageal reflux disease) - Continue Protonix   Depression - Continue Zoloft   Hx of right BKA (HCC) - hx OM with R BKA 2022  IDA (iron  deficiency anemia) - Baseline hemoglobin around 10 to 11 g/dL - Hgb stable; mild downtrend postop but no obvious signs of bleeding  Overweight (BMI 25.0-29.9) - Complicates overall prognosis and care - Body mass index is 25.8 kg/m.  Hypertension - Outpatient regimen: On amlodipine , Coreg , telmisartan , torsemide ,  spironolactone  - await med rec to clarify bidil  - Soft blood pressures noted this morning on current regimen.  Will monitor further over the next 24 hours and make  adjustments if necessary  Hyperlipidemia - Continue Lipitor   Old records reviewed in assessment of this patient  Antimicrobials:   DVT prophylaxis:  SCD's Start: 01/17/24 1726 apixaban  (ELIQUIS ) tablet 5 mg   Code Status:   Code Status: Full Code  Mobility Assessment (Last 72 Hours)     Mobility Assessment     Row Name 01/21/24 0850 01/20/24 2004 01/20/24 1000 01/19/24 2140 01/19/24 1122   Does the patient have exclusion criteria? No - Perform mobility assessment No - Perform mobility assessment No - Perform mobility assessment No - Perform mobility assessment No - Perform mobility assessment   What is the highest level of mobility based on the mobility assessment? Level 3 (Stands with assistance) - Balance while standing  and cannot march in place Level 4 (Ambulates with assistance) - Balance while stepping forward/back - Complete -- Level 4 (Ambulates with assistance) - Balance while stepping forward/back - Complete Level 4 (Ambulates with assistance) - Balance while stepping forward/back - Complete   Is the above level different from baseline mobility prior to current illness? Yes - Recommend PT order Yes - Recommend PT order Yes - Recommend PT order Yes - Recommend PT order Yes - Recommend PT order    Row Name 01/19/24 1038 01/18/24 2001         Does the patient have exclusion criteria? -- No - Perform mobility assessment      What is the highest level of mobility based on the mobility assessment? Level 4 (Ambulates with assistance) - Balance while stepping forward/back - Complete Level 4 (Ambulates with assistance) - Balance while stepping forward/back - Complete      Is the above level different from baseline mobility prior to current illness? -- Yes - Recommend PT order        Objective: Blood pressure 115/78, pulse 65, temperature 98.6 F (37 C), temperature source Oral, resp. rate 16, height 5' 11 (1.803 m), weight 82.7 kg, SpO2 99%.  Examination:  Physical  Exam Constitutional:      General: He is not in acute distress.    Appearance: Normal appearance.  HENT:     Head: Normocephalic and atraumatic.     Mouth/Throat:     Mouth: Mucous membranes are moist.  Eyes:     Extraocular Movements: Extraocular movements intact.  Cardiovascular:     Rate and Rhythm: Normal rate and regular rhythm.  Pulmonary:     Effort: Pulmonary effort is normal. No respiratory distress.     Breath sounds: Normal breath sounds. No wheezing.  Abdominal:     General: Bowel sounds are normal. There is no distension.     Palpations: Abdomen is soft.     Tenderness: There is generalized abdominal tenderness (mild).     Comments: Multiple laparoscopic incisions noted and lower horizontal incision midline  Musculoskeletal:        General: Normal range of motion.     Cervical back: Normal range of motion and neck supple.  Skin:    General: Skin is warm and dry.  Neurological:     General: No focal deficit present.     Mental Status: He is alert.  Psychiatric:        Mood and Affect: Mood normal.        Behavior: Behavior normal.  Data Reviewed: Results for orders placed or performed during the hospital encounter of 01/17/24 (from the past 24 hours)  Glucose, capillary     Status: Abnormal   Collection Time: 01/20/24  5:37 PM  Result Value Ref Range   Glucose-Capillary 294 (H) 70 - 99 mg/dL  Glucose, capillary     Status: Abnormal   Collection Time: 01/20/24  9:20 PM  Result Value Ref Range   Glucose-Capillary 354 (H) 70 - 99 mg/dL  Hemoglobin     Status: Abnormal   Collection Time: 01/21/24  4:37 AM  Result Value Ref Range   Hemoglobin 9.2 (L) 13.0 - 17.0 g/dL  Glucose, capillary     Status: Abnormal   Collection Time: 01/21/24  7:42 AM  Result Value Ref Range   Glucose-Capillary 100 (H) 70 - 99 mg/dL  Glucose, capillary     Status: Abnormal   Collection Time: 01/21/24 11:59 AM  Result Value Ref Range   Glucose-Capillary 249 (H) 70 - 99 mg/dL     I have reviewed pertinent nursing notes, vitals, labs, and images as necessary. I have ordered labwork to follow up on as indicated.  I have reviewed the last notes from staff over past 24 hours. I have discussed patient's care plan and test results with nursing staff, CM/SW, and other staff as appropriate.  Time spent: Greater than 50% of the 55 minute visit was spent in counseling/coordination of care for the patient as laid out in the A&P.   LOS: 4 days   Alm Apo, [name] [facility] 01/21/2024, 2:35 PM

## 2024-01-22 DIAGNOSIS — E1122 Type 2 diabetes mellitus with diabetic chronic kidney disease: Secondary | ICD-10-CM | POA: Diagnosis not present

## 2024-01-22 DIAGNOSIS — I483 Typical atrial flutter: Secondary | ICD-10-CM | POA: Diagnosis not present

## 2024-01-22 DIAGNOSIS — C189 Malignant neoplasm of colon, unspecified: Secondary | ICD-10-CM | POA: Diagnosis not present

## 2024-01-22 DIAGNOSIS — Z794 Long term (current) use of insulin: Secondary | ICD-10-CM | POA: Diagnosis not present

## 2024-01-22 LAB — GLUCOSE, CAPILLARY
Glucose-Capillary: 114 mg/dL — ABNORMAL HIGH (ref 70–99)
Glucose-Capillary: 431 mg/dL — ABNORMAL HIGH (ref 70–99)
Glucose-Capillary: 302 mg/dL — ABNORMAL HIGH (ref 70–99)
Glucose-Capillary: 40 mg/dL — CL (ref 70–99)
Glucose-Capillary: 47 mg/dL — ABNORMAL LOW (ref 70–99)
Glucose-Capillary: 47 mg/dL — ABNORMAL LOW (ref 70–99)
Glucose-Capillary: 69 mg/dL — ABNORMAL LOW (ref 70–99)
Glucose-Capillary: 88 mg/dL (ref 70–99)
Glucose-Capillary: 131 mg/dL — ABNORMAL HIGH (ref 70–99)

## 2024-01-22 LAB — CREATININE, SERUM
Creatinine, Ser: 2.39 mg/dL — ABNORMAL HIGH (ref 0.61–1.24)
GFR, Estimated: 27 mL/min — ABNORMAL LOW (ref >60)

## 2024-01-22 LAB — GLUCOSE, RANDOM: Glucose, Bld: 304 mg/dL — ABNORMAL HIGH (ref 70–99)

## 2024-01-22 LAB — POTASSIUM: Potassium: 3.8 mmol/L (ref 3.5–5.1)

## 2024-01-22 MED ORDER — INSULIN ASPART 100 UNIT/ML IJ SOLN
20.0000 [IU] | INTRAMUSCULAR | Status: AC
  Administered 2024-01-22: 20 [IU] via SUBCUTANEOUS

## 2024-01-22 NOTE — Progress Notes (Signed)
 01/22/2024  Cory [name] [id number] [birth year]  CARE TEAM: PCP: [name], MD  Outpatient Care Team: Patient Care Team: [name], MD as PCP - General (Family Medicine) [name], MD as PCP - Cardiology (Cardiology) [name], MD as Referring Physician (Cardiology) [name], MD as Referring Physician (Geriatric Medicine) [name], [name], NP as Nurse Practitioner (Nurse Practitioner) [name], MD as Consulting Physician (Hematology and Oncology) [name], MD as Referring Physician (Surgical Oncology) [name], [name], MD as Consulting Physician (Cardiology) [name], MD (Family Medicine) [name], MD as Consulting Physician (General Surgery) [name], MD as Consulting Physician (Neurology) [name], [name], FNP as WOC Nurse (Nurse Practitioner) [name], [name], FNP as WOC Nurse (Nurse Practitioner)  Inpatient Treatment Team: Treatment Team:  [name], MD Patsy Lenis, MD [name], MD Jakie Shields, RN Haig Damien [name], RN [name], RN [name], [name] Zamudio-Aguilar, Breck MATSU, NT Ccs, Md, MD Ramirez-Flores, Erminio Ellie Alfonse SAUNDERS, RN   Problem List:   Principal Problem:   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[facility]) Active Problems:   Hyperlipidemia   Hypertension   Overweight (BMI 25.0-29.9)   Type 2 diabetes mellitus with chronic kidney disease, with long-term current use of insulin  (HCC)   Atrial flutter (HCC)   CKD (chronic kidney disease) stage 4, GFR 15-29 ml/min (HCC)   IDA (iron  deficiency anemia)   History of prostate cancer   Hx of right BKA (HCC)   HFrEF (heart failure with reduced ejection fraction) (HCC)   Depression   GERD (gastroesophageal reflux disease)   History of CVA (cerebrovascular accident)   01/17/2024  POST-OPERATIVE DIAGNOSIS:  PROXIMAL SIGMOID COLON CANCER   PROCEDURE:   -ROBOTIC SIGMOID COLECTOMY WITH ANASTOMOSIS -INTRAOPERATIVE ASSESSMENT OF  TISSUE VASCULAR PERFUSION USING ICG (indocyanine green ) IMMUNOFLUORESCENCE -TRANSVERSUS ABDOMINIS PLANE (TAP) BLOCK - BILATERAL -FLEXIBLE SIGMOIDOSCOPY   SURGEON:  [name], MD  OR FINDINGS:   Patient had proximal sigmoid colon cancer flopped up into the left upper quadrant.  No obvious metastatic disease on visceral parietal peritoneum or liver.   It is a 31mm EEA anastomosis ( mid descending colon connected to proximal rectum.)  It rests 14 cm from the anal verge by flexible sigmoidoscopy  Assessment [facility] Stay = 5 days) 5 Days Post-Op    Stable.  Awaiting discharge plan  Plan:  ERAS protocol  Follow-up on pathology notes pT3 pN0 adenocarcinoma.  49 lymph nodes sampled negative.  Stage II disease.  Update and told the patient bedside.  I think surgery is all he needs.  Given his advanced age and numerous health issues, would not be a great candidate for post adjuvant chemotherapy or treatment anyway.  I do think it is wise for him to follow-up at the cancer center for survival pathway a colonoscopy in a year.  Hopefully he will be able to be compliant with this.   Tolerating diet.  Bowel regimen with polycarbophil and nighttime Imodium  to help thicken and slow down stools.  He can have extra loperamide  as needed.  Poorly controlled diabetes with significant hyperglycemia.  Better controlled on Lantus .  Defer to medicine but looks like current regimen working okay  Hemoglobin stable for 48 hours =  okay to resume Eliquis  anticoagulation.  Hemoglobin stable so far.  -monitor electrolytes & replace as needed  Keep K>4, Mg>2, Phos>3  HTN control.  Blood pressure soft.  Wonder if he should hold his amlodipine .  Defer to medicine.  -  VTE prophylaxis- SCDs.  Anticoagulation prophyllaxis SQ as appropriate  -mobilize as tolerated to help recovery.  He has a BKA but claims that he ambulates well with his prosthesis.  I wrote for therapies to help evaluate him given his  numerous hospitalizations and somewhat deconditioned state.  He has told people he can get his prosthesis on by himself and then tells me he can.  Enlist therapies in moderate/high risk patients as appropriate  I updated the patient's status to the patient  Recommendations were made.  Questions were answered.  He expressed understanding & appreciation.  -Disposition: The patient feels like he will be fine totally by himself at home.  I noted that is not safe.  He is resistant to skilled nursing facility.  He went from saying he has no one at home to that his wife is coming in from another country and his niece may be able to help as well.  I noted when family is here and available and they confirmed that they can take care of him, perhaps he can go home Monday 9/15 with home health.  Otherwise I strongly recommend skilled nursing facility.  Hopefully he will not leave AMA    Disposition:  The patient is from: Home Anticipate discharge to:  Skilled Nursing Facility (SNF) Anticipated Date of Discharge is:  September 16,2025   Barriers to discharge:  Pending Clinical improvement (more likely than not), Therapy assessment & Recommendations pending, Testing result pending, and Consultant clearance & sign off    Patient currently is NOT MEDICALLY STABLE for discharge from the hospital from a surgery standpoint.      I reviewed nursing notes, hospitalist notes, last 24 h vitals and pain scores, last 48 h intake and output, last 24 h labs and trends, and last 24 h imaging results.  I have reviewed this patient's available data, including medical history, events of note, test results, etc as part of my evaluation.   A significant portion of that time was spent in counseling. Care during the described time interval was provided by me.  This care required moderate level of medical decision making.  01/22/2024    Subjective: (Chief complaint) Sitting up.  Feeling better.  More agreeable to  skilled nursing facility due to lack of family health and his need for assistance mobilizing  Objective:  Vital signs:  Vitals:   01/21/24 1508 01/21/24 2237 01/22/24 0500 01/22/24 0557  BP: 110/61 123/77  135/80  Pulse: 61 68  61  Resp: 16 16  16   Temp: 97.7 F (36.5 C) 98.4 F (36.9 C)  97.9 F (36.6 C)  TempSrc: Oral Oral  Oral  SpO2: 100% 100%  100%  Weight:   82.5 kg   Height:        Last BM Date : 01/18/24  Intake/Output   Yesterday:  09/14 0701 - 09/15 0700 In: 1723 [P.O.:1720; I.V.:3] Out: 1250 [Urine:1250] This shift:  No intake/output data recorded.  Bowel function:  Flatus: YES  BM:  YES  Drain: (No drain)   Physical Exam:  General: Pt resting to awaken awake/alert in no acute distress.   Eyes: PERRL, normal EOM.  Sclera clear.  No icterus Neuro: CN II-XII intact w/o focal sensory/motor deficits. Lymph: No head/neck/groin lymphadenopathy Psych:  No delerium/psychosis/paranoia.  Occasionally mildly confused but redirectable and at best oriented x 4 -this is his baseline HENT: Normocephalic, Mucus membranes moist.  No thrush.  Mumbling which is his baseline. Neck: Supple, No tracheal deviation.  No  obvious thyromegaly Chest: No pain to chest wall compression.  Good respiratory excursion.  No audible wheezing CV:  Pulses intact.  Regular rhythm.  No major extremity edema MS: Normal AROM mjr joints.  No obvious deformity  Abdomen: Soft.  Nondistended.  Nontender.  No evidence of peritonitis.  No incarcerated hernias.  Ext:  No deformity.  No mjr edema.  No cyanosis Skin: No petechiae / purpurea.  No major sores.  Warm and dry    Results:   Cultures: No results found for this or any previous visit (from the past 720 hours).  Labs: Results for orders placed or performed during the hospital encounter of 01/17/24 (from the past 48 hours)  Glucose, capillary     Status: Abnormal   Collection Time: 01/20/24 12:11 PM  Result Value Ref Range    Glucose-Capillary 131 (H) 70 - 99 mg/dL    Comment: Glucose reference range applies only to samples taken after fasting for at least 8 hours.  Glucose, capillary     Status: Abnormal   Collection Time: 01/20/24  5:37 PM  Result Value Ref Range   Glucose-Capillary 294 (H) 70 - 99 mg/dL    Comment: Glucose reference range applies only to samples taken after fasting for at least 8 hours.  Glucose, capillary     Status: Abnormal   Collection Time: 01/20/24  9:20 PM  Result Value Ref Range   Glucose-Capillary 354 (H) 70 - 99 mg/dL    Comment: Glucose reference range applies only to samples taken after fasting for at least 8 hours.  Hemoglobin     Status: Abnormal   Collection Time: 01/21/24  4:37 AM  Result Value Ref Range   Hemoglobin 9.2 (L) 13.0 - 17.0 g/dL    Comment: Performed at [facility], [address] [place], [place] [address]  Glucose, capillary     Status: Abnormal   Collection Time: 01/21/24  7:42 AM  Result Value Ref Range   Glucose-Capillary 100 (H) 70 - 99 mg/dL    Comment: Glucose reference range applies only to samples taken after fasting for at least 8 hours.  Glucose, capillary     Status: Abnormal   Collection Time: 01/21/24 11:59 AM  Result Value Ref Range   Glucose-Capillary 249 (H) 70 - 99 mg/dL    Comment: Glucose reference range applies only to samples taken after fasting for at least 8 hours.  Glucose, capillary     Status: Abnormal   Collection Time: 01/21/24  4:57 PM  Result Value Ref Range   Glucose-Capillary 213 (H) 70 - 99 mg/dL    Comment: Glucose reference range applies only to samples taken after fasting for at least 8 hours.  Glucose, capillary     Status: Abnormal   Collection Time: 01/21/24  9:15 PM  Result Value Ref Range   Glucose-Capillary 172 (H) 70 - 99 mg/dL    Comment: Glucose reference range applies only to samples taken after fasting for at least 8 hours.  Potassium     Status: None   Collection Time: 01/22/24   4:13 AM  Result Value Ref Range   Potassium 3.8 3.5 - 5.1 mmol/L    Comment: Performed at [facility], [address] [place], [place] [address]  Creatinine, serum     Status: Abnormal   Collection Time: 01/22/24  4:13 AM  Result Value Ref Range   Creatinine, Ser 2.39 (H) 0.61 - 1.24 mg/dL   GFR, Estimated 27 (L) >  60 mL/min    Comment: (NOTE) Calculated using the CKD-EPI Creatinine Equation (2021) Performed at [facility], [address] [place], [place] [address]   Glucose, capillary     Status: Abnormal   Collection Time: 01/22/24  7:34 AM  Result Value Ref Range   Glucose-Capillary 114 (H) 70 - 99 mg/dL    Comment: Glucose reference range applies only to samples taken after fasting for at least 8 hours.    Imaging / Studies: No results found.  Medications / Allergies: per chart  Antibiotics: Anti-infectives (From admission, onward)    Start     Dose/Rate Route Frequency Ordered Stop   01/17/24 2200  cefoTEtan  (CEFOTAN ) 2 g in sodium chloride  0.9 % 100 mL IVPB        2 g 200 mL/hr over 30 Minutes Intravenous Every 12 hours 01/17/24 1725 01/18/24 0832   01/17/24 1400  neomycin  (MYCIFRADIN ) tablet 1,000 mg  Status:  Discontinued       Placed in And Linked Group   1,000 mg Oral 3 times per day 01/17/24 1005 01/17/24 1013   01/17/24 1400  metroNIDAZOLE  (FLAGYL ) tablet 1,000 mg  Status:  Discontinued       Placed in And Linked Group   1,000 mg Oral 3 times per day 01/17/24 1005 01/17/24 1013   01/17/24 1015  cefoTEtan  (CEFOTAN ) 2 g in sodium chloride  0.9 % 100 mL IVPB        2 g 200 mL/hr over 30 Minutes Intravenous On call to O.R. 01/17/24 1005 01/17/24 1730         Note: Portions of this report may have been transcribed using voice recognition software. Every effort was made to ensure accuracy; however, inadvertent computerized transcription errors may be present.   Any transcriptional errors that result from this process  are unintentional.    Elspeth KYM Schultze, MD, FACS, MASCRS Esophageal, Gastrointestinal & Colorectal Surgery Robotic and Minimally Invasive Surgery  [facility] [address] [place], [place] [address] [phone] Fax [phone] Main  CONTACT INFORMATION: Weekday (9AM-5PM): Call [facility] main office at [phone] Weeknight (5PM-9AM) or Weekend/Holiday: Check EPIC Web Links tab & use AMION (password  [email or web address]) for General Surgery [facility] coverage  Please, DO NOT use SecureChat  (it is not reliable communication to reach operating surgeons & will lead to a delay in care).   Epic staff messaging available for outptient concerns needing 1-2 business day response.      01/22/2024  9:33 AM

## 2024-01-22 NOTE — Plan of Care (Signed)
  Problem: Education: Goal: Understanding of discharge needs will improve Outcome: Progressing Goal: Verbalization of understanding of the causes of altered bowel function will improve Outcome: Progressing   Problem: Activity: Goal: Ability to tolerate increased activity will improve Outcome: Progressing   Problem: Bowel/Gastric: Goal: Gastrointestinal status for postoperative course will improve Outcome: Progressing   Problem: Health Behavior/Discharge Planning: Goal: Identification of community resources to assist with postoperative recovery needs will improve Outcome: Progressing   Problem: Nutritional: Goal: Will attain and maintain optimal nutritional status will improve Outcome: Progressing   Problem: Clinical Measurements: Goal: Postoperative complications will be avoided or minimized Outcome: Progressing   Problem: Respiratory: Goal: Respiratory status will improve Outcome: Progressing   Problem: Skin Integrity: Goal: Will show signs of wound healing Outcome: Progressing   Problem: Education: Goal: Knowledge of General Education information will improve Description: Including pain rating scale, medication(s)/side effects and non-pharmacologic comfort measures Outcome: Progressing   Problem: Health Behavior/Discharge Planning: Goal: Ability to manage health-related needs will improve Outcome: Progressing   Problem: Clinical Measurements: Goal: Ability to maintain clinical measurements within normal limits will improve Outcome: Progressing Goal: Will remain free from infection Outcome: Progressing Goal: Diagnostic test results will improve Outcome: Progressing Goal: Respiratory complications will improve Outcome: Progressing Goal: Cardiovascular complication will be avoided Outcome: Progressing   Problem: Activity: Goal: Risk for activity intolerance will decrease Outcome: Progressing   Problem: Nutrition: Goal: Adequate nutrition will be  maintained Outcome: Progressing   Problem: Coping: Goal: Level of anxiety will decrease Outcome: Progressing   Problem: Elimination: Goal: Will not experience complications related to bowel motility Outcome: Progressing Goal: Will not experience complications related to urinary retention Outcome: Progressing   Problem: Pain Managment: Goal: General experience of comfort will improve and/or be controlled Outcome: Progressing   Problem: Safety: Goal: Ability to remain free from injury will improve Outcome: Progressing   Problem: Skin Integrity: Goal: Risk for impaired skin integrity will decrease Outcome: Progressing   Problem: Education: Goal: Ability to describe self-care measures that may prevent or decrease complications (Diabetes Survival Skills Education) will improve Outcome: Progressing Goal: Individualized Educational Video(s) Outcome: Progressing   Problem: Coping: Goal: Ability to adjust to condition or change in health will improve Outcome: Progressing   Problem: Fluid Volume: Goal: Ability to maintain a balanced intake and output will improve Outcome: Progressing   Problem: Health Behavior/Discharge Planning: Goal: Ability to identify and utilize available resources and services will improve Outcome: Progressing Goal: Ability to manage health-related needs will improve Outcome: Progressing   Problem: Metabolic: Goal: Ability to maintain appropriate glucose levels will improve Outcome: Progressing   Problem: Nutritional: Goal: Maintenance of adequate nutrition will improve Outcome: Progressing Goal: Progress toward achieving an optimal weight will improve Outcome: Progressing   Problem: Skin Integrity: Goal: Risk for impaired skin integrity will decrease Outcome: Progressing   Problem: Tissue Perfusion: Goal: Adequacy of tissue perfusion will improve Outcome: Progressing

## 2024-01-22 NOTE — TOC Progression Note (Signed)
 Transition of Care [facility]) - Progression Note    Patient Details  Name: Cory [name] MRN: [id number] Date of Birth: 31-Jul-1945  Transition of [facility]) CM/SW Contact  Alfonse JONELLE Rex, RN Phone Number: 01/22/2024, 3:32 PM  Clinical Narrative:   PT eval completed, recommendation for short term rehab/SNF, attending also recommending SNF. Met with patient at bedside to introduce role of TOC/NCM and review for dc planning, patient now agreeable to short term rehab/SNF. FL2 updated, faxed out for bed offers.  Call to patient's niece, [name], no answer, vm left with updated initiating SNF process. Will contact [name] with SNF bed offers when available.                       Expected Discharge Plan and Services         Expected Discharge Date: 01/19/24                                     Social Drivers of Health (SDOH) Interventions SDOH Screenings   Food Insecurity: Food Insecurity Present (01/17/2024)  Housing: High Risk (01/17/2024)  Transportation Needs: No Transportation Needs (01/17/2024)  Utilities: At Risk (01/17/2024)  Depression (PHQ2-9): Low Risk  (10/09/2023)  Social Connections: Moderately Integrated (01/17/2024)  Recent Concern: Social Connections - Moderately Isolated (10/19/2023)  Tobacco Use: Medium Risk (01/17/2024)    Readmission Risk Interventions    01/18/2024   12:27 PM  Readmission Risk Prevention Plan  Transportation Screening Complete  Medication Review (RN Care Manager) Complete  PCP or Specialist appointment within 3-5 days of discharge Complete  HRI or Home Care Consult Complete  SW Recovery Care/Counseling Consult Complete  Palliative Care Screening Not Applicable  Skilled Nursing Facility Complete

## 2024-01-22 NOTE — Progress Notes (Signed)
 Mobility Specialist - Progress Note   01/22/24 1530  Mobility  Activity Ambulated with assistance  Level of Assistance Standby assist, set-up cues, supervision of patient - no hands on  Assistive Device Front wheel walker  Distance Ambulated (ft) 500 ft  Range of Motion/Exercises Active  Activity Response Tolerated well  Mobility Referral Yes  Mobility visit 1 Mobility  Mobility Specialist Start Time (ACUTE ONLY) 1510  Mobility Specialist Stop Time (ACUTE ONLY) 1530  Mobility Specialist Time Calculation (min) (ACUTE ONLY) 20 min   Pt was found in bed and agreeable to ambulate. No complaints. At EOS returned to recliner chair with all needs met. Call bell in reach and chair alarm on.   Erminio Leos,  Mobility Specialist Can be reached via Secure Chat

## 2024-01-22 NOTE — [place] FL2 (Signed)
 [place]  MEDICAID FL2 LEVEL OF CARE FORM     IDENTIFICATION  Patient Name: Cory [name] Birthdate: Dec 01, 1945 Sex: male Admission Date (Current Location): 01/17/2024  [facility] and [place] Number:  Producer, television/film/video and Address:  [facility],  [address], [place] [address]      Provider Number: [id number]  Attending Physician Name and Address:  [name], MD  Relative Name and Phone Number:  RAY CRAMP (Niece)  [phone] [facility])    Current Level of Care: Hospital Recommended Level of Care: Skilled Nursing Facility Prior Approval Number:    Date Approved/Denied:   PASRR Number: [id number]  Discharge Plan: SNF    Current Diagnoses: Patient Active Problem List   Diagnosis Date Noted   Depression 01/17/2024   GERD (gastroesophageal reflux disease) 01/17/2024   History of CVA (cerebrovascular accident) 01/17/2024   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(HCC) 01/17/2024   Stroke (HCC) 11/21/2023   Syncope and collapse 10/19/2023   HFrEF (heart failure with reduced ejection fraction) (HCC) 09/25/2023   Iron  deficiency anemia 09/21/2023   Left heart failure (HCC) 09/21/2023   Left ventricular failure (HCC) 09/21/2023   Pressure injury of skin 09/21/2023   Acute encephalopathy 09/18/2023   CKD (chronic kidney disease) stage 4, GFR 15-29 ml/min (HCC) 08/14/2023   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   IDA (iron  deficiency anemia) 08/14/2023   Decubitus ulcer of sacral region, stage 2 (HCC) 08/14/2023   H/O urinary incontinence 08/14/2023   AMS (altered mental status) 05/09/2023   Chronic anticoagulation 05/08/2023   History of prostate cancer 04/26/2023   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   Renal insufficiency 04/26/2023   Syncope 02/16/2023   Atrial flutter (HCC) 12/27/2022   UTI (urinary tract infection) 12/27/2022   Acute metabolic encephalopathy 12/27/2022   Elevated troponin 12/27/2022   Elevated lipase  12/27/2022   Generalized weakness 12/27/2022   Type 2 diabetes mellitus with chronic kidney disease, with long-term current use of insulin  (HCC) 12/26/2022   AKI (acute kidney injury) (HCC) 12/26/2022   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/02/2021   Moderate protein-calorie malnutrition (HCC)    Septic shock (HCC) 11/27/2020   Overweight (BMI 25.0-29.9) 11/27/2020   Hyperlipidemia 11/26/2020   Hyperglycemia 11/26/2020   Hypertension 11/26/2020    Orientation RESPIRATION BLADDER Height & Weight     Self, Time, Situation, Place  Normal Continent Weight: 82.5 kg Height:  5' 11 (180.3 cm)  BEHAVIORAL SYMPTOMS/MOOD NEUROLOGICAL BOWEL NUTRITION STATUS      Continent Diet (Carb modified)  AMBULATORY STATUS COMMUNICATION OF NEEDS Skin   Limited Assist Verbally Surgical wounds (Abdominal w/skin glue)                       Personal Care Assistance Level of Assistance  Bathing, Feeding, Dressing Bathing Assistance: Limited assistance Feeding assistance: Limited assistance Dressing Assistance: Limited assistance     Functional Limitations Info  Sight, Hearing, Speech Sight Info: Adequate Hearing Info: Adequate Speech Info: Adequate    SPECIAL CARE FACTORS FREQUENCY  PT (By licensed PT), OT (By licensed OT)     PT Frequency: 5x/wk OT Frequency: 5x/wk            Contractures Contractures Info: Not present    Additional Factors Info  Code Status, Allergies, Psychotropic Code Status Info: Full Code Allergies Info: Fish Allergy, Pork-derived Products, Shellfish Allergy, Pumpkin Seed Oil Psychotropic Info: Zoloft  50mg  po daily  Current Medications (01/22/2024):  This is the current hospital active medication list Current Facility-Administered Medications  Medication Dose Route Frequency Provider Last Rate Last Admin   0.9 %  sodium chloride  infusion  250 mL Intravenous PRN [name], MD       acetaminophen  (TYLENOL ) tablet 1,000 mg  1,000 mg  Oral Q6H Gross, Steven, MD   1,000 mg at 01/22/24 0618   alum & mag hydroxide-simeth (MAALOX/MYLANTA) 200-200-20 MG/5ML suspension 30 mL  30 mL Oral Q6H PRN [name], MD       apixaban  (ELIQUIS ) tablet 5 mg  5 mg Oral BID [name], MD   5 mg at 01/22/24 0940   atorvastatin  (LIPITOR) tablet 20 mg  20 mg Oral QHS Gross, Steven, MD   20 mg at 01/21/24 2116   carvedilol  (COREG ) tablet 25 mg  25 mg Oral BID WC [name], MD   25 mg at 01/22/24 9060   diphenhydrAMINE  (BENADRYL ) 12.5 MG/5ML elixir 12.5 mg  12.5 mg Oral Q6H PRN [name], MD       Or   diphenhydrAMINE  (BENADRYL ) injection 12.5 mg  12.5 mg Intravenous Q6H PRN [name], MD       feeding supplement (GLUCERNA SHAKE) (GLUCERNA SHAKE) liquid 237 mL  237 mL Oral BID BM [name], MD   237 mL at 01/21/24 1234   gabapentin  (NEURONTIN ) capsule 200 mg  200 mg Oral QHS Gross, Steven, MD   200 mg at 01/21/24 2116   hydrALAZINE  (APRESOLINE ) injection 10 mg  10 mg Intravenous Q2H PRN [name], MD       HYDROmorphone  (DILAUDID ) injection 0.5-2 mg  0.5-2 mg Intravenous Q4H PRN [name], MD   1 mg at 01/18/24 0404   insulin  aspart (novoLOG ) injection 0-15 Units  0-15 Units Subcutaneous TID AC & HS Patsy Lenis, MD   11 Units at 01/22/24 1151   insulin  aspart (novoLOG ) injection 6 Units  6 Units Subcutaneous TID BUREN Patsy Lenis, MD   6 Units at 01/22/24 1306   insulin  glargine (LANTUS ) injection 20 Units  20 Units Subcutaneous Daily Patsy Lenis, MD   20 Units at 01/22/24 0940   loperamide  (IMODIUM ) capsule 2 mg  2 mg Oral QHS Gross, Steven, MD   2 mg at 01/21/24 2119   loperamide  (IMODIUM ) capsule 2 mg  2 mg Oral Q12H PRN [name], MD       magic mouthwash  15 mL Oral QID PRN [name], MD       melatonin tablet 3 mg  3 mg Oral QHS PRN [name], MD   3 mg at 01/18/24 2114   menthol -cetylpyridinium (CEPACOL) lozenge 3 mg  1 lozenge Oral PRN [name], MD       metoprolol  tartrate (LOPRESSOR )  injection 5 mg  5 mg Intravenous Q6H PRN [name], MD       metoprolol  tartrate (LOPRESSOR ) injection 5 mg  5 mg Intravenous Q2H PRN [name], MD       naphazoline-glycerin  (CLEAR EYES REDNESS) ophth solution 1-2 drop  1-2 drop Both Eyes QID PRN [name], MD       ondansetron  (ZOFRAN ) tablet 4 mg  4 mg Oral Q6H PRN [name], MD       Or   ondansetron  (ZOFRAN ) injection 4 mg  4 mg Intravenous Q6H PRN [name], MD   4 mg at 01/18/24 0403   pantoprazole  (PROTONIX ) EC tablet 40 mg  40 mg Oral Daily [name], MD  40 mg at 01/22/24 0938   phenol (CHLORASEPTIC) mouth spray 2 spray  2 spray Mouth/Throat PRN [name], MD       polycarbophil (FIBERCON) tablet 625 mg  625 mg Oral BID [name], MD   625 mg at 01/22/24 9060   prochlorperazine  (COMPAZINE ) tablet 10 mg  10 mg Oral Q6H PRN [name], MD       Or   prochlorperazine  (COMPAZINE ) injection 5-10 mg  5-10 mg Intravenous Q6H PRN [name], MD       sertraline  (ZOLOFT ) tablet 50 mg  50 mg Oral Daily [name], MD   50 mg at 01/22/24 9060   simethicone  (MYLICON) chewable tablet 40 mg  40 mg Oral Q6H PRN [name], MD       sodium chloride  (OCEAN) 0.65 % nasal spray 1-2 spray  1-2 spray Each Nare Q6H PRN [name], MD       sodium chloride  flush (NS) 0.9 % injection 3 mL  3 mL Intravenous Q12H Gross, Steven, MD   3 mL at 01/22/24 0940   sodium chloride  flush (NS) 0.9 % injection 3 mL  3 mL Intravenous PRN [name], MD       spironolactone  (ALDACTONE ) tablet 100 mg  100 mg Oral Daily [name], MD   100 mg at 01/22/24 9060   tamsulosin  (FLOMAX ) capsule 0.4 mg  0.4 mg Oral Daily [name], MD   0.4 mg at 01/22/24 9060   torsemide  (DEMADEX ) tablet 20 mg  20 mg Oral Daily [name], MD   20 mg at 01/22/24 9060   traMADol  (ULTRAM ) tablet 50-100 mg  50-100 mg Oral Q12H PRN [name], MD   100 mg at 01/19/24 2144   Vitamin D  (Ergocalciferol ) (DRISDOL ) 1.25 MG (50000 UNIT) capsule  50,000 Units  50,000 Units Oral Weekly [name], MD   50,000 Units at 01/21/24 9071     Discharge Medications: Please see discharge summary for a list of discharge medications.  Relevant Imaging Results:  Relevant Lab Results:   Additional Information SSN: [id number]  Alfonse JONELLE Rex, RN

## 2024-01-22 NOTE — Progress Notes (Signed)
 Consult Progress Note    Maclovio Henson   FMW:[id number]  DOB: 06/21/1945  DOA: 01/17/2024     5 PCP: [name], MD  Requesting provider: Dr. Sheldon Reason for consultation: medical management   Hospital Course: Mr. Arroyave is a 78 year old male with PMH A-flutter, DM II, recent incidental CVA June 2025, CKD 3B, HTN, HLD, IDA. He was diagnosed with colon cancer in December 2024 and before able to schedule surgery he has had recurrent illnesses, hospitalizations, and recent stroke in June.  He has been able to schedule surgery recently and underwent clearance with neurology prior to surgery. He underwent robotic assisted sigmoid colectomy with anastomosis on 01/17/2024. We are asked to help assist with medical management postop.  Interval History:  No events overnight.  Glucose levels became elevated later this morning.  Giving some extra NovoLog .  They have been relatively controlled otherwise.  He seems more amenable today with going to rehab.  Assessment and Plan: * 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[facility]) - seen by Dr. Lanny 07/19/23; initially diagnosed 04/19/23 and had undergone evaluation with [facility] surgery back then but had some hospitalizations preventing surgery then stroke in June but now has finally undergone surgical intervention - per PET in Dec 2024: Hypermetabolic proximal descending colon mass. No evidence of locoregional or distant metastatic disease.  -Underwent surgical resection on 01/17/2024 - Path has returned back with invasive moderately differentiated adenocarcinoma -Further postop management per general surgery, primary team; patient going to be referred to oncology for follow-up - Bowel function has returned and diet has been advanced - Having some stool incontinence in bed.  Imodium  added for surgery  Atrial flutter (HCC) - continue eliquis  and coreg  - Remains rate controlled - PRN lopressor    HFrEF (heart failure with reduced ejection fraction) (HCC) - last echo  09/19/23: EF 25 to 30%, global hypokinesis, mild LVH, indeterminate diastolic parameters -Currently no signs/symptoms exacerbation - fluid status to be monitored postop - Follows with cardiology, last seen 10/12/2023 - Maintained outpatient on Coreg , Norvasc , BiDil , telmisartan , torsemide  - Some soft blood pressures today: Currently on amlodipine , Coreg , spironolactone , and torsemide  - no bidil  on med rec; will have pharmacy clarify  - Not a candidate for SGLT2 due to history of UTI per cardiology  Type 2 diabetes mellitus with chronic kidney disease, with long-term current use of insulin  (HCC) - Last A1c 10.5% on 01/05/2024 - Lantus  dose increased on 01/21/2024 - continue Lantus  and SSI - Some elevation noted this morning, not sure if ate something out of ordinary as levels had been previously stable for the most part; giving extra NovoLog  to bring back down  CKD (chronic kidney disease) stage 4, GFR 15-29 ml/min (HCC) - patient has history of CKD3b. Baseline creat ~ 2.2 - 2.5, eGFR~ 22-25 - stable at baseline  - BMP as necessary - diet advanced to soft   History of CVA (cerebrovascular accident) - Recent CVA 10/19/2023 with MRI brain showing small acute left parietal infarcts; found incidentally when he was undergoing workup for hypothermia and hypoglycemia during that hospitalization - Continued on Eliquis  and statin at discharge - He has been seen by neurology in follow-up in July 2025  GERD (gastroesophageal reflux disease) - Continue Protonix   Depression - Continue Zoloft   Hx of right BKA (HCC) - hx OM with R BKA 2022  IDA (iron  deficiency anemia) - Baseline hemoglobin around 10 to 11 g/dL - Hgb stable; mild downtrend postop but no obvious signs of bleeding  Overweight (BMI 25.0-29.9) - Complicates  overall prognosis and care - Body mass index is 25.8 kg/m.  Hypertension - Outpatient regimen: On amlodipine , Coreg , telmisartan , torsemide , spironolactone  - await med rec to  clarify bidil  - Soft blood pressures noted this morning on current regimen.  Will monitor further over the next 24 hours and make adjustments if necessary  Hyperlipidemia - Continue Lipitor   Old records reviewed in assessment of this patient  Antimicrobials:   DVT prophylaxis:  SCD's Start: 01/17/24 1726 apixaban  (ELIQUIS ) tablet 5 mg   Code Status:   Code Status: Full Code  Mobility Assessment (Last 72 Hours)     Mobility Assessment     Row Name 01/22/24 0900 01/21/24 1940 01/21/24 0850 01/20/24 2004 01/20/24 1000   Does the patient have exclusion criteria? No - Perform mobility assessment No - Perform mobility assessment No - Perform mobility assessment No - Perform mobility assessment No - Perform mobility assessment   What is the highest level of mobility based on the mobility assessment? Level 3 (Stands with assistance) - Balance while standing  and cannot march in place Level 3 (Stands with assistance) - Balance while standing  and cannot march in place Level 3 (Stands with assistance) - Balance while standing  and cannot march in place Level 4 (Ambulates with assistance) - Balance while stepping forward/back - Complete --   Is the above level different from baseline mobility prior to current illness? Yes - Recommend PT order Yes - Recommend PT order Yes - Recommend PT order Yes - Recommend PT order Yes - Recommend PT order    Row Name 01/19/24 2140           Does the patient have exclusion criteria? No - Perform mobility assessment       What is the highest level of mobility based on the mobility assessment? Level 4 (Ambulates with assistance) - Balance while stepping forward/back - Complete       Is the above level different from baseline mobility prior to current illness? Yes - Recommend PT order         Objective: Blood pressure 135/80, pulse 61, temperature 97.9 F (36.6 C), temperature source Oral, resp. rate 16, height 5' 11 (1.803 m), weight 82.5 kg, SpO2 100%.   Examination:  Physical Exam Constitutional:      General: He is not in acute distress.    Appearance: Normal appearance.  HENT:     Head: Normocephalic and atraumatic.     Mouth/Throat:     Mouth: Mucous membranes are moist.  Eyes:     Extraocular Movements: Extraocular movements intact.  Cardiovascular:     Rate and Rhythm: Normal rate and regular rhythm.  Pulmonary:     Effort: Pulmonary effort is normal. No respiratory distress.     Breath sounds: Normal breath sounds. No wheezing.  Abdominal:     General: Bowel sounds are normal. There is no distension.     Palpations: Abdomen is soft.     Tenderness: There is no abdominal tenderness.     Comments: Multiple laparoscopic incisions noted and lower horizontal incision midline  Musculoskeletal:        General: Normal range of motion.     Cervical back: Normal range of motion and neck supple.  Skin:    General: Skin is warm and dry.  Neurological:     General: No focal deficit present.     Mental Status: He is alert.  Psychiatric:        Mood and Affect: Mood  normal.        Behavior: Behavior normal.      Data Reviewed: Results for orders placed or performed during the hospital encounter of 01/17/24 (from the past 24 hours)  Glucose, capillary     Status: Abnormal   Collection Time: 01/21/24  4:57 PM  Result Value Ref Range   Glucose-Capillary 213 (H) 70 - 99 mg/dL  Glucose, capillary     Status: Abnormal   Collection Time: 01/21/24  9:15 PM  Result Value Ref Range   Glucose-Capillary 172 (H) 70 - 99 mg/dL  Potassium     Status: None   Collection Time: 01/22/24  4:13 AM  Result Value Ref Range   Potassium 3.8 3.5 - 5.1 mmol/L  Creatinine, serum     Status: Abnormal   Collection Time: 01/22/24  4:13 AM  Result Value Ref Range   Creatinine, Ser 2.39 (H) 0.61 - 1.24 mg/dL   GFR, Estimated 27 (L) >60 mL/min  Glucose, capillary     Status: Abnormal   Collection Time: 01/22/24  7:34 AM  Result Value Ref Range    Glucose-Capillary 114 (H) 70 - 99 mg/dL  Glucose, capillary     Status: Abnormal   Collection Time: 01/22/24 11:15 AM  Result Value Ref Range   Glucose-Capillary 431 (H) 70 - 99 mg/dL    I have reviewed pertinent nursing notes, vitals, labs, and images as necessary. I have ordered labwork to follow up on as indicated.  I have reviewed the last notes from staff over past 24 hours. I have discussed patient's care plan and test results with nursing staff, CM/SW, and other staff as appropriate.  Time spent: Greater than 50% of the 55 minute visit was spent in counseling/coordination of care for the patient as laid out in the A&P.   LOS: 5 days   Alm Apo, MD [facility] 01/22/2024, 12:31 PM

## 2024-01-23 DIAGNOSIS — C189 Malignant neoplasm of colon, unspecified: Secondary | ICD-10-CM | POA: Diagnosis not present

## 2024-01-23 DIAGNOSIS — Z794 Long term (current) use of insulin: Secondary | ICD-10-CM | POA: Diagnosis not present

## 2024-01-23 DIAGNOSIS — I483 Typical atrial flutter: Secondary | ICD-10-CM | POA: Diagnosis not present

## 2024-01-23 DIAGNOSIS — E1122 Type 2 diabetes mellitus with diabetic chronic kidney disease: Secondary | ICD-10-CM | POA: Diagnosis not present

## 2024-01-23 LAB — GLUCOSE, CAPILLARY
Glucose-Capillary: 133 mg/dL — ABNORMAL HIGH (ref 70–99)
Glucose-Capillary: 243 mg/dL — ABNORMAL HIGH (ref 70–99)
Glucose-Capillary: 283 mg/dL — ABNORMAL HIGH (ref 70–99)
Glucose-Capillary: 236 mg/dL — ABNORMAL HIGH (ref 70–99)

## 2024-01-23 LAB — CREATININE, SERUM
Creatinine, Ser: 2.75 mg/dL — ABNORMAL HIGH (ref 0.61–1.24)
GFR, Estimated: 23 mL/min — ABNORMAL LOW (ref >60)

## 2024-01-23 LAB — SURGICAL PATHOLOGY

## 2024-01-23 LAB — POTASSIUM: Potassium: 4 mmol/L (ref 3.5–5.1)

## 2024-01-23 MED ORDER — INSULIN ASPART 100 UNIT/ML IJ SOLN
0.0000 [IU] | Freq: Three times a day (TID) | INTRAMUSCULAR | Status: DC
  Administered 2024-01-23: 5 [IU] via SUBCUTANEOUS
  Administered 2024-01-23: 3 [IU] via SUBCUTANEOUS
  Administered 2024-01-24: 7 [IU] via SUBCUTANEOUS
  Administered 2024-01-24: 3 [IU] via SUBCUTANEOUS

## 2024-01-23 MED ORDER — INSULIN ASPART 100 UNIT/ML IJ SOLN
4.0000 [IU] | Freq: Three times a day (TID) | INTRAMUSCULAR | Status: DC
  Administered 2024-01-23 – 2024-01-24 (×2): 4 [IU] via SUBCUTANEOUS

## 2024-01-23 MED ORDER — INSULIN ASPART 100 UNIT/ML IJ SOLN: 0.0000 [IU] | Freq: Three times a day (TID) | INTRAMUSCULAR | Status: DC

## 2024-01-23 NOTE — Care Management Important Message (Signed)
 Important Message  Patient Details IM Letter given  Name: Cory [name] MRN: [id number] Date of Birth: August 09, 1945   Important Message Given:  Yes - Medicare IM     [place] 01/23/2024, 10:10 AM

## 2024-01-23 NOTE — TOC Progression Note (Addendum)
 Transition of Care [facility]) - Progression Note    Patient Details  Name: Cory [name] MRN: [id number] Date of Birth: June 09, 1945  [facility] CM/SW Contact  Alfonse JONELLE Rex, RN Phone Number: 01/23/2024, 3:01 PM  Clinical Narrative:   Call to patient's niece, [name], to review SNF bed offers[facility] [name]). Reviewed with [name] patient is in his co-pay days. Provided [name] with main facility number to [facility] ( last SNF patient was admitted), also states she has a relative who has knowledge of health insurance. Await call back from [place].   -3:14pm Call received from [name], pt's niece, reports they would like for patient to return to [place], states they may put a cap on/determine themselves how may days patient will stay at facility. NCM called to [facility], admit coordinator, [place], vm left with NCM name and phone number requesting call back,                        Expected Discharge Plan and Services         Expected Discharge Date: 01/23/24                                     Social Drivers of Health (SDOH) Interventions SDOH Screenings   Food Insecurity: Food Insecurity Present (01/17/2024)  Housing: High Risk (01/17/2024)  Transportation Needs: No Transportation Needs (01/17/2024)  Utilities: At Risk (01/17/2024)  Depression (PHQ2-9): Low Risk  (10/09/2023)  Social Connections: Moderately Integrated (01/17/2024)  Recent Concern: Social Connections - Moderately Isolated (10/19/2023)  Tobacco Use: Medium Risk (01/17/2024)    Readmission Risk Interventions    01/18/2024   12:27 PM  Readmission Risk Prevention Plan  Transportation Screening Complete  Medication Review (RN Care Manager) Complete  PCP or Specialist appointment within 3-5 days of discharge Complete  HRI or Home Care Consult Complete  SW Recovery Care/Counseling Consult Complete  Palliative Care Screening Not Applicable  Skilled Nursing Facility Complete

## 2024-01-23 NOTE — Plan of Care (Signed)
  Problem: Activity: Goal: Ability to tolerate increased activity will improve Outcome: Progressing   Problem: Bowel/Gastric: Goal: Gastrointestinal status for postoperative course will improve Outcome: Progressing   Problem: Health Behavior/Discharge Planning: Goal: Identification of community resources to assist with postoperative recovery needs will improve Outcome: Progressing   Problem: Education: Goal: Knowledge of General Education information will improve Description: Including pain rating scale, medication(s)/side effects and non-pharmacologic comfort measures Outcome: Progressing   Problem: Health Behavior/Discharge Planning: Goal: Ability to manage health-related needs will improve Outcome: Progressing   Problem: Clinical Measurements: Goal: Ability to maintain clinical measurements within normal limits will improve Outcome: Progressing   Problem: Pain Managment: Goal: General experience of comfort will improve and/or be controlled Outcome: Progressing   Problem: Safety: Goal: Ability to remain free from injury will improve Outcome: Progressing

## 2024-01-23 NOTE — Progress Notes (Signed)
 Consult Progress Note    Cory [name]   FMW:[id number]  DOB: November 01, 1945  DOA: 01/17/2024     6 PCP: [name], MD  Requesting provider: Dr. Sheldon Reason for consultation: medical management   Hospital Course: Mr. Cory [name] is a 78 year old male with PMH A-flutter, DM II, recent incidental CVA June 2025, CKD 3B, HTN, HLD, IDA. He was diagnosed with colon cancer in December 2024 and before able to schedule surgery he has had recurrent illnesses, hospitalizations, and recent stroke in June.  He has been able to schedule surgery recently and underwent clearance with neurology prior to surgery. He underwent robotic assisted sigmoid colectomy with anastomosis on 01/17/2024. We are asked to help assist with medical management postop.  Interval History:  No events overnight.  Had some hypoglycemia yesterday after extra insulin  doses.  Doing better today.  Remains medically stable.  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 Lanny 07/19/23; initially diagnosed 04/19/23 and had undergone evaluation with [facility] surgery back then but had some hospitalizations preventing surgery then stroke in June but now has finally undergone surgical intervention - per PET in Dec 2024: Hypermetabolic proximal descending colon mass. No evidence of locoregional or distant metastatic disease.  -Underwent surgical resection on 01/17/2024 - Path has returned back with invasive moderately differentiated adenocarcinoma -Further postop management per general surgery, primary team; patient going to be referred to oncology for follow-up - Bowel function has returned and diet has been advanced  Atrial flutter (HCC) - continue eliquis  and coreg  - Remains rate controlled - PRN lopressor    HFrEF (heart failure with reduced ejection fraction) (HCC) - last echo 09/19/23: EF 25 to 30%, global hypokinesis, mild LVH, indeterminate diastolic parameters -Currently no signs/symptoms exacerbation - fluid  status to be monitored postop - Follows with cardiology, last seen 10/12/2023 - Maintained outpatient on Coreg , Norvasc , BiDil , telmisartan , torsemide  - Some soft blood pressures today: Currently on amlodipine , Coreg , spironolactone , and torsemide  - no bidil  on med rec; will have pharmacy clarify  - Not a candidate for SGLT2 due to history of UTI per cardiology  Type 2 diabetes mellitus with chronic kidney disease, with long-term current use of insulin  (HCC) - Last A1c 10.5% on 01/05/2024 - Lantus  dose increased on 01/21/2024 - continue Lantus  and SSI - Glucose level in the 400s on 01/22/2024; insulin  adjusted followed by hypoglycemia; SSI adjusted; continue basal dose  CKD (chronic kidney disease) stage 4, GFR 15-29 ml/min (HCC) - patient has history of CKD3b. Baseline creat ~ 2.2 - 2.5, eGFR~ 22-25 - stable at baseline  - BMP as needed  History of CVA (cerebrovascular accident) - Recent CVA 10/19/2023 with MRI brain showing small acute left parietal infarcts; found incidentally when he was undergoing workup for hypothermia and hypoglycemia during that hospitalization - Continued on Eliquis  and statin at discharge - He has been seen by neurology in follow-up in July 2025  GERD (gastroesophageal reflux disease) - Continue Protonix   Depression - Continue Zoloft   Hx of right BKA (HCC) - hx OM with R BKA 2022  IDA (iron  deficiency anemia) - Baseline hemoglobin around 10 to 11 g/dL - Hgb stable; mild downtrend postop but no obvious signs of bleeding  Overweight (BMI 25.0-29.9) - Complicates overall prognosis and care - Body mass index is 25.8 kg/m.  Hypertension - Outpatient regimen: On amlodipine , Coreg , telmisartan , torsemide , spironolactone  - await med rec to clarify bidil   Hyperlipidemia - Continue Lipitor   Old records reviewed in assessment of this patient  Antimicrobials:   DVT prophylaxis:  SCD's Start: 01/17/24 1726 apixaban  (ELIQUIS ) tablet 5 mg   Code  Status:   Code Status: Full Code  Mobility Assessment (Last 72 Hours)     Mobility Assessment     Row Name 01/23/24 1100 01/22/24 1945 01/22/24 0900 01/21/24 1940 01/21/24 0850   Does the patient have exclusion criteria? No - Perform mobility assessment No - Perform mobility assessment No - Perform mobility assessment No - Perform mobility assessment No - Perform mobility assessment   What is the highest level of mobility based on the mobility assessment? Level 3 (Stands with assistance) - Balance while standing  and cannot march in place Level 3 (Stands with assistance) - Balance while standing  and cannot march in place Level 3 (Stands with assistance) - Balance while standing  and cannot march in place Level 3 (Stands with assistance) - Balance while standing  and cannot march in place Level 3 (Stands with assistance) - Balance while standing  and cannot march in place   Is the above level different from baseline mobility prior to current illness? Yes - Recommend PT order Yes - Recommend PT order Yes - Recommend PT order Yes - Recommend PT order Yes - Recommend PT order    Row Name 01/20/24 2004           Does the patient have exclusion criteria? No - Perform mobility assessment       What is the highest level of mobility based on the mobility assessment? Level 4 (Ambulates with assistance) - Balance while stepping forward/back - Complete       Is the above level different from baseline mobility prior to current illness? Yes - Recommend PT order         Objective: Blood pressure 100/67, pulse 72, temperature 98 F (36.7 C), resp. rate 18, height 5' 11 (1.803 m), weight 82.5 kg, SpO2 100%.  Examination:  Physical Exam Constitutional:      General: He is not in acute distress.    Appearance: Normal appearance.  HENT:     Head: Normocephalic and atraumatic.     Mouth/Throat:     Mouth: Mucous membranes are moist.  Eyes:     Extraocular Movements: Extraocular movements intact.   Cardiovascular:     Rate and Rhythm: Normal rate and regular rhythm.  Pulmonary:     Effort: Pulmonary effort is normal. No respiratory distress.     Breath sounds: Normal breath sounds. No wheezing.  Abdominal:     General: Bowel sounds are normal. There is no distension.     Palpations: Abdomen is soft.     Tenderness: There is no abdominal tenderness.     Comments: Multiple laparoscopic incisions noted and lower horizontal incision midline  Musculoskeletal:        General: Normal range of motion.     Cervical back: Normal range of motion and neck supple.  Skin:    General: Skin is warm and dry.  Neurological:     General: No focal deficit present.     Mental Status: He is alert.  Psychiatric:        Mood and Affect: Mood normal.        Behavior: Behavior normal.      Data Reviewed: Results for orders placed or performed during the hospital encounter of 01/17/24 (from the past 24 hours)  Glucose, capillary     Status: Abnormal   Collection Time: 01/22/24  4:12 PM  Result Value  Ref Range   Glucose-Capillary 40 (LL) 70 - 99 mg/dL   Comment 1 Notify RN   Glucose, capillary     Status: Abnormal   Collection Time: 01/22/24  4:13 PM  Result Value Ref Range   Glucose-Capillary 47 (L) 70 - 99 mg/dL  Glucose, capillary     Status: Abnormal   Collection Time: 01/22/24  4:54 PM  Result Value Ref Range   Glucose-Capillary 47 (L) 70 - 99 mg/dL  Glucose, capillary     Status: Abnormal   Collection Time: 01/22/24  5:23 PM  Result Value Ref Range   Glucose-Capillary 69 (L) 70 - 99 mg/dL  Glucose, capillary     Status: None   Collection Time: 01/22/24  6:13 PM  Result Value Ref Range   Glucose-Capillary 88 70 - 99 mg/dL  Glucose, capillary     Status: Abnormal   Collection Time: 01/22/24  9:18 PM  Result Value Ref Range   Glucose-Capillary 131 (H) 70 - 99 mg/dL  Potassium     Status: None   Collection Time: 01/23/24  4:27 AM  Result Value Ref Range   Potassium 4.0 3.5 - 5.1  mmol/L  Creatinine, serum     Status: Abnormal   Collection Time: 01/23/24  4:27 AM  Result Value Ref Range   Creatinine, Ser 2.75 (H) 0.61 - 1.24 mg/dL   GFR, Estimated 23 (L) >60 mL/min  Glucose, capillary     Status: Abnormal   Collection Time: 01/23/24  8:17 AM  Result Value Ref Range   Glucose-Capillary 133 (H) 70 - 99 mg/dL  Glucose, capillary     Status: Abnormal   Collection Time: 01/23/24 11:38 AM  Result Value Ref Range   Glucose-Capillary 243 (H) 70 - 99 mg/dL    I have reviewed pertinent nursing notes, vitals, labs, and images as necessary. I have ordered labwork to follow up on as indicated.  I have reviewed the last notes from staff over past 24 hours. I have discussed patient's care plan and test results with nursing staff, CM/SW, and other staff as appropriate.  Time spent: Greater than 50% of the 55 minute visit was spent in counseling/coordination of care for the patient as laid out in the A&P.   LOS: 6 days   Alm Apo, MD [facility] 01/23/2024, 2:13 PM

## 2024-01-23 NOTE — Progress Notes (Signed)
 01/23/2024  Cory [name] [id number] [birth year]  CARE TEAM: PCP: [name], MD  Outpatient Care Team: Patient Care Team: [name], MD as PCP - General (Family Medicine) [name], MD as PCP - Cardiology (Cardiology) [name], MD as Referring Physician (Cardiology) [name], MD as Referring Physician (Geriatric Medicine) [name], [name], NP as Nurse Practitioner (Nurse Practitioner) [name], MD as Consulting Physician (Hematology and Oncology) [name], MD as Referring Physician (Surgical Oncology) [name], [name], MD as Consulting Physician (Cardiology) [name], MD (Family Medicine) [name], MD as Consulting Physician (General Surgery) [name], MD as Consulting Physician (Neurology) [name], [name], FNP as WOC Nurse (Nurse Practitioner) [name], [name], FNP as WOC Nurse (Nurse Practitioner)  Inpatient Treatment Team: Treatment Team:  [name], MD Patsy Lenis, MD [name], MD Ccs, Md, MD Sebastian Boyer, RN Aurelia Powell LABOR, NT [name], PT Noella Hands, RN Apickup-Ot, A, OT Crutchfield, [name], RN   Problem List:   Principal Problem:   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[facility]) Active Problems:   Hyperlipidemia   Hypertension   Overweight (BMI 25.0-29.9)   Type 2 diabetes mellitus with chronic kidney disease, with long-term current use of insulin  (HCC)   Atrial flutter (HCC)   CKD (chronic kidney disease) stage 4, GFR 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/min (HCC)   IDA (iron  deficiency anemia)   History of prostate cancer   Hx of right BKA (HCC)   HFrEF (heart failure with reduced ejection fraction) (HCC)   Depression   GERD (gastroesophageal reflux disease)   History of CVA (cerebrovascular accident)   01/17/2024  POST-OPERATIVE DIAGNOSIS:  PROXIMAL SIGMOID COLON CANCER   PROCEDURE:   -ROBOTIC SIGMOID COLECTOMY WITH ANASTOMOSIS -INTRAOPERATIVE ASSESSMENT OF TISSUE VASCULAR PERFUSION USING ICG  (indocyanine green ) IMMUNOFLUORESCENCE -TRANSVERSUS ABDOMINIS PLANE (TAP) BLOCK - BILATERAL -FLEXIBLE SIGMOIDOSCOPY   SURGEON:  [name], MD  OR FINDINGS:   Patient had proximal sigmoid colon cancer flopped up into the left upper quadrant.  No obvious metastatic disease on visceral parietal peritoneum or liver.   It is a 31mm EEA anastomosis ( mid descending colon connected to proximal rectum.)  It rests 14 cm from the anal verge by flexible sigmoidoscopy  Assessment [facility] Stay = 6 days) 6 Days Post-Op    Stable.  Awaiting discharge plan  Plan:  ERAS protocol  Follow-up on pathology notes pT3 pN0 adenocarcinoma.  49 lymph nodes sampled negative.  Stage II disease.  Update and told the patient bedside.  I think surgery is all he needs.  Given his advanced age and numerous health issues, would not be a great candidate for post adjuvant chemotherapy or treatment anyway.  I do think it is wise for him to follow-up at the cancer center for survival pathway a colonoscopy in a year.  Hopefully he will be able to be compliant with this.   Tolerating diet.  Bowel regimen with polycarbophil and nighttime Imodium  to help thicken and slow down stools.  He can have extra loperamide  as needed.  Poorly controlled diabetes with significant hyperglycemia.  Better controlled on Lantus .  Defer to medicine but looks like current regimen working okay  Hemoglobin stable for 48 hours =  okay to resume Eliquis  anticoagulation.  Hemoglobin stable so far.  -monitor electrolytes & replace as needed  Keep K>4, Mg>2, Phos>3  HTN control.  Blood pressure soft.  Wonder if he should hold his amlodipine .  Defer to medicine.  -VTE prophylaxis- SCDs.  Anticoagulation prophyllaxis SQ as appropriate  -mobilize as tolerated to help recovery.  He has a BKA but claims that he ambulates well with his prosthesis.  I wrote for therapies to help evaluate him given his numerous hospitalizations and somewhat  deconditioned state.  He has told people he can get his prosthesis on by himself and then tells me he can.  Enlist therapies in moderate/high risk patients as appropriate  I updated the patient's status to the patient  Recommendations were made.  Questions were answered.  He expressed understanding & appreciation.  -Disposition: Patient family amenable to skilled nursing facility this week.  Awaiting placement.  He had been at [facility] and seem to like that.  Hopefully discharge later today/this week.  We will see.  Disposition:  The patient is from: Home Anticipate discharge to:  Skilled Nursing Facility (SNF) Anticipated Date of Discharge is:  September 16,2025   Barriers to discharge:  Pending Clinical improvement (more likely than not), Therapy assessment & Recommendations pending, Testing result pending, and Consultant clearance & sign off    Patient currently is NOT MEDICALLY STABLE for discharge from the hospital from a surgery standpoint.      I reviewed nursing notes, hospitalist notes, last 24 h vitals and pain scores, last 48 h intake and output, last 24 h labs and trends, and last 24 h imaging results.  I have reviewed this patient's available data, including medical history, events of note, test results, etc as part of my evaluation.   A significant portion of that time was spent in counseling. Care during the described time interval was provided by me.  This care required moderate level of medical decision making.  01/23/2024    Subjective: (Chief complaint)  Eating well.  Denies pain.  Open to considering [facility] for skilled nursing facility  Objective:  Vital signs:  Vitals:   01/22/24 1336 01/22/24 2136 01/23/24 0648 01/23/24 1328  BP: 130/76 138/86 138/67 100/67  Pulse: 64 64 73 72  Resp: 16 18 18 18   Temp: (!) 97.3 F (36.3 C) 97.8 F (36.6 C) 98.4 F (36.9 C) 98 F (36.7 C)  TempSrc: Oral Oral Oral   SpO2: 100% 100% 99% 100%  Weight:       Height:        Last BM Date : 01/22/24  Intake/Output   Yesterday:  09/15 0701 - 09/16 0700 In: 1640 [P.O.:1640] Out: 1000 [Urine:1000] This shift:  Total I/O In: 600 [P.O.:600] Out: 500 [Urine:500]  Bowel function:  Flatus: YES  BM:  YES  Drain: (No drain)   Physical Exam:  General: Pt resting to awaken awake/alert in no acute distress.   Eyes: PERRL, normal EOM.  Sclera clear.  No icterus Neuro: CN II-XII intact w/o focal sensory/motor deficits. Lymph: No head/neck/groin lymphadenopathy Psych:  No delerium/psychosis/paranoia.  Occasionally mildly confused but redirectable and at best oriented x 4 -this is his baseline HENT: Normocephalic, Mucus membranes moist.  No thrush.  Mumbling which is his baseline. Neck: Supple, No tracheal deviation.  No obvious thyromegaly Chest: No pain to chest wall compression.  Good respiratory excursion.  No audible wheezing CV:  Pulses intact.  Regular rhythm.  No major extremity edema MS: Normal AROM mjr joints.  No obvious deformity  Abdomen: Soft.  Nondistended.  Nontender.  No evidence of peritonitis.  No incarcerated hernias.  Ext:  No deformity.  No mjr edema.  No cyanosis Skin: No petechiae / purpurea.  No major sores.  Warm  and dry    Results:   Cultures: No results found for this or any previous visit (from the past 720 hours).  Labs: Results for orders placed or performed during the hospital encounter of 01/17/24 (from the past 48 hours)  Glucose, capillary     Status: Abnormal   Collection Time: 01/21/24  9:15 PM  Result Value Ref Range   Glucose-Capillary 172 (H) 70 - 99 mg/dL    Comment: Glucose reference range applies only to samples taken after fasting for at least 8 hours.  Potassium     Status: None   Collection Time: 01/22/24  4:13 AM  Result Value Ref Range   Potassium 3.8 3.5 - 5.1 mmol/L    Comment: Performed at [facility], [address] [place], [place] [address]  Creatinine,  serum     Status: Abnormal   Collection Time: 01/22/24  4:13 AM  Result Value Ref Range   Creatinine, Ser 2.39 (H) 0.61 - 1.24 mg/dL   GFR, Estimated 27 (L) >60 mL/min    Comment: (NOTE) Calculated using the CKD-EPI Creatinine Equation (2021) Performed at [facility], [address], [place], [place] [address]   Glucose, capillary     Status: Abnormal   Collection Time: 01/22/24  7:34 AM  Result Value Ref Range   Glucose-Capillary 114 (H) 70 - 99 mg/dL    Comment: Glucose reference range applies only to samples taken after fasting for at least 8 hours.  Glucose, capillary     Status: Abnormal   Collection Time: 01/22/24 11:15 AM  Result Value Ref Range   Glucose-Capillary 431 (H) 70 - 99 mg/dL    Comment: Glucose reference range applies only to samples taken after fasting for at least 8 hours.  Glucose, random     Status: Abnormal   Collection Time: 01/22/24 12:20 PM  Result Value Ref Range   Glucose, Bld 304 (H) 70 - 99 mg/dL    Comment: Glucose reference range applies only to samples taken after fasting for at least 8 hours. Performed at [facility], [address], [place], [place] [address]   Glucose, capillary     Status: Abnormal   Collection Time: 01/22/24  1:31 PM  Result Value Ref Range   Glucose-Capillary 302 (H) 70 - 99 mg/dL    Comment: Glucose reference range applies only to samples taken after fasting for at least 8 hours.  Glucose, capillary     Status: Abnormal   Collection Time: 01/22/24  4:12 PM  Result Value Ref Range   Glucose-Capillary 40 (LL) 70 - 99 mg/dL    Comment: Glucose reference range applies only to samples taken after fasting for at least 8 hours.   Comment 1 Notify RN   Glucose, capillary     Status: Abnormal   Collection Time: 01/22/24  4:13 PM  Result Value Ref Range   Glucose-Capillary 47 (L) 70 - 99 mg/dL    Comment: Glucose reference range applies only to samples taken after fasting for at least 8  hours.  Glucose, capillary     Status: Abnormal   Collection Time: 01/22/24  4:54 PM  Result Value Ref Range   Glucose-Capillary 47 (L) 70 - 99 mg/dL    Comment: Glucose reference range applies only to samples taken after fasting for at least 8 hours.  Glucose, capillary     Status: Abnormal   Collection Time: 01/22/24  5:23 PM  Result Value Ref Range  Glucose-Capillary 69 (L) 70 - 99 mg/dL    Comment: Glucose reference range applies only to samples taken after fasting for at least 8 hours.  Glucose, capillary     Status: None   Collection Time: 01/22/24  6:13 PM  Result Value Ref Range   Glucose-Capillary 88 70 - 99 mg/dL    Comment: Glucose reference range applies only to samples taken after fasting for at least 8 hours.  Glucose, capillary     Status: Abnormal   Collection Time: 01/22/24  9:18 PM  Result Value Ref Range   Glucose-Capillary 131 (H) 70 - 99 mg/dL    Comment: Glucose reference range applies only to samples taken after fasting for at least 8 hours.  Potassium     Status: None   Collection Time: 01/23/24  4:27 AM  Result Value Ref Range   Potassium 4.0 3.5 - 5.1 mmol/L    Comment: Performed at [facility], [address], [place], [place] [address]  Creatinine, serum     Status: Abnormal   Collection Time: 01/23/24  4:27 AM  Result Value Ref Range   Creatinine, Ser 2.75 (H) 0.61 - 1.24 mg/dL   GFR, Estimated 23 (L) >60 mL/min    Comment: (NOTE) Calculated using the CKD-EPI Creatinine Equation (2021) Performed at [facility], [address], [place], [place] [address]   Glucose, capillary     Status: Abnormal   Collection Time: 01/23/24  8:17 AM  Result Value Ref Range   Glucose-Capillary 133 (H) 70 - 99 mg/dL    Comment: Glucose reference range applies only to samples taken after fasting for at least 8 hours.  Glucose, capillary     Status: Abnormal   Collection Time: 01/23/24 11:38 AM  Result Value Ref Range    Glucose-Capillary 243 (H) 70 - 99 mg/dL    Comment: Glucose reference range applies only to samples taken after fasting for at least 8 hours.  Glucose, capillary     Status: Abnormal   Collection Time: 01/23/24  4:28 PM  Result Value Ref Range   Glucose-Capillary 283 (H) 70 - 99 mg/dL    Comment: Glucose reference range applies only to samples taken after fasting for at least 8 hours.    Imaging / Studies: No results found.  Medications / Allergies: per chart  Antibiotics: Anti-infectives (From admission, onward)    Start     Dose/Rate Route Frequency Ordered Stop   01/17/24 2200  cefoTEtan  (CEFOTAN ) 2 g in sodium chloride  0.9 % 100 mL IVPB        2 g 200 mL/hr over 30 Minutes Intravenous Every 12 hours 01/17/24 1725 01/18/24 0832   01/17/24 1400  neomycin  (MYCIFRADIN ) tablet 1,000 mg  Status:  Discontinued       Placed in And Linked Group   1,000 mg Oral 3 times per day 01/17/24 1005 01/17/24 1013   01/17/24 1400  metroNIDAZOLE  (FLAGYL ) tablet 1,000 mg  Status:  Discontinued       Placed in And Linked Group   1,000 mg Oral 3 times per day 01/17/24 1005 01/17/24 1013   01/17/24 1015  cefoTEtan  (CEFOTAN ) 2 g in sodium chloride  0.9 % 100 mL IVPB        2 g 200 mL/hr over 30 Minutes Intravenous On call to O.R. 01/17/24 1005 01/17/24 1730         Note: Portions of this report may have been transcribed using voice recognition software. Every effort was  made to ensure accuracy; however, inadvertent computerized transcription errors may be present.   Any transcriptional errors that result from this process are unintentional.    Elspeth KYM Schultze, MD, FACS, MASCRS Esophageal, Gastrointestinal & Colorectal Surgery Robotic and Minimally Invasive Surgery  [facility] [address] [place], [place] [address] [phone] Fax [phone] Main  CONTACT INFORMATION: Weekday (9AM-5PM): Call [facility] main office at  [phone] Weeknight (5PM-9AM) or Weekend/Holiday: Check EPIC Web Links tab & use AMION (password  [email or web address]) for General Surgery [facility] coverage  Please, DO NOT use SecureChat  (it is not reliable communication to reach operating surgeons & will lead to a delay in care).   Epic staff messaging available for outptient concerns needing 1-2 business day response.      01/23/2024  4:59 PM

## 2024-01-24 LAB — GLUCOSE, CAPILLARY
Glucose-Capillary: 77 mg/dL (ref 70–99)
Glucose-Capillary: 99 mg/dL (ref 70–99)
Glucose-Capillary: 235 mg/dL — ABNORMAL HIGH (ref 70–99)
Glucose-Capillary: 304 mg/dL — ABNORMAL HIGH (ref 70–99)

## 2024-01-24 LAB — BASIC METABOLIC PANEL WITH GFR
Anion gap: 13 (ref 5–15)
BUN: 40 mg/dL — ABNORMAL HIGH (ref 8–23)
CO2: 24 mmol/L (ref 22–32)
Calcium: 9.4 mg/dL (ref 8.9–10.3)
Chloride: 99 mmol/L (ref 98–111)
Creatinine, Ser: 2.6 mg/dL — ABNORMAL HIGH (ref 0.61–1.24)
GFR, Estimated: 25 mL/min — ABNORMAL LOW (ref >60)
Glucose, Bld: 78 mg/dL (ref 70–99)
Potassium: 3.8 mmol/L (ref 3.5–5.1)
Sodium: 136 mmol/L (ref 135–145)

## 2024-01-24 LAB — CREATININE, SERUM
Creatinine, Ser: 2.57 mg/dL — ABNORMAL HIGH (ref 0.61–1.24)
GFR, Estimated: 25 mL/min — ABNORMAL LOW (ref >60)

## 2024-01-24 LAB — POTASSIUM: Potassium: 3.7 mmol/L (ref 3.5–5.1)

## 2024-01-24 MED ORDER — PANTOPRAZOLE SODIUM 40 MG PO TBEC: 40.0000 mg | DELAYED_RELEASE_TABLET | Freq: Every day | ORAL | 0 refills | Status: DC

## 2024-01-24 MED ORDER — INSULIN GLARGINE 100 UNIT/ML ~~LOC~~ SOLN
16.0000 [IU] | Freq: Every day | SUBCUTANEOUS | Status: DC
Start: 2024-01-24 — End: 2024-01-24
  Administered 2024-01-24: 16 [IU] via SUBCUTANEOUS
  Filled 2024-01-24: qty 0.16

## 2024-01-24 NOTE — Progress Notes (Signed)
 PROGRESS NOTE    Cory [name]  FMW:[id number] DOB: 1945-10-27 DOA: 01/17/2024 PCP: [name], MD   Brief Narrative: 78 year old with past medical history significant for a flutter, diabetes type 2, recent incidental CVA June 2025, CKD IV, hypertension, hyperlipidemia, IDA diagnosed with colon cancer December 2024, before able to schedule surgery he has had recurrent illnesses, hospitalization and recent stroke in June.  He has been able to reschedule surgery recently underwent clearance by neurology prior to surgery.  He underwent a robotic assisted sigmoid colectomy with anastomosis on 01/17/2024.  We are asked to assist with help managing chronic medical problems.     Assessment & Plan:   Principal Problem:   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(HCC) Active Problems:   Atrial flutter (HCC)   Type 2 diabetes mellitus with chronic kidney disease, with long-term current use of insulin  (HCC)   HFrEF (heart failure with reduced ejection fraction) (HCC)   CKD (chronic kidney disease) stage 4, GFR 15-29 ml/min (HCC)   Hyperlipidemia   Hypertension   Overweight (BMI 25.0-29.9)   IDA (iron  deficiency anemia)   History of prostate cancer   Hx of right BKA (HCC)   Depression   GERD (gastroesophageal reflux disease)   History of CVA (cerebrovascular accident)   1-Colon adenocarcinoma: Status post robotic sigmoid colectomy with anastomosis  by Dr. Sheldon on 9/10 Doing well postop.  Plan to discharge to rehab, but family then decided to go home  A-flutter: Continue Eliquis  and Coreg   Heart failure reduced ejection fraction: Holding spironolactone  for now due to mildly worsening renal function. Continue torsemide .  Hold telmisartan  at discharge as well due to increased creatinine.  Close follow-up with PCP to resume this medication  Diabetes type 2 with chronic kidney disease long-term use of insulin : Resume home dose of insulin  15 units.  Sliding scale  CKD stage IV: Fattening baseline  2.5 Increased to 2.6.  Continue torsemide .  Continue to hold spironolactone  and ARB at discharge.  Close follow-up for labs.  History of CVA: Continue Eliquis   GERD Continue Protonix   Depression: Continue Zoloft   History of right BKA  IDA: Monitor hemoglobin mild downtrend postop  Overweight Lifestyle modification  Hypertension Continue Coreg , amlodipine  and torsemide .  Hold spironolactone  and telmisartan  to avoid worsening renal function in the setting of recent surgery  Estimated body mass index is 25.37 kg/m as calculated from the following:   Height as of this encounter: 5' 11 (1.803 m).   Weight as of this encounter: 82.5 kg.   Subjective: He is alert and conversant, denies dyspnea or chest pain.  Denies any worsening abdominal pain.  Tolerating diet.  Objective: Vitals:   01/23/24 0648 01/23/24 1328 01/23/24 2139 01/24/24 0529  BP: 138/67 100/67 129/77 133/75  Pulse: 73 72 65 65  Resp: 18 18 18 18   Temp: 98.4 F (36.9 C) 98 F (36.7 C) (!) 97.5 F (36.4 C) 98.1 F (36.7 C)  TempSrc: Oral  Oral Oral  SpO2: 99% 100% 100% 99%  Weight:      Height:        Intake/Output Summary (Last 24 hours) at 01/24/2024 0738 Last data filed at 01/24/2024 0600 Gross per 24 hour  Intake 1420 ml  Output 1600 ml  Net -180 ml   Filed Weights   01/19/24 0500 01/21/24 0500 01/22/24 0500  Weight: 84.2 kg 82.7 kg 82.5 kg    Examination:  General exam: Appears calm and comfortable  Respiratory system: Clear to auscultation. Respiratory effort normal. Cardiovascular system: S1 &  S2 heard, RRR. No JVD, murmurs, rubs, gallops or clicks. No pedal edema. Gastrointestinal system: Abdomen is nondistended, soft and nontender. No organomegaly or masses felt. Normal bowel sounds heard. Central nervous system: Alert and oriented. No focal neurological deficits. Extremities: Symmetric 5 x 5 power.   Data Reviewed: I have personally reviewed following labs and imaging  studies  CBC: Recent Labs  Lab 01/18/24 0332 01/19/24 0431 01/20/24 0542 01/21/24 0437  WBC 7.3  --   --   --   HGB 10.7* 10.2* 9.0* 9.2*  HCT 33.8*  --   --   --   MCV 83.9  --   --   --   PLT 192  --   --   --    Basic Metabolic Panel: Recent Labs  Lab 01/18/24 0332 01/19/24 0431 01/20/24 0542 01/22/24 0413 01/22/24 1220 01/23/24 0427 01/24/24 0452  NA 131* 133* 133*  --   --   --   --   K 4.6 4.3 4.0 3.8  --  4.0 3.7  CL 97* 99 98  --   --   --   --   CO2 20* 21* 23  --   --   --   --   GLUCOSE 256* 103* 138*  --  304*  --   --   BUN 24* 28* 24*  --   --   --   --   CREATININE 1.84* 2.33* 2.14* 2.39*  --  2.75* 2.57*  CALCIUM  9.1 9.1 8.9  --   --   --   --   MG 2.1  --   --   --   --   --   --   PHOS 3.8  --   --   --   --   --   --    GFR: Estimated Creatinine Clearance: 26 mL/min (A) (by C-G formula based on SCr of 2.57 mg/dL (H)). Liver Function Tests: No results for input(s): AST, ALT, ALKPHOS, BILITOT, PROT, ALBUMIN  in the last 168 hours. No results for input(s): LIPASE, AMYLASE in the last 168 hours. No results for input(s): AMMONIA in the last 168 hours. Coagulation Profile: No results for input(s): INR, PROTIME in the last 168 hours. Cardiac Enzymes: No results for input(s): CKTOTAL, CKMB, CKMBINDEX, TROPONINI in the last 168 hours. BNP (last 3 results) No results for input(s): PROBNP in the last 8760 hours. HbA1C: No results for input(s): HGBA1C in the last 72 hours. CBG: Recent Labs  Lab 01/22/24 2118 01/23/24 0817 01/23/24 1138 01/23/24 1628 01/23/24 2134  GLUCAP 131* 133* 243* 283* 236*   Lipid Profile: No results for input(s): CHOL, HDL, LDLCALC, TRIG, CHOLHDL, LDLDIRECT in the last 72 hours. Thyroid Function Tests: No results for input(s): TSH, T4TOTAL, FREET4, T3FREE, THYROIDAB in the last 72 hours. Anemia Panel: No results for input(s): VITAMINB12, FOLATE, FERRITIN,  TIBC, IRON , RETICCTPCT in the last 72 hours. Sepsis Labs: No results for input(s): PROCALCITON, LATICACIDVEN in the last 168 hours.  No results found for this or any previous visit (from the past 240 hours).       Radiology Studies: No results found.      Scheduled Meds:  acetaminophen   1,000 mg Oral Q6H   apixaban   5 mg Oral BID   atorvastatin   20 mg Oral QHS   carvedilol   25 mg Oral BID WC   feeding supplement (GLUCERNA SHAKE)  237 mL Oral BID BM   gabapentin   200 mg Oral QHS  insulin  aspart  0-9 Units Subcutaneous TID PC & HS   insulin  aspart  4 Units Subcutaneous TID PC   insulin  glargine  20 Units Subcutaneous Daily   loperamide   2 mg Oral QHS   pantoprazole   40 mg Oral Daily   polycarbophil  625 mg Oral BID   sertraline   50 mg Oral Daily   sodium chloride  flush  3 mL Intravenous Q12H   spironolactone   100 mg Oral Daily   tamsulosin   0.4 mg Oral Daily   torsemide   20 mg Oral Daily   Vitamin D  (Ergocalciferol )  50,000 Units Oral Weekly   Continuous Infusions:  sodium chloride        LOS: 7 days    Time spent: 35 minutes    Hamlin Devine A Mycheal Veldhuizen, MD [facility]   If 7PM-7AM, please contact night-coverage [email or web address]  01/24/2024, 7:38 AM

## 2024-01-24 NOTE — TOC Transition Note (Signed)
 Transition of Care [facility]) - Discharge Note   Patient Details  Name: Cory [name] MRN: [id number] Date of Birth: 1945-07-04  Transition of [facility]) CM/SW Contact:  Alfonse JONELLE Rex, RN Phone Number: 01/24/2024, 12:49 PM   Clinical Narrative:  Family decided to dc home instead of SNF.  DC to home w/HH PT/OT/HHA w/[facility], added to AVS. [name], niece, to provide transport home. No further TOC needs identified at this time.     Final next level of care: Home w Home Health Services Barriers to Discharge: Barriers Resolved   Patient Goals and CMS Choice Patient states their goals for this hospitalization and ongoing recovery are:: return home CMS Medicare.gov Compare Post Acute Care list provided to:: Patient Represenative (must comment) MARVEEN [name] (Niece)  [phone] (Mobile)) Choice offered to / list presented to : Adult Children [place] ownership interest in [facility].provided to:: Adult Children    Discharge Placement                       Discharge Plan and Services Additional resources added to the After Visit Summary for                            [facility] Arranged: PT, OT (HHA) HH Agency: [facility] Date [facility] Agency Contacted: 01/24/24 Time HH Agency Contacted: 1249 Representative spoke with at [facility] Agency: [name]  Social Drivers of Health (SDOH) Interventions SDOH Screenings   Food Insecurity: Food Insecurity Present (01/17/2024)  Housing: High Risk (01/17/2024)  Transportation Needs: No Transportation Needs (01/17/2024)  Utilities: At Risk (01/17/2024)  Depression (PHQ2-9): Low Risk  (10/09/2023)  Social Connections: Moderately Integrated (01/17/2024)  Recent Concern: Social Connections - Moderately Isolated (10/19/2023)  Tobacco Use: Medium Risk (01/17/2024)     Readmission Risk Interventions    01/24/2024   12:46 PM 01/18/2024   12:27 PM  Readmission Risk Prevention Plan  Transportation Screening  Complete  Medication Review (RN Care  Manager) Complete Complete  PCP or Specialist appointment within 3-5 days of discharge Complete Complete  HRI or Home Care Consult Complete Complete  SW Recovery Care/Counseling Consult  Complete  Palliative Care Screening Complete Not Applicable  Skilled Nursing Facility Not Applicable Complete

## 2024-01-24 NOTE — Plan of Care (Signed)
  Problem: Clinical Measurements: Goal: Postoperative complications will be avoided or minimized Outcome: Progressing   Problem: Skin Integrity: Goal: Will show signs of wound healing Outcome: Progressing   Problem: Clinical Measurements: Goal: Ability to maintain clinical measurements within normal limits will improve Outcome: Progressing Goal: Diagnostic test results will improve Outcome: Progressing Goal: Respiratory complications will improve Outcome: Progressing

## 2024-01-24 NOTE — TOC Transition Note (Addendum)
 Transition of Care [facility]) - Discharge Note   Patient Details  Name: Cory [name] MRN: [id number] Date of Birth: 11-01-1945  Transition [facility]) CM/SW Contact:  Alfonse JONELLE Rex, RN Phone Number: 01/24/2024, 12:04 PM   Clinical Narrative:  Call to patient's niece, Cory [name], updated that [name] declined request for 5 day cap admit. Reviewed current bed offers Cory [name], [name]). [name] to discuss with patient and update NCM.     -12:35pm Call received from [facility], confirmed family has decided to take patient home. Reviewed patient already arranged for [name] PT/OT w/[facility], will request attending to add HHA per family request. [name] w/[facility] notified, added to AVS.           Patient Goals and CMS Choice            Discharge Placement                       Discharge Plan and Services Additional resources added to the After Visit Summary for                                       Social Drivers of Health (SDOH) Interventions SDOH Screenings   Food Insecurity: Food Insecurity Present (01/17/2024)  Housing: High Risk (01/17/2024)  Transportation Needs: No Transportation Needs (01/17/2024)  Utilities: At Risk (01/17/2024)  Depression (PHQ2-9): Low Risk  (10/09/2023)  Social Connections: Moderately Integrated (01/17/2024)  Recent Concern: Social Connections - Moderately Isolated (10/19/2023)  Tobacco Use: Medium Risk (01/17/2024)     Readmission Risk Interventions    01/18/2024   12:27 PM  Readmission Risk Prevention Plan  Transportation Screening Complete  Medication Review (RN Care Manager) Complete  PCP or Specialist appointment within 3-5 days of discharge Complete  HRI or Home Care Consult Complete  SW Recovery Care/Counseling Consult Complete  Palliative Care Screening Not Applicable  Skilled Nursing Facility Complete

## 2024-01-24 NOTE — Progress Notes (Signed)
 Physical Therapy Treatment Patient Details Name: Cory [name] MRN: [id number] DOB: [birth year] Today's Date: 01/24/2024   History of Present Illness Cory [name] is a 78 year old male with robotic sigmoid colectomy with anastomosis 2* colon CA PMH: R BKA,  A-flutter, DM II, recent incidental CVA June 2025, CKD 3B, HTN, HLD, IDA.    PT Comments  Pt continues to make steady progress. Amb ~ 600' with RW, short distance with cane; supervision overall, nearing mod I level. Pt may benefit from HHPT  at d/c. Continue PT POC    If plan is discharge home, recommend the following: Assist for transportation;Help with stairs or ramp for entrance   Can travel by private vehicle        Equipment Recommendations  None recommended by PT    Recommendations for Other Services       Precautions / Restrictions Precautions Precautions: Fall Recall of Precautions/Restrictions: Intact Restrictions Weight Bearing Restrictions Per Provider Order: No     Mobility  Bed Mobility               General bed mobility comments: in recliner    Transfers Overall transfer level: Needs assistance Equipment used: Rolling walker (2 wheels) Transfers: Sit to/from Stand Sit to Stand: Supervision, Modified independent (Device/Increase time)           General transfer comment: STS x2. demonstrates proper hand placement and safety awareness. descent is controlled    Ambulation/Gait Ambulation/Gait assistance: Supervision, Contact guard assist Gait Distance (Feet): 600 Feet (15) Assistive device: Rolling walker (2 wheels), Straight cane Gait Pattern/deviations: Step-through pattern, Decreased dorsiflexion - left, Steppage, Trunk flexed       General Gait Details: CGA with fade to supervision for safety, initial cues for posture, proper distance from RW. no LOB. able to amb short distance in room with cane and CGA   Stairs             Wheelchair Mobility     Tilt Bed    Modified  Rankin (Stroke Patients Only)       Balance     Sitting balance-Leahy Scale: Normal     Standing balance support: During functional activity, Reliant on assistive device for balance, Single extremity supported, No upper extremity supported Standing balance-Leahy Scale: Fair Standing balance comment: with R prosthetic in place                            Communication Communication Communication: No apparent difficulties  Cognition Arousal: Alert Behavior During Therapy: WFL for tasks assessed/performed   PT - Cognitive impairments: No apparent impairments                       PT - Cognition Comments: pt is A&O x3 Following commands: Intact      Cueing Cueing Techniques: Verbal cues  Exercises      General Comments        Pertinent Vitals/Pain Pain Assessment Pain Assessment: No/denies pain    Home Living                          Prior Function            PT Goals (current goals can now be found in the care plan section) Acute Rehab PT Goals PT Goal Formulation: With patient Time For Goal Achievement: 02/01/24 Potential to Achieve Goals: Good Progress towards PT goals: Progressing toward goals  Frequency    Min 2X/week      PT Plan      Co-evaluation              AM-PAC PT 6 Clicks Mobility   Outcome Measure  Help needed turning from your back to your side while in a flat bed without using bedrails?: None Help needed moving from lying on your back to sitting on the side of a flat bed without using bedrails?: None Help needed moving to and from a bed to a chair (including a wheelchair)?: A Little Help needed standing up from a chair using your arms (e.g., wheelchair or bedside chair)?: None Help needed to walk in hospital room?: A Little Help needed climbing 3-5 steps with a railing? : A Little 6 Click Score: 21    End of Session Equipment Utilized During Treatment: Gait belt Activity Tolerance:  Patient tolerated treatment well Patient left: in chair;with call bell/phone within reach;with chair alarm set Nurse Communication: Mobility status PT Visit Diagnosis: Other abnormalities of gait and mobility (R26.89)     Time: 8857-8791 PT Time Calculation (min) (ACUTE ONLY): 26 min  Charges:    $Gait Training: 23-37 mins PT General Charges $$ ACUTE PT VISIT: 1 Visit                     Cory [name], PT  Acute Rehab Dept [facility]) [phone]  01/24/2024    Operating Room Services 01/24/2024, 12:40 PM

## 2024-02-01 ENCOUNTER — Encounter (HOSPITAL_COMMUNITY): Payer: Self-pay

## 2024-02-14 ENCOUNTER — Telehealth: Payer: Self-pay | Admitting: Internal Medicine

## 2024-02-14 NOTE — Telephone Encounter (Signed)
 Spoke with Cory [name] and advised to continue current medications and have BMP in 1 week.  Arnold repeated ordered, stated understanding and expressed appreciation for the call back and information.

## 2024-02-14 NOTE — Telephone Encounter (Signed)
 Pt c/o swelling/edema: STAT if pt has developed SOB within 24 hours  If swelling, where is the swelling located? Left leg/foot  How much weight have you gained and in what time span? 9.1 in 8 days  Have you gained 2 pounds in a day or 5 pounds in a week? yes  Do you have a log of your daily weights (if so, list)? 10/1 179, 10/6 187.7, 10/8 188.1  Are you currently taking a fluid pill? NP pt was taking off these medication when he was in the hospital in Sept (Discharged 9/17) Spironolactone , Potassium Chloride , telmisarten  Are you currently SOB? No   Have you traveled recently in a car or plane for an extended period of time?

## 2024-02-14 NOTE — Telephone Encounter (Signed)
 Spoke with Eveline from [facility] who is reporting pt's wt is up almost 10 lbs in 1 week.last Monday wt was 179 and today 188 lbs.  Pt reports he feels fine, denies any shortness of breath.  Perhaps some slight swelling in left leg/foot (has right AKA).  Eveline reports pt's incision is healing well and pt is having good bowel movements per his report.  Pt's spironolactone , potassium and telmisartan  have been held since his recent surgery.  Most recent BUN 40, Crea 2.6 on 01/24/24. BPs have been 112/76 to 140/78, HR 68 to 80 bpm.  Advised to continue to monitor.  I will forward this to Dr Wendel for his review and any new orders for treatment.   Eveline appreciative the assistance.  He does ask to be contacted with any changes she that he can verify the patient is following through correctly.

## 2024-03-04 ENCOUNTER — Other Ambulatory Visit: Payer: Self-pay | Admitting: Nurse Practitioner

## 2024-03-04 ENCOUNTER — Other Ambulatory Visit: Payer: Self-pay

## 2024-03-04 DIAGNOSIS — C186 Malignant neoplasm of descending colon: Secondary | ICD-10-CM

## 2024-03-04 NOTE — Progress Notes (Unsigned)
 Patient Care Team: Cory Lamar [name] [name], MD as PCP - General (Family Medicine) Cory Lurena POUR, MD as PCP - Cardiology (Cardiology) Cory Norleen [name], MD as Referring Physician (Cardiology) Cory Grayson, MD as Referring Physician (Geriatric Medicine) Cory [name], [name], NP as Nurse Practitioner (Nurse Practitioner) Cory Callander, MD as Consulting Physician (Hematology and Oncology) Cory Anes, MD as Referring Physician (Surgical Oncology) [name], Cory SAUNDERS, MD as Consulting Physician (Cardiology) Cory Lamar [name] [name], MD (Family Medicine) Cory Standing, MD as Consulting Physician (General Surgery) Cory Isles, MD as Consulting Physician (Neurology) [name], [name], FNP as WOC Nurse (Nurse Practitioner) [name], [name], FNP as WOC Nurse (Nurse Practitioner)  Clinic Day:  03/05/2024  Referring physician: [name], MD  ASSESSMENT & PLAN:   Assessment & Plan: Cancer of left colon [facility])  left-sided colon cancer with no metastasis on PET scan in 04/2023. Previously ineligible for surgery due to severe illness in December, but now a candidate. Concerns about diabetes and kidney function affecting surgical complications. Anticipated laparoscopic surgery without colostomy. Potential need for chemotherapy post-surgery if lymph nodes are involved. Importance of optimizing diabetes management to reduce surgical risks emphasized. -Due to his multiple serious medical comorbidities, his surgeon Dr. Joetta is very concerned about postop complications, and recommended him to have surgery in [facility] system. - Patient underwent sigmoid colectomy on 01/17/2024 per Dr. Sheldon, [facility] surgery.  He had 49 lymph nodes removed, all negative for cancer. Pathology showed pT3 pN0 stage II cancer.  Cancer surveillance over next 5 years has been recommended.  He should have a follow-up colonoscopy in the next 1-3 years to evaluate for recurrence and/or new cancer. - Plan for labs and follow-up in 3 to 4  months. - Repeat CT CAP in September 2026 with labs and follow-up 1 to 2 weeks after.   Iron  deficiency anemia Mild and improved.  Hgb 11.4 and HCT 34.6.  Ferritin is 76 with normal iron  panel today.  No requirement for blood or iron  infusion.  Will continue to monitor every 3 months and treat as indicated.  Chronic kidney disease Likely multifactorial, stemming from poorly controlled DM2 and AKI sustained in 04/2023 from COVID-19 virus.  He is followed by primary care for this.  Labs are slightly improved with normal BUN of 20 and serum creatinine of 2.12.  His eGFR is 32.  Stage II colon cancer Recent history of sigmoid colectomy on 01/17/2024 per Dr. Sheldon.  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  There were 49 lymph nodes removed, all negative for malignancy.  Patient not considered to be high risk for recurrence though still with approximately 20% chance of recurrence.  Will monitor him closely.  Will repeat labs and follow-up every 3 to 4 months with a repeat CT CAP in 12 months.  Strongly encourage patient to contact cancer center with any recent symptoms such as new abdominal pain, bloating, increased gas, change in bowel habits, or blood in the stool.  Patient also have a repeat colonoscopy 12 months after his surgery (01/2025).  Plan This was a shared visit with Dr. Lanny Reviewed labs.  -Mild and stable anemia. -Normal ferritin and iron  panel.  -mild improvement of chronic kidney disease. Reviewed pathology from sigmoid colectomy done 01/17/2024. -Stage II colon cancer (pT3 pN0) - Close surveillance recommended - Labs and follow-up in 3 months - Repeat CT CAP in September 2026 with labs and follow-up approximately 1 week later to review.   The patient understands the plans discussed today  and is in agreement with them.  He knows to contact our office if he develops concerns prior to his next appointment.   Cory FORBES Lessen, NP  [facility] - A DEPT OF [facility] [address] [place] [address] Dept: [phone] Dept Fax: [phone]   No orders of the defined types were placed in this encounter.     CHIEF COMPLAINT:  CC: Colon cancer  Current Treatment:  Cancer surveillance  INTERVAL HISTORY:  Cory [name] is here today for repeat clinical assessment.  His initial visit with Dr.eng was 07/19/2023.  He underwent sigmoid colectomy on 01/17/2024 per Dr. Sheldon.  Pathology showed pT3 pN0 stage II cancer.  He had 49 lymph nodes removed, all negative for cancer.  It has been recommended that he be followed closely for the next 5 years by cancer center.  Should have a follow-up colonoscopy in the next 1-3 year.  Patient states he has been feeling very well since surgery in September.  Continues to have some swelling in his left lower extremity.  This has been ongoing since initial visit in March 2025.  He did have LE Doppler of the left lower leg at that time.  There was no evidence of blood clot or cystic structure in the lower leg.  He is on Eliquis  for chronic A-fib.  He denies chest pain, chest pressure, or shortness of breath. He denies headaches or visual disturbances. He denies abdominal pain, nausea, vomiting, or changes in bowel or bladder habits.  He denies blood in the stool.  He denies fevers or chills. He denies pain. His appetite is good. His weight has increased 7 pounds over last 2 months.  I have reviewed the past medical history, past surgical history, social history and family history with the patient and they are unchanged from previous note.  ALLERGIES:  is allergic to fish allergy, porcine (pork) protein-containing drug products, shellfish allergy, and pumpkin seed oil.  MEDICATIONS:  Current Outpatient Medications  Medication Sig Dispense Refill   acetaminophen  (TYLENOL ) 325 MG tablet Take 2 tablets (650 mg total) by mouth every 6 (six) hours as needed  for mild pain, headache or fever (or Fever >/= 101). (Patient taking differently: Take 650 mg by mouth daily as needed for mild pain (pain score 1-3), headache, fever or moderate pain (pain score 4-6).)     amLODipine  (NORVASC ) 5 MG tablet Take 1 tablet (5 mg total) by mouth daily.     apixaban  (ELIQUIS ) 5 MG TABS tablet Take 5 mg by mouth 2 (two) times daily.     atorvastatin  (LIPITOR) 20 MG tablet Take 20 mg by mouth at bedtime.     carvedilol  (COREG ) 25 MG tablet Take 25 mg by mouth 2 (two) times daily with a meal.     insulin  lispro (HUMALOG ) 100 UNIT/ML injection Inject 0-12 Units into the skin with breakfast, with lunch, and with evening meal.     LANTUS  SOLOSTAR 100 UNIT/ML Solostar Pen Inject 15 Units into the skin at bedtime.     ondansetron  (ZOFRAN ) 4 MG tablet Take 4 mg by mouth every 8 (eight) hours as needed for nausea or vomiting.     sertraline  (ZOLOFT ) 50 MG tablet Take 50 mg by mouth daily.     tamsulosin  (FLOMAX ) 0.4 MG CAPS capsule Take 0.4 mg by mouth daily.     torsemide  (DEMADEX ) 20 MG tablet Take 20 mg by mouth daily.  Vitamin D , Ergocalciferol , (DRISDOL ) 1.25 MG (50000 UNIT) CAPS capsule Take 50,000 Units by mouth once a week. Sundays     No current facility-administered medications for this visit.    HISTORY OF PRESENT ILLNESS:   Oncology History  Cancer of left colon (HCC)  04/19/2023 Initial Diagnosis   Cancer of left colon (HCC)   04/19/2023 PET scan   IMPRESSION:  1. Hypermetabolic proximal descending colon mass. No evidence of locoregional or distant metastatic disease.  2. Punctate left renal stone.  3. Aortic atherosclerosis (ICD10-I70.0). Left anterior descending coronary artery calcification.    09/18/2023 Imaging   CT CAP with contrast  IMPRESSION: 1. No acute traumatic injury to the chest, abdomen or pelvis. 2. 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 3. Hypoattenuating areas in the left iliacus/iliopsoas muscle, as described above. 4. Multiple other nonacute observations, as described above.   01/17/2024 Surgery   Sigmoid colectomy - per Dr. Sheldon [facility])   01/17/2024 Pathology Results   FINAL MICROSCOPIC DIAGNOSIS:  A. RECTOSIGMOID COLON, MASS, PARTIAL COLECTOMY: Invasive moderately differentiated adenocarcinoma invading through muscularis propria (pT3) Tumor measures 5.2 x 4.1 x 1.6 cm Tumor arises within a tubular adenoma with high-grade dysplasia Negative for angiolymphatic and perineural invasion Margins free of carcinoma and dysplasia Forty-nine pericolic lymph nodes, negative for metastatic carcinoma (0/49, pN0) Atypical lymphoid follicular hyperplasia (see comment) One additional tubular adenoma  B. PROXIMAL MARGIN, EXCISION: Benign colon, negative for carcinoma  ONCOLOGY TABLE:  COLON AND RECTUM, CARCINOMA:  Resection  Procedure: Left partial colectomy Tumor Site: Proximal sigmoid colon Tumor Size: 5.2 x 4.1 x 1.6 cm Macroscopic Tumor Perforation: Not identified Macroscopic Evaluation of Mesorectum (required for rectal cancer): Proximal mesorectum complete Histologic Type: Adenocarcinoma Histologic Grade: Moderately differentiated (low-grade) Multiple Primary Sites: Not applicable Tumor Extension: Tumor invades through muscularis propria into pericolic soft tissue Lymphovascular Invasion: Not identified Perineural Invasion: Not identified Treatment Effect: No known presurgical therapy Margins:      Margin Status for Invasive Carcinoma: All margins negative for invasive carcinoma      Distance from Invasive Carcinoma to Radial (Circumferential) Margin: 8.2 cm      Distance from Invasive Carcinoma to Closest Mucosal Margin: 4 cm, proximal (final proximal margin 5 cm)      Margin Status for Non-Invasive Tumor: All margins negative for dysplasia Regional Lymph  Nodes:      Number of Lymph Nodes with Tumor: 0      Number of Lymph Nodes Examined: 49 Tumor Deposits: Not identified Distant Metastasis: Not applicable Pathologic Stage Classification (pTNM, AJCC 8th Edition): pT3, pN0 Ancillary Studies: MMR and MSI testing has been ordered, and these results will be issued within addendums. Representative Tumor Block: A5, A6 Comments: Many of lymph nodes exhibit a striking follicular hyperplasia with the majority of the follicles exhibiting normal polarity with tingible-body macrophages.  Within several of the lymph nodes, there is progressive transformation of germinal centers and naked germinal centers.  A formal hematopathology consultation has been requested, the results of which will be issued within an addendum to this report. (v4.2.0.1)        REVIEW OF SYSTEMS:   Constitutional: Denies fevers, chills or abnormal weight loss Eyes: Denies blurriness of vision Ears, nose, mouth, throat, and face: Denies mucositis or sore throat Respiratory: Denies cough, dyspnea or wheezes Cardiovascular: Denies palpitation Gastrointestinal:  Denies nausea, heartburn or change in bowel habits Skin: Denies abnormal  skin rashes Lymphatics: Denies new lymphadenopathy or easy bruising Neurological:Denies numbness, tingling or new weaknesses Behavioral/Psych: Mood is stable, no new changes  All other systems were reviewed with the patient and are negative.   VITALS:   Today's Vitals   03/05/24 1031 03/05/24 1037  BP: 136/78   Pulse: 64   Resp: 17   Temp: 98.7 F (37.1 C)   SpO2: 99%   Weight: 192 lb 11.2 oz (87.4 kg)   PainSc:  0-No pain   Body mass index is 26.88 kg/m.   Wt Readings from Last 3 Encounters:  03/05/24 192 lb 11.2 oz (87.4 kg)  01/22/24 181 lb 14.1 oz (82.5 kg)  01/05/24 185 lb (83.9 kg)    Body mass index is 26.88 kg/m.  Performance status (ECOG): 1 - Symptomatic but completely ambulatory  PHYSICAL EXAM:   GENERAL:alert, no  distress and comfortable SKIN: skin color, texture, turgor are normal, no rashes or significant lesions EYES: normal, Conjunctiva are pink and non-injected, sclera clear OROPHARYNX:no exudate, no erythema and lips, buccal mucosa, and tongue normal  NECK: supple, thyroid normal size, non-tender, without nodularity LYMPH:  no palpable lymphadenopathy in the cervical, axillary or inguinal LUNGS: clear to auscultation and percussion with normal breathing effort HEART: regular rate & rhythm and no murmurs. Left lower extremity edema without pain, redness, or warmth.  ABDOMEN:abdomen soft, non-tender and normal bowel sounds. Well-healed surgical scars.  Musculoskeletal:no cyanosis of digits and no clubbing  NEURO: alert & oriented x 3 with fluent speech, no focal motor/sensory deficits  LABORATORY DATA:  I have reviewed the data as listed    Component Value Date/Time   NA 136 03/05/2024 1001   NA 138 08/28/2023 1438   K 4.1 03/05/2024 1001   CL 99 03/05/2024 1001   CO2 31 03/05/2024 1001   GLUCOSE 239 (H) 03/05/2024 1001   BUN 20 03/05/2024 1001   BUN 27 08/28/2023 1438   CREATININE 2.12 (H) 03/05/2024 1001   CALCIUM  8.8 (L) 03/05/2024 1001   PROT 7.2 03/05/2024 1001   PROT 6.3 08/28/2023 1438   ALBUMIN  3.9 03/05/2024 1001   ALBUMIN  3.4 (L) 08/28/2023 1438   AST 17 03/05/2024 1001   ALT 10 03/05/2024 1001   ALKPHOS 71 03/05/2024 1001   BILITOT 0.4 03/05/2024 1001   GFRNONAA 32 (L) 03/05/2024 1001    Lab Results  Component Value Date   WBC 3.5 (L) 03/05/2024   NEUTROABS 1.8 03/05/2024   HGB 11.4 (L) 03/05/2024   HCT 34.6 (L) 03/05/2024   MCV 83.8 03/05/2024   PLT 194 03/05/2024   Addendum I have seen the patient, examined him. I agree with the assessment and and plan and have edited the notes.   I reviewed his surgical pathology, which showed T3 moderately differentiated adenocarcinoma in rectosigmoid colon, all lymph nodes were negative, margins were negative.  No  lymphovascular invasion or perineural invasion.  We discussed his risk of recurrence and surveillance.  Given the lack of high risk in his stage II disease, I do not recommend adjuvant chemotherapy.  We discussed circulating tumor DNA monitoring, and repeating CT scan and colonoscopy in 1 year.  All questions were answered.  Patient agrees with the plan.  Will see him back in 3 months for follow-up.  Onita Mattock MD 03/05/2024

## 2024-03-04 NOTE — Assessment & Plan Note (Addendum)
 left-sided colon cancer with no metastasis on PET scan in 04/2023. Previously ineligible for surgery due to severe illness in December, but now a candidate. Concerns about diabetes and kidney function affecting surgical complications. Anticipated laparoscopic surgery without colostomy. Potential need for chemotherapy post-surgery if lymph nodes are involved. Importance of optimizing diabetes management to reduce surgical risks emphasized. -Due to his multiple serious medical comorbidities, his surgeon Dr. Joetta is very concerned about postop complications, and recommended him to have surgery in [facility] system. - Patient underwent sigmoid colectomy on 01/17/2024 per Dr. Sheldon, [facility] surgery.  He had 49 lymph nodes removed, all negative for cancer. Pathology showed pT3 pN0 stage II cancer.  Cancer surveillance over next 5 years has been recommended.  He should have a follow-up colonoscopy in the next 1-3 years to evaluate for recurrence and/or new cancer. - Plan for labs and follow-up in 3 to 4 months. - Repeat CT CAP in September 2026 with labs and follow-up 1 to 2 weeks after.

## 2024-03-05 ENCOUNTER — Inpatient Hospital Stay: Attending: Nurse Practitioner

## 2024-03-05 ENCOUNTER — Inpatient Hospital Stay: Admitting: Hematology

## 2024-03-05 ENCOUNTER — Inpatient Hospital Stay

## 2024-03-05 ENCOUNTER — Inpatient Hospital Stay: Admitting: Nurse Practitioner

## 2024-03-05 VITALS — BP 136/78 | HR 64 | Temp 98.7°F | Resp 17 | Wt 192.7 lb

## 2024-03-05 DIAGNOSIS — C186 Malignant neoplasm of descending colon: Secondary | ICD-10-CM | POA: Diagnosis not present

## 2024-03-05 DIAGNOSIS — E1122 Type 2 diabetes mellitus with diabetic chronic kidney disease: Secondary | ICD-10-CM | POA: Diagnosis not present

## 2024-03-05 DIAGNOSIS — I482 Chronic atrial fibrillation, unspecified: Secondary | ICD-10-CM | POA: Insufficient documentation

## 2024-03-05 DIAGNOSIS — D509 Iron deficiency anemia, unspecified: Secondary | ICD-10-CM | POA: Insufficient documentation

## 2024-03-05 DIAGNOSIS — C187 Malignant neoplasm of sigmoid colon: Secondary | ICD-10-CM | POA: Insufficient documentation

## 2024-03-05 DIAGNOSIS — Z7901 Long term (current) use of anticoagulants: Secondary | ICD-10-CM | POA: Diagnosis not present

## 2024-03-05 DIAGNOSIS — N189 Chronic kidney disease, unspecified: Secondary | ICD-10-CM | POA: Insufficient documentation

## 2024-03-05 LAB — CMP (CANCER CENTER ONLY)
ALT: 10 U/L (ref 0–44)
AST: 17 U/L (ref 15–41)
Albumin: 3.9 g/dL (ref 3.5–5.0)
Alkaline Phosphatase: 71 U/L (ref 38–126)
Anion gap: 6 (ref 5–15)
BUN: 20 mg/dL (ref 8–23)
CO2: 31 mmol/L (ref 22–32)
Calcium: 8.8 mg/dL — ABNORMAL LOW (ref 8.9–10.3)
Chloride: 99 mmol/L (ref 98–111)
Creatinine: 2.12 mg/dL — ABNORMAL HIGH (ref 0.61–1.24)
GFR, Estimated: 32 mL/min — ABNORMAL LOW (ref >60)
Glucose, Bld: 239 mg/dL — ABNORMAL HIGH (ref 70–99)
Potassium: 4.1 mmol/L (ref 3.5–5.1)
Sodium: 136 mmol/L (ref 135–145)
Total Bilirubin: 0.4 mg/dL (ref 0.0–1.2)
Total Protein: 7.2 g/dL (ref 6.5–8.1)

## 2024-03-05 LAB — CBC WITH DIFFERENTIAL (CANCER CENTER ONLY)
Abs Immature Granulocytes: 0 K/uL (ref 0.00–0.07)
Basophils Absolute: 0 K/uL (ref 0.0–0.1)
Basophils Relative: 0 %
Eosinophils Absolute: 0.2 K/uL (ref 0.0–0.5)
Eosinophils Relative: 5 %
HCT: 34.6 % — ABNORMAL LOW (ref 39.0–52.0)
Hemoglobin: 11.4 g/dL — ABNORMAL LOW (ref 13.0–17.0)
Immature Granulocytes: 0 %
Lymphocytes Relative: 33 %
Lymphs Abs: 1.2 K/uL (ref 0.7–4.0)
MCH: 27.6 pg (ref 26.0–34.0)
MCHC: 32.9 g/dL (ref 30.0–36.0)
MCV: 83.8 fL (ref 80.0–100.0)
Monocytes Absolute: 0.4 K/uL (ref 0.1–1.0)
Monocytes Relative: 12 %
Neutro Abs: 1.8 K/uL (ref 1.7–7.7)
Neutrophils Relative %: 50 %
Platelet Count: 194 K/uL (ref 150–400)
RBC: 4.13 MIL/uL — ABNORMAL LOW (ref 4.22–5.81)
RDW: 13.8 % (ref 11.5–15.5)
WBC Count: 3.5 K/uL — ABNORMAL LOW (ref 4.0–10.5)
nRBC: 0 % (ref 0.0–0.2)

## 2024-03-05 LAB — IRON AND IRON BINDING CAPACITY (CC-WL,HP ONLY)
Iron: 63 ug/dL (ref 45–182)
Saturation Ratios: 22 % (ref 17.9–39.5)
TIBC: 291 ug/dL (ref 250–450)
UIBC: 228 ug/dL (ref 117–376)

## 2024-03-05 LAB — CEA (ACCESS): CEA (CHCC): 2.02 ng/mL (ref 0.00–5.00)

## 2024-03-05 LAB — FERRITIN: Ferritin: 76 ng/mL (ref 24–336)

## 2024-03-07 ENCOUNTER — Telehealth: Payer: Self-pay | Admitting: Internal Medicine

## 2024-03-07 NOTE — Telephone Encounter (Signed)
 Caller Deniece) called to report patient has gained 4 pounds fluctuating since Monday (10/27).  Caller stated patient is not having any swelling or other symptoms.  Caller stated weighted 185 pounds on Monday and today patient weighed around 190.2 pounds. Caller stated can leave secure voice message.

## 2024-03-07 NOTE — Telephone Encounter (Signed)
 Home health nurse reports weight gain of 5 lbs in 4 days.   Spoke with patient, he denies any swelling or SOB. Denies any missed doses of torsemide  20 mg daily. Denies increased intake of salt, reports he has a wonderful appetite and feels fine.  Weights: 10/30 -- 190.2 lbs 10/28 -- 192 lbs (at MD office) 10/27 -- 185 lbs   Will forward to Dr. Wendel to review.

## 2024-03-07 NOTE — Telephone Encounter (Signed)
 Spoke with Powell with [facility] and shared recommendation from Dr. Wendel to take torsemide  20 mg twice daily x3 days then back to once daily.  She will notify patient and will go back out to see him for [facility] visit next week. Powell will call if no improvement.

## 2024-03-08 ENCOUNTER — Other Ambulatory Visit: Payer: Self-pay

## 2024-03-08 DIAGNOSIS — C186 Malignant neoplasm of descending colon: Secondary | ICD-10-CM

## 2024-03-08 NOTE — Progress Notes (Signed)
 As per Powell Lessen NP, order was placed in portal successfully for a Signatera, kit was taken to lab and will be drawn 11/06. Paperwork was uploaded to the portal.

## 2024-03-14 ENCOUNTER — Inpatient Hospital Stay: Attending: Nurse Practitioner

## 2024-04-17 NOTE — Telephone Encounter (Signed)
 They have sent order for on 03/08/2024 and refax it today. Please advise if you have received the order form

## 2024-04-17 NOTE — Telephone Encounter (Signed)
 After looking through Onbase and pt's chart. Called provided number for Heather. No answer, left a message for her to refax the form. Give fax number.

## 2024-04-24 NOTE — Telephone Encounter (Signed)
 Cory [name] Howard with [facility] called in stating she is going to fax over order again today

## 2024-05-07 NOTE — Telephone Encounter (Signed)
 HH order has been faxed. Transmission states it was successful.

## 2024-05-07 NOTE — Telephone Encounter (Signed)
 Apolinar from [facility] calling to have home health orders signed and sent back and would like a c/b

## 2024-06-03 ENCOUNTER — Telehealth: Payer: Self-pay | Admitting: Hematology

## 2024-06-04 ENCOUNTER — Inpatient Hospital Stay: Admitting: Hematology

## 2024-06-04 ENCOUNTER — Inpatient Hospital Stay

## 2024-06-25 ENCOUNTER — Inpatient Hospital Stay: Attending: Nurse Practitioner

## 2024-06-25 ENCOUNTER — Inpatient Hospital Stay: Admitting: Hematology

## 2024-08-19 ENCOUNTER — Ambulatory Visit: Admitting: Endocrinology
